# Patient Record
Sex: Female | Born: 1935 | Race: White | Hispanic: No | Smoking: Never smoker
Health system: Southern US, Community
[De-identification: ages and names within clinical notes are randomized; demographics above are authoritative.]

## PROBLEM LIST (undated history)

## (undated) DIAGNOSIS — I4821 Permanent atrial fibrillation: Secondary | ICD-10-CM

## (undated) DIAGNOSIS — K2970 Gastritis, unspecified, without bleeding: Secondary | ICD-10-CM

## (undated) DIAGNOSIS — Z953 Presence of xenogenic heart valve: Secondary | ICD-10-CM

## (undated) DIAGNOSIS — I351 Nonrheumatic aortic (valve) insufficiency: Secondary | ICD-10-CM

## (undated) DIAGNOSIS — IMO0001 Reserved for inherently not codable concepts without codable children: Secondary | ICD-10-CM

## (undated) DIAGNOSIS — I714 Abdominal aortic aneurysm, without rupture, unspecified: Secondary | ICD-10-CM

## (undated) DIAGNOSIS — R079 Chest pain, unspecified: Secondary | ICD-10-CM

## (undated) DIAGNOSIS — C4490 Unspecified malignant neoplasm of skin, unspecified: Secondary | ICD-10-CM

## (undated) DIAGNOSIS — M353 Polymyalgia rheumatica: Secondary | ICD-10-CM

## (undated) DIAGNOSIS — K449 Diaphragmatic hernia without obstruction or gangrene: Secondary | ICD-10-CM

## (undated) DIAGNOSIS — B029 Zoster without complications: Secondary | ICD-10-CM

## (undated) DIAGNOSIS — I671 Cerebral aneurysm, nonruptured: Secondary | ICD-10-CM

## (undated) DIAGNOSIS — I495 Sick sinus syndrome: Secondary | ICD-10-CM

## (undated) DIAGNOSIS — I071 Rheumatic tricuspid insufficiency: Secondary | ICD-10-CM

## (undated) DIAGNOSIS — I5032 Chronic diastolic (congestive) heart failure: Secondary | ICD-10-CM

## (undated) DIAGNOSIS — Z950 Presence of cardiac pacemaker: Secondary | ICD-10-CM

## (undated) DIAGNOSIS — R569 Unspecified convulsions: Secondary | ICD-10-CM

## (undated) DIAGNOSIS — I10 Essential (primary) hypertension: Secondary | ICD-10-CM

## (undated) DIAGNOSIS — F419 Anxiety disorder, unspecified: Secondary | ICD-10-CM

## (undated) DIAGNOSIS — B1910 Unspecified viral hepatitis B without hepatic coma: Secondary | ICD-10-CM

## (undated) DIAGNOSIS — I340 Nonrheumatic mitral (valve) insufficiency: Secondary | ICD-10-CM

## (undated) DIAGNOSIS — I272 Pulmonary hypertension, unspecified: Secondary | ICD-10-CM

## (undated) DIAGNOSIS — K5790 Diverticulosis of intestine, part unspecified, without perforation or abscess without bleeding: Secondary | ICD-10-CM

## (undated) HISTORY — PX: VESICOVAGINAL FISTULA CLOSURE W/ TAH: SUR271

## (undated) HISTORY — PX: CHOLECYSTECTOMY: SHX55

## (undated) HISTORY — DX: Permanent atrial fibrillation: I48.21

## (undated) HISTORY — DX: Presence of xenogenic heart valve: Z95.3

## (undated) HISTORY — PX: ABDOMINAL AORTIC ANEURYSM REPAIR: SUR1152

## (undated) HISTORY — DX: Chronic diastolic (congestive) heart failure: I50.32

## (undated) HISTORY — DX: Unspecified viral hepatitis B without hepatic coma: B19.10

## (undated) HISTORY — DX: Abdominal aortic aneurysm, without rupture: I71.4

## (undated) HISTORY — DX: Unspecified convulsions: R56.9

## (undated) HISTORY — DX: Anxiety disorder, unspecified: F41.9

## (undated) HISTORY — DX: Polymyalgia rheumatica: M35.3

## (undated) HISTORY — PX: CEREBRAL ANEURYSM REPAIR: SHX164

## (undated) HISTORY — PX: OTHER SURGICAL HISTORY: SHX169

## (undated) HISTORY — DX: Unspecified malignant neoplasm of skin, unspecified: C44.90

## (undated) HISTORY — DX: Chest pain, unspecified: R07.9

## (undated) HISTORY — DX: Gastritis, unspecified, without bleeding: K29.70

## (undated) HISTORY — DX: Presence of cardiac pacemaker: Z95.0

## (undated) HISTORY — DX: Zoster without complications: B02.9

## (undated) HISTORY — DX: Essential (primary) hypertension: I10

## (undated) HISTORY — DX: Nonrheumatic mitral (valve) insufficiency: I34.0

## (undated) HISTORY — PX: AORTIC VALVE REPLACEMENT: SHX41

## (undated) HISTORY — DX: Abdominal aortic aneurysm, without rupture, unspecified: I71.40

## (undated) HISTORY — DX: Cerebral aneurysm, nonruptured: I67.1

## (undated) HISTORY — PX: APPENDECTOMY: SHX54

## (undated) HISTORY — DX: Pulmonary hypertension, unspecified: I27.20

## (undated) HISTORY — DX: Rheumatic tricuspid insufficiency: I07.1

## (undated) HISTORY — DX: Sick sinus syndrome: I49.5

## (undated) HISTORY — DX: Diverticulosis of intestine, part unspecified, without perforation or abscess without bleeding: K57.90

## (undated) HISTORY — DX: Nonrheumatic aortic (valve) insufficiency: I35.1

## (undated) HISTORY — DX: Diaphragmatic hernia without obstruction or gangrene: K44.9

## (undated) HISTORY — PX: BREAST BIOPSY: SHX20

## (undated) HISTORY — DX: Reserved for inherently not codable concepts without codable children: IMO0001

---

## 1979-07-19 DIAGNOSIS — B1910 Unspecified viral hepatitis B without hepatic coma: Secondary | ICD-10-CM

## 1979-07-19 HISTORY — DX: Unspecified viral hepatitis B without hepatic coma: B19.10

## 1999-09-06 ENCOUNTER — Encounter: Payer: Self-pay | Admitting: Gastroenterology

## 2000-10-15 ENCOUNTER — Encounter: Payer: Self-pay | Admitting: Gastroenterology

## 2003-11-18 HISTORY — PX: LUNG BIOPSY: SHX232

## 2003-12-13 ENCOUNTER — Encounter: Payer: Self-pay | Admitting: Gastroenterology

## 2004-05-21 ENCOUNTER — Encounter: Payer: Self-pay | Admitting: Gastroenterology

## 2005-02-14 ENCOUNTER — Ambulatory Visit (HOSPITAL_COMMUNITY): Admission: RE | Admit: 2005-02-14 | Discharge: 2005-02-14 | Payer: Self-pay | Admitting: Unknown Physician Specialty

## 2006-05-28 ENCOUNTER — Encounter: Payer: Self-pay | Admitting: Gastroenterology

## 2007-07-26 ENCOUNTER — Ambulatory Visit: Admission: RE | Admit: 2007-07-26 | Discharge: 2007-07-26 | Payer: Self-pay | Admitting: Dermatology

## 2007-08-05 ENCOUNTER — Encounter (HOSPITAL_BASED_OUTPATIENT_CLINIC_OR_DEPARTMENT_OTHER): Admission: RE | Admit: 2007-08-05 | Discharge: 2007-08-17 | Payer: Self-pay | Admitting: Surgery

## 2007-08-18 ENCOUNTER — Ambulatory Visit: Payer: Self-pay | Admitting: Cardiology

## 2007-08-18 ENCOUNTER — Inpatient Hospital Stay (HOSPITAL_COMMUNITY): Admission: EM | Admit: 2007-08-18 | Discharge: 2007-08-23 | Payer: Self-pay | Admitting: Emergency Medicine

## 2007-08-18 HISTORY — PX: PACEMAKER PLACEMENT: SHX43

## 2007-08-20 ENCOUNTER — Ambulatory Visit: Payer: Self-pay | Admitting: Cardiology

## 2007-08-20 ENCOUNTER — Encounter (HOSPITAL_BASED_OUTPATIENT_CLINIC_OR_DEPARTMENT_OTHER): Admission: RE | Admit: 2007-08-20 | Discharge: 2007-11-18 | Payer: Self-pay | Admitting: Surgery

## 2007-08-20 ENCOUNTER — Encounter (INDEPENDENT_AMBULATORY_CARE_PROVIDER_SITE_OTHER): Payer: Self-pay | Admitting: Internal Medicine

## 2007-08-25 ENCOUNTER — Ambulatory Visit: Payer: Self-pay

## 2007-08-30 ENCOUNTER — Ambulatory Visit: Payer: Self-pay | Admitting: Internal Medicine

## 2007-09-06 ENCOUNTER — Ambulatory Visit: Payer: Self-pay | Admitting: Internal Medicine

## 2007-09-13 ENCOUNTER — Ambulatory Visit: Payer: Self-pay | Admitting: Cardiology

## 2007-09-13 ENCOUNTER — Inpatient Hospital Stay (HOSPITAL_COMMUNITY): Admission: EM | Admit: 2007-09-13 | Discharge: 2007-09-17 | Payer: Self-pay | Admitting: Emergency Medicine

## 2007-09-24 ENCOUNTER — Ambulatory Visit: Payer: Self-pay

## 2007-09-25 ENCOUNTER — Encounter: Payer: Self-pay | Admitting: Cardiology

## 2007-09-25 LAB — CONVERTED CEMR LAB
Calcium: 8.8 mg/dL (ref 8.4–10.5)
Glucose, Bld: 58 mg/dL — ABNORMAL LOW (ref 70–99)
Potassium: 4.3 meq/L (ref 3.5–5.3)
Sodium: 142 meq/L (ref 135–145)

## 2007-09-27 ENCOUNTER — Ambulatory Visit: Payer: Self-pay | Admitting: Cardiology

## 2007-10-05 ENCOUNTER — Ambulatory Visit: Payer: Self-pay

## 2007-10-08 ENCOUNTER — Ambulatory Visit: Payer: Self-pay

## 2007-10-18 ENCOUNTER — Ambulatory Visit: Payer: Self-pay | Admitting: Cardiology

## 2007-10-29 ENCOUNTER — Ambulatory Visit: Payer: Self-pay | Admitting: Internal Medicine

## 2007-10-30 ENCOUNTER — Emergency Department (HOSPITAL_COMMUNITY): Admission: EM | Admit: 2007-10-30 | Discharge: 2007-10-30 | Payer: Self-pay | Admitting: Emergency Medicine

## 2007-11-02 ENCOUNTER — Ambulatory Visit: Payer: Self-pay | Admitting: Internal Medicine

## 2007-11-15 ENCOUNTER — Ambulatory Visit: Payer: Self-pay

## 2007-11-22 ENCOUNTER — Ambulatory Visit: Payer: Self-pay | Admitting: Cardiology

## 2007-11-22 ENCOUNTER — Ambulatory Visit: Payer: Self-pay | Admitting: Gastroenterology

## 2007-11-26 ENCOUNTER — Ambulatory Visit: Payer: Self-pay | Admitting: Gastroenterology

## 2007-11-26 ENCOUNTER — Encounter: Payer: Self-pay | Admitting: Gastroenterology

## 2007-11-30 ENCOUNTER — Ambulatory Visit (HOSPITAL_COMMUNITY): Admission: RE | Admit: 2007-11-30 | Discharge: 2007-11-30 | Payer: Self-pay | Admitting: Gastroenterology

## 2007-12-06 ENCOUNTER — Ambulatory Visit: Payer: Self-pay | Admitting: Cardiology

## 2007-12-10 ENCOUNTER — Ambulatory Visit: Payer: Self-pay | Admitting: Internal Medicine

## 2007-12-14 ENCOUNTER — Ambulatory Visit (HOSPITAL_COMMUNITY): Admission: RE | Admit: 2007-12-14 | Discharge: 2007-12-14 | Payer: Self-pay | Admitting: Urology

## 2007-12-17 ENCOUNTER — Ambulatory Visit: Payer: Self-pay | Admitting: Internal Medicine

## 2007-12-20 ENCOUNTER — Ambulatory Visit: Payer: Self-pay | Admitting: Cardiology

## 2007-12-22 ENCOUNTER — Ambulatory Visit: Payer: Self-pay | Admitting: Cardiology

## 2007-12-27 ENCOUNTER — Ambulatory Visit: Payer: Self-pay | Admitting: Gastroenterology

## 2008-01-05 ENCOUNTER — Ambulatory Visit: Payer: Self-pay | Admitting: Cardiology

## 2008-01-14 ENCOUNTER — Ambulatory Visit: Payer: Self-pay | Admitting: Cardiology

## 2008-01-18 ENCOUNTER — Ambulatory Visit: Payer: Self-pay | Admitting: Cardiology

## 2008-01-31 ENCOUNTER — Ambulatory Visit: Payer: Self-pay | Admitting: Internal Medicine

## 2008-02-04 ENCOUNTER — Ambulatory Visit: Payer: Self-pay | Admitting: Cardiology

## 2008-02-17 ENCOUNTER — Ambulatory Visit: Payer: Self-pay | Admitting: Cardiology

## 2008-03-17 ENCOUNTER — Ambulatory Visit: Payer: Self-pay | Admitting: Cardiology

## 2008-03-31 ENCOUNTER — Ambulatory Visit: Payer: Self-pay | Admitting: Cardiology

## 2008-04-14 ENCOUNTER — Ambulatory Visit: Payer: Self-pay | Admitting: Cardiology

## 2008-04-19 ENCOUNTER — Encounter (HOSPITAL_BASED_OUTPATIENT_CLINIC_OR_DEPARTMENT_OTHER): Payer: Self-pay | Admitting: General Surgery

## 2008-04-19 ENCOUNTER — Ambulatory Visit (HOSPITAL_COMMUNITY): Admission: RE | Admit: 2008-04-19 | Discharge: 2008-04-19 | Payer: Self-pay | Admitting: General Surgery

## 2008-05-16 ENCOUNTER — Ambulatory Visit: Payer: Self-pay | Admitting: Cardiology

## 2008-06-01 ENCOUNTER — Ambulatory Visit: Payer: Self-pay | Admitting: Cardiology

## 2008-06-01 LAB — CONVERTED CEMR LAB
BUN: 14 mg/dL (ref 6–23)
CO2: 23 meq/L (ref 19–32)
Chloride: 98 meq/L (ref 96–112)
Eosinophils Relative: 1 % (ref 0–5)
HCT: 39.3 % (ref 36.0–46.0)
INR: 2.7 — ABNORMAL HIGH (ref 0.0–1.5)
Lymphocytes Relative: 22 % (ref 12–46)
Lymphs Abs: 0.9 10*3/uL (ref 0.7–4.0)
MCV: 88.1 fL (ref 78.0–100.0)
Monocytes Relative: 11 % (ref 3–12)
Platelets: 156 10*3/uL (ref 150–400)
Potassium: 4.6 meq/L (ref 3.5–5.3)
Prothrombin Time: 30.9 s — ABNORMAL HIGH (ref 11.6–15.2)
RBC: 4.46 M/uL (ref 3.87–5.11)
WBC: 3.9 10*3/uL — ABNORMAL LOW (ref 4.0–10.5)

## 2008-06-07 ENCOUNTER — Ambulatory Visit: Payer: Self-pay

## 2008-06-13 ENCOUNTER — Ambulatory Visit: Payer: Self-pay | Admitting: Cardiology

## 2008-06-26 ENCOUNTER — Ambulatory Visit: Payer: Self-pay | Admitting: Cardiology

## 2008-07-10 ENCOUNTER — Ambulatory Visit: Payer: Self-pay | Admitting: Internal Medicine

## 2008-07-21 ENCOUNTER — Ambulatory Visit: Payer: Self-pay | Admitting: Cardiology

## 2008-08-02 ENCOUNTER — Other Ambulatory Visit: Payer: Self-pay

## 2008-08-02 ENCOUNTER — Inpatient Hospital Stay: Payer: Self-pay | Admitting: Internal Medicine

## 2008-08-02 ENCOUNTER — Ambulatory Visit: Payer: Self-pay | Admitting: Cardiovascular Disease

## 2008-08-15 ENCOUNTER — Ambulatory Visit: Payer: Self-pay | Admitting: Cardiology

## 2008-08-28 ENCOUNTER — Ambulatory Visit: Payer: Self-pay | Admitting: Internal Medicine

## 2008-09-11 ENCOUNTER — Ambulatory Visit: Payer: Self-pay | Admitting: Cardiology

## 2008-09-14 ENCOUNTER — Ambulatory Visit: Payer: Self-pay | Admitting: Cardiovascular Disease

## 2008-09-18 ENCOUNTER — Ambulatory Visit: Payer: Self-pay | Admitting: Cardiology

## 2008-10-02 ENCOUNTER — Ambulatory Visit: Payer: Self-pay | Admitting: Internal Medicine

## 2008-10-30 ENCOUNTER — Ambulatory Visit: Payer: Self-pay | Admitting: Internal Medicine

## 2008-11-06 ENCOUNTER — Ambulatory Visit: Payer: Self-pay | Admitting: Internal Medicine

## 2008-11-20 ENCOUNTER — Ambulatory Visit: Payer: Self-pay | Admitting: Cardiovascular Disease

## 2008-12-04 ENCOUNTER — Ambulatory Visit: Payer: Self-pay | Admitting: Cardiovascular Disease

## 2008-12-11 ENCOUNTER — Ambulatory Visit: Payer: Self-pay | Admitting: Internal Medicine

## 2008-12-19 ENCOUNTER — Ambulatory Visit: Payer: Self-pay | Admitting: Cardiology

## 2009-01-03 ENCOUNTER — Ambulatory Visit: Payer: Self-pay | Admitting: Internal Medicine

## 2009-01-15 ENCOUNTER — Ambulatory Visit: Payer: Self-pay | Admitting: Cardiovascular Disease

## 2009-01-26 ENCOUNTER — Encounter: Admission: RE | Admit: 2009-01-26 | Discharge: 2009-01-26 | Payer: Self-pay | Admitting: Obstetrics & Gynecology

## 2009-02-06 ENCOUNTER — Ambulatory Visit: Payer: Self-pay | Admitting: Internal Medicine

## 2009-02-22 ENCOUNTER — Encounter: Payer: Self-pay | Admitting: Internal Medicine

## 2009-02-27 ENCOUNTER — Ambulatory Visit: Payer: Self-pay | Admitting: Cardiovascular Disease

## 2009-03-13 ENCOUNTER — Ambulatory Visit: Payer: Self-pay | Admitting: Internal Medicine

## 2009-03-23 ENCOUNTER — Encounter (INDEPENDENT_AMBULATORY_CARE_PROVIDER_SITE_OTHER): Payer: Self-pay | Admitting: *Deleted

## 2009-03-27 ENCOUNTER — Ambulatory Visit: Payer: Self-pay | Admitting: Cardiovascular Disease

## 2009-04-02 ENCOUNTER — Telehealth: Payer: Self-pay | Admitting: Cardiology

## 2009-04-17 ENCOUNTER — Telehealth: Payer: Self-pay | Admitting: Cardiology

## 2009-04-17 ENCOUNTER — Ambulatory Visit: Payer: Self-pay | Admitting: Cardiovascular Disease

## 2009-04-23 ENCOUNTER — Ambulatory Visit: Payer: Self-pay | Admitting: Internal Medicine

## 2009-04-26 ENCOUNTER — Telehealth: Payer: Self-pay | Admitting: Internal Medicine

## 2009-04-26 ENCOUNTER — Emergency Department (HOSPITAL_COMMUNITY): Admission: EM | Admit: 2009-04-26 | Discharge: 2009-04-27 | Payer: Self-pay | Admitting: Emergency Medicine

## 2009-05-07 ENCOUNTER — Ambulatory Visit: Payer: Self-pay | Admitting: Internal Medicine

## 2009-05-22 ENCOUNTER — Ambulatory Visit: Payer: Self-pay | Admitting: Cardiovascular Disease

## 2009-06-19 ENCOUNTER — Ambulatory Visit: Payer: Self-pay | Admitting: Cardiology

## 2009-06-19 DIAGNOSIS — I4891 Unspecified atrial fibrillation: Secondary | ICD-10-CM

## 2009-06-19 DIAGNOSIS — R42 Dizziness and giddiness: Secondary | ICD-10-CM

## 2009-06-27 ENCOUNTER — Encounter: Payer: Self-pay | Admitting: Cardiology

## 2009-07-02 ENCOUNTER — Encounter: Payer: Self-pay | Admitting: *Deleted

## 2009-07-03 ENCOUNTER — Ambulatory Visit: Payer: Self-pay | Admitting: Cardiology

## 2009-07-03 LAB — CONVERTED CEMR LAB: Prothrombin Time: 17.9 s

## 2009-07-17 ENCOUNTER — Ambulatory Visit: Payer: Self-pay | Admitting: Cardiology

## 2009-07-17 ENCOUNTER — Ambulatory Visit: Payer: Self-pay

## 2009-07-17 DIAGNOSIS — I739 Peripheral vascular disease, unspecified: Secondary | ICD-10-CM | POA: Insufficient documentation

## 2009-08-14 ENCOUNTER — Ambulatory Visit: Payer: Self-pay | Admitting: Cardiovascular Disease

## 2009-08-14 LAB — CONVERTED CEMR LAB: POC INR: 2.6

## 2009-09-11 ENCOUNTER — Ambulatory Visit: Payer: Self-pay | Admitting: Cardiology

## 2009-09-11 LAB — CONVERTED CEMR LAB: POC INR: 2.2

## 2009-10-05 ENCOUNTER — Ambulatory Visit: Payer: Self-pay | Admitting: Cardiovascular Disease

## 2009-10-05 ENCOUNTER — Telehealth: Payer: Self-pay | Admitting: Cardiology

## 2009-10-05 ENCOUNTER — Encounter: Payer: Self-pay | Admitting: Cardiology

## 2009-10-09 ENCOUNTER — Ambulatory Visit: Payer: Self-pay | Admitting: Internal Medicine

## 2009-10-09 LAB — CONVERTED CEMR LAB: Pro B Natriuretic peptide (BNP): 187.8 pg/mL — ABNORMAL HIGH (ref 0.0–100.0)

## 2009-10-26 ENCOUNTER — Ambulatory Visit: Payer: Self-pay | Admitting: Internal Medicine

## 2009-10-26 DIAGNOSIS — I5032 Chronic diastolic (congestive) heart failure: Secondary | ICD-10-CM

## 2009-11-01 ENCOUNTER — Encounter: Payer: Self-pay | Admitting: Internal Medicine

## 2009-11-06 ENCOUNTER — Ambulatory Visit: Payer: Self-pay | Admitting: Cardiovascular Disease

## 2009-12-05 ENCOUNTER — Ambulatory Visit: Payer: Self-pay | Admitting: Cardiology

## 2010-01-02 ENCOUNTER — Ambulatory Visit: Payer: Self-pay | Admitting: Cardiovascular Disease

## 2010-01-15 ENCOUNTER — Ambulatory Visit: Payer: Self-pay | Admitting: Cardiovascular Disease

## 2010-01-15 DIAGNOSIS — Z953 Presence of xenogenic heart valve: Secondary | ICD-10-CM | POA: Insufficient documentation

## 2010-01-15 DIAGNOSIS — R011 Cardiac murmur, unspecified: Secondary | ICD-10-CM

## 2010-01-31 ENCOUNTER — Encounter: Payer: Self-pay | Admitting: Cardiovascular Disease

## 2010-01-31 ENCOUNTER — Ambulatory Visit: Payer: Self-pay

## 2010-01-31 ENCOUNTER — Ambulatory Visit: Payer: Self-pay | Admitting: Cardiovascular Disease

## 2010-01-31 LAB — CONVERTED CEMR LAB: POC INR: 3.3

## 2010-02-01 LAB — CONVERTED CEMR LAB
ALT: 15 units/L (ref 0–35)
AST: 24 units/L (ref 0–37)
Albumin: 4.3 g/dL (ref 3.5–5.2)
Calcium: 9.1 mg/dL (ref 8.4–10.5)
Chloride: 97 meq/L (ref 96–112)
Potassium: 3.8 meq/L (ref 3.5–5.3)
Total Protein: 7 g/dL (ref 6.0–8.3)

## 2010-02-28 ENCOUNTER — Ambulatory Visit: Payer: Self-pay | Admitting: Cardiology

## 2010-02-28 LAB — CONVERTED CEMR LAB: POC INR: 2

## 2010-03-13 ENCOUNTER — Encounter: Admission: RE | Admit: 2010-03-13 | Discharge: 2010-03-13 | Payer: Self-pay | Admitting: Obstetrics & Gynecology

## 2010-03-28 ENCOUNTER — Ambulatory Visit: Payer: Self-pay | Admitting: Cardiovascular Disease

## 2010-03-28 LAB — CONVERTED CEMR LAB: POC INR: 2.5

## 2010-04-24 ENCOUNTER — Telehealth: Payer: Self-pay | Admitting: Cardiovascular Disease

## 2010-04-30 ENCOUNTER — Ambulatory Visit: Payer: Self-pay | Admitting: Cardiovascular Disease

## 2010-05-07 ENCOUNTER — Ambulatory Visit: Payer: Self-pay | Admitting: Internal Medicine

## 2010-05-07 DIAGNOSIS — Z950 Presence of cardiac pacemaker: Secondary | ICD-10-CM | POA: Insufficient documentation

## 2010-05-08 ENCOUNTER — Encounter: Payer: Self-pay | Admitting: Internal Medicine

## 2010-05-21 ENCOUNTER — Ambulatory Visit: Payer: Self-pay

## 2010-05-21 ENCOUNTER — Encounter: Payer: Self-pay | Admitting: Internal Medicine

## 2010-05-27 ENCOUNTER — Telehealth: Payer: Self-pay | Admitting: Cardiovascular Disease

## 2010-05-28 ENCOUNTER — Encounter: Payer: Self-pay | Admitting: Cardiovascular Disease

## 2010-05-29 ENCOUNTER — Ambulatory Visit: Payer: Self-pay | Admitting: Cardiovascular Disease

## 2010-05-29 LAB — CONVERTED CEMR LAB: POC INR: 2.5

## 2010-05-31 ENCOUNTER — Encounter: Payer: Self-pay | Admitting: Cardiovascular Disease

## 2010-06-04 ENCOUNTER — Encounter: Payer: Self-pay | Admitting: Cardiovascular Disease

## 2010-06-04 ENCOUNTER — Ambulatory Visit (HOSPITAL_COMMUNITY): Admission: RE | Admit: 2010-06-04 | Discharge: 2010-06-04 | Payer: Self-pay | Admitting: Cardiovascular Disease

## 2010-06-26 ENCOUNTER — Ambulatory Visit: Payer: Self-pay | Admitting: Cardiovascular Disease

## 2010-06-26 LAB — CONVERTED CEMR LAB: POC INR: 2.3

## 2010-07-23 ENCOUNTER — Encounter: Payer: Self-pay | Admitting: Cardiovascular Disease

## 2010-07-23 ENCOUNTER — Ambulatory Visit: Payer: Self-pay | Admitting: Cardiovascular Disease

## 2010-07-23 LAB — CONVERTED CEMR LAB: POC INR: 4.2

## 2010-07-29 ENCOUNTER — Ambulatory Visit: Payer: Self-pay | Admitting: Cardiovascular Disease

## 2010-08-21 ENCOUNTER — Ambulatory Visit: Payer: Self-pay | Admitting: Cardiovascular Disease

## 2010-08-30 ENCOUNTER — Telehealth: Payer: Self-pay | Admitting: Cardiovascular Disease

## 2010-09-18 ENCOUNTER — Ambulatory Visit: Payer: Self-pay | Admitting: Cardiovascular Disease

## 2010-09-20 ENCOUNTER — Telehealth: Payer: Self-pay | Admitting: Cardiovascular Disease

## 2010-10-18 ENCOUNTER — Encounter: Payer: Self-pay | Admitting: Internal Medicine

## 2010-10-18 ENCOUNTER — Ambulatory Visit: Payer: Self-pay | Admitting: Internal Medicine

## 2010-10-18 LAB — CONVERTED CEMR LAB: POC INR: 2

## 2010-11-12 ENCOUNTER — Ambulatory Visit: Payer: Self-pay | Admitting: Cardiovascular Disease

## 2010-11-12 LAB — CONVERTED CEMR LAB: POC INR: 2.1

## 2010-12-08 ENCOUNTER — Encounter: Payer: Self-pay | Admitting: Obstetrics & Gynecology

## 2010-12-11 ENCOUNTER — Ambulatory Visit: Admission: RE | Admit: 2010-12-11 | Discharge: 2010-12-11 | Payer: Self-pay | Source: Home / Self Care

## 2010-12-11 LAB — CONVERTED CEMR LAB: POC INR: 1.9

## 2010-12-15 LAB — CONVERTED CEMR LAB: Prothrombin Time: 20.2 s

## 2010-12-17 ENCOUNTER — Inpatient Hospital Stay (HOSPITAL_COMMUNITY)
Admission: EM | Admit: 2010-12-17 | Discharge: 2010-12-19 | DRG: 069 | Disposition: A | Discharge status: Home Health | Payer: Medicare Other | Attending: Family Medicine | Admitting: Family Medicine

## 2010-12-17 DIAGNOSIS — Z7901 Long term (current) use of anticoagulants: Secondary | ICD-10-CM

## 2010-12-17 DIAGNOSIS — I714 Abdominal aortic aneurysm, without rupture, unspecified: Secondary | ICD-10-CM | POA: Diagnosis present

## 2010-12-17 DIAGNOSIS — I509 Heart failure, unspecified: Secondary | ICD-10-CM | POA: Diagnosis present

## 2010-12-17 DIAGNOSIS — Z950 Presence of cardiac pacemaker: Secondary | ICD-10-CM

## 2010-12-17 DIAGNOSIS — I5032 Chronic diastolic (congestive) heart failure: Secondary | ICD-10-CM | POA: Diagnosis present

## 2010-12-17 DIAGNOSIS — I10 Essential (primary) hypertension: Secondary | ICD-10-CM | POA: Diagnosis present

## 2010-12-17 DIAGNOSIS — G459 Transient cerebral ischemic attack, unspecified: Principal | ICD-10-CM | POA: Diagnosis present

## 2010-12-17 DIAGNOSIS — R4789 Other speech disturbances: Secondary | ICD-10-CM | POA: Diagnosis present

## 2010-12-17 DIAGNOSIS — K219 Gastro-esophageal reflux disease without esophagitis: Secondary | ICD-10-CM | POA: Diagnosis present

## 2010-12-17 DIAGNOSIS — R29810 Facial weakness: Secondary | ICD-10-CM | POA: Diagnosis present

## 2010-12-17 DIAGNOSIS — I4891 Unspecified atrial fibrillation: Secondary | ICD-10-CM | POA: Diagnosis present

## 2010-12-17 DIAGNOSIS — I83009 Varicose veins of unspecified lower extremity with ulcer of unspecified site: Secondary | ICD-10-CM | POA: Diagnosis present

## 2010-12-17 DIAGNOSIS — Z8619 Personal history of other infectious and parasitic diseases: Secondary | ICD-10-CM

## 2010-12-17 DIAGNOSIS — I059 Rheumatic mitral valve disease, unspecified: Secondary | ICD-10-CM | POA: Diagnosis present

## 2010-12-17 LAB — COMPREHENSIVE METABOLIC PANEL
AST: 29 U/L (ref 0–37)
Albumin: 3.7 g/dL (ref 3.5–5.2)
Alkaline Phosphatase: 101 U/L (ref 39–117)
BUN: 22 mg/dL (ref 6–23)
Chloride: 100 mEq/L (ref 96–112)
Potassium: 5 mEq/L (ref 3.5–5.1)
Total Bilirubin: 0.6 mg/dL (ref 0.3–1.2)

## 2010-12-17 LAB — URINALYSIS, ROUTINE W REFLEX MICROSCOPIC
Bilirubin Urine: NEGATIVE
Hgb urine dipstick: NEGATIVE
Ketones, ur: NEGATIVE mg/dL
Protein, ur: NEGATIVE mg/dL
Urine Glucose, Fasting: NEGATIVE mg/dL

## 2010-12-17 LAB — PROTIME-INR: INR: 2.46 — ABNORMAL HIGH (ref 0.00–1.49)

## 2010-12-17 LAB — CBC
HCT: 41.8 % (ref 36.0–46.0)
Hemoglobin: 13.6 g/dL (ref 12.0–15.0)
MCHC: 32.5 g/dL (ref 30.0–36.0)

## 2010-12-17 LAB — URINE MICROSCOPIC-ADD ON

## 2010-12-17 NOTE — Medication Information (Signed)
Summary: ccr/at  Anticoagulant Therapy  Managed by: Cloyde Reams, RN, BSN Referring MD: Sherryl Manges PCP: Victorino December MD: Mariah Milling Indication 1: Aortic Valve Replacement (ICD-V43.3) Indication 2: Atrial Fibrillation (ICD-427.31) Lab Used: [facility] Site: [place] INR RANGE 2.0-3.0  Dietary changes: no     Bleeding/hemorrhagic complications: no     Any changes in medication regimen? yes       Details: zpack finished 6/10   Any missed doses?: no       Is patient compliant with meds? yes       Allergies: 1)  ! Iodine 2)  ! Pcn 3)  ! * Latex Tape 4)  ! Sulfa 5)  ! Verapamil 6)  ! * Diltiazem 7)  ! Lasix 8)  ! * Advair  Anticoagulation Management History:      The patient is taking warfarin and comes in today for a routine follow up visit.  Positive risk factors for bleeding include an age of 75 years or older.  The bleeding index is 'intermediate risk'.  Positive CHADS2 values include History of CHF and Age > 75 years old.  The start date was 08/19/2007.  Her last INR was 2.7.  Anticoagulation responsible provider: Anson Peddie.  Cuvette Lot#: [id number].  Exp: 07/2011.    Anticoagulation Management Assessment/Plan:      The patient's current anticoagulation dose is Warfarin sodium 5 mg tabs: Take 1 tablet by mouth once a day.  The target INR is 2 - 3.  The next INR is due 05/28/2010.  Anticoagulation instructions were given to patient.  Results were reviewed/authorized by Cloyde Reams, RN, BSN.  She was notified by Cloyde Reams RN.         Prior Anticoagulation Instructions: INR 2.5  Continue on same dosage 2.5mg  daily except 5mg  on Mondays, Fridays, and Saturdays.  Recheck in 4 weeks.    Current Anticoagulation Instructions: INR 2.3  Continue taking 0.5 tab daily except 1 tab on Monday Friday and Saturday. Recheck in 4 weeks

## 2010-12-17 NOTE — Progress Notes (Signed)
Summary: verapamil for insurance  Phone Note Call from Patient   Caller: Patient Call For: [phone] Summary of Call: Needs notes where she was taking Verapamil from 2008 - 2009 for insurance purposes. Initial call taken by: Bishop Dublin, CMA,  August 30, 2010 2:51 PM  Follow-up for Phone Call        Notified patient records regarding verapamil are ready for pick up.  Told she needs to sign a medical release first. Follow-up by: Bishop Dublin, CMA,  September 06, 2010 9:16 AM

## 2010-12-17 NOTE — Medication Information (Signed)
Summary: rov/ewj  Anticoagulant Therapy  Managed by: Cloyde Reams, RN, BSN Referring MD: Sherryl Manges PCP: Victorino December MD: Mariah Milling Indication 1: Aortic Valve Replacement (ICD-V43.3) Indication 2: Atrial Fibrillation (ICD-427.31) Lab Used: [facility] Site: [place] INR POC 2.6 INR RANGE 2.0-3.0  Dietary changes: no    Health status changes: no    Bleeding/hemorrhagic complications: no    Recent/future hospitalizations: no    Any changes in medication regimen? no    Recent/future dental: no  Any missed doses?: no       Is patient compliant with meds? yes       Allergies: 1)  ! Iodine 2)  ! Pcn 3)  ! * Latex Tape 4)  ! Sulfa 5)  ! Verapamil 6)  ! * Diltiazem 7)  ! Lasix 8)  ! * Advair  Anticoagulation Management History:      The patient is taking warfarin and comes in today for a routine follow up visit.  Positive risk factors for bleeding include an age of 75 years or older.  The bleeding index is 'intermediate risk'.  Positive CHADS2 values include History of CHF and Age > 74 years old.  The start date was 08/19/2007.  Her last INR was 2.7.  Anticoagulation responsible provider: Gollan.  INR POC: 2.6.  Cuvette Lot#: [id number].  Exp: 08/2011.    Anticoagulation Management Assessment/Plan:      The patient's current anticoagulation dose is Warfarin sodium 5 mg tabs: as directed.  The target INR is 2 - 3.  The next INR is due 10/16/2010.  Anticoagulation instructions were given to patient.  Results were reviewed/authorized by Cloyde Reams, RN, BSN.  She was notified by Cloyde Reams RN.         Prior Anticoagulation Instructions: INR 2.1  Continue on same dosage 1/2 tablet daily except 1 tablet on Mondays and Saturdays.  Recheck in 4 weeks.   Current Anticoagulation Instructions: INR 2.6  Continue on same dosage 1/2 tablet daily except 1 tablet on Mondays and Saturdays.  Recheck in 4 weeks.

## 2010-12-17 NOTE — Medication Information (Signed)
Summary: CCR/AMD  Anticoagulant Therapy  Managed by: Robyn Haber, RN, BSN Referring MD: Sherryl Manges PCP: Victorino December MD: Shirlee Latch MD, [place] Indication 1: Aortic Valve Replacement (ICD-V43.3) Indication 2: Atrial Fibrillation (ICD-427.31) Lab Used: [facility] Site: [place] INR POC 2.1  Dietary changes: no    Health status changes: no    Bleeding/hemorrhagic complications: no    Recent/future hospitalizations: no    Any changes in medication regimen? no    Recent/future dental: no  Any missed doses?: no       Is patient compliant with meds? yes       Allergies: 1)  ! Iodine 2)  ! Pcn 3)  ! * Latex Tape 4)  ! Sulfa 5)  ! Verapamil 6)  ! * Diltiazem 7)  ! Lasix 8)  ! * Advair  Anticoagulation Management History:      The patient is taking warfarin and comes in today for a routine follow up visit.  Positive risk factors for bleeding include an age of 75 years or older.  The bleeding index is 'intermediate risk'.  Positive CHADS2 values include History of CHF and Age > 63 years old.  The start date was 08/19/2007.  Her last INR was 2.7.  Anticoagulation responsible provider: Shirlee Latch MD, [place].  INR POC: 2.1.    Anticoagulation Management Assessment/Plan:      The patient's current anticoagulation dose is Warfarin sodium 5 mg tabs: Take 1 tablet by mouth once a day.  The target INR is 2 - 3.  The next INR is due 01/02/2010.  Anticoagulation instructions were given to patient.  Results were reviewed/authorized by Robyn Haber, RN, BSN.  She was notified by Charlena Cross, RN, BSN.         Prior Anticoagulation Instructions: The patient is to continue with the same dose of coumadin.  This dosage includes: coumadin 2.5 mg daily with 5 mg on M F Sa  Current Anticoagulation Instructions: Same as Prior Instructions.

## 2010-12-17 NOTE — Assessment & Plan Note (Signed)
Summary: [name]   Visit Type:  Follow-up Referring Provider:  wall Primary Provider:  Ellsworth Lennox  CC:  c/o shortness of breath for several weeks.[name]  History of Present Illness: 77 yo WF with history of tachy-brady syndrome s/p pacemaker, Permanent atrial fibrillation, HTN, AVR, AAA with repair 4-5 years ago, and chronic diastolic heart failure.  Ultrasound from last year showing a significant decrease in her right brachial arterial pressure compared to the left.  She denies any significant lower extremity edema, no significant shortness of breath. her gait is unsteady and she uses a cane. She otherwise feels well with no new symptoms of lightheadedness. she does not exercise very much. She did participate in water aerobics for 20 years but has not been doing this recently.  cardiac catheterization over 5 years ago showing no significant coronary artery disease,  recent echo showing mild aortic stenosis with intact aortic valve, bioprosthetic valve, mild pulmonary hypertension  recent CT scan showing stable descending aorta and after repair, minimal dilatation of the descending aorta, stable-appearing aortic valve.  EKG shows atrial fibrillation with PVCs, biventricular block  Current Medications (verified): 1)  Digoxin 0.125 Mg Tabs (Digoxin) .... Take One Tablet By Mouth Daily 2)  Warfarin Sodium 5 Mg Tabs (Warfarin Sodium) .... As Directed 3)  Atenolol 100 Mg Tabs (Atenolol) .... Take One Tablet By Mouth A Day 4)  Omeprazole 40 Mg Cpdr (Omeprazole) .[name].. 1 By Mouth Two Times A Day 5)  Triamterene-Hctz 37.5-25 Mg Tabs (Triamterene-Hctz) .... Once Daily 6)  Lorazepam 0.5 Mg Tabs (Lorazepam) .... Three Times A Day As Needed 7)  Eye Vitamins  Caps (Multiple Vitamins-Minerals) .... Once Daily  Allergies (verified): 1)  ! Iodine 2)  ! Pcn 3)  ! * Latex Tape 4)  ! Sulfa 5)  ! Verapamil 6)  ! * Diltiazem 7)  ! Lasix 8)  ! * Advair  Past History:  Past Medical History: Last  updated: 01/22/2010  1. Severe bradycardia in the setting of permanent atrial fibrillation       with tachybrady syndrome status post single-chamber Medtronic       pacemaker placement October of 2008.   2. History of brain aneurysm.   3. Hypertension.   4. Thoracic aneurysm.   5. AAA status post resection.   6. Aortic valve replacement with bioprosthetic valve in the [facility] prior to moving here.   7. The patient also has seizures.   8. Diastolic heart failure   Diverticulosis Hemorrhoids Gastritis  Past Surgical History: Last updated: 01/22/2010  1. Cerebral aneurysm repair.   2. AAA resection.   3. Aortic valve replacement with bioprosthetic valve and also       Medtronic pacemaker placed October of 2008. 1. Atrial fibrillation - Permanent.   2. Status post pacemaker for bradycardia with now well controlled       ventricular response.   3. Digoxin therapy, question level.   4. Lower extremity venous issues. Breast Biopsy Cholecystectomy Hysterectomy  Family History: Last updated: 12/06/2008 Unremarkable  Social History: Last updated: 12/06/2008 Retired  Married  Tobacco Use - No.  Alcohol Use - no  Risk Factors: Smoking Status: never (12/06/2008)  Review of Systems       The patient complains of difficulty walking.  The patient denies fever, weight loss, weight gain, vision loss, decreased hearing, hoarseness, chest pain, syncope, dyspnea on exertion, peripheral edema, prolonged cough, abdominal pain, incontinence, muscle weakness, depression, and enlarged lymph nodes.  Vital Signs:  Patient profile:   77 year old female Height:      64 inches Weight:      147 pounds BMI:     25.32 Pulse rate:   84 / minute BP sitting:   131 / 72  (left arm) Cuff size:   regular  Vitals Entered By: Bishop Dublin, CMA (July 23, 2010 2:45 PM)  Physical Exam  General:  The patient was alert and oriented in no acute distress. HEENT Normal.  Neck  veins were flat, carotids were brisk.  Lungs were clear.  Heart sounds were regular with2/6 murmur Abdomen was soft with active bowel sounds And a reducible,abdominal wall hernia There is no clubbing cyanosis or edema. Skin Warm and dry  Neurologic:  Alert and oriented x 3. Psych:  Normal affect.   PPM Specifications Following MD:  Sherryl Manges, MD     PPM Vendor:  Medtronic     PPM Model Number:  [id number]     PPM Serial Number:  [id number] PPM DOI:  08/20/2007      Lead 1    Location: RV     DOI: 08/20/2007     Model #: [id number]     Serial #: [id number]     Status: active   Indications:  TACHY/BRADY SYNDROME AFIB   PPM Follow Up Pacer Dependent:  No      Episodes Coumadin:  Yes  Parameters Mode:  VVIR     Lower Rate Limit:  60     Impression & Recommendations:  Problem # 1:  AORTIC VALVE DISORDERS (ICD-424.1) bioprosthetic aortic valve is well seated on her recent echocardiogram. No further workup at this time.Also with mild aortic valve regurgitation.  Her updated medication list for this problem includes:    Digoxin 0.125 Mg Tabs (Digoxin) .[name]... Take one tablet by mouth daily    Atenolol 100 Mg Tabs (Atenolol) .[name]... Take one tablet by mouth a day    Triamterene-hctz 37.5-25 Mg Tabs (Triamterene-hctz) ..... Once daily  Problem # 2:  ATRIAL FIBRILLATION (ICD-427.31) chronic atrial fibrillation on warfarin. Pacemaker, followed by Dr. Graciela Husbands Her updated medication list for this problem includes:    Digoxin 0.125 Mg Tabs (Digoxin) .[name]... Take one tablet by mouth daily    Warfarin Sodium 5 Mg Tabs (Warfarin sodium) .[name]... As directed    Atenolol 100 Mg Tabs (Atenolol) .[name]... Take one tablet by mouth a day  Problem # 3:  DIASTOLIC HEART FAILURE, CHRONIC (ICD-428.32) no symptoms of heart failure at this time. We have not made any significant medication changes.  Her updated medication list for this problem includes:    Digoxin 0.125 Mg Tabs (Digoxin) .[name]... Take one tablet by mouth  daily    Warfarin Sodium 5 Mg Tabs (Warfarin sodium) .[name]... As directed    Atenolol 100 Mg Tabs (Atenolol) .[name]... Take one tablet by mouth a day    Triamterene-hctz 37.5-25 Mg Tabs (Triamterene-hctz) ..... Once daily  Problem # 4:  CAROTID ARTERY DISEASE (ICD-433.10) She has a patent carotid arteries. She does have a significant drop in pressure in her right brachial artery compared to the left. We will discuss whether she is having any symptoms in her right arm with her and whether she requires repeat ultrasound as the last was done last year.  Her updated medication list for this problem includes:    Warfarin Sodium 5 Mg Tabs (Warfarin sodium) .[name]... As directed  Other Orders: EKG w/ Interpretation (93000)  Patient Instructions: 1)  Your physician recommends that you continue on your current medications as directed. Please refer to the Current Medication list given to you today. 2)  Your physician wants you to follow-up in:  1 year  You will receive a reminder letter in the mail two months in advance. If you don't receive a letter, please call our office to schedule the follow-up appointment.

## 2010-12-17 NOTE — Medication Information (Signed)
Summary: [facility]  Anticoagulant Therapy  Managed by: Robyn Haber, RN, BSN Referring MD: Sherryl Manges PCP: Victorino December MD: Mariah Milling Indication 1: Aortic Valve Replacement (ICD-V43.3) Indication 2: Atrial Fibrillation (ICD-427.31) Lab Used: [facility]--[place] Site: [place] INR POC 3.3 INR RANGE 2.0-3.0  Dietary changes: no    Health status changes: no    Bleeding/hemorrhagic complications: no    Recent/future hospitalizations: no    Any changes in medication regimen? no    Recent/future dental: no  Any missed doses?: no       Is patient compliant with meds? yes       Allergies: 1)  ! Iodine 2)  ! Pcn 3)  ! * Latex Tape 4)  ! Sulfa 5)  ! Verapamil 6)  ! * Diltiazem 7)  ! Lasix 8)  ! * Advair  Anticoagulation Management History:      The patient is taking warfarin and comes in today for a routine follow up visit.  Positive risk factors for bleeding include an age of 75 years or older.  The bleeding index is 'intermediate risk'.  Positive CHADS2 values include History of CHF and Age > 71 years old.  The start date was 08/19/2007.  Her last INR was 2.7.  Anticoagulation responsible provider: Gollan.  INR POC: 3.3.    Anticoagulation Management Assessment/Plan:      The patient's current anticoagulation dose is Warfarin sodium 5 mg tabs: Take 1 tablet by mouth once a day.  The target INR is 2 - 3.  The next INR is due 02/28/2010.  Anticoagulation instructions were given to patient.  Results were reviewed/authorized by Robyn Haber, RN, BSN.  She was notified by Charlena Cross, RN, BSN.         Prior Anticoagulation Instructions: The patient is to continue with the same dose of coumadin.  This dosage includes: coumadin 2.5 mg daily with 5 mg on M F Sa  Current Anticoagulation Instructions: no coumadin today then resume dose of 2.5 mg on 5 mg M F Sa

## 2010-12-17 NOTE — Assessment & Plan Note (Signed)
Summary: F6M/PACEMAKER/AMD   Visit Type:  Follow-up Referring Provider:  wall Primary Provider:  Ellsworth Lennox  CC:  swelling in legs, feet and ankles, and s.o.b. and fatigue. [name]  History of Present Illness: [name] is seen in followup for her pacemaker implanted for tachybrady syndrome in the setting of what is now permanent atrial fibrillation.  per major complaint is of fatigue. She tells her that her husband died after a long illness 3 months ago. She is sleeping well at night he well and admits to being quite depressed    Review of the old chart, demonstrated that she washospitalized in September 2009 for similar complaints and the evaluationwas felt to be consistent with diastolic dysfunction.  She has had  undergone catheterization about 5 years before that, which was notablefor normal obstructive coronary artery disease, moderate pulmonaryhypertension, moderate mitral insufficiency, moderate to severe dilatation of the ascending aorta, and it was felt that diastolic dysfunction was responsible.    echo cardiogram in March of this year demonstrated normal left ventricular function. Digoxin level last December was normal-low. Other parameters were normal in March.  Current Medications (verified): 1)  Digoxin 0.125 Mg Tabs (Digoxin) .... Take One Tablet By Mouth Daily 2)  Warfarin Sodium 5 Mg Tabs (Warfarin Sodium) .... As Directed 3)  Atenolol 100 Mg Tabs (Atenolol) .... Take One Tablet By Mouth A Day 4)  Omeprazole 40 Mg Cpdr (Omeprazole) .[name].. 1 By Mouth Two Times A Day 5)  Triamterene-Hctz 37.5-25 Mg Tabs (Triamterene-Hctz) .... Once Daily 6)  Lorazepam 0.5 Mg Tabs (Lorazepam) .... Three Times A Day As Needed 7)  Eye Vitamins  Caps (Multiple Vitamins-Minerals) .... Once Daily  Allergies (verified): 1)  ! Iodine 2)  ! Pcn 3)  ! * Latex Tape 4)  ! Sulfa 5)  ! Verapamil 6)  ! * Diltiazem 7)  ! Lasix 8)  ! * Advair  Vital Signs:  Patient profile:   77 year old female Height:       64 inches Weight:      146 pounds BMI:     25.15 Pulse rate:   80 / minute BP sitting:   122 / 62  (left arm) Cuff size:   regular  Vitals Entered By: Bishop Dublin, CMA (May 07, 2010 2:23 PM)  Physical Exam  General:  The patient was alert and oriented in no acute distress. HEENT Normal.  Neck veins were flat, carotids were brisk.  Lungs were clear.  Heart sounds were regular with2/6 murmur  Abdomen was soft with active bowel sounds And a reducible,abfominal wall hernia There is no clubbing cyanosis or edema. Skin Warm and dry    PPM Specifications Following MD:  Sherryl Manges, MD     PPM Vendor:  Medtronic     PPM Model Number:  [id number]     PPM Serial Number:  [id number] PPM DOI:  08/20/2007      Lead 1    Location: RV     DOI: 08/20/2007     Model #: [id number]     Serial #: [id number]     Status: active   Indications:  TACHY/BRADY SYNDROME AFIB   PPM Follow Up Remote Check?  No Battery Voltage:  2.77 V     Battery Est. Longevity:  6.5 years     Pacer Dependent:  No     Right Ventricle  Amplitude: 22.40 mV, Impedance: 336 ohms, Threshold: 0.625 V at 0.4 msec  Episodes Coumadin:  Yes Ventricular Pacing:  61.3%  Parameters Mode:  VVIR     Lower Rate Limit:  60     Next Cardiology Appt Due:  10/17/2010 Tech Comments:  No parameter changes.  A-fib, ventricular rate controlled, + coumadin.  Ms. [name] c/o SOB and fatigue.  She prefers office visits to [facility].  ROV 6 months [place] [facility]. Altha Harm, LPN  May 07, 2010 2:43 PM   Impression & Recommendations:  Problem # 1:  ATRIAL FIBRILLATION (ICD-427.31)  A. rate controlled appropriately on anticoagulation Her updated medication list for this problem includes:    Digoxin 0.125 Mg Tabs (Digoxin) .[name]... Take one tablet by mouth daily    Warfarin Sodium 5 Mg Tabs (Warfarin sodium) .[name]... As directed    Atenolol 100 Mg Tabs (Atenolol) .[name]... Take one tablet by mouth a day  Her updated medication list for this  problem includes:    Digoxin 0.125 Mg Tabs (Digoxin) .[name]... Take one tablet by mouth daily    Warfarin Sodium 5 Mg Tabs (Warfarin sodium) .[name]... As directed    Atenolol 100 Mg Tabs (Atenolol) .[name]... Take one tablet by mouth a day  Problem # 2:  DIASTOLIC HEART FAILURE, CHRONIC (ICD-428.32)  stable Her updated medication list for this problem includes:    Digoxin 0.125 Mg Tabs (Digoxin) .[name]... Take one tablet by mouth daily    Warfarin Sodium 5 Mg Tabs (Warfarin sodium) .[name]... As directed    Atenolol 100 Mg Tabs (Atenolol) .[name]... Take one tablet by mouth a day    Triamterene-hctz 37.5-25 Mg Tabs (Triamterene-hctz) ..... Once daily  Her updated medication list for this problem includes:    Digoxin 0.125 Mg Tabs (Digoxin) .[name]... Take one tablet by mouth daily    Warfarin Sodium 5 Mg Tabs (Warfarin sodium) .[name]... As directed    Atenolol 100 Mg Tabs (Atenolol) .[name]... Take one tablet by mouth a day    Triamterene-hctz 37.5-25 Mg Tabs (Triamterene-hctz) ..... Once daily  Problem # 3:  PACEMAKER, PERMANENT (ICD-V45.01) Device parameters and data were reviewed and no changes were made  Problem # 4:  ABDOMINAL AORTIC ANEURYSM (ICD-441.4) tpatient says it is in a year or so since her aneurysm was reviewed. We will repeat an abdominal ultrasound. The old chart describes a AAA repair. She says that was done at the time of her aortic valve surgery was the resection and/or repair of a "aortic aneurysm which I infer refers to an ascending aortic issue  Other Orders: Abdominal Aorta Duplex (Abd Aorta Duplex)  Patient Instructions: 1)  Your physician wants you to follow-up in:  6 months with Alisia Ferrari will receive a reminder letter in the mail two months in advance. If you don't receive a letter, please call our office to schedule the follow-up appointment. 2)  Your physician recommends that you continue on your current medications as directed. Please refer to the Current Medication list given to you today. 3)   Your physician has requested that you have an abdominal aorta duplex. During this test, an ultrasound is used to evaluate the aorta. Allow 30 minutes for this exam. Do not eat after midnight the day before and avoid carbonated beverages. There are no restrictions or special instructions.

## 2010-12-17 NOTE — Medication Information (Signed)
Summary: [facility] Clinic  Anticoagulant Therapy  Managed by: Cloyde Reams, RN, BSN Referring MD: Sherryl Manges PCP: Victorino December MD: Mariah Milling Indication 1: Aortic Valve Replacement (ICD-V43.3) Indication 2: Atrial Fibrillation (ICD-427.31) Lab Used: [facility]--[place] Site: [place] INR POC 4.2 INR RANGE 2.0-3.0  Dietary changes: no     Bleeding/hemorrhagic complications: no     Any changes in medication regimen? no     Any missed doses?: no       Is patient compliant with meds? yes       Allergies: 1)  ! Iodine 2)  ! Pcn 3)  ! * Latex Tape 4)  ! Sulfa 5)  ! Verapamil 6)  ! * Diltiazem 7)  ! Lasix 8)  ! * Advair  Anticoagulation Management History:      The patient is taking warfarin and comes in today for a routine follow up visit.  Positive risk factors for bleeding include an age of 75 years or older.  The bleeding index is 'intermediate risk'.  Positive CHADS2 values include History of CHF and Age > 25 years old.  The start date was 08/19/2007.  Her last INR was 2.7.  Anticoagulation responsible provider: Gollan.  INR POC: 4.2.  Exp: 08/2011.    Anticoagulation Management Assessment/Plan:      The patient's current anticoagulation dose is Warfarin sodium 5 mg tabs: as directed.  The target INR is 2 - 3.  The next INR is due 07/31/2010.  Anticoagulation instructions were given to patient.  Results were reviewed/authorized by Cloyde Reams, RN, BSN.  She was notified by Benedict Needy, RN.         Prior Anticoagulation Instructions: INR 2.3  Continue on same dosage 1/2 tablet daily except 1 tablet on Mondays, Fridays, and Saturdays.  Recheck in 4 weeks.    Current Anticoagulation Instructions: INR 4.2  DO NOT TAKE COUMADIN TODAY. Start takin 1/2 tab daily except for 1 tab on Monday and Saturday. Recheck in 1 week.

## 2010-12-17 NOTE — Procedures (Signed)
Summary: Colonoscopy/[facility]  Colonoscopy/[facility]   Imported By: Sherian Rein 01/23/2010 11:27:52  _____________________________________________________________________  External Attachment:    Type:   Image     Comment:   External Document

## 2010-12-17 NOTE — Miscellaneous (Signed)
Summary: Orders Update  Clinical Lists Changes  Orders: Added new Referral order of CT Scan  (CT Scan) - Signed  Appended Document: Orders Update Called and spoke to pt's neice she is aware of instructions.  pt to come in 05/29/10 for coumadin check will give written instructions then.   Clinical Lists Changes  Medications: Added new medication of PREDNISONE 20 MG TABS (PREDNISONE) Take 3  tablet by mouth once a day for 3 days - Signed Rx of PREDNISONE 20 MG TABS (PREDNISONE) Take 3  tablet by mouth once a day for 3 days;  #9 x 0;  Signed;  Entered by: Benedict Needy, RN;  Authorized by: Dossie Arbour MD;  Method used: Electronically to [facility]*, [address], [place], [place]  [address], Ph: [phone], Fax: [phone]    Prescriptions: PREDNISONE 20 MG TABS (PREDNISONE) Take 3  tablet by mouth once a day for 3 days  #9 x 0   Entered by:   Benedict Needy, RN   Authorized by:   Dossie Arbour MD   Signed by:   Benedict Needy, RN on 05/28/2010   Method used:   Electronically to        [facility]* (retail)       [address]       [place], [place]  [address]       Ph: [phone]       Fax: [phone]   RxID:   [id number]

## 2010-12-17 NOTE — Medication Information (Signed)
Summary: CCR/NE  Anticoagulant Therapy  Managed by: Cloyde Reams, RN, BSN Referring MD: Sherryl Manges PCP: Victorino December MD: Mariah Milling Indication 1: Aortic Valve Replacement (ICD-V43.3) Indication 2: Atrial Fibrillation (ICD-427.31) Lab Used: [facility] Site: [place] INR POC 2.3 INR RANGE 2.0-3.0  Dietary changes: no    Health status changes: no    Bleeding/hemorrhagic complications: no    Recent/future hospitalizations: no    Any changes in medication regimen? no    Recent/future dental: no  Any missed doses?: no       Is patient compliant with meds? yes       Allergies: 1)  ! Iodine 2)  ! Pcn 3)  ! * Latex Tape 4)  ! Sulfa 5)  ! Verapamil 6)  ! * Diltiazem 7)  ! Lasix 8)  ! * Advair  Anticoagulation Management History:      The patient is taking warfarin and comes in today for a routine follow up visit.  Positive risk factors for bleeding include an age of 75 years or older.  The bleeding index is 'intermediate risk'.  Positive CHADS2 values include History of CHF and Age > 75 years old.  The start date was 08/19/2007.  Her last INR was 2.7.  Anticoagulation responsible provider: Gollan.  INR POC: 2.3.  Cuvette Lot#: [id number].  Exp: 08/2011.    Anticoagulation Management Assessment/Plan:      The patient's current anticoagulation dose is Warfarin sodium 5 mg tabs: as directed.  The target INR is 2 - 3.  The next INR is due 07/24/2010.  Anticoagulation instructions were given to patient.  Results were reviewed/authorized by Cloyde Reams, RN, BSN.  She was notified by Cloyde Reams RN.         Prior Anticoagulation Instructions: INR 2.5  Continue on same dosage 1/2 tablet daily except 1 tablet on Mondays, Friday, and Saturdays.  Recheck in 4 weeks.    Current Anticoagulation Instructions: INR 2.3  Continue on same dosage 1/2 tablet daily except 1 tablet on Mondays, Fridays, and Saturdays.  Recheck in 4 weeks.

## 2010-12-17 NOTE — Medication Information (Signed)
Summary: CCR/AMD  Anticoagulant Therapy  Managed by: Robyn Haber, RN, BSN Referring MD: Sherryl Manges PCP: Victorino December MD: Shirlee Latch MD, Javia Dillow Indication 1: Aortic Valve Replacement (ICD-V43.3) Indication 2: Atrial Fibrillation (ICD-427.31) Lab Used: [facility] Site: [place] INR POC 2.0 INR RANGE 2.0-3.0  Dietary changes: no    Health status changes: no    Bleeding/hemorrhagic complications: no    Recent/future hospitalizations: no    Any changes in medication regimen? no    Recent/future dental: no  Any missed doses?: no       Is patient compliant with meds? yes       Allergies: 1)  ! Iodine 2)  ! Pcn 3)  ! * Latex Tape 4)  ! Sulfa 5)  ! Verapamil 6)  ! * Diltiazem 7)  ! Lasix 8)  ! * Advair  Anticoagulation Management History:      The patient is taking warfarin and comes in today for a routine follow up visit.  Positive risk factors for bleeding include an age of 75 years or older.  The bleeding index is 'intermediate risk'.  Positive CHADS2 values include History of CHF and Age > 63 years old.  The start date was 08/19/2007.  Her last INR was 2.7.  Anticoagulation responsible provider: Shirlee Latch MD, Aryianna Earwood.  INR POC: 2.0.    Anticoagulation Management Assessment/Plan:      The patient's current anticoagulation dose is Warfarin sodium 5 mg tabs: Take 1 tablet by mouth once a day.  The target INR is 2 - 3.  The next INR is due 03/28/2010.  Anticoagulation instructions were given to patient.  Results were reviewed/authorized by Robyn Haber, RN, BSN.  She was notified by Charlena Cross, RN, BSN.         Prior Anticoagulation Instructions: no coumadin today then resume dose of 2.5 mg on 5 mg M F Sa  Current Anticoagulation Instructions: The patient is to continue with the same dose of coumadin.  This dosage includes: coumadin 2.5 mg daily with 5 mg on M F Sa

## 2010-12-17 NOTE — Assessment & Plan Note (Signed)
Summary: EC6/AMD   Visit Type:  ec6 Referring Provider:  wall Primary Provider:  Ellsworth Lennox  CC:  under lots of stress due to husband sick. occassional chest pain, not much shortness of breath, and but don't do much to over do it. No edema in ankles and feet. [name]  History of Present Illness: 77 yo WF with history of tachy-brady syndrome s/p pacemaker, HTN, AVR, AAA and chronic diastolic heart failure.    overall, Mr. [name] states that she's been doing well. She has been stressed as her husband has been sick with myelodysplastic disorder. He has been needing blood transfusions and has IVs in place that need care. She is very active, does not sit for very long though she states that her balance is not good. Occasionally she uses a cane.  She denies any significant lower extremity edema, no significant shortness of breath.  Current Problems (verified): 1)  Atrial Fibrillation  (ICD-427.31) 2)  Diastolic Heart Failure, Chronic  (ICD-428.32) 3)  Dizziness and Giddiness  (ICD-780.4) 4)  Pvd  (ICD-443.9) 5)  Abdominal Aortic Aneurysm  (ICD-441.4) 6)  Carotid Artery Disease  (ICD-433.10)  Current Medications (verified): 1)  Digoxin 0.125 Mg Tabs (Digoxin) .... Take One Tablet By Mouth Daily 2)  Warfarin Sodium 5 Mg Tabs (Warfarin Sodium) .... Take 1 Tablet By Mouth Once A Day 3)  Atenolol 100 Mg Tabs (Atenolol) .... Take One Tablet By Mouth A Day 4)  Omeprazole 40 Mg Cpdr (Omeprazole) .[name].. 1 By Mouth Two Times A Day 5)  Triamterene-Hctz 37.5-25 Mg Tabs (Triamterene-Hctz) .... Once Daily 6)  Lorazepam 0.5 Mg Tabs (Lorazepam) .... Two Times A Day 7)  Triamcinolone Acetonide 0.1 % Crea (Triamcinolone Acetonide) .... At Bedtime  and As Needed 8)  Eye Vitamins  Caps (Multiple Vitamins-Minerals) .... Once Daily  Allergies (verified): 1)  ! Iodine 2)  ! Pcn 3)  ! * Latex Tape 4)  ! Sulfa 5)  ! Verapamil 6)  ! * Diltiazem 7)  ! Lasix 8)  ! * Advair  Past History:  Past Medical  History: Last updated: 10/05/2009  1. Severe bradycardia in the setting of permanent atrial fibrillation       with tachybrady syndrome status post single-chamber Medtronic       pacemaker placement October of 2008.   2. History of brain aneurysm.   3. Hypertension.   4. Thoracic aneurysm.   5. AAA status post resection.   6. Aortic valve replacement with bioprosthetic valve in the [facility] prior to moving here.   7. The patient also has seizures.   8. Diastolic heart failure     Past Surgical History: Last updated: 12/06/2008  1. Cerebral aneurysm repair.   2. AAA resection.   3. Aortic valve replacement with bioprosthetic valve and also       Medtronic pacemaker placed October of 2008. 1. Atrial fibrillation - Permanent.   2. Status post pacemaker for bradycardia with now well controlled       ventricular response.   3. Digoxin therapy, question level.   4. Lower extremity venous issues.  Family History: Last updated: 12/06/2008 Unremarkable  Social History: Last updated: 12/06/2008 Retired  Married  Tobacco Use - No.  Alcohol Use - no  Risk Factors: Smoking Status: never (12/06/2008)  Review of Systems  The patient denies fever, vision loss, decreased hearing, hoarseness, chest pain, syncope, dyspnea on exertion, peripheral edema, prolonged cough, abdominal pain, incontinence, muscle weakness, depression, and  enlarged lymph nodes.    Vital Signs:  Patient profile:   77 year old female Height:      64 inches Weight:      146 pounds Pulse rate:   80 / minute Pulse rhythm:   regular BP sitting:   126 / 64  (left arm) Cuff size:   regular  Vitals Entered By: Mercer Pod (January 15, 2010 11:33 AM)  Physical Exam  General:  well-appearing elderly woman in no apparent distress, HEENT exam is benign, oropharynx is clear, neck is supple with no JVP or carotid bruits, heart sounds are irregular with 2/6 systolic ejection murmur heard at the right  sternal border and left sternal border, lungs are clear to auscultation with no wheezes or rales, abdominal exam is benign with no widened aortic pulsation, no significant lower extremity edema, neurologic exam is grossly nonfocal, pulses are equal and symmetrical in her upper and lower extremities. Skin is warm and dry.   PPM Specifications Following MD:  Sherryl Manges, MD     PPM Vendor:  Medtronic     PPM Model Number:  [id number]     PPM Serial Number:  [id number] PPM DOI:  08/20/2007      Lead 1    Location: [facility]     DOI: 08/20/2007     Model #: [id number]     Serial #: [id number]     Status: active   Indications:  TACHY/BRADY SYNDROME AFIB   PPM Follow Up Pacer Dependent:  No      Episodes Coumadin:  Yes  Parameters Mode:  VVIR     Lower Rate Limit:  60     Impression & Recommendations:  Problem # 1:  ATRIAL FIBRILLATION (ICD-427.31) pacemaker placed for severe tachybradycardia syndrome and bradycardia in October 2008, followed by Dr.Klein. She is relatively asymptomatic and overall feels well with good energy. She's not currently showing signs of heart failure though she does have diastolic dysfunction by history.  Her updated medication list for this problem includes:    Digoxin 0.125 Mg Tabs (Digoxin) .[name]... Take one tablet by mouth daily    Warfarin Sodium 5 Mg Tabs (Warfarin sodium) .[name]... Take 1 tablet by mouth once a day    Atenolol 100 Mg Tabs (Atenolol) .[name]... Take one tablet by mouth a day  Problem # 2:  AORTIC VALVE DISORDERS (ICD-424.1) History of aortic valve replacement with bioprosthetic valve in [place]. It has been several years since she has had this evaluated with an echocardiogram. We have ordered this for her today.   Her updated medication list for this problem includes:    Digoxin 0.125 Mg Tabs (Digoxin) .[name]... Take one tablet by mouth daily    Atenolol 100 Mg Tabs (Atenolol) .[name]... Take one tablet by mouth a day    Triamterene-hctz 37.5-25 Mg Tabs  (Triamterene-hctz) ..... Once daily  Orders: Echocardiogram (Echo)  Problem # 3:  DIASTOLIC HEART FAILURE, CHRONIC (ICD-428.32) History of diastolic heart failure. She is on triamterene HCTZ, blood pressure well controlled and heart rate is well controlled on atenolol. Also is on digoxin. Currently no signs of heart failure and no significant shortness of breath on clinical examination.  Her updated medication list for this problem includes:    Digoxin 0.125 Mg Tabs (Digoxin) .[name]... Take one tablet by mouth daily    Warfarin Sodium 5 Mg Tabs (Warfarin sodium) .[name]... Take 1 tablet by mouth once a day    Atenolol 100 Mg Tabs (Atenolol) .[name]... Take one  tablet by mouth a day    Triamterene-hctz 37.5-25 Mg Tabs (Triamterene-hctz) ..... Once daily  Problem # 4:  PVD (ICD-443.9) she has a history of abdominal aortic aneurysm, thoracic aneurysm and brain aneurysm. We do not have a recent lipid panel for her. We'll have her complete this as soon as she is able and forward the results on to her primary care physician.  Patient Instructions: 1)  Your physician recommends that you schedule a follow-up appointment in: 6 months 2)  Your physician recommends that you continue on your current medications as directed. Please refer to the Current Medication list given to you today. 3)  Your physician has requested that you have an echocardiogram.  Echocardiography is a painless test that uses sound waves to create images of your heart. It provides your doctor with information about the size and shape of your heart and how well your heart's chambers and valves are working.  This procedure takes approximately one hour. There are no restrictions for this procedure. 4)  Your physician recommends that you return for a FASTING lipid profile: at your earliest convenience

## 2010-12-17 NOTE — Assessment & Plan Note (Signed)
Summary: pacer ck   Allergies: 1)  ! Iodine 2)  ! Pcn 3)  ! * Latex Tape 4)  ! Sulfa 5)  ! Verapamil 6)  ! * Diltiazem 7)  ! Lasix 8)  ! * Advair   PPM Specifications Following MD:  Sherryl Manges, MD     PPM Vendor:  Medtronic     PPM Model Number:  [id number]     PPM Serial Number:  [id number] PPM DOI:  08/20/2007      Lead 1    Location: [facility]     DOI: 08/20/2007     Model #: [id number]     Serial #: [id number]     Status: active   Indications:  TACHY/BRADY SYNDROME AFIB   PPM Follow Up Remote Check?  No Battery Voltage:  2.77 V     Battery Est. Longevity:  6 years     Pacer Dependent:  No     Right Ventricle  Amplitude: 15.68 mV, Impedance: 608 ohms, Threshold: 0.75 V at 0.4 msec  Episodes Coumadin:  Yes Ventricular High Rate:  1     Atrial Pacing:  56.4%      Parameters Mode:  VVIR     Lower Rate Limit:  60     Next Cardiology Appt Due:  04/18/2011 Tech Comments:  No parameter changes.  Device function normal.  ROV 6 months with Dr. Graciela Husbands in [place]. Altha Harm, LPN  October 21, 2010 1:07 PM

## 2010-12-17 NOTE — Procedures (Signed)
Summary: EGD and biopsy   EGD  Procedure date:  11/26/2007  Findings:      Findings: Gastritis  Location: [facility]   Patient Name: [name], [name] MRN:  Procedure Procedures: Panendoscopy (EGD) CPT: 43235.    with biopsy(s)/brushing(s). CPT: D1846139.  Personnel: Endoscopist: Venita Lick. Russella Dar, MD, Clementeen Graham.  Exam Location: Exam performed in Outpatient Clinic. Outpatient  Patient Consent: Procedure, Alternatives, Risks and Benefits discussed, consent obtained, from patient. Consent was obtained by the RN.  Comments: Pt. history reviewed/updated, physical exam performed prior to initiation of sedation?Yes Indications Symptoms: Weight loss. Nausea. Vomiting. Anorexia. Diarrhea. Abdominal pain, location: LUQ.  History  Current Medications: Patient is currently taking Coumadin.  Pre-Exam Physical: Performed Nov 26, 2007  Cardio-pulmonary exam abnormal. HEENT exam, Abdominal exam, Mental status exam WNL. Abnormal PE findings include: pacemaker.  Comments: Pt. history reviewed/updated, physical exam performed prior to initiation of sedation?Yes Exam Exam Info: Maximum depth of insertion Duodenum, intended Duodenum. Vocal cords not visualized. Gastric retroflexion performed. ASA Classification: III. Tolerance: excellent.  Sedation Meds: Patient assessed and found to be appropriate for moderate (conscious) sedation. Fentanyl 50 mcg. given IV. Versed 4 mg. given IV. Cetacaine Spray 2 sprays given aerosolized.  Monitoring: BP and pulse monitoring done. Oximetry used. Supplemental O2 given  Findings Normal: Proximal Esophagus to Distal Esophagus.  MUCOSAL ABNORMALITY: Cardia to Pyloric Sphincter. Thin (atrophic) folds. Erythematous mucosa. Granular mucosa. Biopsy/Mucosal Abn taken. ICD9: Gastritis, Unspecified: 535.50.  OTHER FINDING: Deformity in Body. Comments: A focal depressed area in the greater curvature-appearance is like a prior PEG site.  Normal:  Duodenal Bulb to Duodenal 2nd Portion.   Assessment  Diagnoses: 535.50: Gastritis, Unspecified.   Events  Unplanned Intervention: No unplanned interventions were required.  Unplanned Events: There were no complications. Plans Medication(s): Await pathology. PPI: QAM,   Patient Education: Patient given standard instructions for: Mucosal Abnormality.  Disposition: After procedure patient sent to recovery. After recovery patient sent home.  Scheduling: Primary Care Provider, as planned  CT Scan, abd/pelvis, next available appt.  Office Visit, to [employer]. Russella Dar, MD, [facility], around Dec 27, 2007.    This report was created from the original endoscopy report, which was reviewed and signed by the above listed endoscopist.    cc: Valera Castle, MD     Angelina Ok, MD       SP-Surgical Pathology - STATUS: Final  .                                         Perform Date: 9 Jan09 00:01  Ordered By: Rica Records Date: 12Jan09 13:07  Facility: [facility]                               Department: CPATH  Service Report Text  [facility]   [address] [address]   [place], [place] [address]   Telephone [phone] or [phone] Fax [phone]    REPORT OF SURGICAL PATHOLOGY    Case #: [id number]   Patient Name: [name], [name]   Office Chart Number: [id number]    MRN: [id number]   Pathologist: Alden Server A. Delila Spence, MD   DOB/Age 07-20-1922 (Age: 77) Gender: F   Date Taken: 11/26/2007   Date Received: 11/29/2007    FINAL DIAGNOSIS    ***  MICROSCOPIC EXAMINATION AND DIAGNOSIS***    STOMACH, BIOPSIES:   - GASTRIC BODY TYPE MUCOSA WITH CHRONIC ACTIVE GASTRITIS.   - NO INTESTINAL METAPLASIA, DYSPLASIA OR MALIGNANCY   IDENTIFIED.   - NO HELICOBACTER PYLORI ORGANISMS IDENTIFIED.    COMMENT   A Warthin-Starry stain is performed to determine the possibility   of the presence of Helicobacter pylori. The Warthin-Starry stain   is negative for  organisms of Helicobacter pylori. The control(s)   stained appropriately. (EAA:mw 11-30-07)    mw   Date Reported: 11/30/2007 Alden Server A. Delila Spence, MD   *** Electronically Signed Out By EAA ***    Clinical information   Acute gastritis (mw)    specimen(s) obtained   Stomach, biopsy    Gross Description   Received in formalin is a tan, soft tissue fragment that is   submitted in toto. Size: 0.2 cm. (GP:gt, 11/29/07)    gdt/   Additional Information  HL7 RESULT STATUS : F  External IF Update Timestamp : 2007-11-29:13:08:00.000000

## 2010-12-17 NOTE — Medication Information (Signed)
Summary: CCR/AMD  Anticoagulant Therapy  Managed by: Robyn Haber, RN, BSN Referring MD: Sherryl Manges PCP: Victorino December MD: Mariah Milling Indication 1: Aortic Valve Replacement (ICD-V43.3) Indication 2: Atrial Fibrillation (ICD-427.31) Lab Used: [facility] Site: [place] INR POC 2.3  Dietary changes: no    Health status changes: no    Bleeding/hemorrhagic complications: no    Recent/future hospitalizations: no    Any changes in medication regimen? no    Recent/future dental: no  Any missed doses?: no       Is patient compliant with meds? yes       Allergies: 1)  ! Iodine 2)  ! Pcn 3)  ! * Latex Tape 4)  ! Sulfa 5)  ! Verapamil 6)  ! * Diltiazem 7)  ! Lasix 8)  ! * Advair  Anticoagulation Management History:      The patient is taking warfarin and comes in today for a routine follow up visit.  Positive risk factors for bleeding include an age of 75 years or older.  The bleeding index is 'intermediate risk'.  Positive CHADS2 values include History of CHF and Age > 2 years old.  The start date was 08/19/2007.  Her last INR was 2.7.  Anticoagulation responsible provider: Janyiah Silveri.  INR POC: 2.3.    Anticoagulation Management Assessment/Plan:      The patient's current anticoagulation dose is Warfarin sodium 5 mg tabs: Take 1 tablet by mouth once a day.  The target INR is 2 - 3.  The next INR is due 01/30/2010.  Anticoagulation instructions were given to patient.  Results were reviewed/authorized by Robyn Haber, RN, BSN.  She was notified by Charlena Cross, RN, BSN.         Prior Anticoagulation Instructions: The patient is to continue with the same dose of coumadin.  This dosage includes: coumadin 2.5 mg daily with 5 mg on M F Sa  Current Anticoagulation Instructions: Same as Prior Instructions.

## 2010-12-17 NOTE — Medication Information (Signed)
Summary: CCR/AMD  Anticoagulant Therapy  Managed by: Cloyde Reams, RN, BSN Referring MD: Sherryl Manges PCP: Victorino December MD: Mariah Milling Indication 1: Aortic Valve Replacement (ICD-V43.3) Indication 2: Atrial Fibrillation (ICD-427.31) Lab Used: [facility] Site: [place] INR POC 2.1 INR RANGE 2.0-3.0  Dietary changes: no    Health status changes: no    Bleeding/hemorrhagic complications: no    Recent/future hospitalizations: no    Any changes in medication regimen? no    Recent/future dental: no  Any missed doses?: no       Is patient compliant with meds? yes       Allergies: 1)  ! Iodine 2)  ! Pcn 3)  ! * Latex Tape 4)  ! Sulfa 5)  ! Verapamil 6)  ! * Diltiazem 7)  ! Lasix 8)  ! * Advair  Anticoagulation Management History:      The patient is taking warfarin and comes in today for a routine follow up visit.  Positive risk factors for bleeding include an age of 75 years or older.  The bleeding index is 'intermediate risk'.  Positive CHADS2 values include History of CHF and Age > 34 years old.  The start date was 08/19/2007.  Her last INR was 2.7.  Anticoagulation responsible Faustina Gebert: Gollan.  INR POC: 2.1.  Cuvette Lot#: [id number].  Exp: 09/2011.    Anticoagulation Management Assessment/Plan:      The patient's current anticoagulation dose is Warfarin sodium 5 mg tabs: as directed.  The target INR is 2 - 3.  The next INR is due 09/18/2010.  Anticoagulation instructions were given to patient.  Results were reviewed/authorized by Cloyde Reams, RN, BSN.  She was notified by Cloyde Reams RN.         Prior Anticoagulation Instructions: INR 2.0  Continue taking 1/2 tab daily except for 1 tab on Monday and Saturday. Recheck in  Current Anticoagulation Instructions: INR 2.1  Continue on same dosage 1/2 tablet daily except 1 tablet on Mondays and Saturdays.  Recheck in 4 weeks.

## 2010-12-17 NOTE — Progress Notes (Signed)
Summary: Azithromycin and Coumadin  Phone Note Call from Patient Call back at Home Phone [phone]   Caller: SELF Call For: [facility] Summary of Call: PT IS TAKING AZITHROMYCIN FOR AN INFECTION ON HER LEG Initial call taken by: Harlon Flor,  September 20, 2010 4:27 PM  Follow-up for Phone Call        Pt has four more days of azithromycin, her next coumadin check is November 30th. Please advise if you want the pt to come in sooner for INR. Benedict Needy, RN  September 20, 2010 4:34 PM    What do you think Kennon Rounds?  Do you think pt is OK to wait to be rechecked? Follow-up by: Cloyde Reams RN,  September 23, 2010 1:26 PM  Additional Follow-up for Phone Call Additional follow up Details #1::        Azithromycin may increase INR some but last INR was 2.6.  She has consistently been lower end of range.  Since she should take her last dose of abx tomorrow, I think okay to leave her until the 30th.   Additional Follow-up by: Weston Brass PharmD,  September 23, 2010 1:58 PM    Additional Follow-up for Phone Call Additional follow up Details #2::    Called spoke with pt's niece, advised OK for pt to wait to have pt/INR rechecked on 10/16/10.  Follow-up by: Cloyde Reams RN,  September 23, 2010 2:48 PM

## 2010-12-17 NOTE — Medication Information (Signed)
Summary: CCR/AMD  Anticoagulant Therapy  Managed by: Cloyde Reams, RN, BSN Referring MD: Sherryl Manges PCP: Victorino December MD: Mariah Milling Indication 1: Aortic Valve Replacement (ICD-V43.3) Indication 2: Atrial Fibrillation (ICD-427.31) Lab Used: [facility]--[place] Site: [place] INR POC 2.5 INR RANGE 2.0-3.0    Bleeding/hemorrhagic complications: no     Any changes in medication regimen? no     Any missed doses?: no       Is patient compliant with meds? yes       Allergies: 1)  ! Iodine 2)  ! Pcn 3)  ! * Latex Tape 4)  ! Sulfa 5)  ! Verapamil 6)  ! * Diltiazem 7)  ! Lasix 8)  ! * Advair  Anticoagulation Management History:      The patient is taking warfarin and comes in today for a routine follow up visit.  Positive risk factors for bleeding include an age of 75 years or older.  The bleeding index is 'intermediate risk'.  Positive CHADS2 values include History of CHF and Age > 75 years old.  The start date was 08/19/2007.  Her last INR was 2.7.  Anticoagulation responsible provider: Gollan.  INR POC: 2.5.  Cuvette Lot#: [id number].  Exp: 06/2011.    Anticoagulation Management Assessment/Plan:      The patient's current anticoagulation dose is Warfarin sodium 5 mg tabs: Take 1 tablet by mouth once a day.  The target INR is 2 - 3.  The next INR is due 04/25/2010.  Anticoagulation instructions were given to patient.  Results were reviewed/authorized by Cloyde Reams, RN, BSN.  She was notified by Cloyde Reams RN.         Prior Anticoagulation Instructions: The patient is to continue with the same dose of coumadin.  This dosage includes: coumadin 2.5 mg daily with 5 mg on M F Sa  Current Anticoagulation Instructions: INR 2.5  Continue on same dosage 2.5mg  daily except 5mg  on Mondays, Fridays, and Saturdays.  Recheck in 4 weeks.

## 2010-12-17 NOTE — Procedures (Signed)
Summary: Incomplete Colonoscopy/[facility]  Incomplete Colonoscopy/[facility]   Imported By: Sherian Rein 01/23/2010 11:33:25  _____________________________________________________________________  External Attachment:    Type:   Image     Comment:   External Document

## 2010-12-17 NOTE — Medication Information (Signed)
Summary: CCR/GLC  Anticoagulant Therapy  Managed by: Cloyde Reams, RN, BSN Referring MD: Sherryl Manges PCP: Victorino December MD: Mariah Milling Indication 1: Aortic Valve Replacement (ICD-V43.3) Indication 2: Atrial Fibrillation (ICD-427.31) Lab Used: [facility]--[place] Site: [place] INR POC 2.5 INR RANGE 2.0-3.0  Dietary changes: no    Health status changes: no    Bleeding/hemorrhagic complications: no    Recent/future hospitalizations: no    Any changes in medication regimen? no    Recent/future dental: no  Any missed doses?: no       Is patient compliant with meds? yes       Allergies: 1)  ! Iodine 2)  ! Pcn 3)  ! * Latex Tape 4)  ! Sulfa 5)  ! Verapamil 6)  ! * Diltiazem 7)  ! Lasix 8)  ! * Advair  Anticoagulation Management History:      The patient is taking warfarin and comes in today for a routine follow up visit.  Positive risk factors for bleeding include an age of 75 years or older.  The bleeding index is 'intermediate risk'.  Positive CHADS2 values include History of CHF and Age > 49 years old.  The start date was 08/19/2007.  Her last INR was 2.7.  Anticoagulation responsible provider: Colisha Redler.  INR POC: 2.5.  Cuvette Lot#: [id number].  Exp: 07/2011.    Anticoagulation Management Assessment/Plan:      The patient's current anticoagulation dose is Warfarin sodium 5 mg tabs: as directed.  The target INR is 2 - 3.  The next INR is due 06/26/2010.  Anticoagulation instructions were given to patient.  Results were reviewed/authorized by Cloyde Reams, RN, BSN.  She was notified by Cloyde Reams RN.         Prior Anticoagulation Instructions: INR 2.3  Continue taking 0.5 tab daily except 1 tab on Monday Friday and Saturday. Recheck in 4 weeks  Current Anticoagulation Instructions: INR 2.5  Continue on same dosage 1/2 tablet daily except 1 tablet on Mondays, Friday, and Saturdays.  Recheck in 4 weeks.

## 2010-12-17 NOTE — Medication Information (Signed)
Summary: Cou[facility]in Clinic  Anticoagulant Therapy  Managed by: Cloyde Reams, RN, BSN Referring MD: Sherryl Manges PCP: Victorino December MD: Gala Romney MD, Reuel Boom Indication 1: Aortic Valve Replacement (ICD-V43.3) Indication 2: Atrial Fibrillation (ICD-427.31) Lab Used: [facility] Site: [place] INR POC 2.0 INR RANGE 2.0-3.0  Dietary changes: no    Health status changes: no    Bleeding/hemorrhagic complications: no    Recent/future hospitalizations: no    Any changes in medication regimen? no    Recent/future dental: no  Any missed doses?: no       Is patient compliant with meds? yes       Allergies: 1)  ! Iodine 2)  ! Pcn 3)  ! * Latex Tape 4)  ! Sulfa 5)  ! Verapamil 6)  ! * Diltiazem 7)  ! Lasix 8)  ! * Advair  Anticoagulation Management History:      The patient is taking warfarin and comes in today for a routine follow up visit.  Positive risk factors for bleeding include an age of 75 years or older.  The bleeding index is 'intermediate risk'.  Positive CHADS2 values include History of CHF and Age > 75 years old.  The start date was 08/19/2007.  Her last INR was 2.7.  Anticoagulation responsible provider: Bensimhon MD, Reuel Boom.  INR POC: 2.0.  Exp: 08/2011.    Anticoagulation Management Assessment/Plan:      The patient's current anticoagulation dose is Warfarin sodium 5 mg tabs: as directed.  The target INR is 2 - 3.  The next INR is due 11/13/2010.  Anticoagulation instructions were given to patient.  Results were reviewed/authorized by Cloyde Reams, RN, BSN.  She was notified by Cloyde Reams RN.         Prior Anticoagulation Instructions: INR 2.6  Continue on same dosage 1/2 tablet daily except 1 tablet on Mondays and Saturdays.  Recheck in 4 weeks.    Current Anticoagulation Instructions: INR 2.0  Continue on same dosage 1/2 tablet daily except 1 tablet on Mondays and Saturdays.  Recheck in 4 weeks.

## 2010-12-17 NOTE — Letter (Signed)
Summary: [employer] [facility] at [address]   [place], [place] [address]   Phone: [phone]  Fax: [phone]        May 28, 2010 MRN: [id number]    [name] [address] [place], [place]  [address]    Dear [name],  You are scheduled to have CT scan of your chest and abdomen on July 19,2011 at [facility]  Please arrive at the RADIOLOGY department at 9:00am.   Please pick up your liquid contrast June 03, 2010 at your convience in the Radiology department.   Pre-medications *July 18 (day before your test) prednisone 60mg          *July 19 (day of your test) benadryl 25mg  and prednisone           60mg          *July 20 (day after your test) predisone 60mg    DO NOT eat or drink anything after midnight July 18th   If you have any questions please call our office at [phone]         Sincerely,  Benedict Needy, RN  This letter has been electronically signed by your physician.

## 2010-12-17 NOTE — Progress Notes (Signed)
Summary: UPPER RESPIRATORY INFECTION  Phone Note Call from Patient Call back at Home Phone [phone]   Caller: SELF Call For: [facility] Summary of Call: PT HAS AN UPPER RESPIRATORY INFECTION AND IS TAKING AZITHROMYCIN AND CHERATUSSIN AC 10-100M-PT IS TO HAVE COUMADIN CHECKED TOMORROW, WILL THIS EFFECT IT? Initial call taken by: Harlon Flor,  April 24, 2010 3:09 PM  Follow-up for Phone Call        Spoke to pt's niece informed her that azithromycin can some times affect coumadin.  She informed me that she started the abx on monday and will finish on friday. appointment made for moday 6/13 to have inr checked.  Follow-up by: Benedict Needy, RN,  April 24, 2010 4:05 PM

## 2010-12-17 NOTE — Progress Notes (Signed)
----   Converted from flag ---- ---- 05/27/2010 10:13 AM, Gypsy Balsam RN BSN wrote: Dr Mariah Milling, please review CT angio of Chest from 6-10 (in E-chart), pt questioning when this should be re-evaluated.  I told her that I would ask you.  Thanks. 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 If nobody else if following her aneurysm, we should have her repeat a chest and ABD CT with contrast at [facility] or [place]. My preference would be [place] in case we need surgical consult   Appended Document:  No answer after 10+ rings  Appended Document:  Pt aware that we will be scheduling CT of chest and abdomen. Pt requests that we call her before scheduling.  Cannot have test scheduled this week.   Appended Document:  CT scheduled for july 19 at 10am must arrive at 9 am for labs

## 2010-12-17 NOTE — Procedures (Signed)
Summary: Colonoscopy/[facility]  Colonoscopy/[facility]   Imported By: Sherian Rein 01/23/2010 11:26:02  _____________________________________________________________________  External Attachment:    Type:   Image     Comment:   External Document

## 2010-12-17 NOTE — Miscellaneous (Signed)
Summary: Orders Update  Clinical Lists Changes  Orders: Added new Test order of T-Basic Metabolic Panel [phone]) - Signed  Appended Document: Orders Update Order faxed to [facility] for [facility]- [phone].

## 2010-12-17 NOTE — Procedures (Signed)
Summary: Upper GI Panendoscopy/[facility]  Upper GI Panendoscopy/[facility]   Imported By: Sherian Rein 01/23/2010 11:29:50  _____________________________________________________________________  External Attachment:    Type:   Image     Comment:   External Document

## 2010-12-17 NOTE — Procedures (Signed)
Summary: Upper GI Panendoscopy/[facility]  Upper GI Panendoscopy/[facility]   Imported By: Sherian Rein 01/23/2010 11:24:40  _____________________________________________________________________  External Attachment:    Type:   Image     Comment:   External Document

## 2010-12-17 NOTE — Procedures (Signed)
Summary: Cardiology Device Clinic   Current Medications (verified): 1)  Digoxin 0.125 Mg Tabs (Digoxin) .... Take One Tablet By Mouth Daily 2)  Warfarin Sodium 5 Mg Tabs (Warfarin Sodium) .... As Directed 3)  Atenolol 100 Mg Tabs (Atenolol) .... Take One Tablet By Mouth A Day 4)  Omeprazole 40 Mg Cpdr (Omeprazole) .[name].. 1 By Mouth Two Times A Day 5)  Triamterene-Hctz 37.5-25 Mg Tabs (Triamterene-Hctz) .... Once Daily 6)  Lorazepam 0.5 Mg Tabs (Lorazepam) .... Three Times A Day As Needed 7)  Eye Vitamins  Caps (Multiple Vitamins-Minerals) .... Once Daily  Allergies (verified): 1)  ! Iodine 2)  ! Pcn 3)  ! * Latex Tape 4)  ! Sulfa 5)  ! Verapamil 6)  ! * Diltiazem 7)  ! Lasix 8)  ! * Advair  PPM Specifications Following MD:  Sherryl Manges, MD     PPM Vendor:  [employer]     PPM Model Number:  [id number]     PPM Serial Number:  [id number] PPM DOI:  08/20/2007      Lead 1    Location: RV     DOI: 08/20/2007     Model #: [id number]     Serial #: [id number]     Status: active   Indications:  TACHY/BRADY SYNDROME AFIB   PPM Follow Up Remote Check?  No Battery Voltage:  2.77 V     Battery Est. Longevity:  6 years     Pacer Dependent:  No     Right Ventricle  Amplitude: 15.68 mV, Impedance: 608 ohms, Threshold: 0.75 V at 0.4 msec  Episodes Coumadin:  Yes Ventricular Pacing:  56.4%  Parameters Mode:  VVIR     Lower Rate Limit:  60     Next Cardiology Appt Due:  04/18/2011 Tech Comments:  No parameter changes.  Device function normal.  A-fib, + coumadin.  ROV 6 months with Dr. Graciela Husbands in [place]. Altha Harm, LPN  October 18, 2010 4:52 PM

## 2010-12-17 NOTE — Medication Information (Signed)
Summary: CCR/AMD  Anticoagulant Therapy  Managed by: Cloyde Reams, RN, BSN Referring MD: Sherryl Manges PCP: Victorino December MD: Mariah Milling Indication 1: Aortic Valve Replacement (ICD-V43.3) Indication 2: Atrial Fibrillation (ICD-427.31) Lab Used: [facility]--[place] Site: [place] INR POC 2.0 INR RANGE 2.0-3.0  Dietary changes: no     Bleeding/hemorrhagic complications: no     Any changes in medication regimen? yes       Details: clindamycin which there is no interaction with coumadin.  Recent/future dental: no  Any missed doses?: no       Is patient compliant with meds? yes       Allergies: 1)  ! Iodine 2)  ! Pcn 3)  ! * Latex Tape 4)  ! Sulfa 5)  ! Verapamil 6)  ! * Diltiazem 7)  ! Lasix 8)  ! * Advair  Anticoagulation Management History:      The patient is taking warfarin and comes in today for a routine follow up visit.  Positive risk factors for bleeding include an age of 75 years or older.  The bleeding index is 'intermediate risk'.  Positive CHADS2 values include History of CHF and Age > 69 years old.  The start date was 08/19/2007.  Her last INR was 2.7.  Anticoagulation responsible provider: Gollan.  INR POC: 2.0.  Cuvette Lot#: [id number].  Exp: 08/2011.    Anticoagulation Management Assessment/Plan:      The patient's current anticoagulation dose is Warfarin sodium 5 mg tabs: as directed.  The target INR is 2 - 3.  The next INR is due 08/21/2010.  Anticoagulation instructions were given to patient.  Results were reviewed/authorized by Cloyde Reams, RN, BSN.  She was notified by Benedict Needy, RN.         Prior Anticoagulation Instructions: INR 4.2  DO NOT TAKE COUMADIN TODAY. Start takin 1/2 tab daily except for 1 tab on Monday and Saturday. Recheck in 1 week.   Current Anticoagulation Instructions: INR 2.0  Continue taking 1/2 tab daily except for 1 tab on Monday and Saturday. Recheck in

## 2010-12-18 ENCOUNTER — Encounter: Payer: Self-pay | Admitting: Cardiovascular Disease

## 2010-12-18 DIAGNOSIS — I059 Rheumatic mitral valve disease, unspecified: Secondary | ICD-10-CM

## 2010-12-18 DIAGNOSIS — G459 Transient cerebral ischemic attack, unspecified: Secondary | ICD-10-CM

## 2010-12-18 LAB — COMPREHENSIVE METABOLIC PANEL
Albumin: 3.2 g/dL — ABNORMAL LOW (ref 3.5–5.2)
Alkaline Phosphatase: 80 U/L (ref 39–117)
BUN: 18 mg/dL (ref 6–23)
GFR calc Af Amer: >60 mL/min (ref >60)
Potassium: 4.5 mEq/L (ref 3.5–5.1)
Sodium: 138 mEq/L (ref 135–145)
Total Protein: 6.1 g/dL (ref 6.0–8.3)

## 2010-12-18 LAB — CBC
HCT: 37.8 % (ref 36.0–46.0)
Hemoglobin: 12.4 g/dL (ref 12.0–15.0)
MCH: 29 pg (ref 26.0–34.0)
MCHC: 32.8 g/dL (ref 30.0–36.0)
MCV: 88.3 fL (ref 78.0–100.0)

## 2010-12-18 LAB — PROTIME-INR: INR: 2.66 — ABNORMAL HIGH (ref 0.00–1.49)

## 2010-12-18 LAB — CARDIAC PANEL(CRET KIN+CKTOT+MB+TROPI)
CK, MB: 2.6 ng/mL (ref 0.3–4.0)
Total CK: 63 U/L (ref 7–177)

## 2010-12-19 LAB — PROTIME-INR
INR: 2.37 — ABNORMAL HIGH (ref 0.00–1.49)
Prothrombin Time: 26 seconds — ABNORMAL HIGH (ref 11.6–15.2)

## 2010-12-19 LAB — LIPID PANEL
Cholesterol: 181 mg/dL (ref 0–200)
LDL Cholesterol: 120 mg/dL — ABNORMAL HIGH (ref 0–99)

## 2010-12-19 NOTE — Medication Information (Signed)
Summary: CCR/AMD  Anticoagulant Therapy  Managed by: Cloyde Reams, RN, BSN Referring MD: Sherryl Manges PCP: Victorino December MD: Gala Romney MD, Reuel Boom Indication 1: Aortic Valve Replacement (ICD-V43.3) Indication 2: Atrial Fibrillation (ICD-427.31) Lab Used: [facility]--[place] Site: [place] INR POC 1.9 INR RANGE 2.0-3.0  Dietary changes: no    Health status changes: no    Bleeding/hemorrhagic complications: no    Recent/future hospitalizations: no    Any changes in medication regimen? yes       Details: Just completed abx and is getting ready to start Doxycycline 100mg  bid x 10 days.   Recent/future dental: no  Any missed doses?: no       Is patient compliant with meds? yes       Allergies: 1)  ! Iodine 2)  ! Pcn 3)  ! * Latex Tape 4)  ! Sulfa 5)  ! Verapamil 6)  ! * Diltiazem 7)  ! Lasix 8)  ! * Advair  Anticoagulation Management History:      The patient is taking warfarin and comes in today for a routine follow up visit.  Positive risk factors for bleeding include an age of 75 years or older.  The bleeding index is 'intermediate risk'.  Positive CHADS2 values include History of CHF and Age > 28 years old.  The start date was 08/19/2007.  Her last INR was 2.7.  Anticoagulation responsible provider: Bensimhon MD, Reuel Boom.  INR POC: 1.9.  Cuvette Lot#: [id number].  Exp: 12/2011.    Anticoagulation Management Assessment/Plan:      The patient's current anticoagulation dose is Warfarin sodium 5 mg tabs: as directed.  The target INR is 2 - 3.  The next INR is due 12/18/2010.  Anticoagulation instructions were given to patient.  Results were reviewed/authorized by Cloyde Reams, RN, BSN.  She was notified by Cloyde Reams RN.         Prior Anticoagulation Instructions: INR 2.1  Pt will continue current dosage of Coumadin 5mg  1/2 tablet once daily except 1 tablet Mondays and Saturdays. Recheck in 4 weeks.   Current Anticoagulation  Instructions: INR 1.9  Take 1 tablet today, then resume same dosage 1/2 tablet daily except 1 tablet on Mondays and Saturdays.  Recheck in 1 weeks.

## 2010-12-19 NOTE — Medication Information (Signed)
Summary: ROV  Anticoagulant Therapy  Managed by: Lanny Hurst, RN Referring MD: Sherryl Manges PCP: Victorino December MD: Julien Nordmann Indication 1: Aortic Valve Replacement (ICD-V43.3) Indication 2: Atrial Fibrillation (ICD-427.31) Lab Used: [facility]  Site: [place] INR POC 2.1 INR RANGE 2.0-3.0  Dietary changes: no    Health status changes: no    Bleeding/hemorrhagic complications: no    Recent/future hospitalizations: no    Any changes in medication regimen? no    Recent/future dental: no  Any missed doses?: no       Is patient compliant with meds? yes       Allergies: 1)  ! Iodine 2)  ! Pcn 3)  ! * Latex Tape 4)  ! Sulfa 5)  ! Verapamil 6)  ! * Diltiazem 7)  ! Lasix 8)  ! * Advair  Anticoagulation Management History:      The patient is taking warfarin and comes in today for a routine follow up visit.  Positive risk factors for bleeding include an age of 75 years or older.  The bleeding index is 'intermediate risk'.  Positive CHADS2 values include History of CHF and Age > 75 years old.  The start date was 08/19/2007.  Her last INR was 2.7.  Anticoagulation responsible Ravi Tuccillo: Julien Nordmann.  INR POC: 2.1.  Cuvette Lot#: [id number].  Exp: 08/2011.    Anticoagulation Management Assessment/Plan:      The patient's current anticoagulation dose is Warfarin sodium 5 mg tabs: as directed.  The target INR is 2 - 3.  The next INR is due 12/10/2010.  Anticoagulation instructions were given to patient.  Results were reviewed/authorized by Lanny Hurst, RN.  She was notified by Lanny Hurst RN.         Prior Anticoagulation Instructions: INR 2.0  Continue on same dosage 1/2 tablet daily except 1 tablet on Mondays and Saturdays.  Recheck in 4 weeks.    Current Anticoagulation Instructions: INR 2.1  Pt will continue current dosage of Coumadin 5mg  1/2 tablet once daily except 1 tablet Mondays and Saturdays. Recheck in 4  weeks.

## 2010-12-19 NOTE — Cardiovascular Report (Signed)
Summary: Office Visit   Office Visit   Imported By: Roderic Ovens 11/05/2010 08:51:32  _____________________________________________________________________  External Attachment:    Type:   Image     Comment:   External Document

## 2010-12-26 ENCOUNTER — Ambulatory Visit (INDEPENDENT_AMBULATORY_CARE_PROVIDER_SITE_OTHER): Payer: Medicare Other | Admitting: Cardiovascular Disease

## 2010-12-26 ENCOUNTER — Encounter: Payer: Self-pay | Admitting: Cardiovascular Disease

## 2010-12-26 DIAGNOSIS — G459 Transient cerebral ischemic attack, unspecified: Secondary | ICD-10-CM | POA: Insufficient documentation

## 2010-12-26 DIAGNOSIS — E785 Hyperlipidemia, unspecified: Secondary | ICD-10-CM | POA: Insufficient documentation

## 2010-12-26 DIAGNOSIS — I4891 Unspecified atrial fibrillation: Secondary | ICD-10-CM

## 2010-12-26 DIAGNOSIS — I5032 Chronic diastolic (congestive) heart failure: Secondary | ICD-10-CM

## 2010-12-26 DIAGNOSIS — I359 Nonrheumatic aortic valve disorder, unspecified: Secondary | ICD-10-CM

## 2010-12-26 LAB — CONVERTED CEMR LAB: POC INR: 2.6

## 2010-12-27 ENCOUNTER — Ambulatory Visit: Payer: Medicare Other | Attending: Family Medicine | Admitting: Physical Therapy

## 2010-12-27 DIAGNOSIS — R269 Unspecified abnormalities of gait and mobility: Secondary | ICD-10-CM | POA: Insufficient documentation

## 2010-12-27 DIAGNOSIS — R42 Dizziness and giddiness: Secondary | ICD-10-CM | POA: Insufficient documentation

## 2010-12-27 DIAGNOSIS — IMO0001 Reserved for inherently not codable concepts without codable children: Secondary | ICD-10-CM | POA: Insufficient documentation

## 2011-01-01 NOTE — H&P (Signed)
[name], RION NO.:  [id number]  MEDICAL RECORD NO.:  [id number]           PATIENT TYPE:  I  LOCATION:  3730                         FACILITY:  [facility]  PHYSICIAN:  Pearlean Brownie, M.D.DATE OF BIRTH:  03-02-1922  DATE OF ADMISSION:  12/17/2010 DATE OF DISCHARGE:                             HISTORY & PHYSICAL   PRIMARY CARE PHYSICIAN:  Sheikh A. Ellsworth Lennox, MD in [place].  PRIMARY CARDIOLOGIST:  Antonieta Iba, MD with [facility].  CHIEF COMPLAINT:  Resolving TIA.  HISTORY OF PRESENT ILLNESS:  This is an 77 year old female with past medical history of AFib, hypertension and GERD, presenting with now resolving left-sided facial droop and slurred speech.  Family first noticed symptoms at approximately 3:20 p.m. and had lasted for 5-7 minutes with complete resolution within 30 minutes to 1 hour. According to the patient, she has been feeling tired for the past few weeks, but felt significantly more fatigued this morning as well as the time surrounding the event.  The patient herself did not notice any facial droop or slurred speech, but her niece did notice that the patient does not report any numbness, tingling or dizziness at the time of this episodes, does report a mild headache throughout the day today, which has resolved by the time of our exam.  The patient also experienced palpitations immediately following the event.  The patient does have known AFib, but does not have palpitations on a regular basis. Of note, the patient did have one episode of blindness on the temporal half of her left eye a few weeks ago, that per the patient did not last very long and she did not pay much attention to how long it lasted or when in resolved.  She also had an episode of dizziness this past Saturday getting that her ear puffs.  She felt like the room was spinning, sleeping against the wall and the episode of dizziness resolves spontaneously.  She  has not had any dizziness since that times and also does not recall how long she was that before.  ALLERGIES: 1. CARDIZEM causes nausea and vomiting. 2. LASIX caused liver damage in the past. 3. PENICILLIN causes rash and throat swelling. 4. SULFA causes rash. 5. VERAPAMIL causes nausea and vomiting. 6. IODINE causes fever, chills question of anaphylaxis. 7. LATEX causes rash and skin to peel.  HOME MEDICATIONS: 1. Doxycycline 100 mg p.o. b.i.d. until Friday, December 20, 2010. 2. Digoxin 0.25 p.o. nightly. 3. Lorazepam 0.5 b.i.d. 4. Atenolol 100 mg p.o. daily. 5. Maxzide 37.5//2.5 mg p.o. daily. 6. Omeprazole 40 mg p.o. b.i.d. 7. Coumadin 5 mg p.o. on Saturday and Monday, 2.5 mg p.o. Sunday,     Tuesday, Wednesday, Thursday, Friday.  PAST MEDICAL HISTORY: 1. AFib/pacemaker implantation. 2. Hypertension. 3. Diastolic CHF. 4. GERD. 5. Diverticulitis. 6. Abdominal aortic aneurysm. 7. Abdominal hernia. 8. Cyst on kidney 9. Varicose veins. 10.Questionable history of hepatitis B.  PAST SURGICAL HISTORY: 1. Aortic valve replacement in 2007.  This was complicated with     seizures as the patient was coming out of anesthesia. 2. Benign area of lung was removed. 3. Pacemaker  implantation for atrial fibrillation in October 2008. 4. Laser surgery in December 2011 for varicose veins leading to lower     extremity ulcer.  SOCIAL HISTORY:  The patient lives with her niece in [place]  She used to be a Interior and spatial designer, at this time, retired; however, she gets active around the house and going to church.  She denies tobacco, alcohol or other drug use.  FAMILY HISTORY:  Significant for TIAs in her one sister as well as stroke and another sister is also positive for CAD and aneurysm.  REVIEW OF SYSTEMS:  Positive for fatigue, headache, rhinorrhea, palpitations, dyspnea on exertion, arthralgias, visual changes, slurred speech and dizziness.  Negative for fevers, chills,  sweats, chest pain, edema, orthopnea, cough, wheezing, nausea, vomiting, diarrhea, abdominal pain, dysuria, myalgias, weakness.  PHYSICAL EXAMINATION:  VITAL SIGNS:  Temperature 97.4, blood pressure 103/65, pulse 92, respirations 22, PO2 99% on 2 liters nasal cannula. GENERAL:  No acute distress, sitting in bed, appears comfortable. HEENT:  Atraumatic.  PERRLA.  Extraocular movements intact.  Moist mucous membranes.  No nasal discharge.  Fair dentition.  Pharynx pink without exudate. NECK:  No lymphadenopathy and no carotid bruits appreciated. Cardiovascular:  Irregularly irregular, rate controlled 60s-90s, 3-4/6 crescendo-decrescendo systolic murmur. LUNGS:  Clear to auscultation bilaterally.  No crackles or wheezes. ABDOMEN:  Soft, nontender, nondistended.  Positive bowel sounds. EXTREMITIES:  Warm and well perfused, 2+ pedal pulses.  No pitting edema, chronic venous insufficiency changes in bilateral lower extremities with two small 3 x 3 cm well circumscribed shallow lesions on left calf.  No erythema or discharge from these ulcers. NEUROLOGIC:  Alert and oriented x3, some difficulty with specific day, but new day of the week, months and years.  Cranial nerves II through XII intact.  No ptosis, slurred speech or facial droop.  5/5 strength throughout.  LABS AND STUDIES:  CBC; 5.9/13.6/41.8/168.  CMET 139/5.0/100/30/22/0.7/119.  T-bili 0.6, alk phos 101, AST/ALT 29/22, told protein 6.8, PT/INR 26.8/2.46, digoxin 0.5.  Urinalysis negative with the exception of trace leukocytes, few epithelial cells and rare bacteria on microscopic examination and hyaline casts.  CT of the head with no acute findings.  No intracranial hemorrhage, mass lesion, or acute infarct, questionable Dandy-Walker variant, no prior infarct, not acute.  ASSESSMENT AND PLAN:  This is an 77 year old female with extensive cardiac history including atrial fibrillation, abdominal aortic aneurysm, hypertension,  congestive heart failure and venous stasis, status post aortic valve replacement in 2007 and pacemaker in 2008, presenting with resolving transient ischemic attack. 1. Neuro/transient ischemic attack.  We will admit to tele bed for     stroke/transient ischemic attack workup.  Concern for prior     transient ischemic attack with history of left-sided temporal     transient blindness, transient vertigo and now left-sided facial     droop with slurred speech.  The patient with history of     hypertension and atrial fibrillation that is rate controlled,     currently therapeutic on Coumadin.  We will have a bedside swallow     evaluation prior to anything p.o.  We will start aspirin 325 mg     p.o. daily, cycle cardiac enzymes x3 q.6 h.  We will get an a.m.     CBC, CMET, INR, hemoglobin A1c and fasting lipid panel, also get an     a.m. EKG, obtain carotid Dopplers and 2-D echo, also have a PT/OT     consults. 2. Cardiac.  The patient is  followed by [facility] for atrial     fibrillation, status post pacemaker, congestive heart failure,     abdominal aortic aneurysm and aortic valve replacement.  We will     continue home meds of digoxin, atenolol, Maxzide, and Coumadin.     The patient will be monitored on telemetry. 3. Gastroesophageal reflux disease.  The patient on home omeprazole 40     mg p.o. b.i.d.  We will give Protonix 40 mg b.i.d. 4. Infectious Disease.  Venous stasis ulcer.  We will obtain a wound     consult in the a.m. and continue the patient doxycycline 100 mg     p.o. b.i.d.  Per the patient, this course to be finished on Friday,     December 20, 2010.  This is her second course of treatment with the     doxycycline for these lower extremities ulcers after having     varicose veins surgery. 5. Fluids, electrolytes, nutrition/gastroenterology.  We will start a     heart healthy diet once the patient has a bedside swallow     evaluation, saline lock IV. 6.  Prophylaxis.  The patient is therapeutic on Coumadin.  We will give     Protonix for gastrointestinal prophylaxis. 7. Disposition.  Pending stroke, transient ischemic attack workup     completion.  The patient will likely be discharged home tomorrow if     there are no other acute changes overnight.    ______________________________ Demetria Pore, MD   ______________________________ Pearlean Brownie, M.D.    JM/MEDQ  D:  12/17/2010  T:  12/18/2010  Job:  [id number]  Electronically Signed by Demetria Pore MD on 12/20/2010 08:45:33 AM Electronically Signed by Pearlean Brownie M.D. on 01/01/2011 04:57:25 PM

## 2011-01-02 ENCOUNTER — Ambulatory Visit: Payer: Medicare Other | Admitting: Physical Therapy

## 2011-01-02 NOTE — Medication Information (Signed)
Summary: Coumadin Clinic  Anticoagulant Therapy  Managed by: Lanny Hurst Referring MD: Sherryl Manges PCP: Victorino December MD: Gala Romney MD, Reuel Boom Indication 1: Aortic Valve Replacement (ICD-V43.3) Indication 2: Atrial Fibrillation (ICD-427.31) Lab Used: [facility] Site: [place] INR POC 2.6 INR RANGE 2.5-3.0  Dietary changes: no    Health status changes: no    Bleeding/hemorrhagic complications: no    Recent/future hospitalizations: no    Any changes in medication regimen? no    Recent/future dental: no  Any missed doses?: no       Is patient compliant with meds? yes       Allergies: 1)  ! Iodine 2)  ! Pcn 3)  ! * Latex Tape 4)  ! Sulfa 5)  ! Verapamil 6)  ! * Diltiazem 7)  ! Lasix 8)  ! * Advair  Anticoagulation Management History:      The patient is taking warfarin and comes in today for a routine follow up visit.  Positive risk factors for bleeding include an age of 35 years or older.  The bleeding index is 'intermediate risk'.  Positive CHADS2 values include History of CHF and Age > 15 years old.  The start date was 08/19/2007.  Her last INR was 2.7.  Anticoagulation responsible provider: Bensimhon MD, Reuel Boom.  INR POC: 2.6.  Exp: 12/2011.    Anticoagulation Management Assessment/Plan:      The patient's current anticoagulation dose is Warfarin sodium 5 mg tabs: as directed Sat. & Mon., Warfarin sodium 2.5 mg tabs: Sun, Tues, Wed. Thurs. Friday.  The target INR is 2.5-3.5.  The next INR is due 01/08/2011.  Anticoagulation instructions were given to patient.  Results were reviewed/authorized by Lanny Hurst.  She was notified by Lanny Hurst RN.         Prior Anticoagulation Instructions: INR 1.9  Take 1 tablet today, then resume same dosage 1/2 tablet daily except 1 tablet on Mondays and Saturdays.  Recheck in 1 weeks.    Current Anticoagulation Instructions: INR 2.6  Continue same dosage 1/2 tablet every day  except 1 tablet on Mondays and Saturdays. Recheck in 2 weeks.

## 2011-01-02 NOTE — Assessment & Plan Note (Signed)
Summary: f/u [facility], check INR/sab   Visit Type:  Follow-up Referring Provider:  wall Primary Provider:  Ellsworth Lennox  CC:  F/U [facility]. "Doing well"..  History of Present Illness: 77 yo WF with history of tachy-brady syndrome s/p pacemaker, Permanent atrial fibrillation, HTN, AVR, AAA with repair 4-5 years ago, and chronic diastolic heart failure,  right brachial arterial stenosis, With recent TIA.  Her INR prior to her TIA was 1.9-2.0 range. Discharge paperwork suggests running her INR 2.5 to greater than 3. Overall she feels well though does have some weakness following her discharge from the hospital. She is using a walker. She has had laser treatment on her legs for ulcers and believes it is helping. she is scheduled to participate in the vestibular physical therapy next week and she does have episodes of dizziness.  cardiac catheterization over 5 years ago showing no significant coronary artery disease,  recent echo showing mild aortic stenosis with intact aortic valve, bioprosthetic valve, mild pulmonary hypertension  recent CT scan showing stable descending aorta and after repair, minimal dilatation of the descending aorta, stable-appearing aortic valve.  EKG shows atrial fibrillation with intermittent paced rhythm, right bundle branch block, rate of 74 beats per minute  Current Medications (verified): 1)  Digoxin 0.125 Mg Tabs (Digoxin) .... Take One Tablet By Mouth Daily 2)  Warfarin Sodium 5 Mg Tabs (Warfarin Sodium) .... As Directed Sat. & Mon. 3)  Atenolol 100 Mg Tabs (Atenolol) .... Take One Tablet By Mouth A Day 4)  Omeprazole 40 Mg Cpdr (Omeprazole) .[name].. 1 By Mouth Two Times A Day 5)  Triamterene-Hctz 37.5-25 Mg Tabs (Triamterene-Hctz) .... Once Daily 6)  Lorazepam 0.5 Mg Tabs (Lorazepam) .... Three Times A Day As Needed 7)  Eye Vitamins  Caps (Multiple Vitamins-Minerals) .... Once Daily 8)  Warfarin Sodium 2.5 Mg Tabs (Warfarin Sodium) .... Evern Bio, Wed. Thurs. Friday 9)   Bacitra-Neomycin-Polymyxin-Hc 1 % Oint (Bacitracin-Polymyx-Neo-Hc) .... As Needed  Allergies (verified): 1)  ! Iodine 2)  ! Pcn 3)  ! * Latex Tape 4)  ! Sulfa 5)  ! Verapamil 6)  ! * Diltiazem 7)  ! Lasix 8)  ! * Advair  Past History:  Past Medical History: Last updated: 01/22/2010  1. Severe bradycardia in the setting of permanent atrial fibrillation       with tachybrady syndrome status post single-chamber Medtronic       pacemaker placement October of 2008.   2. History of brain aneurysm.   3. Hypertension.   4. Thoracic aneurysm.   5. AAA status post resection.   6. Aortic valve replacement with bioprosthetic valve in the [facility] prior to moving here.   7. The patient also has seizures.   8. Diastolic heart failure   Diverticulosis Hemorrhoids Gastritis  Past Surgical History: Last updated: 01/22/2010  1. Cerebral aneurysm repair.   2. AAA resection.   3. Aortic valve replacement with bioprosthetic valve and also       Medtronic pacemaker placed October of 2008. 1. Atrial fibrillation - Permanent.   2. Status post pacemaker for bradycardia with now well controlled       ventricular response.   3. Digoxin therapy, question level.   4. Lower extremity venous issues. Breast Biopsy Cholecystectomy Hysterectomy  Family History: Last updated: 12/06/2008 Unremarkable  Social History: Last updated: 12/06/2008 Retired  Married  Tobacco Use - No.  Alcohol Use - no  Risk Factors: Smoking Status: never (12/06/2008)  Review of  Systems       The patient complains of difficulty walking.  The patient denies fever, weight loss, weight gain, vision loss, decreased hearing, hoarseness, chest pain, syncope, dyspnea on exertion, peripheral edema, prolonged cough, abdominal pain, incontinence, muscle weakness, depression, and enlarged lymph nodes.         week, dizziness  Vital Signs:  Patient profile:   77 year old female Height:      64  inches Weight:      144 pounds BMI:     24.81 Pulse rate:   76 / minute BP sitting:   115 / 71  (left arm) Cuff size:   regular  Vitals Entered By: Bishop Dublin, CMA (December 26, 2010 11:05 AM)  Physical Exam  General:  The patient was alert and oriented in no acute distress. HEENT Normal.  Neck veins were flat, carotids were brisk.  Lungs were clear.  Heart sounds were regular with2/6 murmur Abdomen was soft with active bowel sounds  There is no clubbing cyanosis or edema. Skin Warm and dry  Msk:  Back normal, normal gait. Muscle strength and tone normal. Pulses:  minimal right radial pulse of the upper extremity appreciated otherwise normal pulses Neurologic:  Alert and oriented x 3. Skin:  Intact without lesions or rashes. Psych:  Normal affect.   PPM Specifications Following MD:  Sherryl Manges, MD     PPM Vendor:  Medtronic     PPM Model Number:  [id number]     PPM Serial Number:  [id number] PPM DOI:  08/20/2007      Lead 1    Location: RV     DOI: 08/20/2007     Model #: [id number]     Serial #: [id number]     Status: active   Indications:  TACHY/BRADY SYNDROME AFIB   PPM Follow Up Pacer Dependent:  No      Episodes Coumadin:  Yes  Parameters Mode:  VVIR     Lower Rate Limit:  60     Impression & Recommendations:  Problem # 1:  DIASTOLIC HEART FAILURE, CHRONIC (ICD-428.32) no evidence of diastolic heart failure on today's visit. She does continue to have episodes of dizziness. We have suggested that she cut her triamterene HCTZ in half and monitor her blood pressure.  Her updated medication list for this problem includes:    Digoxin 0.125 Mg Tabs (Digoxin) .[name]... Take one tablet by mouth daily    Warfarin Sodium 5 Mg Tabs (Warfarin sodium) .[name]... As directed sat. & mon.    Atenolol 100 Mg Tabs (Atenolol) .[name]... Take 1/2  tablet by mouth a day    Triamterene-hctz 37.5-25 Mg Tabs (Triamterene-hctz) ..... Once daily    Warfarin Sodium 2.5 Mg Tabs (Warfarin sodium) .....  Wynelle Link, tues, wed. thurs. friday  Problem # 2:  ATRIAL FIBRILLATION (ICD-427.31) We will continue her atenolol 100 mg daily. She is paced 50% of the time. She is to followup with EP for pacer interrogation every 6 months.  Her updated medication list for this problem includes:    Digoxin 0.125 Mg Tabs (Digoxin) .[name]... Take one tablet by mouth daily    Warfarin Sodium 5 Mg Tabs (Warfarin sodium) .[name]... As directed sat. & mon.    Atenolol 100 Mg Tabs (Atenolol) .[name]... Take one tablet by mouth a day    Warfarin Sodium 2.5 Mg Tabs (Warfarin sodium) ..... Wynelle Link, tues, wed. thurs. friday  Problem # 3:  AORTIC VALVE DISORDERS (ICD-424.1) AVR with echocardiogram showing well  seeded well-functioning valve.  Her updated medication list for this problem includes:    Digoxin 0.125 Mg Tabs (Digoxin) .[name]... Take one tablet by mouth daily    Atenolol 100 Mg Tabs (Atenolol) .[name]... Take one tablet by mouth a day    Triamterene-hctz 37.5-25 Mg Tabs (Triamterene-hctz) ..... Once daily  Problem # 4:  DIZZINESS AND GIDDINESS (ICD-780.4)  Etiology of her continued episodes of dizziness is uncertain. We will cut back on her blood pressure pill as detailed above. She is scheduled for followup with vestibular physical therapy.  Problem # 5:  TIA (ICD-435.9) recent TIA. We will move her INR goal to >2.5. I will also try to obtain her recent carotid ultrasound from her hospital course.    Problem # 6:  HYPERLIPIDEMIA-MIXED (ICD-272.4) Total cholesterol is 181, LDL 120, HDL 41, triglycerides 88. If she does have underlying carotid disease, we will start a statin.  Patient Instructions: 1)  Your physician recommends that you schedule a follow-up appointment in: 6 months 2)  Your physician recommends that you continue on your current medications as directed. Please refer to the Current Medication list given to you today.

## 2011-01-03 ENCOUNTER — Encounter: Payer: PRIVATE HEALTH INSURANCE | Admitting: Physical Therapy

## 2011-01-07 ENCOUNTER — Encounter: Payer: Self-pay | Admitting: Cardiovascular Disease

## 2011-01-07 ENCOUNTER — Other Ambulatory Visit (INDEPENDENT_AMBULATORY_CARE_PROVIDER_SITE_OTHER): Payer: Medicare Other

## 2011-01-07 DIAGNOSIS — Z7901 Long term (current) use of anticoagulants: Secondary | ICD-10-CM

## 2011-01-07 DIAGNOSIS — I4891 Unspecified atrial fibrillation: Secondary | ICD-10-CM

## 2011-01-07 LAB — CONVERTED CEMR LAB: POC INR: 2.8

## 2011-01-09 ENCOUNTER — Ambulatory Visit: Payer: Medicare Other | Admitting: Physical Therapy

## 2011-01-10 ENCOUNTER — Encounter: Payer: PRIVATE HEALTH INSURANCE | Admitting: Physical Therapy

## 2011-01-13 NOTE — Discharge Summary (Signed)
  NAMEADALIZ, DOBIS NO.:  [id number]  MEDICAL RECORD NO.:  [id number]           PATIENT TYPE:  I  LOCATION:  3730                         FACILITY:  [facility]  PHYSICIAN:  Paula Compton, MD        DATE OF BIRTH:  1922/10/29  DATE OF ADMISSION:  12/17/2010 DATE OF DISCHARGE:  12/19/2010                              DISCHARGE SUMMARY   ADDENDUM  This is an addendum to follow up the patient's fasting lipid profile, LDL 120 per pertinent labs obtained for TIA workup.  Addendum to recommendation:  Recommend starting Crestor 40 mg p.o. daily with goal LDL less than 70.    ______________________________ Dessa Phi, MD   ______________________________ Paula Compton, MD    JF/MEDQ  D:  12/22/2010  T:  12/23/2010  Job:  [id number]  cc:   Antonieta Iba, MD  Electronically Signed by Dessa Phi MD on 01/05/2011 10:14:00 AM Electronically Signed by Paula Compton MD on 01/13/2011 09:46:13 AM

## 2011-01-13 NOTE — Discharge Summary (Signed)
[name], [name]:  [id number]  MEDICAL RECORD NO.:  [id number]           PATIENT TYPE:  I  LOCATION:  3730                         FACILITY:  [facility]  PHYSICIAN:  Paula Compton, MD        DATE OF BIRTH:  August 05, 1922  DATE OF ADMISSION:  12/17/2010 DATE OF DISCHARGE:  12/19/2010                              DISCHARGE SUMMARY   DISCHARGE DIAGNOSES: 1. Transient ischemic attack, resolved. 2. History of atrial fibrillation. 3. History of congestive heart failure. 4. Venous stasis ulcer of lower extremity. 5. Hypertension.  PRIMARY CARE PROVIDER:  Marland Mcalpine A. Ellsworth Lennox, MD, in [place].  PRIMARY CARDIOLOGIST:  Antonieta Iba, MD, with [facility].  DISCHARGE MEDICATIONS:  New Medications:  Neomycin, bacitracin and polymyxin ointment one application topically as needed. Home Medications Continued: 1. Maxzide 37.5/25 one tab p.o. daily. 2. Atenolol 100 one tablet p.o. daily. 3. Digoxin 0.125 one tab p.o. daily at bedtime. 4. Doxycycline 100 mg for ulcers, to stop on Friday, December 20, 2010,     one tablet p.o. twice daily. 5. Lorazepam 0.5 mg one tab p.o. twice daily. 6. Omeprazole 40 mg one capsule p.o. twice daily. 7. Warfarin 2.5 mg one tablet p.o., goal INR 2.5-3.5 and warfarin 5 mg     one tablet p.o. Saturdays and Mondays, goal INR 2.5-3.5.  PERTINENT LABORATORY DATA:  The patient's INR is 2.37.  A1c 6.2. Cardiac enzymes were negative x2 sets.  PERTINENT STUDIES:  The patient had an echo done on December 18, 2010, showed an ejection fraction of 50-55%, AV moderately calcified with trivial mitral regurg. Other studies, CT head without contrast, December 17, 2010, showed linear fluid density at connectivity of the fourth ventricle with the cisterna magna that could be due to Dandy-Walker variant or remote prior infarction in the vicinity.  No overlying skull defect to suggest that this is a postop finding.  It was not felt to be acute.   There is no intracranial hemorrhage, mass, lesion, or acute infarct.  BRIEF HOSPITAL COURSE:  The patient is an 77 year old female who was admitted with complaint of left-sided facial droop and slurred speech that resolved by the time of admission. 1. TIA.  The patient was admitted for TIA and stroke workup was     obtained.  As previously mentioned, her workup was within normal     limits.  The patient was not previously on aspirin, aspirin was     started during her hospital stay; however, since the patient is on     Coumadin, it was recommended that aspirin be discontinued and the     Coumadin be continued with a new goal INR of 2.5-3.5, up from     previous goal of 2-3.  The patient did not have a fasting lipid     profile done and notes that direct LDL was ordered at the time of     discharge to be followed up on an outpatient basis.  The patient     also was evaluated by PT and OT.  It was recommended that she have     outpatient  physical therapy for persisting gait instability and a     prescription be provided for followup for vestibular rehab. 2. AFib.  The patient was in AFib during her hospital stay but     rate controlled on her home regimen of digoxin and atenolol as well     as paced.  From cardiac standpoint, she was stable throughout     admission. 3. CHF.  The patient's systolic function was normal.  Currently, she     is compensated.  She did not acquire diuresis during her     admission.  As previously mentioned, cardiovascularly she was     stable throughout her hospital course. 4. Lower extremity venous stasis ulcer.  The patient is on Doxy for     the lower extremity venous stasis ulcers.  She was continued on her     course of Doxy to be completed by Friday.  She also was evaluated     by wound care who applied ointment and dressed her wounds.  At     discharge, she was instructed to continue with ointment dressing     and is to continue the doxycycline until  Friday and follow up with     her cardiologist for treatment of her venous stasis ulcers. 5. Hypertension.  The patient's blood pressure was well controlled     throughout her hospital stay on her home antihypertensive regimen.     There were no changes made to her antihypertensive regimen.  FOLLOWUP ISSUES AND RECOMMENDATIONS: 1. The patient is to follow up as an outpatient for vestibular rehab     with physical therapy. 2. Please follow up the patient's direct LDL to assess for     hyperlipidemia.  DISCHARGE CONDITION:  The patient was discharged to home in stable medical condition. She was also instructed to consume a heart-healthy normal diet.  She has no limitations on activities except to walk with assistance as needed.    ______________________________ Dessa Phi, MD   ______________________________ Paula Compton, MD    JF/MEDQ  D:  12/19/2010  T:  12/20/2010  Job:  [id number]  cc:   Antonieta Iba, MD Silas Flood Ellsworth Lennox, MD in [facility]  Electronically Signed by Dessa Phi MD on 01/05/2011 10:13:39 AM Electronically Signed by Paula Compton MD on 01/13/2011 09:46:07 AM

## 2011-01-14 NOTE — Medication Information (Signed)
Summary: Coumadin Clinic  Anticoagulant Therapy  Managed by: Lanny Hurst Referring MD: Sherryl Manges PCP: Victorino December MD: Mariah Milling Indication 1: Aortic Valve Replacement (ICD-V43.3) Indication 2: Atrial Fibrillation (ICD-427.31) Lab Used: [facility] Site: [place] INR POC 2.8 INR RANGE 2.5-3.0  Dietary changes: no    Health status changes: no    Bleeding/hemorrhagic complications: no    Recent/future hospitalizations: no    Any changes in medication regimen? no    Recent/future dental: no  Any missed doses?: no       Is patient compliant with meds? yes       Allergies: 1)  ! Iodine 2)  ! Pcn 3)  ! * Latex Tape 4)  ! Sulfa 5)  ! Verapamil 6)  ! * Diltiazem 7)  ! Lasix 8)  ! * Advair  Anticoagulation Management History:      The patient is taking warfarin and comes in today for a routine follow up visit.  Positive risk factors for bleeding include an age of 75 years or older and history of CVA/TIA.  The bleeding index is 'intermediate risk'.  Positive CHADS2 values include History of CHF, Age > 75 years old, and Prior Stroke/CVA/TIA.  The start date was 08/19/2007.  Her last INR was 2.7.  Anticoagulation responsible provider: Verl Whitmore.  INR POC: 2.8.  Exp: 12/2011.    Anticoagulation Management Assessment/Plan:      The patient's current anticoagulation dose is Warfarin sodium 5 mg tabs: as directed Sat. & Mon., Warfarin sodium 2.5 mg tabs: Sun, Tues, Wed. Thurs. Friday.  The target INR is 2.5-3.0.  The next INR is due 02/04/2011.  Anticoagulation instructions were given to patient.  Results were reviewed/authorized by Lanny Hurst.  She was notified by Lanny Hurst RN.         Prior Anticoagulation Instructions: INR 2.6  Continue same dosage 1/2 tablet every day except 1 tablet on Mondays and Saturdays. Recheck in 2 weeks.  Current Anticoagulation Instructions: INR 2.8  Continue same dosage of 1/2 tablet every day  except 1 tablet on Mondays and Saturdays. Recheck in 4 weeks.

## 2011-01-16 ENCOUNTER — Encounter: Payer: PRIVATE HEALTH INSURANCE | Admitting: Physical Therapy

## 2011-01-17 ENCOUNTER — Ambulatory Visit: Payer: Medicare Other | Attending: Family Medicine | Admitting: Physical Therapy

## 2011-01-17 DIAGNOSIS — R42 Dizziness and giddiness: Secondary | ICD-10-CM | POA: Insufficient documentation

## 2011-01-17 DIAGNOSIS — R269 Unspecified abnormalities of gait and mobility: Secondary | ICD-10-CM | POA: Insufficient documentation

## 2011-01-17 DIAGNOSIS — IMO0001 Reserved for inherently not codable concepts without codable children: Secondary | ICD-10-CM | POA: Insufficient documentation

## 2011-01-21 ENCOUNTER — Ambulatory Visit: Payer: Medicare Other | Admitting: Physical Therapy

## 2011-01-23 ENCOUNTER — Ambulatory Visit: Payer: Medicare Other | Admitting: Physical Therapy

## 2011-01-29 ENCOUNTER — Encounter: Payer: Self-pay | Admitting: Cardiology

## 2011-01-29 ENCOUNTER — Encounter (INDEPENDENT_AMBULATORY_CARE_PROVIDER_SITE_OTHER): Payer: Medicare Other

## 2011-01-29 DIAGNOSIS — I4891 Unspecified atrial fibrillation: Secondary | ICD-10-CM

## 2011-01-29 DIAGNOSIS — Z7901 Long term (current) use of anticoagulants: Secondary | ICD-10-CM

## 2011-01-29 LAB — CONVERTED CEMR LAB: POC INR: 2.3

## 2011-02-01 LAB — BASIC METABOLIC PANEL
BUN: 19 mg/dL (ref 6–23)
GFR calc non Af Amer: >60 mL/min (ref >60)
Potassium: 4 mEq/L (ref 3.5–5.1)
Sodium: 134 mEq/L — ABNORMAL LOW (ref 135–145)

## 2011-02-04 NOTE — Medication Information (Signed)
Summary: ROV/ MES  Anticoagulant Therapy  Managed by: Cloyde Reams, RN, BSN Referring MD: Sherryl Manges PCP: Victorino December MD: Shirlee Latch MD, Lottie Siska Indication 1: Aortic Valve Replacement (ICD-V43.3) Indication 2: Atrial Fibrillation (ICD-427.31) Lab Used: [facility] Site: [place] INR POC 2.3 INR RANGE 2.5-3.0  Dietary changes: no    Health status changes: no    Bleeding/hemorrhagic complications: no    Recent/future hospitalizations: no    Any changes in medication regimen? no    Recent/future dental: no  Any missed doses?: no       Is patient compliant with meds? yes       Allergies: 1)  ! Iodine 2)  ! Pcn 3)  ! * Latex Tape 4)  ! Sulfa 5)  ! Verapamil 6)  ! * Diltiazem 7)  ! Lasix 8)  ! * Advair  Anticoagulation Management History:      The patient is taking warfarin and comes in today for a routine follow up visit.  Positive risk factors for bleeding include an age of 75 years or older and history of CVA/TIA.  The bleeding index is 'intermediate risk'.  Positive CHADS2 values include History of CHF, Age > 55 years old, and Prior Stroke/CVA/TIA.  The start date was 08/19/2007.  Her last INR was 2.7.  Anticoagulation responsible provider: Shirlee Latch MD, Azaya Goedde.  INR POC: 2.3.  Cuvette Lot#: [id number].  Exp: 11/2011.    Anticoagulation Management Assessment/Plan:      The patient's current anticoagulation dose is Warfarin sodium 5 mg tabs: as directed Sat. & Mon., Warfarin sodium 2.5 mg tabs: Sun, Tues, Wed. Thurs. Friday.  The target INR is 2.5-3.0.  The next INR is due 02/26/2011.  Anticoagulation instructions were given to patient.  Results were reviewed/authorized by Cloyde Reams, RN, BSN.  She was notified by Cloyde Reams RN.         Prior Anticoagulation Instructions: INR 2.8  Continue same dosage of 1/2 tablet every day except 1 tablet on Mondays and Saturdays. Recheck in 4 weeks.  Current Anticoagulation  Instructions: INR 2.3  Take 1 tablet today, then resume same dosage 1/2 tablet daily except 1 tablet on Mondays and Saturdays.  Recheck in 4 weeks.

## 2011-02-10 ENCOUNTER — Ambulatory Visit: Payer: Medicare Other | Admitting: Physical Therapy

## 2011-02-12 ENCOUNTER — Encounter: Payer: Self-pay | Admitting: Internal Medicine

## 2011-02-12 ENCOUNTER — Encounter: Payer: Medicare Other | Admitting: Physical Therapy

## 2011-02-12 DIAGNOSIS — G459 Transient cerebral ischemic attack, unspecified: Secondary | ICD-10-CM

## 2011-02-12 DIAGNOSIS — I4891 Unspecified atrial fibrillation: Secondary | ICD-10-CM

## 2011-02-12 DIAGNOSIS — I359 Nonrheumatic aortic valve disorder, unspecified: Secondary | ICD-10-CM

## 2011-02-17 ENCOUNTER — Other Ambulatory Visit: Payer: Self-pay | Admitting: Emergency Medicine

## 2011-02-17 ENCOUNTER — Ambulatory Visit: Payer: Medicare Other | Attending: Family Medicine | Admitting: Physical Therapy

## 2011-02-17 DIAGNOSIS — IMO0001 Reserved for inherently not codable concepts without codable children: Secondary | ICD-10-CM | POA: Insufficient documentation

## 2011-02-17 DIAGNOSIS — R269 Unspecified abnormalities of gait and mobility: Secondary | ICD-10-CM | POA: Insufficient documentation

## 2011-02-17 DIAGNOSIS — R42 Dizziness and giddiness: Secondary | ICD-10-CM | POA: Insufficient documentation

## 2011-02-17 MED ORDER — DIGOXIN 125 MCG PO TABS: 125.0000 ug | ORAL_TABLET | Freq: Every day | ORAL | Status: DC

## 2011-02-17 MED ORDER — ATENOLOL 100 MG PO TABS: 100.0000 mg | ORAL_TABLET | Freq: Every day | ORAL | Status: DC

## 2011-02-17 NOTE — Telephone Encounter (Signed)
rx sent into pharmacy

## 2011-02-19 ENCOUNTER — Encounter: Payer: Medicare Other | Admitting: Physical Therapy

## 2011-02-24 ENCOUNTER — Encounter: Payer: Medicare Other | Admitting: Physical Therapy

## 2011-02-24 LAB — URINALYSIS, ROUTINE W REFLEX MICROSCOPIC
Glucose, UA: NEGATIVE mg/dL
Hgb urine dipstick: NEGATIVE
Protein, ur: NEGATIVE mg/dL
pH: 5.5 (ref 5.0–8.0)

## 2011-02-24 LAB — BASIC METABOLIC PANEL
BUN: 14 mg/dL (ref 6–23)
Calcium: 8.7 mg/dL (ref 8.4–10.5)
GFR calc non Af Amer: >60 mL/min (ref >60)
Potassium: 3.8 mEq/L (ref 3.5–5.1)
Sodium: 137 mEq/L (ref 135–145)

## 2011-02-24 LAB — POCT CARDIAC MARKERS
CKMB, poc: 1.1 ng/mL (ref 1.0–8.0)
Myoglobin, poc: 115 ng/mL (ref 12–200)
Myoglobin, poc: 80.5 ng/mL (ref 12–200)
Troponin i, poc: <0.05 ng/mL (ref 0.00–0.09)

## 2011-02-24 LAB — D-DIMER, QUANTITATIVE: D-Dimer, Quant: 1.7 ug/mL-FEU — ABNORMAL HIGH (ref 0.00–0.48)

## 2011-02-26 ENCOUNTER — Ambulatory Visit (INDEPENDENT_AMBULATORY_CARE_PROVIDER_SITE_OTHER): Payer: Medicare Other | Admitting: Emergency Medicine

## 2011-02-26 ENCOUNTER — Encounter: Payer: Medicare Other | Admitting: Physical Therapy

## 2011-02-26 DIAGNOSIS — I359 Nonrheumatic aortic valve disorder, unspecified: Secondary | ICD-10-CM

## 2011-02-26 DIAGNOSIS — G459 Transient cerebral ischemic attack, unspecified: Secondary | ICD-10-CM

## 2011-02-26 DIAGNOSIS — I4891 Unspecified atrial fibrillation: Secondary | ICD-10-CM

## 2011-02-26 LAB — POCT INR: INR: 2.2

## 2011-03-03 ENCOUNTER — Encounter: Payer: Medicare Other | Admitting: Physical Therapy

## 2011-03-05 ENCOUNTER — Encounter: Payer: Medicare Other | Admitting: Physical Therapy

## 2011-03-19 ENCOUNTER — Ambulatory Visit (INDEPENDENT_AMBULATORY_CARE_PROVIDER_SITE_OTHER): Payer: Medicare Other | Admitting: Emergency Medicine

## 2011-03-19 DIAGNOSIS — G459 Transient cerebral ischemic attack, unspecified: Secondary | ICD-10-CM

## 2011-03-19 DIAGNOSIS — I4891 Unspecified atrial fibrillation: Secondary | ICD-10-CM

## 2011-03-19 DIAGNOSIS — I359 Nonrheumatic aortic valve disorder, unspecified: Secondary | ICD-10-CM

## 2011-04-01 NOTE — Op Note (Signed)
[name], [name]              ACCOUNT NO.:  [id number]   MEDICAL RECORD NO.:  [id number]          PATIENT TYPE:  AMB   LOCATION:  SDS                          FACILITY:  [facility]   PHYSICIAN:  Leonie Man, M.D.   DATE OF BIRTH:  Feb 10, 1922   DATE OF PROCEDURE:  04/19/2008  DATE OF DISCHARGE:  04/19/2008                               OPERATIVE REPORT   PREOPERATIVE DIAGNOSIS:  Squamous carcinoma of the right neck, status  post excision.   POSTOPERATIVE DIAGNOSIS:  Squamous carcinoma of the right neck, status  post excision.   PROCEDURES:  Re-excision, squamous carcinoma of the right neck.   SURGEON:  Leonie Man, MD   ASSISTANT:  OR tech.   ANESTHESIA:  MAC.   SPECIMENS TO LAB:  Skin, right neck.   ESTIMATED BLOOD LOSS:  Minimal.   COMPLICATIONS:  None.   The patient returned to the PACU in excellent condition.   Ms. Szwed is an 77 year old lady with a diagnosed squamous carcinoma of  the right neck which was excised by her dermatologist.  Pathology report  shows margins to be very close, and the patient comes now for re-  excision of the squamous carcinoma.   The patient understands the risks and potential benefits of surgery and  gives her consent to same.   PROCEDURE IN DETAIL:  Following the induction of satisfactory sedation  with the patient positioned supinely and neck turned towards the left,  the neck was prepped and draped to be included in a sterile operative  field.  The positive identification of the patient as [name] and  the procedure to be done as right neck re-excision of squamous carcinoma  was carried out.   I infiltrated the region around the carcinoma with 0.5% Marcaine with  epinephrine after having demarcated the region with a marking pen giving  adequate margins with an ellipse of skin.  The ellipse was then incised  down upon and dissected free taking full thickness of the skin and  subcutaneous tissues down to the  sternocleidomastoid muscle.  The lesion  was removed and a long suture was placed on the anterior margin and  double suture was placed on the superior margin for further  identification.  This was forwarded for pathologic evaluation.  Hemostasis was assured with electrocautery.  The skin and subcutaneous  tissues were closed with interrupted 3-0 Vicryl suture, and  the skin was closed with 5-0 Monocryl suture.  The wound was reinforced  with Dermabond.  Sterile dressings were applied.  The patient was  removed from the operating room to the recovery room in stable  condition.  She tolerated the procedure well.      Leonie Man, M.D.  Electronically Signed     PB/MEDQ  D:  04/19/2008  T:  04/20/2008  Job:  [id number]

## 2011-04-01 NOTE — Assessment & Plan Note (Signed)
[facility] OFFICE NOTE   NAME:[name], [name]                       MRN:          [id number]  DATE:04/17/2009                            DOB:          December 04, 1921    REASON FOR VISIT:  Shortness of breath.   HISTORY OF PRESENT ILLNESS:  [name] is an 77 year old woman who is  well-known to our practice.  She has a history of permanent atrial  fibrillation status post permanent pacemaker as well as diastolic heart  failure and chronic dyspnea.  She was in the office today for Coumadin  check and complained of shortness of breath.  She is scheduled to see  Dr. Graciela Husbands next week, but she was added on to clinic for further  evaluation in the setting of her shortness of breath.   The patient describes slowly progressive shortness of breath that occurs  periodically.  She thinks that her symptoms may be related to anxiety,  but wanted to be evaluated in case she was developing recurrent problems  related to heart failure.  The patient also describes fleeting episodes  of sharp left shoulder pain.  They last for less than 1 minute.  The  symptoms are nonexertional and are not associated with other problems.  She denies palpitations, edema, orthopnea, or PND.  She has no other  complaints today.   HOME MEDICATIONS:  1. Warfarin as directed.  2. Triamterene/hydrochlorothiazide 37.5/25 mg daily.  3. Ativan 0.5 mg twice daily (taken only at bedtime by the patient).  4. Multivitamin once daily.  5. Digoxin 0.125 mg daily.  6. Atenolol 100 mg b.i.d.   ALLERGIES:  Multiple include IODINE, PENICILLIN, SULFA, LATEX,  VERAPAMIL, and DILTIAZEM.   PHYSICAL EXAMINATION:  GENERAL:  The patient is alert and oriented, very  pleasant woman in no acute distress.  VITAL SIGNS:  Blood pressure is 150/70, heart rate 75, respiratory rate  16.  HEENT:  Normal.  NECK:  Normal carotid upstrokes.  Jugular venous pressure is normal.  There is mild distention of the external jugular veins.  LUNGS:  Clear.  HEART:  Irregularly irregular without murmurs or gallops.  ABDOMEN:  Soft, nontender.  No organomegaly.  EXTREMITIES:  No clubbing, cyanosis, or edema.   EKG shows atrial fibrillation with alternating bundle branch block  likely related to intermittent pacing.   ASSESSMENT:  Shortness of breath.  I think the patient's symptoms are  multifactorial.  Her atrial fib rate appears to be well controlled.  She  carries a diagnosis of chronic obstructive pulmonary disease, but she  does not currently use a maintenance regimen for her lungs.  She has not  had physical exam findings consistent with this diagnosis, and I  question whether this is contributing.  Her volume status appears normal  on exam today.  She clearly has an anxiety component related to her  symptoms, and I have given her permission to take her Ativan twice daily  instead of once daily since this is how the medication was prescribed to  her.  This may help with controlling anxiety during  the day.  She will  keep her regularly scheduled followup with Dr. Graciela Husbands next week.     [name]. [name], MD  Electronically Signed    MDC/MedQ  DD: 04/17/2009  DT: 04/18/2009  Job #: [id number]   cc:   Duke Salvia, MD, [facility]

## 2011-04-01 NOTE — Assessment & Plan Note (Signed)
[facility]                            CARDIOLOGY OFFICE NOTE   NAME:WICKERLouine, [name]                       MRN:          [id number]  DATE:10/15/2007                            DOB:          [birth year]    IDENTIFICATION:  [name] is a patient who is followed in our  [place] office.  She was most recently seen by Olga Millers with  complaints of dizziness found to be not orthostatic, placed on Antivert.   Note, her recent discharge from the hospital on verapamil she had  constipation.  She was switched to diltiazem.   The patient called in today because she is not dizzy but she is  nauseated, her appetite is down, she is having diarrhea.  Stool is  yellow, no blood in stool, no fevers.   I have talked to the niece.  Have recommended:  1. Restart verapamil.  Stop diltiazem.  2. Continue Antivert.  3. Will call in prescription for Zofran.  4. Have [place] office call early in week.   Patient is agreeable to this.     Pricilla Riffle, MD, [facility]  Electronically Signed    PVR/MedQ  DD: 10/15/2007  DT: 10/15/2007  Job #: [id number]   cc:   Thomas C. Wall, MD, [facility]

## 2011-04-01 NOTE — Letter (Signed)
August 28, 2008    [name] Main, MD  [facility]  [address]  [place], [place]  [address]   RE:  [name], [name]  MRN:  [id number]  /  DOB:  September 22, 1922   Dear Dr. Ellsworth Lennox:   I hope letter finds you well.  [name] came in to see me for her  pacemaker followup after her recent hospitalization for heart failure  and COPD exacerbation.  Her complaints today are that she is wobbly.  This is accompanied by dizziness and is aggravated by standing, but not  eliminated by sitting.   Interestingly, this is concurrent with a reinitiation of DILTIAZEM, a  medication that we have listed as an allergy for her.  This was resumed  because of rapid rates in the context of atrial fibrillation.   In addition to the wobbliness, her post-hospitalization complaints  include peripheral edema.  She is otherwise a good deal better from her  breathing point of view.   MEDICATIONS:  1. Include warfarin.  2. Triamterine/hydrochlorothiazide 37.5/25.  3. Atenolol 100 b.i.d.  4. Albuterol.   EXAMINATION:  VITAL SIGNS:  Today her blood pressure was mildly elevated  150/83.  Her pulse was 81.  Her weight was 148, which is up 5 pounds in  2 weeks.  Orthostatics were undertaken and were notable for no  appreciable change in heart rate, although there is significant wobbling  and swaying.  Her pulse through the Dynamap went from 91 down to 69 up  to 98 and then down to 56, and I suspect that this, however, represents  inadequate sensing of mechanical pulses.  LUNGS:  Clear.  HEART:  Sounds were irregular with a 2/6 murmur.  EXTREMITIES:  Had 1 to 2+ edema.   Interrogation of her Medtronic pulse generator demonstrated that her R  wave was 11.2 with impedance of 544, threshold 0.75 at 0.4.  What was  notable is that, while she is 44% ventricular paced, she also has 22% of  her heart beats faster than 100 beats per minute.   IMPRESSION:  1. Atrial fibrillation - permanent with an  inadequately controlled      ventricular response.  2. Recent hospitalization for shortness of breath felt to be related      to chronic obstructive pulmonary disease and congestive heart      failure in the setting of previously identified normal systolic      function.  3. Status post aortic valve replaced with a bioprosthetic valve.  4. Hypertension.  5. Gait instability.  6. Edema.   [name], Dr. Ellsworth Lennox, has a rapidly connecting atrial fibrillation  for which the DILTIAZEM was reinitiated.  Concurrent with that  reinitiation there have been these symptoms of dizziness and progressive  edema, and I think we have to presume that the DILTIAZEM is  contributing.  Thus, recommended to her that we stop that.  Some of it  also may be related to her albuterol, and since she does not carry a  firm diagnosis of COPD that I know of, I went ahead and took the liberty  of stopping it; I should note that her lungs were clear today.   I further took the liberty of starting her on Lanoxin.  It has not been  a medication I have used a lot but recently saw some papers where, in  conjunction with atenolol particularly, it had a fairly profound effect  on heart rate slowing, which is not necessarily what I  would have  expected.  I will plan to reassess this in about 6 weeks' time by using  her heart rate histograms from her pacemaker and it may well be that  alternatives to medical therapy will need to be considered.   She is to see you in 10 days' time and I asked her to ask you to get a  digoxin level.  She may well need other blood work done at that time for  renal function, et Product manager.   Thanks very much for allowing [place] to participate in her care.  I look  forward to talking with you about her if there is anything I can do to  help.    Sincerely,      Duke Salvia, MD, [facility]  Electronically Signed    SCK/MedQ  DD: 08/28/2008  DT: 08/28/2008  Job #: [id number]

## 2011-04-01 NOTE — Assessment & Plan Note (Signed)
[facility] OFFICE NOTE   NAME:[name], [name]                       MRN:          [id number]  DATE:08/15/2008                            DOB:          May 22, 1922    [name] returns today for further management of rate control with her  atrial fib.  She is feeling remarkably better with her heart rate is  running in the 70s.  She is taking atenolol 100 mg the morning, 100 mg  at night and she is also taking diltiazem CD 120 mg at night.  She says  if she takes her diltiazem during the day, she feels very fatigued.  She  is also on Coumadin with a recent INR that was therapeutic.   PHYSICAL EXAMINATION:  VITAL SIGNS:  Her blood pressure today is 138/68,  her pulse is 75 and irregular.  EKG with and without the magnet shows no  change with a right bundle left anterior fascicular block.  HEENT:  Normal otherwise.  NECK:  Carotid upstrokes were equal bilaterally without bruits.  Thyroid  is not enlarged.  LUNGS:  Clear to auscultation and percussion.  HEART:  Irregular rate and rhythm.  ABDOMEN:  Soft, good bowel sounds.  No midline bruit.  EXTREMITIES:  No cyanosis, clubbing, but chronic edema.  Pulses are  present but reduced.  She has varicose veins, but no sign of DVT.   I am delighted that [name] is doing better.  I have made no changes  in her medical program.  For a complete list of her problems, please  refer to my note on Mar 31, 2008.  We will see her back in 6 months.     Thomas C. Daleen Squibb, MD, [facility]  Electronically Signed    TCW/MedQ  DD: 08/15/2008  DT: 08/16/2008  Job #: [id number]   cc:   Yves Dill

## 2011-04-01 NOTE — Progress Notes (Signed)
[facility] ARRHYTHMIA ASSOCIATES' OFFICE NOTE   NAME:[name], [name]                       MRN:          [id number]  DATE:04/23/2009                            DOB:          19-Nov-1921    [name] is seen in followup for her pacemaker implanted for  tachybrady syndrome in the setting of what is now permanent atrial  fibrillation.   She has had complaints over the last couple of weeks of progressive  shortness of breath.  Review of the old chart, demonstrated that she was  hospitalized in September 2009 for similar complaints and the evaluation  was felt to be consistent with diastolic dysfunction.  She has had  undergone catheterization about 5 years before that, which was notable  for normal obstructive coronary artery disease, moderate pulmonary  hypertension, moderate mitral insufficiency, moderate to severe  dilatation of the ascending aorta, and it was felt that diastolic  dysfunction was responsible.  She saw Dr. [name] last week for similar  complaints.  He advised that she take her diuretic.   I should note that she carries on her chart a diagnosis of LASIX allergy  related to liver damage, although the specifics of this are not  available.   Her other medications include digoxin 0.125 and atenolol 100 b.i.d.   On examination, her blood pressure was mildly elevated at 150/70; her  pulse was 86; her weight was 143, which is 6 pounds up from February.  Her neck veins were 6-7 cm.  Her lungs were clear.  Her heart sounds  were irregular with a 2/6 murmur.  The abdomen was soft.  The  extremities had trace edema with her support stockings in place.   Interrogation of Medtronic adaptive pulse generator demonstrates that  she is in the VVI mode with a battery voltage of 2.77 with an estimated  longevity of 7 years.  The R-wave was 11 with pacing impedance of 562.  Heart rate excursion was good.  Threshold was 0.75  at 0.4.   IMPRESSION:  1. Atrial fibrillation - permanent.  2. Tachybrady syndrome status post pacemaker implantation.  3. Congestive heart failure - acute on chronic - diastolic.  4. Pulmonary hypertension with moderate mitral regurgitation.  5. Ascending aortic dilatation.   [name] is doing fine from arrhythmia point of view.  I talked with  Dr. Jimmey Ralph from the pharmacy department.  We will put her on ethacrynic  acid for 3 days to see if we cannot treat  some of her shortness of breath with augmented diuresis.  She is to let  us know next week how  she is doing.  We will see her again in 6 months' time in the device  clinic, and she will follow up with Dr. Daleen Squibb as otherwise scheduled.     Duke Salvia, MD, [facility]  Electronically Signed    SCK/MedQ  DD: 04/23/2009  DT: 04/24/2009  Job #: [id number]

## 2011-04-01 NOTE — Progress Notes (Signed)
[name] ARRHYTHMIA ASSOCIATES' OFFICE NOTE   NAME:[name], [name]                       MRN:          [id number]  DATE:10/30/2008                            DOB:          1922-03-24    [name] is seen in followup for atrial fibrillation which is  permanent.  She is much better since we saw her in October when we  discontinued the diltiazem and added digoxin.  She is not sure her  digoxin level has been checked.   Her other complaint is sore legs.  She wears support stockings all the  time if there is a little bit of peripheral edema.   MEDICATIONS:  1. Warfarin.  2. Hydrochlorothiazide/triamterene.  3. Ativan 0.5.  4. Atenolol 100/50.  5. Digoxin 0.125.  6. Prilosec.   PHYSICAL EXAMINATION:  VITAL SIGNS:  Her blood pressure was well  controlled at 144/72, her pulse was 68, her weight was 143 which is down  4 pounds, but this still represent a 15-pound weight gain over the last  year.  LUNGS:  Clear.  NECK:  Veins were flat.  HEART:  Heart sounds were regular.  ABDOMEN:  Soft.  EXTREMITIES:  Without edema.  There were venous insufficiency changes  and an erosion on her right shin, those were about a couple of  millimeters in diameter.   IMPRESSION:  1. Atrial fibrillation - Permanent.  2. Status post pacemaker for bradycardia with now well controlled      ventricular response.  3. Digoxin therapy, question level.  4. Lower extremity venous issues.   I have asked [name] to follow up with Dr. __________ about her  digoxin level.  If it has not been done, we will be glad to do it here.  Otherwise, we will plan to see her again in three months' time.   As relates to her lower extremities, I suggest that she follow up with  him as well or perhaps she could see a vein specialist.  I have also  suggested that she not be wearing her support  stockings while she has had erosion and that she keep that treated  with  bandage and some antibiotic ointment.     Duke Salvia, MD, [name]  Electronically Signed    SCK/MedQ  DD: 10/30/2008  DT: 10/31/2008  Job #: [id number]

## 2011-04-01 NOTE — Assessment & Plan Note (Signed)
[facility]   NAME:  DAVID, TOWSON              ACCOUNT NO.:  [id number]   MEDICAL RECORD NO.:  [id number]      DATE OF BIRTH:  12/13/1921   PHYSICIAN:  Theresia Majors. Tanda Rockers, M.D. VISIT DATE:  11/01/2007                                   OFFICE VISIT   SUBJECTIVE:  Ms. Lascano is an 77 year old lady who we are seeing in  follow-up for a right medial lower extremity ulceration.  In the interim  she has worn a compressive wrap.  There has been no excessive drainage,  malodor, pain or fever.  She continues to be ambulatory.   OBJECTIVE:  Blood pressure is 133/55, respirations 16, pulse rate 84,  temperature 97.4.  Inspection of the lower extremity shows a persistence  of ecchymosis but there is no active ulceration.  The edema is well-  controlled and judged at 1+ to trace. The capillary refill is brisk.  There is no evidence of ischemia.   ASSESSMENT:  Resolved stasis ulcer.   PLAN:  We are discharging the patient. We have given her prescription  for bilateral below-the-knee open toe 30-40 mm compression hose.  We  have explained the use of these stockings in the prevention of stasis  and ulceration.  We have given the patient and her daughter an  opportunity to ask questions.  They seem to understand, express  gratitude for having been seen in the clinic and indicate that they will  be compliant.      Harold A. Tanda Rockers, M.D.  Electronically Signed     HAN/MEDQ  D:  11/01/2007  T:  11/02/2007  Job:  [id number]   cc:   Luis Abed, MD, [facility]

## 2011-04-01 NOTE — Assessment & Plan Note (Signed)
[facility] OFFICE NOTE   NAME:WICKERCheyan, Frees                       MRN:          [id number]  DATE:06/01/2008                            DOB:          [birth year]    Ms. Lanter comes in today because of tachy palpitations and shortness of  breath.   She has chronic atrial fibrillation.  Please see my note from Mar 31, 2008, for extensive problem list.   She denies any orthopnea.  She has had no angina.  She denies any blood  loss on Coumadin.  She has a pacemaker and as noted in my previous  notes.   Her medicines are:  1. Atenolol 100 mg in the morning, 50 mg in the evening.  2. Ativan 0.5 mg daily.  3. Protonix 40 mg a day.  4. Triamterene HCTZ 37.5/25 daily.  5. Warfarin as directed.   She is in no acute distress.  She is mildly short of breath when she  talks.  Her blood pressure is 142/77, heart rate is 114 by EKG and she  remains in atrial fib with interventricular conduction delay and a right  bundle with a left axis.  I can see no pacemaker spikes.  There are no  acute changes or ST-segment changes.  Neck shows no JVD.  Carotid  upstrokes are full.  Lungs are clear.  No rales.  No rhonchi.  No  wheezes.  Heart reveals an irregular rate and rhythm that is increased.  Abdominal exam is soft, good bowel sounds.  Extremities reveal some  ecchymoses.  No significant edema.  Pulses are present.  Neuro is  intact.   ASSESSMENT:  Chronic atrial fibrillation now with an increased  ventricular rate.  There is no clear etiology for this.  We need to rule  out occult bleeding on Coumadin.   PLAN:  1. I have told the patient to take an additional 50 mg of atenolol      when she gets home.  She will take it easy this afternoon and use      Ativan for tension and stress.  This evening, she will take an      additional 100 mg of atenolol.  Tomorrow, will go to 100 mg b.i.d.      We will give her a call  tomorrow to see how she is feeling.  2. Check baseline CBC, pro time, and BMET.   I will have her come back early next week for followup EKG and brief  clinical visit.  Hopefully, we can keep her out of the hospital.     Maisie Fus C. Daleen Squibb, MD, [facility]  Electronically Signed   TCW/MedQ  DD: 06/01/2008  DT: 06/02/2008  Job #: [id number]

## 2011-04-01 NOTE — Assessment & Plan Note (Signed)
[facility] OFFICE NOTE   NAME:[name], Caputo                       MRN:          [id number]  DATE:06/26/2008                            DOB:          June 07, 1922    [name] comes in today.  She has multiple complaints and just does  not feel well in general.  This is kind of her usual persona.   Specifically, she does have fatigue and just gives out going up a hill.  She denies any tachy palpitations, presyncope, or syncope.  She does  have what sounds like some vertigo.   She just had an abdominal ultrasound, which showed her abdominal aortic  aneurysm to be stable in dimensions of 4.6 x 4.6.  It was a suboptimal  study.   Her ventricular rate with her atrial fib has been in the 70s and 80s  since we increased her atenolol to 100 mg b.i.d.  She is intolerant to  VERAPAMIL and TOPROL.  I do not think digoxin will do any good as far as  slowing her rate.  Her other medications are stable as outlined in the  chart.   PHYSICAL EXAMINATION:  VITAL SIGNS:  Her blood pressure today is 120/78,  pulse is 80 and irregular, and weight is 145, which is stable.  SKIN:  Color is good.  Recent hemoglobin was 12.1.  HEENT:  Unchanged.  NECK:  No JVD.  Carotids are full.  LUNGS:  Clear without wheezing, rhonchi, or rales.  HEART:  Irregular rate and rhythm, well controlled.  ABDOMEN:  Soft.  EXTREMITIES:  Chronic edematous changes.  Pulses were present.  NEURO:  Intact.   ASSESSMENT AND PLAN:  [name] is pretty much about the same.  There  are a lot of complaints she has that are nonspecific and probably  related to age and her multifactorial problems.   I have reassured that she is doing pretty well from our standpoint.  We  will plan on seeing her back in 6 months.     Thomas C. Daleen Squibb, MD, [facility]  Electronically Signed    TCW/MedQ  DD: 06/26/2008  DT: 06/26/2008  Job #: [id number]

## 2011-04-01 NOTE — Discharge Summary (Signed)
NAMECORLEEN, OTWELL NO.:  [id number]   MEDICAL RECORD NO.:  [id number]          PATIENT TYPE:  REC   LOCATION:  FOOT                         FACILITY:  [facility]   PHYSICIAN:  Rollene Rotunda, MD, FACCDATE OF BIRTH:  [birth year]   DATE OF ADMISSION:  08/20/2007  DATE OF DISCHARGE:  08/23/2007                         DISCHARGE SUMMARY - REFERRING   DISCHARGE DIAGNOSES:  1. Syncope.  2. Atrial fibrillation with brady tachy syndrome.  3. Asystole requiring external and temporary pacemaker.  4. Right lower extremity wound.  5. Status post single-chamber Medtronic pacer inserted.  History is      noted below.   PROCEDURES PERFORMED:  Status post Medtronic Adapta pacemaker Dr. Graciela Husbands.   BRIEF HISTORY:  Ms. Summerfield is an 77 year old female who had recently  begun on amiodarone by a cardiologist in [place] secondary to atrial  fibrillation.  However, on the day of admission, the patient had two  witnessed episodes of syncope lasting each for 30 seconds.  The episodes  were preceded by dizziness.  However, on the day of admission, she also  complained of some chest discomfort in the left inframammary area which  she describes as sharp.  She does not have exertional chest discomfort.   PAST MEDICAL HISTORY:  1. Hypertension.  2. 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  3. Thoracic aneurysm.  4. Abdominal aortic aneurysm status post resection.  5. Hypertension.  6. Kidney cyst.  7. Atrial fibrillation.   She was initially admitted by Dr. Ashley Royalty, and Dr. Myrtis Ser was consulted  on August 21, 2007.  Dr. 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   LABORATORY DATA:  Admission H&H 11.6 and 35.1, normal indices,  platelets  191, WBCs 4.8.  On August 22, 2007, H&H was 11.2 and 33.9, normal  indices, platelets 155, WBC 7.4.  Admission PT was 71.4 with INR of 8.1  and D-dimer 1.17.  At the time of discharge on August 23, 2007, PT was  27.9, INR 2.5.  Admission I-STAT showed a sodium of 135, potassium 4.5,  BUN 24, creatinine 1.2, glucose 101.  On August 19, 2007, chemistry  showed a sodium 136, potassium 4.1, BUN 17, creatinine 1.19, glucose  153.  LFTs were within normal limits except for an alkaline phosphatase  of 125.  On August 22, 2007, potassium was 4.5, BUN 23, creatinine 1.06.  CK-MBs, relative indexes were within normal limits x4.  Troponins were  0.02, 0.19, 0.02, and 0.05.  TSH was 2.49.   Chest x-ray on August 18, 2007 showed cardiomegaly without failure  status post bypass surgery, peripheral right mid-lung atelectasis or  scar.  Chest x-ray on August 12, 2007 showed chronic cardiomegaly  status post median sternotomy, new 1-lead cardiac pacemaker with the tip  in the right ventricle, bilateral small pleural effusions, bilateral  basilar atelectasis.  On admission, an MR of the chest was performed and  showed prior ascending aortic repair, diffuse aneurysmal dilatation of  the aortic arch and descending thoracic aorta measuring 4.4, small  bilateral pleural effusions with portions loculated in the major  fissures bilaterally, cardiomegaly,  large complex cyst within the left  kidney most compatible with a hemorrhagic cyst, recommend followup with  a MR in 6-12 months; benign-appearing hepatic and right renal cyst.  EKGs showed baseline artifact.  Several EKGs showed an atrial flutter  pattern with wide QRS complexes.   HOSPITAL COURSE:  [facility] the [facility]  and asked Dr. Myrtis Ser to consult.  In regards to her syncope, her  amiodarone was discontinued.  Toprol-XL was also discontinued secondary  to slow ventricular rate.  Coumadin was held.  Given her  elevated INR,  it was felt that amiodarone contributed to this.  Progression nurse  assisted with discharge needs.  Wound care became involved with the  patient on August 19, 2007 for a right lower extremity wound.  Pharmacy  assisted with Coumadin management.  Dr. Myrtis Ser reviewed the patient's  outside records and spent over 45 minutes with the patient and her  family.  However, on the afternoon of the August 19, 2007, apparently  the patient had an asystolic episode requiring external pacing.  An  emergent temporary pacer placed by Dr. [name].  Given this, arrangements  were made for consultation with EP for pacer placement.  Dr. Graciela Husbands,  after review, also agreed with pacer placement.  On August 20, 2007, a  Medtronic single-chamber pacer was implanted without difficulty.  Dr.  Graciela Husbands felt that she could potentially be discharged in the morning.  Echocardiogram was performed on August 20, 2007 and showed an EF of 55%  without wall motion abnormalities.  She had a bioprosthetic aortic valve  without evidence of perivalvular leak, slight anterior prolapse of the  mitral valve with moderate MR, bilateral atrial enlargement, decreased  RV function, PASP was slightly increased, mild-to-moderate TR.  The  patient was complaining of some muscular pain, especially at the pacer  site.  Her Foley was discontinued and her activity increased.  Followup  chest x-ray was performed.  Her Toprol-XL was advanced.  Dr. Diona Browner  felt that she should stay off her Altace and amiodarone, and she was  transferred to telemetry.  Pharmacy assisted with Coumadin management;  however, on review, Dr. Tenny Craw felt amiodarone should be possibly added  back to her medical regimen.  On August 22, 2007, with potential  discharge pending, Joni Reining spent a considerable amount of time  reviewing with the patient and family her concerns about her  medications, blood pressure, progression, and followup.  It is noted   that the patient had a right lower extremity wound.  Thus, arrangements  for wound care were to be performed prior to her being discharge on  August 23, 2007.  The right lower extremity wound was reevaluated, and  an Unna boot was reapplied.  The patient apparently has an appointment  at the wound care clinic on August 24, 2007 for continued treatment.  After review by Dr. Antoine Poche on August 23, 2007, it was felt the patient  could be discharged home.   DISPOSITION:  Loura Pardon arranged followup.  Wound care and activities  are guided per post pacer defibrillator patients.  Her Coumadin will be  5 mg every day, except for 2.5 mg Monday, Wednesday, and Friday.  She  will have a PT/INR at the Coumadin Clinic in the [facility]  office on  August 25, 2007 at 10:30 a.m.  She was asked to continue metoprolol 50  mg in the morning and 25 mg in the evening, and Percocet 5/325 mg every  6 hours as needed for pain.  She will have a followup appointment at the  Wound Care Clinic on Tuesday, August 24, 2007.  She will also follow up  in the pacer clinic in the [facility] office on Wednesday, September 08, 2007 at 9:20 and in the  [place] office with Dr. Graciela Husbands on September 02, 2007 at 2:15 p.m.  Please note that prior to her discharge, ortho will be changing her Unna  boot, and then she will be free to be discharged home.   TIME SPENT AT DISCHARGE:  45 minutes.      Joellyn Rued, PA-C      Rollene Rotunda, MD, [facility]  Electronically Signed    EW/MEDQ  D:  08/23/2007  T:  08/23/2007  Job:  [id number]   cc:   Duke Salvia, MD, [facility]  [facility]ound Care Clinic

## 2011-04-01 NOTE — Assessment & Plan Note (Signed)
[facility] OFFICE NOTE   NAME:[name], [name]                       MRN:          [id number]  DATE:01/05/2008                            DOB:          06-02-1922    [name] returns today for close follow-up of her tachybrady syndrome  and chronic atrial fibrillation.   Her biggest issue had been diarrhea with diltiazem and constipation and  lack of appetite with verapamil.  We finally discontinued both.  She is  now on atenolol 100 mg the morning and 50 mg in the evening.   Herbert Seta is also having a hard time controlling her anticoagulation.  She  is currently the process of reviewing instructions on that and adjusting  her dosage.  She was therapeutic last week at 2.5; however, today she is  subtherapeutic.   She feels remarkably better.  She has gained 3 pounds.   Her blood pressure is 126/58, her pulse is 64 and regular.  There is no JVD.  Carotids are full.  LUNGS:  Clear.  HEART:  Reveals a regular rate and rhythm.  No gallop.  ABDOMEN:  Soft.  EXTREMITIES:  No edema.  Pulses are present.   [name] is doing better from our standpoint.  If we get her Coumadin  regulated, we will be in a very good holding pattern.  We will plan on  seeing her back again in 3 months.     Thomas C. Daleen Squibb, MD, [facility]  Electronically Signed    TCW/MedQ  DD: 01/05/2008  DT: 01/06/2008  Job #: [id number]   cc:   Brendia Sacks, MD

## 2011-04-01 NOTE — Consult Note (Signed)
NAMERAEGAN, WINDERS NO.:  [id number]   MEDICAL RECORD NO.:  [id number]          PATIENT TYPE:  INP   LOCATION:  2908                         FACILITY:  [facility]   PHYSICIAN:  Luis Abed, MD, FACCDATE OF BIRTH:  07-20-1922   DATE OF CONSULTATION:  DATE OF DISCHARGE:                                 CONSULTATION   The patient is admitted here and she is the relative of one of the folks  we work with at [employer]. The patient had lived in [place], [place] in  the past, and was treated at the [facility] for an ascending  aortic aneurysm. Ultimately, she came to [facility] for  respiratory treatment after this surgery because her family was here in  town and then she went back to [place]. After that, she has moved  recently to the [place] area. Dr. Iona Coach had cared for  at [facility] and then continued to follow her medically in [place], and  she has seen Dr. Park Breed in [place]. She made it clear to everyone  yesterday that if she had any in-hospitalization care, she wanted it at  [facility]. She was admitted here with significant bradycardia and she  is now stabilizing. Her INR was also markedly elevated and this is being  treated. The patient and her family have very extensive, very complete  records from prior care including prior care in [facility] and also  prior care from the [facility]. She saw Dr. Marty Heck who we  know in echo at the [facility].   PROBLEMS:  1. History of question of a knee procedure needed in the past that led      to the diagnoses of her cardiac disease. Around the time of the      knee procedure, she did have deep venous thrombosis.  2. Status post major cardiothoracic surgery in 2006. She received a 21      millimeter pericardial tissue valve along with a hemi arch      procedure. This is a procedure that involves replacement of the      ascending aorta in the underside of part of  the arch. This went      well.  3. History of seizures, postoperative surgery and this improved over      time.  4. Atrial fibrillation. It has been called paroxysmal at times, but      she is on Coumadin for this.  5. History of a descending thoracic aortic aneurysm at 4.5 sonometers      that was measured in March 2008 and seemed to be similar.  6. Status post respiratory failure treated at [facility] after      her surgery in 2006.  7. History of a renal mass that is considered a complex cyst.  8. History of hepatic cyst.  9. History of pulmonary nodules for which she had a VATS procedure in      2005 and they were benign.  10.History of normal coronary arteries.  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  12.Hypertension.  13.History of some polymyalgia  rheumatica.  14.Osteoporosis.  15.Coumadin therapy for her atrial fibrillation.  16.Status post Amiodarone therapy. It is my understand that this was      started in [place]. I do not have the specifics. With her marked      bradycardia at this time, she will be kept off of Amiodarone. If      she feels well, and she returns to atrial fibrillation, she will be      treated with rate control.      Luis Abed, MD, [facility]  Electronically Signed     JDK/MEDQ  D:  08/19/2007  T:  08/21/2007  Job:  [id number]

## 2011-04-01 NOTE — Assessment & Plan Note (Signed)
[facility]   NAME:  [name], [name]              ACCOUNT NO.:  [id number]   MEDICAL RECORD NO.:  [id number]      DATE OF BIRTH:  [birth year]   PHYSICIAN:  Maxwell Caul, M.D.      VISIT DATE:                                   OFFICE VISIT   LOCATION:  [facility].   [name] is a very pleasant 77 year old woman who has recently moved  here with her husband from [place], [place] to be closer to  family.  She tells me that she saw her dermatologist on July 19 for a  nodular area on her right lower leg.  Ultimately, this proved to be a  carcinoma, although I do not have these records.  This is apparently  excised.  Unfortunately, the area did not heal over.  In fact, it  developed into ulcers with quite significant surrounding pain.  When she  came to this area, she was seen by Dr. Doristine Section of Dermatology year.  He initially treated her with azithromycin for a surrounding cellulitis.  This was on August 12.  Once again, on August 26, she was seen in Dr.  Koleen Nimrod office.  DuoDerm was applied to the wound.  On August 27, the  patient reported increasing pain in her leg.  Septra and rifampin were  prescribed for 10 days.  An x-ray was done that did not suggest  osteomyelitis.   [name] continues to complain of severe pain surrounding the wound.  She has not noted any fever or drainage.  She has a fairly long history  of venous stasis and has some venous stasis wound scars on the left leg,  however, she tells me that these largely healed fairly easily.   PAST MEDICAL HISTORY:  1. 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  2. Lung biopsy 2004.  3. Open heart surgery with heart valve replacement in 2005, remains on      Coumadin.  4. Brain aneurysm surgery.  5. History of hepatitis.  6. History of anxiety.  7. History of gastroesophageal reflux.  8. Hypertension.   MEDICATIONS:  Her only medications are metoprolol 25, 1  pill b.i.d.,  warfarin 6 mg a day, ramipril 10 daily, Ativan 0.5 b.i.d.   SOCIAL HISTORY:  Socially, she lives with her husband who sounds be  quite disabled.  They have surrounding family who are very involved.   PHYSICAL EXAMINATION:  VITAL SIGNS:  On examination, temperature 97.6,  pulse 58, respirations 18, blood pressure 160/68.  She is not a  diabetic.  RESPIRATORY:  Clear air entry bilaterally.  CARDIAC:  There is a 2/6 systolic ejection murmur at the lower left  sternal border.  This does not radiate.  There are no signs of  congestive heart failure.  EXTREMITIES:  Peripheral pulses in the left leg actually were fairly  robust, palpable at both the dorsalis pedis and posterior tibial on her  right leg but certainly not as robust at the left.  Nevertheless, there  is no obvious acute ischemia.  She has very significant venous stasis  bilaterally.   WOUND EXAM:  The area on her right leg measured 1.2 x 0.8 x 0.1.  This  was covered with a yellowish eschar, some surrounding blackened tissue  and probably about 2-cm diameter.  Beyond on the rim of this, there was  erythema.  The entire area was painful, even beyond what was obvious.  She clearly does have some superficial phlebitis in the lesser saphenous  vein distribution, accounting for some of this.  There is peri-wound  edema and redness as noted above.   IMPRESSION:  1. Right surgical wound, right medial leg.  I think the delayed      healing here is mediated largely by venous stasis.  I did not      palpate anything that made me worry about recurrent tumor, and she      has already seen Dr. Joseph Art.  2. Possible peri-wound cellulitis.  It would be difficult not to look      at this and think of cellulitis, yet she has already been through a      course of azithromycin, and then Septra and rifampin.  I have gone      ahead and put her on doxycycline, although in my mind I am      wondering if this is non-infectious.  3.  Probably mild-to-moderate peripheral vascular insufficiency on the      right.  She is not obviously symptomatic from this, and I think she      should be able to handle a Profore Lyte wrap.  4. Superficial phlebitis.  How much of her current leg pain is due to      this, I am really uncertain.  She is not a candidate for      nonsteroidals, because of the Coumadin.  I will see what the wrap      does, in terms of possibly providing her some relief in the pain.      I have also given her a prescription for hydrocodone/APAP.   The wound was debrided with a #15 blade, EMLA for anesthesia.  Hemostasis:  Direct pressure.  She was provided a prescription for  doxycycline 100 b.i.d. for 14 days and Hydrocodone/APAP.  We will see  her again in 5 days.   WOUND CARE DRESSING:  As noted above.           ______________________________  Maxwell Caul, M.D.     MGR/MEDQ  D:  08/06/2007  T:  08/07/2007  Job:  [id number]   cc:   Karie Soda. Joseph Art, M.D.

## 2011-04-01 NOTE — Discharge Summary (Signed)
[name], [name]:  [id number]   MEDICAL RECORD NO.:  [id number]          PATIENT TYPE:  INP   LOCATION:  2001                         FACILITY:  [facility]   PHYSICIAN:  Luis Abed, MD, FACCDATE OF BIRTH:  April 29, 1922   DATE OF ADMISSION:  09/13/2007  DATE OF DISCHARGE:  09/17/2007                         DISCHARGE SUMMARY - REFERRING   DISCHARGE DIAGNOSES:  1. Shortness of breath secondary to acute on chronic diastolic      congestive heart failure.  2. Atrial fibrillation with a rapid ventricular rate.  3. Hypotension with a history of hypertension.  4. Supratherapeutic INR without evidence of bleeding complications.  5. Status post single chamber Medtronic pacer secondary to sinus      arrest and tachybrady syndrome.   HISTORY:  As noted per Past Medical History listed below.   SUMMARY OF HISTORY:  [name] is an 77 year old white female who  presented with progressive dyspnea for the preceding week.  She  initially attributed to anxiety and eventually called Dr. Henrietta Hoover office  and the nurse advised her to present to the emergency room for further  evaluation.  It was noted that she was recently discharged from the  hospital on October 6th after she presented with tachybrady syndrome,  significant sinus pauses and received a pacer insertion by Dr. Graciela Husbands.  Post procedure medications have been adjusted secondary to atrial  fibrillation and her uncontrolled ventricular response.  However,  apparently the patient had not been taking all of her medications, thus  her admission.   PAST MEDICAL HISTORY:  Quite extensive.  This includes:  1. A DVT around a knee procedure, pericardial tissue valve with a hemi-      arch procedure in 2006 (this involves replacement of an ascending      aorta and underside part of the arch).  2. A history of postoperative seizures which has improved over time.  3. Atrial fibrillation with anticoagulation.  4. Descending  thoracic aneurysm at 4.5 cm in March, 2008.  5. Respiratory failure treated at [facility] in 2006, post her      surgery.  6. Renal mass that is considered a complex cyst.  7. Hepatic cyst.  8. Pulmonary nodules for which she has had a VATS procedure in 2005,      determined to be benign.  9. Normal coronary arteries.  10.IODINE ALLERGY RESULTING IN ANAPHYLAXIS.  11.Hypertension,.  12.Polymyalgia rheumatica.  13.Osteoporosis.  14.Prior amiodarone treatment.  15.Status post Medtronic Adapta ADDSRO pulse generator insertion by      Dr. Graciela Husbands on October 3rd.  16.Stasis ulcers in the left medial lower extremity for which she is      followed at the wound care clinic.   LABORATORY DATA:  On the 27th, chest x-ray showed bilateral effusions  with bibasilar atelectasis, probable scarring in a right mid lung zone,  ectatic thoracic aorta, question enlargement of the right lobe of the  thyroid.  Repeat chest x-ray on the 30th did not show any significant  change, bibasilar atelectasis and scarring as well as effusion.  Admission H&H was 11.6 and 35.8,  normal indices, platelets 231,000, WBCs  4.6.  On admission, PT was 36.9 with an INR of 3.6 and a PTT of 46.  On  the 31st, at the time of discharge, PT was 25.2 with an INR of 2.2.  ISTAT showed an admission sodium of 137, potassium 43, BUN 11,  creatinine 1.2.  At the time of discharge sodium was 139, potassium 39,  BUN 23, creatinine 1.8, glucose 91.  BNP on admission was 393, total  cholesterol 142, triglyceride 63, HDL 57, LDL 72.  EKG on admission  showed atrial fibrillation, left axis deviation with a ventricular rate  of 97, delayed R wave, right bundle branch block, left anterior  fascicular block.   HOSPITAL COURSE:  Joni Reining, Nurse Practitioner, and Dr. Jens Som  admitted [name] to [facility] for further treatment of her  shortness of breath.  It was felt that she was in congestive heart  failure.   Diuresis continued.  Loura Pardon noted on September 15, 2007,  that her Roland Rack boot could be remove on the 30th as her wound was almost  completely healed.  On the 30th, Dr. Myrtis Ser noted some low blood pressure  and medications were adjusted.  By the 30th, she felt much better, the  pedal edema had dissipated.  However, he felt that her blood pressure  remained low; thus, medications were again adjusted.  Dr. Myrtis Ser had the  [facility] boot removed.  Wound Care assisted with this and arranging for  followup at the Wound Center.  Pharmacy assisted with Coumadin  management.  Loura Pardon assisted with followup arrangements in  [facility] with Dr. Daleen Squibb.  By the 31st, Dr. Myrtis Ser on review felt that the  patient could be discharged home.   DISPOSITION:  Patient is discharged home.   NEW MEDICATIONS:  1. Tenormin 100 mg daily.  2. Verapamil 120 mg daily.  3. Potassium 20 mEq daily.  4. Edecrin 50 mg daily.  5. She was given permission to continue her Coumadin, which is 5 mg      daily except for 2.5 mg on Monday, Wednesday and Friday.  6. She was given permission to continue Protonix 40 mg daily, Ativan      0.5 mg at bedtime and her inhalers as previously.   She will have a BMET and a PT INR checked at the [place] office on  Friday, November 7th at 10 a.m.  She was asked to bring all medications  to all followup appointments.  She will follow up with Dr. Daleen Squibb in  [place] on September 27, 2007, at 11 a.m. and with Dr. Graciela Husbands on  November 02, 2007, at 12:15.   DISCHARGE TIME:  40 minutes.      Joellyn Rued, PA-C      Luis Abed, MD, [facility]  Electronically Signed    EW/MEDQ  D:  09/17/2007  T:  09/17/2007  Job:  [id number]   cc:   Dr. Limmie Patricia, M.D.  Juanito Doom, M.D.  Harold A. Tanda Rockers, M.D.

## 2011-04-01 NOTE — Assessment & Plan Note (Signed)
[facility]   NAME:  [name], [name]              ACCOUNT NO.:  [id number]   MEDICAL RECORD NO.:  [id number]      DATE OF BIRTH:  [birth year]   PHYSICIAN:  Theresia Majors. Tanda Rockers, M.D. VISIT DATE:  08/16/2007                                   OFFICE VISIT   SUBJECTIVE:  [name] is an 77 year old lady with a right lower  extremity stasis ulcer.  In the interim, she has been treated with a  Profore light and silver matrix dressing.  There has been no interim  excessive drainage, malodor, pain or fever.  There has been some  tenderness with ambulation however and moderate swelling.The patient was  seen in the emergency room for shortness of breath and fluid retention  in the interim between visits.  She continues to be under the care of  her primary care physician.   OBJECTIVE:  Blood pressure is 124/64, respirations 16, pulse rate 87,  temperature 97.6.  Inspection of the right lower extremity shows that the ulcer is  persistent.  There is a serous drainage with a halo of necrosis which  was mechanically debrided with a 4x4. The skin is extremely dry.  Capillary refill is present; there is no evidence of critical ischemia.   ASSESSMENT:  Persistent stasis ulcer.   PLAN:  We will apply triamcinolone cream and an Unna boot to the right  lower extremity.  We have given the patient a prescription for  triamcinolone cream to be applied also to the right lower extremity.  We  will add bilateral below-the-knee 30-40 mm compression hose to her  regimen as soon as the right lower extremity wound is resolved. In the  interim she can begin her fitting for the hose and to begin wearing them  on her right leg.   We have counseled the patient to keep her legs elevated as much as  possible.  We will reevaluate her in 1 week.      Harold A. Tanda Rockers, M.D.  Electronically Signed     HAN/MEDQ  D:  08/16/2007  T:  08/16/2007  Job:  [id number]

## 2011-04-01 NOTE — Op Note (Signed)
NAMEBREELYN, ICARD NO.:  [id number]   MEDICAL RECORD NO.:  [id number]          PATIENT TYPE:  REC   LOCATION:  FOOT                         FACILITY:  [facility]   PHYSICIAN:  Duke Salvia, MD, FACCDATE OF BIRTH:  [birth year]   DATE OF PROCEDURE:  08/20/2007  DATE OF DISCHARGE:                               OPERATIVE REPORT   PREOPERATIVE DIAGNOSIS:  Atrial fibrillation - permanent status post  aortic valve replacement and surgical maze with symptomatic bradycardia.   POSTOPERATIVE DIAGNOSIS:  Atrial fibrillation - permanent status post  aortic valve replacement and surgical maze with symptomatic bradycardia.   PROCEDURE:  Single chamber pacemaker implantation.   COMPLICATIONS:  Arterial puncture x1.   Following obtaining informed consent, the patient was brought to the  electrophysiology laboratory and placed on the fluoroscopic table in the  supine position. After routine prep and drape of the left upper chest,  lidocaine was infiltrated in the prepectoral subclavicular region.  An  incision was made and carried down to the layer of the prepectoral  fascia using electrocautery and sharp dissection. A pocket was formed  similarly, hemostasis was obtained.   Thereafter attention was turned to gaining access to extrathoracic left  subclavian vein which was accomplished without difficulty however, I did  puncture the artery on one occasion and held pressure for 3 minutes.  We  then got access to the extrathoracic left subclavian vein,  placed a 7-  French sheath and through this was passed a Medtronic 5076, 52-cm active  fixation ventricular lead, serial number [id number].  Under  fluoroscopic guidance, it was manipulated to the right ventricular  septum where the bipolar R wave was 7.2 with a pace impedance of 801, a  threshold of 1 volt at 0.5. The current threshold was 1.0, there was no  diaphragmatic pacing at 10 volts and the current of injury was  brisk.   This lead was secured to the prepectoral fascia and then attached to a  Medtronic Adapta ADD SRO 1 pulse generator serial number [id number].  Intermittent ventricular pacing was identified.  The pocket was  copiously irrigated with antibiotic containing saline solution.  Hemostasis was assured and the lead and the pulse generator were then  placed and the pocket secured to the prepectoral fascia. The wound was  closed in three layers in the  normal fashion.  The wound was then washed, dried and a benzoin and  Steri-Strip dressing was applied.  Needle counts, sponge counts, and  instrument counts were correct at the end of the procedure according to  the staff.  The patient tolerated the procedure without apparent  complication.      Duke Salvia, MD, [facility]  Electronically Signed     SCK/MEDQ  D:  08/20/2007  T:  08/21/2007  Job:  [id number]   cc:   Lorelle Formosa  Electrophysiology Lab

## 2011-04-01 NOTE — Assessment & Plan Note (Signed)
[facility]   NAME:  DASHAYLA, THEISSEN              ACCOUNT NO.:  [id number]   MEDICAL RECORD NO.:  [id number]      DATE OF BIRTH:  [birth year]   PHYSICIAN:  Theresia Majors. Tanda Rockers, M.D. VISIT DATE:  09/06/2007                                   OFFICE VISIT   SUBJECTIVE:  Ms. Cornick is an 77 year old lady whom we have followed for  a stasis ulcer involving the left medial lower extremity.  In the  interim she has worn a compressive Unna wrap with triamcinolone cream.  There has been no excessive drainage, malodor, pain, or fever.   OBJECTIVE:  Blood pressure is 130/63, respirations 20, pulse of 100,  temperature is 97.5.  Inspection of the lower extremity shows that the stasis ulcer is  decreased in volume.  There is no evidence of active infection,  excessive drainage, malodor, or ischemia.   ASSESSMENT:  Clinical improvement with control of edema.   PLAN:  We will continue the Unna wrap and reevaluate the patient in 10  days.      Harold A. Tanda Rockers, M.D.  Electronically Signed     HAN/MEDQ  D:  09/06/2007  T:  09/07/2007  Job:  [id number]

## 2011-04-01 NOTE — Assessment & Plan Note (Signed)
[facility]                            CARDIOLOGY OFFICE NOTE   NAME:[name], [name]                       MRN:          [id number]  DATE:08/19/2007                            DOB:          1922-04-25    The patient is admitted here and she is the relative of one of the folks  we work with at [employer]. The patient had lived in [place], [place] in  the past, and was treated at the [facility] for an ascending  aortic aneurysm. Ultimately, she came to [facility] for  respiratory treatment after this surgery because her family was here in  town and then she went back to [place]. After that, she has moved  recently to the [place] area. Dr. Iona Coach had cared for  at [facility] and then continued to follow her medically in [place], and  she has seen Dr. Park Breed in [place]. She made it clear to everyone  yesterday that if she had any in-hospitalization care, she wanted it at  [facility]. She was admitted here with significant bradycardia and she  is now stabilizing. Her INR was also markedly elevated and this is being  treated. The patient and her family have very extensive, very complete  records from prior care including prior care in [facility] and also  prior care from the [facility]. She saw Dr. Marty Heck who we  know in echo at the [facility].   PROBLEMS:  1. History of question of a knee procedure needed in the past that led      to the diagnoses of her cardiac disease. Around the time of the      knee procedure, she did have deep venous thrombosis.  2. Status post major cardiothoracic surgery in 2006. She received a 21      millimeter pericardial tissue valve along with a hemi arch      procedure. This is a procedure that involves replacement of the      ascending aorta in the underside of part of the arch. This went      well.  3. History of seizures, postoperative surgery and this improved over      time.  4. Atrial fibrillation. It has been called paroxysmal at times, but      she is on Coumadin for this.  5. History of a descending thoracic aortic aneurysm at 4.5 sonometers      that was measured in March 2008 and seemed to be similar.  6. Status post respiratory failure treated at [facility] after      her surgery in 2006.  7. History of a renal mass that is considered a complex cyst.  8. History of hepatic cyst.  9. History of pulmonary nodules for which she had a VATS procedure in      2005 and they were benign.  10.History of normal coronary arteries.  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  12.Hypertension.  13.History of some polymyalgia rheumatica.  14.Osteoporosis.  15.Coumadin therapy for her atrial fibrillation.  16.Status post Amiodarone therapy. It is my understand  that this was      started in [place]. I do not have the specifics. With her marked      bradycardia at this time, she will be kept off of      Amiodarone. If she feels well, and she returns to atrial      fibrillation, she will be treated with rate control.     Luis Abed, MD, [facility]  Electronically Signed    JDK/MedQ  DD: 08/19/2007  DT: 08/20/2007  Job #: [id number]

## 2011-04-01 NOTE — Assessment & Plan Note (Signed)
[facility] OFFICE NOTE   NAME:[name], Silvey                       MRN:          [id number]  DATE:10/08/2007                            DOB:          November 21, 1921    [name] is a patient of Dr. Nanine Means, who has a history of atrial  fibrillation as well as pacemaker placement.  She has had a previous  pericardial tissue valve along with hemi-arch procedure at [facility].  She states that for the past three days, she has had  intermittent dizziness.  This is predominantly when she lies flat or  turns her head,but it can also occur with standing.  She has also had  nausea.  There has been no fevers or chills, productive cough, or  hemoptysis.  Dr. Daleen Squibb recently changed her verapamil to Cardizem due to  constipation.   We did examine [name] today, and she was not orthostatic.  Her blood  pressure in the lying position was 154/71, and her pulse was 60.  In the  5 minute standing position, she was 153/74 with a pulse of 60.  There  were no focal neurological findings on examination.   I think [name] most likely has vertigo.  We have given her Antivert  25 mg p.o. b.i.d.  She will also need to follow up with her primary care  physician concerning this issue.  We will also schedule her to have a  noncontrast head CT to rule out bleed, given that she is on Coumadin.     Madolyn Frieze Jens Som, MD, [facility]  Electronically Signed    BSC/MedQ  DD: 10/08/2007  DT: 10/08/2007  Job #: [id number]

## 2011-04-01 NOTE — Assessment & Plan Note (Signed)
[facility]                         GASTROENTEROLOGY OFFICE NOTE   NAME:WICKERSherrill, [name]                       MRN:          [id number]  DATE:11/22/2007                            DOB:          [birth year]    CHIEF COMPLAINT:  An 77 year old white female self-referred for  evaluation of nausea, vomiting, anorexia, weight loss, and a change in  bowel habits.   HISTORY OF PRESENT ILLNESS:  [name] relocated to [place], [place] from [place], [place] a few months ago.  She has multiple  medical problems as outlined in the past medical history.  She has  records from prior gastrointestinal evaluations by Dr. [facility] in [place], [place].  She underwent a  complete colonoscopy in January of 2005 that showed marked left-sided  diverticulosis and internal hemorrhoids.  She underwent an incomplete  colonoscopy in July of 2007 that showed marked left colon diverticular  disease.  The exam was complete only to about 50 cm at which point there  was a sharp angulation, and the procedure was discontinued.  She has had  prior upper endoscopies, with the most recent endoscopy performed in  July of 2005 that showed mild chronic gastritis.  She has had recurrent  problems with tachybrady syndrome and atrial fibrillation and has had  multiple cardiology visits over the past few months, with a recent  change in medications including  verapamil.  She relates cycles of  nausea, vomiting, anorexia, and diarrhea occurring about every 4-5 days  with then 4-5 better days.  This has been occurring for the past 4  weeks.  She had noted problems with constipation, but more recently she  has had intermittent loose stools.  She notes a 13 pounds weight loss  since November.  She relates no melena, hematochezia, change in stool  caliber, dysphagia, odynophagia, or heartburn.  She has had worsening  left flank pain that she  attributes to a recurrent problem with a left  renal cyst.  She states she recently underwent an ultrasound examination  in [place] but does not know the results.  Her primary physician in  Dr. Ellsworth Lennox in [place].  She sees Dr. Valera Castle and Dr. Sherryl Manges for cardiology.   PAST MEDICAL HISTORY:  1. Hypertension.  2. Tachybrady syndrome.  3. Atrial fibrillation  4. Status post ascending aortic aneurysm repair with aortic valve      replacement in 2006.  5. Descending thoracic aneurysm.  6. Status posterior abdominal aortic aneurysm repair.  7. Status post cholecystectomy.  8. Status post hysterectomy.  9. Status post breast biopsy.  10.Status post aortic brain aneurysm repair.  11.Diverticulosis.  12.Internal hemorrhoids.  13.Chronic gastritis.  14.Chronic Coumadin anticoagulation.  15.Status post pacemaker implantation in October of 2008   CURRENT MEDICATIONS:  Listed on the chart, updated, and reviewed.   ALLERGIES:  LATEX, IODINE, PENICILLIN, SULFA, AND LASIX.   SOCIAL HISTORY:  Per the handwritten form.   REVIEW OF SYSTEMS:  Per the handwritten form.   PHYSICAL EXAMINATION:  GENERAL:  Well-developed, well-nourished,  pleasant white female.  VITAL  SIGNS:  Height 5 feet 4 inches.  Weight 137.4 pounds.  Blood  pressure 124/60, pulse 84.  HEENT:  Anicteric sclerae.  Oropharynx clear.  CHEST:  Clear to auscultation bilaterally.  CARDIAC:  Soft systolic murmur.  ABDOMEN:  Soft, nontender, nondistended.  Normoactive bowel sounds.  No  palpable organomegaly or masses.  There is a well-healed midline  incision with an easily reducible ventral hernia.  EXTREMITIES:  Without clubbing, cyanosis, or edema.  NEUROLOGIC:  Alert and oriented x3.  Grossly nonfocal.   ASSESSMENT AND PLAN:  Nausea, vomiting, anorexia, weight loss, and  change in bowel habits.  In addition, she has worsening chronic left  flank pain which is likely related to her left renal cyst.  Her  symptoms  may be multifactorial.  I am concerned that the left renal cyst and  verapamil are contributing to her current complaints.  Need to exclude  gastritis and ulcer disease.  The risks, benefits, and alternatives to a  diagnostic upper endoscopy performed while on Coumadin anticoagulation  discussed with the patient, and she consents to proceed.  This will be  scheduled electively.  I have asked her to return to Dr. Daleen Squibb and Dr.  Graciela Husbands to attempt to change her verapamil.  She will need a urology  referral to further evaluate her left renal cyst.  Given her fairly  recent colonoscopies, we will not plan for repeat colonoscopy at this  time.     Venita Lick. Russella Dar, MD, [facility]  Electronically Signed    MTS/MedQ  DD: 11/22/2007  DT: 11/22/2007  Job #: [id number]   cc:   Thomas C. Wall, MD, [facility]

## 2011-04-01 NOTE — Assessment & Plan Note (Signed)
[facility] OFFICE NOTE   NAME:[name], [name]                       MRN:          [id number]  DATE:09/27/2007                            DOB:          04-Jul-1922    [name] comes to the [facility] today to establish with me as  her cardiologist.  She has been a former patient of Dr. Jerral Bonito and  also has seen Dr. Berton Mount.   She was discharged from the hospital September 17, 2007 after being  treated for acute on chronic diastolic heart failure.  She has a history  of atrial fibrillation with a rapid ventricular rate and is status post  Medtronic pacer by Dr. Graciela Husbands August 20, 2007.  She has had prior  amiodarone therapy because of bradycardia.  This was discontinued by Dr.  Myrtis Ser.   She has a whole host of other medical problems, which are listed on the  discharge summary, and also by an extensive note by Dr. Myrtis Ser August 19, 2007.   Importantly, she has had a history of pericardial tissue valve along  with a hemi-arch procedure at [facility].  She had respiratory  therapy and had to be transferred to [facility] to be taken off of  the ventilator.  She had normal coronary arteries at the time of that  procedure.  She also has a history of atrial fibrillation and has been  on anticoagulation for this, obviously.  She has multiple allergies,  including a SULFA allergy.  She has had anaphylaxis from IODINE.   Her biggest complaint is constipation today.  Verapamil seems to be a  new drug, which may be the culprit.   She also complains about how expensive edecrin is, but I told her she  could not take generic diuretics because of SULFA derivatives.   MEDICATIONS:  1. She is currently on Coumadin with an INR of 1.9 today here in      [place].  2. Potassium 20 mEq a day.  3. Pantoprazole 40 mg p.o. daily.  4. Edecrin 50 mg p.o. daily.  5. Lorazepam 0.5 nightly.  6. Verapamil  extended release 120 mg daily.  7. Atenolol 100 mg a day.  8. Milk of magnesia.  9. Stool softener.   She is a delightful lady in no acute distress.  Her blood pressure is 110/70, pulse 83, and she is in sinus rhythm with  PACs and ventricularly paced on EKG.  Her weight is 138.  She is  chronically ill.  Respiratory rate is 18 and unlabored.  HEENT:  Normocephalic, atraumatic.  PERRLA.  Extraocular motions intact.  Sclerae clear.  Facial symmetry is normal.  NECK:  Supple.  Carotid upstrokes are equal bilaterally with soft  systolic sounds.  Thyroid is not enlarged.  Trachea is midline.  LUNGS:  Clear, except for some crackles in both distal bases.  There is  no rhonchi or wheeze.  CARDIAC:  No S3 gallop.  ABDOMEN:  Soft.  Good bowel sounds.  No midline bruit.  EXTREMITIES:  Reveal an area along her  anterior right tibia that is  scarred and blue from a melanoma that was removed and it did not heal  properly.  I can just barely feel a dorsalis pedis pulse on the right  foot, but nothing on the left.  Her feet are actually fairly warm.  She  has slightly delayed capillary refill.  There is no sign of DVT.  She  has varicose veins.  NEURO:  Grossly intact.   I had a long talk getting to know [name].  The things I think I can  do today to help her include the following.  1. Discontinue her verapamil and place her on Diltiazem extended      release 120 daily.  2. Arterial Dopplers to assure [place] that she has good blood flow to her      legs.   She has an appointment with Dr. Graciela Husbands December 16 here in [facility] for  pacemaker followup.  I will then have Dr. Graciela Husbands schedule her to see me  back after that.     Thomas C. Daleen Squibb, MD, [facility]  Electronically Signed    TCW/MedQ  DD: 09/27/2007  DT: 09/27/2007  Job #: [id number]

## 2011-04-01 NOTE — Progress Notes (Signed)
[facility] OFFICE NOTE   NAME:[name], [name]                       MRN:          [id number]  DATE:11/02/2007                            DOB:          [birth year]    Mrs. [name] was seen following pacemaker implantation accomplished in  October for bradycardia in the setting of permanent atrial fibrillation  followed aortic valve replacement and surgical maze operation, which  failed to help her maintain sinus rhythm.  She is feeling much better  from an exercise intolerance point of view.   Her major complain now is a GI complaint that has been going on for some  weeks and for which she was seen in the ER last week and she is supposed  to be following up with Dr. Jenene Slicker in the next couple of days.   MEDICATIONS:  Notable for:  1. Intercurrent addition of Zofran p.r.n.  2. She is also on Edecrin down to 1 pill a day.  3. Potassium has also been down-titrated.   PHYSICAL EXAMINATION:  Her blood pressure is 118/64 with a pulse of 64.  Her lungs were clear.  Heart sounds were regular.  The wound was well-healed.  EXTREMITIES:  Without edema.   Interrogation of her Medtronic pulse generator demonstrates an R wave of  15.8 with an impedance of 569 and a threshold of 0.75 at 0.4.  The  battery voltage is 2.78.   IMPRESSION:  1. High-grade conduction disease.  2. Bradycardia.  3. Permanent atrial fibrillation.  4. Status post pacer for the above.  5. History of aortic valve replacement.  6. Issue of nausea and diarrhea.   Mrs. [name] is stable from the arrhythmia point of view.  Her pacemaker  is functioning well.   We will plan to see her again in 1 year's time.    Duke Salvia, MD, [facility]  Electronically Signed   SCK/MedQ  DD: 11/02/2007  DT: 11/03/2007  Job #: [id number]   cc:   Bluford Main, MD

## 2011-04-01 NOTE — Progress Notes (Signed)
[facility] OFFICE NOTE   NAME:[name], [name]                       MRN:          [id number]  DATE:11/02/2007                            DOB:          [birth year]    DICTATION CANCELED BY DR. Graciela Husbands.     Duke Salvia, MD, [facility]  Electronically Signed    SCK/MedQ  DD: 11/02/2007  DT: 11/03/2007  Job #: [id number]

## 2011-04-01 NOTE — Assessment & Plan Note (Signed)
[facility] OFFICE NOTE   NAME:[name], [name]                       MRN:          [id number]  DATE:09/14/2008                            DOB:          1922-10-04    [name] was seen in followup at the [facility] in  [place] on September 14, 2008.  She is a delightful 77 year old woman  with permanent atrial fibrillation.  She was seen in Coumadin Clinic  early in the week and was found to have a subconjunctival hemorrhage of  the right eye.  She was brought back for followup today for further  evaluation after she had the same symptoms in the left eye.  She has  some pain in the left eye as well.  She denies any vision changes.  Specifically, she denies diplopia, blurry vision, or photophobia.  She  has had no injury.  She denies a foreign body sensation.  She had  suboptimal rate control of her atrial fibrillation, which she saw Dr.  Graciela Husbands on August 28, 2008.  She also was having side effects from  diltiazem with edema and unsteady gait.  He stopped her diltiazem and  added low-dose Lanoxin.  She feels much better since this medicine  change was made.  Her gait stability has greatly improved.   MEDICATIONS:  1. Coumadin as directed.  2. Triamterene/hydrochlorothiazide 37.5/25 mg daily.  3. Ativan 0.5 mg daily.  4. Atenolol 100 mg b.i.d.  5. Digoxin 0.125 mg daily.  6. Multivitamin daily.  7. Prilosec daily.   PHYSICAL EXAMINATION:  GENERAL:  She is alert and oriented, in no acute  distress and elderly woman.  VITAL SIGNS:  Weight 147 pounds, blood pressure 132/60, and heart rate  64.  HEENT:  The pupils are equal and reactive.  In the right eye, the sclera  are anicteric, conjunctivae pink.  On the left lateral eye, there is  subconjunctival hemorrhage.  The medial eye is normal with anicteric  sclerae.  Oropharynx is clear.  HEART:  Irregularly irregular with well-controlled heart  rate.  LUNGS:  Clear bilaterally.  EXTREMITIES:  No edema.   ASSESSMENT:  Subconjunctival hemorrhage in an elderly woman on chronic  anticoagulation.  Her INR is actually subtherapeutic at 1.7.  She will  continue her current Coumadin dose and follow up with her  ophthalmologist next week.  I reassured her that I do not think this is  serious or vision threatening issue, but that she certainly should be  evaluated by her ophthalmologist.  If she has any vision changes, she  was asked to contact her ophthalmologist immediately.  She may have a  higher risk of this in the future since she requires anticoagulation and  will plan on continuing close monitoring of her INR.   With respect to her atrial fibrillation, her heart rate is much better  controlled on her current regimen.  We will continue without change.  She will follow up with Dr. Graciela Husbands as scheduled.     [name] Fells. [name], MD  Electronically Signed    MDC/MedQ  DD: 09/14/2008  DT: 09/15/2008  Job #: [id number]   cc:   Luna Fuse, MD

## 2011-04-01 NOTE — Assessment & Plan Note (Signed)
[facility]                         ELECTROPHYSIOLOGY OFFICE NOTE   NAME:WICKERBrittiney, Dicostanzo                       MRN:          [id number]  DATE:08/30/2007                            DOB:          05/16/1922    Ms. Cousineau was seen today in the clinic on August 30, 2007, at  patient's request, complaining of some rapid heart beats.  On  interrogation of her device today, which is a Medtronic model number  [id number] Adapta, date of implant was August 20, 2007, for tachybrady  syndrome and atrial fibrillation, her R waves measured 11.2 to 15.68 mV  with a ventricular capture threshold of 0.5 V at 0.4 msec and a  ventricular lead impedance of 646 Ohms.  Underlying rhythm today was an  atrial fibrillation, 99 to 117 beats a minute.  She is ventricularly  sensing 97.6% of the time.  She is on Coumadin therapy and her Coumadin  has been monitored in our [place] office.  When I looked at her  histogram it showed some rates, off-sensed rates up into the 150s.  The  patient is complaining, like I said, of rapid heart beats and some  shortness of breath with little exertion and fatigue.  I spoke with Dr.  Daleen Squibb, who was in the office today, and the patient was taking Toprol 50  mg in the morning and 25 mg in the evening.  He increased her evening  dose to 50 mg and she is going to follow up next Monday here with Dr.  Graciela Husbands.  Steri-Strips were removed.  Her wound is without redness or  edema, and she will be seen as follows.      Altha Harm, LPN  Electronically Signed      Duke Salvia, MD, [facility]  Electronically Signed   PO/MedQ  DD: 08/30/2007  DT: 08/31/2007  Job #: [id number]

## 2011-04-01 NOTE — Assessment & Plan Note (Signed)
[facility] OFFICE NOTE   NAME:WICKERNoha, Milberger                       MRN:          [id number]  DATE:12/19/2008                            DOB:          [birth year]    Ms. Lykens comes in today for followup.  She is feeling remarkably well.  Dr. Graciela Husbands saw her on October 30, 2008.  A digoxin level was checked  which was therapeutic.   She is taking MonaVie, which sounds like a multivitamin supplement.  I  have told her to be careful with this because it can interfere with  Coumadin most likely.  We will check an INR today.   Her medications are unchanged since her last visit.  Please refer the  maintenance medication list.   Her problem list is labeled on Mar 31, 2008 in my office note.   PHYSICAL EXAMINATION:  VITAL SIGNS:  Her blood pressure is 118/56.  Her  pulse is 76 and irregular.  Her weight is 137, down 6.  GENERAL:  She is in no acute distress.  Her exam is otherwise unchanged.   ASSESSMENT AND PLAN:  Ms. Camilo is doing well.  I have asked to follow  protimes very closely.  She will continue current medications.  I will  see her back again in 3 months.     Thomas C. Daleen Squibb, MD, [facility]  Electronically Signed    TCW/MedQ  DD: 12/19/2008  DT: 12/20/2008  Job #: [id number]

## 2011-04-01 NOTE — Assessment & Plan Note (Signed)
[facility]                         GASTROENTEROLOGY OFFICE NOTE   [name], [name]                       MRN:          [id number]  DATE:12/27/2007                            DOB:          Aug 23, 1922    This is a return office visit for nausea and constipation.  The  constipation has substantially improved since discontinuing verapamil.  She still takes Milk of Magnesia and stool softeners.  Her appetite has  returned to normal and her nausea has abated.  Her only ongoing  gastrointestinal complaint is that of constipation.  Please see my note  dated November 22, 2007, for additional history.   CURRENT MEDICATIONS:  Listed on the chart, updated and reviewed.   MEDICATION ALLERGIES:  LATEX, IODINE, PENICILLIN, SULFA and LASIX.   EXAM:  No acute distress.  Weight 132.2 pounds, down 5.2 pounds since her visit one month ago.  Blood pressure 114/64, pulse 72 and regular.  CHEST:  Clear to auscultation bilaterally.  CARDIAC:  Soft systolic murmur.  ABDOMEN:  Soft and nontender with normoactive bowel sounds.  She has a  well-healed midline incision with an easily-reducible ventral hernia.   ASSESSMENT AND PLAN:  Chronic constipation and diverticular disease.  She is advised to maintain a high-fiber diet with adequate fluid intake.  She may take a fiber supplement if it is beneficial.  Continue stool  softeners and Milk of Magnesia as needed.  Gastrointestinal followup  p.r.n.  Ongoing followup with her primary care physician.     [name]. Russella Dar, MD, [facility]  Electronically Signed    MTS/MedQ  DD: 12/29/2007  DT: 12/30/2007  Job #: [id number]

## 2011-04-01 NOTE — Assessment & Plan Note (Signed)
[facility] OFFICE NOTE   NAME:[name], [name]                       MRN:          [id number]  DATE:03/31/2008                            DOB:          May 24, 1922    [name] comes in today for follow-up.  She needs clearance to have a  dermatologist remove what sounds like a squamous cell carcinoma.   PROBLEM LIST:  1. Chronic atrial fibrillation, currently on rate control.  2. Anticoagulation which had been very difficult to manage even in our      Coumadin Clinic.  3. History of a pericardial aortic valve replacement with hemiarch      repair for an ascending aortic aneurysm in 2006.  This was followed      by prolonged ventilatory support at [facility].  Please see      my previous notes.  4. History of postoperative seizures.  5. History of transient ischemic attacks.  Please see recent note by      Dr. Graciela Husbands March 2009.  CT scan at that time was without acute      changes, and carotid Doppler showed nonobstructive plaque.  6. Peripheral vascular disease which is asymptomatic.  Her ABI on the      right is 0.78; left is 1 with diffuse plaque throughout.      Noninvasive studies October 05, 2007.  We are treating this      medically.  7. Normal left ventricular systolic function.  8. Normal coronary arteries  9. History of a Medtronic Adapta pacer by Dr. Graciela Husbands with stable      followup March 2009.  10.Abdominal aortic aneurysm 4.5, last measure March 2008.   She says she feels the best she has ever felt!  We think we have finally  gotten her medicines right.   CURRENT MEDICATIONS:  1. Warfarin as directed.  2. Triamterene HCTZ 37.5/25 daily.  3. Protonix 40 mg a day.  4. Ativan 0.5 daily.  5. Atenolol 100 mg in the morning and 50 mg at night.  6. Multivitamin.   ALLERGIES:  She has multiple drug allergies as outlined in previous  notes.   PHYSICAL EXAMINATION:  VITAL SIGNS:  Blood  pressure is excellent today  at 122/60.  Her pulse is 80 and irregular.  Her weight is 139.  GENERAL:  She looks remarkably good.  She is alert and oriented x3.  HEENT:  Unchanged.  Carotids are full with bilateral bruits.  Thyroid is  not enlarged.  Trachea is midline.  LUNGS:  Decreased breath sounds throughout.  CARDIAC:  Normal S1, S2 with variable rate.  She has no gallop.  She has  no diastolic murmur.  ABDOMEN:  Soft, good bowel sounds.  No obvious tenderness.  EXTREMITIES:  1+ edema on the left, none on the right.  Pulses were  palpable bilaterally, much less so on the right.  NEURO:  Exam is grossly intact.   ASSESSMENT AND PLAN:  I had a long talk with [name] today.  Will  schedule for an abdominal aortic ultrasound to  follow up on her  aneurysm.  1. She is having a wider excision of some skin cancer.  We will have      to stop her Coumadin.  The risk of having a stroke is slightly      increased.  I do not want to overlap with Lovenox.  We will get her      back on Coumadin as quickly as possible.  2. I have made no changes in her medical program.  We will plan on      seeing her back again in 3 months.     Thomas C. Daleen Squibb, MD, [facility]  Electronically Signed    TCW/MedQ  DD: 03/31/2008  DT: 03/31/2008  Job #: [id number]   cc:   Bluford Main, MD

## 2011-04-01 NOTE — Consult Note (Signed)
NAMEARISBEL, MAIONE NO.:  [id number]   MEDICAL RECORD NO.:  [id number]          PATIENT TYPE:  INP   LOCATION:  2908                         FACILITY:  [facility]   PHYSICIAN:  Reginia Forts, MD     DATE OF BIRTH:  [birth year]   DATE OF CONSULTATION:  08/19/2007  DATE OF DISCHARGE:                                 CONSULTATION   REQUESTING PHYSICIAN:  Dr. Betsey Holiday with [facility].   CHIEF COMPLAINT:  Syncope.   HISTORY OF PRESENT ILLNESS:  Mrs. Hendry is an 77 year old Caucasian  woman with a history of ascending aortic aneurysm status post  replacement and aortic valve replacement, chronic atrial fibrillation  and hypertension who presents with 2 episodes of syncope earlier in the  day.  The patient has had chronic atrial fibrillation for over 3 years  and has been treated with Toprol XL 25 mg daily and Coumadin .  During  the past 2 weeks she has had episodes of atrial fibrillation with rapid  ventricular rate up to 150 beats per minute.  One week ago at her  primary care physician's office a cardiologist recommended adding  amiodarone 800 mg daily with a taper of 200 mg each week.  In addition,  the patient discontinued her Altace in preparation for the amiodarone.  During the week she has noted progressive fatigue, dyspnea on exertion  and lightheadedness.  She was sent home with a Holter monitor to assess  the atrial fibrillation and rapid ventricular rate.  Earlier in the day  the patient was sitting and was feeling quite drowsy and subsequently  lost consciousness for 5 to 10 seconds.  She awoke and called EMS and  while sitting passed out again.  EMS noted heart rate in the 40s and she  was subsequently brought to the emergency room for further management.  Of note, the patient states that her atrial fibrillation has been  difficult to control with rates often becoming tachycardic and  bradycardic in rapid succession over the past 3 years.   PAST MEDICAL  HISTORY:  1. Squamous cell carcinoma on the right lower extremity status post      excision.  2. Lung biopsy in 2004.  3. Ascending aortic aneurysm repair with aortic valve replacement in      2006 done in [place].  4. Brain aneurysm surgery.  5. History of hepatitis.  6. History of anxiety.  7. Gastroesophageal reflux disease.  8. Hypertension.   ALLERGIES:  IODINE CONTRAST, PENICILLIN AND SULFA.   MEDICATIONS:  1. Coumadin 6 mg p.o. daily.  2. Amiodarone 600 mg as part of a taper.  3. Ativan 3.5 mg p.o. daily.  4. Albuterol MDI p.r.n.  5. Toprol XL 25 mg p.o. daily.   SOCIAL HISTORY:  The patient recently moved from [place] with her  husband to [place] to be closer to her family.  Her husband is  disabled.  She denies any tobacco, alcohol or drug use.   FAMILY HISTORY:  Notable for 3 sisters with atrial fibrillation.  No  premature coronary artery disease.   REVIEW OF SYSTEMS:  Notable  for arthritis and chronic bruising.  The  rest of the review of systems was reviewed and is negative.   PHYSICAL EXAMINATION:  VITAL SIGNS:  Temperature 96.9, pulse 44,  respiratory rate 16, blood pressure 158/58.  GENERAL:  The patient is awake, alert and oriented x3 in no acute  distress.  HEENT:  Normocephalic, atraumatic.  Pupils equal, round and reactive to  light.  Extraocular movements are intact.  NECK:  Shows bruit in the left internal carotid.  CARDIOVASCULAR:  Irregular rhythm.  Irregular rate.  Bradycardiac with  no evidence of a mechanical S2.  LUNGS:  Clear to auscultation bilaterally.  ABDOMEN:  Positive bowel sounds.  Soft, nontender and nondistended.  EXTREMITIES:  Showed no cyanosis, clubbing or edema.  There is a right  leg dressing at the site of a surgical excision.  SKIN:  Demonstrates multiple purpura on the upper extremities.  MUSCULOSKELETAL:  Demonstrates no joint deformities or effusions.  NEUROLOGIC:  Cranial nerves II-XII grossly intact.  No focal   musculoskeletal or sensory deficits.  PSYCHIATRIC:  Demonstrates normal affect.   Chest x-ray demonstrates bilateral atelectasis.  MRI of the chest and  abdomen demonstrates repair to ascending aorta with aortic arch aneurysm  of 4.4-cm and a descending aortic aneurysm of 3.9-cm.  EKG demonstrates  atrial fibrillation with bradycardia, left anterior vesicular block,  right bundle branch block and LVH.   LABORATORY DATA:  Demonstrate a white count of 4.8, hemoglobin of 11.6,  platelets of 191,000, BUN of 24, creatinine of 1.2, BNP of 468, D-Dimer  of 117, INR of 8.1.   ASSESSMENT/PLAN:  This is an 77 year old Caucasian woman with iatrogenic  syncope secondary to bradycardia.  I agree with holding Toprol XL and  amiodarone at this time.  The patient is tolerating the bradycardia.  There is no current requirement for a pacemaker acutely.  In the future,  the patient may require a permanent pacemaker to help with controlling  of her atrial fibrillation.  In the interim, the patient should undergo  echocardiogram and TSH in the morning and may require re-initiation of  Toprol XL in the next 48 hours as her heart rate improves.  In addition,  the patient's INR appears to be elevated due to the Coumadin amiodarone  interaction.  We will continue to follow.      Reginia Forts, MD  Electronically Signed     RA/MEDQ  D:  08/19/2007  T:  08/19/2007  Job:  [id number]

## 2011-04-01 NOTE — Assessment & Plan Note (Signed)
[facility]   NAME:  [name], [name]              ACCOUNT NO.:  [id number]   MEDICAL RECORD NO.:  [id number]      DATE OF BIRTH:  25-Aug-1922   PHYSICIAN:  Theresia Majors. Tanda Rockers, M.D. VISIT DATE:  08/30/2007                                   OFFICE VISIT   SUBJECTIVE:  [name] is an 77 year old lady whom we are following for  stasis ulcer involving her right lower extremity.  In the interim she  has worn an Radio broadcast assistant.  She returns for followup.  There has been no  interim drainage, malodor, pain, or fever.   OBJECTIVE:  Blood pressure is 131/74, respirations 18, pulse rate 110,  temperature 97.7.  Inspection of the right lower extremity shows that the ulcer continues  to contract.  There is advancing epithelium from the periphery.  The  edema is well-controlled.  There is no evidence of ischemia or a  ascending infection.   ASSESSMENT:  Clinical response to compression therapy.   PLAN:  We will re-place the Unna boot and reevaluate the patient in 1  week.  We anticipate that this wound should be healed and we will make a  transition to compressive hose.      Harold A. Tanda Rockers, M.D.  Electronically Signed     HAN/MEDQ  D:  08/30/2007  T:  08/31/2007  Job:  [id number]

## 2011-04-01 NOTE — Assessment & Plan Note (Signed)
[facility] OFFICE NOTE   NAME:[name], [name]                       MRN:          [id number]  DATE:12/22/2007                            DOB:          03/08/1922    [name] returns today for further management of the following issues:   1. History of tachybrady syndrome status post permanent pacemaker      implantation.  2. Chronic atrial fibrillation.  3. Anticoagulation.  4. Other problems as listed in previous notes which are extensive.   She continues to have significant problems with constipation.  She saw  Dr. Claudette Head on November 22, 2007.  Apparently he has done endoscopy  which is negative.  He was concerned that Verapamil may be contributing  obviously to her constipation.   When I first met [name] I changed her verapamil to diltiazem for  this very reason.  She then developed diarrhea and dizziness.  Her  dizziness is chronic and not orthostatic.   A CT scan for her dizziness back in November showed diffuse atrophy to  the left cerebellum.  I suspect this is the etiology of her dizziness  which I have explained to her today.   MEDICATIONS:  1. Coumadin as directed followed in our [facility] Clinic here in      [place].  2. Pantoprazole 40 mg a day.  3. Lorazepam 0.5 mg q.h.s.  4. Atenolol 100 mg q.a.m.  5. Verapamil Extended Release 120 daily.  6. Zofran 4 mg p.r.n.  7. Klor-Con 20 mEq every other day or p.r.n.  8. Edecrin 25 mg a day.   PHYSICAL EXAMINATION:  VITAL SIGNS:  Today blood pressure 145/78, pulse  73 and regular, weight is down several pounds to 128.  HEENT:  Unchanged.  NECK:  Shows no JVD.  Carotids are full.  LUNGS:  Clear.  HEART:  Reveals a regular rate and rhythm.  ABDOMEN:  Soft, good bowel sounds.  EXTREMITIES:  Reveal no edema.  Pulses are intact.   I have had a nice chat with Ms. Panetta today.  I have made it as clear  as I can that I think her  dizziness has been chronic from her cerebral  atrophy including cerebellar atrophy.  I have discontinued verapamil in  hopes of helping her constipation.  We have increased her atenolol to  100 mg in the morning and 50 in the evening.  She will follow up with  Dr. Russella Dar.  I will plan on seeing her back again in six months.     Thomas C. Daleen Squibb, MD, [facility]  Electronically Signed    TCW/MedQ  DD: 12/22/2007  DT: 12/23/2007  Job #: [id number]   cc:   Venita Lick. Russella Dar, MD, Mardi Mainland, M.D.

## 2011-04-01 NOTE — Assessment & Plan Note (Signed)
[facility] OFFICE NOTE   NAME:[name], [name]                       MRN:          [id number]  DATE:06/07/2008                            DOB:          26-Aug-1922    [name] returns today for followup of her atrial fib.  We saw her on  June 01, 2008, with increased ventricular rate at 114.  There was no  obvious reason for this.   We increased her atenolol to 100 mg p.o. b.i.d.  She says she feels  better.  Her hemoglobin was 12.1.  Her basic metabolic panel was normal.  Her INR was 2.7.   Her heart rate today is 81.  EKG was repeated and is stable except for  the reduced heart rate.   We will continue with 100 of atenolol b.i.d.  I will see her back again  as scheduled in August 2009.     Thomas C. Daleen Squibb, MD, [facility]  Electronically Signed    TCW/MedQ  DD: 06/07/2008  DT: 06/08/2008  Job #: [id number]

## 2011-04-01 NOTE — H&P (Signed)
[name], [name] NO.:  [name]   MEDICAL RECORD NO.:  [name]          PATIENT TYPE:  INP   LOCATION:  2001                         FACILITY:  [facility]   PHYSICIAN:  Madolyn Frieze. Jens Som, MD, FACCDATE OF BIRTH:  10-26-1922   DATE OF ADMISSION:  09/13/2007  DATE OF DISCHARGE:                              HISTORY & PHYSICAL   PRIMARY CARDIOLOGIST:  Dr. Jerral Bonito   PRIMARY CARE PHYSICIAN:  __________   HISTORY OF PRESENT ILLNESS:  This is an 77 year old Caucasian female  with known history of hypertension, tachybrady syndrome, atrial  fibrillation status post pacemaker implantation August 23, 2007 with  progressive dyspnea for the last week.  The patient said she thought it  was anxiety and waited all weekend and ended of calling Dr. Henrietta Hoover  office this morning and she spoke to his RN who advised ER admission.  The patient states that she has been having trouble with her heart rate  and her breathing status ever since being dismissed from the hospital  end of October after which she had had a tachybrady syndrome and had  pacemaker implantation secondary to asystole.  The patient has followed  up with Dr. Sherryl Manges post discharge and was seen in his office on  September 06, 2007.  At that time, the patient's atrial fibrillation had  an uncontrolled ventricular response and he adjusted her medications to  include atenolol 100 mg a day, Nadolol 80 mg a day, Inderal LA 160 mg to  take as an alternative to the Toprol which he discontinued at which time  she was taking 50 mg twice a day.  The patient apparently had not  started taking some of the medications, as she was weaning from the  others prior to starting through her pharmacy.  The patient had been  complaining of PND, orthopnea and dyspnea on exertion and shortness of  breath at rest in a upright position.  In fact, when she went to church  yesterday, she could hardly walk secondary to the shortness of  breath  and she did notice her heart rate was rapid, but she states that her  heart rate has been rapid ever since being discharged from the hospital.   REVIEW OF SYSTEMS:  Positive for shortness of breath, dyspnea on  exertion, orthopnea, PND, presyncope and rapid heart rate.  She denies  chest pain, she denies diaphoresis, nausea, vomiting, hemoptysis or  melena.   PAST MEDICAL HISTORY:  1. Severe bradycardia in the setting of permanent atrial fibrillation      with tachybrady syndrome status post single-chamber Medtronic      pacemaker placement October of 2008.  2. History of brain aneurysm.  3. Hypertension.  4. Thoracic aneurysm.  5. AAA status post resection.  6. Aortic valve replacement with bioprosthetic valve in the [facility] prior to moving here.  7. The patient also has seizures.   PAST SURGICAL HISTORY:  1. Aortic brain aneurysm repair.  2. AAA resection.  3. Aortic valve replacement with bioprosthetic valve and also  Medtronic pacemaker placed October of 2008.   SOCIAL:  She lives in [place] with her husband and niece.  She is  retired.  She does not smoke, does not drink alcohol and does not use  illicit drugs.   FAMILY HISTORY:  Noncontributory secondary to diagnosis and age.   CURRENT LABS:  Sodium 137, potassium 4.3, chloride 106, CO2 45, BUN 11,  creatinine 1.2, glucose 98.  Hemoglobin 11.6, hematocrit 35.8, white  blood cells 4.6, platelets 231.  PT 36.9, INR 3.6.   CURRENT MEDICATIONS AT HOME:  1. Atenolol 100 mg once a day.  2. Propranolol 120 mg once a day.  3. Coumadin 5 mg everyday and 2.5 on Monday, Wednesday, Friday.  4. Advair two puffs once a day.  5. Protonix 40 mg once a day.   ALLERGIES:  TO IODINE CAUSING ANAPHYLAXIS ALSO SULFA AND LASIX CAUSING  SEVERE RASH.   PHYSICAL EXAMINATION:  VITAL SIGNS:  Blood pressure 147/91, heart rate  97 to 120, respirations 16, temperature 97.3.  HEENT:  Head is normocephalic,  atraumatic.  The patient wears glasses.  Eyes:  PERRLA.  Mucous membranes and mouth pink and moist.  Tongue is  midline.  NECK:  Supple.  There is positive JVD to the mandible.  Negative for  carotid bruits appreciated.  No thyromegaly is noted.  CARDIOVASCULAR:  Tachycardic with a small murmur auscultated.  Irregular  rhythm.  Pulses are 2+ and equal bilaterally in radial pulses and  dorsalis pedis pulses.  LUNGS:  Have diminished breath sounds bibasilar.  ABDOMEN:  Soft, nontender.  No rebound or guarding.  2+ bowel sounds.  EXTREMITIES:  There is some edema noted bilateral in the lower  extremities.  The right leg is wrapped in an Unna boot secondary to a  nonhealing wound after having had skin cancer removed and is being  followed by a wound clinic.  The left leg does have some 1+ pitting  edema with some skin discoloration noted.  NEURO:  Cranial nerves II-XII are grossly intact.   Of note, chest x-ray reveals bilateral pleural effusions.   EKG reveals sinus tachycardia, right bundle-branch block, irregular  heart rhythm with a ventricular rate of 97 beats per minute, atrial  fibrillation.   IMPRESSION:  1. Acute-on-chronic diastolic congestive heart failure.  2. Atrial fibrillation not rate controlled.  3. History of hypertension.  4. Status post single-chamber Medtronic pacemaker secondary to      tachybrady syndrome and asystole on recent hospital admission.  5. History of aortic valve replacement complicated by seizures; it is      a bioprosthetic aortic valve and she is currently on Coumadin.   PLAN:  The patient has been seen and examined by myself and Dr. Olga Millers in the emergency room.  The patient will be admitted.  The  patient clearly needs rate control which is associated with her  shortness of breath.  The patient will be given verapamil 120 mg one  p.o. once a day and also atenolol 100 mg p.o. once a day.  We will also  diurese her with ethacrynic acid  50 mg IV daily with p.o. dose to be  taken at home once she is euvolemic.  The patient will be restarted on  her Coumadin with her elevated INR; we will hold it today and follow up  in the morning with PT/INR and adjustment per pharmacy.  The patient  will be continued on other medications to include Advair Diskus,  Protonix  and she will have potassium replacement.  We will follow the  patient with daily weights and strict I & O and also on telemetry to  evaluate heart rate response and medications.  The patient has been  explained this and verbalizes understanding and is willing to be  admitted.  We will make further recommendations based upon hospital  course.      [name] Mare. Lyman Bishop, NP      Madolyn Frieze. Jens Som, MD, [facility]  Electronically Signed    KML/MEDQ  D:  09/13/2007  T:  09/13/2007  Job:  [id number]

## 2011-04-01 NOTE — Consult Note (Signed)
NAMEKAMA, CAMMARANO              ACCOUNT NO.:  [id number]   MEDICAL RECORD NO.:  [id number]          PATIENT TYPE:  REC   LOCATION:  FOOT                         FACILITY:  [facility]   PHYSICIAN:  Jonelle Sports. Sevier, M.D. DATE OF BIRTH:  1922-09-23   DATE OF CONSULTATION:  08/10/2007  DATE OF DISCHARGE:                                 CONSULTATION   HISTORY OF PRESENT ILLNESS:  This 77 year old white female is being  followed for persistent ulceration on the right lower extremity  following excision of squamous cell carcinoma there a number of weeks  ago.  She has been treated with topical measures and with a compressive  wrap to that extremity and has had gradual reduction in the size of the  ulcer.  It has not, however, resolved.  In continues to have  considerable slough in the wound base.   She reports some pain in the area but considerably less as time goes on.  There has been no itching, no redness, no increase in drainage of which  she is aware.  She has had no odor, no fever or chills.  Blood pressure  is 143/77, pulse is 104 and regular, respirations 16, temperature 97.6.  The ulcer on the mid pretibial aspect just medial to the side of the  tibia it is measuring now 1.4 x 0.6 x 0.2 cm and does have some  fibrinous and slightly bloody slough in its base.  There is no evidence  of deep or surrounding infection.  There is some encrustation of the  margins of the wound.   IMPRESSION:  Gradual improvement in wound right lower extremity.   DISPOSITION:  Following application of EMLA cream for her comfort, the  wound is selectively debrided of the crusts at the margin and of the  segmental fibrinous slough in the wound base.  The base is then rather  clean.   The wound is then treated with an application of prisma dressing over  hydrogel.  This is covered with a nonstick pad to keep the prisma  hopefully from slipping.   The wound is then returned to a Profore light wrap.   Follow-up visit will be here in 10 days.           ______________________________  Jonelle Sports Cheryll Cockayne, M.D.     RES/MEDQ  D:  08/10/2007  T:  08/11/2007  Job:  [id number]

## 2011-04-01 NOTE — Assessment & Plan Note (Signed)
[name] OFFICE NOTE   NAME:WICKERArabela, [name]                       MRN:          [id number]  DATE:01/31/2008                            DOB:          1922/03/08    Mrs. [name] comes in today as an add-on complaining of a spell that  occurred last night while at home. There was the sensation of a curtain  descending over both eyes. Lightheadedness ensued and then as it passed  she was aware that she was unable to see to her left. She does not  recall what happened when she turned her head. The symptoms lasted about  15-30 seconds as best as she can recall and then abated.   She also describes having some imbalance and walking towards the right.  This has not been significantly different, however, from before last  night's episode.   She has a history of atrial fibrillation. She is on Coumadin, but her  INR today was 1.4. Her other medications include atenolol 100/50,  Zofran, potassium, lorazepam, pantoprazole, and Coumadin. On  examination, her blood pressure was 143/77; her pulse was 80. Her lungs  were clear. Her heart sounds were irregular. The extremities had no  significant edema. Her neurological exam was notable for full  extraocular motor function, symmetric smile, no problems with facial  muscular strength and no evidence of diplopia.   IMPRESSIONS:  1. Transient neurological episode last night with loss of left visual      field.  2. History of atrial fibrillation with a subtherapeutic INR.  3. Status post pacemaker implantation for bradycardia.  4. History of ascending aortic aneurysm repair with an aortic valve      replacement.  5. Hypertension.  6. History of an aortic brain aneurysm repair (?).  7. CONTRAST DYE ALLERGY.   I took the privilege of calling Dr. Lesia [name] asking the question  whether in the event that nothing was seen on CT scanning would  heparinization be indicated, and as  I suspected the answer is no from  which I inferred the issues of transformation remain paramount at this  point. In any case, we did decide to get a CT scan of her head, the  preliminary report of which has just come back as demonstrating no  significant hemorrhage. Her Coumadin level will be up-titrated, and we  will also plan to do carotid arterial Dopplers looking to see if there  is any evidence of a more proximal source. Because of her contrast dye  allergy at this point we will not pursue CT angiography.     [name] Salvia, MD, [facility]  Electronically Signed   SCK/MedQ  DD: 01/31/2008  DT: 01/31/2008  Job #: [id number]   cc:   [name] Main, MD

## 2011-04-01 NOTE — Assessment & Plan Note (Signed)
[facility]                         ELECTROPHYSIOLOGY OFFICE NOTE   NAME:[name], [name]                       MRN:          [id number]  DATE:09/06/2007                            DOB:          01-06-1922    [name] is seen following pacemaker implantation for severe  bradycardia in the setting of permanent atrial fibrillation.  She  continues to have significant problems of shortness of breath and  palpitations.  Her medications are reviewed.  She is on Toprol 50 mg  b.i.d., which is a significant up-titration.  Her digoxin has been  stopped following the amiodarone, which was initiated initially in  [place] and has been discontinued.   Heart rate histograms for the last 2 weeks are essentially identical  with a bimodal distribution.   Her medications include the Toprol 50 mg b.i.d., Advair and Protonix and  warfarin.   Her blood pressure today was 143/96, her pulse was 45 sort of but is  actually because of irregularities.  Interrogation of her pacemaker  demonstrated normal function with a bimodal distribution with about 50%  of her heart rates faster than 100 beats per minute.   IMPRESSION:  1. Atrial fibrillation with an uncontrolled ventricular response.  2. Bradycardia, status post pacemaker implantation.  3. Status post atrial valve replacement and surgical maze at [facility].   I have given her prescriptions today for verapamil 120 mg to add to her  beta blockers.  I have also given her prescriptions for atenolol 100 mg,  nadolol 80 mg and Inderal LA 160 mg to take as an alternative to the  Toprol.  We will plan to see her again in about 4-5 weeks' time to see  how it is that she is doing.  She is to call us if she has more  symptoms.     Duke Salvia, MD, [facility]  Electronically Signed    SCK/MedQ  DD: 09/06/2007  DT: 09/07/2007  Job #: [id number]

## 2011-04-01 NOTE — Cardiovascular Report (Signed)
[name], GWINN NO.:  [id number]   MEDICAL RECORD NO.:  [id number]          PATIENT TYPE:  INP   LOCATION:  2908                         FACILITY:  [facility]   PHYSICIAN:  [name]. [name], MD  DATE OF BIRTH:  Jul 22, 1922   DATE OF PROCEDURE:  DATE OF DISCHARGE:                            CARDIAC CATHETERIZATION   PROCEDURE:  Temporary transvenous pacemaker placement.   INDICATIONS:  Ms. Osmun is a delightful, 77 year old woman who  presented to the hospital with syncope.  She has been monitored in the  CCU.  She has had A fib and marked bradycardia.  She has longstanding  atrial fibrillation as well as a bioprosthetic aortic valve.  She had a  long period of asystole today requiring transient CPR and transcutaneous  pacing.  She has a high INR from anticoagulation on Coumadin.  Initially  her INR was 8, it has now come down to 3.5.  We elected to place a  transvenous pacemaker for stabilization until a permanent pacemaker can  be safely placed in the setting of her anticoagulation.   The risks and indications of the procedure were reviewed with the  patient and her husband.  Informed consent was obtained.  The right  groin was prepped, draped, anesthetized with 1% lidocaine. Using the  modified Seldinger technique, a 6-French sheath was placed in the right  femoral vein.  A balloon-tipped temporary pacing catheter was inserted  through the 6-French sheath and placed in the RV apex.  This was done  without difficulty. The pacing rate was set at 80 beats per minute and  the threshold was 0.6 mA. The output was set at 10 mA and the patient  transferred back to the CCU in guarded but stable condition.  There were  no immediate complications.   CONCLUSION:  Successful transvenous pacemaker placement under  fluoroscopic guidance.      [name]. [name], MD  Electronically Signed     MDC/MEDQ  D:  08/19/2007  T:  08/20/2007  Job:  [id number]

## 2011-04-01 NOTE — Assessment & Plan Note (Signed)
[facility] OFFICE NOTE   NAME:WICKERElayna, [name]                       MRN:          [id number]  DATE:10/18/2007                            DOB:          09/19/1922    [name] comes in today as an add-on because of persistent problems  with dizziness, nausea, and vomiting, and an inability to keep much down  over the last 3 to 4 days.   She saw me initially on September 27, 2007.  Because of the constipation,  I switched her verapamil to Diltiazem.   She came back to the office on October 08, 2007 to see Dr. Jens Som.  Because of vertigo, he started Antivert.  He also obtained a non-  contrast head CT, which showed no obvious bleed.  Please see her report  for this.   Of note, she was not orthostatic on exam that day.  She had no focal  neurological signs.   She called Dr. Tenny Craw, who was on call this past Friday.  After speaking  to the patient, Dr. Tenny Craw recommended that she restart the verapamil and  stop the Diltiazem.   She denies any fevers, chills, sweats.  She has had no chest pain.  No  shortness of breath.  No orthopnea, PND, peripheral edema.   She has been on the verapamil now for 2 days but could not keep it down  1 day because of some vomiting.   MEDICATIONS:  1. Coumadin as directed.  2. Potassium 20 mEq a day.  3. Pantoprazole 40 mg a day.  4. Edecrin 50 mg a day.  5. Lorazepam 0.5 nightly.  6. Atenolol 100 mg a day.  7. Verapamil Extended Release 120 mg a day.   PHYSICAL EXAM:  She is in no acute distress.  SKIN:  Warm and dry.  Blood pressure is 106/58, which is lower for her usual pressures.  Her  pulse is 64 and regular.  Her weight is 131, which is down from 138.  We  saw her on September 27, 2007.  HEENT:  Normocephalic, atraumatic.  PERRLA.  Extraocular muscles are  intact.  Sclerae clear.  Facial symmetry is normal.  Carotid upstrokes  are equal bilaterally.  There is no  obvious JVD.  Thyroid is not  enlarged.  LUNGS:  Clear.  HEART:  Reveals a regular rate and rhythm.  ABDOMEN:  Remarkable for good bowel sounds.  It is not tense.  She has  some diffuse soreness throughout.  There is no discrete area of  tenderness.  EXTREMITIES:  Reveal no cyanosis, clubbing, or edema.  Pulses are  intact, but reduced.  NEURO:  Exam is intact.   EKG shows ventricular pacing with underlying atrial fibrillation.  Her  rate is 65 beats per minute.   ASSESSMENT:  Persistent nausea, vomiting, and diarrhea, probable viral  gastroenteritis.   She seems to be somewhat volume depleted.  I have asked her to hold her  edecrin.  We put a call in to her primary care physician at [facility], Dr. Ellsworth Lennox.  I  have advised her to go to their office for  further evaluation.     Thomas C. Daleen Squibb, MD, [facility]  Electronically Signed    TCW/MedQ  DD: 10/18/2007  DT: 10/18/2007  Job #: [id number]   cc:   Dr. Ellsworth Lennox

## 2011-04-01 NOTE — H&P (Signed)
[name], BRUENING NO.:  [id number]   MEDICAL RECORD NO.:  [id number]          PATIENT TYPE:  INP   LOCATION:  2908                         FACILITY:  [facility]   PHYSICIAN:  Altha Harm, MDDATE OF BIRTH:  01-Oct-1922   DATE OF ADMISSION:  08/18/2007  DATE OF DISCHARGE:                              HISTORY & PHYSICAL   CHIEF COMPLAINT:  Syncope.   HISTORY OF PRESENT ILLNESS:  This is an 77 year old lady with a history  of aortic aneurysm, aortic valve replacement, atrial fibrillation who  was recently started on amiodarone by a cardiologist in [place].  Today, the patient had two witnessed episodes of passing out, each  lasting for about one-half minute.  The patient states that she had  dizziness preceding the episode but had no palpitations.  She also  complained of chest pain, starting today, that has been located in the  left inframammary area, is sharp in nature and continues.  There is no  variation of the chest pain with any activity.   The patient denies any fever or chills.  She denies any nausea, vomiting  or diarrhea.   PAST MEDICAL HISTORY:  Significant for hypertension, atrial  fibrillation, a cyst on the kidney, abdominal aortic aneurysm status  post resection, thoracic aneurysm, aortic valve replacement.  The  patient also had a seizure at the time of her surgery but, since then,  has had no seizure activity.   FAMILY HISTORY:  Positive but not significant in a patient of this age.   SOCIAL HISTORY:  The patient resided in [place], [place], for most  of her adult life.  However, most recently, she and her husband have  moved to the [place] area to reside with her niece.  The patient has  established here with a physician in [place] but has not established  care with any cardiologist.  The patient denies any tobacco or alcohol  or drug use.   CURRENT MEDICATIONS:  Include the following:  1. Amiodarone 300 mg which was  started last week.  2. Coumadin 6 mg daily.  3. Ativan 0.5 mg daily.  4. Albuterol MDI p.r.n.  5. Please not that the patient had been on Altace and metoprolol, both      of which were stopped in the last few days by the cardiologist that      she saw on one occasion in [place].   ALLERGIES:  PENICILLIN, IODINE AND SULFA.   REVIEW OF SYSTEMS:  Fourteen systems were reviewed.  All systems are  negative except as noted in the HPI.   PHYSICAL EXAMINATION:  GENERAL:  This is a well-appearing patient, lying  in the bed, who appears in no acute distress.  The patient has been  obtaining an MRI of the abdomen and thorax and has just returned.  Her  family, including her husband and two nieces, are at the bedside.  HEENT EXAMINATION:  She is normocephalic and atraumatic.  Pupils are  equally round and reactive to light and accommodation.  Extraocular  movements are intact.  Oropharynx is moist.  No exudate, erythema  or  lesions are noted.  NECK EXAMINATION:  Trachea is midline.  No masses, no thyromegaly, no  JVD, no carotid bruit.  RESPIRATORY EXAMINATION:  The patient has a normal respiratory effort.  Equal excursion bilaterally.  No wheezes, rales or rhonchi noted on  examination.  CARDIOVASCULAR:  The patient has a significant bradycardia.  She has no  murmurs, rubs or gallops.  S1 and S2 appear to be intact.  There are no  heaves or thrills on palpation.  ABDOMINAL EXAMINATION:  The patient has normoactive bowel sounds.  Abdomen is soft, nontender, nondistended.  No masses, no  hepatosplenomegaly noted.  LYMPH NODE:  She has no cervical, axillary, or inguinal lymphadenopathy.  MUSCULOSKELETAL:  The patient has no warmth, swelling or erythema around  the joints.  She has no spinal tenderness.  NEUROLOGICAL:  The patient has no focal neurological deficits.  Cranial  nerves II-XII are grossly intact.  Sensations to touch and left touch  proprioception.  She is able to move all  extremities against gravity and  refuses formal strength testing at this time.  SKIN:  The patient is being treated at the wound center for a skin  cancer of her right lower extremity and, presently, she has an Radio broadcast assistant  applied to the right lower extremity.  PSYCHIATRIC:  The patient is alert and oriented x3.  She has good  insight and cognition and good recent to remote recall.   ASSESSMENT AND PLAN:  This patient presents with:  1. Bradycardia which may represent a sick sinus syndrome and likely      cause for her syncope .  We will ask [facility] to see her      and they have already been consulted and will see her in the a.m.      I will also get orthostatics on the patient and, at this time, I      will give the patient 0.5 of atropine secondary to second-degree      heart block.  2. First degree heart block, likely secondary to the amiodarone.  The      amiodarone will be discontinued at this time.  3. Warfarin-induced coagulopathy.  Likely this is secondary to the      interaction between amiodarone and Coumadin and the Coumadin will      be held at this time.  Pt has a history of hemorrhagic renal cyst      and in light of this, her INR will be reversed with Vitamin K and      FFP. She will have daily PT/INRs.  The goal of Coumadin therapy is      2 to 3.  4. History of aneurysm.  The patient appears to be stable from this      standpoint.  5. Elevated D-dimer.  It is unlikely that the patient would have a PE      with INR of 8.  Will cancel V/Q scan ordered and observe patient.  The patient will be observed in the Step-down unit at this time and  further decisions will be made based upon the patient's response to  treatment in the first 24 hours.      Altha Harm, MD  Electronically Signed     MAM/MEDQ  D:  08/18/2007  T:  08/19/2007  Job:  [id number]

## 2011-04-09 ENCOUNTER — Ambulatory Visit (INDEPENDENT_AMBULATORY_CARE_PROVIDER_SITE_OTHER): Payer: Medicare Other | Admitting: Emergency Medicine

## 2011-04-09 DIAGNOSIS — I359 Nonrheumatic aortic valve disorder, unspecified: Secondary | ICD-10-CM

## 2011-04-09 DIAGNOSIS — G459 Transient cerebral ischemic attack, unspecified: Secondary | ICD-10-CM

## 2011-04-09 DIAGNOSIS — I4891 Unspecified atrial fibrillation: Secondary | ICD-10-CM

## 2011-04-16 ENCOUNTER — Emergency Department (HOSPITAL_COMMUNITY): Payer: Medicare Other

## 2011-04-16 ENCOUNTER — Inpatient Hospital Stay (HOSPITAL_COMMUNITY)
Admission: EM | Admit: 2011-04-16 | Discharge: 2011-04-18 | DRG: 293 | Disposition: A | Discharge status: Home Health | Payer: Medicare Other | Attending: Internal Medicine | Admitting: Internal Medicine

## 2011-04-16 DIAGNOSIS — Z880 Allergy status to penicillin: Secondary | ICD-10-CM

## 2011-04-16 DIAGNOSIS — Z91040 Latex allergy status: Secondary | ICD-10-CM

## 2011-04-16 DIAGNOSIS — Z952 Presence of prosthetic heart valve: Secondary | ICD-10-CM

## 2011-04-16 DIAGNOSIS — Z79899 Other long term (current) drug therapy: Secondary | ICD-10-CM

## 2011-04-16 DIAGNOSIS — Z888 Allergy status to other drugs, medicaments and biological substances status: Secondary | ICD-10-CM

## 2011-04-16 DIAGNOSIS — I509 Heart failure, unspecified: Secondary | ICD-10-CM | POA: Diagnosis present

## 2011-04-16 DIAGNOSIS — I4891 Unspecified atrial fibrillation: Secondary | ICD-10-CM | POA: Diagnosis present

## 2011-04-16 DIAGNOSIS — Z950 Presence of cardiac pacemaker: Secondary | ICD-10-CM

## 2011-04-16 DIAGNOSIS — Z882 Allergy status to sulfonamides status: Secondary | ICD-10-CM

## 2011-04-16 DIAGNOSIS — I10 Essential (primary) hypertension: Secondary | ICD-10-CM | POA: Diagnosis present

## 2011-04-16 DIAGNOSIS — D696 Thrombocytopenia, unspecified: Secondary | ICD-10-CM | POA: Diagnosis present

## 2011-04-16 DIAGNOSIS — I5033 Acute on chronic diastolic (congestive) heart failure: Principal | ICD-10-CM | POA: Diagnosis present

## 2011-04-16 DIAGNOSIS — Z7901 Long term (current) use of anticoagulants: Secondary | ICD-10-CM

## 2011-04-16 DIAGNOSIS — K219 Gastro-esophageal reflux disease without esophagitis: Secondary | ICD-10-CM | POA: Diagnosis present

## 2011-04-16 LAB — POCT I-STAT, CHEM 8
BUN: 17 mg/dL (ref 6–23)
Calcium, Ion: 1.1 mmol/L — ABNORMAL LOW (ref 1.12–1.32)
Creatinine, Ser: 0.7 mg/dL (ref 0.4–1.2)
Glucose, Bld: 136 mg/dL — ABNORMAL HIGH (ref 70–99)
Hemoglobin: 15 g/dL (ref 12.0–15.0)
Sodium: 137 mEq/L (ref 135–145)
TCO2: 29 mmol/L (ref 0–100)

## 2011-04-16 LAB — CK TOTAL AND CKMB (NOT AT ARMC)
Relative Index: INVALID (ref 0.0–2.5)
Total CK: 56 U/L (ref 7–177)

## 2011-04-16 LAB — DIGOXIN LEVEL: Digoxin Level: 0.6 ng/mL — ABNORMAL LOW (ref 0.8–2.0)

## 2011-04-16 LAB — CBC
HCT: 42.9 % (ref 36.0–46.0)
MCH: 28.5 pg (ref 26.0–34.0)
MCV: 87.4 fL (ref 78.0–100.0)
Platelets: 139 10*3/uL — ABNORMAL LOW (ref 150–400)
RDW: 13.9 % (ref 11.5–15.5)

## 2011-04-16 LAB — DIFFERENTIAL
Eosinophils Absolute: 0.2 10*3/uL (ref 0.0–0.7)
Eosinophils Relative: 2 % (ref 0–5)
Lymphocytes Relative: 6 % — ABNORMAL LOW (ref 12–46)
Lymphs Abs: 0.8 10*3/uL (ref 0.7–4.0)
Monocytes Absolute: 1 10*3/uL (ref 0.1–1.0)
Monocytes Relative: 8 % (ref 3–12)

## 2011-04-17 LAB — URINALYSIS, ROUTINE W REFLEX MICROSCOPIC
Glucose, UA: NEGATIVE mg/dL
Specific Gravity, Urine: 1.007 (ref 1.005–1.030)
pH: 7 (ref 5.0–8.0)

## 2011-04-18 ENCOUNTER — Inpatient Hospital Stay (HOSPITAL_COMMUNITY): Payer: Medicare Other

## 2011-04-18 LAB — PROTIME-INR
INR: 2.31 — ABNORMAL HIGH (ref 0.00–1.49)
Prothrombin Time: 25.5 seconds — ABNORMAL HIGH (ref 11.6–15.2)

## 2011-04-18 LAB — URINE CULTURE

## 2011-04-18 LAB — PRO B NATRIURETIC PEPTIDE: Pro B Natriuretic peptide (BNP): 1174 pg/mL — ABNORMAL HIGH (ref 0–450)

## 2011-04-24 ENCOUNTER — Encounter: Payer: Self-pay | Admitting: Cardiovascular Disease

## 2011-04-29 ENCOUNTER — Ambulatory Visit (INDEPENDENT_AMBULATORY_CARE_PROVIDER_SITE_OTHER): Payer: Medicare Other | Admitting: Cardiovascular Disease

## 2011-04-29 ENCOUNTER — Encounter: Payer: Self-pay | Admitting: Cardiovascular Disease

## 2011-04-29 DIAGNOSIS — I509 Heart failure, unspecified: Secondary | ICD-10-CM

## 2011-04-29 DIAGNOSIS — Z950 Presence of cardiac pacemaker: Secondary | ICD-10-CM

## 2011-04-29 DIAGNOSIS — I4891 Unspecified atrial fibrillation: Secondary | ICD-10-CM

## 2011-04-29 DIAGNOSIS — G459 Transient cerebral ischemic attack, unspecified: Secondary | ICD-10-CM

## 2011-04-29 DIAGNOSIS — I5032 Chronic diastolic (congestive) heart failure: Secondary | ICD-10-CM

## 2011-04-29 DIAGNOSIS — I359 Nonrheumatic aortic valve disorder, unspecified: Secondary | ICD-10-CM

## 2011-04-29 DIAGNOSIS — E785 Hyperlipidemia, unspecified: Secondary | ICD-10-CM

## 2011-04-29 DIAGNOSIS — I6529 Occlusion and stenosis of unspecified carotid artery: Secondary | ICD-10-CM

## 2011-04-29 DIAGNOSIS — I739 Peripheral vascular disease, unspecified: Secondary | ICD-10-CM

## 2011-04-29 DIAGNOSIS — I714 Abdominal aortic aneurysm, without rupture: Secondary | ICD-10-CM

## 2011-04-29 MED ORDER — ATORVASTATIN CALCIUM 10 MG PO TABS: 10.0000 mg | ORAL_TABLET | Freq: Every day | ORAL | Status: DC

## 2011-04-29 NOTE — Assessment & Plan Note (Signed)
Recent admission to the hospital for shortness of breath requiring gentle diuresis with improvement. Her weight has been averaging in the high 130s to low 140s. I suggested she try to keep her weight approximately at this level. She can take An extra Lasix if her weight increases to more than 143 pounds.

## 2011-04-29 NOTE — Patient Instructions (Signed)
You are doing well. Start lipitor 10 mg daily Please call us if you have new issues that need to be addressed before your next appt.  We will call you for a follow up Appt. In 6 months

## 2011-04-29 NOTE — Assessment & Plan Note (Signed)
Aggressive medical management of her cholesterol as detailed above. Goal LDL less than 70.

## 2011-04-29 NOTE — Assessment & Plan Note (Signed)
She does have a history of aortic valve Replacement with bioprosthetic valve, gradient in February of this year is within normal limits. Echocardiogram next year.

## 2011-04-29 NOTE — Assessment & Plan Note (Signed)
Heart rate is recently well controlled on her current medication regimen. No further changes to her medications

## 2011-04-29 NOTE — Progress Notes (Signed)
Patient ID: [name], female    DOB: 1922-08-03, 77 y.o.   MRN: [id number]  HPI Comments: 77 yo WF with history of tachy-brady syndrome s/p pacemaker, Permanent atrial fibrillation, HTN, AVR, AAA with repair 4-5 years ago, and chronic diastolic heart failure,  right brachial arterial stenosis, With recent TIA.She presents for routine followup.  She was recently admitted to the hospital at [facility] on May 30 with shortness of breath with lying down. There is no significant lower extremity edema. Discharge paperwork suggests acute on chronic diastolic heart failure, underlying hypertension. Chest x-ray did suggest vascular congestion, possible underlying pneumonia. White blood cell count was 13. Antibiotics were discontinued after her chest x-ray cleared. In followup today, she reports no significant shortness of breath when she lies flat.  Echocardiogram in February 2012 showed ejection fraction 50-55%, pulmonary pressures 46 mm of mercury otherwise normal study.   She has had recent diagnosis of skin cancer on her legs and left leg he is currently in a dressing after recent surgery. She has had surgery also in the right leg   cardiac catheterization over 5 years ago showing no significant coronary artery disease,   recent echo showing mild aortic stenosis with intact aortic valve, bioprosthetic valve, mild pulmonary hypertension   recent CT scan showing stable descending aorta and after repair, minimal dilatation of the descending aorta, stable-appearing aortic valve.   EKG shows atrial fibrillation With a rate of 74 beats per minute, right bundle branch block, left anterior fascicular block.          Review of Systems  HENT: Negative.   Eyes: Negative.   Respiratory: Positive for shortness of breath.   Cardiovascular: Negative.   Gastrointestinal: Negative.   Musculoskeletal: Positive for gait problem.  Skin: Negative.   Neurological: Positive for weakness.  Hematological:  Negative.   Psychiatric/Behavioral: Negative.     BP 138/75  Pulse 70  Ht 5\' 3"  (1.6 m)  Wt 145 lb (65.772 kg)  BMI 25.69 kg/m2   Physical Exam  Nursing note and vitals reviewed. Constitutional: She is oriented to person, place, and time. She appears well-developed and well-nourished.  HENT:  Head: Normocephalic.  Nose: Nose normal.  Mouth/Throat: Oropharynx is clear and moist.  Eyes: Conjunctivae are normal. Pupils are equal, round, and reactive to light.  Neck: Normal range of motion. Neck supple. No JVD present.  Cardiovascular: Normal rate, S1 normal, S2 normal and intact distal pulses.  An irregularly irregular rhythm present. Exam reveals no gallop and no friction rub.   Murmur heard.  Crescendo systolic murmur is present with a grade of 1/6  Pulmonary/Chest: Effort normal and breath sounds normal. No respiratory distress. She has no wheezes. She has no rales. She exhibits no tenderness.  Abdominal: Soft. Bowel sounds are normal. She exhibits no distension. There is no tenderness.  Musculoskeletal: Normal range of motion. She exhibits no edema and no tenderness.  Lymphadenopathy:    She has no cervical adenopathy.  Neurological: She is alert and oriented to person, place, and time. Coordination normal.  Skin: Skin is warm and dry. No rash noted. No erythema.       Left lower extremity in a bandage. Right leg with signs of chronic venous insufficiency, skin discoloration as well as signs of chronic sun exposure and various lesions.  Psychiatric: She has a normal mood and affect. Her behavior is normal. Judgment and thought content normal.         Assessment and Plan

## 2011-04-29 NOTE — Assessment & Plan Note (Signed)
She has abdominal aortic aneurysm repair, right brachial artery occlusion. We will start her on Lipitor 10 mg daily. Goal LDL is less than 70. Currently it is 118.

## 2011-05-07 ENCOUNTER — Ambulatory Visit (INDEPENDENT_AMBULATORY_CARE_PROVIDER_SITE_OTHER): Payer: Medicare Other | Admitting: Emergency Medicine

## 2011-05-07 ENCOUNTER — Encounter: Payer: Medicare Other | Admitting: Emergency Medicine

## 2011-05-07 DIAGNOSIS — I4891 Unspecified atrial fibrillation: Secondary | ICD-10-CM

## 2011-05-07 DIAGNOSIS — G459 Transient cerebral ischemic attack, unspecified: Secondary | ICD-10-CM

## 2011-05-07 DIAGNOSIS — I359 Nonrheumatic aortic valve disorder, unspecified: Secondary | ICD-10-CM

## 2011-05-07 LAB — POCT INR: INR: 2.6

## 2011-05-13 ENCOUNTER — Ambulatory Visit (INDEPENDENT_AMBULATORY_CARE_PROVIDER_SITE_OTHER): Payer: Medicare Other | Admitting: Internal Medicine

## 2011-05-13 ENCOUNTER — Encounter: Payer: Self-pay | Admitting: Internal Medicine

## 2011-05-13 DIAGNOSIS — G459 Transient cerebral ischemic attack, unspecified: Secondary | ICD-10-CM

## 2011-05-13 DIAGNOSIS — Z950 Presence of cardiac pacemaker: Secondary | ICD-10-CM

## 2011-05-13 DIAGNOSIS — I509 Heart failure, unspecified: Secondary | ICD-10-CM

## 2011-05-13 DIAGNOSIS — I495 Sick sinus syndrome: Secondary | ICD-10-CM

## 2011-05-13 DIAGNOSIS — I5032 Chronic diastolic (congestive) heart failure: Secondary | ICD-10-CM

## 2011-05-13 DIAGNOSIS — I4891 Unspecified atrial fibrillation: Secondary | ICD-10-CM

## 2011-05-13 MED ORDER — TORSEMIDE 10 MG PO TABS: 10.0000 mg | ORAL_TABLET | Freq: Every day | ORAL | Status: DC | PRN

## 2011-05-13 MED ORDER — TRIAMTERENE-HCTZ 37.5-25 MG PO TABS: 0.5000 | ORAL_TABLET | Freq: Every day | ORAL | Status: DC

## 2011-05-13 MED ORDER — TRIAMTERENE-HCTZ 37.5-25 MG PO TABS: 1.0000 | ORAL_TABLET | Freq: Every day | ORAL | Status: DC

## 2011-05-13 NOTE — Assessment & Plan Note (Signed)
Permanent, we walked in the office and peak HR 87

## 2011-05-13 NOTE — Assessment & Plan Note (Signed)
Continue target 2.5 -3.0

## 2011-05-13 NOTE — Patient Instructions (Addendum)
Your physician has recommended you make the following change in your medication: DECREASE Torsemide to 10mg  ONLY AS NEEDED for wt >140 lb INCREASE triamterene-hctz to 1 whole tablet daily.  Your physician recommends that you schedule a follow-up appointment in: 6 months

## 2011-05-13 NOTE — Assessment & Plan Note (Signed)
The patient's device was interrogated.  The information was reviewed. No changes were made in the programming.    

## 2011-05-13 NOTE — Progress Notes (Signed)
HPI  [name] is a 77 y.o. female Seen in followup for permanent pacer implanted in the setting of permanent atrial fibrillation and bradycardia.  She has prior TIA with her Burnard Leigh  Now target  2.5-30  She also has a History of H TN, AVR, AAA with repair 4-5 years ago, and chronic diastolic heart failure,  Which she manages with prn toresimide added to her Maxzide.     Echocardiogram in February 2012 showed ejection fraction 50-55%, pulmonary pressures 46 mm of mercury; mild aortic stenosis with intact aortic valve, bioprosthetic valve, otherwise normal study.      cardiac catheterization over 5 years ago showing no significant coronary artery disease,   recent CT scan showing stable descending aorta and after repair, minimal dilatation of the descending aorta, stable-appearing aortic valve.   .  She cntninues to struggle with healing from leg wounds related to venous surgery and subsequent diagnosis of skin cancer     Past Medical History  Diagnosis Date  . Bradycardia     Severe in the setting of permanent atrial fibrillation with tachybrady syndrome s/p single-chamber Medtronic pacemaker placement 08/2007  . Permanent atrial fibrillation   . Tachy-brady syndrome   . S/P placement of cardiac pacemaker   . Brain aneurysm     Hx of  . Hypertension   . Thoracic aneurysm   . AAA (abdominal aortic aneurysm)     S/P resection  . S/P aortic valve replacement with bioprosthetic valve     [facility] prior to moving here  . Seizures   . Diastolic heart failure   . Diverticulosis   . Hemorrhoids   . Gastritis     Past Surgical History  Procedure Date  . Cerebral aneurysm repair   . Abdominal aortic aneurysm repair     Resection  . Aortic valve replacement     With bioprosthetic valve  . Pacemaker placement 08/2007    Medtronic; AF permanent; s/p pacemaker for bradycardia with now well controlled ventricular response; Digoxin therapy, question level; Lower  extremity venous issues  . Breast biopsy   . Cholecystectomy   . Vesicovaginal fistula closure w/ tah     Current Outpatient Prescriptions  Medication Sig Dispense Refill  . atenolol (TENORMIN) 100 MG tablet Take 1 tablet (100 mg total) by mouth daily.  30 tablet  6  . atorvastatin (LIPITOR) 10 MG tablet Take 1 tablet (10 mg total) by mouth daily.  30 tablet  11  . digoxin (LANOXIN) 0.125 MG tablet Take 1 tablet (125 mcg total) by mouth daily.  30 tablet  6  . LORazepam (ATIVAN) 0.5 MG tablet Take 0.5 mg by mouth 3 (three) times daily as needed.        . Multiple Vitamins-Minerals (EYE VITAMINS) CAPS Take by mouth daily.        [name] neomycin-bacitracin-polymyxin (NEOSPORIN) ointment Apply topically as needed.        [name] omeprazole (PRILOSEC) 40 MG capsule Take 40 mg by mouth 2 (two) times daily.        [name] torsemide (DEMADEX) 20 MG tablet 20 mg. Take as needed for weight gain > 140.       . traZODone (DESYREL) 50 MG tablet Take 50 mg by mouth at bedtime.        . triamterene-hydrochlorothiazide (MAXZIDE-25) 37.5-25 MG per tablet Take 1 tablet by mouth daily.        [name] warfarin (COUMADIN) 2.5 MG tablet Take 2.5 mg by mouth.  Take as directed Saturday and Monday.       . warfarin (COUMADIN) 5 MG tablet Take by mouth as directed.          Allergies  Allergen Reactions  . Advair Hfa   . Furosemide   . Iodine   . Iohexol      Desc: patient requires a 13 hr prep, when doing exam with contrast,,,,,fever; chills; dyspnea(slg/04/27/09--given 2 hour solumedrol/ benadryl prep--no reaction)   . Penicillins   . Sulfonamide Derivatives   . Verapamil     Review of Systems negative except from HPI and PMH  Physical Exam Well developed and well nourished in no acute distress HENT normal E scleral and icterus clear Neck Supple JVP flat;  Clear to ausculation Irregularly irregular rate and rhythm, 2/6 systolic murmer Soft with active bowel sounds No clubbing cyanosis ; venous discoloration changes  with wrapping of left leg Alert and oriented, grossly normal motor and sensory function Skin Warm and Dry  ECG  Assessment and  Plan

## 2011-05-13 NOTE — Assessment & Plan Note (Signed)
Quite comfortable walking today.  Will change diuretic as prn toresimde is quite draining.  Will increase maxzide to 1 daily from 1/2 and dcreae prn dose from 20-10 mg

## 2011-05-16 LAB — PACEMAKER DEVICE OBSERVATION
BMOD-0003RV: 30
BMOD-0005RV: 95 {beats}/min
RV LEAD AMPLITUDE: 15.68 mv
RV LEAD THRESHOLD: 0.75 V

## 2011-05-27 ENCOUNTER — Other Ambulatory Visit: Payer: Self-pay | Admitting: *Deleted

## 2011-05-27 MED ORDER — WARFARIN SODIUM 5 MG PO TABS: 5.0000 mg | ORAL_TABLET | ORAL | Status: DC

## 2011-06-04 ENCOUNTER — Ambulatory Visit (INDEPENDENT_AMBULATORY_CARE_PROVIDER_SITE_OTHER): Payer: Medicare Other | Admitting: Emergency Medicine

## 2011-06-04 DIAGNOSIS — I359 Nonrheumatic aortic valve disorder, unspecified: Secondary | ICD-10-CM

## 2011-06-04 DIAGNOSIS — I4891 Unspecified atrial fibrillation: Secondary | ICD-10-CM

## 2011-06-04 DIAGNOSIS — G459 Transient cerebral ischemic attack, unspecified: Secondary | ICD-10-CM

## 2011-06-05 NOTE — Discharge Summary (Signed)
[name], [name]              ACCOUNT NO.:  [id number]  MEDICAL RECORD NO.:  [id number]           PATIENT TYPE:  I  LOCATION:  4710                         FACILITY:  [facility]  PHYSICIAN:  Calvert Cantor, M.D.     DATE OF BIRTH:  August 10, 1922  DATE OF ADMISSION:  04/16/2011 DATE OF DISCHARGE:  04/18/2011                              DISCHARGE SUMMARY   PRIMARY CARE PHYSICIAN:  Dr. Bluford Main in [place].  PRIMARY CARDIOLOGY:  Cardiologist, Dr. Mariah Milling, [facility] in [place].  PRESENTING COMPLAINT:  Shortness of breath.  DISCHARGE DIAGNOSIS:  Acute on chronic diastolic heart failure.  PAST MEDICAL HISTORY: 1. Hypertension. 2. Atrial fibrillation, on Coumadin. 3. Pacemaker. 4. Aortic valve replacement with bovine valve. 5. Abdominal aortic aneurysm, status post repair.  DISCHARGE MEDICATIONS:  New medication, torsemide 20 mg daily as needed for weight gain or shortness of breath, continue on all other meds as follows: 1. Maxzide 37.5/25 half tablet daily. 2. Atenolol 100 mg daily. 3. Digoxin 0.125 mg daily at bedtime. 4. Flonase 1 spray twice a day. 5. Lorazepam 0.5 mg three times a day. 6. Omeprazole 40 mg twice a day. 7. Trazodone 50 mg daily at bedtime. 8. Warfarin 2.5 mg Sunday, Tuesday, Thursday, Friday, and 5 mg Sunday,     Monday, Wednesday.  HOSPITAL COURSE:  This is an 77 year old female who was progressively becoming short of breath at home and eventually to a point where she was unable to lay flat without getting short of breath.  She came into the ER and was noted to have a chest x-ray that revealed vascular congestion and possible underlying pneumonia.  She did admit to having some mild cough and had a slightly elevated WBC count at 13.  At this point, she was started on treatment for both pulmonary edema and pneumonia within 2 days.  A chest x-ray was repeated and was found to be completely clear of infiltrates and it was then decided that  most likely her infiltrates were secondary to heart failure rather than pneumonia.  Her antibiotics have been discontinued.  She has had no progressive symptoms of pneumonia.  She is now able to lay flat without getting short of breath and able to walk up and down the halls without getting dyspneic, oxygen levels on room air are 96%.  I have not repeated an echo during this hospital stay but she does have known diastolic heart failure.  At this point, I will be discharging her on p.r.n. Lasix.  She is advised to weigh herself daily.  She is advised to take a low-salt diet and I did speak with Dr. Mariah Milling who will be following up with her in his office next week.  PHYSICAL EXAMINATION:  LUNGS:  Clear bilaterally. HEART:  Irregular rate and rhythm.  No murmurs. ABDOMEN:  Soft, nontender, nondistended.  Bowel sounds positive. EXTREMITIES:  No cyanosis, clubbing, or edema.  Pertinent blood work INR is 2.31 on discharge today.  CONDITION ON DISCHARGE:  Stable.  FOLLOWUP INSTRUCTIONS: 1. Dr. Mariah Milling in 1 week. 2. Dr. Ellsworth Lennox in 1-2 weeks.  Time on discharge was 45 minutes.  Calvert Cantor, M.D.     SR/MEDQ  D:  04/19/2011  T:  04/19/2011  Job:  [id number]  cc:   Dr. Mariah Milling Dr. Ellsworth Lennox  Electronically Signed by Calvert Cantor M.D. on 06/05/2011 12:05:49 PM

## 2011-06-05 NOTE — H&P (Signed)
[name], PONG              ACCOUNT NO.:  [id number]  MEDICAL RECORD NO.:  [id number]           PATIENT TYPE:  E  LOCATION:  [facility]                         FACILITY:  [facility]  PHYSICIAN:  Calvert Cantor, M.D.     DATE OF BIRTH:  [birth year]  DATE OF ADMISSION:  04/16/2011 DATE OF DISCHARGE:                             HISTORY & PHYSICAL   PRIMARY CARE PHYSICIAN:  Sheikh A. Ellsworth Lennox, MD in [place].  PRIMARY CARDIOLOGIST:  Antonieta Iba, MD with [facility].  PRESENTING COMPLAINT:  Shortness of breath.  HISTORY OF PRESENT ILLNESS:  This is a 77 year old female with a record of congestive heart failure, hypertension, abdominal aortic aneurysm, atrial fibrillation, pacemaker, and recent TIA.  The patient states that she noticed shortness of breath on exertion for about 3-4 days now, but last night through this morning she noticed shortness of breath every time she laid down.  She did not notice any chest pain or pressure.  She has not had any palpitations.  She has not noticed any swelling of her ankles.  She also admits to having increased dry cough lately.  No sore throat. She does have some sinus problems.  No earache.  No fever, chills, or sweats.  She has heard some occasional wheezing.  PAST MEDICAL HISTORY: 1. Congestive heart failure, unspecified. 2. Hypertension. 3. Abdominal aortic aneurysm.  She states one was repaired but she     still has one in the descending aorta. 4. Atrial fibrillation on Coumadin. 5. Pacemaker placement. 6. Diverticulitis. 7. Gastroesophageal reflux disease. 8. Hernia. 9. Skin cancers, recent biopsies done on her legs.  PAST SURGICAL HISTORY: 1. Abdominal aortic aneurysm repair. 2. Aortic valve replacement with a bovine valve. 3. Lung biopsy.  This was her right lung. 4. Cholecystectomy. 5. Appendectomy. 6. Hysterectomy.  SOCIAL HISTORY:  She does not smoke, drink, or use drugs.  She is currently living with her niece.   She is from [place].  FAMILY HISTORY:  Heart disease and aneurysm.  ALLERGIES:  IODINE, PENICILLIN, SULFA, LATEX, VERAPAMIL, CARDIZEM, and LASIX.  HOME MEDICATIONS:  Per med rec and confirmed with the patient. 1. Trazodone 50 mg daily at bedtime as needed.  This was recently     started. 2. Nitrofurantoin 100 mg twice a day.  She took a 10-day course for     her UTI which was completed last night. 3. Flonase nasal spray, 1 spray twice a day.  She uses this     occasionally. 4. Warfarin 2.5 mg Sunday, Tuesday, Thursday, Friday and 5 mg     Saturday, Monday, and Wednesday. 5. Omeprazole 40 mg twice a day. 6. Lorazepam 0.5 mg 1 tablet 3 times a day.  She usually takes 1 in     the morning, sometimes takes 1 in the afternoon, and usually takes     1 at night. 7. Digoxin 0.125 mg daily at bedtime. 8. Atenolol 100 mg daily. 9. Triamterene/HCTZ 37.5/25, 1/2 tablet daily.  REVIEW OF SYSTEMS:  CONSTITUTIONAL:  No recent weight loss or weight gain.  She does have occasional headaches.  No fever, chills, or sweats. HEENT:  Has had cataract surgery.  No recent visual changes.  No sore throat.  She does have some sinus trouble.  No earache.  RESPIRATORY: Positive for dry cough, shortness of breath on exertion, and occasional wheezing.  CARDIAC:  Positive for orthopnea.  No chest pain or chest pressure.  No palpitations.  No pedal edema.  GI:  Positive for reflux and constipation which has been controlled with fiber tablets.  GU: Recent UTI, currently does not feel symptomatic, meaning no dysuria. She has not noticed any hematuria either.  HEMATOLOGIC:  Bruises easily. MUSCULOSKELETAL:  Complains of arthritis in her knees, has recently been having pain across her mid lower back.  SKIN:  She had 3 biopsies done on her legs to rule out skin cancer recently.  NEUROLOGICAL:  Had a TIA in January.  She has had seizures in the past after anesthesia which was given during her valve replacement.   PSYCHOLOGICAL:  No depression or anxiety.  PHYSICAL EXAMINATION:  GENERAL:  Elderly female lying in bed in no acute distress. VITAL SIGNS:  Blood pressure was 179/90, pulse 93, respiratory rate 27, temperature 98.2, oxygen saturation 97% on O2. HEENT:  Pupils equal, round, reactive to light.  Extraocular movements are intact.  Conjunctivae are pink.  No scleral icterus.  Oral mucosa moist.  Oropharynx clear. NECK:  Supple.  No thyromegaly, lymphadenopathy, or carotid bruits. HEART:  Irregular rate and rhythm with a 2/6 murmur at the upper sternal area.  No rubs or gallops. LUNGS:  Mild wheezing.  No obvious crackles.  She has a normal respiratory effort.  No use of accessory muscles. ABDOMEN:  Soft.  Some tenderness in the left upper quadrant which she says has been going on for few weeks.  Bowel sounds positive.  Abdomen nondistended. EXTREMITIES:  No cyanosis, clubbing, or edema.  Pedal pulses are decreased. NEUROLOGICAL:  Cranial nerves II-XII intact.  Strength intact in all 4 extremities. PSYCHOLOGICAL:  Awake, alert, oriented x3.  Mood and affect normal. SKIN:  Warm, dry.  She has 3 spots on her legs where biopsies were done and these appeared to be healing well.  BLOOD WORK:  Cardiac enzymes negative x1 set.  CBC reveals a WBC count of 13.3.  Absolute granulocyte count is also elevated at 11.4, platelets are slightly low at 139.  These were slightly low in February as well at 145.  INR is 2.30.  Metabolic panel is normal except for a glucose of 136, BUN and creatinine ratio is slightly elevated, BUN is 17, creatinine 0.70.  Chest x-ray reveals increased interstitial markings with a small right pleural effusion and vascular congestion raising question for mild interstitial edema.  There is also patchy peripheral opacities in the right midlung and right lung base, mild left-sided atelectasis noted. Underlying pneumonia cannot be entirely excluded.  EKG:  Atrial  fibrillation at 96 beats per minute with right bundle- branch block and left axis deviation.  I do not see any pacer spikes.  ASSESSMENT/PLAN: 1. Dyspnea most likely related to pneumonia with a mild component of     pulmonary edema.  I did look at her past echo and she has a good EF     which was about 50-55%.  She has mild aortic stenosis and mild-to-     moderate mitral valve regurgitation.  I will give her one time dose     of Bumex as she is allergic to LASIX.  We will follow her I and Os,     daily  weights, and repeat a BNP tomorrow.  It looks like the ER has     requested a cardiology consult already. 2. For her community-acquired pneumonia, I will place her on Levaquin.     We will repeat a chest x-ray tomorrow to see if diuresing her has     caused any change.  She will be continued on oxygen to keep     saturations above 90%. 3. Hypertension.  Continue home meds. 4. Atrial fibrillation.  Continue Coumadin per pharmacy.  Rate is     controlled. 5. Pacer. 6. Aortic valve replacement. 7. Abdominal aortic aneurysm. 8. Mild thrombocytopenia.  Time on admission was 45 minutes.     Calvert Cantor, M.D.     SR/MEDQ  D:  04/16/2011  T:  04/16/2011  Job:  [id number]  cc:   [place] [facility]  Electronically Signed by Calvert Cantor M.D. on 06/05/2011 12:05:43 PM

## 2011-06-13 ENCOUNTER — Telehealth: Payer: Self-pay | Admitting: *Deleted

## 2011-06-13 NOTE — Telephone Encounter (Signed)
Pt called stating that she is on Minocycline 100mg  q 12 hrs for 7 days. She is on her third day. Pt takes coumadin. I notified pt that this abx will incr INR.  I have advised pt to take 1/2 tablet (2.5mg ) tomorrow instead of 5mg , still take her 2.5 usual dose on Sunday and to come in Monday 06/16/11 for INR check. Pt was 2.6 at her last coag visit and she is a 2.5-3.0 range.

## 2011-06-16 ENCOUNTER — Ambulatory Visit (INDEPENDENT_AMBULATORY_CARE_PROVIDER_SITE_OTHER): Payer: Medicare Other | Admitting: *Deleted

## 2011-06-16 DIAGNOSIS — I4891 Unspecified atrial fibrillation: Secondary | ICD-10-CM

## 2011-06-16 DIAGNOSIS — Z7901 Long term (current) use of anticoagulants: Secondary | ICD-10-CM

## 2011-06-16 LAB — POCT INR: INR: 1.8

## 2011-07-02 ENCOUNTER — Ambulatory Visit (INDEPENDENT_AMBULATORY_CARE_PROVIDER_SITE_OTHER): Payer: Medicare Other | Admitting: Emergency Medicine

## 2011-07-02 DIAGNOSIS — I359 Nonrheumatic aortic valve disorder, unspecified: Secondary | ICD-10-CM

## 2011-07-02 DIAGNOSIS — G459 Transient cerebral ischemic attack, unspecified: Secondary | ICD-10-CM

## 2011-07-02 DIAGNOSIS — I4891 Unspecified atrial fibrillation: Secondary | ICD-10-CM

## 2011-07-02 LAB — POCT INR: INR: 2.7

## 2011-07-04 ENCOUNTER — Encounter: Payer: Medicare Other | Admitting: Cardiothoracic Surgery

## 2011-07-04 ENCOUNTER — Encounter: Payer: Medicare Other | Admitting: Nurse Practitioner

## 2011-07-06 ENCOUNTER — Telehealth: Payer: Self-pay | Admitting: Nurse Practitioner

## 2011-07-06 NOTE — Telephone Encounter (Signed)
pts niece called this am stating that over past few days pt has had general malaise w decreased appetite and po intake.  She has been more sob than usual though her weight has been stable.  No c/p, fevers, chills.  Her bp has been running 130's-150's, while her hr has been in the 90's (h/o a. Fib).  i advised that although her hr/bp don't warrant evaluation, if she's feeling poorly then she should present to the ER for eval., which they plan on doing.

## 2011-07-07 ENCOUNTER — Ambulatory Visit (INDEPENDENT_AMBULATORY_CARE_PROVIDER_SITE_OTHER): Payer: Medicare Other | Admitting: *Deleted

## 2011-07-07 ENCOUNTER — Telehealth: Payer: Self-pay | Admitting: *Deleted

## 2011-07-07 VITALS — BP 114/67 | HR 102

## 2011-07-07 DIAGNOSIS — I4891 Unspecified atrial fibrillation: Secondary | ICD-10-CM

## 2011-07-07 MED ORDER — DILTIAZEM HCL 30 MG PO TABS: 30.0000 mg | ORAL_TABLET | Freq: Four times a day (QID) | ORAL | Status: DC

## 2011-07-07 NOTE — Progress Notes (Signed)
Pt in for EKG (see phone note 07/07/11). EKG does show a fib with RVR HR 102. Dr. Mariah Milling reviewed. Pt is taking Digoxin qd, atenolol 100mg  qd and has not missed any doses. Pt states only change recently is she had 2 placed removed from legs that were skin cancer, and she has started a Gentamicin 0.1% cream starting last Friday. Per Dr. Mariah Milling, pt will start Diltiazem 30mg  QID, and will hold her triamterene/hctz in the meantime. She will monitor BP, if SBP <100 will hold diltiazem and call office. Pt will also monitor wt since we are taking her off her fluid pill. She will call Fri with HR results and sooner if any changes occur.  Pt also c/o urinary frequency/urgency at the end of our conversation, and she does have h/o frequent UTI's. I have called pt's PCP (Tejan si) and they will have her come now for a u/a possible urine cx depending on results; even if this is unrelated to symptoms/rhythm/rate it will need to be addressed.

## 2011-07-07 NOTE — Telephone Encounter (Signed)
Pt's daughter called this AM stating that pt's BP and HR elevated all weekend, BP ran 120s-150s/70-80 and HR in 90s. Pt w/ h/o a fib. Pt states she has not missed any doses of medications. She also c/o fatigue and "feels bad," so I have advised that pt come in today for EKG to confirm rhythm, and will discuss with Dr. Mariah Milling.

## 2011-07-07 NOTE — Patient Instructions (Signed)
START Diltiazem 30mg  FOUR times a day. STOP Triamterene-HCTZ for now.   Monitor BP and HR. Call office on Friday with HR numbers, or sooner if any changes occur. If after hours, and symptoms worsen, or you are unstable call 911 or have someone take you to ER.  IF top number of BP LESS THAN 110, Hold your Diltiazem also and call the office.

## 2011-07-10 ENCOUNTER — Telehealth: Payer: Self-pay | Admitting: *Deleted

## 2011-07-10 NOTE — Telephone Encounter (Signed)
Called with report of HR now in 70s/80s. Pt still taking diltiazem 30 qid. She still cannot do much without feeling fatigued, but HR is much improved. She is still holding her triamterene/hctz, BP normal. She will f/u with Dr. Mariah Milling in 2 weeks.

## 2011-07-11 ENCOUNTER — Telehealth: Payer: Self-pay | Admitting: *Deleted

## 2011-07-11 NOTE — Telephone Encounter (Signed)
Pt's daughter called stating that pt has gained 3 lbs, her current wt is 138, on Monday it was 134. She took one torsemide 20mg . She has no signs of swelling in legs, but does some incr in SOB w/ exertion. Her HR, however, has stayed controlled in 70s. Will discuss with Dr. Mariah Milling for diuresis instructions.

## 2011-07-11 NOTE — Telephone Encounter (Signed)
Spoke to [employer], daughter, after discussing with TG. Pt advised to take Torsemide 20mg  daily as needed for incr SOB or swelling. Pt will f/u with TG in 2 weeks. She will call back next week with any changes. Kim ok with this.

## 2011-07-15 NOTE — Telephone Encounter (Signed)
Would continue to write down BP and heart rate to see if this is contributing to low energy. Will look at numbers when she comes in. Would recommend regular walking, daily

## 2011-07-16 NOTE — Telephone Encounter (Signed)
Spoke to [employer], pt's daughter, advised of rec's. She will continue to monitor and will bring in results at next visit.

## 2011-07-19 ENCOUNTER — Encounter: Payer: Medicare Other | Admitting: Cardiothoracic Surgery

## 2011-07-19 ENCOUNTER — Encounter: Payer: Medicare Other | Admitting: Nurse Practitioner

## 2011-07-22 ENCOUNTER — Encounter: Payer: Self-pay | Admitting: Cardiovascular Disease

## 2011-07-23 ENCOUNTER — Ambulatory Visit (INDEPENDENT_AMBULATORY_CARE_PROVIDER_SITE_OTHER): Payer: Medicare Other | Admitting: Emergency Medicine

## 2011-07-23 ENCOUNTER — Ambulatory Visit (INDEPENDENT_AMBULATORY_CARE_PROVIDER_SITE_OTHER): Payer: Medicare Other | Admitting: Cardiovascular Disease

## 2011-07-23 ENCOUNTER — Encounter: Payer: Self-pay | Admitting: Cardiovascular Disease

## 2011-07-23 DIAGNOSIS — I359 Nonrheumatic aortic valve disorder, unspecified: Secondary | ICD-10-CM

## 2011-07-23 DIAGNOSIS — G459 Transient cerebral ischemic attack, unspecified: Secondary | ICD-10-CM

## 2011-07-23 DIAGNOSIS — I4891 Unspecified atrial fibrillation: Secondary | ICD-10-CM

## 2011-07-23 DIAGNOSIS — E785 Hyperlipidemia, unspecified: Secondary | ICD-10-CM

## 2011-07-23 DIAGNOSIS — Z950 Presence of cardiac pacemaker: Secondary | ICD-10-CM

## 2011-07-23 DIAGNOSIS — I5032 Chronic diastolic (congestive) heart failure: Secondary | ICD-10-CM

## 2011-07-23 DIAGNOSIS — I739 Peripheral vascular disease, unspecified: Secondary | ICD-10-CM

## 2011-07-23 DIAGNOSIS — I509 Heart failure, unspecified: Secondary | ICD-10-CM

## 2011-07-23 MED ORDER — DILTIAZEM HCL 30 MG PO TABS: 30.0000 mg | ORAL_TABLET | ORAL | Status: DC | PRN

## 2011-07-23 MED ORDER — ATENOLOL 100 MG PO TABS: 100.0000 mg | ORAL_TABLET | Freq: Every day | ORAL | Status: DC

## 2011-07-23 NOTE — Progress Notes (Signed)
Patient ID: [name], female    DOB: Sep 10, 1922, 77 y.o.   MRN: [id number]  HPI Comments: 77 yo WF with history of tachy-brady syndrome s/p pacemaker, Permanent atrial fibrillation, HTN, AVR, AAA with repair 4-5 years ago, and chronic diastolic heart failure,  right brachial arterial stenosis, With recent TIA.She presents for routine followup.  She was recently admitted to the hospital at [facility] on Apr 16 2011 with shortness of breath with lying down. Discharge paperwork suggested acute on chronic diastolic heart failure, underlying hypertension. Chest x-ray did suggest vascular congestion, possible underlying pneumonia. White blood cell count was 13. Antibiotics were discontinued after her chest x-ray cleared.  Overall, she reports that she has been doing well. Diltiazem 30 mg q.i.d. Was started for rate control given previous episodes of tachycardia palpitations. She has had improved rate control though worsening lower extremity edema. She is uncertain why her heart rate climbed several months ago though her friend believes it was secondary to leg pain from the skin cancer on her legs and resection.  Echocardiogram in February 2012 showed ejection fraction 50-55%, pulmonary pressures 46 mm of mercury otherwise normal study   cardiac catheterization over 5 years ago showing no significant coronary artery disease,   recent echo showing mild aortic stenosis with intact aortic valve, bioprosthetic valve, mild pulmonary hypertension   recent CT scan showing stable descending aorta and after repair, minimal dilatation of the descending aorta, stable-appearing aortic valve.   EKG shows atrial fibrillation With a rate of 66 beats per minute, right bundle branch block, left anterior fascicular block.       Outpatient Encounter Prescriptions as of 07/23/2011  Medication Sig Dispense Refill  . atenolol (TENORMIN) 100 MG tablet Take 1 tablet (100 mg total) by mouth daily.   30 tablet  6  .  ciprofloxacin (CIPRO) 250 MG tablet Take 250 mg by mouth 2 (two) times daily.        . digoxin (LANOXIN) 0.125 MG tablet Take 1 tablet (125 mcg total) by mouth daily.  30 tablet  6  . diltiazem (CARDIZEM) 30 MG tablet Take 1 tablet (30 mg total) by mouth as needed.  120 tablet  6  . gentamicin (GARAMYCIN) 0.1 % cream Apply 1 application topically 3 (three) times daily.        [name] LORazepam (ATIVAN) 0.5 MG tablet Take 0.5 mg by mouth 3 (three) times daily as needed.        . Multiple Vitamins-Minerals (EYE VITAMINS) CAPS Take by mouth daily.        [name] neomycin-bacitracin-polymyxin (NEOSPORIN) ointment Apply topically as needed.        [name] omeprazole (PRILOSEC) 40 MG capsule Take 40 mg by mouth 2 (two) times daily.        [name] torsemide (DEMADEX) 20 MG tablet Take 10 mg by mouth daily. As needed.       . traZODone (DESYREL) 50 MG tablet Take 50 mg by mouth at bedtime.        [name] warfarin (COUMADIN) 2.5 MG tablet Take 2.5 mg by mouth. Take as directed Saturday and Monday.       . warfarin (COUMADIN) 5 MG tablet Take 1 tablet (5 mg total) by mouth as directed.  45 tablet  3  . atenolol (TENORMIN) 100 MG tablet Take 1 tablet (100 mg total) by mouth daily.  30 tablet  6  . diltiazem (CARDIZEM) 30 MG tablet Take 1 tablet (30 mg total) by mouth 4 (four) times daily.  120 tablet  6  . DISCONTD: triamterene-hydrochlorothiazide (MAXZIDE-25) 37.5-25 MG per tablet Take 1 tablet by mouth daily.  30 tablet  6     Review of Systems  HENT: Negative.   Eyes: Negative.   Respiratory: Negative.   Cardiovascular: Positive for leg swelling.  Gastrointestinal: Negative.   Musculoskeletal: Positive for gait problem.  Skin: Negative.   Neurological: Positive for weakness.  Hematological: Negative.   Psychiatric/Behavioral: Negative.   All other systems reviewed and are negative.    BP 121/69  Pulse 64  Ht 5\' 4"  (1.626 m)  Wt 145 lb (65.772 kg)  BMI 24.89 kg/m2   Physical Exam  Nursing note and vitals  reviewed. Constitutional: She is oriented to person, place, and time. She appears well-developed and well-nourished.  HENT:  Head: Normocephalic.  Nose: Nose normal.  Mouth/Throat: Oropharynx is clear and moist.  Eyes: Conjunctivae are normal. Pupils are equal, round, and reactive to light.  Neck: Normal range of motion. Neck supple. No JVD present.  Cardiovascular: Normal rate, S1 normal, S2 normal and intact distal pulses.  An irregularly irregular rhythm present. Exam reveals no gallop and no friction rub.   Murmur heard.  Crescendo systolic murmur is present with a grade of 1/6  Pulmonary/Chest: Effort normal and breath sounds normal. No respiratory distress. She has no wheezes. She has no rales. She exhibits no tenderness.  Abdominal: Soft. Bowel sounds are normal. She exhibits no distension. There is no tenderness.  Musculoskeletal: Normal range of motion. She exhibits no edema and no tenderness.  Lymphadenopathy:    She has no cervical adenopathy.  Neurological: She is alert and oriented to person, place, and time. Coordination normal.  Skin: Skin is warm and dry. No rash noted. No erythema.       Left lower extremity in a bandage. Right leg with signs of chronic venous insufficiency, skin discoloration as well as signs of chronic sun exposure and various lesions.  Psychiatric: She has a normal mood and affect. Her behavior is normal. Judgment and thought content normal.         Assessment and Plan

## 2011-07-23 NOTE — Assessment & Plan Note (Signed)
Pacemaker checked every 6 months with Dr. Graciela Husbands.

## 2011-07-23 NOTE — Assessment & Plan Note (Signed)
Rate is well controlled though she is having side effects of edema from the diltiazem. We will hold the diltiazem and if needed, take this p.r.n. If she has tachypalpitations, we can also take extra atenolol 50 mg at nighttime.

## 2011-07-23 NOTE — Progress Notes (Signed)
   Patient ID: [name], female    DOB: 10-08-1922, 77 y.o.   MRN: [id number]  HPI Comments: lab draw only     Review of Systems    Physical Exam

## 2011-07-23 NOTE — Assessment & Plan Note (Signed)
We will order an echocardiogram given her history of AVR. It has been over 1-1/2 years since her last echocardiogram.

## 2011-07-23 NOTE — Assessment & Plan Note (Addendum)
She has abdominal aortic aneurysm repair, right brachial artery occlusion. On her last visit, we started Lipitor 10 mg daily. This is currently not on her list. We will call her to confirm that she is taking this.  Goal LDL is less than 70.

## 2011-07-23 NOTE — Assessment & Plan Note (Signed)
Goal LDL less than 70. We will contact her about which statin if any she is taking. Lipitor was started on her last visit.

## 2011-07-23 NOTE — Patient Instructions (Addendum)
You are doing well. Please take the diltiazem as needed for palpitations or fast heart rate. Take extra atenolol 1/2  A pill for fast heart rate.  Please call us if you have new issues that need to be addressed before your next appt.  We will call you for a follow up Appt. In 6 months  Your physician has requested that you have an echocardiogram. Echocardiography is a painless test that uses sound waves to create images of your heart. It provides your doctor with information about the size and shape of your heart and how well your heart's chambers and valves are working. This procedure takes approximately one hour. There are no restrictions for this procedure.

## 2011-07-29 ENCOUNTER — Other Ambulatory Visit (INDEPENDENT_AMBULATORY_CARE_PROVIDER_SITE_OTHER): Payer: Medicare Other | Admitting: *Deleted

## 2011-07-29 DIAGNOSIS — I359 Nonrheumatic aortic valve disorder, unspecified: Secondary | ICD-10-CM

## 2011-07-30 ENCOUNTER — Ambulatory Visit: Payer: Medicare Other | Admitting: Cardiovascular Disease

## 2011-07-30 ENCOUNTER — Encounter: Payer: Medicare Other | Admitting: Emergency Medicine

## 2011-08-04 ENCOUNTER — Encounter: Payer: Self-pay | Admitting: Internal Medicine

## 2011-08-07 LAB — CBC
MCHC: 33.4
Platelets: 148 — ABNORMAL LOW
RDW: 17.2 — ABNORMAL HIGH

## 2011-08-07 LAB — PROTIME-INR
INR: 1.1
Prothrombin Time: 14

## 2011-08-13 ENCOUNTER — Ambulatory Visit (INDEPENDENT_AMBULATORY_CARE_PROVIDER_SITE_OTHER): Payer: Medicare Other | Admitting: Emergency Medicine

## 2011-08-13 DIAGNOSIS — I4891 Unspecified atrial fibrillation: Secondary | ICD-10-CM

## 2011-08-13 DIAGNOSIS — G459 Transient cerebral ischemic attack, unspecified: Secondary | ICD-10-CM

## 2011-08-13 DIAGNOSIS — I359 Nonrheumatic aortic valve disorder, unspecified: Secondary | ICD-10-CM

## 2011-08-13 LAB — POCT INR: INR: 2.1

## 2011-08-14 LAB — CBC
HCT: 40.8
Hemoglobin: 13.5
MCV: 85.5
RBC: 4.77
WBC: 4.9

## 2011-08-14 LAB — COMPREHENSIVE METABOLIC PANEL
Alkaline Phosphatase: 136 — ABNORMAL HIGH
BUN: 12
CO2: 30
Chloride: 96
Creatinine, Ser: 0.87
GFR calc non Af Amer: >60
Glucose, Bld: 98
Potassium: 4
Total Bilirubin: 1.6 — ABNORMAL HIGH

## 2011-08-14 LAB — DIFFERENTIAL
Basophils Absolute: 0
Basophils Relative: 1
Lymphocytes Relative: 35
Monocytes Absolute: 0.5
Neutro Abs: 2.5

## 2011-08-14 LAB — APTT: aPTT: 32

## 2011-08-14 LAB — PROTIME-INR: Prothrombin Time: 18.7 — ABNORMAL HIGH

## 2011-08-15 ENCOUNTER — Other Ambulatory Visit: Payer: Self-pay | Admitting: Obstetrics & Gynecology

## 2011-08-15 DIAGNOSIS — R928 Other abnormal and inconclusive findings on diagnostic imaging of breast: Secondary | ICD-10-CM

## 2011-08-18 ENCOUNTER — Encounter: Payer: Medicare Other | Admitting: Nurse Practitioner

## 2011-08-18 ENCOUNTER — Encounter: Payer: Medicare Other | Admitting: Cardiothoracic Surgery

## 2011-08-20 ENCOUNTER — Encounter: Payer: Medicare Other | Admitting: Emergency Medicine

## 2011-08-25 LAB — COMPREHENSIVE METABOLIC PANEL
ALT: 40 — ABNORMAL HIGH
Alkaline Phosphatase: 216 — ABNORMAL HIGH
CO2: 29
Chloride: 100
Glucose, Bld: 87
Potassium: 3.3 — ABNORMAL LOW
Sodium: 140
Total Bilirubin: 1.1
Total Protein: 6.2

## 2011-08-25 LAB — CBC
HCT: 36.9
Hemoglobin: 12.4
RBC: 4.35
WBC: 5.8

## 2011-08-25 LAB — URINALYSIS, ROUTINE W REFLEX MICROSCOPIC
Glucose, UA: NEGATIVE
Hgb urine dipstick: NEGATIVE
pH: 5.5

## 2011-08-25 LAB — URINE MICROSCOPIC-ADD ON

## 2011-08-25 LAB — DIFFERENTIAL
Basophils Absolute: 0
Basophils Relative: 1
Lymphocytes Relative: 19
Monocytes Absolute: 0.4
Neutro Abs: 3.5
Neutrophils Relative %: 61

## 2011-08-25 LAB — PROTIME-INR: INR: 2.4 — ABNORMAL HIGH

## 2011-08-27 LAB — BASIC METABOLIC PANEL
BUN: 11
BUN: 23
CO2: 26
CO2: 30
CO2: 31
CO2: 31
Calcium: 8.9
Calcium: 8.9
Chloride: 98
Creatinine, Ser: 1.3 — ABNORMAL HIGH
GFR calc Af Amer: 47 — ABNORMAL LOW
GFR calc non Af Amer: 39 — ABNORMAL LOW
Glucose, Bld: 101 — ABNORMAL HIGH
Glucose, Bld: 122 — ABNORMAL HIGH
Glucose, Bld: 91
Glucose, Bld: 95
Potassium: 3.8
Potassium: 3.9
Sodium: 135
Sodium: 136

## 2011-08-27 LAB — I-STAT 8, (EC8 V) (CONVERTED LAB)
Bicarbonate: 25.5 — ABNORMAL HIGH
Glucose, Bld: 98
HCT: 39
Hemoglobin: 13.3
Operator id: 288831
Potassium: 4.3
Sodium: 137
TCO2: 27

## 2011-08-27 LAB — PROTIME-INR
INR: 2.7 — ABNORMAL HIGH
INR: 3.6 — ABNORMAL HIGH
Prothrombin Time: 35 — ABNORMAL HIGH
Prothrombin Time: 36.9 — ABNORMAL HIGH

## 2011-08-27 LAB — DIFFERENTIAL
Basophils Absolute: 0
Basophils Relative: 1
Eosinophils Absolute: 0.2
Eosinophils Relative: 5
Lymphocytes Relative: 23
Lymphs Abs: 1
Monocytes Absolute: 0.4
Monocytes Relative: 9
Neutro Abs: 2.9
Neutrophils Relative %: 63

## 2011-08-27 LAB — CBC
HCT: 35.8 — ABNORMAL LOW
Hemoglobin: 11.6 — ABNORMAL LOW
MCHC: 32.5
MCV: 86
Platelets: 231
RBC: 4.16
RDW: 17.8 — ABNORMAL HIGH
WBC: 4.6

## 2011-08-27 LAB — LIPID PANEL
LDL Cholesterol: 72
Total CHOL/HDL Ratio: 2.5
VLDL: 13

## 2011-08-27 LAB — B-NATRIURETIC PEPTIDE (CONVERTED LAB): Pro B Natriuretic peptide (BNP): 393 — ABNORMAL HIGH

## 2011-08-27 LAB — POCT I-STAT CREATININE: Operator id: 288831

## 2011-08-28 LAB — TSH
TSH: 2.499
TSH: 2.572

## 2011-08-28 LAB — BASIC METABOLIC PANEL
BUN: 13
BUN: 17
BUN: 23
Chloride: 105
Creatinine, Ser: 0.95
Creatinine, Ser: 1.06
GFR calc non Af Amer: 50 — ABNORMAL LOW
GFR calc non Af Amer: 51 — ABNORMAL LOW
GFR calc non Af Amer: 56 — ABNORMAL LOW
Glucose, Bld: 91
Potassium: 4
Potassium: 4.2
Sodium: 137

## 2011-08-28 LAB — PREPARE FRESH FROZEN PLASMA

## 2011-08-28 LAB — CARDIAC PANEL(CRET KIN+CKTOT+MB+TROPI)
CK, MB: 0.3
CK, MB: 1.8
CK, MB: 2.1
Relative Index: INVALID
Total CK: 60
Total CK: 63
Troponin I: 0.02
Troponin I: 0.05
Troponin I: 0.19 — ABNORMAL HIGH

## 2011-08-28 LAB — COMPREHENSIVE METABOLIC PANEL
Albumin: 3.2 — ABNORMAL LOW
Alkaline Phosphatase: 125 — ABNORMAL HIGH
BUN: 17
CO2: 25
Chloride: 103
GFR calc non Af Amer: 43 — ABNORMAL LOW
Potassium: 4.1
Total Bilirubin: 0.9

## 2011-08-28 LAB — PROTIME-INR
INR: 2.4 — ABNORMAL HIGH
INR: 2.5 — ABNORMAL HIGH
INR: 3.2 — ABNORMAL HIGH
INR: 8.1
Prothrombin Time: 24.8 — ABNORMAL HIGH
Prothrombin Time: 26.6 — ABNORMAL HIGH
Prothrombin Time: 27.9 — ABNORMAL HIGH
Prothrombin Time: 27.9 — ABNORMAL HIGH
Prothrombin Time: 71.4 — ABNORMAL HIGH

## 2011-08-28 LAB — POCT CARDIAC MARKERS
Myoglobin, poc: 73.9
Myoglobin, poc: 88.8
Operator id: 270111
Operator id: 270111

## 2011-08-28 LAB — I-STAT 8, (EC8 V) (CONVERTED LAB)
Acid-Base Excess: 1
Acid-Base Excess: 1
BUN: 24 — ABNORMAL HIGH
Bicarbonate: 22.3
Chloride: 102
Glucose, Bld: 101 — ABNORMAL HIGH
HCT: 37
Operator id: 275731
Potassium: 4.5
TCO2: 23
pCO2, Ven: 26.7 — ABNORMAL LOW
pH, Ven: 7.373 — ABNORMAL HIGH
pH, Ven: 7.53 — ABNORMAL HIGH

## 2011-08-28 LAB — D-DIMER, QUANTITATIVE: D-Dimer, Quant: 1.17 — ABNORMAL HIGH

## 2011-08-28 LAB — CBC
HCT: 31.7 — ABNORMAL LOW
HCT: 34.9 — ABNORMAL LOW
HCT: 35.1 — ABNORMAL LOW
Hemoglobin: 10.5 — ABNORMAL LOW
Hemoglobin: 11.5 — ABNORMAL LOW
MCV: 83.8
MCV: 84
Platelets: 155
Platelets: 167
Platelets: 182
Platelets: 191
RBC: 4.15
WBC: 4.3
WBC: 4.8
WBC: 7.4
WBC: 8.2
WBC: 8.2

## 2011-08-28 LAB — TROPONIN I: Troponin I: 0.02

## 2011-08-28 LAB — APTT: aPTT: 48 — ABNORMAL HIGH

## 2011-08-28 LAB — POCT I-STAT CREATININE: Creatinine, Ser: 1.2

## 2011-08-28 LAB — ABO/RH: ABO/RH(D): A POS

## 2011-08-28 LAB — DIFFERENTIAL
Eosinophils Relative: 1
Lymphocytes Relative: 17
Lymphs Abs: 0.8

## 2011-08-28 LAB — CK TOTAL AND CKMB (NOT AT ARMC): Relative Index: INVALID

## 2011-09-03 ENCOUNTER — Encounter: Payer: Medicare Other | Admitting: Emergency Medicine

## 2011-09-05 ENCOUNTER — Other Ambulatory Visit: Payer: Self-pay

## 2011-09-05 MED ORDER — DIGOXIN 125 MCG PO TABS: 125.0000 ug | ORAL_TABLET | Freq: Every day | ORAL | Status: DC

## 2011-09-10 ENCOUNTER — Ambulatory Visit
Admission: RE | Admit: 2011-09-10 | Discharge: 2011-09-10 | Disposition: A | Discharge status: Home / Self Care | Payer: Medicare Other | Source: Ambulatory Visit | Attending: Obstetrics & Gynecology | Admitting: Obstetrics & Gynecology

## 2011-09-10 ENCOUNTER — Ambulatory Visit (INDEPENDENT_AMBULATORY_CARE_PROVIDER_SITE_OTHER): Payer: Medicare Other | Admitting: Emergency Medicine

## 2011-09-10 DIAGNOSIS — G459 Transient cerebral ischemic attack, unspecified: Secondary | ICD-10-CM

## 2011-09-10 DIAGNOSIS — R928 Other abnormal and inconclusive findings on diagnostic imaging of breast: Secondary | ICD-10-CM

## 2011-09-10 DIAGNOSIS — I4891 Unspecified atrial fibrillation: Secondary | ICD-10-CM

## 2011-09-10 DIAGNOSIS — I359 Nonrheumatic aortic valve disorder, unspecified: Secondary | ICD-10-CM

## 2011-09-18 ENCOUNTER — Encounter: Payer: Medicare Other | Admitting: Nurse Practitioner

## 2011-09-18 ENCOUNTER — Encounter: Payer: Medicare Other | Admitting: Cardiothoracic Surgery

## 2011-09-24 ENCOUNTER — Ambulatory Visit (INDEPENDENT_AMBULATORY_CARE_PROVIDER_SITE_OTHER): Payer: Medicare Other | Admitting: Emergency Medicine

## 2011-09-24 DIAGNOSIS — I4891 Unspecified atrial fibrillation: Secondary | ICD-10-CM

## 2011-09-24 DIAGNOSIS — I359 Nonrheumatic aortic valve disorder, unspecified: Secondary | ICD-10-CM

## 2011-09-24 DIAGNOSIS — G459 Transient cerebral ischemic attack, unspecified: Secondary | ICD-10-CM

## 2011-10-08 ENCOUNTER — Ambulatory Visit (INDEPENDENT_AMBULATORY_CARE_PROVIDER_SITE_OTHER): Payer: Medicare Other | Admitting: Emergency Medicine

## 2011-10-08 ENCOUNTER — Ambulatory Visit: Payer: Medicare Other | Admitting: *Deleted

## 2011-10-08 DIAGNOSIS — I4891 Unspecified atrial fibrillation: Secondary | ICD-10-CM

## 2011-10-08 DIAGNOSIS — G459 Transient cerebral ischemic attack, unspecified: Secondary | ICD-10-CM

## 2011-10-08 DIAGNOSIS — I359 Nonrheumatic aortic valve disorder, unspecified: Secondary | ICD-10-CM

## 2011-10-08 DIAGNOSIS — I5032 Chronic diastolic (congestive) heart failure: Secondary | ICD-10-CM

## 2011-10-08 DIAGNOSIS — Z79899 Other long term (current) drug therapy: Secondary | ICD-10-CM

## 2011-10-09 LAB — DIGOXIN LEVEL: Digoxin Level: 0.9 ng/mL (ref 0.9–2.0)

## 2011-10-18 ENCOUNTER — Encounter: Payer: Medicare Other | Admitting: Nurse Practitioner

## 2011-10-18 ENCOUNTER — Encounter: Payer: Medicare Other | Admitting: Cardiothoracic Surgery

## 2011-10-29 ENCOUNTER — Ambulatory Visit (INDEPENDENT_AMBULATORY_CARE_PROVIDER_SITE_OTHER): Payer: Medicare Other | Admitting: Emergency Medicine

## 2011-10-29 DIAGNOSIS — I359 Nonrheumatic aortic valve disorder, unspecified: Secondary | ICD-10-CM

## 2011-10-29 DIAGNOSIS — I4891 Unspecified atrial fibrillation: Secondary | ICD-10-CM

## 2011-10-29 DIAGNOSIS — G459 Transient cerebral ischemic attack, unspecified: Secondary | ICD-10-CM

## 2011-11-05 ENCOUNTER — Encounter: Payer: Medicare Other | Admitting: *Deleted

## 2011-11-18 ENCOUNTER — Encounter: Payer: Self-pay | Admitting: Nurse Practitioner

## 2011-11-18 ENCOUNTER — Encounter: Payer: Self-pay | Admitting: Cardiothoracic Surgery

## 2011-11-26 ENCOUNTER — Encounter: Payer: Medicare Other | Admitting: Emergency Medicine

## 2011-12-04 ENCOUNTER — Ambulatory Visit (INDEPENDENT_AMBULATORY_CARE_PROVIDER_SITE_OTHER): Payer: Medicare Other | Admitting: *Deleted

## 2011-12-04 ENCOUNTER — Encounter: Payer: Self-pay | Admitting: Internal Medicine

## 2011-12-04 DIAGNOSIS — I4891 Unspecified atrial fibrillation: Secondary | ICD-10-CM

## 2011-12-04 DIAGNOSIS — G459 Transient cerebral ischemic attack, unspecified: Secondary | ICD-10-CM

## 2011-12-04 DIAGNOSIS — I359 Nonrheumatic aortic valve disorder, unspecified: Secondary | ICD-10-CM

## 2011-12-04 DIAGNOSIS — I495 Sick sinus syndrome: Secondary | ICD-10-CM

## 2011-12-04 LAB — PACEMAKER DEVICE OBSERVATION
BMOD-0005RV: 95 {beats}/min
BRDY-0002RV: 60 {beats}/min
RV LEAD IMPEDENCE PM: 605 Ohm

## 2011-12-24 ENCOUNTER — Ambulatory Visit (INDEPENDENT_AMBULATORY_CARE_PROVIDER_SITE_OTHER): Payer: Medicare Other | Admitting: Emergency Medicine

## 2011-12-24 DIAGNOSIS — G459 Transient cerebral ischemic attack, unspecified: Secondary | ICD-10-CM

## 2011-12-24 DIAGNOSIS — I4891 Unspecified atrial fibrillation: Secondary | ICD-10-CM

## 2011-12-24 DIAGNOSIS — I359 Nonrheumatic aortic valve disorder, unspecified: Secondary | ICD-10-CM

## 2012-01-05 ENCOUNTER — Encounter: Payer: Self-pay | Admitting: Vascular Surgery

## 2012-01-05 ENCOUNTER — Telehealth: Payer: Self-pay | Admitting: Family Medicine

## 2012-01-06 ENCOUNTER — Ambulatory Visit (INDEPENDENT_AMBULATORY_CARE_PROVIDER_SITE_OTHER): Payer: Medicare Other | Admitting: Vascular Surgery

## 2012-01-06 ENCOUNTER — Encounter (INDEPENDENT_AMBULATORY_CARE_PROVIDER_SITE_OTHER): Payer: Medicare Other | Admitting: *Deleted

## 2012-01-06 ENCOUNTER — Encounter: Payer: Self-pay | Admitting: Vascular Surgery

## 2012-01-06 VITALS — BP 166/63 | HR 87 | Resp 16 | Ht 64.0 in | Wt 139.1 lb

## 2012-01-06 DIAGNOSIS — I714 Abdominal aortic aneurysm, without rupture: Secondary | ICD-10-CM

## 2012-01-06 DIAGNOSIS — IMO0001 Reserved for inherently not codable concepts without codable children: Secondary | ICD-10-CM

## 2012-01-06 NOTE — Progress Notes (Signed)
Vascular and Vein Specialist of [facility]   Patient name: [name] MRN: [id number] DOB: [birth year] Sex: female   Referred by: Retta Diones  Reason for referral:  Chief Complaint  Patient presents with  . New Evaluation    referral from [facility]  . AAA    HISTORY OF PRESENT ILLNESS: The patient has an extensive past medical history. He had a prior aortic valve and what sounds like aortic arch replacement in [place] in 2006. She had been a difficult time: This procedure eventually recovered. She is on chronic Coumadin related to this. She does have a history of descending thoracic and thoracodorsal aneurysm. I have a recent CT scan for review an attempt to repair this to prior study in 2011. She has had a recent symptoms of some rib discomfort radiating to her flank. Is on the left side.  Past Medical History  Diagnosis Date  . Bradycardia     Severe in the setting of permanent atrial fibrillation with tachybrady syndrome s/p single-chamber Medtronic pacemaker placement 08/2007  . Permanent atrial fibrillation   . Tachy-brady syndrome   . S/P placement of cardiac pacemaker   . Brain aneurysm     Hx of  . Hypertension   . Thoracic aneurysm   . AAA (abdominal aortic aneurysm)     S/P resection  . S/P aortic valve replacement with bioprosthetic valve     In the [facility] prior to moving here  . Seizures   . Diastolic heart failure   . Diverticulosis   . Hemorrhoids   . Gastritis   . Hepatitis B 1980's    Past Surgical History  Procedure Date  . Cerebral aneurysm repair   . Abdominal aortic aneurysm repair     Resection  . Aortic valve replacement     With bioprosthetic valve  . Pacemaker placement 08/2007    Medtronic; AF permanent; s/p pacemaker for bradycardia with now well controlled ventricular response; Digoxin therapy, question level; Lower extremity venous issues  . Breast biopsy   . Cholecystectomy   . Vesicovaginal fistula closure w/ tah     . Appendectomy     History   Social History  . Marital Status: Widowed    Spouse Name: N/A    Number of Children: N/A  . Years of Education: N/A   Occupational History  . Retired    Social History Main Topics  . Smoking status: Never Smoker   . Smokeless tobacco: Not on file  . Alcohol Use: No  . Drug Use: No  . Sexually Active: Not on file   Other Topics Concern  . Not on file   Social History Narrative   Married    History reviewed. No pertinent family history.  Allergies as of 01/06/2012 - Review Complete 01/06/2012  Allergen Reaction Noted  . Advair hfa    . Furosemide    . Iodine    . Iohexol  11/26/2007  . Latex  12/04/2011  . Penicillins    . Sulfonamide derivatives    . Verapamil      Current Outpatient Prescriptions on File Prior to Visit  Medication Sig Dispense Refill  . atenolol (TENORMIN) 100 MG tablet Take 1 tablet (100 mg total) by mouth daily. Take an extra 1/2 tablet for fast heart beat.  30 tablet  6  . digoxin (LANOXIN) 0.125 MG tablet Take 1 tablet (125 mcg total) by mouth daily.  30 tablet  8  . Multiple Vitamins-Minerals (EYE VITAMINS) CAPS Take by  mouth daily.        [name] omeprazole (PRILOSEC) 40 MG capsule Take 40 mg by mouth 2 (two) times daily.        [name] torsemide (DEMADEX) 20 MG tablet Take 10 mg by mouth daily. As needed.       . traZODone (DESYREL) 50 MG tablet Take 50 mg by mouth at bedtime.        . triamterene-hydrochlorothiazide (MAXZIDE) 75-50 MG per tablet Take 1 tablet by mouth daily.      [name] warfarin (COUMADIN) 2.5 MG tablet Take 2.5 mg by mouth. TAKE 2.5 MG AS DIRECTED ON Tuesday, Wednesday, Thursday AND Sunday.      . warfarin (COUMADIN) 5 MG tablet Take 1 tablet (5 mg total) by mouth as directed.  45 tablet  3  . ciprofloxacin (CIPRO) 250 MG tablet Take 250 mg by mouth 2 (two) times daily.        [name] diltiazem (CARDIZEM) 30 MG tablet Take 1 tablet (30 mg total) by mouth as needed.  120 tablet  6  . gentamicin (GARAMYCIN) 0.1 %  cream Apply 1 application topically 3 (three) times daily.        [name] LORazepam (ATIVAN) 0.5 MG tablet Take 0.5 mg by mouth 3 (three) times daily as needed.        . neomycin-bacitracin-polymyxin (NEOSPORIN) ointment Apply topically as needed.           REVIEW OF SYSTEMS:  Positives indicated with an "X"  CARDIOVASCULAR:  [ ]  chest pain   [ ]  chest pressure   [ ]  palpitations   [ ]  orthopnea   [x ] dyspnea on exertion   [x ] claudication   [ ]  rest pain   [ ]  DVT   [ ]  phlebitis PULMONARY:   [ ]  productive cough   [ ]  asthma   [ ]  wheezing NEUROLOGIC:   [x ] weakness  [ ]  paresthesias  [ ]  aphasia  [ ]  amaurosis  [x ] dizziness HEMATOLOGIC:   [ ]  bleeding problems   [ ]  clotting disorders MUSCULOSKELETAL:  [ ]  joint pain   [ ]  joint swelling GASTROINTESTINAL: [ ]   blood in stool  [ ]   hematemesis GENITOURINARY:  [ ]   dysuria  [ ]   hematuria PSYCHIATRIC:  [ ]  history of major depression INTEGUMENTARY:  [x ] rashes  [x ] ulcers CONSTITUTIONAL:  [ ]  fever   [ ]  chills  PHYSICAL EXAMINATION:  General: The patient is a well-nourished female, in no acute distress. Vital signs are BP 166/63  Pulse 87  Resp 16  Ht 5\' 4"  (1.626 m)  Wt 139 lb 1.6 oz (63.095 kg)  BMI 23.88 kg/m2 Pulmonary: There is a good air exchange bilaterally without wheezing or rales. Abdomen: Soft and non-tender with normal pitch bowel sounds. Musculoskeletal: There are no major deformities.  There is no significant extremity pain. Neurologic: No focal weakness or paresthesias are detected, Skin: There are no ulcer or rashes noted. He has no changes of chronic venous hypertension with hemosiderin deposits at the level of her distal calves and ankles bilaterally. Psychiatric: The patient has normal affect. Cardiovascular: There is a irregular rate and rhythm without significant murmur appreciated. Do not palpate abdominal aortic aneurysm. He does have prominent aortic pulsation with no tenderness. Pulses are diminished  in her radial and absent pedal pulses bilaterally  [facility] Vascular Lab Studies:  Ordered and Independently Reviewed this reveals a maximal diameter of her infrarenal aorta a  3.3 cm  Impression and Plan:  Had a long discussion with the patient and her daughter present. I do not feel that she's had any significant increase in size. The prediction was 5 cm of her descending thoracic aorta. She does have some tortuosity in this is in a longitudinal projection I feel this may be somewhat overcall. I explained it is reassuring that she has had no change in several years. I have recommended a repeat CT scan in one year to rule out any change. She magnitude of this repair an 78 year old debilitated woman would be quite large. I did explain that she could have rupture of a 5 cm aneurysm but certainly her repair with risk would be much higher. She understands and will see [place] in one year with repeat CT . I do not feel her rib discomfort is related to her aneurysm. She in fact course this is been markedly improved over the last several days.    EARLY, TODD [facility] [place] Office: [phone]

## 2012-01-06 NOTE — Telephone Encounter (Signed)
error 

## 2012-01-08 NOTE — Procedures (Unsigned)
DUPLEX ULTRASOUND OF ABDOMINAL AORTA  INDICATION:  Thoracic aneurysm.  HISTORY: Diabetes:  No Cardiac:  CHF, atrial fibrillation, CABG Hypertension:  Yes Smoking:  No Connective Tissue Disorder: Family History:  No Previous Surgery:  Aortic aneurysm repair  DUPLEX EXAM:         AP (cm)                   TRANSVERSE (cm) Proximal             3.30 cm                   Not visualized Mid                  1.34 cm                   1.4 cm Distal               Not visualized            Not visualized Right Iliac          0.89 cm                   0.96 cm Left Iliac           0.75 cm                   0.75 cm  PREVIOUS:  CT (thoracic)  AP:  5.0  TRANSVERSE:  3.8  IMPRESSION: 1. Abdominal aortic aneurysm noted in the proximal segment measuring     3.30 cm AP. 2. Limited visualization due to overlying bowel gas.  ___________________________________________ Larina Earthly, M.D.  EM/MEDQ  D:  01/06/2012  T:  01/06/2012  Job:  [id number]

## 2012-01-21 ENCOUNTER — Encounter: Payer: Self-pay | Admitting: Cardiovascular Disease

## 2012-01-21 ENCOUNTER — Ambulatory Visit (INDEPENDENT_AMBULATORY_CARE_PROVIDER_SITE_OTHER): Payer: Medicare Other

## 2012-01-21 ENCOUNTER — Ambulatory Visit (INDEPENDENT_AMBULATORY_CARE_PROVIDER_SITE_OTHER): Payer: Medicare Other | Admitting: Cardiovascular Disease

## 2012-01-21 DIAGNOSIS — I359 Nonrheumatic aortic valve disorder, unspecified: Secondary | ICD-10-CM

## 2012-01-21 DIAGNOSIS — I4891 Unspecified atrial fibrillation: Secondary | ICD-10-CM

## 2012-01-21 DIAGNOSIS — G459 Transient cerebral ischemic attack, unspecified: Secondary | ICD-10-CM

## 2012-01-21 DIAGNOSIS — R011 Cardiac murmur, unspecified: Secondary | ICD-10-CM

## 2012-01-21 DIAGNOSIS — I714 Abdominal aortic aneurysm, without rupture: Secondary | ICD-10-CM

## 2012-01-21 DIAGNOSIS — Z950 Presence of cardiac pacemaker: Secondary | ICD-10-CM

## 2012-01-21 DIAGNOSIS — E785 Hyperlipidemia, unspecified: Secondary | ICD-10-CM

## 2012-01-21 DIAGNOSIS — I509 Heart failure, unspecified: Secondary | ICD-10-CM

## 2012-01-21 DIAGNOSIS — I5032 Chronic diastolic (congestive) heart failure: Secondary | ICD-10-CM

## 2012-01-21 NOTE — Assessment & Plan Note (Signed)
She does not want a cholesterol medication. I suspect her cholesterol will be lower with her recent 10 pound weight loss

## 2012-01-21 NOTE — Assessment & Plan Note (Signed)
She has followup with [place] [facility].

## 2012-01-21 NOTE — Assessment & Plan Note (Signed)
History of aVR. Echocardiogram September 2012 showed bowel functioning normally.

## 2012-01-21 NOTE — Assessment & Plan Note (Signed)
She is currently not taking a diuretic on a regular basis. She has had 10 pound weight loss, has decreased oral fluid intake. We have suggested she take torsemide only for weight gain, increasing edema or shortness of breath.

## 2012-01-21 NOTE — Assessment & Plan Note (Signed)
Rate is well controlled on her current medication regimen. No changes were made to her medications.

## 2012-01-21 NOTE — Patient Instructions (Signed)
You are doing well. No medication changes were made.  Take torsemide for weight gain of three pounds  Please call us if you have new issues that need to be addressed before your next appt.  Your physician wants you to follow-up in: 6 months.  You will receive a reminder letter in the mail two months in advance. If you don't receive a letter, please call our office to schedule the follow-up appointment.

## 2012-01-21 NOTE — Progress Notes (Signed)
Patient ID: [name], female    DOB: 1922/03/12, 78 y.o.   MRN: [id number]  HPI Comments: 78 yo WF with history of tachy-brady syndrome s/p pacemaker, Permanent atrial fibrillation on warfarin, HTN, AVR, AAA with repair 4-5 years ago, and chronic diastolic heart failure,  right brachial arterial stenosis, With TIA in 2012,  presenting for routine followup. No significant coronary artery disease or carotid arterial disease  admitted to the hospital at [facility] on Apr 16 2011 with shortness of breath, acute on chronic diastolic heart failure, underlying hypertension.  possible  pneumonia. White blood cell count was 13. Antibiotics were discontinued after her chest x-ray cleared.  On her last clinic visit, diltiazem was held secondary to lower extremity edema. She reports that she has been not needing additional atenolol or diltiazem for rate control. She has not been taking torsemide on a regular basis. She has had anorexia with her pneumonias.   She has lost 10 pounds since her last clinic visit. She has been very sick with pneumonia in January 2013 and again recently. She has had several courses of antibiotics.   Recent echocardiogram in September 2012 showing normal ejection fraction, mild to moderate pulmonary hypertension, moderate MR and TR cardiac catheterization over 5 years ago showing no significant coronary artery disease, recent CT scan showing stable descending aorta and after repair, minimal dilatation of the descending aorta, stable-appearing aortic valve.   EKG shows atrial fibrillation With a rate of 72 beats per minute, right bundle branch block, left anterior fascicular block, rare paced complexes     Outpatient Encounter Prescriptions as of 01/21/2012  Medication Sig Dispense Refill  . atenolol (TENORMIN) 100 MG tablet Take 1 tablet (100 mg total) by mouth daily. Take an extra 1/2 tablet for fast heart beat.  30 tablet  6  . benzonatate (TESSALON) 100 MG capsule Take 100 mg by  mouth 3 (three) times daily as needed.      . digoxin (LANOXIN) 0.125 MG tablet Take 1 tablet (125 mcg total) by mouth daily.  30 tablet  8  . guaiFENesin-codeine (ROBITUSSIN AC) 100-10 MG/5ML syrup Take 5 mLs by mouth 3 (three) times daily as needed.      [name] LORazepam (ATIVAN) 0.5 MG tablet Take 0.5 mg by mouth 3 (three) times daily as needed.        . Multiple Vitamins-Minerals (EYE VITAMINS) CAPS Take by mouth daily.        [name] neomycin-bacitracin-polymyxin (NEOSPORIN) ointment Apply topically as needed.        . nystatin (MYCOSTATIN) 100000 UNIT/ML suspension Take 500,000 Units by mouth 4 (four) times daily.      [name] omeprazole (PRILOSEC) 40 MG capsule Take 40 mg by mouth 2 (two) times daily.        [name] torsemide (DEMADEX) 20 MG tablet Take 10 mg by mouth daily. As needed. (NOT TAKING)      . traZODone (DESYREL) 50 MG tablet Take 50 mg by mouth at bedtime.        . triamterene-hydrochlorothiazide (MAXZIDE) 75-50 MG per tablet Take 1 tablet by mouth daily.      [name] warfarin (COUMADIN) 2.5 MG tablet Take 2.5 mg by mouth. TAKE 2.5 MG AS DIRECTED ON Tuesday, Wednesday, Thursday AND Sunday.      . warfarin (COUMADIN) 5 MG tablet Take 1 tablet (5 mg total) by mouth as directed.  45 tablet  3  . diltiazem (CARDIZEM) 30 MG tablet Take 30 mg by mouth 4 (four) times daily.  As needed (NOT TAKING)        Review of Systems  Constitutional: Positive for unexpected weight change.  HENT: Negative.   Eyes: Negative.   Respiratory: Negative.   Gastrointestinal: Negative.   Musculoskeletal: Positive for gait problem.  Skin: Negative.   Hematological: Negative.   Psychiatric/Behavioral: Negative.   All other systems reviewed and are negative.    BP 122/78  Pulse 74  Ht 5\' 3"  (1.6 m)  Wt 135 lb (61.236 kg)  BMI 23.91 kg/m2  Physical Exam  Nursing note and vitals reviewed. Constitutional: She is oriented to person, place, and time. She appears well-developed and well-nourished.  HENT:  Head:  Normocephalic.  Nose: Nose normal.  Mouth/Throat: Oropharynx is clear and moist.  Eyes: Conjunctivae are normal. Pupils are equal, round, and reactive to light.  Neck: Normal range of motion. Neck supple. No JVD present.  Cardiovascular: Normal rate, S1 normal, S2 normal and intact distal pulses.  An irregularly irregular rhythm present. Exam reveals no gallop and no friction rub.   Murmur heard.  Crescendo systolic murmur is present with a grade of 1/6  Pulmonary/Chest: Effort normal and breath sounds normal. No respiratory distress. She has no wheezes. She has no rales. She exhibits no tenderness.  Abdominal: Soft. Bowel sounds are normal. She exhibits no distension. There is no tenderness.  Musculoskeletal: Normal range of motion. She exhibits no edema and no tenderness.  Lymphadenopathy:    She has no cervical adenopathy.  Neurological: She is alert and oriented to person, place, and time. Coordination normal.  Skin: Skin is warm and dry. No rash noted. No erythema.  Psychiatric: She has a normal mood and affect. Her behavior is normal. Judgment and thought content normal.         Assessment and Plan

## 2012-01-21 NOTE — Assessment & Plan Note (Signed)
No new neurologic episodes. Stable on warfarin. Recent INR greater than 3. She will followup closely in our clinic for repeat INR check.

## 2012-01-21 NOTE — Assessment & Plan Note (Signed)
Abdominal aortic aneurysm of 3 cm.

## 2012-01-28 ENCOUNTER — Encounter: Payer: Self-pay | Admitting: Cardiothoracic Surgery

## 2012-01-28 ENCOUNTER — Encounter: Payer: Self-pay | Admitting: Nurse Practitioner

## 2012-02-02 ENCOUNTER — Other Ambulatory Visit: Payer: Self-pay | Admitting: *Deleted

## 2012-02-02 MED ORDER — WARFARIN SODIUM 5 MG PO TABS: 5.0000 mg | ORAL_TABLET | ORAL | Status: DC

## 2012-02-18 ENCOUNTER — Ambulatory Visit (INDEPENDENT_AMBULATORY_CARE_PROVIDER_SITE_OTHER): Payer: Medicare Other

## 2012-02-18 DIAGNOSIS — G459 Transient cerebral ischemic attack, unspecified: Secondary | ICD-10-CM

## 2012-02-18 DIAGNOSIS — I4891 Unspecified atrial fibrillation: Secondary | ICD-10-CM

## 2012-02-18 DIAGNOSIS — I359 Nonrheumatic aortic valve disorder, unspecified: Secondary | ICD-10-CM

## 2012-02-18 LAB — POCT INR: INR: 3.9

## 2012-02-27 ENCOUNTER — Other Ambulatory Visit: Payer: Self-pay | Admitting: *Deleted

## 2012-02-27 MED ORDER — ATENOLOL 100 MG PO TABS: 100.0000 mg | ORAL_TABLET | Freq: Every day | ORAL | Status: DC

## 2012-03-03 ENCOUNTER — Ambulatory Visit (INDEPENDENT_AMBULATORY_CARE_PROVIDER_SITE_OTHER): Payer: Medicare Other

## 2012-03-03 DIAGNOSIS — I4891 Unspecified atrial fibrillation: Secondary | ICD-10-CM

## 2012-03-03 DIAGNOSIS — G459 Transient cerebral ischemic attack, unspecified: Secondary | ICD-10-CM

## 2012-03-03 DIAGNOSIS — I359 Nonrheumatic aortic valve disorder, unspecified: Secondary | ICD-10-CM

## 2012-03-03 LAB — POCT INR: INR: 3.7

## 2012-03-17 ENCOUNTER — Ambulatory Visit (INDEPENDENT_AMBULATORY_CARE_PROVIDER_SITE_OTHER): Payer: Medicare Other

## 2012-03-17 DIAGNOSIS — I359 Nonrheumatic aortic valve disorder, unspecified: Secondary | ICD-10-CM

## 2012-03-17 DIAGNOSIS — I4891 Unspecified atrial fibrillation: Secondary | ICD-10-CM

## 2012-03-17 DIAGNOSIS — G459 Transient cerebral ischemic attack, unspecified: Secondary | ICD-10-CM

## 2012-03-17 LAB — POCT INR: INR: 2.1

## 2012-03-31 ENCOUNTER — Ambulatory Visit (INDEPENDENT_AMBULATORY_CARE_PROVIDER_SITE_OTHER): Payer: Medicare Other

## 2012-03-31 DIAGNOSIS — I4891 Unspecified atrial fibrillation: Secondary | ICD-10-CM

## 2012-03-31 DIAGNOSIS — I359 Nonrheumatic aortic valve disorder, unspecified: Secondary | ICD-10-CM

## 2012-03-31 DIAGNOSIS — G459 Transient cerebral ischemic attack, unspecified: Secondary | ICD-10-CM

## 2012-04-21 ENCOUNTER — Ambulatory Visit (INDEPENDENT_AMBULATORY_CARE_PROVIDER_SITE_OTHER): Payer: Medicare Other

## 2012-04-21 DIAGNOSIS — I4891 Unspecified atrial fibrillation: Secondary | ICD-10-CM

## 2012-04-21 DIAGNOSIS — I359 Nonrheumatic aortic valve disorder, unspecified: Secondary | ICD-10-CM

## 2012-04-21 DIAGNOSIS — G459 Transient cerebral ischemic attack, unspecified: Secondary | ICD-10-CM

## 2012-04-21 LAB — POCT INR: INR: 2.6

## 2012-05-12 ENCOUNTER — Ambulatory Visit (INDEPENDENT_AMBULATORY_CARE_PROVIDER_SITE_OTHER): Payer: Medicare Other

## 2012-05-12 DIAGNOSIS — I4891 Unspecified atrial fibrillation: Secondary | ICD-10-CM

## 2012-05-12 DIAGNOSIS — G459 Transient cerebral ischemic attack, unspecified: Secondary | ICD-10-CM

## 2012-05-12 DIAGNOSIS — I359 Nonrheumatic aortic valve disorder, unspecified: Secondary | ICD-10-CM

## 2012-05-12 LAB — POCT INR: INR: 2.2

## 2012-06-09 ENCOUNTER — Ambulatory Visit (INDEPENDENT_AMBULATORY_CARE_PROVIDER_SITE_OTHER): Payer: Medicare Other

## 2012-06-09 DIAGNOSIS — G459 Transient cerebral ischemic attack, unspecified: Secondary | ICD-10-CM

## 2012-06-09 DIAGNOSIS — I4891 Unspecified atrial fibrillation: Secondary | ICD-10-CM

## 2012-06-09 DIAGNOSIS — I359 Nonrheumatic aortic valve disorder, unspecified: Secondary | ICD-10-CM

## 2012-06-14 ENCOUNTER — Other Ambulatory Visit: Payer: Self-pay | Admitting: *Deleted

## 2012-06-14 MED ORDER — DIGOXIN 125 MCG PO TABS: 125.0000 ug | ORAL_TABLET | Freq: Every day | ORAL | Status: DC

## 2012-06-14 NOTE — Telephone Encounter (Signed)
Refilled Digoxin. 

## 2012-06-22 ENCOUNTER — Encounter: Payer: Self-pay | Admitting: *Deleted

## 2012-07-01 ENCOUNTER — Ambulatory Visit (INDEPENDENT_AMBULATORY_CARE_PROVIDER_SITE_OTHER): Payer: Medicare Other | Admitting: Internal Medicine

## 2012-07-01 ENCOUNTER — Encounter: Payer: Self-pay | Admitting: Internal Medicine

## 2012-07-01 ENCOUNTER — Ambulatory Visit (INDEPENDENT_AMBULATORY_CARE_PROVIDER_SITE_OTHER): Payer: Medicare Other

## 2012-07-01 VITALS — BP 120/64 | HR 72 | Ht 63.0 in | Wt 134.2 lb

## 2012-07-01 DIAGNOSIS — I4891 Unspecified atrial fibrillation: Secondary | ICD-10-CM

## 2012-07-01 DIAGNOSIS — Z950 Presence of cardiac pacemaker: Secondary | ICD-10-CM

## 2012-07-01 DIAGNOSIS — R0789 Other chest pain: Secondary | ICD-10-CM

## 2012-07-01 DIAGNOSIS — R0602 Shortness of breath: Secondary | ICD-10-CM

## 2012-07-01 DIAGNOSIS — I359 Nonrheumatic aortic valve disorder, unspecified: Secondary | ICD-10-CM

## 2012-07-01 DIAGNOSIS — G459 Transient cerebral ischemic attack, unspecified: Secondary | ICD-10-CM

## 2012-07-01 LAB — PACEMAKER DEVICE OBSERVATION
BRDY-0002RV: 60 {beats}/min
BRDY-0004RV: 130 {beats}/min
RV LEAD THRESHOLD: 0.75 V

## 2012-07-01 LAB — POCT INR: INR: 2.7

## 2012-07-01 MED ORDER — TRIAMTERENE-HCTZ 75-50 MG PO TABS: ORAL_TABLET | ORAL | Status: DC

## 2012-07-01 NOTE — Assessment & Plan Note (Signed)
Stable

## 2012-07-01 NOTE — Assessment & Plan Note (Signed)
The patient's device was interrogated.  The information was reviewed. No changes were made in the programming.    

## 2012-07-01 NOTE — Progress Notes (Signed)
HPI  [name] is a 78 y.o. female Seen in followup for permanent pacer implanted in the setting of permanent atrial fibrillation and bradycardia. She has prior TIA with her INR  target 2.5-30  She also has a History of HTN, AVR, AAA with repair 4-5 years ago, and chronic diastolic heart failure which she manages with prn toresimide added to her Maxzide.  Echocardiogram in February 2012 showed ejection fraction 50-55%, pulmonary pressures 46 mm of mercury; mild aortic stenosis with intact aortic valve, bioprosthetic valve, otherwise normal study.  cardiac catheterization over 5 years ago showing no significant coronary artery disease,   The patient denies chest pain, shortness of breath, nocturnal dyspnea, orthopnea or peripheral edema.  There have been no palpitations, lightheadedness or syncope. She is doing pretty well.         Past Medical History  Diagnosis Date  . Bradycardia     Severe in the setting of permanent atrial fibrillation with tachybrady syndrome s/p single-chamber Medtronic pacemaker placement 08/2007  . Permanent atrial fibrillation   . Tachy-brady syndrome   . S/P placement of cardiac pacemaker   . Brain aneurysm     Hx of  . Hypertension   . Thoracic aneurysm   . AAA (abdominal aortic aneurysm)     S/P resection  . S/P aortic valve replacement with bioprosthetic valve     In the [facility] prior to moving here  . Seizures   . Diastolic heart failure   . Diverticulosis   . Hemorrhoids   . Gastritis   . Hepatitis B 1980's    Past Surgical History  Procedure Date  . Cerebral aneurysm repair   . Abdominal aortic aneurysm repair     Resection  . Aortic valve replacement     With bioprosthetic valve  . Pacemaker placement 08/2007    Medtronic; AF permanent; s/p pacemaker for bradycardia with now well controlled ventricular response; Digoxin therapy, question level; Lower extremity venous issues  . Breast biopsy   . Cholecystectomy   .  Vesicovaginal fistula closure w/ tah   . Appendectomy     Current Outpatient Prescriptions  Medication Sig Dispense Refill  . atenolol (TENORMIN) 100 MG tablet Take 1 tablet (100 mg total) by mouth daily. Take an extra 1/2 tablet for fast heart beat.  45 tablet  6  . benzonatate (TESSALON) 100 MG capsule Take 100 mg by mouth 3 (three) times daily as needed.      . digoxin (LANOXIN) 0.125 MG tablet Take 1 tablet (125 mcg total) by mouth daily.  30 tablet  6  . diltiazem (CARDIZEM) 30 MG tablet Take 30 mg by mouth 4 (four) times daily. As needed      . guaiFENesin-codeine (ROBITUSSIN AC) 100-10 MG/5ML syrup Take 5 mLs by mouth 3 (three) times daily as needed.      [name] LORazepam (ATIVAN) 0.5 MG tablet Take 0.5 mg by mouth 3 (three) times daily as needed.        . Multiple Vitamins-Minerals (EYE VITAMINS) CAPS Take by mouth daily.        [name] neomycin-bacitracin-polymyxin (NEOSPORIN) ointment Apply topically as needed.        . nystatin (MYCOSTATIN) 100000 UNIT/ML suspension Take 500,000 Units by mouth 4 (four) times daily.      [name] omeprazole (PRILOSEC) 40 MG capsule Take 40 mg by mouth 2 (two) times daily.        . sertraline (ZOLOFT) 50 MG tablet Take 50 mg by mouth daily.      [name]  torsemide (DEMADEX) 20 MG tablet Take 10 mg by mouth daily. As needed.       . traZODone (DESYREL) 50 MG tablet Take 50 mg by mouth at bedtime.        . triamterene-hydrochlorothiazide (MAXZIDE) 75-50 MG per tablet Take 1 tablet by mouth daily.      [name] warfarin (COUMADIN) 2.5 MG tablet Take 2.5 mg by mouth. TAKE 2.5 MG AS DIRECTED ON Tuesday, Wednesday, Thursday AND Sunday.      . warfarin (COUMADIN) 5 MG tablet Take 1 tablet (5 mg total) by mouth as directed.  45 tablet  3    Allergies  Allergen Reactions  . Fluticasone-Salmeterol   . Furosemide   . Iodine   . Iohexol      Desc: patient requires a 13 hr prep, when doing exam with contrast,,,,,fever; chills; dyspnea(slg/04/27/09--given 2 hour solumedrol/ benadryl  prep--no reaction)   . Latex   . Penicillins   . Sulfonamide Derivatives   . Verapamil     Review of Systems negative except from HPI and PMH  Physical Exam BP 120/64  Pulse 72  Ht 5\' 3"  (1.6 m)  Wt 134 lb 4 oz (60.895 kg)  BMI 23.78 kg/m2 Well developed and well nourished in no acute distress HENT normal E scleral and icterus clear Neck Supple JVP flat; carotids brisk and full Clear to ausculation Regular rate and rhythm, no murmurs gallops or rub Soft with active bowel sounds No clubbing cyanosis none Edema Alert and oriented, grossly normal motor and sensory function Skin Warm and Dry    Assessment and  Plan

## 2012-07-01 NOTE — Patient Instructions (Addendum)
Your physician wants you to follow-up in: 6 months with Gunnar Fusi. You will receive a reminder letter in the mail two months in advance. If you don't receive a letter, please call our office to schedule the follow-up appointment.  Your physician wants you to follow-up in: 1 year with Dr. Logan Bores will receive a reminder letter in the mail two months in advance. If you don't receive a letter, please call our office to schedule the follow-up appointment.  Decrease Maxzide to 1/2 tablet daily.  Stop taking digoxin ( lanoxin)

## 2012-07-26 ENCOUNTER — Ambulatory Visit: Payer: Self-pay | Admitting: Internal Medicine

## 2012-08-04 ENCOUNTER — Ambulatory Visit (INDEPENDENT_AMBULATORY_CARE_PROVIDER_SITE_OTHER): Payer: Medicare Other

## 2012-08-04 DIAGNOSIS — I359 Nonrheumatic aortic valve disorder, unspecified: Secondary | ICD-10-CM

## 2012-08-04 DIAGNOSIS — I4891 Unspecified atrial fibrillation: Secondary | ICD-10-CM

## 2012-08-04 DIAGNOSIS — G459 Transient cerebral ischemic attack, unspecified: Secondary | ICD-10-CM

## 2012-08-16 ENCOUNTER — Encounter: Payer: Self-pay | Admitting: Cardiovascular Disease

## 2012-08-16 ENCOUNTER — Ambulatory Visit (INDEPENDENT_AMBULATORY_CARE_PROVIDER_SITE_OTHER): Payer: Medicare Other | Admitting: Cardiovascular Disease

## 2012-08-16 VITALS — BP 110/64 | HR 98 | Ht 63.0 in | Wt 139.8 lb

## 2012-08-16 DIAGNOSIS — I359 Nonrheumatic aortic valve disorder, unspecified: Secondary | ICD-10-CM

## 2012-08-16 DIAGNOSIS — I4891 Unspecified atrial fibrillation: Secondary | ICD-10-CM

## 2012-08-16 DIAGNOSIS — R0602 Shortness of breath: Secondary | ICD-10-CM

## 2012-08-16 DIAGNOSIS — E785 Hyperlipidemia, unspecified: Secondary | ICD-10-CM

## 2012-08-16 DIAGNOSIS — R5383 Other fatigue: Secondary | ICD-10-CM

## 2012-08-16 DIAGNOSIS — I714 Abdominal aortic aneurysm, without rupture: Secondary | ICD-10-CM

## 2012-08-16 DIAGNOSIS — R5381 Other malaise: Secondary | ICD-10-CM

## 2012-08-16 DIAGNOSIS — I5032 Chronic diastolic (congestive) heart failure: Secondary | ICD-10-CM

## 2012-08-16 NOTE — Assessment & Plan Note (Signed)
I suspect she has diastolic CHF. We have suggested she restart her diuretic on a daily basis until her weight decreases in her symptoms improve. She'll take this with potassium.

## 2012-08-16 NOTE — Assessment & Plan Note (Signed)
Repeat lipid panel needed. She's not currently taking a statin. Goal LDL less than 70

## 2012-08-16 NOTE — Assessment & Plan Note (Signed)
Heart rate is elevated today. Uncertain if this is secondary to diastolic CHF. We have asked her to monitor her heart rate at home and call our office with numbers. Try to avoid calcium channel blockers given lower extremity edema and healing wounds.

## 2012-08-16 NOTE — Progress Notes (Signed)
Patient ID: [name], female    DOB: 1922-10-20, 78 y.o.   MRN: [id number]  HPI Comments: 78 yo WF with history of tachy-brady syndrome s/p pacemaker, Permanent atrial fibrillation on warfarin, HTN, AVR, AAA with repair 4-5 years ago, and chronic diastolic heart failure,  right brachial arterial stenosis, With TIA in 2012,  presenting for routine followup. No significant coronary artery disease or carotid arterial disease  admitted to the hospital at [facility] on Apr 16 2011 with shortness of breath, acute on chronic diastolic heart failure, underlying hypertension.  possible  pneumonia. White blood cell count was 13. Antibiotics were discontinued after her chest x-ray cleared.  On her last clinic visit, she had significant lower extremity edema and we suggested using diltiazem sparingly. She reports that her digoxin was held in the past. She has noticed increasing shortness of breath. No dramatic leg edema other than her baseline. She continues to have wounds on her legs from dermatology for second skin cancers. She has had weight gain over the past month approximately 6 pounds since she was seen by Dr. Graciela Husbands. She's not taking her diuretic.  [name]  echocardiogram in September 2012 showing normal ejection fraction, mild to moderate pulmonary hypertension, moderate MR and TR cardiac catheterization over 5 years ago showing no significant coronary artery disease, recent CT scan showing stable descending aorta and after repair, minimal dilatation of the descending aorta, stable-appearing aortic valve.   EKG shows atrial fibrillation With a rate of 98 beats per minute, right bundle branch block, left anterior fascicular block     Outpatient Encounter Prescriptions as of 08/16/2012  Medication Sig Dispense Refill  . atenolol (TENORMIN) 100 MG tablet Take 1 tablet (100 mg total) by mouth daily. Take an extra 1/2 tablet for fast heart beat.  45 tablet  6  . benzonatate (TESSALON) 100 MG capsule Take 100 mg  by mouth 3 (three) times daily as needed.      . diltiazem (CARDIZEM) 30 MG tablet Take 30 mg by mouth 4 (four) times daily. As needed      . guaiFENesin-codeine (ROBITUSSIN AC) 100-10 MG/5ML syrup Take 5 mLs by mouth 3 (three) times daily as needed.      [name] LORazepam (ATIVAN) 0.5 MG tablet Take 0.5 mg by mouth 3 (three) times daily as needed.        . Multiple Vitamins-Minerals (EYE VITAMINS) CAPS Take by mouth daily.        [name] neomycin-bacitracin-polymyxin (NEOSPORIN) ointment Apply topically as needed.        . nystatin (MYCOSTATIN) 100000 UNIT/ML suspension Take 500,000 Units by mouth 4 (four) times daily.      [name] omeprazole (PRILOSEC) 40 MG capsule Take 40 mg by mouth 2 (two) times daily.        . sertraline (ZOLOFT) 50 MG tablet Take 50 mg by mouth daily.      [name] torsemide (DEMADEX) 20 MG tablet Take 10 mg by mouth daily. As needed.       . traZODone (DESYREL) 50 MG tablet Take 50 mg by mouth at bedtime.        . triamterene-hydrochlorothiazide (MAXZIDE) 75-50 MG per tablet Take 1/2 tablet daily      . warfarin (COUMADIN) 2.5 MG tablet Take 2.5 mg by mouth. TAKE 2.5 MG AS DIRECTED ON Tuesday, Wednesday, Thursday AND Sunday.      . warfarin (COUMADIN) 5 MG tablet Take 1 tablet (5 mg total) by mouth as directed.  45 tablet  3  Review of Systems  Constitutional: Positive for unexpected weight change.  HENT: Negative.   Eyes: Negative.   Respiratory: Negative.   Gastrointestinal: Negative.   Musculoskeletal: Positive for gait problem.  Skin: Negative.   Hematological: Negative.   Psychiatric/Behavioral: Negative.   All other systems reviewed and are negative.    BP 110/64  Pulse 98  Ht 5\' 3"  (1.6 m)  Wt 139 lb 12 oz (63.39 kg)  BMI 24.76 kg/m2  Physical Exam  Nursing note and vitals reviewed. Constitutional: She is oriented to person, place, and time. She appears well-developed and well-nourished.  HENT:  Head: Normocephalic.  Nose: Nose normal.  Mouth/Throat: Oropharynx  is clear and moist.  Eyes: Conjunctivae normal are normal. Pupils are equal, round, and reactive to light.  Neck: Normal range of motion. Neck supple. No JVD present.  Cardiovascular: Normal rate, S1 normal, S2 normal and intact distal pulses.  An irregularly irregular rhythm present. Exam reveals no gallop and no friction rub.   Murmur heard.  Crescendo systolic murmur is present with a grade of 1/6  Pulmonary/Chest: Effort normal and breath sounds normal. No respiratory distress. She has no wheezes. She has no rales. She exhibits no tenderness.  Abdominal: Soft. Bowel sounds are normal. She exhibits no distension. There is no tenderness.  Musculoskeletal: Normal range of motion. She exhibits no edema and no tenderness.  Lymphadenopathy:    She has no cervical adenopathy.  Neurological: She is alert and oriented to person, place, and time. Coordination normal.  Skin: Skin is warm and dry. No rash noted. No erythema.  Psychiatric: She has a normal mood and affect. Her behavior is normal. Judgment and thought content normal.         Assessment and Plan

## 2012-08-16 NOTE — Patient Instructions (Addendum)
Please start torsemide once a day with potassium/banana, for a few days for shortness of breath Then take as needed (at least three times a week) for shortness of breath  Monitor your heart rate at home. Call the office with heart rate numbers Please see if you are taking the diltiazem at home  Please call us if you have new issues that need to be addressed before your next appt.  Your physician wants you to follow-up in: 3 months.  You will receive a reminder letter in the mail two months in advance. If you don't receive a letter, please call our office to schedule the follow-up appointment.

## 2012-08-16 NOTE — Assessment & Plan Note (Signed)
Last ultrasound showing AAA a little over 3 cm.

## 2012-08-16 NOTE — Assessment & Plan Note (Signed)
Repeat echocardiogram on next visit. Prosthetic aortic valve well functioning last year. Likely contributing to some mild fluid retention.

## 2012-08-18 ENCOUNTER — Telehealth: Payer: Self-pay | Admitting: Cardiovascular Disease

## 2012-08-18 NOTE — Telephone Encounter (Signed)
Pt calling with an update on BP. Pt is taking diltiazem 30 mg and torsemide 20 mg she is taking half.  10/1 weight 135.4 pulse  68 BP 111/58 am                              pulse 84 BP 126/64  pm  10/2 weight 133.6 BP110/62 pulse 76 am

## 2012-08-18 NOTE — Telephone Encounter (Signed)
Please see below as to what I told pt thanks 

## 2012-08-18 NOTE — Telephone Encounter (Signed)
Pt called back to say she very dizzy and "bumping in to walls".  She says she has had this problem before and it was r/t vertigo. She called earlier with BP readings and weights (see below).  She wants to know if these are related.  I had her check BP while on phone with me. BP right now is 136/71, HR=98 BPM.  She denies any other symptoms of slurred speech, blurred vision, pain, etc.  I reassured her BP is not too low that would suggest orthostasis and may be r/t inner ear issues.  She says she has "exercises" to do that sometimes help with vertigo. She will try these and then will call PCP if this doesn't help. She also wants [place] to know she has diltiazem 30 mg tablets and took 1 this am. I explained she can take, per last note, up to QID PRN. If her HR stays in high 90's-100's, its ok for her to take another 30 mg diltiazem.  Understanding verb. She will try ear exercises, call PCP if needed and will continue to monitor symptoms and BP/HR.

## 2012-08-26 NOTE — Telephone Encounter (Signed)
Pt is stating that she is still SOB and dont know what is causing it. No swelling. Pt took her lasix. Pt weighed 138.2 for 2 days and now is down to 138. Hr is running 86-98

## 2012-08-26 NOTE — Telephone Encounter (Signed)
Pt is concerned that her sob is not any better since her appt. 2 weeks ago.  She has been taking torsemide 20mg  daily for the past 3 days.  She denies le edema.  Weight is only up 0.2# today.  No weight increase at all before today.  Please advise.

## 2012-08-30 ENCOUNTER — Other Ambulatory Visit: Payer: Self-pay | Admitting: *Deleted

## 2012-08-30 MED ORDER — WARFARIN SODIUM 5 MG PO TABS: 5.0000 mg | ORAL_TABLET | ORAL | Status: DC

## 2012-08-31 NOTE — Telephone Encounter (Signed)
LMTCB

## 2012-08-31 NOTE — Telephone Encounter (Signed)
Would limit fluid intake because of shortness of breath Continue diuretic daily at least until symptoms of shortness of breath improves. Take with potassium. She may need an additional dose of diuretic in the early afternoon if symptoms of shortness of breath persist. Previous echocardiogram showed mild to moderate pulmonary hypertension consistent with fluid overload. Prior hospitalization for diastolic CHF. Need to limit her fluid and salt intake and get more fluid out with diuretic.

## 2012-09-01 ENCOUNTER — Other Ambulatory Visit: Payer: Self-pay

## 2012-09-01 ENCOUNTER — Ambulatory Visit (INDEPENDENT_AMBULATORY_CARE_PROVIDER_SITE_OTHER): Payer: Medicare Other

## 2012-09-01 DIAGNOSIS — I509 Heart failure, unspecified: Secondary | ICD-10-CM

## 2012-09-01 DIAGNOSIS — R0602 Shortness of breath: Secondary | ICD-10-CM

## 2012-09-01 DIAGNOSIS — I359 Nonrheumatic aortic valve disorder, unspecified: Secondary | ICD-10-CM

## 2012-09-01 DIAGNOSIS — G459 Transient cerebral ischemic attack, unspecified: Secondary | ICD-10-CM

## 2012-09-01 DIAGNOSIS — I4891 Unspecified atrial fibrillation: Secondary | ICD-10-CM

## 2012-09-01 MED ORDER — POTASSIUM CHLORIDE ER 10 MEQ PO TBCR: 10.0000 meq | EXTENDED_RELEASE_TABLET | Freq: Every day | ORAL | Status: DC

## 2012-09-01 NOTE — Telephone Encounter (Signed)
Pt called back. She says she is still sob. I advised her of Dr. Windell Hummingbird plan (see below). She verbalized understanding New RX for KCL sent to pharmacy She is coming in for PT/INR today. We will check BMP/BNP at that time as well. She will try taking 20 mg torsemide daily x 5-7 days and will let us know how SOB has changed, if any

## 2012-09-02 LAB — BASIC METABOLIC PANEL
BUN/Creatinine Ratio: 29 — ABNORMAL HIGH (ref 11–26)
Chloride: 97 mmol/L (ref 97–108)
GFR calc Af Amer: 59 mL/min/{1.73_m2} — ABNORMAL LOW (ref >59)
GFR calc non Af Amer: 51 mL/min/{1.73_m2} — ABNORMAL LOW (ref >59)
Potassium: 3.7 mmol/L (ref 3.5–5.2)

## 2012-09-07 NOTE — Telephone Encounter (Signed)
Spoke with pt, this morning after getting up to weigh she had an episode of dizziness and weakness. She went and sat down and her bp was 137/108 and her heart rate was 114. She reports it lasted about one hour. She drank Gatorade and has taken it easy the rest of the day. bp and heart rate have returned to normal, she has no energy. She feels she maybe dehydrated. She took no torsemide today or potassium.

## 2012-09-07 NOTE — Telephone Encounter (Signed)
Patient called back to let us know the last day she took the fluid pill was yesterday and so far has dropped 7 pounds, but this morning she had an episode of feeling weak and dizzy and had to sit down.  She checked blood pressure which was 136/108.  Would like to speak with RN regarding her episode this morning.

## 2012-09-07 NOTE — Telephone Encounter (Signed)
Pt weight today was 133 and that is down from 138. Pt told to not take the torsemide tomorrow until she hears from our office. Will forward note to dr Mariah Milling for his review.

## 2012-09-08 NOTE — Telephone Encounter (Signed)
Goal weight will likely be 135 Would back off on torsemide for low weight Dehydration could cause dizziness Would suspect her shortness of breath has improved with weight loss Perhaps overdid it with diuresis We'll need to find happy median

## 2012-09-09 NOTE — Telephone Encounter (Signed)
Pt informed. Understanding verb. Will let us know should sob return/get worse

## 2012-09-17 ENCOUNTER — Other Ambulatory Visit: Payer: Self-pay

## 2012-09-17 MED ORDER — TORSEMIDE 20 MG PO TABS: 10.0000 mg | ORAL_TABLET | Freq: Every day | ORAL | Status: DC

## 2012-09-17 NOTE — Telephone Encounter (Signed)
Refill sent for torsemide.

## 2012-09-22 ENCOUNTER — Ambulatory Visit (INDEPENDENT_AMBULATORY_CARE_PROVIDER_SITE_OTHER): Payer: Medicare Other

## 2012-09-22 DIAGNOSIS — G459 Transient cerebral ischemic attack, unspecified: Secondary | ICD-10-CM

## 2012-09-22 DIAGNOSIS — I359 Nonrheumatic aortic valve disorder, unspecified: Secondary | ICD-10-CM

## 2012-09-22 DIAGNOSIS — I4891 Unspecified atrial fibrillation: Secondary | ICD-10-CM

## 2012-09-22 LAB — POCT INR: INR: 2.8

## 2012-10-20 ENCOUNTER — Ambulatory Visit (INDEPENDENT_AMBULATORY_CARE_PROVIDER_SITE_OTHER): Payer: Medicare Other

## 2012-10-20 DIAGNOSIS — I4891 Unspecified atrial fibrillation: Secondary | ICD-10-CM

## 2012-10-20 DIAGNOSIS — G459 Transient cerebral ischemic attack, unspecified: Secondary | ICD-10-CM

## 2012-10-20 DIAGNOSIS — I359 Nonrheumatic aortic valve disorder, unspecified: Secondary | ICD-10-CM

## 2012-10-20 LAB — POCT INR: INR: 2.7

## 2012-10-25 ENCOUNTER — Telehealth: Payer: Self-pay

## 2012-10-25 MED ORDER — TORSEMIDE 20 MG PO TABS: 10.0000 mg | ORAL_TABLET | Freq: Every day | ORAL | Status: DC

## 2012-10-25 NOTE — Telephone Encounter (Signed)
No testing needed prior to surgery Would make sure her edema is ok. She previously had mild weight gain. Acceptable risk. Prior cath with no significant CAD Will probably need lovenox bridge before and after

## 2012-10-25 NOTE — Telephone Encounter (Signed)
Letter faxed to Dr. Turner Daniels at [facility] See below re: lovenox thanks

## 2012-10-25 NOTE — Telephone Encounter (Signed)
Pt needs cardiac clearance for right total knee replacement Scheduled to see you in January Does she need sooner appt?

## 2012-10-27 NOTE — Telephone Encounter (Signed)
Called spoke with pt, advised per Dr Mariah Milling acceptable risk for surgery, but will need Lovenox bridging while off Coumadin.  Pt is familiar with Lovenox and has bridged before while off Coumadin.  Pt is very hesitant to proceed with surgery.  She does not feel like she is well enough at present.  Pt agrees TCB and notify us if she decides to proceed with surgery so that we can bridge her with Lovenox while off Coumadin.

## 2012-11-24 ENCOUNTER — Encounter: Payer: Self-pay | Admitting: Nurse Practitioner

## 2012-11-24 ENCOUNTER — Ambulatory Visit (INDEPENDENT_AMBULATORY_CARE_PROVIDER_SITE_OTHER): Payer: Medicare Other

## 2012-11-24 ENCOUNTER — Encounter: Payer: Self-pay | Admitting: Cardiothoracic Surgery

## 2012-11-24 DIAGNOSIS — I359 Nonrheumatic aortic valve disorder, unspecified: Secondary | ICD-10-CM

## 2012-11-24 DIAGNOSIS — I4891 Unspecified atrial fibrillation: Secondary | ICD-10-CM

## 2012-11-24 DIAGNOSIS — G459 Transient cerebral ischemic attack, unspecified: Secondary | ICD-10-CM

## 2012-11-24 LAB — POCT INR: INR: 3.4

## 2012-12-01 ENCOUNTER — Ambulatory Visit (INDEPENDENT_AMBULATORY_CARE_PROVIDER_SITE_OTHER): Payer: Medicare Other | Admitting: Cardiovascular Disease

## 2012-12-01 ENCOUNTER — Encounter: Payer: Self-pay | Admitting: Cardiovascular Disease

## 2012-12-01 VITALS — BP 142/76 | HR 63 | Ht 63.0 in | Wt 143.2 lb

## 2012-12-01 DIAGNOSIS — I5032 Chronic diastolic (congestive) heart failure: Secondary | ICD-10-CM

## 2012-12-01 DIAGNOSIS — I4891 Unspecified atrial fibrillation: Secondary | ICD-10-CM

## 2012-12-01 DIAGNOSIS — I739 Peripheral vascular disease, unspecified: Secondary | ICD-10-CM

## 2012-12-01 DIAGNOSIS — I359 Nonrheumatic aortic valve disorder, unspecified: Secondary | ICD-10-CM

## 2012-12-01 DIAGNOSIS — Z950 Presence of cardiac pacemaker: Secondary | ICD-10-CM

## 2012-12-01 NOTE — Assessment & Plan Note (Signed)
Repeat ultrasound on next visit. Last echocardiogram 2012

## 2012-12-01 NOTE — Patient Instructions (Addendum)
You are doing well. No medication changes were made.  Please call us if you have new issues that need to be addressed before your next appt.  Your physician wants you to follow-up in: 6 months.  You will receive a reminder letter in the mail two months in advance. If you don't receive a letter, please call our office to schedule the follow-up appointment.   

## 2012-12-01 NOTE — Assessment & Plan Note (Signed)
Followed by Dr. Klein 

## 2012-12-01 NOTE — Assessment & Plan Note (Signed)
3.3 cm abdominal aortic aneurysm followed in [facility]

## 2012-12-01 NOTE — Progress Notes (Signed)
Patient ID: [name], female    DOB: [birth year], 79 y.o.   MRN: [id number]  HPI Comments: 79 yo WF with history of tachy-brady syndrome s/p pacemaker, Permanent atrial fibrillation on warfarin, HTN, AVR, AAA with repair 4-5 years ago, and chronic diastolic heart failure,  right brachial arterial stenosis, With TIA in 2012,  presenting for routine followup. No significant coronary artery disease or carotid arterial disease Echocardiogram in 2012 showing moderate MR and TR with moderate pulmonary hypertension  admitted to the hospital at [facility] on Apr 16 2011 with shortness of breath, acute on chronic diastolic heart failure, underlying hypertension.  possible  pneumonia. White blood cell count was 13. Antibiotics were discontinued after her chest x-ray cleared.   she has a history of significant lower extremity edema and we have suggested using diltiazem sparingly.  digoxin was held in the past.   She continues to have wounds on her legs from dermatology for second skin cancers. She has been taking torsemide one half dose every other day with Maxzide daily. No significant lower extremity edema. Shortness of breath seems to wax and wane, in general is better. Weight is up several pounds which she attributes to her boot on her right lower extremity. Recently wrapped by the wound clinic for ulcer.  She does have significant knee pain on the right it is delaying surgery as knee pain is better after recent synvisc shot.  cardiac catheterization over 5 years ago showing no significant coronary artery disease, recent CT scan showing stable descending aorta and after repair, minimal dilatation of the descending aorta, stable-appearing aortic valve.   EKG shows paced rhythm with ventricular rate 63 beats per minute   Outpatient Encounter Prescriptions as of 12/01/2012  Medication Sig Dispense Refill  . atenolol (TENORMIN) 100 MG tablet Take 1 tablet (100 mg total) by mouth daily. Take an extra 1/2  tablet for fast heart beat.  45 tablet  6  . LORazepam (ATIVAN) 0.5 MG tablet Take 0.5 mg by mouth 3 (three) times daily as needed.        . Multiple Vitamins-Minerals (EYE VITAMINS) CAPS Take by mouth daily.        [name] neomycin-bacitracin-polymyxin (NEOSPORIN) ointment Apply topically as needed.        [name] omeprazole (PRILOSEC) 40 MG capsule Take 40 mg by mouth 2 (two) times daily.        . potassium chloride (K-DUR) 10 MEQ tablet Take 1 tablet (10 mEq total) by mouth daily.  90 tablet  3  . torsemide (DEMADEX) 20 MG tablet Take 0.5 tablets (10 mg total) by mouth daily. As needed.  15 tablet  1  . traZODone (DESYREL) 50 MG tablet Take 50 mg by mouth at bedtime.       . triamterene-hydrochlorothiazide (MAXZIDE-25) 37.5-25 MG per tablet Take 1 tablet by mouth daily.      [name] warfarin (COUMADIN) 2.5 MG tablet Take 2.5 mg by mouth. TAKE 2.5 MG AS DIRECTED ON Tuesday, Wednesday, Thursday AND Sunday.      . warfarin (COUMADIN) 5 MG tablet Take 1 tablet (5 mg total) by mouth as directed.  45 tablet  3    Review of Systems  HENT: Negative.   Eyes: Negative.   Respiratory: Negative.   Gastrointestinal: Negative.   Musculoskeletal: Positive for gait problem.  Skin: Negative.   Hematological: Negative.   Psychiatric/Behavioral: Negative.   All other systems reviewed and are negative.    BP 142/76  Pulse 63  Ht 5'  3" (1.6 m)  Wt 143 lb 4 oz (64.978 kg)  BMI 25.38 kg/m2  Physical Exam  Nursing note and vitals reviewed. Constitutional: She is oriented to person, place, and time. She appears well-developed and well-nourished.  HENT:  Head: Normocephalic.  Nose: Nose normal.  Mouth/Throat: Oropharynx is clear and moist.  Eyes: Conjunctivae normal are normal. Pupils are equal, round, and reactive to light.  Neck: Normal range of motion. Neck supple. No JVD present.  Cardiovascular: Normal rate, S1 normal, S2 normal and intact distal pulses.  An irregularly irregular rhythm present. Exam reveals no  gallop and no friction rub.   Murmur heard.  Crescendo systolic murmur is present with a grade of 1/6  Pulmonary/Chest: Effort normal and breath sounds normal. No respiratory distress. She has no wheezes. She has no rales. She exhibits no tenderness.  Abdominal: Soft. Bowel sounds are normal. She exhibits no distension. There is no tenderness.  Musculoskeletal: Normal range of motion. She exhibits no edema and no tenderness.  Lymphadenopathy:    She has no cervical adenopathy.  Neurological: She is alert and oriented to person, place, and time. Coordination normal.  Skin: Skin is warm and dry. No rash noted. No erythema.       Right LE is wrapped  Psychiatric: She has a normal mood and affect. Her behavior is normal. Judgment and thought content normal.         Assessment and Plan

## 2012-12-01 NOTE — Assessment & Plan Note (Signed)
Weight is up several pounds. Edema has improved on diuretic every other day with Maxide daily. We have suggested she monitor her weight, take extra torsemide as needed for edema or shortness of breath.

## 2012-12-02 ENCOUNTER — Other Ambulatory Visit: Payer: Self-pay | Admitting: *Deleted

## 2012-12-02 DIAGNOSIS — L97809 Non-pressure chronic ulcer of other part of unspecified lower leg with unspecified severity: Secondary | ICD-10-CM

## 2012-12-02 DIAGNOSIS — I739 Peripheral vascular disease, unspecified: Secondary | ICD-10-CM

## 2012-12-18 ENCOUNTER — Encounter: Payer: Self-pay | Admitting: Nurse Practitioner

## 2012-12-18 ENCOUNTER — Encounter: Payer: Self-pay | Admitting: Cardiothoracic Surgery

## 2012-12-22 ENCOUNTER — Ambulatory Visit (INDEPENDENT_AMBULATORY_CARE_PROVIDER_SITE_OTHER): Payer: Medicare Other | Admitting: *Deleted

## 2012-12-22 DIAGNOSIS — I359 Nonrheumatic aortic valve disorder, unspecified: Secondary | ICD-10-CM

## 2012-12-22 DIAGNOSIS — I4891 Unspecified atrial fibrillation: Secondary | ICD-10-CM

## 2012-12-22 DIAGNOSIS — G459 Transient cerebral ischemic attack, unspecified: Secondary | ICD-10-CM

## 2012-12-22 LAB — POCT INR: INR: 2.1

## 2012-12-24 ENCOUNTER — Ambulatory Visit (INDEPENDENT_AMBULATORY_CARE_PROVIDER_SITE_OTHER): Payer: Medicare Other | Admitting: *Deleted

## 2012-12-24 DIAGNOSIS — I4891 Unspecified atrial fibrillation: Secondary | ICD-10-CM

## 2012-12-24 DIAGNOSIS — I5032 Chronic diastolic (congestive) heart failure: Secondary | ICD-10-CM

## 2012-12-24 LAB — PACEMAKER DEVICE OBSERVATION
BRDY-0002RV: 60 {beats}/min
BRDY-0004RV: 130 {beats}/min
RV LEAD AMPLITUDE: 15.67 mv
RV LEAD THRESHOLD: 1 V

## 2012-12-24 NOTE — Progress Notes (Signed)
PPM check 

## 2012-12-27 ENCOUNTER — Encounter: Payer: Self-pay | Admitting: Vascular Surgery

## 2012-12-28 ENCOUNTER — Ambulatory Visit (INDEPENDENT_AMBULATORY_CARE_PROVIDER_SITE_OTHER): Payer: Medicare Other | Admitting: Vascular Surgery

## 2012-12-28 ENCOUNTER — Encounter (INDEPENDENT_AMBULATORY_CARE_PROVIDER_SITE_OTHER): Payer: Medicare Other | Admitting: *Deleted

## 2012-12-28 ENCOUNTER — Encounter: Payer: Self-pay | Admitting: Vascular Surgery

## 2012-12-28 ENCOUNTER — Encounter: Payer: Medicare Other | Admitting: Vascular Surgery

## 2012-12-28 VITALS — BP 153/65 | HR 83 | Resp 18 | Ht 63.0 in | Wt 147.0 lb

## 2012-12-28 DIAGNOSIS — L97809 Non-pressure chronic ulcer of other part of unspecified lower leg with unspecified severity: Secondary | ICD-10-CM

## 2012-12-28 DIAGNOSIS — I739 Peripheral vascular disease, unspecified: Secondary | ICD-10-CM

## 2012-12-28 NOTE — Progress Notes (Signed)
The patient has today for evaluation of lower extremities arterial flow. She is known to me from prior evaluation of thoracic aneurysm. She does have extremely fragile skin on both lower extremities and had some skin cancers removed. She's had report very slow healing of these. The left leg has completely healed and there is a small open area of superficial ulceration of the right. She's been treated at the [place] [facility] wound center. She has remained in stable health for her age of 18 and is that she quite active. She has no symptoms referable to her thoracic aneurysm. She was scheduled to see [place] in the next several weeks for a CT scan followup of her thoracic aneurysm  Past Medical History  Diagnosis Date  . Bradycardia     Severe in the setting of permanent atrial fibrillation with tachybrady syndrome s/p single-chamber Medtronic pacemaker placement 08/2007  . Permanent atrial fibrillation   . Tachy-brady syndrome   . S/P placement of cardiac pacemaker   . Brain aneurysm     Hx of  . Hypertension   . Thoracic aneurysm   . AAA (abdominal aortic aneurysm)     S/P resection  . S/P aortic valve replacement with bioprosthetic valve     In the [facility] prior to moving here  . Seizures   . Diastolic heart failure   . Diverticulosis   . Hemorrhoids   . Gastritis   . Hepatitis B 1980's    History  Substance Use Topics  . Smoking status: Never Smoker   . Smokeless tobacco: Not on file  . Alcohol Use: No    History reviewed. No pertinent family history.  Allergies  Allergen Reactions  . Fluticasone-Salmeterol   . Furosemide   . Iodine   . Iohexol      Desc: patient requires a 13 hr prep, when doing exam with contrast,,,,,fever; chills; dyspnea(slg/04/27/09--given 2 hour solumedrol/ benadryl prep--no reaction)   . Latex   . Penicillins   . Sulfonamide Derivatives   . Verapamil     Current outpatient prescriptions:atenolol (TENORMIN) 100 MG tablet, Take 1 tablet (100 mg total)  by mouth daily. Take an extra 1/2 tablet for fast heart beat., Disp: 45 tablet, Rfl: 6;  LORazepam (ATIVAN) 0.5 MG tablet, Take 0.5 mg by mouth 3 (three) times daily as needed.  , Disp: , Rfl: ;  Multiple Vitamins-Minerals (EYE VITAMINS) CAPS, Take by mouth daily.  , Disp: , Rfl:  neomycin-bacitracin-polymyxin (NEOSPORIN) ointment, Apply topically as needed.  , Disp: , Rfl: ;  omeprazole (PRILOSEC) 40 MG capsule, Take 40 mg by mouth 2 (two) times daily.  , Disp: , Rfl: ;  potassium chloride (K-DUR) 10 MEQ tablet, Take 1 tablet (10 mEq total) by mouth daily., Disp: 90 tablet, Rfl: 3;  torsemide (DEMADEX) 20 MG tablet, Take 0.5 tablets (10 mg total) by mouth daily. As needed., Disp: 15 tablet, Rfl: 1 traZODone (DESYREL) 50 MG tablet, Take 50 mg by mouth at bedtime. , Disp: , Rfl: ;  triamterene-hydrochlorothiazide (MAXZIDE-25) 37.5-25 MG per tablet, Take 1 tablet by mouth daily., Disp: , Rfl: ;  warfarin (COUMADIN) 2.5 MG tablet, Take 2.5 mg by mouth. TAKE 2.5 MG AS DIRECTED ON Tuesday, Wednesday, Thursday AND Sunday., Disp: , Rfl: ;  warfarin (COUMADIN) 5 MG tablet, Take 1 tablet (5 mg total) by mouth as directed., Disp: 45 tablet, Rfl: 3  BP 153/65  Pulse 83  Resp 18  Ht 5\' 3"  (1.6 m)  Wt 147 lb (66.679 kg)  BMI 26.05 kg/m2  Body mass index is 26.05 kg/(m^2).  Physical exam: Well-developed well-nourished female in no acute distress She is grossly intact neurologically Respirations are nonlabored Pulse status 2+ radial 2+ femoral pulses. I do not feel popliteal or distal pulses bilaterally Skin has a very fragile appearance of her pretibial areas bilaterally. There are no open ulcers on the left and she does have a very superficial ulceration over her right pretibial area  She underwent noninvasive vascular laboratory studies in our office and this reveals ankle arm index of 0.68 on the right and 0.78 on the left. She is monophasic distal waveforms. 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 She has been able to heal her left superficial ulceration and as is slow but steady progress on the right. I had a long discussion with the patient and family present. I would recommend continued observation only and would not recommend arteriogram. I explained that she does have any deterioration in her wound that we would recommend arteriography and possible revascularization. She will continue her followup at the wound center.  Regarding her thoracic aneurysm. I again a long discussion with the patient and her family present. I explained that her advanced age and her prior extensive arch or repair that I would to completely comfortable not following her thoracic aneurysm. She would have an extremely high perioperative morbidity and would be unlikely that surgery would be recommended even if she had progression in size. She is completely comfortable with this discussion and we will discontinue followup of her thoracic aneurysm. She will see [place] should she have any progressive tissue loss in her legs

## 2013-01-01 ENCOUNTER — Other Ambulatory Visit: Payer: Self-pay

## 2013-01-07 ENCOUNTER — Other Ambulatory Visit: Payer: Self-pay

## 2013-01-07 MED ORDER — ATENOLOL 100 MG PO TABS: 100.0000 mg | ORAL_TABLET | Freq: Every day | ORAL | Status: DC

## 2013-01-07 NOTE — Telephone Encounter (Signed)
Refill sent for atenolol.  

## 2013-01-11 ENCOUNTER — Other Ambulatory Visit: Payer: Medicare Other

## 2013-01-11 ENCOUNTER — Ambulatory Visit: Payer: Medicare Other | Admitting: Vascular Surgery

## 2013-01-12 ENCOUNTER — Ambulatory Visit (INDEPENDENT_AMBULATORY_CARE_PROVIDER_SITE_OTHER): Payer: Medicare Other

## 2013-01-12 DIAGNOSIS — G459 Transient cerebral ischemic attack, unspecified: Secondary | ICD-10-CM

## 2013-01-12 LAB — POCT INR: INR: 2.5

## 2013-01-14 ENCOUNTER — Encounter: Payer: Self-pay | Admitting: Internal Medicine

## 2013-01-18 ENCOUNTER — Ambulatory Visit: Payer: Medicare Other | Admitting: Vascular Surgery

## 2013-01-18 ENCOUNTER — Other Ambulatory Visit: Payer: Medicare Other

## 2013-02-02 ENCOUNTER — Ambulatory Visit (INDEPENDENT_AMBULATORY_CARE_PROVIDER_SITE_OTHER): Payer: Medicare Other

## 2013-02-02 DIAGNOSIS — I4891 Unspecified atrial fibrillation: Secondary | ICD-10-CM

## 2013-02-02 DIAGNOSIS — I359 Nonrheumatic aortic valve disorder, unspecified: Secondary | ICD-10-CM

## 2013-02-02 DIAGNOSIS — G459 Transient cerebral ischemic attack, unspecified: Secondary | ICD-10-CM

## 2013-02-02 LAB — POCT INR: INR: 2.3

## 2013-02-09 ENCOUNTER — Other Ambulatory Visit: Payer: Self-pay

## 2013-02-09 ENCOUNTER — Telehealth: Payer: Self-pay

## 2013-02-09 DIAGNOSIS — R0602 Shortness of breath: Secondary | ICD-10-CM

## 2013-02-09 DIAGNOSIS — R609 Edema, unspecified: Secondary | ICD-10-CM

## 2013-02-09 NOTE — Telephone Encounter (Signed)
"  Schedule pt for echocardiogram for edema and worsening sob" VO Dr. Alvis Lemmings, RN

## 2013-02-10 NOTE — Telephone Encounter (Signed)
Pt scheduled for echo 4/1. Pt aware

## 2013-02-15 ENCOUNTER — Other Ambulatory Visit: Payer: Self-pay

## 2013-02-15 ENCOUNTER — Other Ambulatory Visit (INDEPENDENT_AMBULATORY_CARE_PROVIDER_SITE_OTHER): Payer: Medicare Other

## 2013-02-15 DIAGNOSIS — R609 Edema, unspecified: Secondary | ICD-10-CM

## 2013-02-15 DIAGNOSIS — R0602 Shortness of breath: Secondary | ICD-10-CM

## 2013-02-25 ENCOUNTER — Other Ambulatory Visit: Payer: Self-pay

## 2013-02-25 MED ORDER — TORSEMIDE 20 MG PO TABS: 20.0000 mg | ORAL_TABLET | Freq: Every day | ORAL | Status: DC

## 2013-03-02 ENCOUNTER — Telehealth: Payer: Self-pay

## 2013-03-02 NOTE — Telephone Encounter (Signed)
Pt's niece called in pt is sick.  Took a 7 day course of Nitrofurantoin, completed yesterday.  Started on Medrol dose pak yesterday and starting on Levaquin today 500mg  QD x 7 days.  Cancelled appt for 03/02/13 d/t illness rescheduled for 03/04/13 to evaluate effects of abx and steroids on INR.

## 2013-03-04 ENCOUNTER — Ambulatory Visit (INDEPENDENT_AMBULATORY_CARE_PROVIDER_SITE_OTHER): Payer: Medicare Other

## 2013-03-04 DIAGNOSIS — I359 Nonrheumatic aortic valve disorder, unspecified: Secondary | ICD-10-CM

## 2013-03-04 DIAGNOSIS — G459 Transient cerebral ischemic attack, unspecified: Secondary | ICD-10-CM

## 2013-03-04 DIAGNOSIS — I4891 Unspecified atrial fibrillation: Secondary | ICD-10-CM

## 2013-03-09 ENCOUNTER — Ambulatory Visit (INDEPENDENT_AMBULATORY_CARE_PROVIDER_SITE_OTHER): Payer: Medicare Other

## 2013-03-09 DIAGNOSIS — G459 Transient cerebral ischemic attack, unspecified: Secondary | ICD-10-CM

## 2013-03-09 DIAGNOSIS — I359 Nonrheumatic aortic valve disorder, unspecified: Secondary | ICD-10-CM

## 2013-03-09 DIAGNOSIS — I4891 Unspecified atrial fibrillation: Secondary | ICD-10-CM

## 2013-03-23 ENCOUNTER — Ambulatory Visit (INDEPENDENT_AMBULATORY_CARE_PROVIDER_SITE_OTHER): Payer: Medicare Other

## 2013-03-23 DIAGNOSIS — G459 Transient cerebral ischemic attack, unspecified: Secondary | ICD-10-CM

## 2013-03-23 DIAGNOSIS — I359 Nonrheumatic aortic valve disorder, unspecified: Secondary | ICD-10-CM

## 2013-03-23 DIAGNOSIS — I4891 Unspecified atrial fibrillation: Secondary | ICD-10-CM

## 2013-04-13 ENCOUNTER — Ambulatory Visit (INDEPENDENT_AMBULATORY_CARE_PROVIDER_SITE_OTHER): Payer: Medicare Other

## 2013-04-13 DIAGNOSIS — I4891 Unspecified atrial fibrillation: Secondary | ICD-10-CM

## 2013-04-13 DIAGNOSIS — I359 Nonrheumatic aortic valve disorder, unspecified: Secondary | ICD-10-CM

## 2013-04-13 DIAGNOSIS — G459 Transient cerebral ischemic attack, unspecified: Secondary | ICD-10-CM

## 2013-04-13 LAB — POCT INR: INR: 2.1

## 2013-04-27 ENCOUNTER — Ambulatory Visit (INDEPENDENT_AMBULATORY_CARE_PROVIDER_SITE_OTHER): Payer: Medicare Other

## 2013-04-27 DIAGNOSIS — I4891 Unspecified atrial fibrillation: Secondary | ICD-10-CM

## 2013-04-27 DIAGNOSIS — I359 Nonrheumatic aortic valve disorder, unspecified: Secondary | ICD-10-CM

## 2013-04-27 DIAGNOSIS — G459 Transient cerebral ischemic attack, unspecified: Secondary | ICD-10-CM

## 2013-04-27 LAB — POCT INR: INR: 2.4

## 2013-05-03 ENCOUNTER — Other Ambulatory Visit: Payer: Self-pay

## 2013-05-03 MED ORDER — WARFARIN SODIUM 5 MG PO TABS: ORAL_TABLET | ORAL | Status: DC

## 2013-05-25 ENCOUNTER — Ambulatory Visit (INDEPENDENT_AMBULATORY_CARE_PROVIDER_SITE_OTHER): Payer: Medicare Other

## 2013-05-25 DIAGNOSIS — G459 Transient cerebral ischemic attack, unspecified: Secondary | ICD-10-CM

## 2013-05-25 DIAGNOSIS — I4891 Unspecified atrial fibrillation: Secondary | ICD-10-CM

## 2013-05-25 DIAGNOSIS — I359 Nonrheumatic aortic valve disorder, unspecified: Secondary | ICD-10-CM

## 2013-05-25 LAB — POCT INR: INR: 2.4

## 2013-05-30 ENCOUNTER — Encounter: Payer: Self-pay | Admitting: Cardiovascular Disease

## 2013-05-30 ENCOUNTER — Ambulatory Visit (INDEPENDENT_AMBULATORY_CARE_PROVIDER_SITE_OTHER): Payer: Medicare Other | Admitting: Cardiovascular Disease

## 2013-05-30 ENCOUNTER — Ambulatory Visit: Payer: Medicare Other | Admitting: Cardiovascular Disease

## 2013-05-30 VITALS — BP 112/78 | HR 64 | Ht 63.0 in | Wt 146.0 lb

## 2013-05-30 DIAGNOSIS — I714 Abdominal aortic aneurysm, without rupture, unspecified: Secondary | ICD-10-CM

## 2013-05-30 DIAGNOSIS — I4891 Unspecified atrial fibrillation: Secondary | ICD-10-CM

## 2013-05-30 DIAGNOSIS — I359 Nonrheumatic aortic valve disorder, unspecified: Secondary | ICD-10-CM

## 2013-05-30 DIAGNOSIS — E785 Hyperlipidemia, unspecified: Secondary | ICD-10-CM

## 2013-05-30 DIAGNOSIS — R0602 Shortness of breath: Secondary | ICD-10-CM

## 2013-05-30 DIAGNOSIS — I739 Peripheral vascular disease, unspecified: Secondary | ICD-10-CM

## 2013-05-30 DIAGNOSIS — I5032 Chronic diastolic (congestive) heart failure: Secondary | ICD-10-CM

## 2013-05-30 NOTE — Assessment & Plan Note (Signed)
Chronic atrial fibrillation, on warfarin. No problems with bleeding

## 2013-05-30 NOTE — Assessment & Plan Note (Signed)
Moderate pulmonary hypertension on previous echocardiogram. Encouraged her to be more liberal with her torsemide for any edema or shortness of breath.

## 2013-05-30 NOTE — Assessment & Plan Note (Signed)
Currently not on a statin. We'll discuss with her on her next visit

## 2013-05-30 NOTE — Assessment & Plan Note (Signed)
Bilateral lower extremity arterial disease. She does not walk very far at baseline, uses a walker. No significant claudication symptoms.

## 2013-05-30 NOTE — Progress Notes (Signed)
Patient ID: [name], female    DOB: [birth year], 79 y.o.   MRN: [id number]  HPI Comments: 79 yo WF with history of tachy-brady syndrome s/p pacemaker, Permanent atrial fibrillation on warfarin, HTN, AVR, AAA with repair 4-5 years ago, and chronic diastolic heart failure,  right brachial arterial stenosis, With TIA in 2012,  presenting for routine followup. No significant coronary artery disease or carotid arterial disease Echocardiogram in 2012 showing moderate MR and TR with moderate pulmonary hypertension She has significant peripheral vascular disease, followed by Dr. Arbie Cookey. Does not seem to walk far enough to get claudication symptoms.  admitted to the hospital at [facility] on Apr 16 2011 with shortness of breath, acute on chronic diastolic heart failure, underlying hypertension.  possible  pneumonia. White blood cell count was 13. Antibiotics were discontinued after her chest x-ray cleared.  history of significant lower extremity edema, improved with diuresis and avoiding calcium channel blockers  Digoxin was held in the past.  Numerous skin cancers over the past several years, on her legs, face, arms requiring resection She takes torsemide as needed. She takes Maxzide daily. Today she reports edema has been well-controlled.  Shortness of breath seems to wax and wane, in general is better. She's having cortisone shots to her knees. Significant knee pain bilaterally  cardiac catheterization over 5 years ago showing no significant coronary artery disease, recent CT scan showing stable descending aorta and after repair, minimal dilatation of the descending aorta, stable-appearing aortic valve.   EKG shows paced rhythm , rare PVC   Outpatient Encounter Prescriptions as of 05/30/2013  Medication Sig Dispense Refill  . atenolol (TENORMIN) 100 MG tablet Take 1 tablet (100 mg total) by mouth daily. Take an extra 1/2 tablet for fast heart beat.  45 tablet  6  . LORazepam (ATIVAN) 0.5 MG tablet  Take 0.5 mg by mouth 3 (three) times daily as needed.        . Multiple Vitamins-Minerals (EYE VITAMINS) CAPS Take by mouth daily.        [name] neomycin-bacitracin-polymyxin (NEOSPORIN) ointment Apply topically as needed.        [name] omeprazole (PRILOSEC) 40 MG capsule Take 40 mg by mouth 2 (two) times daily.        . potassium chloride (K-DUR) 10 MEQ tablet Take 1 tablet (10 mEq total) by mouth daily.  90 tablet  3  . torsemide (DEMADEX) 20 MG tablet Take 1 tablet (20 mg total) by mouth daily as needed  15 tablet  1  . traZODone (DESYREL) 50 MG tablet Take 50 mg by mouth at bedtime.       . triamterene-hydrochlorothiazide (MAXZIDE-25) 37.5-25 MG per tablet Take 1 tablet by mouth daily.      [name] warfarin (COUMADIN) 2.5 MG tablet Take 2.5 mg by mouth. TAKE 2.5 MG AS DIRECTED ON Tuesday, Wednesday, Thursday AND Sunday.      . warfarin (COUMADIN) 5 MG tablet Take as directed by anticoagulation clinic  45 tablet  3    Review of Systems  Constitutional: Negative.   HENT: Negative.   Eyes: Negative.   Respiratory: Negative.   Cardiovascular: Negative.   Gastrointestinal: Negative.   Musculoskeletal: Positive for arthralgias and gait problem.  Skin: Negative.   Neurological: Negative.   Psychiatric/Behavioral: Negative.   All other systems reviewed and are negative.    BP 112/78  Pulse 64  Ht 5\' 3"  (1.6 m)  Wt 146 lb (66.225 kg)  BMI 25.87 kg/m2  Physical Exam  Nursing  note and vitals reviewed. Constitutional: She is oriented to person, place, and time. She appears well-developed and well-nourished.  HENT:  Head: Normocephalic.  Nose: Nose normal.  Mouth/Throat: Oropharynx is clear and moist.  Eyes: Conjunctivae are normal. Pupils are equal, round, and reactive to light.  Neck: Normal range of motion. Neck supple. No JVD present.  Cardiovascular: Normal rate, S1 normal, S2 normal and intact distal pulses.  An irregularly irregular rhythm present. Exam reveals no gallop and no friction rub.    Murmur heard.  Crescendo systolic murmur is present with a grade of 1/6  Pulmonary/Chest: Effort normal and breath sounds normal. No respiratory distress. She has no wheezes. She has no rales. She exhibits no tenderness.  Abdominal: Soft. Bowel sounds are normal. She exhibits no distension. There is no tenderness.  Musculoskeletal: Normal range of motion. She exhibits no edema and no tenderness.  Lymphadenopathy:    She has no cervical adenopathy.  Neurological: She is alert and oriented to person, place, and time. Coordination normal.  Skin: Skin is warm and dry. No rash noted. No erythema.  Right LE is wrapped  Psychiatric: She has a normal mood and affect. Her behavior is normal. Judgment and thought content normal.    Assessment and Plan

## 2013-05-30 NOTE — Assessment & Plan Note (Signed)
History of repair. Followup with Dr. early

## 2013-05-30 NOTE — Patient Instructions (Addendum)
You are doing well. No medication changes were made.  Please call us if you have new issues that need to be addressed before your next appt.  Your physician wants you to follow-up in: 6 months.  You will receive a reminder letter in the mail two months in advance. If you don't receive a letter, please call our office to schedule the follow-up appointment.   

## 2013-05-30 NOTE — Assessment & Plan Note (Signed)
Recent ultrasound April of 2014 showing well seated valve. Repeat next year

## 2013-06-22 ENCOUNTER — Other Ambulatory Visit: Payer: Self-pay

## 2013-06-22 ENCOUNTER — Ambulatory Visit (INDEPENDENT_AMBULATORY_CARE_PROVIDER_SITE_OTHER): Payer: Medicare Other

## 2013-06-22 DIAGNOSIS — I4891 Unspecified atrial fibrillation: Secondary | ICD-10-CM

## 2013-06-22 DIAGNOSIS — I359 Nonrheumatic aortic valve disorder, unspecified: Secondary | ICD-10-CM

## 2013-06-22 DIAGNOSIS — G459 Transient cerebral ischemic attack, unspecified: Secondary | ICD-10-CM

## 2013-07-07 ENCOUNTER — Ambulatory Visit (INDEPENDENT_AMBULATORY_CARE_PROVIDER_SITE_OTHER): Payer: Medicare Other | Admitting: Internal Medicine

## 2013-07-07 ENCOUNTER — Encounter: Payer: Self-pay | Admitting: Internal Medicine

## 2013-07-07 VITALS — BP 142/77 | HR 64 | Ht 63.0 in | Wt 150.5 lb

## 2013-07-07 DIAGNOSIS — I359 Nonrheumatic aortic valve disorder, unspecified: Secondary | ICD-10-CM

## 2013-07-07 DIAGNOSIS — R001 Bradycardia, unspecified: Secondary | ICD-10-CM

## 2013-07-07 DIAGNOSIS — Z950 Presence of cardiac pacemaker: Secondary | ICD-10-CM

## 2013-07-07 DIAGNOSIS — I5032 Chronic diastolic (congestive) heart failure: Secondary | ICD-10-CM

## 2013-07-07 DIAGNOSIS — I4891 Unspecified atrial fibrillation: Secondary | ICD-10-CM

## 2013-07-07 DIAGNOSIS — I498 Other specified cardiac arrhythmias: Secondary | ICD-10-CM

## 2013-07-07 DIAGNOSIS — I209 Angina pectoris, unspecified: Secondary | ICD-10-CM

## 2013-07-07 DIAGNOSIS — I208 Other forms of angina pectoris: Secondary | ICD-10-CM

## 2013-07-07 LAB — PACEMAKER DEVICE OBSERVATION
BATTERY VOLTAGE: 2.74 V
BMOD-0005RV: 95 {beats}/min
BRDY-0002RV: 60 {beats}/min
DEVICE MODEL PM: 222626
RV LEAD IMPEDENCE PM: 640 Ohm
RV LEAD THRESHOLD: 1 V

## 2013-07-07 NOTE — Patient Instructions (Addendum)
Your physician wants you to follow-up in: 6 months Glory Rosebush Nurse/ 12 months Dr.Klein. You will receive a reminder letter in the mail two months in advance. If you don't receive a letter, please call our office to schedule the follow-up appointment.  Your physician has requested that you have a lexiscan myoview. For further information please visit [email or web address] Please follow instruction sheet, as given.

## 2013-07-07 NOTE — Assessment & Plan Note (Signed)
The patient's device was interrogated.  The information was reviewed. No changes were made in the programming.    

## 2013-07-07 NOTE — Progress Notes (Signed)
Patient Care Team: Sherron Monday, MD as PCP - General (Internal Medicine) Antonieta Iba, MD (Cardiology) Marcine Matar, MD as Attending Physician (Urology) Nestor Lewandowsky, MD (Orthopedic Surgery)   HPI  [name] is a 79 y.o. female Seen in followup for permanent pacer implanted in the setting of permanent atrial fibrillation and bradycardia. She has prior TIA with her INR target 2.5-30   She complains of increasing exercise intolerance manifested by shortness of breath as well as a chest tightness that radiates up into her neck. It is relieved by rest. She has a history of a remote catheterization in [place]; there is also a stress test that was done more than 6 years ago. She has no known coronary disease    She also has a History of HTN, AVR, AAA  Previously repaired , and chronic diastolic heart failure which she manages with prn toresimide added to her Maxzide.   Echocardiogram in February 2012 showed ejection fraction 50-55%, pulmonary pressures 46 mm of mercury; mild aortic stenosis with intact aortic valve, bioprosthetic valve, otherwise normal study.    The patient denies  , nocturnal dyspnea, orthopnea or peripheral edema. There have been no palpitations, lightheadedness or syncope.   Outside labs reveiwed    Past Medical History  Diagnosis Date  . Bradycardia     Severe in the setting of permanent atrial fibrillation with tachybrady syndrome s/p single-chamber Medtronic pacemaker placement 08/2007  . Permanent atrial fibrillation   . Tachy-brady syndrome   . S/P placement of cardiac pacemaker   . Brain aneurysm     Hx of  . Hypertension   . Thoracic aneurysm   . AAA (abdominal aortic aneurysm)     S/P resection  . S/P aortic valve replacement with bioprosthetic valve     In the [facility] prior to moving here  . Seizures   . Diastolic heart failure   . Diverticulosis   . Hemorrhoids   . Gastritis   . Hepatitis B 1980's  . Skin cancer    face, ear  and left hand.    Past Surgical History  Procedure Laterality Date  . Cerebral aneurysm repair    . Abdominal aortic aneurysm repair      Resection  . Aortic valve replacement      With bioprosthetic valve  . Pacemaker placement  08/2007    Medtronic; AF permanent; s/p pacemaker for bradycardia with now well controlled ventricular response; Digoxin therapy, question level; Lower extremity venous issues  . Breast biopsy    . Cholecystectomy    . Vesicovaginal fistula closure w/ tah    . Appendectomy    . Lung biopsy  2005  . Skin cancer removal      ear, face and hand    Current Outpatient Prescriptions  Medication Sig Dispense Refill  . atenolol (TENORMIN) 100 MG tablet Take 1 tablet (100 mg total) by mouth daily. Take an extra 1/2 tablet for fast heart beat.  45 tablet  6  . LORazepam (ATIVAN) 0.5 MG tablet Take 0.5 mg by mouth 3 (three) times daily as needed.        . Multiple Vitamins-Minerals (EYE VITAMINS) CAPS Take by mouth daily.        [name] neomycin-bacitracin-polymyxin (NEOSPORIN) ointment Apply topically as needed.        [name] omeprazole (PRILOSEC) 40 MG capsule Take 40 mg by mouth 2 (two) times daily.        . potassium chloride (K-DUR) 10 MEQ  tablet Take 1 tablet (10 mEq total) by mouth daily.  90 tablet  3  . torsemide (DEMADEX) 20 MG tablet Take 1 tablet (20 mg total) by mouth daily.  15 tablet  1  . traZODone (DESYREL) 50 MG tablet Take 50 mg by mouth at bedtime.       . triamterene-hydrochlorothiazide (MAXZIDE-25) 37.5-25 MG per tablet Take 1 tablet by mouth daily.      [name] warfarin (COUMADIN) 2.5 MG tablet Take 2.5 mg by mouth. TAKE 2.5 MG AS DIRECTED ON Tuesday, Wednesday, Thursday AND Sunday.      . warfarin (COUMADIN) 5 MG tablet Take as directed by anticoagulation clinic  45 tablet  3   No current facility-administered medications for this visit.    Allergies  Allergen Reactions  . Fluticasone-Salmeterol   . Furosemide   . Iodine   . Iohexol       Desc: patient requires a 13 hr prep, when doing exam with contrast,,,,,fever; chills; dyspnea(slg/04/27/09--given 2 hour solumedrol/ benadryl prep--no reaction)   . Latex   . Penicillins   . Sulfonamide Derivatives   . Verapamil     Review of Systems negative except from HPI and PMH  Physical Exam BP 142/77  Pulse 64  Ht 5\' 3"  (1.6 m)  Wt 150 lb 8 oz (68.266 kg)  BMI 26.67 kg/m2 Well developed and well nourished in no acute distress HENT normal E scleral and icterus clear Neck Supple JVP flat; carotids brisk and full Clear to ausculation  Regular rate and rhythm, no murmurs gallops or rub Soft with active bowel sounds No clubbing cyanosis nonethis Edema Alert and oriented, grossly normal motor and sensory function Skin Warm and Dry    Assessment and  Plan

## 2013-07-07 NOTE — Assessment & Plan Note (Signed)
80% ventricular paced

## 2013-07-07 NOTE — Assessment & Plan Note (Signed)
The patient has progressive exertional chest discomfort consistent with angina. It is accompanied by dyspnea. We'll undertake a Myoview scan.

## 2013-07-07 NOTE — Assessment & Plan Note (Signed)
She has permanent atrial fibrillation. She has an aortic bioprosthesis. I have suggested that they consider the the use of apixaban as opposed to warfarin. Waiting until we have information regarding her coronary disease is reasonable as stenting would be most appropriately pair  antiplatelet agent with warfarin

## 2013-07-07 NOTE — Assessment & Plan Note (Signed)
euvolemic 

## 2013-07-07 NOTE — Assessment & Plan Note (Signed)
As above.

## 2013-07-13 ENCOUNTER — Ambulatory Visit (INDEPENDENT_AMBULATORY_CARE_PROVIDER_SITE_OTHER): Payer: Medicare Other

## 2013-07-13 DIAGNOSIS — G459 Transient cerebral ischemic attack, unspecified: Secondary | ICD-10-CM

## 2013-07-13 DIAGNOSIS — I359 Nonrheumatic aortic valve disorder, unspecified: Secondary | ICD-10-CM

## 2013-07-13 DIAGNOSIS — I4891 Unspecified atrial fibrillation: Secondary | ICD-10-CM

## 2013-07-13 LAB — POCT INR: INR: 2.7

## 2013-07-20 ENCOUNTER — Ambulatory Visit (HOSPITAL_COMMUNITY): Payer: Medicare Other | Attending: Internal Medicine | Admitting: Radiology

## 2013-07-20 VITALS — BP 153/82 | Ht 63.0 in | Wt 148.0 lb

## 2013-07-20 DIAGNOSIS — R079 Chest pain, unspecified: Secondary | ICD-10-CM | POA: Insufficient documentation

## 2013-07-20 DIAGNOSIS — I208 Other forms of angina pectoris: Secondary | ICD-10-CM

## 2013-07-20 DIAGNOSIS — R0602 Shortness of breath: Secondary | ICD-10-CM | POA: Insufficient documentation

## 2013-07-20 DIAGNOSIS — E663 Overweight: Secondary | ICD-10-CM | POA: Insufficient documentation

## 2013-07-20 DIAGNOSIS — R5381 Other malaise: Secondary | ICD-10-CM | POA: Insufficient documentation

## 2013-07-20 DIAGNOSIS — R002 Palpitations: Secondary | ICD-10-CM | POA: Insufficient documentation

## 2013-07-20 DIAGNOSIS — I4891 Unspecified atrial fibrillation: Secondary | ICD-10-CM

## 2013-07-20 DIAGNOSIS — Z950 Presence of cardiac pacemaker: Secondary | ICD-10-CM | POA: Insufficient documentation

## 2013-07-20 DIAGNOSIS — I10 Essential (primary) hypertension: Secondary | ICD-10-CM | POA: Insufficient documentation

## 2013-07-20 DIAGNOSIS — Z8673 Personal history of transient ischemic attack (TIA), and cerebral infarction without residual deficits: Secondary | ICD-10-CM | POA: Insufficient documentation

## 2013-07-20 DIAGNOSIS — I2510 Atherosclerotic heart disease of native coronary artery without angina pectoris: Secondary | ICD-10-CM

## 2013-07-20 DIAGNOSIS — R0609 Other forms of dyspnea: Secondary | ICD-10-CM | POA: Insufficient documentation

## 2013-07-20 DIAGNOSIS — R0989 Other specified symptoms and signs involving the circulatory and respiratory systems: Secondary | ICD-10-CM | POA: Insufficient documentation

## 2013-07-20 MED ORDER — TECHNETIUM TC 99M SESTAMIBI GENERIC - CARDIOLITE
33.0000 | Freq: Once | INTRAVENOUS | Status: AC | PRN
  Administered 2013-07-20: 33 via INTRAVENOUS

## 2013-07-20 MED ORDER — TECHNETIUM TC 99M SESTAMIBI GENERIC - CARDIOLITE
11.0000 | Freq: Once | INTRAVENOUS | Status: AC | PRN
  Administered 2013-07-20: 11 via INTRAVENOUS

## 2013-07-20 MED ORDER — ADENOSINE (DIAGNOSTIC) 3 MG/ML IV SOLN
0.5600 mg/kg | Freq: Once | INTRAVENOUS | Status: AC
  Administered 2013-07-20: 37.5 mg via INTRAVENOUS

## 2013-07-20 NOTE — Progress Notes (Signed)
[facility] SITE 3 NUCLEAR MED [address] [place], [place] [address] [phone]    Cardiology Nuclear Med Study  [name] is a 79 y.o. female     MRN : [id number]     DOB: 01-24-1922  Procedure Date: 07/20/2013  Nuclear Med Background Indication for Stress Test:  Evaluation for Ischemia History:  2014 EF 50-55%, 2008 Pacemaker, 2006 AVR Cardiac Risk Factors: Hypertension, Overweight and TIA  Symptoms:  Chest Pain, DOE, Fatigue, Palpitations and SOB   Nuclear Pre-Procedure Caffeine/Decaff Intake:  None NPO After: 7:30am   Lungs:  clear O2 Sat: 96% on room air. IV 0.9% NS with Angio Cath:  22g  IV Site: L Antecubital  IV Started by:  Bonnita Levan, RN  Chest Size (in):  36 Cup Size: C  Height: 5\' 3"  (1.6 m)  Weight:  148 lb (67.132 kg)  BMI:  Body mass index is 26.22 kg/(m^2). Tech Comments: N/A    Nuclear Med Study 1 or 2 day study: 1 day  Stress Test Type:  Lexiscan  Reading MD: Kristeen Miss, MD  Order Authorizing Provider:  Berton Mount, MD  Resting Radionuclide: Technetium 7m Sestamibi  Resting Radionuclide Dose: 11.0 mCi   Stress Radionuclide:  Technetium 40m Sestamibi  Stress Radionuclide Dose: 33.0 mCi           Stress Protocol Rest HR: 62 Stress HR: 81  Rest BP: 153/82 Stress BP: 146/86  Exercise Time (min): n/a METS: n/a   Predicted Max HR: 132 bpm % Max HR: 61.36 bpm Rate Pressure Product: 46962   Dose of Adenosine (mg):  37.7 Dose of Lexiscan: n/a mg  Dose of Atropine (mg): n/a Dose of Dobutamine: n/a mcg/kg/min (at max HR)  Stress Test Technologist: Bonnita Levan, RN  Nuclear Technologist:  Domenic Polite, CNMT     Rest Procedure:  Myocardial perfusion imaging was performed at rest 45 minutes following the intravenous administration of Technetium 21m Sestamibi. Rest ECG: V paced  Stress Procedure:  The patient received IV adenosine at 140 mcg/kg/min for 4 minutes.  Technetium 7m Sestamibi was injected at the 2 minute mark and  quantitative spect images were obtained after a 45 minute delay.  Stress ECG: No significant change from baseline ECG  QPS Raw Data Images:  There is interference from nuclear activity from structures below the diaphragm. This does not affect the ability to read the study. Stress Images:  Normal homogeneous uptake in all areas of the myocardium. Rest Images:  Normal homogeneous uptake in all areas of the myocardium. Subtraction (SDS):  No evidence of ischemia. Transient Ischemic Dilatation (Normal <1.22):  n/a Lung/Heart Ratio (Normal <0.45):  0.36  Quantitative Gated Spect Images QGS EDV:  69 ml QGS ESV:  23 ml  Impression Exercise Capacity:  Adenosine study with no exercise. BP Response:  Normal blood pressure response. Clinical Symptoms:  No significant symptoms noted. ECG Impression:  No significant ST segment change suggestive of ischemia. Comparison with Prior Nuclear Study: No previous nuclear study performed  Overall Impression:  Normal stress nuclear study.  No evidence of ischemia.   LV Ejection Fraction: 66%.  LV Wall Motion:  NL LV Function; NL Wall Motion.   Vesta Mixer, Montez Hageman., MD, [facility] 07/20/2013, 5:01 PM Office - [phone] Pager [phone]

## 2013-08-03 ENCOUNTER — Ambulatory Visit (INDEPENDENT_AMBULATORY_CARE_PROVIDER_SITE_OTHER): Payer: Medicare Other

## 2013-08-03 DIAGNOSIS — I359 Nonrheumatic aortic valve disorder, unspecified: Secondary | ICD-10-CM

## 2013-08-03 DIAGNOSIS — G459 Transient cerebral ischemic attack, unspecified: Secondary | ICD-10-CM

## 2013-08-03 DIAGNOSIS — I4891 Unspecified atrial fibrillation: Secondary | ICD-10-CM

## 2013-08-03 LAB — POCT INR: INR: 3.3

## 2013-08-31 ENCOUNTER — Ambulatory Visit (INDEPENDENT_AMBULATORY_CARE_PROVIDER_SITE_OTHER): Payer: Medicare Other | Admitting: General Practice

## 2013-08-31 DIAGNOSIS — I4891 Unspecified atrial fibrillation: Secondary | ICD-10-CM

## 2013-08-31 DIAGNOSIS — I359 Nonrheumatic aortic valve disorder, unspecified: Secondary | ICD-10-CM

## 2013-08-31 DIAGNOSIS — G459 Transient cerebral ischemic attack, unspecified: Secondary | ICD-10-CM

## 2013-08-31 LAB — POCT INR: INR: 4.9

## 2013-09-07 ENCOUNTER — Ambulatory Visit (INDEPENDENT_AMBULATORY_CARE_PROVIDER_SITE_OTHER): Payer: Medicare Other | Admitting: General Practice

## 2013-09-07 DIAGNOSIS — I359 Nonrheumatic aortic valve disorder, unspecified: Secondary | ICD-10-CM

## 2013-09-07 DIAGNOSIS — G459 Transient cerebral ischemic attack, unspecified: Secondary | ICD-10-CM

## 2013-09-07 DIAGNOSIS — I4891 Unspecified atrial fibrillation: Secondary | ICD-10-CM

## 2013-09-21 ENCOUNTER — Ambulatory Visit (INDEPENDENT_AMBULATORY_CARE_PROVIDER_SITE_OTHER): Payer: Medicare Other | Admitting: General Practice

## 2013-09-21 DIAGNOSIS — G459 Transient cerebral ischemic attack, unspecified: Secondary | ICD-10-CM

## 2013-09-21 DIAGNOSIS — I4891 Unspecified atrial fibrillation: Secondary | ICD-10-CM

## 2013-09-21 DIAGNOSIS — I359 Nonrheumatic aortic valve disorder, unspecified: Secondary | ICD-10-CM

## 2013-09-21 LAB — POCT INR: INR: 3.9

## 2013-09-22 ENCOUNTER — Other Ambulatory Visit: Payer: Self-pay

## 2013-10-05 ENCOUNTER — Ambulatory Visit (INDEPENDENT_AMBULATORY_CARE_PROVIDER_SITE_OTHER): Payer: Medicare Other | Admitting: General Practice

## 2013-10-05 DIAGNOSIS — I359 Nonrheumatic aortic valve disorder, unspecified: Secondary | ICD-10-CM

## 2013-10-05 DIAGNOSIS — G459 Transient cerebral ischemic attack, unspecified: Secondary | ICD-10-CM

## 2013-10-05 DIAGNOSIS — I4891 Unspecified atrial fibrillation: Secondary | ICD-10-CM

## 2013-10-05 LAB — POCT INR: INR: 2.3

## 2013-10-26 ENCOUNTER — Ambulatory Visit (INDEPENDENT_AMBULATORY_CARE_PROVIDER_SITE_OTHER): Payer: Medicare Other | Admitting: General Practice

## 2013-10-26 DIAGNOSIS — I4891 Unspecified atrial fibrillation: Secondary | ICD-10-CM

## 2013-10-26 DIAGNOSIS — I359 Nonrheumatic aortic valve disorder, unspecified: Secondary | ICD-10-CM

## 2013-10-26 DIAGNOSIS — G459 Transient cerebral ischemic attack, unspecified: Secondary | ICD-10-CM

## 2013-11-23 ENCOUNTER — Ambulatory Visit (INDEPENDENT_AMBULATORY_CARE_PROVIDER_SITE_OTHER): Payer: Medicare Other

## 2013-11-23 DIAGNOSIS — I359 Nonrheumatic aortic valve disorder, unspecified: Secondary | ICD-10-CM

## 2013-11-23 DIAGNOSIS — I4891 Unspecified atrial fibrillation: Secondary | ICD-10-CM

## 2013-11-23 DIAGNOSIS — G459 Transient cerebral ischemic attack, unspecified: Secondary | ICD-10-CM

## 2013-11-23 LAB — POCT INR: INR: 3.1

## 2013-11-25 ENCOUNTER — Other Ambulatory Visit: Payer: Self-pay

## 2013-11-25 MED ORDER — ATENOLOL 100 MG PO TABS
100.0000 mg | ORAL_TABLET | Freq: Every day | ORAL | Status: DC
Start: 2013-11-25 — End: 2014-07-11

## 2013-11-25 MED ORDER — POTASSIUM CHLORIDE ER 10 MEQ PO TBCR: 10.0000 meq | EXTENDED_RELEASE_TABLET | Freq: Every day | ORAL | Status: DC

## 2013-11-25 NOTE — Telephone Encounter (Signed)
Refill sent for atenolol.  

## 2013-11-25 NOTE — Telephone Encounter (Signed)
Refill sent for potassium ?

## 2013-11-30 ENCOUNTER — Ambulatory Visit: Payer: Medicare Other | Admitting: Cardiovascular Disease

## 2013-12-12 ENCOUNTER — Other Ambulatory Visit: Payer: Self-pay

## 2013-12-12 ENCOUNTER — Telehealth: Payer: Self-pay | Admitting: *Deleted

## 2013-12-12 MED ORDER — TORSEMIDE 20 MG PO TABS: 20.0000 mg | ORAL_TABLET | Freq: Every day | ORAL | Status: DC

## 2013-12-12 NOTE — Telephone Encounter (Signed)
Refill sent for Torsemide 20 mg  

## 2013-12-12 NOTE — Telephone Encounter (Signed)
Spoke with niece.  Pt given a Zpak, Medrol Dose Pak, and and Cherratussin for bronchitis on Friday.  She already has an appt set up to see Dr. Rockey Situ on Thursday.  Will check Coumadin at the same time.

## 2013-12-12 NOTE — Telephone Encounter (Signed)
Patient's niece called and [name] is on an antibiotic, steroid and cough medicine for broncitis. Needs to be advised for her coumadin.

## 2013-12-15 ENCOUNTER — Ambulatory Visit (INDEPENDENT_AMBULATORY_CARE_PROVIDER_SITE_OTHER): Payer: Medicare Other

## 2013-12-15 ENCOUNTER — Ambulatory Visit (INDEPENDENT_AMBULATORY_CARE_PROVIDER_SITE_OTHER): Payer: Medicare Other | Admitting: Cardiovascular Disease

## 2013-12-15 ENCOUNTER — Encounter: Payer: Self-pay | Admitting: Cardiovascular Disease

## 2013-12-15 VITALS — BP 140/64 | HR 67 | Ht 63.0 in | Wt 143.2 lb

## 2013-12-15 DIAGNOSIS — J40 Bronchitis, not specified as acute or chronic: Secondary | ICD-10-CM | POA: Insufficient documentation

## 2013-12-15 DIAGNOSIS — R0602 Shortness of breath: Secondary | ICD-10-CM

## 2013-12-15 DIAGNOSIS — I359 Nonrheumatic aortic valve disorder, unspecified: Secondary | ICD-10-CM

## 2013-12-15 DIAGNOSIS — I5032 Chronic diastolic (congestive) heart failure: Secondary | ICD-10-CM

## 2013-12-15 DIAGNOSIS — G459 Transient cerebral ischemic attack, unspecified: Secondary | ICD-10-CM

## 2013-12-15 DIAGNOSIS — Z5181 Encounter for therapeutic drug level monitoring: Secondary | ICD-10-CM | POA: Insufficient documentation

## 2013-12-15 DIAGNOSIS — I493 Ventricular premature depolarization: Secondary | ICD-10-CM | POA: Insufficient documentation

## 2013-12-15 DIAGNOSIS — I4949 Other premature depolarization: Secondary | ICD-10-CM

## 2013-12-15 DIAGNOSIS — I739 Peripheral vascular disease, unspecified: Secondary | ICD-10-CM

## 2013-12-15 DIAGNOSIS — E785 Hyperlipidemia, unspecified: Secondary | ICD-10-CM

## 2013-12-15 DIAGNOSIS — I4891 Unspecified atrial fibrillation: Secondary | ICD-10-CM

## 2013-12-15 LAB — POCT INR: INR: 3.6

## 2013-12-15 MED ORDER — ALBUTEROL SULFATE HFA 108 (90 BASE) MCG/ACT IN AERS: 2.0000 | INHALATION_SPRAY | Freq: Four times a day (QID) | RESPIRATORY_TRACT | Status: DC | PRN

## 2013-12-15 NOTE — Patient Instructions (Signed)
You are doing well.  Please [place] albuterol as needed for wheezing, shortness of breath If symptoms not getting better by Monday,  Call PMD. You might need Levaquin for 10 to 14 days  Continue torsemide as needed for shortness of breath or weight gain, leg swelling  Please call us if you have new issues that need to be addressed before your next appt.  Your physician wants you to follow-up in: 6 weeks

## 2013-12-15 NOTE — Progress Notes (Signed)
Patient ID: [name], female    DOB: 13-Aug-1922, 80 y.o.   MRN: [id number]  HPI Comments: 80 yo WF with history of tachy-brady syndrome s/p pacemaker, Permanent atrial fibrillation on warfarin, HTN, AVR, AAA with repair 4-5 years ago, and chronic diastolic heart failure,  right brachial arterial stenosis, With TIA in 2012,  presenting for routine followup. No significant coronary artery disease or carotid arterial disease Echocardiogram in 2012 showing moderate MR and TR with moderate pulmonary hypertension She has significant peripheral vascular disease, followed by Dr. Donnetta Hutching.   In followup today, she reports having several months of shortness of breath, worse in the morning, better in the afternoon. Recently diagnosed with bronchitis, completed a Z-Pak, still on prednisone. She still has a very deep sick cough. Reports having x-ray last week that showed no pneumonia. She took several days of diuretics, torsemide,  with mild weight loss now down to 140 pounds. No significant edema. Minimal improvement in her shortness of breath.  admitted to the hospital at [facility] on Apr 16 2011 with shortness of breath, acute on chronic diastolic heart failure, underlying hypertension.  possible  pneumonia. White blood cell count was 13. Antibiotics were discontinued after her chest x-ray cleared.  history of significant lower extremity edema, improved with diuresis and avoiding calcium channel blockers  Digoxin was held in the past.  Numerous skin cancers over the past several years, on her legs, face, arms requiring resection She takes Maxzide daily. Today she reports edema has been well-controlled. History of arthritis in her knees requiring cortisone shot  cardiac catheterization over 5 years ago showing no significant coronary artery disease, recent CT scan showing stable descending aorta and after repair, minimal dilatation of the descending aorta, stable-appearing aortic valve.   EKG shows paced  rhythm , frequent PVCs in a  bigeminal pattern   Outpatient Encounter Prescriptions as of 12/15/2013  Medication Sig  . atenolol (TENORMIN) 100 MG tablet Take 1 tablet (100 mg total) by mouth daily. Take an extra 1/2 tablet for fast heart beat.  [name] guaiFENesin-codeine (CHERATUSSIN AC) 100-10 MG/5ML syrup Take 5 mLs by mouth 3 (three) times daily as needed for cough.  [name] LORazepam (ATIVAN) 0.5 MG tablet Take 0.5 mg by mouth 3 (three) times daily as needed.    . Multiple Vitamins-Minerals (EYE VITAMINS) CAPS Take by mouth daily.    [name] neomycin-bacitracin-polymyxin (NEOSPORIN) ointment Apply topically as needed.    . pantoprazole (PROTONIX) 40 MG tablet Take 40 mg by mouth daily.  . potassium chloride (K-DUR) 10 MEQ tablet Take 1 tablet (10 mEq total) by mouth daily.  [name] torsemide (DEMADEX) 20 MG tablet Take 1 tablet (20 mg total) by mouth daily.  . traZODone (DESYREL) 50 MG tablet Take 50 mg by mouth at bedtime.   . triamterene-hydrochlorothiazide (MAXZIDE-25) 37.5-25 MG per tablet Take 1 tablet by mouth daily.  [name] warfarin (COUMADIN) 2.5 MG tablet Take 2.5 mg by mouth. TAKE 2.5 MG AS DIRECTED ON Tuesday, Wednesday, Thursday AND Sunday.  . warfarin (COUMADIN) 5 MG tablet Take as directed by anticoagulation clinic     Review of Systems  Constitutional: Negative.   HENT: Negative.   Eyes: Negative.   Respiratory: Negative.   Cardiovascular: Negative.   Gastrointestinal: Negative.   Endocrine: Negative.   Musculoskeletal: Positive for arthralgias and gait problem.  Skin: Negative.   Allergic/Immunologic: Negative.   Neurological: Negative.   Hematological: Negative.   Psychiatric/Behavioral: Negative.   All other systems reviewed and are negative.  BP 140/64  Pulse 67  Ht 5\' 3"  (1.6 m)  Wt 143 lb 4 oz (64.978 kg)  BMI 25.38 kg/m2  Physical Exam  Nursing note and vitals reviewed. Constitutional: She is oriented to person, place, and time. She appears well-developed and  well-nourished.  HENT:  Head: Normocephalic.  Nose: Nose normal.  Mouth/Throat: Oropharynx is clear and moist.  Eyes: Conjunctivae are normal. Pupils are equal, round, and reactive to light.  Neck: Normal range of motion. Neck supple. No JVD present.  Cardiovascular: Normal rate, S1 normal, S2 normal and intact distal pulses.  An irregularly irregular rhythm present. Exam reveals no gallop and no friction rub.   Murmur heard.  Crescendo systolic murmur is present with a grade of 1/6  Pulmonary/Chest: Effort normal and breath sounds normal. No respiratory distress. She has no wheezes. She has no rales. She exhibits no tenderness.  Abdominal: Soft. Bowel sounds are normal. She exhibits no distension. There is no tenderness.  Musculoskeletal: Normal range of motion. She exhibits no edema and no tenderness.  Lymphadenopathy:    She has no cervical adenopathy.  Neurological: She is alert and oriented to person, place, and time. Coordination normal.  Skin: Skin is warm and dry. No rash noted. No erythema.  Right LE is wrapped  Psychiatric: She has a normal mood and affect. Her behavior is normal. Judgment and thought content normal.    Assessment and Plan

## 2013-12-15 NOTE — Assessment & Plan Note (Addendum)
Acute bronchitis diagnosis, recent Z-Pak, still on prednisone. Still with significant shortness of breath on today's visit. Recommended she call primary care in several days' time if no improvement in her symptoms. She may need longer course of antibiotics, possibly Levaquin. Albuterol inhaler given today for when necessary use given significant wheezing on exam

## 2013-12-15 NOTE — Assessment & Plan Note (Addendum)
No recent lipid panel available for review. Currently not on a statin

## 2013-12-15 NOTE — Assessment & Plan Note (Signed)
No longer smoking. Does not appear to be on a cholesterol medication. Vascular issues followed in [place]

## 2013-12-15 NOTE — Assessment & Plan Note (Signed)
Recently took several doses of torsemide with decrease in weight, mild improvement in shortness of breath. Appears relatively euvolemic on today's visit. She will take torsemide only when necessary

## 2013-12-15 NOTE — Assessment & Plan Note (Signed)
Unclear of her frequent PVCs are from underlying lung pathology. Suggested he closely monitor her PVC frequency as her lung function improves. She has followup with Dr. Caryl Comes in 2 weeks' time. If ectopy persists, this could potentially explain some of her shortness of breath. Already on a beta blocker.

## 2013-12-26 ENCOUNTER — Ambulatory Visit (INDEPENDENT_AMBULATORY_CARE_PROVIDER_SITE_OTHER): Payer: Medicare Other | Admitting: *Deleted

## 2013-12-26 ENCOUNTER — Ambulatory Visit (INDEPENDENT_AMBULATORY_CARE_PROVIDER_SITE_OTHER): Payer: Medicare Other | Admitting: Pharmacist

## 2013-12-26 DIAGNOSIS — I4891 Unspecified atrial fibrillation: Secondary | ICD-10-CM

## 2013-12-26 DIAGNOSIS — Z5181 Encounter for therapeutic drug level monitoring: Secondary | ICD-10-CM

## 2013-12-26 DIAGNOSIS — G459 Transient cerebral ischemic attack, unspecified: Secondary | ICD-10-CM

## 2013-12-26 DIAGNOSIS — I498 Other specified cardiac arrhythmias: Secondary | ICD-10-CM

## 2013-12-26 DIAGNOSIS — I359 Nonrheumatic aortic valve disorder, unspecified: Secondary | ICD-10-CM

## 2013-12-26 DIAGNOSIS — R001 Bradycardia, unspecified: Secondary | ICD-10-CM

## 2013-12-26 LAB — MDC_IDC_ENUM_SESS_TYPE_INCLINIC
Battery Impedance: 2025 Ohm
Battery Remaining Longevity: 32 mo
Battery Voltage: 2.74 V
Brady Statistic RV Percent Paced: 61 %
Date Time Interrogation Session: 20150209144218
Lead Channel Impedance Value: 0 Ohm
Lead Channel Impedance Value: 628 Ohm
Lead Channel Pacing Threshold Amplitude: 0.75 V
Lead Channel Pacing Threshold Pulse Width: 0.4 ms
Lead Channel Sensing Intrinsic Amplitude: 11.2 mV
Lead Channel Setting Pacing Pulse Width: 0.4 ms
Lead Channel Setting Sensing Sensitivity: 5.6 mV
MDC IDC SET LEADCHNL RV PACING AMPLITUDE: 2 V

## 2013-12-26 LAB — POCT INR: INR: 3.4

## 2013-12-26 NOTE — Progress Notes (Signed)
PPM check in office. 

## 2014-01-06 ENCOUNTER — Ambulatory Visit (INDEPENDENT_AMBULATORY_CARE_PROVIDER_SITE_OTHER): Payer: Medicare Other

## 2014-01-06 DIAGNOSIS — Z5181 Encounter for therapeutic drug level monitoring: Secondary | ICD-10-CM

## 2014-01-06 DIAGNOSIS — I4891 Unspecified atrial fibrillation: Secondary | ICD-10-CM

## 2014-01-06 DIAGNOSIS — G459 Transient cerebral ischemic attack, unspecified: Secondary | ICD-10-CM

## 2014-01-06 DIAGNOSIS — I359 Nonrheumatic aortic valve disorder, unspecified: Secondary | ICD-10-CM

## 2014-01-06 LAB — POCT INR: INR: 2.1

## 2014-01-23 ENCOUNTER — Other Ambulatory Visit: Payer: Self-pay | Admitting: *Deleted

## 2014-01-23 MED ORDER — WARFARIN SODIUM 5 MG PO TABS: ORAL_TABLET | ORAL | Status: DC

## 2014-01-26 ENCOUNTER — Ambulatory Visit (INDEPENDENT_AMBULATORY_CARE_PROVIDER_SITE_OTHER): Payer: Medicare Other | Admitting: Pharmacist

## 2014-01-26 ENCOUNTER — Ambulatory Visit: Payer: Self-pay | Admitting: Pharmacist

## 2014-01-26 ENCOUNTER — Ambulatory Visit (INDEPENDENT_AMBULATORY_CARE_PROVIDER_SITE_OTHER): Payer: Medicare Other | Admitting: Cardiovascular Disease

## 2014-01-26 ENCOUNTER — Encounter: Payer: Self-pay | Admitting: Cardiovascular Disease

## 2014-01-26 VITALS — BP 104/68 | HR 68 | Ht 63.0 in | Wt 150.8 lb

## 2014-01-26 DIAGNOSIS — E785 Hyperlipidemia, unspecified: Secondary | ICD-10-CM

## 2014-01-26 DIAGNOSIS — I272 Pulmonary hypertension, unspecified: Secondary | ICD-10-CM

## 2014-01-26 DIAGNOSIS — I359 Nonrheumatic aortic valve disorder, unspecified: Secondary | ICD-10-CM

## 2014-01-26 DIAGNOSIS — I4891 Unspecified atrial fibrillation: Secondary | ICD-10-CM

## 2014-01-26 DIAGNOSIS — Z5181 Encounter for therapeutic drug level monitoring: Secondary | ICD-10-CM

## 2014-01-26 DIAGNOSIS — R5383 Other fatigue: Secondary | ICD-10-CM

## 2014-01-26 DIAGNOSIS — R5381 Other malaise: Secondary | ICD-10-CM

## 2014-01-26 DIAGNOSIS — G459 Transient cerebral ischemic attack, unspecified: Secondary | ICD-10-CM

## 2014-01-26 DIAGNOSIS — R0602 Shortness of breath: Secondary | ICD-10-CM

## 2014-01-26 DIAGNOSIS — I5032 Chronic diastolic (congestive) heart failure: Secondary | ICD-10-CM

## 2014-01-26 DIAGNOSIS — I2789 Other specified pulmonary heart diseases: Secondary | ICD-10-CM

## 2014-01-26 DIAGNOSIS — R079 Chest pain, unspecified: Secondary | ICD-10-CM

## 2014-01-26 LAB — POCT INR
INR: 2.6
INR: 2.6

## 2014-01-26 NOTE — Assessment & Plan Note (Signed)
Currently not on a statin. No recent lipid panel

## 2014-01-26 NOTE — Patient Instructions (Addendum)
Please continue a full torsemide for now We will check your labs today, BMP  Please call us if you have new issues that need to be addressed before your next appt.  Your physician wants you to follow-up in: 1 month.

## 2014-01-26 NOTE — Assessment & Plan Note (Signed)
Heart rate relatively well controlled. Tolerating warfarin

## 2014-01-26 NOTE — Assessment & Plan Note (Signed)
Valve is well seated and reasonably well functioning on echocardiogram April 2014. Mild regurgitation

## 2014-01-26 NOTE — Assessment & Plan Note (Signed)
Moderate pulmonary hypertension seen on echo April 2014. Suggested she stay on  torsemide daily

## 2014-01-26 NOTE — Progress Notes (Signed)
Patient ID: [name], female    DOB: 1922-02-18, 80 y.o.   MRN: [id number]  HPI Comments: 80 yo WF with history of tachy-brady syndrome s/p pacemaker, Permanent atrial fibrillation on warfarin, HTN, AVR, AAA with repair 4-5 years ago, and chronic diastolic heart failure,  right brachial arterial stenosis, With TIA in 2012,  presenting for routine followup. No significant coronary artery disease or carotid arterial disease Echocardiogram in 2012 showing moderate MR and TR with moderate pulmonary hypertension She has significant peripheral vascular disease, followed by Dr. Donnetta Hutching.   On her last clinic visit, she had severe COPD exacerbation, bronchitis. She was on Z-Pak, prednisone, started on albuterol inhaler She took several days of diuretics, torsemide,  with mild weight loss now down to 140 pounds. No significant edema. Symptoms improved  In followup today, she reports that she has significant fatigue, daughter reports weight gain up to 153 pounds and shortness of breath. She started taking torsemide daily for the past week. Weight  now down to 147 pounds. She still has shortness of breath with fatigue She believes her baseline weight is in the low 140 range. No significant cough or sputum production  admitted to the hospital at [facility] on Apr 16 2011 with shortness of breath, acute on chronic diastolic heart failure, underlying hypertension.  possible  pneumonia. White blood cell count was 13. Antibiotics were discontinued after her chest x-ray cleared.  Digoxin was held in the past.  Numerous skin cancers over the past several years, on her legs, face, arms requiring resection She takes Maxzide daily.  History of arthritis in her knees requiring cortisone shot  cardiac catheterization over 5 years ago showing no significant coronary artery disease, recent CT scan showing stable descending aorta and after repair, minimal dilatation of the descending aorta, stable-appearing aortic valve.    EKG shows paced rhythm , rate 68 beats per minute    Outpatient Encounter Prescriptions as of 01/26/2014  Medication Sig  . albuterol (PROVENTIL HFA;VENTOLIN HFA) 108 (90 BASE) MCG/ACT inhaler Inhale 2 puffs into the lungs every 6 (six) hours as needed for wheezing or shortness of breath.  [name] atenolol (TENORMIN) 100 MG tablet Take 1 tablet (100 mg total) by mouth daily. Take an extra 1/2 tablet for fast heart beat.  [name] guaiFENesin-codeine (CHERATUSSIN AC) 100-10 MG/5ML syrup Take 5 mLs by mouth 3 (three) times daily as needed for cough.  [name] LORazepam (ATIVAN) 0.5 MG tablet Take 0.5 mg by mouth 3 (three) times daily as needed.    . Multiple Vitamins-Minerals (EYE VITAMINS) CAPS Take by mouth daily.    [name] neomycin-bacitracin-polymyxin (NEOSPORIN) ointment Apply topically as needed.    . pantoprazole (PROTONIX) 40 MG tablet Take 40 mg by mouth daily.  . potassium chloride (K-DUR) 10 MEQ tablet Take 1 tablet (10 mEq total) by mouth daily.  [name] torsemide (DEMADEX) 20 MG tablet Take 10 mg by mouth as needed (for an increase in weight of 3 lbs.).  [name] traZODone (DESYREL) 50 MG tablet Take 50 mg by mouth at bedtime.   . triamterene-hydrochlorothiazide (MAXZIDE-25) 37.5-25 MG per tablet Take 1 tablet by mouth daily.  [name] warfarin (COUMADIN) 2.5 MG tablet Take 2.5 mg by mouth. TAKE 2.5 MG AS DIRECTED ON Tuesday, Wednesday, Thursday AND Sunday.  . warfarin (COUMADIN) 5 MG tablet Take as directed by anticoagulation clinic     Review of Systems  Constitutional: Negative.   HENT: Negative.   Eyes: Negative.   Respiratory: Negative.   Cardiovascular: Negative.  Gastrointestinal: Negative.   Endocrine: Negative.   Musculoskeletal: Positive for arthralgias and gait problem.  Skin: Negative.   Allergic/Immunologic: Negative.   Neurological: Negative.   Hematological: Negative.   Psychiatric/Behavioral: Negative.   All other systems reviewed and are negative.    BP 104/68  Pulse 68  Ht 5\' 3"  (1.6 m)   Wt 150 lb 12 oz (68.38 kg)  BMI 26.71 kg/m2  Physical Exam  Nursing note and vitals reviewed. Constitutional: She is oriented to person, place, and time. She appears well-developed and well-nourished.  HENT:  Head: Normocephalic.  Nose: Nose normal.  Mouth/Throat: Oropharynx is clear and moist.  Eyes: Conjunctivae are normal. Pupils are equal, round, and reactive to light.  Neck: Normal range of motion. Neck supple. No JVD present.  Cardiovascular: Normal rate, S1 normal, S2 normal and intact distal pulses.  An irregularly irregular rhythm present. Exam reveals no gallop and no friction rub.   Murmur heard.  Crescendo systolic murmur is present with a grade of 1/6  Pulmonary/Chest: Effort normal and breath sounds normal. No respiratory distress. She has no wheezes. She has no rales. She exhibits no tenderness.  Abdominal: Soft. Bowel sounds are normal. She exhibits no distension. There is no tenderness.  Musculoskeletal: Normal range of motion. She exhibits no edema and no tenderness.  Lymphadenopathy:    She has no cervical adenopathy.  Neurological: She is alert and oriented to person, place, and time. Coordination normal.  Skin: Skin is warm and dry. No rash noted. No erythema.  Right LE is wrapped  Psychiatric: She has a normal mood and affect. Her behavior is normal. Judgment and thought content normal.    Assessment and Plan

## 2014-01-26 NOTE — Assessment & Plan Note (Signed)
She has shortness of breath and fatigued. Weight is up. We have recommended she continue torsemide daily for now. Goal weight in the low 140 range. We will do basic metabolic panel today. If creatinine starts to climb, we'll need to cut the torsemide in half

## 2014-01-27 ENCOUNTER — Telehealth: Payer: Self-pay

## 2014-01-27 LAB — BASIC METABOLIC PANEL
BUN/Creatinine Ratio: 23 (ref 11–26)
BUN: 25 mg/dL (ref 8–27)
CALCIUM: 9.4 mg/dL (ref 8.7–10.3)
CO2: 30 mmol/L — ABNORMAL HIGH (ref 18–29)
CREATININE: 1.07 mg/dL — AB (ref 0.57–1.00)
Chloride: 92 mmol/L — ABNORMAL LOW (ref 97–108)
GFR calc Af Amer: 53 mL/min/{1.73_m2} — ABNORMAL LOW (ref >59)
GFR, EST NON AFRICAN AMERICAN: 46 mL/min/{1.73_m2} — AB (ref >59)
Glucose: 118 mg/dL — ABNORMAL HIGH (ref 65–99)
Potassium: 3.6 mmol/L (ref 3.5–5.2)
SODIUM: 141 mmol/L (ref 134–144)

## 2014-01-27 NOTE — Telephone Encounter (Signed)
Spoke w/ pt's niece, Maudie Mercury, and advised her that Dr. Rockey Situ had reviewed her kidney function and pt is to continue on a full torsemide and her lasix daily to try to get her wt down to about 143. She verbalizes understanding, is agreeable to this and will call w/ further questions or concerns.

## 2014-01-30 ENCOUNTER — Encounter: Payer: Self-pay | Admitting: Internal Medicine

## 2014-02-10 ENCOUNTER — Ambulatory Visit (INDEPENDENT_AMBULATORY_CARE_PROVIDER_SITE_OTHER): Payer: Medicare Other

## 2014-02-10 DIAGNOSIS — I359 Nonrheumatic aortic valve disorder, unspecified: Secondary | ICD-10-CM

## 2014-02-10 DIAGNOSIS — G459 Transient cerebral ischemic attack, unspecified: Secondary | ICD-10-CM

## 2014-02-10 DIAGNOSIS — I4891 Unspecified atrial fibrillation: Secondary | ICD-10-CM

## 2014-02-10 DIAGNOSIS — Z5181 Encounter for therapeutic drug level monitoring: Secondary | ICD-10-CM

## 2014-02-10 LAB — POCT INR: INR: 2.8

## 2014-02-27 ENCOUNTER — Encounter: Payer: Self-pay | Admitting: Cardiovascular Disease

## 2014-02-27 ENCOUNTER — Ambulatory Visit (INDEPENDENT_AMBULATORY_CARE_PROVIDER_SITE_OTHER): Payer: Medicare Other

## 2014-02-27 ENCOUNTER — Ambulatory Visit (INDEPENDENT_AMBULATORY_CARE_PROVIDER_SITE_OTHER): Payer: Medicare Other | Admitting: Cardiovascular Disease

## 2014-02-27 VITALS — BP 140/72 | HR 71 | Ht 63.0 in | Wt 149.0 lb

## 2014-02-27 DIAGNOSIS — G459 Transient cerebral ischemic attack, unspecified: Secondary | ICD-10-CM

## 2014-02-27 DIAGNOSIS — I272 Pulmonary hypertension, unspecified: Secondary | ICD-10-CM

## 2014-02-27 DIAGNOSIS — I4891 Unspecified atrial fibrillation: Secondary | ICD-10-CM

## 2014-02-27 DIAGNOSIS — Z5181 Encounter for therapeutic drug level monitoring: Secondary | ICD-10-CM

## 2014-02-27 DIAGNOSIS — I2789 Other specified pulmonary heart diseases: Secondary | ICD-10-CM

## 2014-02-27 DIAGNOSIS — R5381 Other malaise: Secondary | ICD-10-CM

## 2014-02-27 DIAGNOSIS — I4949 Other premature depolarization: Secondary | ICD-10-CM

## 2014-02-27 DIAGNOSIS — I359 Nonrheumatic aortic valve disorder, unspecified: Secondary | ICD-10-CM

## 2014-02-27 DIAGNOSIS — R5383 Other fatigue: Secondary | ICD-10-CM | POA: Insufficient documentation

## 2014-02-27 DIAGNOSIS — I493 Ventricular premature depolarization: Secondary | ICD-10-CM

## 2014-02-27 DIAGNOSIS — I5032 Chronic diastolic (congestive) heart failure: Secondary | ICD-10-CM

## 2014-02-27 DIAGNOSIS — E785 Hyperlipidemia, unspecified: Secondary | ICD-10-CM

## 2014-02-27 LAB — POCT INR: INR: 3.6

## 2014-02-27 NOTE — Assessment & Plan Note (Signed)
Prior echocardiogram one year ago April 2014 showing mild to moderate AI

## 2014-02-27 NOTE — Assessment & Plan Note (Signed)
Rate relatively stable on torsemide daily. Encouraged her to stay on this as weight has been stable

## 2014-02-27 NOTE — Patient Instructions (Signed)
You are doing well. No medication changes were made.  Please call us if you have new issues that need to be addressed before your next appt.  Your physician wants you to follow-up in: 6 months.  You will receive a reminder letter in the mail two months in advance. If you don't receive a letter, please call our office to schedule the follow-up appointment.   

## 2014-02-27 NOTE — Progress Notes (Signed)
Patient ID: [name], female    DOB: [birth year], 80 y.o.   MRN: [id number]  HPI Comments: 80 yo WF with history of tachy-brady syndrome s/p pacemaker, Permanent atrial fibrillation on warfarin, HTN, AVR, AAA with repair 4-5 years ago, and chronic diastolic heart failure,  right brachial arterial stenosis, With TIA in 2012,  presenting for routine followup. No significant coronary artery disease or carotid arterial disease Echocardiogram in 2012 showing moderate MR and TR with moderate pulmonary hypertension She has significant peripheral vascular disease, followed by Dr. Donnetta Hutching.   History of severe COPD exacerbation, bronchitis. She was on Z-Pak, prednisone, started on albuterol inhaler She took several days of diuretics, torsemide,  with mild weight loss now down to 140 pounds. No significant edema. Symptoms improved  She continues to have significant fatigue,  Weight has been stable . She did not eat very much. She takes Lasix daily. Very slow trend up in her weight. No leg edema. No, swelling or shortness of breath with exertion She's not doing any exercise outside the house. She is currently living with her daughter and does lots of house chores Evaluation of her pacemaker report shows she is 60% RV paced  admitted to the hospital at [facility] on Apr 16 2011 with shortness of breath, acute on chronic diastolic heart failure, underlying hypertension.  possible  pneumonia. White blood cell count was 13. Antibiotics were discontinued after her chest x-ray cleared.  Numerous skin cancers over the past several years, on her legs, face, arms requiring resection History of arthritis in her knees requiring cortisone shot  cardiac catheterization over 5 years ago showing no significant coronary artery disease, recent CT scan showing stable descending aorta and after repair, minimal dilatation of the descending aorta, stable-appearing aortic valve.   EKG shows paced rhythm, rate 71 beats per  minute   Outpatient Encounter Prescriptions as of 02/27/2014  Medication Sig  . albuterol (PROVENTIL HFA;VENTOLIN HFA) 108 (90 BASE) MCG/ACT inhaler Inhale 2 puffs into the lungs every 6 (six) hours as needed for wheezing or shortness of breath.  [name] atenolol (TENORMIN) 100 MG tablet Take 1 tablet (100 mg total) by mouth daily. Take an extra 1/2 tablet for fast heart beat.  [name] guaiFENesin-codeine (CHERATUSSIN AC) 100-10 MG/5ML syrup Take 5 mLs by mouth 3 (three) times daily as needed for cough.  [name] LORazepam (ATIVAN) 0.5 MG tablet Take 0.5 mg by mouth 3 (three) times daily as needed.    . Multiple Vitamins-Minerals (EYE VITAMINS) CAPS Take by mouth daily.    [name] neomycin-bacitracin-polymyxin (NEOSPORIN) ointment Apply topically as needed.    . pantoprazole (PROTONIX) 40 MG tablet Take 40 mg by mouth daily.  . potassium chloride (K-DUR) 10 MEQ tablet Take 1 tablet (10 mEq total) by mouth daily.  [name] torsemide (DEMADEX) 20 MG tablet Take 10 mg by mouth as needed (for an increase in weight of 3 lbs.).  [name] traZODone (DESYREL) 50 MG tablet Take 50 mg by mouth at bedtime.   . triamterene-hydrochlorothiazide (MAXZIDE-25) 37.5-25 MG per tablet Take 1 tablet by mouth daily.  [name] warfarin (COUMADIN) 2.5 MG tablet Take 2.5 mg by mouth. TAKE 2.5 MG AS DIRECTED ON Tuesday, Wednesday, Thursday AND Sunday.  . warfarin (COUMADIN) 5 MG tablet Take as directed by anticoagulation clinic    Review of Systems  Constitutional: Negative.   HENT: Negative.   Eyes: Negative.   Respiratory: Negative.   Cardiovascular: Negative.   Gastrointestinal: Negative.   Endocrine: Negative.   Musculoskeletal: Positive for  arthralgias and gait problem.  Skin: Negative.   Allergic/Immunologic: Negative.   Neurological: Negative.   Hematological: Negative.   Psychiatric/Behavioral: Negative.   All other systems reviewed and are negative.   BP 140/72  Pulse 71  Ht 5\' 3"  (1.6 m)  Wt 149 lb (67.586 kg)  BMI 26.40  kg/m2  Physical Exam  Nursing note and vitals reviewed. Constitutional: She is oriented to person, place, and time. She appears well-developed and well-nourished.  HENT:  Head: Normocephalic.  Nose: Nose normal.  Mouth/Throat: Oropharynx is clear and moist.  Eyes: Conjunctivae are normal. Pupils are equal, round, and reactive to light.  Neck: Normal range of motion. Neck supple. No JVD present.  Cardiovascular: Normal rate, S1 normal, S2 normal and intact distal pulses.  An irregularly irregular rhythm present. Exam reveals no gallop and no friction rub.   Murmur heard.  Crescendo systolic murmur is present with a grade of 1/6  Pulmonary/Chest: Effort normal and breath sounds normal. No respiratory distress. She has no wheezes. She has no rales. She exhibits no tenderness.  Abdominal: Soft. Bowel sounds are normal. She exhibits no distension. There is no tenderness.  Musculoskeletal: Normal range of motion. She exhibits no edema and no tenderness.  Lymphadenopathy:    She has no cervical adenopathy.  Neurological: She is alert and oriented to person, place, and time. Coordination normal.  Skin: Skin is warm and dry. No rash noted. No erythema.  Right LE is wrapped  Psychiatric: She has a normal mood and affect. Her behavior is normal. Judgment and thought content normal.    Assessment and Plan

## 2014-02-27 NOTE — Assessment & Plan Note (Signed)
No recent lipid panel available 

## 2014-02-27 NOTE — Assessment & Plan Note (Signed)
INR 3.7 today. Heart rate relatively well-controlled

## 2014-02-27 NOTE — Assessment & Plan Note (Signed)
Weight over the past several months has been stable at approximately 144-147 pounds on diuretic daily.

## 2014-02-27 NOTE — Assessment & Plan Note (Signed)
Recommended that she use her daughter's recumbent bike and then start swimming this summer

## 2014-03-08 ENCOUNTER — Ambulatory Visit (INDEPENDENT_AMBULATORY_CARE_PROVIDER_SITE_OTHER): Payer: Medicare Other

## 2014-03-08 DIAGNOSIS — I359 Nonrheumatic aortic valve disorder, unspecified: Secondary | ICD-10-CM

## 2014-03-08 DIAGNOSIS — Z5181 Encounter for therapeutic drug level monitoring: Secondary | ICD-10-CM

## 2014-03-08 DIAGNOSIS — G459 Transient cerebral ischemic attack, unspecified: Secondary | ICD-10-CM

## 2014-03-08 DIAGNOSIS — I4891 Unspecified atrial fibrillation: Secondary | ICD-10-CM

## 2014-03-08 LAB — POCT INR: INR: 2.8

## 2014-03-22 ENCOUNTER — Ambulatory Visit (INDEPENDENT_AMBULATORY_CARE_PROVIDER_SITE_OTHER): Payer: Medicare Other

## 2014-03-22 DIAGNOSIS — G459 Transient cerebral ischemic attack, unspecified: Secondary | ICD-10-CM

## 2014-03-22 DIAGNOSIS — I359 Nonrheumatic aortic valve disorder, unspecified: Secondary | ICD-10-CM

## 2014-03-22 DIAGNOSIS — I4891 Unspecified atrial fibrillation: Secondary | ICD-10-CM

## 2014-03-22 DIAGNOSIS — Z5181 Encounter for therapeutic drug level monitoring: Secondary | ICD-10-CM

## 2014-03-22 LAB — POCT INR: INR: 3.4

## 2014-03-27 ENCOUNTER — Telehealth: Payer: Self-pay

## 2014-03-27 NOTE — Telephone Encounter (Signed)
Niece called back with name of ABX, Cephalexin (Keflex). Informed her that there is no drug interaction but, if pt is switched or starts new med/abx to please call us back.

## 2014-03-27 NOTE — Telephone Encounter (Signed)
Pt niece called and states pt has started an antiobiotic, needs direction on coumadin.

## 2014-04-05 ENCOUNTER — Ambulatory Visit (INDEPENDENT_AMBULATORY_CARE_PROVIDER_SITE_OTHER): Payer: Medicare Other

## 2014-04-05 DIAGNOSIS — G459 Transient cerebral ischemic attack, unspecified: Secondary | ICD-10-CM

## 2014-04-05 DIAGNOSIS — I4891 Unspecified atrial fibrillation: Secondary | ICD-10-CM

## 2014-04-05 DIAGNOSIS — Z5181 Encounter for therapeutic drug level monitoring: Secondary | ICD-10-CM

## 2014-04-05 DIAGNOSIS — I359 Nonrheumatic aortic valve disorder, unspecified: Secondary | ICD-10-CM

## 2014-04-05 LAB — POCT INR: INR: 3.5

## 2014-04-07 ENCOUNTER — Other Ambulatory Visit: Payer: Self-pay | Admitting: *Deleted

## 2014-04-07 MED ORDER — TORSEMIDE 20 MG PO TABS: 10.0000 mg | ORAL_TABLET | ORAL | Status: DC | PRN

## 2014-04-07 NOTE — Telephone Encounter (Signed)
Requested Prescriptions   Signed Prescriptions Disp Refills  . torsemide (DEMADEX) 20 MG tablet 30 tablet 3    Sig: Take 0.5 tablets (10 mg total) by mouth as needed (for an increase in weight of 3 lbs.).    Authorizing Provider: Minna Merritts    Ordering User: Britt Bottom

## 2014-04-26 ENCOUNTER — Ambulatory Visit (INDEPENDENT_AMBULATORY_CARE_PROVIDER_SITE_OTHER): Payer: Medicare Other

## 2014-04-26 DIAGNOSIS — I4891 Unspecified atrial fibrillation: Secondary | ICD-10-CM

## 2014-04-26 DIAGNOSIS — G459 Transient cerebral ischemic attack, unspecified: Secondary | ICD-10-CM

## 2014-04-26 DIAGNOSIS — I359 Nonrheumatic aortic valve disorder, unspecified: Secondary | ICD-10-CM

## 2014-04-26 DIAGNOSIS — Z5181 Encounter for therapeutic drug level monitoring: Secondary | ICD-10-CM

## 2014-04-26 LAB — POCT INR: INR: 3.2

## 2014-05-10 ENCOUNTER — Ambulatory Visit (INDEPENDENT_AMBULATORY_CARE_PROVIDER_SITE_OTHER): Payer: Medicare Other | Admitting: *Deleted

## 2014-05-10 DIAGNOSIS — G459 Transient cerebral ischemic attack, unspecified: Secondary | ICD-10-CM

## 2014-05-10 DIAGNOSIS — I4891 Unspecified atrial fibrillation: Secondary | ICD-10-CM

## 2014-05-10 DIAGNOSIS — I359 Nonrheumatic aortic valve disorder, unspecified: Secondary | ICD-10-CM

## 2014-05-10 DIAGNOSIS — Z5181 Encounter for therapeutic drug level monitoring: Secondary | ICD-10-CM

## 2014-05-10 LAB — POCT INR: INR: 2.8

## 2014-05-31 ENCOUNTER — Ambulatory Visit (INDEPENDENT_AMBULATORY_CARE_PROVIDER_SITE_OTHER): Payer: Medicare Other

## 2014-05-31 DIAGNOSIS — I359 Nonrheumatic aortic valve disorder, unspecified: Secondary | ICD-10-CM

## 2014-05-31 DIAGNOSIS — G459 Transient cerebral ischemic attack, unspecified: Secondary | ICD-10-CM

## 2014-05-31 DIAGNOSIS — I4891 Unspecified atrial fibrillation: Secondary | ICD-10-CM

## 2014-05-31 DIAGNOSIS — Z5181 Encounter for therapeutic drug level monitoring: Secondary | ICD-10-CM

## 2014-05-31 LAB — POCT INR: INR: 2.7

## 2014-06-14 ENCOUNTER — Other Ambulatory Visit: Payer: Self-pay | Admitting: Obstetrics & Gynecology

## 2014-06-14 DIAGNOSIS — R928 Other abnormal and inconclusive findings on diagnostic imaging of breast: Secondary | ICD-10-CM

## 2014-06-16 ENCOUNTER — Telehealth: Payer: Self-pay

## 2014-06-16 NOTE — Telephone Encounter (Signed)
Ms. Luvenia Starch called back and pt is taking 500mg s QD of Cipro, she started Tuesday for 5 day course. Instructed pt to take 2.5mg s of Coumadin today eat something green today. Keep normal dosage and f/u appt on 06/28/14 and call back with other med changes.

## 2014-06-16 NOTE — Telephone Encounter (Signed)
Pt niece called, states pt is taking Cipro needs advice about Coumadin

## 2014-06-28 ENCOUNTER — Ambulatory Visit (INDEPENDENT_AMBULATORY_CARE_PROVIDER_SITE_OTHER): Payer: Medicare Other | Admitting: *Deleted

## 2014-06-28 DIAGNOSIS — I359 Nonrheumatic aortic valve disorder, unspecified: Secondary | ICD-10-CM

## 2014-06-28 DIAGNOSIS — Z5181 Encounter for therapeutic drug level monitoring: Secondary | ICD-10-CM

## 2014-06-28 DIAGNOSIS — I4891 Unspecified atrial fibrillation: Secondary | ICD-10-CM

## 2014-06-28 DIAGNOSIS — G459 Transient cerebral ischemic attack, unspecified: Secondary | ICD-10-CM

## 2014-06-28 LAB — POCT INR: INR: 2.6

## 2014-07-11 ENCOUNTER — Encounter: Payer: Self-pay | Admitting: Internal Medicine

## 2014-07-11 ENCOUNTER — Ambulatory Visit (INDEPENDENT_AMBULATORY_CARE_PROVIDER_SITE_OTHER): Payer: Medicare Other | Admitting: Internal Medicine

## 2014-07-11 VITALS — BP 102/58 | HR 68 | Ht 63.0 in | Wt 153.0 lb

## 2014-07-11 DIAGNOSIS — I4891 Unspecified atrial fibrillation: Secondary | ICD-10-CM

## 2014-07-11 LAB — MDC_IDC_ENUM_SESS_TYPE_INCLINIC
Battery Impedance: 2365 Ohm
Battery Remaining Longevity: 27 mo
Battery Voltage: 2.73 V
Brady Statistic RV Percent Paced: 89 %
Lead Channel Impedance Value: 0 Ohm
Lead Channel Pacing Threshold Amplitude: 1 V
Lead Channel Sensing Intrinsic Amplitude: 15.67 mV
Lead Channel Setting Pacing Amplitude: 2 V
Lead Channel Setting Pacing Pulse Width: 0.4 ms
MDC IDC MSMT LEADCHNL RV IMPEDANCE VALUE: 586 Ohm
MDC IDC MSMT LEADCHNL RV PACING THRESHOLD PULSEWIDTH: 0.4 ms
MDC IDC SESS DTM: 20150825111705
MDC IDC SET LEADCHNL RV SENSING SENSITIVITY: 5.6 mV

## 2014-07-11 MED ORDER — ATENOLOL 100 MG PO TABS: 50.0000 mg | ORAL_TABLET | Freq: Every day | ORAL | Status: DC

## 2014-07-11 MED ORDER — TORSEMIDE 20 MG PO TABS: 10.0000 mg | ORAL_TABLET | Freq: Every day | ORAL | Status: DC

## 2014-07-11 NOTE — Progress Notes (Signed)
Patient Care Team: Jodi Marble, MD as PCP - General (Internal Medicine) Minna Merritts, MD (Cardiology) Jorja Loa, MD as Attending Physician (Urology) Kerin Salen, MD (Orthopedic Surgery)   HPI  [name] is a 80 y.o. female Seen in followup for permanent pacer implanted in the setting of permanent atrial fibrillation and bradycardia. She is also status post bioprosthetic aortic valve replacement. She has prior TIA with her INR target 2.5-3.0  She has had past treated with when necessary diuretics  Her to biggest complaints today involved 1-recurrent skin cancers which are painful 2-nocturnal palpitations and 3- dyspnea on exertion; this seems to be improved on the days that she takes her diuretics. She is also noted recently abdominal distention  She reports her blood pressures at home. They're normal in the range of 115-150.  She complains of increasing exercise intolerance manifested by shortness of breath as well as a chest tightness that radiates up into her neck. It is relieved by rest. She has a history of a remote catheterization in [place]; there is also a stress test that was done more than 6 years ago. She has no known coronary disease she underwent Myoview scanning  9/14 demonstrated normal perfusion without ischemia and ejection fraction 66%  Echocardiogram in February 2012 showed ejection fraction 50-55%, pulmonary pressures 46 mm of mercury; mild aortic stenosis with intact aortic valve, bioprosthetic valve, otherwise normal study.       Past Medical History  Diagnosis Date  . Bradycardia     Severe in the setting of permanent atrial fibrillation with tachybrady syndrome s/p single-chamber Medtronic pacemaker placement 08/2007  . Permanent atrial fibrillation   . Tachy-brady syndrome   . S/P placement of cardiac pacemaker   . Brain aneurysm     Hx of  . Hypertension   . Thoracic aneurysm   . AAA (abdominal aortic aneurysm)     S/P  resection  . S/P aortic valve replacement with bioprosthetic valve     In the [facility] prior to moving here  . Seizures   . Diastolic heart failure   . Diverticulosis   . Hemorrhoids   . Gastritis   . Hepatitis B 1980's  . Skin cancer     face, ear  and left hand.    Past Surgical History  Procedure Laterality Date  . Cerebral aneurysm repair    . Abdominal aortic aneurysm repair      Resection  . Aortic valve replacement      With bioprosthetic valve  . Pacemaker placement  08/2007    Medtronic; AF permanent; s/p pacemaker for bradycardia with now well controlled ventricular response; Digoxin therapy, question level; Lower extremity venous issues  . Breast biopsy    . Cholecystectomy    . Vesicovaginal fistula closure w/ tah    . Appendectomy    . Lung biopsy  2005  . Skin cancer removal      ear, face and hand    Current Outpatient Prescriptions  Medication Sig Dispense Refill  . atenolol (TENORMIN) 100 MG tablet Take 0.5 tablets (50 mg total) by mouth at bedtime. Take an extra 1/2 tablet for fast heart beat.  45 tablet  6  . LORazepam (ATIVAN) 0.5 MG tablet Take 0.5 mg by mouth 3 (three) times daily as needed.        . mirabegron ER (MYRBETRIQ) 25 MG TB24 tablet Take 25 mg by mouth daily.      . Multiple Vitamins-Minerals (EYE  VITAMINS) CAPS Take by mouth daily.        . pantoprazole (PROTONIX) 40 MG tablet Take 40 mg by mouth daily.      . potassium chloride (K-DUR) 10 MEQ tablet Take 1 tablet (10 mEq total) by mouth daily.  90 tablet  3  . torsemide (DEMADEX) 20 MG tablet Take 0.5 tablets (10 mg total) by mouth daily.  30 tablet  3  . traMADol (ULTRAM) 50 MG tablet Take by mouth every 6 (six) hours as needed.      . traZODone (DESYREL) 50 MG tablet Take 50 mg by mouth at bedtime.       . triamterene-hydrochlorothiazide (MAXZIDE-25) 37.5-25 MG per tablet Take 1 tablet by mouth daily.      [name] warfarin (COUMADIN) 2.5 MG tablet Take 2.5 mg by mouth. TAKE 2.5 MG AS  DIRECTED ON Tuesday, Wednesday, Thursday AND Sunday.      . warfarin (COUMADIN) 5 MG tablet Take as directed by anticoagulation clinic  40 tablet  3   No current facility-administered medications for this visit.    Allergies  Allergen Reactions  . Fluticasone-Salmeterol   . Furosemide   . Iodine   . Iohexol      Desc: patient requires a 13 hr prep, when doing exam with contrast,,,,,fever; chills; dyspnea(slg/04/27/09--given 2 hour solumedrol/ benadryl prep--no reaction)   . Latex   . Penicillins   . Sulfonamide Derivatives   . Verapamil     Review of Systems negative except from HPI and PMH  Physical Exam BP 102/58  Pulse 68  Ht 5\' 3"  (1.6 m)  Wt 153 lb (69.4 kg)  BMI 27.11 kg/m2 Well developed and well nourished in no acute distress HENT normal E scleral and icterus clear Neck Supple Clear to ausculation  Regular rate and rhythm, no murmurs gallops or rub Soft with active bowel sounds; mild distention No clubbing cyanosis 1+ Edema Alert and oriented, grossly normal motor and sensory function Skin Warm and Dry    Assessment and  Plan  HFpEF  Atrial fibrillation-permanent The patient's device was interrogated.  The information was reviewed. No changes were made in the programming.    Pacemaker-Medtronic  Nocturnal palpitations  Hypertension  Is unclear as to the cause of her palpitations. We will have her change her atenolol from 100 mg every morning--50 each bedtime.  Will have her increase her diuretic daily furosemide 10 mg daily. Will have her followup in about 8 weeks' time for assessment of her edema, shortness of breath and metabolic profile.

## 2014-07-11 NOTE — Patient Instructions (Addendum)
Your physician has recommended you make the following change in your medication:  Decrease Atenolol to 50 mg once daily at bedtime Start taking Torsemide 10 mg (1/2 tablet) once daily   Your physician recommends that you return for lab work in:  3-4 weeks when you come for follow up  BMP  Your physician recommends that you schedule a follow-up appointment in:  3-4 weeks with PA or NP  Your physician wants you to follow-up in: Device clinic in 6 months. You will receive a reminder letter in the mail two months in advance. If you don't receive a letter, please call our office to schedule the follow-up appointment.  Your physician wants you to follow-up in: Dr. Caryl Comes in 1 year.  You will receive a reminder letter in the mail two months in advance. If you don't receive a letter, please call our office to schedule the follow-up appointment.

## 2014-07-12 ENCOUNTER — Encounter: Payer: Self-pay | Admitting: Surgery

## 2014-07-18 ENCOUNTER — Encounter: Payer: Self-pay | Admitting: Surgery

## 2014-08-01 ENCOUNTER — Encounter: Payer: Self-pay | Admitting: Nurse Practitioner

## 2014-08-01 ENCOUNTER — Ambulatory Visit (INDEPENDENT_AMBULATORY_CARE_PROVIDER_SITE_OTHER): Payer: Medicare Other | Admitting: Nurse Practitioner

## 2014-08-01 ENCOUNTER — Ambulatory Visit (INDEPENDENT_AMBULATORY_CARE_PROVIDER_SITE_OTHER): Payer: Medicare Other

## 2014-08-01 ENCOUNTER — Ambulatory Visit (INDEPENDENT_AMBULATORY_CARE_PROVIDER_SITE_OTHER): Payer: Medicare Other | Admitting: *Deleted

## 2014-08-01 VITALS — BP 110/62 | HR 65 | Ht 63.0 in | Wt 149.5 lb

## 2014-08-01 DIAGNOSIS — R5381 Other malaise: Secondary | ICD-10-CM

## 2014-08-01 DIAGNOSIS — R5382 Chronic fatigue, unspecified: Secondary | ICD-10-CM

## 2014-08-01 DIAGNOSIS — R002 Palpitations: Secondary | ICD-10-CM

## 2014-08-01 DIAGNOSIS — I359 Nonrheumatic aortic valve disorder, unspecified: Secondary | ICD-10-CM

## 2014-08-01 DIAGNOSIS — I4891 Unspecified atrial fibrillation: Secondary | ICD-10-CM

## 2014-08-01 DIAGNOSIS — I5032 Chronic diastolic (congestive) heart failure: Secondary | ICD-10-CM

## 2014-08-01 DIAGNOSIS — G459 Transient cerebral ischemic attack, unspecified: Secondary | ICD-10-CM

## 2014-08-01 DIAGNOSIS — Z5181 Encounter for therapeutic drug level monitoring: Secondary | ICD-10-CM

## 2014-08-01 DIAGNOSIS — R5383 Other fatigue: Secondary | ICD-10-CM

## 2014-08-01 LAB — POCT INR: INR: 4

## 2014-08-01 NOTE — Progress Notes (Signed)
Patient Name: [name] Date of Encounter: 08/01/2014  Primary Care Provider:  [name], MD Primary Cardiologist:  Johnny Bridge, MD / S. Caryl Comes, MD   Patient Profile  80 y/o female who presents for f/u r/t lower ext edema, palpitations, and fatigue.  Problem List   Past Medical History  Diagnosis Date  . Permanent atrial fibrillation     a. Chronic coumadin.  . Tachy-brady syndrome     a. Severe in the setting of permanent atrial fibrillation with tachybrady syndrome;  b. 08/2007 s/p MDT Adapta ADSR01 DC PPM.  . S/P placement of cardiac pacemaker     a.  08/2007 s/p MDT [place] PPM.  . Brain aneurysm     Hx of  . Hypertension   . Thoracic aneurysm     a. 10/2011 CTA: 5.0 x 3.8 cm.  [name] AAA (abdominal aortic aneurysm)     a. 12/2011 U/S: 3.3 cm prox AAA.  . S/P aortic valve replacement with bioprosthetic valve     a. [facility].  [name] Aortic insufficiency     a. S/P bioprosthetic AVR;  b. 02/2013 Echo: EF 50-55%, mild to mod AI (mean grad 45mmHg).  . Chronic diastolic CHF (congestive heart failure)     a. 02/2013 Echo: EF 50-55%.  . Diverticulosis   . Hemorrhoids   . Gastritis   . Hepatitis B 1980's  . Skin cancer     face, ear  and left hand.  . Seizures   . Moderate mitral regurgitation     a. 02/2013 Echo: Mod MR.  . Moderate tricuspid regurgitation     a. 02/2013 Echo: Mod TR.  [name] Pulmonary hypertension     a. 02/2013 Echo: PASP 34mmHg.  . Chest pain     a. H/o nonobs cath;  b. 07/2013 Lexiscan MV: EF 66%, no ischemia.   Past Surgical History  Procedure Laterality Date  . Cerebral aneurysm repair    . Abdominal aortic aneurysm repair      Resection  . Aortic valve replacement      With bioprosthetic valve  . Pacemaker placement  08/2007    Medtronic; AF permanent; s/p pacemaker for bradycardia with now well controlled ventricular response; Digoxin therapy, question level; Lower extremity venous issues  . Breast biopsy    .  Cholecystectomy    . Vesicovaginal fistula closure w/ tah    . Appendectomy    . Lung biopsy  2005  . Skin cancer removal      ear, face and hand    Allergies  Allergies  Allergen Reactions  . Fluticasone-Salmeterol   . Furosemide   . Iodine   . Iohexol      Desc: patient requires a 13 hr prep, when doing exam with contrast,,,,,fever; chills; dyspnea(slg/04/27/09--given 2 hour solumedrol/ benadryl prep--no reaction)   . Latex   . Penicillins   . Sulfonamide Derivatives   . Verapamil     HPI  80 year old female with the above problem list.  She was last seen by Dr. Caryl Comes on August 25.  At that time she complained of nocturnal palpitations and also increasing lower extremity edema, and reduced exercise tolerance.  Recommendation was made for alteration of her atenolol doseand torsemide 10 mg daily was added to her regimen which already included Maxide.  She says that she stopped having nocturnal palpitations and so it never changed her atenolol regimen.  After starting additional diuretic, she noticed significant improvement in lower extremity edema  and a 13 pound weight loss.  She says her weight is more or less leveled off in the past week or so.  She has not been experiencing any dysp early satiety, or PND, however she continues to feel relatively fatigued with little exercise tolerance.  She says that she just feels tired all the time.  She has not had any chest pain.  Home Medications  Prior to Admission medications   Medication Sig Start Date End Date Taking? Authorizing Provider  atenolol (TENORMIN) 100 MG tablet Take 0.5 tablets (50 mg total) by mouth at bedtime. Take an extra 1/2 tablet for fast heart beat. 07/11/14 07/11/15 Yes Deboraha Sprang, MD  LORazepam (ATIVAN) 0.5 MG tablet Take 0.5 mg by mouth 3 (three) times daily as needed.     Yes Historical Provider, MD  mirabegron ER (MYRBETRIQ) 25 MG TB24 tablet Take 25 mg by mouth daily.   Yes Historical Provider, MD  Multiple  Vitamins-Minerals (EYE VITAMINS) CAPS Take by mouth daily.     Yes Historical Provider, MD  pantoprazole (PROTONIX) 40 MG tablet Take 40 mg by mouth daily.   Yes Historical Provider, MD  potassium chloride (K-DUR) 10 MEQ tablet Take 1 tablet (10 mEq total) by mouth daily. 11/25/13  Yes Minna Merritts, MD  torsemide (DEMADEX) 20 MG tablet Take 0.5 tablets (10 mg total) by mouth daily. 07/11/14  Yes Deboraha Sprang, MD  traMADol (ULTRAM) 50 MG tablet Take by mouth every 6 (six) hours as needed.   Yes Historical Provider, MD  traZODone (DESYREL) 50 MG tablet Take 50 mg by mouth at bedtime.    Yes Historical Provider, MD  triamterene-hydrochlorothiazide (MAXZIDE-25) 37.5-25 MG per tablet Take 1 tablet by mouth daily.   Yes Historical Provider, MD  warfarin (COUMADIN) 2.5 MG tablet Take 2.5 mg by mouth. TAKE 2.5 MG AS DIRECTED ON Tuesday, Wednesday, Thursday AND Sunday.   Yes Historical Provider, MD  warfarin (COUMADIN) 5 MG tablet Take as directed by anticoagulation clinic 01/23/14  Yes Deboraha Sprang, MD    Review of Systems  Persistent fatigue and reduced exercise tolerance as outlined above.  She denies PND, orthopnea, dizziness, syncope, edema, chest pain, or early satiety.  All other systems reviewed and are otherwise negative except as noted above.  Physical Exam  Blood pressure 110/62, pulse 65, height 5\' 3"  (1.6 m), weight 149 lb 8 oz (67.813 kg).  General: Pleasant, NAD Psych: Normal affect. Neuro: Alert and oriented X 3. Moves all extremities spontaneously. HEENT: Normal  Neck: Supple without bruits or JVD. Lungs:  Resp regular and unlabored, CTA. Heart: RRR no s3, s4, 2/6 systolic ejection murmur at the right upper sternal border. Abdomen: Soft, non-tender, non-distended, BS + x 4.  Extremities: No clubbing, cyanosis or edema. DP/PT/Radials 1+ and equal bilaterally.  Accessory Clinical Findings  ECG - AV paced, underlying A. Fib, 65  Assessment & Plan  1.  Chronic diastolic  congestive heart failure: She is a complete resolution of lower extremity edema and weight is down 13 pounds on her home scale.  She has no evidence of iron overload today.  She continues to feel fatigued despite improve I would status.  I'm concerned that she may be dehydrated in the setting of both torsemide and Maxzide therapy.  I will check a basic metabolic panel today I would have a low threshold to discontinue torsemide.  Heart rate and blood pressure is currently well controlled.  2.  Nocturnal palpitations: These have resolved without  alteration to her home atenolol dose.  She'll continue beta blocker as previously prescribed.  3.  Persistent fatigue: Etiology not clear though dehydration may be playing a role currently.  She had normal LV function by echocardiogram in April 2014 with non-ischemic stress test in September of 2014, at which time she was having exertional chest pain and dyspnea.  She thinks that she had TFT's with her annual visit with her PCP earlier this year.  She will check with him.  4.  Afib:  Persistent.  V-paced.  Chronic coumadin.  5.  S/P bioprosthetic AVR: mild-mod AI on echo 02/2013.  6.  Thoracic Ao Aneurysm/AAA:  Pt prev seen by Dr. Donnetta Hutching - last 2014 @ which time determination was made to not follow aneurysms any further given advanced age and high surgical risk.  That said, pt and dtr have requested to check in with Dr. Donnetta Hutching.  She has not been having any abdominal or chest pain.    7.  Disposition:  F/U BMET today.  F/U Dr. Rockey Situ in 3 mos.   Murray Hodgkins, NP 08/01/2014, 4:13 PM

## 2014-08-01 NOTE — Patient Instructions (Addendum)
We will draw labs today:  BMET  You are scheduled to see Dr. Donnetta Hutching on October 6 @ 3:00 You will have ABIs first and then see the doctor Please call [phone] if you are unable to keep this appt  Please follow up with Dr. Rockey Situ in 3 months

## 2014-08-02 LAB — BASIC METABOLIC PANEL
BUN / CREAT RATIO: 24 (ref 11–26)
BUN: 25 mg/dL (ref 8–27)
CHLORIDE: 96 mmol/L — AB (ref 97–108)
CO2: 31 mmol/L — ABNORMAL HIGH (ref 18–29)
Calcium: 9.3 mg/dL (ref 8.7–10.3)
Creatinine, Ser: 1.06 mg/dL — ABNORMAL HIGH (ref 0.57–1.00)
GFR calc non Af Amer: 47 mL/min/{1.73_m2} — ABNORMAL LOW (ref >59)
GFR, EST AFRICAN AMERICAN: 54 mL/min/{1.73_m2} — AB (ref >59)
Glucose: 97 mg/dL (ref 65–99)
Potassium: 4.1 mmol/L (ref 3.5–5.2)
SODIUM: 141 mmol/L (ref 134–144)

## 2014-08-07 ENCOUNTER — Other Ambulatory Visit: Payer: Self-pay | Admitting: *Deleted

## 2014-08-07 DIAGNOSIS — I739 Peripheral vascular disease, unspecified: Secondary | ICD-10-CM

## 2014-08-07 DIAGNOSIS — L97909 Non-pressure chronic ulcer of unspecified part of unspecified lower leg with unspecified severity: Secondary | ICD-10-CM

## 2014-08-09 ENCOUNTER — Ambulatory Visit (INDEPENDENT_AMBULATORY_CARE_PROVIDER_SITE_OTHER): Payer: Medicare Other | Admitting: Pharmacist

## 2014-08-09 DIAGNOSIS — I4891 Unspecified atrial fibrillation: Secondary | ICD-10-CM

## 2014-08-09 DIAGNOSIS — I359 Nonrheumatic aortic valve disorder, unspecified: Secondary | ICD-10-CM

## 2014-08-09 DIAGNOSIS — G459 Transient cerebral ischemic attack, unspecified: Secondary | ICD-10-CM

## 2014-08-09 DIAGNOSIS — Z5181 Encounter for therapeutic drug level monitoring: Secondary | ICD-10-CM

## 2014-08-09 LAB — POCT INR: INR: 3.8

## 2014-08-15 ENCOUNTER — Telehealth: Payer: Self-pay

## 2014-08-15 LAB — WOUND CULTURE

## 2014-08-15 NOTE — Telephone Encounter (Signed)
Spoke with Maudie Mercury (EC).  Pt was started on Cipro 500mg  BID x 2 weeks for an infection on her foot.  Pt's INR was elevated last week.  Will have her go to the [place] office tomorrow to check INR given dose was changed last week.

## 2014-08-15 NOTE — Telephone Encounter (Signed)
Pt niece called and states pt was started on Cipro today, wanted to let us know due to her coumadin.

## 2014-08-16 ENCOUNTER — Ambulatory Visit (INDEPENDENT_AMBULATORY_CARE_PROVIDER_SITE_OTHER): Payer: Medicare Other

## 2014-08-16 DIAGNOSIS — I4891 Unspecified atrial fibrillation: Secondary | ICD-10-CM

## 2014-08-16 DIAGNOSIS — G459 Transient cerebral ischemic attack, unspecified: Secondary | ICD-10-CM

## 2014-08-16 DIAGNOSIS — I359 Nonrheumatic aortic valve disorder, unspecified: Secondary | ICD-10-CM

## 2014-08-16 DIAGNOSIS — Z5181 Encounter for therapeutic drug level monitoring: Secondary | ICD-10-CM

## 2014-08-16 LAB — POCT INR: INR: 2.7

## 2014-08-21 ENCOUNTER — Encounter: Payer: Self-pay | Admitting: Vascular Surgery

## 2014-08-22 ENCOUNTER — Encounter: Payer: Self-pay | Admitting: Vascular Surgery

## 2014-08-22 ENCOUNTER — Ambulatory Visit (INDEPENDENT_AMBULATORY_CARE_PROVIDER_SITE_OTHER): Payer: Medicare Other | Admitting: Vascular Surgery

## 2014-08-22 ENCOUNTER — Ambulatory Visit (INDEPENDENT_AMBULATORY_CARE_PROVIDER_SITE_OTHER): Payer: Medicare Other

## 2014-08-22 ENCOUNTER — Ambulatory Visit (HOSPITAL_COMMUNITY)
Admission: RE | Admit: 2014-08-22 | Discharge: 2014-08-22 | Disposition: A | Discharge status: Home / Self Care | Payer: Medicare Other | Source: Ambulatory Visit | Attending: Vascular Surgery | Admitting: Vascular Surgery

## 2014-08-22 VITALS — BP 131/73 | HR 62 | Ht 63.0 in | Wt 149.0 lb

## 2014-08-22 DIAGNOSIS — I4891 Unspecified atrial fibrillation: Secondary | ICD-10-CM

## 2014-08-22 DIAGNOSIS — G459 Transient cerebral ischemic attack, unspecified: Secondary | ICD-10-CM

## 2014-08-22 DIAGNOSIS — I739 Peripheral vascular disease, unspecified: Secondary | ICD-10-CM

## 2014-08-22 DIAGNOSIS — I359 Nonrheumatic aortic valve disorder, unspecified: Secondary | ICD-10-CM

## 2014-08-22 DIAGNOSIS — Z5181 Encounter for therapeutic drug level monitoring: Secondary | ICD-10-CM

## 2014-08-22 DIAGNOSIS — L97909 Non-pressure chronic ulcer of unspecified part of unspecified lower leg with unspecified severity: Secondary | ICD-10-CM

## 2014-08-22 LAB — POCT INR: INR: 2.7

## 2014-08-22 NOTE — Progress Notes (Signed)
History of Present Illness  [name] is a 80 y.o. female who presents with chief complaint: left foot pain s/p removal of skin cancers which are painful.  It has been 2 months of follow ups to the wound center.   Her niece states it is getting smaller and healing, it is just very painful.  She wants to make sure that she is doing everything she can to get this wound to heal.       Past Medical History  Diagnosis Date  . Permanent atrial fibrillation     a. Chronic coumadin.  . Tachy-brady syndrome     a. Severe in the setting of permanent atrial fibrillation with tachybrady syndrome;  b. 08/2007 s/p MDT [place] ADSR01 [place] PPM.  . S/P placement of cardiac pacemaker     a.  08/2007 s/p MDT [place] PPM.  . Brain aneurysm     Hx of  . Hypertension   . Thoracic aneurysm     a. 10/2011 CTA: 5.0 x 3.8 cm.  [name] AAA (abdominal aortic aneurysm)     a. 12/2011 U/S: 3.3 cm prox AAA.  . S/P aortic valve replacement with bioprosthetic valve     a. [facility].  [name] Aortic insufficiency     a. S/P bioprosthetic AVR;  b. 02/2013 Echo: EF 50-55%, mild to mod AI (mean grad 86mmHg).  . Chronic diastolic CHF (congestive heart failure)     a. 02/2013 Echo: EF 50-55%.  . Diverticulosis   . Hemorrhoids   . Gastritis   . Hepatitis B 1980's  . Skin cancer     face, ear  and left hand.  . Seizures   . Moderate mitral regurgitation     a. 02/2013 Echo: Mod MR.  . Moderate tricuspid regurgitation     a. 02/2013 Echo: Mod TR.  [name] Pulmonary hypertension     a. 02/2013 Echo: PASP 49mmHg.  . Chest pain     a. H/o nonobs cath;  b. 07/2013 Lexiscan MV: EF 66%, no ischemia.    Past Surgical History  Procedure Laterality Date  . Cerebral aneurysm repair    . Abdominal aortic aneurysm repair      Resection  . Aortic valve replacement      With bioprosthetic valve  . Pacemaker placement  08/2007    Medtronic; AF permanent; s/p pacemaker for bradycardia with now well controlled  ventricular response; Digoxin therapy, question level; Lower extremity venous issues  . Breast biopsy    . Cholecystectomy    . Vesicovaginal fistula closure w/ tah    . Appendectomy    . Lung biopsy  2005  . Skin cancer removal      ear, face and hand    History   Social History  . Marital Status: Married    Spouse Name: N/A    Number of Children: N/A  . Years of Education: N/A   Occupational History  . Retired    Social History Main Topics  . Smoking status: Never Smoker   . Smokeless tobacco: Not on file  . Alcohol Use: No  . Drug Use: No  . Sexual Activity: Not on file   Other Topics Concern  . Not on file   Social History Narrative   Married    Family History  Problem Relation Age of Onset  . Cancer Mother   . Heart disease Mother   . Hypertension Mother   . Varicose Veins Mother   .  Heart disease Father   . Hypertension Father   . Heart disease Sister   . Hypertension Sister   . Varicose Veins Sister   . AAA (abdominal aortic aneurysm) Sister   . Heart disease Brother   . Hypertension Brother     Current Outpatient Prescriptions on File Prior to Visit  Medication Sig Dispense Refill  . atenolol (TENORMIN) 100 MG tablet Take 0.5 tablets (50 mg total) by mouth at bedtime. Take an extra 1/2 tablet for fast heart beat.  45 tablet  6  . doxycycline (DORYX) 100 MG DR capsule Take 100 mg by mouth 2 (two) times daily.      [name] LORazepam (ATIVAN) 0.5 MG tablet Take 0.5 mg by mouth 3 (three) times daily as needed.        . mirabegron ER (MYRBETRIQ) 25 MG TB24 tablet Take 25 mg by mouth daily.      . Multiple Vitamins-Minerals (EYE VITAMINS) CAPS Take by mouth daily.        . pantoprazole (PROTONIX) 40 MG tablet Take 40 mg by mouth daily.      . potassium chloride (K-DUR) 10 MEQ tablet Take 1 tablet (10 mEq total) by mouth daily.  90 tablet  3  . torsemide (DEMADEX) 20 MG tablet Take 0.5 tablets (10 mg total) by mouth daily.  30 tablet  3  . traMADol (ULTRAM) 50  MG tablet Take by mouth every 6 (six) hours as needed.      . traZODone (DESYREL) 50 MG tablet Take 50 mg by mouth at bedtime.       . triamterene-hydrochlorothiazide (MAXZIDE-25) 37.5-25 MG per tablet Take 1 tablet by mouth daily.      [name] warfarin (COUMADIN) 2.5 MG tablet Take 2.5 mg by mouth. TAKE 2.5 MG AS DIRECTED ON Tuesday, Wednesday, Thursday AND Sunday.      . warfarin (COUMADIN) 5 MG tablet Take as directed by anticoagulation clinic  40 tablet  3   No current facility-administered medications on file prior to visit.    Allergies  Allergen Reactions  . Fluticasone-Salmeterol   . Furosemide   . Iodine   . Iohexol      Desc: patient requires a 13 hr prep, when doing exam with contrast,,,,,fever; chills; dyspnea(slg/04/27/09--given 2 hour solumedrol/ benadryl prep--no reaction)   . Latex   . Penicillins   . Sulfonamide Derivatives   . Verapamil     Review of Systems: As listed above, otherwise negative.  Physical Examination  Filed Vitals:   08/22/14 1546  BP: 131/73  Pulse: 62  Height: 5\' 3"  (1.6 m)  Weight: 149 lb (67.586 kg)  SpO2: 95%    General: A&O x 3, WDWN  Pulmonary: Sym exp, good air movt, CTAB  Cardiac: RRR  Gastrointestinal: soft, NTND  Musculoskeletal: M/S 5/5 throughout  Extremities without ischemic changes   Left LE dorsum wound 1 cm with yellow eschar center.  Tender to palpation at edges.  Non tender plantar foot.  Cap refill brisk.  Doppler DP/PT signal present.  Reviewed ABI's from 1 year ago show:0.68 on the right and 0.78 on the left. She is monophasic distal waveforms. I she does have a duplex exam suggesting a right popliteal artery occlusion and probable MID suprasternal artery occlusion on the left.    Medical Decision Making  [name] is a 80 y.o. female who presents with: Left foot poor healing wound s/p skin cancer removal.   She has bilateral arterial insufficiency.  The risk verses  benefit of surgery to perform a Fem-pop  due to healing and age would be better to let this heal by local wound care and good nutrition.  Dr. Donnetta Hutching recommends continue observation.  If the foot wound deteriorates she should follow up.  If the wound continues to heal then she will follow up PRN.  The patient was seen in conjunction with Dr. Donnetta Hutching today.  Theda Sers Oriel Ojo [facility] PA-C  [facility] Office: [phone]   08/22/2014, 4:30 PM  I have examined the patient, reviewed and agree with above. Multilevel lower from a peripheral vascular occlusive disease. Would require her care artery and probable pop or fem-tib bypass. Would not be a candidate for percutaneous treatment. The patient is comfortable with continued observation. It has taken an extremely long time for healing to the contralateral leg. I suspect this will take quite a while should eventually heal. We'll see [place] again as needed if this progresses. We did discuss her none thoracoabdominal aneurysm. Has not been followed since 2012th and would not continue to image this and she clearly would not be a candidate for thoracoabdominal aneurysm surgery. She is comfortable with this decision  EARLY, TODD, MD 08/22/2014 5:12 PM

## 2014-08-23 ENCOUNTER — Encounter: Payer: Self-pay | Admitting: Surgery

## 2014-09-06 ENCOUNTER — Ambulatory Visit (INDEPENDENT_AMBULATORY_CARE_PROVIDER_SITE_OTHER): Payer: Medicare Other

## 2014-09-06 DIAGNOSIS — G459 Transient cerebral ischemic attack, unspecified: Secondary | ICD-10-CM

## 2014-09-06 DIAGNOSIS — I4891 Unspecified atrial fibrillation: Secondary | ICD-10-CM

## 2014-09-06 DIAGNOSIS — I359 Nonrheumatic aortic valve disorder, unspecified: Secondary | ICD-10-CM

## 2014-09-06 DIAGNOSIS — Z5181 Encounter for therapeutic drug level monitoring: Secondary | ICD-10-CM

## 2014-09-06 LAB — POCT INR: INR: 2.3

## 2014-09-14 ENCOUNTER — Telehealth: Payer: Self-pay | Admitting: Cardiovascular Disease

## 2014-09-14 NOTE — Telephone Encounter (Signed)
Returned call to pt's daughter, rx Levaquin 500mg  x 7 days started on Tuesday 09/12/14. Advised this abx can increase the effects of Coumadin and elevate the INR.  Made OV for 09/15/14 at 2pm for INR check.  Pt's daughter aware of appt date and time.

## 2014-09-14 NOTE — Telephone Encounter (Signed)
Pt niece is calling asking that pt has been put on antibiotic and is on coumadin and wants to know if there will be concern for her next coumadin apt

## 2014-09-15 ENCOUNTER — Emergency Department: Payer: Self-pay | Admitting: Emergency Medicine

## 2014-09-15 LAB — TROPONIN I: Troponin-I: <0.02 ng/mL

## 2014-09-15 LAB — BASIC METABOLIC PANEL
Anion Gap: 8 (ref 7–16)
BUN: 27 mg/dL — ABNORMAL HIGH (ref 7–18)
CO2: 33 mmol/L — AB (ref 21–32)
CREATININE: 0.94 mg/dL (ref 0.60–1.30)
Calcium, Total: 8.7 mg/dL (ref 8.5–10.1)
Chloride: 98 mmol/L (ref 98–107)
GFR CALC NON AF AMER: 59 — AB
Glucose: 105 mg/dL — ABNORMAL HIGH (ref 65–99)
Osmolality: 283 (ref 275–301)
POTASSIUM: 3.7 mmol/L (ref 3.5–5.1)
Sodium: 139 mmol/L (ref 136–145)

## 2014-09-15 LAB — CBC WITH DIFFERENTIAL/PLATELET
BASOS ABS: 0.2 10*3/uL — AB (ref 0.0–0.1)
Basophil %: 3.1 %
EOS PCT: 1.6 %
Eosinophil #: 0.1 10*3/uL (ref 0.0–0.7)
HCT: 41.1 % (ref 35.0–47.0)
HGB: 13.3 g/dL (ref 12.0–16.0)
Lymphocyte #: 1.1 10*3/uL (ref 1.0–3.6)
Lymphocyte %: 16.1 %
MCH: 29.6 pg (ref 26.0–34.0)
MCHC: 32.5 g/dL (ref 32.0–36.0)
MCV: 91 fL (ref 80–100)
MONO ABS: 0.7 x10 3/mm (ref 0.2–0.9)
Monocyte %: 9.6 %
NEUTROS ABS: 4.9 10*3/uL (ref 1.4–6.5)
Neutrophil %: 69.6 %
Platelet: 224 10*3/uL (ref 150–440)
RBC: 4.51 10*6/uL (ref 3.80–5.20)
RDW: 14.3 % (ref 11.5–14.5)
WBC: 7.1 10*3/uL (ref 3.6–11.0)

## 2014-09-15 LAB — PROTIME-INR
INR: 4.4 — AB
Prothrombin Time: 40.6 secs — ABNORMAL HIGH (ref 11.5–14.7)

## 2014-09-17 ENCOUNTER — Encounter: Payer: Self-pay | Admitting: Surgery

## 2014-09-18 ENCOUNTER — Telehealth: Payer: Self-pay | Admitting: *Deleted

## 2014-09-18 ENCOUNTER — Inpatient Hospital Stay: Admission: RE | Admit: 2014-09-18 | Payer: Medicare Other | Source: Ambulatory Visit

## 2014-09-18 NOTE — Telephone Encounter (Signed)
Returned call, Pt will complete Levaquin today.  Made OV for 09/20/14 to recheck INR post abx.

## 2014-09-18 NOTE — Telephone Encounter (Signed)
Patient had INR checked at [facility] 4.2 skip dose of medicine Friday night. When does she need to come back?

## 2014-09-20 ENCOUNTER — Ambulatory Visit (INDEPENDENT_AMBULATORY_CARE_PROVIDER_SITE_OTHER): Payer: Medicare Other

## 2014-09-20 DIAGNOSIS — G459 Transient cerebral ischemic attack, unspecified: Secondary | ICD-10-CM

## 2014-09-20 DIAGNOSIS — I4891 Unspecified atrial fibrillation: Secondary | ICD-10-CM

## 2014-09-20 DIAGNOSIS — I359 Nonrheumatic aortic valve disorder, unspecified: Secondary | ICD-10-CM

## 2014-09-20 DIAGNOSIS — Z5181 Encounter for therapeutic drug level monitoring: Secondary | ICD-10-CM

## 2014-09-20 LAB — POCT INR: INR: 2.8

## 2014-09-22 ENCOUNTER — Other Ambulatory Visit: Payer: Self-pay | Admitting: *Deleted

## 2014-09-22 MED ORDER — ATENOLOL 100 MG PO TABS: 50.0000 mg | ORAL_TABLET | Freq: Every day | ORAL | Status: DC

## 2014-10-10 ENCOUNTER — Other Ambulatory Visit: Payer: Self-pay

## 2014-10-10 MED ORDER — WARFARIN SODIUM 5 MG PO TABS: ORAL_TABLET | ORAL | Status: DC

## 2014-10-17 ENCOUNTER — Encounter: Payer: Self-pay | Admitting: Surgery

## 2014-10-18 ENCOUNTER — Ambulatory Visit (INDEPENDENT_AMBULATORY_CARE_PROVIDER_SITE_OTHER): Payer: Medicare Other

## 2014-10-18 DIAGNOSIS — I4891 Unspecified atrial fibrillation: Secondary | ICD-10-CM

## 2014-10-18 DIAGNOSIS — I359 Nonrheumatic aortic valve disorder, unspecified: Secondary | ICD-10-CM

## 2014-10-18 DIAGNOSIS — G459 Transient cerebral ischemic attack, unspecified: Secondary | ICD-10-CM

## 2014-10-18 DIAGNOSIS — Z5181 Encounter for therapeutic drug level monitoring: Secondary | ICD-10-CM

## 2014-10-18 LAB — POCT INR: INR: 1.4

## 2014-10-25 ENCOUNTER — Ambulatory Visit (INDEPENDENT_AMBULATORY_CARE_PROVIDER_SITE_OTHER): Payer: Medicare Other

## 2014-10-25 DIAGNOSIS — G459 Transient cerebral ischemic attack, unspecified: Secondary | ICD-10-CM

## 2014-10-25 DIAGNOSIS — Z5181 Encounter for therapeutic drug level monitoring: Secondary | ICD-10-CM

## 2014-10-25 DIAGNOSIS — I359 Nonrheumatic aortic valve disorder, unspecified: Secondary | ICD-10-CM

## 2014-10-25 DIAGNOSIS — I4891 Unspecified atrial fibrillation: Secondary | ICD-10-CM

## 2014-10-25 LAB — POCT INR: INR: 1.9

## 2014-10-31 ENCOUNTER — Ambulatory Visit (INDEPENDENT_AMBULATORY_CARE_PROVIDER_SITE_OTHER): Payer: Medicare Other

## 2014-10-31 ENCOUNTER — Encounter: Payer: Self-pay | Admitting: Cardiovascular Disease

## 2014-10-31 ENCOUNTER — Ambulatory Visit (INDEPENDENT_AMBULATORY_CARE_PROVIDER_SITE_OTHER): Payer: Medicare Other | Admitting: Cardiovascular Disease

## 2014-10-31 VITALS — BP 120/60 | HR 71 | Ht 63.0 in | Wt 150.0 lb

## 2014-10-31 DIAGNOSIS — Z953 Presence of xenogenic heart valve: Secondary | ICD-10-CM

## 2014-10-31 DIAGNOSIS — I4891 Unspecified atrial fibrillation: Secondary | ICD-10-CM

## 2014-10-31 DIAGNOSIS — I208 Other forms of angina pectoris: Secondary | ICD-10-CM

## 2014-10-31 DIAGNOSIS — R42 Dizziness and giddiness: Secondary | ICD-10-CM

## 2014-10-31 DIAGNOSIS — G459 Transient cerebral ischemic attack, unspecified: Secondary | ICD-10-CM

## 2014-10-31 DIAGNOSIS — I359 Nonrheumatic aortic valve disorder, unspecified: Secondary | ICD-10-CM

## 2014-10-31 DIAGNOSIS — Z5181 Encounter for therapeutic drug level monitoring: Secondary | ICD-10-CM

## 2014-10-31 DIAGNOSIS — J189 Pneumonia, unspecified organism: Secondary | ICD-10-CM

## 2014-10-31 DIAGNOSIS — R079 Chest pain, unspecified: Secondary | ICD-10-CM

## 2014-10-31 DIAGNOSIS — Z954 Presence of other heart-valve replacement: Secondary | ICD-10-CM

## 2014-10-31 DIAGNOSIS — I270 Primary pulmonary hypertension: Secondary | ICD-10-CM

## 2014-10-31 DIAGNOSIS — I272 Pulmonary hypertension, unspecified: Secondary | ICD-10-CM

## 2014-10-31 DIAGNOSIS — R0602 Shortness of breath: Secondary | ICD-10-CM

## 2014-10-31 LAB — POCT INR: INR: 2.8

## 2014-10-31 MED ORDER — TORSEMIDE 20 MG PO TABS: 10.0000 mg | ORAL_TABLET | Freq: Every day | ORAL | Status: DC | PRN

## 2014-10-31 NOTE — Progress Notes (Signed)
Patient ID: [name], female    DOB: 07-Mar-1922, 80 y.o.   MRN: [id number]  HPI Comments: 80 yo WF with history of tachy-brady syndrome s/p pacemaker, Permanent atrial fibrillation on warfarin, HTN, AVR, AAA with repair 4-5 years ago, and chronic diastolic heart failure,  right brachial arterial stenosis, With TIA in 2012,  presenting for routine followup. No significant coronary artery disease or carotid arterial disease Echocardiogram in 2012 showing moderate MR and TR with moderate pulmonary hypertension She has significant peripheral vascular disease, followed by Dr. Donnetta Hutching.  She presents today for follow-up of her PAD, atrial fibrillation  In follow-up she reports that she had pneumonia in October 2015. She was very weak following this and has required physical therapy She reports having chronic knee pain Has some type of skin cancer on the top of her left foot which is very painful. Has been very slow to heal following biopsy. Reports having dizziness if she stands up too quickly. She is taking HCTZ daily, torsemide several days per week Some shortness of breath but is not very active at baseline  EKG shows paced rhythm with rate 71 bpm  Other past medical history History of severe COPD exacerbation, bronchitis. She was on Z-Pak, prednisone, started on albuterol inhaler She took several days of diuretics, torsemide,  with mild weight loss now down to 140 pounds. No significant edema. Symptoms improved  Previous Evaluation of her pacemaker report shows she is 60% RV paced  admitted to the hospital at [facility] on Apr 16 2011 with shortness of breath, acute on chronic diastolic heart failure, underlying hypertension.  possible  pneumonia. White blood cell count was 13. Antibiotics were discontinued after her chest x-ray cleared.  Numerous skin cancers over the past several years, on her legs, face, arms requiring resection History of arthritis in her knees requiring cortisone  shot  cardiac catheterization over 5 years ago showing no significant coronary artery disease, recent CT scan showing stable descending aorta and after repair, minimal dilatation of the descending aorta, stable-appearing aortic valve.   Allergies  Allergen Reactions  . Fluticasone-Salmeterol   . Furosemide   . Iodine   . Iohexol      Desc: patient requires a 13 hr prep, when doing exam with contrast,,,,,fever; chills; dyspnea(slg/04/27/09--given 2 hour solumedrol/ benadryl prep--no reaction)   . Latex   . Penicillins   . Sulfonamide Derivatives   . Verapamil     Outpatient Encounter Prescriptions as of 10/31/2014  Medication Sig  . atenolol (TENORMIN) 100 MG tablet Take 0.5 tablets (50 mg total) by mouth at bedtime. Take an extra 1/2 tablet for fast heart beat.  [name] LORazepam (ATIVAN) 0.5 MG tablet Take 0.5 mg by mouth 3 (three) times daily as needed.    . Multiple Vitamins-Minerals (EYE VITAMINS) CAPS Take by mouth daily.    . pantoprazole (PROTONIX) 40 MG tablet Take 40 mg by mouth daily.  . potassium chloride (K-DUR) 10 MEQ tablet Take 1 tablet (10 mEq total) by mouth daily.  [name] torsemide (DEMADEX) 20 MG tablet Take 0.5 tablets (10 mg total) by mouth daily as needed.  . traMADol (ULTRAM) 50 MG tablet Take by mouth every 6 (six) hours as needed.  . traZODone (DESYREL) 50 MG tablet Take 50 mg by mouth at bedtime.   . triamterene-hydrochlorothiazide (MAXZIDE-25) 37.5-25 MG per tablet Take 1 tablet by mouth daily.  [name] warfarin (COUMADIN) 5 MG tablet Take as directed by anticoagulation clinic  . [DISCONTINUED] torsemide (DEMADEX) 20 MG  tablet Take 0.5 tablets (10 mg total) by mouth daily.  . [DISCONTINUED] doxycycline (DORYX) 100 MG DR capsule Take 100 mg by mouth 2 (two) times daily.  . [DISCONTINUED] mirabegron ER (MYRBETRIQ) 25 MG TB24 tablet Take 25 mg by mouth daily.    Past Medical History  Diagnosis Date  . Permanent atrial fibrillation     a. Chronic coumadin.  . Tachy-brady  syndrome     a. Severe in the setting of permanent atrial fibrillation with tachybrady syndrome;  b. 08/2007 s/p MDT Adapta ADSR01 DC PPM.  . S/P placement of cardiac pacemaker     a.  08/2007 s/p MDT [place] PPM.  . Brain aneurysm     Hx of  . Hypertension   . Thoracic aneurysm     a. 10/2011 CTA: 5.0 x 3.8 cm.  [name] AAA (abdominal aortic aneurysm)     a. 12/2011 U/S: 3.3 cm prox AAA.  . S/P aortic valve replacement with bioprosthetic valve     a. [facility].  [name] Aortic insufficiency     a. S/P bioprosthetic AVR;  b. 02/2013 Echo: EF 50-55%, mild to mod AI (mean grad 33mmHg).  . Chronic diastolic CHF (congestive heart failure)     a. 02/2013 Echo: EF 50-55%.  . Diverticulosis   . Hemorrhoids   . Gastritis   . Hepatitis B 1980's  . Skin cancer     face, ear  and left hand.  . Seizures   . Moderate mitral regurgitation     a. 02/2013 Echo: Mod MR.  . Moderate tricuspid regurgitation     a. 02/2013 Echo: Mod TR.  [name] Pulmonary hypertension     a. 02/2013 Echo: PASP 67mmHg.  . Chest pain     a. H/o nonobs cath;  b. 07/2013 Lexiscan MV: EF 66%, no ischemia.    Past Surgical History  Procedure Laterality Date  . Cerebral aneurysm repair    . Abdominal aortic aneurysm repair      Resection  . Aortic valve replacement      With bioprosthetic valve  . Pacemaker placement  08/2007    Medtronic; AF permanent; s/p pacemaker for bradycardia with now well controlled ventricular response; Digoxin therapy, question level; Lower extremity venous issues  . Breast biopsy    . Cholecystectomy    . Vesicovaginal fistula closure w/ tah    . Appendectomy    . Lung biopsy  2005  . Skin cancer removal      ear, face and hand    Social History  reports that she has never smoked. She does not have any smokeless tobacco history on file. She reports that she does not drink alcohol or use illicit drugs.  Family History family history includes AAA (abdominal aortic aneurysm) in her sister;  Cancer in her mother; Heart disease in her brother, father, mother, and sister; Hypertension in her brother, father, mother, and sister; Varicose Veins in her mother and sister.   Review of Systems  Constitutional: Negative.   Respiratory: Negative.   Cardiovascular: Negative.   Gastrointestinal: Negative.   Musculoskeletal: Positive for arthralgias and gait problem.  Skin: Negative.        Healing wound on the top of her left foot, painful to touch  Neurological: Negative.   Hematological: Negative.   Psychiatric/Behavioral: Negative.   All other systems reviewed and are negative.   BP 120/60 mmHg  Pulse 71  Ht 5\' 3"  (1.6 m)  Wt 150 lb (68.04 kg)  BMI 26.58 kg/m2  Physical Exam  Constitutional: She is oriented to person, place, and time. She appears well-developed and well-nourished.  Presenting today in a wheelchair  HENT:  Head: Normocephalic.  Nose: Nose normal.  Mouth/Throat: Oropharynx is clear and moist.  Eyes: Conjunctivae are normal. Pupils are equal, round, and reactive to light.  Neck: Normal range of motion. Neck supple. No JVD present.  Cardiovascular: Normal rate, regular rhythm, S1 normal, S2 normal, normal heart sounds and intact distal pulses.  Exam reveals no gallop and no friction rub.   No murmur heard. Pulmonary/Chest: Effort normal. No respiratory distress. She has decreased breath sounds in the left lower field. She has no wheezes. She has no rales. She exhibits no tenderness.  Abdominal: Soft. Bowel sounds are normal. She exhibits no distension. There is no tenderness.  Musculoskeletal: Normal range of motion. She exhibits no edema or tenderness.  Lymphadenopathy:    She has no cervical adenopathy.  Neurological: She is alert and oriented to person, place, and time. Coordination normal.  Skin: Skin is warm and dry. No rash noted. No erythema.  Psychiatric: She has a normal mood and affect. Her behavior is normal. Judgment and thought content normal.     Assessment and Plan  Nursing note and vitals reviewed.

## 2014-10-31 NOTE — Patient Instructions (Signed)
You are doing well.  Please cut the triamtere HCTZ in 1/2 daily Try to take the torsemide only for leg swelling If dizziness does not improve call the office  If you have worsening shortness of breath, call the office for a chest xray  Please call us if you have new issues that need to be addressed before your next appt.  Your physician wants you to follow-up in: 3 months.  You will receive a reminder letter in the mail two months in advance. If you don't receive a letter, please call our office to schedule the follow-up appointment.

## 2014-11-01 DIAGNOSIS — J189 Pneumonia, unspecified organism: Secondary | ICD-10-CM | POA: Insufficient documentation

## 2014-11-01 DIAGNOSIS — R42 Dizziness and giddiness: Secondary | ICD-10-CM | POA: Insufficient documentation

## 2014-11-01 NOTE — Assessment & Plan Note (Signed)
Currently with no symptoms of angina. No further workup at this time. Continue current medication regimen. 

## 2014-11-01 NOTE — Assessment & Plan Note (Signed)
History of periodic pneumonia. Last in October 2015. Still very weak, working with rehabilitation

## 2014-11-01 NOTE — Assessment & Plan Note (Signed)
Echocardiogram April 2014 with moderate pulmonary hypertension. Has been doing well since then on frequent torsemide. Now with orthostasis type symptoms. Will try to decrease her diuretics as she is not drinking much

## 2014-11-01 NOTE — Assessment & Plan Note (Signed)
Currently on anticoagulation. Paced rhythm 

## 2014-11-01 NOTE — Assessment & Plan Note (Signed)
Today presenting with symptoms concerning for orthostasis. Seems to happen when she stands, has dizziness for short period of time. Unable to exclude mild dehydration or low blood pressure. Recommended she only take torsemide as needed for leg edema. Also would cut her triamterene HCTZ in half. Blood pressure is well controlled today

## 2014-11-01 NOTE — Assessment & Plan Note (Signed)
Repeat echocardiogram in 2016. Last in April 2014

## 2014-11-14 ENCOUNTER — Telehealth: Payer: Self-pay | Admitting: Cardiovascular Disease

## 2014-11-14 NOTE — Telephone Encounter (Signed)
Pt c/o medication issue:  1. Name of Medication: Methylprednisolone benzonatate azithromycin  2. How are you currently taking this medication (dosage and times per day)?  Yes   3. Are you having a reaction (difficulty breathing--STAT)?  Just got is yesterday so not sure yet  4. What is your medication issue?  Pt went to PCP and was diagnosed she has  brachiatus She is just letting us know and does not know which may hurt the coumadin. [place]. Please advise

## 2014-11-14 NOTE — Telephone Encounter (Signed)
Pt would like to know if her meds will interfere w/ her coumadin.  Please advise.  Thank you.

## 2014-11-14 NOTE — Telephone Encounter (Signed)
Spoke with pt and she states started taking Methylprednisolone yesterday (4mg  tablet dose pack) and has taken Benzonatate for cough and Zpak as has been diagnosed with bronchitis. Instructed to continue to take her coumadin as ordered as well as these medications as ordered and to take these meds to her coumadin clinic appt in [facility] tomorrow and she states understanding.

## 2014-11-15 ENCOUNTER — Ambulatory Visit (INDEPENDENT_AMBULATORY_CARE_PROVIDER_SITE_OTHER): Payer: Medicare Other

## 2014-11-15 DIAGNOSIS — I4891 Unspecified atrial fibrillation: Secondary | ICD-10-CM

## 2014-11-15 DIAGNOSIS — I359 Nonrheumatic aortic valve disorder, unspecified: Secondary | ICD-10-CM

## 2014-11-15 DIAGNOSIS — Z954 Presence of other heart-valve replacement: Secondary | ICD-10-CM

## 2014-11-15 DIAGNOSIS — G459 Transient cerebral ischemic attack, unspecified: Secondary | ICD-10-CM

## 2014-11-15 DIAGNOSIS — Z5181 Encounter for therapeutic drug level monitoring: Secondary | ICD-10-CM

## 2014-11-15 DIAGNOSIS — Z953 Presence of xenogenic heart valve: Secondary | ICD-10-CM

## 2014-11-15 LAB — POCT INR
INR: 4.4
INR: 4.4

## 2014-11-27 ENCOUNTER — Other Ambulatory Visit: Payer: Self-pay | Admitting: *Deleted

## 2014-11-27 MED ORDER — POTASSIUM CHLORIDE ER 10 MEQ PO TBCR: 10.0000 meq | EXTENDED_RELEASE_TABLET | Freq: Every day | ORAL | Status: DC

## 2014-11-29 ENCOUNTER — Ambulatory Visit (INDEPENDENT_AMBULATORY_CARE_PROVIDER_SITE_OTHER): Payer: Medicare Other

## 2014-11-29 DIAGNOSIS — G459 Transient cerebral ischemic attack, unspecified: Secondary | ICD-10-CM

## 2014-11-29 DIAGNOSIS — I4891 Unspecified atrial fibrillation: Secondary | ICD-10-CM

## 2014-11-29 DIAGNOSIS — Z5181 Encounter for therapeutic drug level monitoring: Secondary | ICD-10-CM

## 2014-11-29 DIAGNOSIS — Z954 Presence of other heart-valve replacement: Secondary | ICD-10-CM

## 2014-11-29 DIAGNOSIS — I359 Nonrheumatic aortic valve disorder, unspecified: Secondary | ICD-10-CM

## 2014-11-29 DIAGNOSIS — Z953 Presence of xenogenic heart valve: Secondary | ICD-10-CM

## 2014-11-29 LAB — POCT INR: INR: 2.3

## 2014-12-27 ENCOUNTER — Ambulatory Visit (INDEPENDENT_AMBULATORY_CARE_PROVIDER_SITE_OTHER): Payer: Medicare Other | Admitting: Pharmacist

## 2014-12-27 ENCOUNTER — Ambulatory Visit (INDEPENDENT_AMBULATORY_CARE_PROVIDER_SITE_OTHER): Payer: Medicare Other | Admitting: *Deleted

## 2014-12-27 DIAGNOSIS — R001 Bradycardia, unspecified: Secondary | ICD-10-CM

## 2014-12-27 DIAGNOSIS — I359 Nonrheumatic aortic valve disorder, unspecified: Secondary | ICD-10-CM

## 2014-12-27 DIAGNOSIS — G459 Transient cerebral ischemic attack, unspecified: Secondary | ICD-10-CM

## 2014-12-27 DIAGNOSIS — Z5181 Encounter for therapeutic drug level monitoring: Secondary | ICD-10-CM

## 2014-12-27 DIAGNOSIS — I4891 Unspecified atrial fibrillation: Secondary | ICD-10-CM

## 2014-12-27 DIAGNOSIS — Z953 Presence of xenogenic heart valve: Secondary | ICD-10-CM

## 2014-12-27 DIAGNOSIS — Z954 Presence of other heart-valve replacement: Secondary | ICD-10-CM

## 2014-12-27 LAB — MDC_IDC_ENUM_SESS_TYPE_INCLINIC
Battery Remaining Longevity: 24 mo
Battery Voltage: 2.72 V
Brady Statistic RV Percent Paced: 97 %
Date Time Interrogation Session: 20160210122500
Lead Channel Impedance Value: 0 Ohm
Lead Channel Pacing Threshold Pulse Width: 0.4 ms
MDC IDC MSMT BATTERY IMPEDANCE: 2692 Ohm
MDC IDC MSMT LEADCHNL RV IMPEDANCE VALUE: 590 Ohm
MDC IDC MSMT LEADCHNL RV PACING THRESHOLD AMPLITUDE: 1 V
MDC IDC SET LEADCHNL RV PACING AMPLITUDE: 2 V
MDC IDC SET LEADCHNL RV PACING PULSEWIDTH: 0.4 ms
MDC IDC SET LEADCHNL RV SENSING SENSITIVITY: 5.6 mV

## 2014-12-27 LAB — POCT INR: INR: 2.8

## 2014-12-27 NOTE — Progress Notes (Signed)
PPM check in office. 

## 2014-12-27 NOTE — Patient Instructions (Signed)
Send your [facility] Transmission anytime 03/28/15.   Call the office in June to schedule an appointment with Dr. Caryl Comes for an August Pacemaker check.

## 2015-01-03 ENCOUNTER — Ambulatory Visit (INDEPENDENT_AMBULATORY_CARE_PROVIDER_SITE_OTHER): Payer: Medicare Other

## 2015-01-03 ENCOUNTER — Telehealth: Payer: Self-pay | Admitting: Anesthesiology

## 2015-01-03 DIAGNOSIS — Z954 Presence of other heart-valve replacement: Secondary | ICD-10-CM

## 2015-01-03 DIAGNOSIS — Z953 Presence of xenogenic heart valve: Secondary | ICD-10-CM

## 2015-01-03 DIAGNOSIS — G459 Transient cerebral ischemic attack, unspecified: Secondary | ICD-10-CM

## 2015-01-03 DIAGNOSIS — I4891 Unspecified atrial fibrillation: Secondary | ICD-10-CM

## 2015-01-03 DIAGNOSIS — I359 Nonrheumatic aortic valve disorder, unspecified: Secondary | ICD-10-CM

## 2015-01-03 DIAGNOSIS — Z5181 Encounter for therapeutic drug level monitoring: Secondary | ICD-10-CM

## 2015-01-03 LAB — POCT INR: INR: 2.5

## 2015-01-03 NOTE — Telephone Encounter (Signed)
Patient came in today for coumadin check and wanted to speak with Eddie Dibbles about a part that was ordered.  Please call Patient.

## 2015-01-18 ENCOUNTER — Encounter: Payer: Self-pay | Admitting: Internal Medicine

## 2015-01-24 ENCOUNTER — Ambulatory Visit (INDEPENDENT_AMBULATORY_CARE_PROVIDER_SITE_OTHER): Payer: Medicare Other

## 2015-01-24 DIAGNOSIS — G459 Transient cerebral ischemic attack, unspecified: Secondary | ICD-10-CM | POA: Diagnosis not present

## 2015-01-24 DIAGNOSIS — Z5181 Encounter for therapeutic drug level monitoring: Secondary | ICD-10-CM

## 2015-01-24 DIAGNOSIS — I4891 Unspecified atrial fibrillation: Secondary | ICD-10-CM | POA: Diagnosis not present

## 2015-01-24 DIAGNOSIS — Z954 Presence of other heart-valve replacement: Secondary | ICD-10-CM

## 2015-01-24 DIAGNOSIS — Z953 Presence of xenogenic heart valve: Secondary | ICD-10-CM

## 2015-01-24 DIAGNOSIS — I359 Nonrheumatic aortic valve disorder, unspecified: Secondary | ICD-10-CM

## 2015-01-24 LAB — POCT INR: INR: 2

## 2015-01-30 ENCOUNTER — Ambulatory Visit (INDEPENDENT_AMBULATORY_CARE_PROVIDER_SITE_OTHER): Payer: Medicare Other | Admitting: Cardiovascular Disease

## 2015-01-30 ENCOUNTER — Encounter: Payer: Self-pay | Admitting: Cardiovascular Disease

## 2015-01-30 VITALS — BP 110/64 | HR 63 | Ht 63.0 in | Wt 148.0 lb

## 2015-01-30 DIAGNOSIS — Z954 Presence of other heart-valve replacement: Secondary | ICD-10-CM

## 2015-01-30 DIAGNOSIS — I714 Abdominal aortic aneurysm, without rupture, unspecified: Secondary | ICD-10-CM

## 2015-01-30 DIAGNOSIS — R000 Tachycardia, unspecified: Secondary | ICD-10-CM

## 2015-01-30 DIAGNOSIS — R42 Dizziness and giddiness: Secondary | ICD-10-CM

## 2015-01-30 DIAGNOSIS — I5032 Chronic diastolic (congestive) heart failure: Secondary | ICD-10-CM

## 2015-01-30 DIAGNOSIS — Z953 Presence of xenogenic heart valve: Secondary | ICD-10-CM

## 2015-01-30 DIAGNOSIS — I4891 Unspecified atrial fibrillation: Secondary | ICD-10-CM

## 2015-01-30 DIAGNOSIS — Z952 Presence of prosthetic heart valve: Secondary | ICD-10-CM

## 2015-01-30 DIAGNOSIS — R0602 Shortness of breath: Secondary | ICD-10-CM

## 2015-01-30 NOTE — Assessment & Plan Note (Signed)
Chronic issues with dizziness with standing. Recommended she decrease the triamterene HCTZ down to one half pill daily. Blood pressure is on the low side

## 2015-01-30 NOTE — Assessment & Plan Note (Signed)
Echocardiogram ordered for prominent murmur. Last echocardiogram 2014. We'll evaluate her prosthetic aortic valve

## 2015-01-30 NOTE — Patient Instructions (Addendum)
You are doing well. No medication changes were made.  We will schedule an echocardiogram for AVR (prosthetic valve Also aorta ultrasound for known descending aorta aneurysm)  Please cut the triamterene-HCTZ in 1/2   Please call us if you have new issues that need to be addressed before your next appt.  Your physician wants you to follow-up in: 6 months.  You will receive a reminder letter in the mail two months in advance. If you don't receive a letter, please call our office to schedule the follow-up appointment.

## 2015-01-30 NOTE — Assessment & Plan Note (Signed)
Abdominal aortic aneurysm ordered. Previously seen in [place] for her PAD. Has not seen Dr. Donnetta Hutching since 2012

## 2015-01-30 NOTE — Assessment & Plan Note (Signed)
Weight over the past several months has been stable. Appears euvolemic on today's visit. Some orthostasis symptoms. Again recommended she decrease her triamterene HCTZ in half daily

## 2015-01-30 NOTE — Assessment & Plan Note (Signed)
Currently on anticoagulation. Paced rhythm

## 2015-01-30 NOTE — Progress Notes (Signed)
Patient ID: [name], female    DOB: Jan 19, 1922, 81 y.o.   MRN: [id number]  HPI Comments: 81 yo WF with history of tachy-brady syndrome s/p pacemaker, Permanent atrial fibrillation on warfarin, HTN, AVR, AAA with repair 4-5 years ago, and chronic diastolic heart failure,  right brachial arterial stenosis, With TIA in 2012,  presenting for routine followup. No significant coronary artery disease or carotid arterial disease Echocardiogram in 2012 showing moderate MR and TR with moderate pulmonary hypertension She has significant peripheral vascular disease, followed by Dr. Donnetta Hutching.  She presents today for follow-up of her PAD, atrial fibrillation  In follow-up today she reports having bronchitis infections and other upper respiratory infections over the past several months requiring courses of antibiotics. She is doing well until 3 days ago, Sunday, when she started developing throat congestion, nasal congestion, postnasal drip, general achy, malaise, cough.  Recovering from shingles on her left leg  she continues to have dizziness if she stands up quickly. Previously we cut the dose of her triamterene HCTZ lower. She continues to take a full pill daily  EKG on today's visit shows paced rhythm at rate 63 bpm  Other past medical history pneumonia in October 2015.   chronic knee pain Some shortness of breath but is not very active at baseline  History of severe COPD exacerbation, bronchitis. She was on Z-Pak, prednisone, started on albuterol inhaler She took several days of diuretics, torsemide,  with mild weight loss now down to 140 pounds. No significant edema. Symptoms improved  Previous Evaluation of her pacemaker report shows she is 60% RV paced  admitted to the hospital at [facility] on Apr 16 2011 with shortness of breath, acute on chronic diastolic heart failure, underlying hypertension.  possible  pneumonia. White blood cell count was 13. Antibiotics were discontinued after her chest  x-ray cleared.  Numerous skin cancers over the past several years, on her legs, face, arms requiring resection History of arthritis in her knees requiring cortisone shot  cardiac catheterization over 5 years ago showing no significant coronary artery disease, recent CT scan showing stable descending aorta and after repair, minimal dilatation of the descending aorta, stable-appearing aortic valve.   Allergies  Allergen Reactions  . Fluticasone-Salmeterol   . Furosemide   . Iodine   . Iohexol      Desc: patient requires a 13 hr prep, when doing exam with contrast,,,,,fever; chills; dyspnea(slg/04/27/09--given 2 hour solumedrol/ benadryl prep--no reaction)   . Latex   . Penicillins   . Sulfonamide Derivatives   . Verapamil     Outpatient Encounter Prescriptions as of 01/30/2015  Medication Sig  . atenolol (TENORMIN) 100 MG tablet Take 0.5 tablets (50 mg total) by mouth at bedtime. Take an extra 1/2 tablet for fast heart beat.  . budesonide-formoterol (SYMBICORT) 160-4.5 MCG/ACT inhaler Inhale 2 puffs into the lungs 2 (two) times daily.  . bumetanide (BUMEX) 0.5 MG tablet Take 0.5 mg by mouth daily.  [name] LORazepam (ATIVAN) 0.5 MG tablet Take 0.5 mg by mouth 3 (three) times daily as needed.    . methylPREDNISolone (MEDROL) 4 MG tablet Take 4 mg by mouth daily. As directed  . Multiple Vitamins-Minerals (EYE VITAMINS) CAPS Take by mouth daily.    . pantoprazole (PROTONIX) 40 MG tablet Take 40 mg by mouth daily.  . potassium chloride (K-DUR) 10 MEQ tablet Take 1 tablet (10 mEq total) by mouth daily.  [name] torsemide (DEMADEX) 20 MG tablet Take 0.5 tablets (10 mg total) by mouth  daily as needed.  . traMADol (ULTRAM) 50 MG tablet Take by mouth every 6 (six) hours as needed.  . traZODone (DESYREL) 50 MG tablet Take 50 mg by mouth at bedtime.   . triamterene-hydrochlorothiazide (MAXZIDE-25) 37.5-25 MG per tablet Take 1 tablet by mouth daily.  [name] warfarin (COUMADIN) 5 MG tablet Take as directed by  anticoagulation clinic  . [DISCONTINUED] azithromycin (ZITHROMAX) 250 MG tablet Take 250 mg by mouth daily.    Past Medical History  Diagnosis Date  . Permanent atrial fibrillation     a. Chronic coumadin.  . Tachy-brady syndrome     a. Severe in the setting of permanent atrial fibrillation with tachybrady syndrome;  b. 08/2007 s/p MDT Adapta ADSR01 DC PPM.  . S/P placement of cardiac pacemaker     a.  08/2007 s/p MDT [place] PPM.  . Brain aneurysm     Hx of  . Hypertension   . Thoracic aneurysm     a. 10/2011 CTA: 5.0 x 3.8 cm.  [name] AAA (abdominal aortic aneurysm)     a. 12/2011 U/S: 3.3 cm prox AAA.  . S/P aortic valve replacement with bioprosthetic valve     a. [facility].  [name] Aortic insufficiency     a. S/P bioprosthetic AVR;  b. 02/2013 Echo: EF 50-55%, mild to mod AI (mean grad 29mmHg).  . Chronic diastolic CHF (congestive heart failure)     a. 02/2013 Echo: EF 50-55%.  . Diverticulosis   . Hemorrhoids   . Gastritis   . Hepatitis B 1980's  . Skin cancer     face, ear  and left hand.  . Seizures   . Moderate mitral regurgitation     a. 02/2013 Echo: Mod MR.  . Moderate tricuspid regurgitation     a. 02/2013 Echo: Mod TR.  [name] Pulmonary hypertension     a. 02/2013 Echo: PASP 47mmHg.  . Chest pain     a. H/o nonobs cath;  b. 07/2013 Lexiscan MV: EF 66%, no ischemia.  . Shingles     Past Surgical History  Procedure Laterality Date  . Cerebral aneurysm repair    . Abdominal aortic aneurysm repair      Resection  . Aortic valve replacement      With bioprosthetic valve  . Pacemaker placement  08/2007    Medtr[employer]; AF permanent; s/p pacemaker for bradycardia with now well controlled ventricular response; Digoxin therapy, question level; Lower extremity venous issues  . Breast biopsy    . Cholecystectomy    . Vesicovaginal fistula closure w/ tah    . Appendectomy    . Lung biopsy  2005  . Skin cancer removal      ear, face and hand    Social History   reports that she has never smoked. She does not have any smokeless tobacco history on file. She reports that she does not drink alcohol or use illicit drugs.  Family History family history includes AAA (abdominal aortic aneurysm) in her sister; Cancer in her mother; Heart disease in her brother, father, mother, and sister; Hypertension in her brother, father, mother, and sister; Varicose Veins in her mother and sister.   Review of Systems  Constitutional: Negative.   Respiratory: Negative.   Cardiovascular: Negative.   Gastrointestinal: Negative.   Musculoskeletal: Positive for arthralgias and gait problem.  Skin: Negative.        Healing wound on the top of her left foot, painful to touch  Neurological: Negative.  Hematological: Negative.   Psychiatric/Behavioral: Negative.   All other systems reviewed and are negative.   BP 110/64 mmHg  Pulse 63  Ht 5\' 3"  (1.6 m)  Wt 148 lb (67.132 kg)  BMI 26.22 kg/m2  Physical Exam  Constitutional: She is oriented to person, place, and time. She appears well-developed and well-nourished.  Presenting today in a wheelchair  HENT:  Head: Normocephalic.  Nose: Nose normal.  Mouth/Throat: Oropharynx is clear and moist.  Eyes: Conjunctivae are normal. Pupils are equal, round, and reactive to light.  Neck: Normal range of motion. Neck supple. No JVD present.  Cardiovascular: Normal rate, regular rhythm, S1 normal, S2 normal, normal heart sounds and intact distal pulses.  Exam reveals no gallop and no friction rub.   No murmur heard. Pulmonary/Chest: Effort normal. No respiratory distress. She has decreased breath sounds. She has no wheezes. She has no rales. She exhibits no tenderness.  Abdominal: Soft. Bowel sounds are normal. She exhibits no distension. There is no tenderness.  Musculoskeletal: Normal range of motion. She exhibits no edema or tenderness.  Lymphadenopathy:    She has no cervical adenopathy.  Neurological: She is alert and  oriented to person, place, and time. Coordination normal.  Skin: Skin is warm and dry. No rash noted. No erythema.  Psychiatric: She has a normal mood and affect. Her behavior is normal. Judgment and thought content normal.    Assessment and Plan  Nursing note and vitals reviewed.

## 2015-02-06 ENCOUNTER — Encounter
Admit: 2015-02-06 | Disposition: A | Discharge status: Home / Self Care | Payer: Self-pay | Attending: Surgery | Admitting: Surgery

## 2015-02-09 ENCOUNTER — Other Ambulatory Visit: Payer: Medicare Other

## 2015-02-09 ENCOUNTER — Encounter (INDEPENDENT_AMBULATORY_CARE_PROVIDER_SITE_OTHER): Payer: Medicare Other

## 2015-02-09 DIAGNOSIS — I714 Abdominal aortic aneurysm, without rupture, unspecified: Secondary | ICD-10-CM

## 2015-02-09 DIAGNOSIS — I700 Atherosclerosis of aorta: Secondary | ICD-10-CM

## 2015-02-14 ENCOUNTER — Ambulatory Visit (INDEPENDENT_AMBULATORY_CARE_PROVIDER_SITE_OTHER): Payer: Medicare Other

## 2015-02-14 DIAGNOSIS — Z5181 Encounter for therapeutic drug level monitoring: Secondary | ICD-10-CM

## 2015-02-14 DIAGNOSIS — I359 Nonrheumatic aortic valve disorder, unspecified: Secondary | ICD-10-CM | POA: Diagnosis not present

## 2015-02-14 DIAGNOSIS — G459 Transient cerebral ischemic attack, unspecified: Secondary | ICD-10-CM | POA: Diagnosis not present

## 2015-02-14 DIAGNOSIS — I4891 Unspecified atrial fibrillation: Secondary | ICD-10-CM

## 2015-02-14 DIAGNOSIS — Z953 Presence of xenogenic heart valve: Secondary | ICD-10-CM

## 2015-02-14 DIAGNOSIS — Z954 Presence of other heart-valve replacement: Secondary | ICD-10-CM

## 2015-02-14 LAB — POCT INR: INR: 2.7

## 2015-02-16 ENCOUNTER — Encounter
Admit: 2015-02-16 | Disposition: A | Discharge status: Home / Self Care | Payer: Self-pay | Attending: Surgery | Admitting: Surgery

## 2015-02-19 ENCOUNTER — Other Ambulatory Visit (INDEPENDENT_AMBULATORY_CARE_PROVIDER_SITE_OTHER): Payer: Medicare Other

## 2015-02-19 ENCOUNTER — Other Ambulatory Visit: Payer: Self-pay

## 2015-02-19 DIAGNOSIS — R0602 Shortness of breath: Secondary | ICD-10-CM | POA: Diagnosis not present

## 2015-02-19 DIAGNOSIS — I714 Abdominal aortic aneurysm, without rupture, unspecified: Secondary | ICD-10-CM

## 2015-02-19 DIAGNOSIS — R000 Tachycardia, unspecified: Secondary | ICD-10-CM | POA: Diagnosis not present

## 2015-02-19 DIAGNOSIS — I4891 Unspecified atrial fibrillation: Secondary | ICD-10-CM | POA: Diagnosis not present

## 2015-02-19 DIAGNOSIS — Z952 Presence of prosthetic heart valve: Secondary | ICD-10-CM

## 2015-03-07 ENCOUNTER — Telehealth: Payer: Self-pay

## 2015-03-07 ENCOUNTER — Other Ambulatory Visit: Payer: Self-pay | Admitting: Cardiovascular Disease

## 2015-03-07 NOTE — Telephone Encounter (Signed)
Spoke w/ Joelene Millin.  She reports that at last ov, pt was advised to cut triamterene-HCTZ in 1/2 but this is a capsule.  To try to make up for it, they have been adjusting pt's atenolol dosage. Reports that pt feels lousy. She would like to know if a smaller dose of the triamterene-HCTZ can be sent in or if Dr. Rockey Situ has any other recommendations.  Please advise.  Thank you.

## 2015-03-07 NOTE — Telephone Encounter (Signed)
Spoke w/ Joelene Millin.  Advised her of Dr. Donivan Scull recommendation.  She is appreciative and will call back w/ any questions or concerns.

## 2015-03-07 NOTE — Telephone Encounter (Signed)
Pt niece has a questions regarding pt meds, states pt is experiencing nausea, and dizziness. Please call.

## 2015-03-07 NOTE — Telephone Encounter (Signed)
Would continue on her regular dose atenolol For a trial period, stop the triamterene HCTZ and monitor her blood pressure and see how she feels  If symptoms improve, blood pressure within a reasonable range, no new medications needed If blood pressure runs high would call the office Perhaps we could use very low-dose HCTZ alone without triamterene

## 2015-03-10 NOTE — Consult Note (Signed)
PATIENT NAME:  [name], [name] MR#:  [id number] DATE OF BIRTH:  08/03/1922  DATE OF CONSULTATION:  09/15/2014  REFERRING PHYSICIAN:  Debbrah Alar, MD from Emergency Room.  CONSULTING PHYSICIAN:  Bee Marchiano R. Amarri Michaelson, MD  REASON FOR CONSULTATION: Left lower lobe pneumonia with weakness.   HISTORY OF PRESENT ILLNESS: A 81 year old Caucasian female patient with history of aortic valve replacement on Coumadin along with atrial fibrillation, who presents to the Emergency Room with cough and weakness and some shortness of breath. The patient mentions that she had her symptoms initially start with a cold about 10 days back. This progressively worsening, causing chest congestion. Was seen by Dr. Venetia Maxon. Tejan-Sie in his office on 09/12/2014. A chest x-ray was done which showed left lower lobe pneumonia, was started on Levaquin. The patient has been taking those medications. She does mention that she was having significant cough previously which has resolved today. Her brown sputum has cleared up. She is afebrile, normal white count, but presented to the Emergency Room, as she was having weakness and her symptoms have not resolved completely. At this point, the patient feels better since starting the antibiotics. Her saturations are 94% on room air and 96% on ambulation without any shortness of breath. She does not have any PND, orthopnea. No edema. No sick contacts. Is not on any steroids.   PAST MEDICAL HISTORY:  1.  CAD.  2.  Aortic aneurysm.  3.  Aortic valve replacement. 4.  Atrial fibrillation.  5.  Skin cancer.  6.  Anxiety.  7.  Cyst of left kidney.  8.  Acid reflux.  9.  Hypertension.  10.  MRSA foot infection in the past.   PAST SURGICAL HISTORY: Pacemaker placement, aortic valve replacement, aneurysm of the chest repair, hysterectomy, appendectomy, lung nodule biopsy.   ALLERGIES: IODINE, PENICILLIN, AND SULFA.   HOME MEDICATIONS:  1.  Atenolol 100 mg 1/2 tablet 2 times a day.  2.   Cheratussin AC 5 mL every 6 hours as needed.  3.  Hydrochlorothiazide and triamterene 25/37.5 mg oral once a day.  4.  Levaquin 5 mg oral once a day for 7 days, started on 09/12/2014.  5.  Lorazepam 0.5 mg oral 3 times a day as needed for anxiety. 6.  Protonix 40 mg daily.  7.  Potassium chloride 10 mEq daily.  8.  Torsemide 20 mg 1/2 tablet daily.  9.  Tramadol 50 mg oral every 6 hours as needed for pain.  10.  Trazodone 50 mg oral once a day at bedtime.  11.  Warfarin 2.5 mg daily except on Mondays, when she takes 5 mg.   SOCIAL HISTORY: The patient does not smoke. No alcohol. No illicit drugs. Ambulates on her own. Lives with her husband. Does not wear home oxygen.   CODE STATUS: Full code.  FAMILY HISTORY: Mother died of bone cancer. Father had complications from a test.   REVIEW OF SYSTEMS:  CONSTITUTIONAL: Complains of some fatigue. No weight loss, weight gain.  EYES: No blurred vision, pain, redness.  ENT: No tinnitus, ear pain, hearing loss.  RESPIRATORY: Has cough, clear sputum, some shortness of breath.  GASTROINTESTINAL: No nausea, vomiting, diarrhea, abdominal pain.  GENITOURINARY: No dysuria, hematuria, frequency.  ENDOCRINE: No polyuria, nocturia, thyroid problems. HEMATOLOGIC AND LYMPHATIC: No anemia, easy bruising, bleeding.  INTEGUMENTARY: No acne, rash, lesion.  MUSCULOSKELETAL: No back pain or arthritis.  NEUROLOGIC: No focal numbness, weakness, seizure.  PSYCHIATRIC: No anxiety or depression.   PHYSICAL EXAMINATION:  Shows:  VITAL SIGNS: Temperature 97.4, pulse of 66, respirations 17, blood pressure 144/65, saturating 96% on room air.  GENERAL: Elderly Caucasian female patient lying in bed, seems comfortable, conversational, cooperative with exam. No conversational dyspnea.  PSYCHIATRIC: Alert and oriented x 3, pleasant.  HEENT: Atraumatic, normocephalic. Oral mucosa moist and pink. External ears and nose normal. No pallor. No icterus. Pupils bilaterally equal  and reactive to light.  NECK: Supple. No thyromegaly. No palpable lymph nodes. Trachea midline. No carotid bruit, JVD.  CARDIOVASCULAR: S1, S2, without any murmurs. Peripheral pulses 2+. No edema.  RESPIRATORY: Normal work of breathing. Clear to auscultation on both sides with decreased air entry at the left base.  GASTROINTESTINAL: Soft abdomen, nontender. Bowel sounds present. No organomegaly palpable.  SKIN: Warm and dry. No petechiae, rash, ulcers.  MUSCULOSKELETAL: No joint swelling, redness, effusion of the large joints. Normal muscle tone.  NEUROLOGICAL: Motor strength 5/5 in upper and lower extremities. Sensory and reflexes intact all over.  LYMPHATIC: No cervical lymphadenopathy.   LABORATORY STUDIES: Glucose 105, BUN 27, creatinine 0.94, sodium 139, potassium 3.7, chloride 98, bicarbonate 33, GFR greater than 60. Troponin less than 0.02. WBC 7.1, hemoglobin 13.3, platelets of 224,000 with INR of 4.4.   IMAGING: Chest x-ray shows left lower lobe infiltrate, possibly mild pleural effusion.   ASSESSMENT AND PLAN: 1.  Left lower lobe pneumonia with synpneumonic effusion. The patient, per her history, seems to be improving with cough resolved. The minimal sputum has cleared out from color. The patient has ambulated well in the hallway, did not feel any shortness of breath. Her saturations were 96% on exertion on room air. On further discussing with the patient, she feels like she can go home, finish her antibiotic course. As she is afebrile, normal white count, the patient will be discharged home. I have discussed this plan with Dr. Archie Balboa with the Emergency Room, who will be discharging the patient home. I did advise that patient should call Dr. Elijio Miles or return to the Emergency Room if her symptoms worsen in any ways.  2.  Atrial fibrillation with being on Coumadin. The patient's INR is elevated at 4.4, likely secondary from the Coumadin. I have advised her to hold Coumadin today and  resume from tomorrow. Needs PT, INR check with her primary care physician in 3-4 days.  3.  Other comorbidities seem stable at this point.   TIME SPENT: The total time spent on this consult was 65 minutes.   ____________________________ Leia Alf Lachele Lievanos, MD srs:ST D: 09/15/2014 16:53:07 ET T: 09/16/2014 01:28:37 ET JOB#: [id number]  cc: Alveta Heimlich R. Jarek Longton, MD, <Dictator> Sheikh A. Elijio Miles, MD Neita Carp MD ELECTRONICALLY SIGNED 09/21/2014 14:58

## 2015-03-14 ENCOUNTER — Ambulatory Visit (INDEPENDENT_AMBULATORY_CARE_PROVIDER_SITE_OTHER): Payer: Medicare Other | Admitting: *Deleted

## 2015-03-14 DIAGNOSIS — Z5181 Encounter for therapeutic drug level monitoring: Secondary | ICD-10-CM | POA: Diagnosis not present

## 2015-03-14 DIAGNOSIS — G459 Transient cerebral ischemic attack, unspecified: Secondary | ICD-10-CM | POA: Diagnosis not present

## 2015-03-14 DIAGNOSIS — Z954 Presence of other heart-valve replacement: Secondary | ICD-10-CM

## 2015-03-14 DIAGNOSIS — I4891 Unspecified atrial fibrillation: Secondary | ICD-10-CM | POA: Diagnosis not present

## 2015-03-14 DIAGNOSIS — Z953 Presence of xenogenic heart valve: Secondary | ICD-10-CM

## 2015-03-14 DIAGNOSIS — I359 Nonrheumatic aortic valve disorder, unspecified: Secondary | ICD-10-CM | POA: Diagnosis not present

## 2015-03-14 LAB — POCT INR: INR: 2.6

## 2015-03-16 ENCOUNTER — Telehealth: Payer: Self-pay | Admitting: *Deleted

## 2015-03-16 NOTE — Telephone Encounter (Signed)
Spoke w/ Joelene Millin.  Advised her of Dr. Donivan Scull recommendation.  She verbalizes understanding and will call back w/ any further questions or concerns.

## 2015-03-16 NOTE — Telephone Encounter (Signed)
Pt niece is calling asking about medication triamterene She has gained 7 pounds since this.  Wants to know if she should try and take fluid pill everyday instead of every other day.  And she is only take half a fluid pill.  Please advise.

## 2015-03-16 NOTE — Telephone Encounter (Signed)
For weight gain, worsening leg edema, would take the torsemide as needed

## 2015-03-16 NOTE — Telephone Encounter (Signed)
Jonette Eva at 03/16/2015 9:38 AM     Status: Signed       Expand All Collapse All   Pt niece is calling asking about medication triamterene She has gained 7 pounds since this.  Wants to know if she should try and take fluid pill everyday instead of every other day.  And she is only take half a fluid pill.  Please advise.

## 2015-03-16 NOTE — Telephone Encounter (Signed)
Please see previous phone note.  

## 2015-03-19 ENCOUNTER — Telehealth: Payer: Self-pay | Admitting: *Deleted

## 2015-03-19 ENCOUNTER — Encounter: Payer: Medicare Other | Attending: Surgery | Admitting: Surgery

## 2015-03-19 DIAGNOSIS — Z950 Presence of cardiac pacemaker: Secondary | ICD-10-CM | POA: Insufficient documentation

## 2015-03-19 DIAGNOSIS — I70248 Atherosclerosis of native arteries of left leg with ulceration of other part of lower left leg: Secondary | ICD-10-CM | POA: Insufficient documentation

## 2015-03-19 DIAGNOSIS — I509 Heart failure, unspecified: Secondary | ICD-10-CM | POA: Insufficient documentation

## 2015-03-19 DIAGNOSIS — Z8673 Personal history of transient ischemic attack (TIA), and cerebral infarction without residual deficits: Secondary | ICD-10-CM | POA: Diagnosis not present

## 2015-03-19 DIAGNOSIS — L97222 Non-pressure chronic ulcer of left calf with fat layer exposed: Secondary | ICD-10-CM | POA: Insufficient documentation

## 2015-03-19 DIAGNOSIS — L02425 Furuncle of right lower limb: Secondary | ICD-10-CM | POA: Insufficient documentation

## 2015-03-19 DIAGNOSIS — L02426 Furuncle of left lower limb: Secondary | ICD-10-CM | POA: Insufficient documentation

## 2015-03-19 DIAGNOSIS — R600 Localized edema: Secondary | ICD-10-CM | POA: Diagnosis not present

## 2015-03-19 NOTE — Telephone Encounter (Signed)
Pt niece stating that pt Gained fluid weight and was to to take half a pill  Only lost one pound since then  Today BP is high 166/88  And she is not sure what needs to be done.  Please advise.

## 2015-03-19 NOTE — Telephone Encounter (Signed)
Left message for [name] advising her of instructions per our last conversation for pt to take a whole torsemide as needed.  Asked her to call back w/ any questions.

## 2015-03-20 NOTE — Progress Notes (Signed)
ACELYN, BASHAM ([id number]) Visit Report for 03/19/2015 Chief Complaint Document Details Patient Name: [name], [name]. Date of Service: 03/19/2015 3:15 PM Medical Record Number: [id number] Patient Account Number: [id number] Date of Birth/Sex: [birth year] (81 y.o. Female) Treating RN: Primary Care Physician: Pernell Dupre Other Clinician: Referring Physician: Pernell Dupre Treating Physician/Extender: Frann Rider in Treatment: 5 Information Obtained from: Patient Chief Complaint Patient returns to the wound care center for new ulcer to: left lower extremity lateral part of the calf. This has been there for about a month. Electronic Signature(s) Signed: 03/19/2015 4:43:42 PM By: Christin Fudge MD, FACS Entered By: Christin Fudge on 03/19/2015 16:26:13 [name] ([id number]) -------------------------------------------------------------------------------- HPI Details Patient Name: [name] Date of Service: 03/19/2015 3:15 PM Medical Record Number: [id number] Patient Account Number: [id number] Date of Birth/Sex: 25-Oct-1922 (81 y.o. Female) Treating RN: Primary Care Physician: Pernell Dupre Other Clinician: Referring Physician: Pernell Dupre Treating Physician/Extender: Frann Rider in Treatment: 5 History of Present Illness HPI Description: The patient is a pleasant 81 year old with a past medical history significant for aortic valve replacement (on Coumadin), pacemaker, CHF, abdominal aortic aneurysm, and CVA. No history of diabetes or peripheral vascular disease. She underwent excision of a squamous cell cancer on her right upper arm and left dorsal foot in early August 2015 at the [facility]. Shave biopsy on May 11, 2014 showed "superficially invasive squamous cell carcinoma". She subsequently underwent wide excision with clear margins. She presented to the wound clinic with full thickness open surgical wounds. No  symptoms to suggest ischemic rest pain or claudication. ABI 1.1. Cultures of L foot on August 09, 2014 grew ORSA sensitive to tigecycline. She has completed a course of doxycycline. 02/06/2015. -- This time around she had multiple furuncles over her left lower extremity and some on her right too. Was treated by her primary care physician with doxycycline and also was treated for shingles. Most of these ulcerations had healed primarily but the one on her left lower extremity lateral part is open and has continued to drain for about a month. She has minimal pain over this area. She has been using some local ointment and a dressing has been put over this. Last year she had a full vascular workup at [facility] in [place] and saw Dr. Donnetta Hutching for an opinion regarding arterial insufficiency. though she had significant arterial insufficiency a major surgical procedure was not recommended because of her age. Conservative therapy was recommended. The patient has had some vascular vein surgery done about 6 years ago in this hospital. Electronic Signature(s) Signed: 03/19/2015 4:43:42 PM By: Christin Fudge MD, FACS Entered By: Christin Fudge on 03/19/2015 16:26:18 [name] ([id number]) -------------------------------------------------------------------------------- Physical Exam Details Patient Name: [name] Date of Service: 03/19/2015 3:15 PM Medical Record Number: [id number] Patient Account Number: [id number] Date of Birth/Sex: [birth year] (81 y.o. Female) Treating RN: Primary Care Physician: Pernell Dupre Other Clinician: Referring Physician: Pernell Dupre Treating Physician/Extender: Frann Rider in Treatment: 5 Constitutional . Pulse regular. Respirations normal and unlabored. Afebrile. . Eyes Nonicteric. Reactive to light. Ears, Nose, Mouth, and Throat Lips, teeth, and gums WNL.[name] Moist mucosa without lesions . Neck supple and nontender. No palpable  supraclavicular or cervical adenopathy. Normal sized without goiter. Respiratory WNL. No retractions.. Cardiovascular Pedal Pulses WNL. No clubbing, cyanosis or edema. Integumentary (Hair, Skin) the wound is looking very clean, filling up with granulation tissue and the edges are healing.[name] Psychiatric Judgement and insight Intact.. No evidence of depression, anxiety,  or agitation.. Electronic Signature(s) Signed: 03/19/2015 4:43:42 PM By: Christin Fudge MD, FACS Entered By: Christin Fudge on 03/19/2015 16:27:16 [name] ([id number]) -------------------------------------------------------------------------------- Physician Orders Details Patient Name: [name] Date of Service: 03/19/2015 3:15 PM Medical Record Number: [id number] Patient Account Number: [id number] Date of Birth/Sex: Mar 09, 1922 (81 y.o. Female) Treating RN: Junious Dresser Primary Care Physician: Pernell Dupre Other Clinician: Referring Physician: Pernell Dupre Treating Physician/Extender: Frann Rider in Treatment: 5 Verbal / Phone Orders: Yes Clinician: Junious Dresser Read Back and Verified: Yes Diagnosis Coding Wound Cleansing Wound #3 Left,Posterior Lower Leg o Clean wound with Normal Saline. o Cleanse wound with mild soap and water o May Shower, gently pat wound dry prior to applying new dressing. o May shower with protection. Anesthetic Wound #3 Left,Posterior Lower Leg o Topical Lidocaine 4% cream applied to wound bed prior to debridement Primary Wound Dressing Wound #3 Left,Posterior Lower Leg o Prisma Ag Secondary Dressing Wound #3 Left,Posterior Lower Leg o Boardered Foam Dressing Dressing Change Frequency Wound #3 Left,Posterior Lower Leg o Change dressing every other day. Electronic Signature(s) Signed: 03/19/2015 4:43:42 PM By: Christin Fudge MD, FACS Signed: 03/19/2015 5:06:07 PM By: Junious Dresser RN Entered By: Junious Dresser on 03/19/2015  16:06:47 [name] ([id number]) -------------------------------------------------------------------------------- Problem List Details Patient Name: [name] Date of Service: 03/19/2015 3:15 PM Medical Record Number: [id number] Patient Account Number: [id number] Date of Birth/Sex: [birth year] (81 y.o. 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/06/2015 Yes exposed L02.426 Furuncle of left lower limb 02/06/2015 Yes L02.425 Furuncle of right lower limb 02/06/2015 Yes I70.248 Atherosclerosis of native arteries of left leg with ulceration 02/06/2015 Yes of other part of lower left leg Inactive Problems Resolved Problems Electronic Signature(s) Signed: 03/19/2015 4:43:42 PM By: Christin Fudge MD, FACS Entered By: Christin Fudge on 03/19/2015 16:26:05 [name] ([id number]) -------------------------------------------------------------------------------- Progress Note Details Patient Name: [name] Date of Service: 03/19/2015 3:15 PM Medical Record Number: [id number] Patient Account Number: [id number] Date of Birth/Sex: 18-Oct-1922 (81 y.o. Female) Treating RN: Primary Care Physician: Pernell Dupre Other Clinician: Referring Physician: Pernell Dupre Treating Physician/Extender: Frann Rider in Treatment: 5 Subjective Chief Complaint Information obtained from Patient Patient returns to the wound care center for new ulcer to: left lower extremity lateral part of the calf. This has been there for about a month. History of Present Illness (HPI) The patient is a pleasant 81 year old with a past medical history significant for aortic valve replacement (on Coumadin),  pacemaker, CHF, abdominal aortic aneurysm, and CVA. No history of diabetes or peripheral vascular disease. She underwent excision of a squamous cell cancer on her right upper arm and left dorsal foot in early August 2015 at the [facility]. Shave biopsy on May 11, 2014 showed "superficially invasive squamous cell carcinoma". She subsequently underwent wide excision with clear margins. She presented to the wound clinic with full thickness open surgical wounds. No symptoms to suggest ischemic rest pain or claudication. ABI 1.1. Cultures of L foot on August 09, 2014 grew ORSA sensitive to tigecycline. She has completed a course of doxycycline. 02/06/2015. -- This time around she had multiple furuncles over her left lower extremity and some on her right too. Was treated by her primary care physician with doxycycline and also was treated for shingles. Most  of these ulcerations had healed primarily but the one on her left lower extremity lateral part is open and has continued to drain for about a month. She has minimal pain over this area. She has been using some local ointment and a dressing has been put over this. Last year she had a full vascular workup at [facility] in [place] and saw Dr. Donnetta Hutching for an opinion regarding arterial insufficiency. though she had significant arterial insufficiency a major surgical procedure was not recommended because of her age. Conservative therapy was recommended. The patient has had some vascular vein surgery done about 6 years ago in this hospital. Objective Constitutional Pulse regular. Respirations normal and unlabored. Afebrile. TABYTHA, GRADILLAS ([id number]) Vitals Time Taken: 3:40 PM, Height: 63 in, Weight: 148 lbs, BMI: 26.2, Temperature: 97.6 F, Pulse: 86 bpm, Respiratory Rate: 20 breaths/min, Blood Pressure: 161/88 mmHg. General Notes: CM notified of elevated BP and pt is asymptomatic. Pt c/o of indigestion this am, but no  c/o presently. Pt took her BP medication this morning. Eyes Nonicteric. Reactive to light. Ears, Nose, Mouth, and Throat Lips, teeth, and gums WNL.[name] Moist mucosa without lesions . Neck supple and nontender. No palpable supraclavicular or cervical adenopathy. Normal sized without goiter. Respiratory WNL. No retractions.. Cardiovascular Pedal Pulses WNL. No clubbing, cyanosis or edema. Psychiatric Judgement and insight Intact.. No evidence of depression, anxiety, or agitation.. Integumentary (Hair, Skin) the wound is looking very clean, filling up with granulation tissue and the edges are healing.. Wound #3 status is Open. Original cause of wound was Gradually Appeared. The wound is located on the Left,Posterior Lower Leg. The wound measures 1.7cm length x 1.3cm width x 0.2cm depth; 1.736cm^2 area and 0.347cm^3 volume. The wound is limited to skin breakdown. There is no tunneling or undermining noted. There is a medium amount of serosanguineous drainage noted. The wound margin is indistinct and nonvisible. There is large (67-100%) red granulation within the wound bed. There is a small (1-33%) amount of necrotic tissue within the wound bed including Adherent Slough. The periwound skin appearance exhibited: Localized Edema, Moist, Erythema. The periwound skin appearance did not exhibit: Callus, Crepitus, Excoriation, Fluctuance, Friable, Induration, Rash, Scarring, Dry/Scaly, Maceration, Atrophie Blanche, Cyanosis, Ecchymosis, Hemosiderin Staining, Mottled, Pallor, Rubor. The surrounding wound skin color is noted with erythema which is circumferential. Periwound temperature was noted as No Abnormality. The periwound has tenderness on palpation. Assessment Active Problems ICD-10 L97.222 - Non-pressure chronic ulcer of left calf with fat layer exposed KIMELA, MALSTROM ([id number]) L02.426 - Furuncle of left lower limb L02.425 - Furuncle of right lower limb I70.248 - Atherosclerosis of  native arteries of left leg with ulceration of other part of lower left leg Procedures After cleaning the wound nicely with saline and using a little bit of abrasion on the granulation tissue there is brisk bleeding and healthy wound edges. We will continue with local dressing using Prisma AG and a border. Plan Wound Cleansing: Wound #3 Left,Posterior Lower Leg: Clean wound with Normal Saline. Cleanse wound with mild soap and water May Shower, gently pat wound dry prior to applying new dressing. May shower with protection. Anesthetic: Wound #3 Left,Posterior Lower Leg: Topical Lidocaine 4% cream applied to wound bed prior to debridement Primary Wound Dressing: Wound #3 Left,Posterior Lower Leg: Prisma Ag Secondary Dressing: Wound #3 Left,Posterior Lower Leg: Boardered Foam Dressing Dressing Change Frequency: Wound #3 Left,Posterior Lower Leg: Change dressing every other day. We will continue with local dressing using Prisma AG and a border. She will  come back and see as next week. HARRY, BARK ([id number]) Electronic Signature(s) Signed: 03/19/2015 4:43:42 PM By: Christin Fudge MD, FACS Entered By: Christin Fudge on 03/19/2015 24:81:85

## 2015-03-20 NOTE — Progress Notes (Signed)
[name], [name] ([id number]) Visit Report for 03/19/2015 Arrival Information Details Patient Name: [name], [name] Date of Service: 03/19/2015 3:15 PM Medical Record Number: [id number] Patient Account Number: [id number] Date of Birth/Sex: September 19, 1922 (81 y.o. Female) Treating RN: Junious Dresser Primary Care Physician: Pernell Dupre Other Clinician: Referring Physician: Pernell Dupre Treating Physician/Extender: Frann Rider in Treatment: 5 Visit Information History Since Last Visit Added or deleted any medications: No Patient Arrived: Wheel Chair Any new allergies or adverse reactions: No Arrival Time: 15:35 Had a fall or experienced change in No Accompanied By: wheelchair activities of daily living that may affect Transfer Assistance: Manual risk of falls: Patient Identification Verified: Yes Signs or symptoms of abuse/neglect since last No Secondary Verification Yes visito Process Completed: Hospitalized since last visit: No Patient Requires No Has Dressing in Place as Prescribed: Yes Transmission-Based Pain Present Now: No Precautions: Patient Has Alerts: Yes Patient Alerts: Patient on Blood Thinner ABI,L:1.10,R:1.10 Electronic Signature(s) Signed: 03/19/2015 5:06:07 PM By: Junious Dresser RN Entered By: Junious Dresser on 03/19/2015 15:41:52 [name] ([id number]) -------------------------------------------------------------------------------- Clinic Level of Care Assessment Details Patient Name: [name] Date of Service: 03/19/2015 3:15 PM Medical Record Number: [id number] Patient Account Number: [id number] Date of Birth/Sex: [birth year] (81 y.o. Female) Treating RN: Junious Dresser Primary Care Physician: Pernell Dupre Other Clinician: Referring Physician: Pernell Dupre Treating Physician/Extender: Frann Rider in Treatment: 5 Clinic Level of Care Assessment Items TOOL 4 Quantity Score []  - Use when only an EandM is  performed on FOLLOW-UP visit 0 ASSESSMENTS - Nursing Assessment / Reassessment []  - Reassessment of Co-morbidities (includes updates in patient status) 0 []  - Reassessment of Adherence to Treatment Plan 0 ASSESSMENTS - Wound and Skin Assessment / Reassessment X - Simple Wound Assessment / Reassessment - one wound 1 5 []  - Complex Wound Assessment / Reassessment - multiple wounds 0 []  - Dermatologic / Skin Assessment (not related to wound area) 0 ASSESSMENTS - Focused Assessment X - Circumferential Edema Measurements - multi extremities 1 5 []  - Nutritional Assessment / Counseling / Intervention 0 X - Lower Extremity Assessment (monofilament, tuning fork, pulses) 1 5 []  - Peripheral Arterial Disease Assessment (using hand held doppler) 0 ASSESSMENTS - Ostomy and/or Continence Assessment and Care []  - Incontinence Assessment and Management 0 []  - Ostomy Care Assessment and Management (repouching, etc.) 0 PROCESS - Coordination of Care X - Simple Patient / Family Education for ongoing care 1 15 []  - Complex (extensive) Patient / Family Education for ongoing care 0 X - Staff obtains Programmer, systems, Records, Test Results / Process Orders 1 10 []  - Staff telephones HHA, Nursing Homes / Clarify orders / etc 0 []  - Routine Transfer to another Facility (non-emergent condition) 0 [name], [name] ([id number]) []  - Routine Hospital Admission (non-emergent condition) 0 []  - New Admissions / Biomedical engineer / Ordering NPWT, Apligraf, etc. 0 []  - Emergency Hospital Admission (emergent condition) 0 X - Simple Discharge Coordination 1 10 []  - Complex (extensive) Discharge Coordination 0 PROCESS - Special Needs []  - Pediatric / Minor Patient Management 0 []  - Isolation Patient Management 0 []  - Hearing / Language / Visual special needs 0 []  - Assessment of Community assistance (transportation, D/C planning, etc.) 0 []  - Additional assistance / Altered mentation 0 []  - Support Surface(s)  Assessment (bed, cushion, seat, etc.) 0 INTERVENTIONS - Wound Cleansing / Measurement X - Simple Wound Cleansing - one wound 1 5 []  - Complex Wound Cleansing - multiple wounds 0 X - Wound Imaging (photographs -  any number of wounds) 1 5 []  - Wound Tracing (instead of photographs) 0 X - Simple Wound Measurement - one wound 1 5 []  - Complex Wound Measurement - multiple wounds 0 INTERVENTIONS - Wound Dressings X - Small Wound Dressing one or multiple wounds 1 10 []  - Medium Wound Dressing one or multiple wounds 0 []  - Large Wound Dressing one or multiple wounds 0 []  - Application of Medications - topical 0 []  - Application of Medications - injection 0 INTERVENTIONS - Miscellaneous []  - External ear exam 0 [name], [name] ([id number]) []  - Specimen Collection (cultures, biopsies, blood, body fluids, etc.) 0 []  - Specimen(s) / Culture(s) sent or taken to Lab for analysis 0 []  - Patient Transfer (multiple staff / Harrel Lemon Lift / Similar devices) 0 []  - Simple Staple / Suture removal (25 or less) 0 []  - Complex Staple / Suture removal (26 or more) 0 []  - Hypo / Hyperglycemic Management (close monitor of Blood Glucose) 0 []  - Ankle / Brachial Index (ABI) - do not check if billed separately 0 X - Vital Signs 1 5 Has the patient been seen at the hospital within the last three years: Yes Total Score: 80 Level Of Care: New/Established - Level 3 Electronic Signature(s) Signed: 03/19/2015 5:06:07 PM By: Junious Dresser RN Entered By: Junious Dresser on 03/19/2015 16:07:43 [name] ([id number]) -------------------------------------------------------------------------------- Encounter Discharge Information Details Patient Name: [name] Date of Service: 03/19/2015 3:15 PM Medical Record Number: [id number] Patient Account Number: [id number] Date of Birth/Sex: 11-Dec-1921 (81 y.o. Female) Treating RN: Primary Care Physician: Pernell Dupre Other Clinician: Referring Physician:  Pernell Dupre Treating Physician/Extender: Frann Rider in Treatment: 5 Encounter Discharge Information Items Schedule Follow-up Appointment: No Medication Reconciliation completed No and provided to Patient/Care Lashell Moffitt: Provided on Clinical Summary of Care: 03/19/2015 Form Type Recipient Paper Patient JW Electronic Signature(s) Signed: 03/19/2015 4:16:07 PM By: Ruthine Dose Entered By: Ruthine Dose on 03/19/2015 16:16:06 [name] ([id number]) -------------------------------------------------------------------------------- General Visit Notes Details Patient Name: [name] Date of Service: 03/19/2015 3:15 PM Medical Record Number: [id number] Patient Account Number: [id number] Date of Birth/Sex: Oct 18, 1922 (81 y.o. Female) Treating RN: Junious Dresser Primary Care Physician: Pernell Dupre Other Clinician: Referring Physician: Pernell Dupre Treating Physician/Extender: Frann Rider in Treatment: 5 Notes CM notified of elevated BP and pt is asymptomatic. Pt c/o of indigestion/gas this am, but no c/o presently. Pt took her BP medication this morning. Pt states she has been having indigestion/gas feeling in her chest (points to sternum) for about a week, which is associated with some pain in her shoulder and some chest pain when she takes a breath sometimes. Pt also c/o of some nausea over the last week. Dr. Con Memos is notified and pt is instructed to contact her PCP, pt's niece states they have contacted her cardiologist and are waiting for him to call them back, and hope to be seen today. Pt is instructed that if her symptoms intensify, she experiences intense pressure in her chest or difficulty breathing she should go to the ED. Pt and pt's niece verbalized understanding. Electronic Signature(s) Signed: 03/19/2015 5:06:07 PM By: Junious Dresser RN Entered By: Junious Dresser on 03/19/2015 16:18:23 [name]  ([id number]) -------------------------------------------------------------------------------- Lower Extremity Assessment Details Patient Name: [name] Date of Service: 03/19/2015 3:15 PM Medical Record Number: [id number] Patient Account Number: [id number] Date of Birth/Sex: 03-25-1922 (81 y.o. Female) Treating RN: Junious Dresser Primary Care Physician: Pernell Dupre Other Clinician: Referring Physician: Pernell Dupre Treating Physician/Extender:  Britto, Errol Weeks in Treatment: 5 Edema Assessment Assessed: [Left: Yes] [Right: No] Edema: [Left: Ye] [Right: s] Calf Left: Right: Point of Measurement: 32 cm From Medial Instep 37.4 cm cm Ankle Left: Right: Point of Measurement: 12 cm From Medial Instep 22 cm cm Vascular Assessment Claudication: Claudication Assessment [Left:None] Pulses: Posterior Tibial Palpable: [Left:Yes] Dorsalis Pedis Palpable: [Left:Yes] Extremity colors, hair growth, and conditions: Extremity Color: [Left:Hyperpigmented] Hair Growth on Extremity: [Left:Yes] Temperature of Extremity: [Left:Cool] Capillary Refill: [Left:< 3 seconds] Dependent Rubor: [Left:No] Blanched when Elevated: [Left:No] Toe Nail Assessment Left: Right: Thick: Yes Discolored: Yes Deformed: No Improper Length and Hygiene: No [name], [name] ([id number]) Electronic Signature(s) Signed: 03/19/2015 5:06:07 PM By: Junious Dresser RN Entered By: Junious Dresser on 03/19/2015 15:49:20 [name] ([id number]) -------------------------------------------------------------------------------- Multi Wound Chart Details Patient Name: [name] Date of Service: 03/19/2015 3:15 PM Medical Record Number: [id number] Patient Account Number: [id number] Date of Birth/Sex: [birth year] (81 y.o. Female) Treating RN: Junious Dresser Primary Care Physician: Pernell Dupre Other Clinician: Referring Physician: Pernell Dupre Treating Physician/Extender: Frann Rider in Treatment: 5 Vital Signs Height(in): 63 Pulse(bpm): 86 Weight(lbs): 148 Blood Pressure 161/88 (mmHg): Body Mass Index(BMI): 26 Temperature(F): 97.6 Respiratory Rate 20 (breaths/min): Photos: [3:No Photos] [N/A:N/A] Wound Location: [3:Left Lower Leg - Posterior N/A] Wounding Event: [3:Gradually Appeared] [N/A:N/A] Primary Etiology: [3:Infection - not elsewhere N/A classified] Comorbid History: [3:Cataracts, Angina, Arrhythmia, Congestive Heart Failure, Hypertension, Hepatitis B, Osteoarthritis, Seizure Disorder] [N/A:N/A] Date Acquired: [3:01/08/2015] [N/A:N/A] Weeks of Treatment: [3:5] [N/A:N/A] Wound Status: [3:Open] [N/A:N/A] Measurements L x W x D 1.7x1.3x0.2 [N/A:N/A] (cm) Area (cm) : [3:1.736] [N/A:N/A] Volume (cm) : [3:0.347] [N/A:N/A] % Reduction in Area: [3:55.80%] [N/A:N/A] % Reduction in Volume: 55.80% [N/A:N/A] Classification: [3:Full Thickness Without Exposed Support Structures] [N/A:N/A] Exudate Amount: [3:Medium] [N/A:N/A] Exudate Type: [3:Serosanguineous] [N/A:N/A] Exudate Color: [3:red, brown] [N/A:N/A] Wound Margin: [3:Indistinct, nonvisible] [N/A:N/A] Granulation Amount: [3:Large (67-100%)] [N/A:N/A] Granulation Quality: [3:Red] [N/A:N/A] Necrotic Amount: [3:Small (1-33%)] [N/A:N/A] Exposed Structures: Fascia: No N/A N/A Fat: No Tendon: No Muscle: No Joint: No Bone: No Limited to Skin Breakdown Epithelialization: Small (1-33%) N/A N/A Periwound Skin Texture: Edema: Yes N/A N/A Excoriation: No Induration: No Callus: No Crepitus: No Fluctuance: No Friable: No Rash: No Scarring: No Periwound Skin Moist: Yes N/A N/A Moisture: Maceration: No Dry/Scaly: No Periwound Skin Color: Erythema: Yes N/A N/A Atrophie Blanche: No Cyanosis: No Ecchymosis: No Hemosiderin Staining: No Mottled: No Pallor: No Rubor: No Erythema Location: Circumferential N/A N/A Temperature: No Abnormality N/A N/A Tenderness on Yes N/A  N/A Palpation: Wound Preparation: Ulcer Cleansing: N/A N/A Rinsed/Irrigated with Saline Topical Anesthetic Applied: Other: lidocaine 4% Treatment Notes Electronic Signature(s) Signed: 03/19/2015 5:06:07 PM By: Junious Dresser RN Entered By: Junious Dresser on 03/19/2015 15:56:56 [name] ([id number]) -------------------------------------------------------------------------------- [place] Details Patient Name: [name] Date of Service: 03/19/2015 3:15 PM Medical Record Number: [id number] Patient Account Number: [id number] Date of Birth/Sex: 07-09-1922 (81 y.o. Female) Treating RN: Junious Dresser Primary Care Physician: Pernell Dupre Other Clinician: Referring Physician: Pernell Dupre Treating Physician/Extender: Frann Rider in Treatment: 5 Active Inactive Abuse / Safety / Falls / Self Care Management Nursing Diagnoses: Impaired physical mobility Potential for falls Goals: Patient will remain injury free Date Initiated: 02/06/2015 Goal Status: Active Patient/caregiver will verbalize understanding of skin care regimen Date Initiated: 02/06/2015 Goal Status: Active Patient/caregiver will verbalize/demonstrate measures taken to prevent injury and/or falls Date Initiated: 02/06/2015 Goal Status: Active Interventions: Provide education on fall prevention Provide education on safe transfers Notes: Orientation to the  Wound Care Program Nursing Diagnoses: Knowledge deficit related to the wound healing center program Goals: Patient/caregiver will verbalize understanding of the [place] Program Date Initiated: 02/06/2015 Goal Status: Active Interventions: Provide education on orientation to the wound center Notes: [name], [name] ([id number]) Wound/Skin Impairment Nursing Diagnoses: Impaired tissue integrity Knowledge deficit related to ulceration/compromised skin integrity Goals: Patient/caregiver will verbalize  understanding of skin care regimen Date Initiated: 02/06/2015 Goal Status: Active Ulcer/skin breakdown will heal within 14 weeks Date Initiated: 02/06/2015 Goal Status: Active Interventions: Assess patient/caregiver ability to obtain necessary supplies Assess patient/caregiver ability to perform ulcer/skin care regimen upon admission and as needed Assess ulceration(s) every visit Provide education on ulcer and skin care Treatment Activities: Skin care regimen initiated : 03/19/2015 Topical wound management initiated : 03/19/2015 Notes: Electronic Signature(s) Signed: 03/19/2015 5:06:07 PM By: Junious Dresser RN Entered By: Junious Dresser on 03/19/2015 15:56:42 [name] ([id number]) -------------------------------------------------------------------------------- Pain Assessment Details Patient Name: [name] Date of Service: 03/19/2015 3:15 PM Medical Record Number: [id number] Patient Account Number: [id number] Date of Birth/Sex: November 26, 1921 (81 y.o. Female) Treating RN: Junious Dresser Primary Care Physician: Pernell Dupre Other Clinician: Referring Physician: Pernell Dupre Treating Physician/Extender: Frann Rider in Treatment: 5 Active Problems Location of Pain Severity and Description of Pain Patient Has Paino No Site Locations Pain Management and Medication Current Pain Management: Electronic Signature(s) Signed: 03/19/2015 5:06:07 PM By: Junious Dresser RN Entered By: Junious Dresser on 03/19/2015 15:42:50 [name] ([id number]) -------------------------------------------------------------------------------- Patient/Caregiver Education Details Patient Name: [name] Date of Service: 03/19/2015 3:15 PM Medical Record Number: [id number] Patient Account Number: [id number] Date of Birth/Gender: 1922/03/17 (81 y.o. Female) Treating RN: Junious Dresser Primary Care Physician: Pernell Dupre Other Clinician: Referring Physician: Pernell Dupre Treating Physician/Extender: Frann Rider in Treatment: 5 Education Assessment Education Provided To: Patient Education Topics Provided Safety: Methods: Demonstration Responses: State content correctly Wound/Skin Impairment: Methods: Explain/Verbal Responses: State content correctly Electronic Signature(s) Signed: 03/19/2015 5:06:07 PM By: Junious Dresser RN Entered By: Junious Dresser on 03/19/2015 16:10:04 [name] ([id number]) -------------------------------------------------------------------------------- Wound Assessment Details Patient Name: [name] Date of Service: 03/19/2015 3:15 PM Medical Record Number: [id number] Patient Account Number: [id number] Date of Birth/Sex: 08/03/1922 (81 y.o. Female) Treating RN: Junious Dresser Primary Care Physician: Pernell Dupre Other Clinician: Referring Physician: Pernell Dupre Treating Physician/Extender: Frann Rider in Treatment: 5 Wound Status Wound Number: 3 Primary Infection - not elsewhere classified Etiology: Wound Location: Left Lower Leg - Posterior Wound Open Wounding Event: Gradually Appeared Status: Date Acquired: 01/08/2015 Comorbid Cataracts, Angina, Arrhythmia, Weeks Of Treatment: 5 History: Congestive Heart Failure, Hypertension, Clustered Wound: No Hepatitis B, Osteoarthritis, Seizure Disorder Photos Photo Uploaded By: Junious Dresser on 03/19/2015 17:05:02 Wound Measurements Length: (cm) 1.7 Width: (cm) 1.3 Depth: (cm) 0.2 Area: (cm) 1.736 Volume: (cm) 0.347 % Reduction in Area: 55.8% % Reduction in Volume: 55.8% Epithelialization: Small (1-33%) Tunneling: No Undermining: No Wound Description Full Thickness Without Exposed Foul Odor A Classification: Support Structures Wound Margin: Indistinct, nonvisible Exudate Medium Amount: Exudate Type: Serosanguineous Exudate Color: red, brown fter Cleansing: No Wound Bed Granulation Amount: Large (67-100%)  Exposed Structure [name], [name] ([id number]) Granulation Quality: Red Fascia Exposed: No Necrotic Amount: Small (1-33%) Fat Layer Exposed: No Necrotic Quality: Adherent Slough Tendon Exposed: No Muscle Exposed: No Joint Exposed: No Bone Exposed: No Limited to Skin Breakdown Periwound Skin Texture Texture Color No Abnormalities Noted: No No Abnormalities Noted: No Callus: No Atrophie Blanche: No Crepitus: No Cyanosis: No Excoriation: No Ecchymosis: No  Fluctuance: No Erythema: Yes Friable: No Erythema Location: Circumferential Induration: No Hemosiderin Staining: No Localized Edema: Yes Mottled: No Rash: No Pallor: No Scarring: No Rubor: No Moisture Temperature / Pain No Abnormalities Noted: No Temperature: No Abnormality Dry / Scaly: No Tenderness on Palpation: Yes Maceration: No Moist: Yes Wound Preparation Ulcer Cleansing: Rinsed/Irrigated with Saline Topical Anesthetic Applied: Other: lidocaine 4%, Treatment Notes Wound #3 (Left, Posterior Lower Leg) 1. Cleansed with: Clean wound with Normal Saline 2. Anesthetic Topical Lidocaine 4% cream to wound bed prior to debridement 4. Dressing Applied: Prisma Ag 5. Secondary Dressing Applied Bordered Foam Dressing Electronic Signature(s) Signed: 03/19/2015 5:06:07 PM By: Junious Dresser RN Entered By: Junious Dresser on 03/19/2015 15:50:53 [name] ([id number]) -------------------------------------------------------------------------------- [place] Details Patient Name: [name] Date of Service: 03/19/2015 3:15 PM Medical Record Number: [id number] Patient Account Number: [id number] Date of Birth/Sex: May 15, 1922 (81 y.o. Female) Treating RN: Junious Dresser Primary Care Physician: Pernell Dupre Other Clinician: Referring Physician: Pernell Dupre Treating Physician/Extender: Frann Rider in Treatment: 5 Vital Signs Time Taken: 15:40 Temperature (F): 97.6 Height (in): 63 Pulse  (bpm): 86 Weight (lbs): 148 Respiratory Rate (breaths/min): 20 Body Mass Index (BMI): 26.2 Blood Pressure (mmHg): 161/88 Reference Range: 80 - 120 mg / dl Notes CM notified of elevated BP and pt is asymptomatic. Pt c/o of indigestion this am, but no c/o presently. Pt took her BP medication this morning. Electronic Signature(s) Signed: 03/19/2015 5:06:07 PM By: Junious Dresser RN Entered By: Junious Dresser on 03/19/2015 15:45:43

## 2015-03-24 ENCOUNTER — Telehealth: Payer: Self-pay | Admitting: Physician Assistant

## 2015-03-24 NOTE — Telephone Encounter (Addendum)
[name] called because Ms. Whitaker's weight was up 7 pounds earlier this week and she has increased her Demadex as directed to one whole tablet daily. Her weight initially dropped by 3 pounds, but she has gained back 1 pound and is also having some chest pain that is worse with deep inspiration. What should they do?  Advised her that there were 2 options, come to the emergency room or to increase the Demadex and potassium. At this time, she is not in acute respiratory distress and they prefer to increase the Demadex. Therefore, she is to take 2 Demadex tablets this morning, totaling 40 mg. She is also to take 2 potassium tablets, totaling 20 mEq.  She is to call back tomorrow and if she is improved, I will leave a message with the office for her to get lab work on Monday. If she is no better, will likely need evaluation and possible IV torsemide in the emergency room. Of note she is allergic to Lasix.  Lenoard Aden 03/24/2015 10:18 AM Beeper [phone]  05/08 Pt daughter called again, pt has taken the extra Lasix as directed and has urinated frequently, but lost no weight. Upon further questioning, pt has been drinking Gatorade.   Plan: Ms Castellanos does not wish to come to the ER now. Daughter thinks that is OK.  They will continue the increased Lasix dose and stop the Gatorade.   If she gets more SOB, she will come to the ER. Otherwise, will contact the office in am, will send a staff message also. She needs an office visit and lab work.  Rosaria Ferries, Hershal Coria 03/25/2015 11:50 AM Beeper [phone]

## 2015-03-26 ENCOUNTER — Other Ambulatory Visit: Payer: Self-pay | Admitting: *Deleted

## 2015-03-26 ENCOUNTER — Ambulatory Visit
Admission: RE | Admit: 2015-03-26 | Discharge: 2015-03-26 | Disposition: A | Discharge status: Home / Self Care | Payer: Medicare Other | Source: Ambulatory Visit | Attending: Cardiovascular Disease | Admitting: Cardiovascular Disease

## 2015-03-26 ENCOUNTER — Ambulatory Visit
Admission: RE | Admit: 2015-03-26 | Discharge: 2015-03-26 | Disposition: A | Discharge status: Home / Self Care | Payer: Medicare Other | Source: Ambulatory Visit | Attending: Physician Assistant | Admitting: Physician Assistant

## 2015-03-26 ENCOUNTER — Encounter: Payer: Self-pay | Admitting: Physician Assistant

## 2015-03-26 ENCOUNTER — Encounter: Payer: Medicare Other | Admitting: Surgery

## 2015-03-26 ENCOUNTER — Telehealth: Payer: Self-pay | Admitting: *Deleted

## 2015-03-26 ENCOUNTER — Ambulatory Visit (INDEPENDENT_AMBULATORY_CARE_PROVIDER_SITE_OTHER): Payer: Medicare Other | Admitting: Physician Assistant

## 2015-03-26 VITALS — BP 120/60 | HR 61 | Ht 63.0 in | Wt 148.8 lb

## 2015-03-26 DIAGNOSIS — R142 Eructation: Secondary | ICD-10-CM

## 2015-03-26 DIAGNOSIS — M25512 Pain in left shoulder: Secondary | ICD-10-CM

## 2015-03-26 DIAGNOSIS — R1013 Epigastric pain: Secondary | ICD-10-CM | POA: Diagnosis present

## 2015-03-26 DIAGNOSIS — I5032 Chronic diastolic (congestive) heart failure: Secondary | ICD-10-CM

## 2015-03-26 DIAGNOSIS — R079 Chest pain, unspecified: Secondary | ICD-10-CM

## 2015-03-26 DIAGNOSIS — Z953 Presence of xenogenic heart valve: Secondary | ICD-10-CM

## 2015-03-26 DIAGNOSIS — Z9889 Other specified postprocedural states: Secondary | ICD-10-CM

## 2015-03-26 DIAGNOSIS — Z8679 Personal history of other diseases of the circulatory system: Secondary | ICD-10-CM

## 2015-03-26 DIAGNOSIS — Z950 Presence of cardiac pacemaker: Secondary | ICD-10-CM

## 2015-03-26 DIAGNOSIS — J449 Chronic obstructive pulmonary disease, unspecified: Secondary | ICD-10-CM | POA: Diagnosis not present

## 2015-03-26 DIAGNOSIS — I495 Sick sinus syndrome: Secondary | ICD-10-CM

## 2015-03-26 DIAGNOSIS — I517 Cardiomegaly: Secondary | ICD-10-CM | POA: Insufficient documentation

## 2015-03-26 DIAGNOSIS — I714 Abdominal aortic aneurysm, without rupture: Secondary | ICD-10-CM | POA: Insufficient documentation

## 2015-03-26 DIAGNOSIS — Z954 Presence of other heart-valve replacement: Secondary | ICD-10-CM

## 2015-03-26 DIAGNOSIS — K5900 Constipation, unspecified: Secondary | ICD-10-CM

## 2015-03-26 DIAGNOSIS — L97222 Non-pressure chronic ulcer of left calf with fat layer exposed: Secondary | ICD-10-CM | POA: Diagnosis not present

## 2015-03-26 LAB — CBC WITH DIFFERENTIAL/PLATELET
Basophils Absolute: 0 10*3/uL (ref 0–0.1)
Basophils Relative: 1 %
Eosinophils Absolute: 0.2 10*3/uL (ref 0–0.7)
Eosinophils Relative: 4 %
HEMATOCRIT: 41.8 % (ref 35.0–47.0)
HEMOGLOBIN: 13.5 g/dL (ref 12.0–16.0)
LYMPHS ABS: 1.4 10*3/uL (ref 1.0–3.6)
LYMPHS PCT: 30 %
MCH: 28.6 pg (ref 26.0–34.0)
MCHC: 32.3 g/dL (ref 32.0–36.0)
MCV: 88.6 fL (ref 80.0–100.0)
MONOS PCT: 12 %
Monocytes Absolute: 0.5 10*3/uL (ref 0.2–0.9)
NEUTROS ABS: 2.4 10*3/uL (ref 1.4–6.5)
NEUTROS PCT: 53 %
Platelets: 154 10*3/uL (ref 150–440)
RBC: 4.71 MIL/uL (ref 3.80–5.20)
RDW: 15 % — ABNORMAL HIGH (ref 11.5–14.5)
WBC: 4.6 10*3/uL (ref 3.6–11.0)

## 2015-03-26 LAB — BASIC METABOLIC PANEL
Anion gap: 11 (ref 5–15)
BUN: 26 mg/dL — ABNORMAL HIGH (ref 6–20)
CHLORIDE: 95 mmol/L — AB (ref 101–111)
CO2: 36 mmol/L — AB (ref 22–32)
Calcium: 9.4 mg/dL (ref 8.9–10.3)
Creatinine, Ser: 0.9 mg/dL (ref 0.44–1.00)
GFR calc non Af Amer: 55 mL/min — ABNORMAL LOW (ref >60)
Glucose, Bld: 122 mg/dL — ABNORMAL HIGH (ref 65–99)
POTASSIUM: 3.4 mmol/L — AB (ref 3.5–5.1)
Sodium: 142 mmol/L (ref 135–145)

## 2015-03-26 LAB — BRAIN NATRIURETIC PEPTIDE: B Natriuretic Peptide: 186 pg/mL — ABNORMAL HIGH (ref 0.0–100.0)

## 2015-03-26 MED ORDER — WARFARIN SODIUM 5 MG PO TABS: ORAL_TABLET | ORAL | Status: DC

## 2015-03-26 NOTE — Progress Notes (Signed)
[name], [name] ([id number]) Visit Report for 03/26/2015 Arrival Information Details Patient Name: [name], [name] Date of Service: 03/26/2015 3:15 PM Medical Record Number: [id number] Patient Account Number: [id number] Date of Birth/Sex: 02-01-1922 (81 y.o. Female) Treating RN: Junious Dresser Primary Care Physician: Pernell Dupre Other Clinician: Referring Physician: Pernell Dupre Treating Physician/Extender: Frann Rider in Treatment: 6 Visit Information History Since Last Visit Added or deleted any medications: No Patient Arrived: Wheel Chair Any new allergies or adverse reactions: No Arrival Time: 15:34 Had a fall or experienced change in No Accompanied By: self activities of daily living that may affect Transfer Assistance: None risk of falls: Patient Identification Verified: Yes Signs or symptoms of abuse/neglect since last No Secondary Verification Yes visito Process Completed: Hospitalized since last visit: No Patient Requires No Has Dressing in Place as Prescribed: Yes Transmission-Based Pain Present Now: No Precautions: Patient Has Alerts: Yes Patient Alerts: Patient on Blood Thinner ABI,L:1.10,R:1.10 Electronic Signature(s) Signed: 03/26/2015 4:40:25 PM By: Junious Dresser RN Entered By: Junious Dresser on 03/26/2015 15:34:35 [name] ([id number]) -------------------------------------------------------------------------------- Clinic Level of Care Assessment Details Patient Name: [name] Date of Service: 03/26/2015 3:15 PM Medical Record Number: [id number] Patient Account Number: [id number] Date of Birth/Sex: [birth year] (81 y.o. Female) Treating RN: Junious Dresser Primary Care Physician: Pernell Dupre Other Clinician: Referring Physician: Pernell Dupre Treating Physician/Extender: Frann Rider in Treatment: 6 Clinic Level of Care Assessment Items TOOL 4 Quantity Score []  - Use when only an EandM is performed on  FOLLOW-UP visit 0 ASSESSMENTS - Nursing Assessment / Reassessment []  - Reassessment of Co-morbidities (includes updates in patient status) 0 []  - Reassessment of Adherence to Treatment Plan 0 ASSESSMENTS - Wound and Skin Assessment / Reassessment X - Simple Wound Assessment / Reassessment - one wound 1 5 []  - Complex Wound Assessment / Reassessment - multiple wounds 0 []  - Dermatologic / Skin Assessment (not related to wound area) 0 ASSESSMENTS - Focused Assessment X - Circumferential Edema Measurements - multi extremities 1 5 []  - Nutritional Assessment / Counseling / Intervention 0 X - Lower Extremity Assessment (monofilament, tuning fork, pulses) 1 5 []  - Peripheral Arterial Disease Assessment (using hand held doppler) 0 ASSESSMENTS - Ostomy and/or Continence Assessment and Care []  - Incontinence Assessment and Management 0 []  - Ostomy Care Assessment and Management (repouching, etc.) 0 PROCESS - Coordination of Care X - Simple Patient / Family Education for ongoing care 1 15 []  - Complex (extensive) Patient / Family Education for ongoing care 0 X - Staff obtains Programmer, systems, Records, Test Results / Process Orders 1 10 []  - Staff telephones HHA, Nursing Homes / Clarify orders / etc 0 []  - Routine Transfer to another Facility (non-emergent condition) 0 [name], [name] ([id number]) []  - Routine Hospital Admission (non-emergent condition) 0 []  - New Admissions / Biomedical engineer / Ordering NPWT, Apligraf, etc. 0 []  - Emergency Hospital Admission (emergent condition) 0 X - Simple Discharge Coordination 1 10 []  - Complex (extensive) Discharge Coordination 0 PROCESS - Special Needs []  - Pediatric / Minor Patient Management 0 []  - Isolation Patient Management 0 []  - Hearing / Language / Visual special needs 0 []  - Assessment of Community assistance (transportation, D/C planning, etc.) 0 []  - Additional assistance / Altered mentation 0 []  - Support Surface(s) Assessment (bed,  cushion, seat, etc.) 0 INTERVENTIONS - Wound Cleansing / Measurement X - Simple Wound Cleansing - one wound 1 5 []  - Complex Wound Cleansing - multiple wounds 0 X - Wound Imaging (photographs -  any number of wounds) 1 5 []  - Wound Tracing (instead of photographs) 0 X - Simple Wound Measurement - one wound 1 5 []  - Complex Wound Measurement - multiple wounds 0 INTERVENTIONS - Wound Dressings X - Small Wound Dressing one or multiple wounds 1 10 []  - Medium Wound Dressing one or multiple wounds 0 []  - Large Wound Dressing one or multiple wounds 0 []  - Application of Medications - topical 0 []  - Application of Medications - injection 0 INTERVENTIONS - Miscellaneous []  - External ear exam 0 [name], DEPREY. ([id number]) []  - Specimen Collection (cultures, biopsies, blood, body fluids, etc.) 0 []  - Specimen(s) / Culture(s) sent or taken to Lab for analysis 0 []  - Patient Transfer (multiple staff / Harrel Lemon Lift / Similar devices) 0 []  - Simple Staple / Suture removal (25 or less) 0 []  - Complex Staple / Suture removal (26 or more) 0 []  - Hypo / Hyperglycemic Management (close monitor of Blood Glucose) 0 []  - Ankle / Brachial Index (ABI) - do not check if billed separately 0 X - Vital Signs 1 5 Has the patient been seen at the hospital within the last three years: Yes Total Score: 80 Level Of Care: New/Established - Level 3 Electronic Signature(s) Signed: 03/26/2015 4:40:25 PM By: Junious Dresser RN Entered By: Junious Dresser on 03/26/2015 15:52:57 [name] ([id number]) -------------------------------------------------------------------------------- Encounter Discharge Information Details Patient Name: [name] Date of Service: 03/26/2015 3:15 PM Medical Record Number: [id number] Patient Account Number: [id number] Date of Birth/Sex: 12-Nov-1922 (81 y.o. Female) Treating RN: Junious Dresser Primary Care Physician: Pernell Dupre Other Clinician: Referring Physician:  Pernell Dupre Treating Physician/Extender: Frann Rider in Treatment: 6 Encounter Discharge Information Items Schedule Follow-up Appointment: No Medication Reconciliation completed No and provided to Patient/Care Faith Patricelli: Provided on Clinical Summary of Care: 03/26/2015 Form Type Recipient Paper Patient JW Electronic Signature(s) Signed: 03/26/2015 4:02:43 PM By: Ruthine Dose Entered By: Ruthine Dose on 03/26/2015 16:02:43 [name] ([id number]) -------------------------------------------------------------------------------- Lower Extremity Assessment Details Patient Name: [name] Date of Service: 03/26/2015 3:15 PM Medical Record Number: [id number] Patient Account Number: [id number] Date of Birth/Sex: Jan 04, 1922 (81 y.o. Female) Treating RN: Junious Dresser Primary Care Physician: Pernell Dupre Other Clinician: Referring Physician: Pernell Dupre Treating Physician/Extender: Frann Rider in Treatment: 6 Edema Assessment Assessed: [Left: Yes] [Right: No] Edema: [Left: Ye] [Right: s] Calf Left: Right: Point of Measurement: 32 cm From Medial Instep 37 cm cm Ankle Left: Right: Point of Measurement: 12 cm From Medial Instep 21.5 cm cm Vascular Assessment Claudication: Claudication Assessment [Left:Intermittent] Pulses: Posterior Tibial Palpable: [Left:Yes] Dorsalis Pedis Palpable: [Left:Yes] Extremity colors, hair growth, and conditions: Extremity Color: [Left:Mottled] Hair Growth on Extremity: [Left:No] Temperature of Extremity: [Left:Cool] Capillary Refill: [Left:< 3 seconds] Dependent Rubor: [Left:No] Blanched when Elevated: [Left:No] Toe Nail Assessment Left: Right: Thick: Yes Discolored: Yes Deformed: No Improper Length and Hygiene: No [name], [name] ([id number]) Electronic Signature(s) Signed: 03/26/2015 4:40:25 PM By: Junious Dresser RN Entered By: Junious Dresser on 03/26/2015 15:39:17 [name]  ([id number]) -------------------------------------------------------------------------------- Multi Wound Chart Details Patient Name: [name] Date of Service: 03/26/2015 3:15 PM Medical Record Number: [id number] Patient Account Number: [id number] Date of Birth/Sex: 10-04-1922 (81 y.o. Female) Treating RN: Junious Dresser Primary Care Physician: Pernell Dupre Other Clinician: Referring Physician: Pernell Dupre Treating Physician/Extender: Frann Rider in Treatment: 6 Vital Signs Height(in): 63 Pulse(bpm): 75 Weight(lbs): 148 Blood Pressure 138/63 (mmHg): Body Mass Index(BMI): 26 Temperature(F): 97.5 Respiratory Rate 20 (breaths/min): Photos: [3:No Photos] [N/A:N/A]  Wound Location: [3:Left Lower Leg - Posterior N/A] Wounding Event: [3:Gradually Appeared] [N/A:N/A] Primary Etiology: [3:Infection - not elsewhere N/A classified] Comorbid History: [3:Cataracts, Angina, Arrhythmia, Congestive Heart Failure, Hypertension, Hepatitis B, Osteoarthritis, Seizure Disorder] [N/A:N/A] Date Acquired: [3:01/08/2015] [N/A:N/A] Weeks of Treatment: [3:6] [N/A:N/A] Wound Status: [3:Open] [N/A:N/A] Measurements L x W x D 1x0.7x0.1 [N/A:N/A] (cm) Area (cm) : [3:0.55] [N/A:N/A] Volume (cm) : [3:0.055] [N/A:N/A] % Reduction in Area: [3:86.00%] [N/A:N/A] % Reduction in Volume: 93.00% [N/A:N/A] Classification: [3:Full Thickness Without Exposed Support Structures] [N/A:N/A] Exudate Amount: [3:Medium] [N/A:N/A] Exudate Type: [3:Serosanguineous] [N/A:N/A] Exudate Color: [3:red, brown] [N/A:N/A] Wound Margin: [3:Indistinct, nonvisible] [N/A:N/A] Granulation Amount: [3:Large (67-100%)] [N/A:N/A] Granulation Quality: [3:Red, Hyper-granulation] [N/A:N/A] Necrotic Amount: [3:None Present (0%)] [N/A:N/A] Exposed Structures: Fascia: No N/A N/A Fat: No Tendon: No Muscle: No Joint: No Bone: No Limited to Skin Breakdown Epithelialization: Small (1-33%) N/A N/A Periwound Skin  Texture: Edema: No N/A N/A Excoriation: No Induration: No Callus: No Crepitus: No Fluctuance: No Friable: No Rash: No Scarring: No Periwound Skin Moist: Yes N/A N/A Moisture: Maceration: No Dry/Scaly: No Periwound Skin Color: Atrophie Blanche: No N/A N/A Cyanosis: No Ecchymosis: No Erythema: No Hemosiderin Staining: No Mottled: No Pallor: No Rubor: No Temperature: No Abnormality N/A N/A Tenderness on Yes N/A N/A Palpation: Wound Preparation: Ulcer Cleansing: N/A N/A Rinsed/Irrigated with Saline Topical Anesthetic Applied: Other: lidocaine 4% Treatment Notes Electronic Signature(s) Signed: 03/26/2015 4:40:25 PM By: Junious Dresser RN Entered By: Junious Dresser on 03/26/2015 15:48:33 [name] ([id number]) -------------------------------------------------------------------------------- [place] Details Patient Name: [name] Date of Service: 03/26/2015 3:15 PM Medical Record Number: [id number] Patient Account Number: [id number] Date of Birth/Sex: 01-Apr-1922 (81 y.o. Female) Treating RN: Junious Dresser Primary Care Physician: Pernell Dupre Other Clinician: Referring Physician: Pernell Dupre Treating Physician/Extender: Frann Rider in Treatment: 6 Active Inactive Abuse / Safety / Falls / Self Care Management Nursing Diagnoses: Impaired physical mobility Potential for falls Goals: Patient will remain injury free Date Initiated: 02/06/2015 Goal Status: Active Patient/caregiver will verbalize understanding of skin care regimen Date Initiated: 02/06/2015 Goal Status: Active Patient/caregiver will verbalize/demonstrate measures taken to prevent injury and/or falls Date Initiated: 02/06/2015 Goal Status: Active Interventions: Provide education on fall prevention Provide education on safe transfers Notes: Orientation to the Wound Care Program Nursing Diagnoses: Knowledge deficit related to the wound healing center  program Goals: Patient/caregiver will verbalize understanding of the [place] Program Date Initiated: 02/06/2015 Goal Status: Active Interventions: Provide education on orientation to the wound center Notes: [name], [name] ([id number]) Wound/Skin Impairment Nursing Diagnoses: Impaired tissue integrity Knowledge deficit related to ulceration/compromised skin integrity Goals: Patient/caregiver will verbalize understanding of skin care regimen Date Initiated: 02/06/2015 Goal Status: Active Ulcer/skin breakdown will heal within 14 weeks Date Initiated: 02/06/2015 Goal Status: Active Interventions: Assess patient/caregiver ability to obtain necessary supplies Assess patient/caregiver ability to perform ulcer/skin care regimen upon admission and as needed Assess ulceration(s) every visit Provide education on ulcer and skin care Treatment Activities: Skin care regimen initiated : 03/26/2015 Topical wound management initiated : 03/26/2015 Notes: Electronic Signature(s) Signed: 03/26/2015 4:40:25 PM By: Junious Dresser RN Entered By: Junious Dresser on 03/26/2015 15:48:18 [name] ([id number]) -------------------------------------------------------------------------------- Pain Assessment Details Patient Name: [name] Date of Service: 03/26/2015 3:15 PM Medical Record Number: [id number] Patient Account Number: [id number] Date of Birth/Sex: December 30, 1921 (81 y.o. Female) Treating RN: Junious Dresser Primary Care Physician: Pernell Dupre Other Clinician: Referring Physician: Pernell Dupre Treating Physician/Extender: Frann Rider in Treatment: 6 Active Problems Location of Pain Severity and  Description of Pain Patient Has Paino No Site Locations Pain Management and Medication Current Pain Management: Electronic Signature(s) Signed: 03/26/2015 4:40:25 PM By: Junious Dresser RN Entered By: Junious Dresser on 03/26/2015 15:36:09 [name]  ([id number]) -------------------------------------------------------------------------------- Patient/Caregiver Education Details Patient Name: [name] Date of Service: 03/26/2015 3:15 PM Medical Record Number: [id number] Patient Account Number: [id number] Date of Birth/Gender: 08-Feb-1922 (81 y.o. Female) Treating RN: Junious Dresser Primary Care Physician: Pernell Dupre Other Clinician: Referring Physician: Pernell Dupre Treating Physician/Extender: Frann Rider in Treatment: 6 Education Assessment Education Provided To: Patient Education Topics Provided Safety: Methods: Explain/Verbal Responses: State content correctly Wound/Skin Impairment: Methods: Explain/Verbal Responses: State content correctly Electronic Signature(s) Signed: 03/26/2015 4:40:25 PM By: Junious Dresser RN Entered By: Junious Dresser on 03/26/2015 15:53:37 [name] ([id number]) -------------------------------------------------------------------------------- Wound Assessment Details Patient Name: [name] Date of Service: 03/26/2015 3:15 PM Medical Record Number: [id number] Patient Account Number: [id number] Date of Birth/Sex: 1922-08-04 (81 y.o. Female) Treating RN: Junious Dresser Primary Care Physician: Pernell Dupre Other Clinician: Referring Physician: Pernell Dupre Treating Physician/Extender: Frann Rider in Treatment: 6 Wound Status Wound Number: 3 Primary Infection - not elsewhere classified Etiology: Wound Location: Left Lower Leg - Posterior Wound Open Wounding Event: Gradually Appeared Status: Date Acquired: 01/08/2015 Comorbid Cataracts, Angina, Arrhythmia, Weeks Of Treatment: 6 History: Congestive Heart Failure, Hypertension, Clustered Wound: No Hepatitis B, Osteoarthritis, Seizure Disorder Photos Photo Uploaded By: Junious Dresser on 03/26/2015 16:49:30 Wound Measurements Length: (cm) 1 Width: (cm) 0.7 Depth: (cm) 0.1 Area: (cm)  0.55 Volume: (cm) 0.055 % Reduction in Area: 86% % Reduction in Volume: 93% Epithelialization: Small (1-33%) Tunneling: No Undermining: No Wound Description Full Thickness Without Exposed Foul Odor A Classification: Support Structures Wound Margin: Indistinct, nonvisible Exudate Medium Amount: Exudate Type: Serosanguineous Exudate Color: red, brown fter Cleansing: No Wound Bed Granulation Amount: Large (67-100%) Exposed Structure [name], [name] ([id number]) Granulation Quality: Red, Hyper-granulation Fascia Exposed: No Necrotic Amount: None Present (0%) Fat Layer Exposed: No Tendon Exposed: No Muscle Exposed: No Joint Exposed: No Bone Exposed: No Limited to Skin Breakdown Periwound Skin Texture Texture Color No Abnormalities Noted: No No Abnormalities Noted: No Callus: No Atrophie Blanche: No Crepitus: No Cyanosis: No Excoriation: No Ecchymosis: No Fluctuance: No Erythema: No Friable: No Hemosiderin Staining: No Induration: No Mottled: No Localized Edema: No Pallor: No Rash: No Rubor: No Scarring: No Temperature / Pain Moisture Temperature: No Abnormality No Abnormalities Noted: No Tenderness on Palpation: Yes Dry / Scaly: No Maceration: No Moist: Yes Wound Preparation Ulcer Cleansing: Rinsed/Irrigated with Saline Topical Anesthetic Applied: Other: lidocaine 4%, Treatment Notes Wound #3 (Left, Posterior Lower Leg) 1. Cleansed with: Clean wound with Normal Saline 2. Anesthetic Topical Lidocaine 4% cream to wound bed prior to debridement 4. Dressing Applied: Foam with Silver 5. Secondary Dressing Applied Bordered Foam Dressing Electronic Signature(s) Signed: 03/26/2015 4:40:25 PM By: Junious Dresser RN Entered By: Junious Dresser on 03/26/2015 15:41:29 [name] ([id number]) -------------------------------------------------------------------------------- [place] Details Patient Name: [name] Date of Service: 03/26/2015 3:15  PM Medical Record Number: [id number] Patient Account Number: [id number] Date of Birth/Sex: December 12, 1921 (81 y.o. Female) Treating RN: Junious Dresser Primary Care Physician: Pernell Dupre Other Clinician: Referring Physician: Pernell Dupre Treating Physician/Extender: Frann Rider in Treatment: 6 Vital Signs Time Taken: 15:35 Temperature (F): 97.5 Height (in): 63 Pulse (bpm): 75 Weight (lbs): 148 Respiratory Rate (breaths/min): 20 Body Mass Index (BMI): 26.2 Blood Pressure (mmHg): 138/63 Reference Range: 80 - 120 mg / dl Electronic Signature(s) Signed: 03/26/2015 4:40:25  PM By: Junious Dresser RN Entered By: Junious Dresser on 03/26/2015 15:36:28

## 2015-03-26 NOTE — Progress Notes (Signed)
[name], [name] ([id number]) Visit Report for 03/26/2015 Chief Complaint Document Details Patient Name: [name], [name]. Date of Service: 03/26/2015 3:15 PM Medical Record Number: [id number] Patient Account Number: [id number] Date of Birth/Sex: [birth year] (81 y.o. Female) Treating RN: Junious Dresser Primary Care Physician: Pernell Dupre Other Clinician: Referring Physician: Pernell Dupre Treating Physician/Extender: Frann Rider in Treatment: 6 Information Obtained from: Patient Chief Complaint Patient returns to the wound care center for new ulcer to: left lower extremity lateral part of the calf. This has been there for about a month. Electronic Signature(s) Signed: 03/26/2015 5:02:53 PM By: Christin Fudge MD, FACS Entered By: Christin Fudge on 03/26/2015 15:56:51 [name] ([id number]) -------------------------------------------------------------------------------- HPI Details Patient Name: [name] Date of Service: 03/26/2015 3:15 PM Medical Record Number: [id number] Patient Account Number: [id number] Date of Birth/Sex: 1922-10-04 (81 y.o. Female) Treating RN: Junious Dresser Primary Care Physician: Pernell Dupre Other Clinician: Referring Physician: Pernell Dupre Treating Physician/Extender: Frann Rider in Treatment: 6 History of Present Illness HPI Description: The patient is a pleasant 81 year old with a past medical history significant for aortic valve replacement (on Coumadin), pacemaker, CHF, abdominal aortic aneurysm, and CVA. No history of diabetes or peripheral vascular disease. She underwent excision of a squamous cell cancer on her right upper arm and left dorsal foot in early August 2015 at the [facility]. Shave biopsy on May 11, 2014 showed "superficially invasive squamous cell carcinoma". She subsequently underwent wide excision with clear margins. She presented to the wound clinic with full  thickness open surgical wounds. No symptoms to suggest ischemic rest pain or claudication. ABI 1.1. Cultures of L foot on August 09, 2014 grew ORSA sensitive to tigecycline. She has completed a course of doxycycline. 02/06/2015. -- This time around she had multiple furuncles over her left lower extremity and some on her right too. Was treated by her primary care physician with doxycycline and also was treated for shingles. Most of these ulcerations had healed primarily but the one on her left lower extremity lateral part is open and has continued to drain for about a month. She has minimal pain over this area. She has been using some local ointment and a dressing has been put over this. Last year she had a full vascular workup at [facility] in [place] and saw Dr. Donnetta Hutching for an opinion regarding arterial insufficiency. though she had significant arterial insufficiency a major surgical procedure was not recommended because of her age. Conservative therapy was recommended. The patient has had some vascular vein surgery done about 6 years ago in this hospital. 03/26/2015 -- she now has several other issues going with a cardiac problem and has been quite worried about this. Her leg is doing fine otherwise. Electronic Signature(s) Signed: 03/26/2015 5:02:53 PM By: Christin Fudge MD, FACS Entered By: Christin Fudge on 03/26/2015 15:58:38 [name] ([id number]) -------------------------------------------------------------------------------- [name] TISS Details Patient Name: [name] Date of Service: 03/26/2015 3:15 PM Medical Record Number: [id number] Patient Account Number: [id number] Date of Birth/Sex: 26-Jul-1922 (81 y.o. Female) Treating RN: Junious Dresser Primary Care Physician: Pernell Dupre Other Clinician: Referring Physician: Pernell Dupre Treating Physician/Extender: Frann Rider in Treatment: 6 Procedure Performed for: Wound #3  Left,Posterior Lower Leg Performed By: Physician Pat Patrick., MD Notes she has a central area of hypergranulation tissue and I used silver nitrate stick to chemically cauterize this. Electronic Signature(s) Signed: 03/26/2015 5:02:53 PM By: Christin Fudge MD, FACS Entered By: Christin Fudge on 03/26/2015 15:56:41  [name], [name] ([id number]) -------------------------------------------------------------------------------- Physical Exam Details Patient Name: [name]. Date of Service: 03/26/2015 3:15 PM Medical Record Number: [id number] Patient Account Number: [id number] Date of Birth/Sex: 09/21/1922 (81 y.o. Female) Treating RN: Junious Dresser Primary Care Physician: Pernell Dupre Other Clinician: Referring Physician: Pernell Dupre Treating Physician/Extender: Frann Rider in Treatment: 6 Constitutional . Pulse regular. Respirations normal and unlabored. Afebrile. . Eyes Nonicteric. Reactive to light. Ears, Nose, Mouth, and Throat Lips, teeth, and gums WNL.[name] Moist mucosa without lesions . Neck supple and nontender. No palpable supraclavicular or cervical adenopathy. Normal sized without goiter. Respiratory WNL. No retractions.. Cardiovascular Pedal Pulses WNL. No clubbing, cyanosis or edema. Integumentary (Hair, Skin) the left lateral leg has a clean granulation tissue with some central area of hyper granulation.. No crepitus or fluctuance. No peri-wound warmth or erythema. No masses.[name] Psychiatric Judgement and insight Intact.. No evidence of depression, anxiety, or agitation.. Electronic Signature(s) Signed: 03/26/2015 5:02:53 PM By: Christin Fudge MD, FACS Entered By: Christin Fudge on 03/26/2015 15:59:33 [name] ([id number]) -------------------------------------------------------------------------------- Physician Orders Details Patient Name: [name] Date of Service: 03/26/2015 3:15 PM Medical Record Number: [id number] Patient  Account Number: [id number] Date of Birth/Sex: [birth year] (81 y.o. Female) Treating RN: Junious Dresser Primary Care Physician: Pernell Dupre Other Clinician: Referring Physician: Pernell Dupre Treating Physician/Extender: Frann Rider in Treatment: 6 Verbal / Phone Orders: Yes Clinician: Junious Dresser Read Back and Verified: Yes Diagnosis Coding Wound Cleansing Wound #3 Left,Posterior Lower Leg o Clean wound with Normal Saline. o Cleanse wound with mild soap and water o May Shower, gently pat wound dry prior to applying new dressing. o May shower with protection. Anesthetic Wound #3 Left,Posterior Lower Leg o Topical Lidocaine 4% cream applied to wound bed prior to debridement Primary Wound Dressing Wound #3 Left,Posterior Lower Leg o Foam with Silver Secondary Dressing Wound #3 Left,Posterior Lower Leg o Boardered Foam Dressing Dressing Change Frequency Wound #3 Left,Posterior Lower Leg o Change dressing every other day. Follow-up Appointments Wound #3 Left,Posterior Lower Leg o Return Appointment in 1 week. Electronic Signature(s) Signed: 03/26/2015 4:40:25 PM By: Junious Dresser RN Signed: 03/26/2015 5:02:53 PM By: Christin Fudge MD, FACS Entered By: Junious Dresser on 03/26/2015 15:52:20 [name] ([id number]) -------------------------------------------------------------------------------- Problem List Details Patient Name: [name], [name] Date of Service: 03/26/2015 3:15 PM Medical Record Number: [id number] Patient Account Number: [id number] Date of Birth/Sex: 21-Nov-1921 (81 y.o. Female) Treating RN: Junious Dresser Primary Care Physician: Pernell Dupre Other Clinician: Referring Physician: Pernell Dupre Treating Physician/Extender: Frann Rider in Treatment: 6 Active Problems ICD-10 Encounter Code Description Active Date Diagnosis [phone] Non-pressure chronic ulcer of left calf with fat layer 02/06/2015  Yes exposed L02.426 Furuncle of left lower limb 02/06/2015 Yes L02.425 Furuncle of right lower limb 02/06/2015 Yes I70.248 Atherosclerosis of native arteries of left leg with ulceration 02/06/2015 Yes of other part of lower left leg Inactive Problems Resolved Problems Electronic Signature(s) Signed: 03/26/2015 5:02:53 PM By: Christin Fudge MD, FACS Entered By: Christin Fudge on 03/26/2015 15:55:51 [name] ([id number]) -------------------------------------------------------------------------------- Progress Note Details Patient Name: [name] Date of Service: 03/26/2015 3:15 PM Medical Record Number: [id number] Patient Account Number: [id number] Date of Birth/Sex: [birth year] (81 y.o. Female) Treating RN: Junious Dresser Primary Care Physician: Pernell Dupre Other Clinician: Referring Physician: Pernell Dupre Treating Physician/Extender: Frann Rider in Treatment: 6 Subjective Chief Complaint Information obtained from Patient Patient returns to the wound care center for new ulcer to: left lower extremity lateral part of the calf. This  has been there for about a month. History of Present Illness (HPI) The patient is a pleasant 81 year old with a past medical history significant for aortic valve replacement (on Coumadin), pacemaker, CHF, abdominal aortic aneurysm, and CVA. No history of diabetes or peripheral vascular disease. She underwent excision of a squamous cell cancer on her right upper arm and left dorsal foot in early August 2015 at the [facility]. Shave biopsy on May 11, 2014 showed "superficially invasive squamous cell carcinoma". She subsequently underwent wide excision with clear margins. She presented to the wound clinic with full thickness open surgical wounds. No symptoms to suggest ischemic rest pain or claudication. ABI 1.1. Cultures of L foot on August 09, 2014 grew ORSA sensitive to tigecycline. She has completed a  course of doxycycline. 02/06/2015. -- This time around she had multiple furuncles over her left lower extremity and some on her right too. Was treated by her primary care physician with doxycycline and also was treated for shingles. Most of these ulcerations had healed primarily but the one on her left lower extremity lateral part is open and has continued to drain for about a month. She has minimal pain over this area. She has been using some local ointment and a dressing has been put over this. Last year she had a full vascular workup at [facility] in [place] and saw Dr. Donnetta Hutching for an opinion regarding arterial insufficiency. though she had significant arterial insufficiency a major surgical procedure was not recommended because of her age. Conservative therapy was recommended. The patient has had some vascular vein surgery done about 6 years ago in this hospital. 03/26/2015 -- she now has several other issues going with a cardiac problem and has been quite worried about this. Her leg is doing fine otherwise. Objective [name], [name] ([id number]) Constitutional Pulse regular. Respirations normal and unlabored. Afebrile. Vitals Time Taken: 3:35 PM, Height: 63 in, Weight: 148 lbs, BMI: 26.2, Temperature: 97.5 F, Pulse: 75 bpm, Respiratory Rate: 20 breaths/min, Blood Pressure: 138/63 mmHg. Eyes Nonicteric. Reactive to light. Ears, Nose, Mouth, and Throat Lips, teeth, and gums WNL.[name] Moist mucosa without lesions . Neck supple and nontender. No palpable supraclavicular or cervical adenopathy. Normal sized without goiter. Respiratory WNL. No retractions.. Cardiovascular Pedal Pulses WNL. No clubbing, cyanosis or edema. Psychiatric Judgement and insight Intact.. No evidence of depression, anxiety, or agitation.. Integumentary (Hair, Skin) the left lateral leg has a clean granulation tissue with some central area of hyper granulation.. No crepitus or fluctuance. No peri-wound warmth  or erythema. No masses.. Wound #3 status is Open. Original cause of wound was Gradually Appeared. The wound is located on the Left,Posterior Lower Leg. The wound measures 1cm length x 0.7cm width x 0.1cm depth; 0.55cm^2 area and 0.055cm^3 volume. The wound is limited to skin breakdown. There is no tunneling or undermining noted. There is a medium amount of serosanguineous drainage noted. The wound margin is indistinct and nonvisible. There is large (67-100%) red granulation within the wound bed. There is no necrotic tissue within the wound bed. The periwound skin appearance exhibited: Moist. The periwound skin appearance did not exhibit: Callus, Crepitus, Excoriation, Fluctuance, Friable, Induration, Localized Edema, Rash, Scarring, Dry/Scaly, Maceration, Atrophie Blanche, Cyanosis, Ecchymosis, Hemosiderin Staining, Mottled, Pallor, Rubor, Erythema. Periwound temperature was noted as No Abnormality. The periwound has tenderness on palpation. Assessment Active Problems [name], [name] ([id number]) ICD-10 [id number] - Non-pressure chronic ulcer of left calf with fat layer exposed L02.426 - Furuncle of left lower limb L02.425 -  Furuncle of right lower limb I70.248 - Atherosclerosis of native arteries of left leg with ulceration of other part of lower left leg We will stop the Prisma and use some foam with silver over this area to gently put pressure and avoid the hypergranulation tissue. she will come back and see [place] next week unless her cardiac appointment clashes with this. Procedures Wound #3 Wound #3 is an Infection - not elsewhere classified located on the Left,Posterior Lower Leg . An CHEM CAUT GRANULATION TISS procedure was performed by Keilynn Marano, Jackson Latino., MD. Notes: she has a central area of hypergranulation tissue and I used silver nitrate stick to chemically cauterize this. Plan Wound Cleansing: Wound #3 Left,Posterior Lower Leg: Clean wound with Normal Saline. Cleanse wound with  mild soap and water May Shower, gently pat wound dry prior to applying new dressing. May shower with protection. Anesthetic: Wound #3 Left,Posterior Lower Leg: Topical Lidocaine 4% cream applied to wound bed prior to debridement Primary Wound Dressing: Wound #3 Left,Posterior Lower Leg: Foam with Silver Secondary Dressing: Wound #3 Left,Posterior Lower Leg: Boardered Foam Dressing Dressing Change Frequency: Wound #3 Left,Posterior Lower Leg: Change dressing every other day. Follow-up Appointments: Wound #3 Left,Posterior Lower Leg: [name], [name] ([id number]) Return Appointment in 1 week. We will stop the Prisma and use some foam with silver over this area to gently put pressure and avoid the hypergranulation tissue. she will come back and see [place] next week unless her cardiac appointment clashes with this. Electronic Signature(s) Signed: 03/26/2015 5:02:53 PM By: Christin Fudge MD, FACS Entered By: Christin Fudge on 03/26/2015 16:01:37 [name] ([id number]) -------------------------------------------------------------------------------- SuperBill Details Patient Name: [name] Date of Service: 03/26/2015 Medical Record Number: [id number] Patient Account Number: [id number] Date of Birth/Sex: 1922/01/23 (81 y.o. Female) Treating RN: Junious Dresser Primary Care Physician: Pernell Dupre Other Clinician: Referring Physician: Pernell Dupre Treating Physician/Extender: Frann Rider in Treatment: 6 Diagnosis Coding ICD-10 Codes Code Description [phone] Non-pressure chronic ulcer of left calf with fat layer exposed L02.426 Furuncle of left lower limb L02.425 Furuncle of right lower limb I70.248 Atherosclerosis of native arteries of left leg with ulceration of other part of lower left leg Facility Procedures CPT4 Code: [id number] Description: 55974 - CHEM CAUT GRANULATION TISS ICD-10 Description Diagnosis L97.222 Non-pressure chronic ulcer of left calf  with fat laye Modifier: r exposed Quantity: 1 Physician Procedures CPT4 Code: [id number] Description: 64680 - WC PHYS CHEM CAUT GRAN TISSUE ICD-10 Description Diagnosis L97.222 Non-pressure chronic ulcer of left calf with fat layer Modifier: exposed Quantity: 1 Electronic Signature(s) Signed: 03/26/2015 5:02:53 PM By: Christin Fudge MD, FACS Entered By: Christin Fudge on 03/26/2015 16:01:50

## 2015-03-26 NOTE — Telephone Encounter (Signed)
Spoke w/ Joelene Millin.  Advised her that PA note this weekend recommends ov & labs today.  Advised her that Christell Faith, PA will open his schedule to see her this afternoon. She is appreciative and will have pt here at 1:45 today.

## 2015-03-26 NOTE — Telephone Encounter (Signed)
Patient will be seen in office today by Christell Faith

## 2015-03-26 NOTE — Progress Notes (Signed)
Cardiology Office Note:  Date of Encounter: 03/26/2015  ID: [name], DOB September 27, 1922, MRN [id number]  PCP:  [name], MD Primary Cardiologist:  Dr. Rockey Situ, MD  Chief Complaint  Patient presents with  . other    Pt. c/o shortness of breath, LE edema, chest pain that radiates to her left shoulder. Meds reviewed by the patient verbally.     HPI:  81 year old female with history of tachy-brady syndrome s/p MDT PPM, permanent a-fib on warfarin, HTN, bioprosthetic AVR, AAA with repair 4-5 years ago, chronic diastolic heart failure,pulmonary HTN, PVD, right brachial arterial stenosis, and TIA in 2012 who called the office answering service over the weekend with increased SOB and chest pain.   No significant coronary artery disease nuclear on stress test in 07/20/2013 that was reported as normal stress nuclear study.No evidence of ischemia. LV Ejection Fraction: 66%. LV Wall Motion: NL LV Function; NL Wall Motion. Echo in 2012 showed EF 50-55%, no RWMA, bioprosthetic aortic valve was functioning normally, mild aortic stenosis, mild AI, left atrium moderately dilated, right atrium mildly dilated, moderate MR and TR with moderate pulmonary hypertension, RVSP 45 mm Hg. Echo 02/2015 showed an EF 55-65%, no RWMA, bioprosthetic aortic valve was present, mild aortic stenosis, mild AI, moderate MR, left atrium moderately dilated, RA and RV mildly dilated, moderate TR, PASP 70 mm Hg.   At her last office visit in March 2016 she has noted to have some subjective orthostasis. Her triamterene was decreased and it looks like she was advised to drink Gatorade. In follow up phone call on 4/20 her triamterene was actually completely stopped. Patient called back on 4/29 with weight gain of 7 pounds. She was advised to take torsemide as needed. Patient called the answering service on 5/7 stating her weight was up 7 pounds earlier in the week and she increased her torsemide as directed to one whole  tablet daily. Her weight initially dropped by 3 pounds, but she has gained back 1 pound. She also noted some chest pain that was worse with deep inspiration. She was advised on 2 option: 1) go to the ED for evaluation, or 2) increase the torsemide 20 mg to two tabs that morning (5/7) and potassium to 10 mEq. She called back as instructed on 5/8 stating she had voided a lot but had not lost any weight. She was continuing to drink Gatorade at that time. She was continued on torsemide 20 mg 2 tabs daily and potassium 10 mEq daily.   She called our office this morning stating she was still having chronic belching, epigastric pain, abdominal bloating, and discomfort laying on her left side. These symptoms have been present since at least 4/30, though she reports are improving. She notes some discomfort up into her left shoulder, only when she lays on her left side. She has been taking increased amounts of hydrocodone lately for her shoulder pain. Otherwise, she is without any pain along her left shoulder. No chest pain. No SOB. No cough. No lower extremity edema. No exertional symptoms. Weight is actually down 2 pounds when compared to weight on 10/2014. She reports severe constipation and having to strain to have a bowel movement. She last had a bowel movement 2 days ago which was quite hard and difficult to pass. She is passing minimal flatus and considerable belching. She notes considerable abdominal bloating. She has a decreased appetite. She last took her torsemide 2 tabs daily this morning along with her potassium. Her  blood pressure has been well controlled.       Past Medical History  Diagnosis Date  . Permanent atrial fibrillation     a. Chronic coumadin.  . Tachy-brady syndrome     a. Severe in the setting of permanent atrial fibrillation with tachybrady syndrome;  b. 08/2007 s/p MDT [place] ADSR01 [place] PPM.  . S/P placement of cardiac pacemaker     a.  08/2007 s/p MDT [place] PPM.  . Brain  aneurysm     Hx of  . Hypertension   . Thoracic aneurysm     a. 10/2011 CTA: 5.0 x 3.8 cm.  [name] AAA (abdominal aortic aneurysm)     a. 12/2011 U/S: 3.3 cm prox AAA.  . S/P aortic valve replacement with bioprosthetic valve     a. [facility].  [name] Aortic insufficiency     a. S/P bioprosthetic AVR;  b. 02/2013 Echo: EF 50-55%, mild to mod AI (mean grad 9mmHg).  . Chronic diastolic CHF (congestive heart failure)     a. 02/2013 Echo: EF 50-55%.  . Diverticulosis   . Hemorrhoids   . Gastritis   . Hepatitis B 1980's  . Skin cancer     face, ear  and left hand.  . Seizures   . Moderate mitral regurgitation     a. 02/2013 Echo: Mod MR.  . Moderate tricuspid regurgitation     a. 02/2013 Echo: Mod TR.  [name] Pulmonary hypertension     a. 02/2013 Echo: PASP 64mmHg.  . Chest pain     a. H/o nonobs cath;  b. 07/2013 Lexiscan MV: EF 66%, no ischemia.  . Shingles   :  Past Surgical History  Procedure Laterality Date  . Cerebral aneurysm repair    . Abdominal aortic aneurysm repair      Resection  . Aortic valve replacement      With bioprosthetic valve  . Pacemaker placement  08/2007    [employer]; AF permanent; s/p pacemaker for bradycardia with now well controlled ventricular response; Digoxin therapy, question level; Lower extremity venous issues  . Breast biopsy    . Cholecystectomy    . Vesicovaginal fistula closure w/ tah    . Appendectomy    . Lung biopsy  2005  . Skin cancer removal      ear, face and hand  :  Social History:  The patient  reports that she has never smoked. She does not have any smokeless tobacco history on file. She reports that she does not drink alcohol or use illicit drugs.   Family History  Problem Relation Age of Onset  . Cancer Mother   . Heart disease Mother   . Hypertension Mother   . Varicose Veins Mother   . Heart disease Father   . Hypertension Father   . Heart disease Sister   . Hypertension Sister   . Varicose Veins Sister   . AAA (abdominal  aortic aneurysm) Sister   . Heart disease Brother   . Hypertension Brother      Allergies:  Allergies  Allergen Reactions  . Fluticasone-Salmeterol   . Furosemide   . Iodine   . Iohexol      Desc: patient requires a 13 hr prep, when doing exam with contrast,,,,,fever; chills; dyspnea(slg/04/27/09--given 2 hour solumedrol/ benadryl prep--no reaction)   . Latex   . Penicillins   . Sulfonamide Derivatives   . Verapamil      Home Medications:  Current Outpatient Prescriptions  Medication Sig  Dispense Refill  . budesonide-formoterol (SYMBICORT) 160-4.5 MCG/ACT inhaler Inhale 2 puffs into the lungs 2 (two) times daily.    [name] HYDROcodone-acetaminophen (NORCO/VICODIN) 5-325 MG per tablet Take 1 tablet by mouth every 6 (six) hours as needed for moderate pain.    [name] LORazepam (ATIVAN) 0.5 MG tablet Take 0.5 mg by mouth 3 (three) times daily as needed.      . Multiple Vitamins-Minerals (EYE VITAMINS) CAPS Take by mouth daily.      . pantoprazole (PROTONIX) 40 MG tablet Take 40 mg by mouth daily.    . potassium chloride (K-DUR,KLOR-CON) 10 MEQ tablet Take 20 mEq by mouth daily.    [name] torsemide (DEMADEX) 20 MG tablet Take 40 mg by mouth daily.    . traZODone (DESYREL) 50 MG tablet Take 50 mg by mouth at bedtime.     [name] warfarin (COUMADIN) 5 MG tablet Take as directed by anticoagulation clinic 30 tablet 3   No current facility-administered medications for this visit.     Review of Systems:  As above.  All other systems reviewed and are otherwise negative except as noted above.  Physical Exam:  Blood pressure 120/60, pulse 61, height 5\' 3"  (1.6 m), weight 148 lb 12 oz (67.473 kg). Body mass index is 26.36 kg/(m^2). General: Pleasant, NAD, she appears comfortable, belches frequently  Psych: Normal affect. Neuro: Alert and oriented X 3. Moves all extremities spontaneously. HEENT: Normal  Neck: Supple without bruits or JVD. Lungs:  Resp regular and unlabored, CTA. Heart: Irregularly  irregular, no s3, s4. 2/6 systolic murmur RUSB and apex. Abdomen: Soft, distended, TTP LUQ and LLQ, no rebound, BS + x 4.  Extremities: No clubbing, cyanosis or edema.    Accessory Clinical Findings:  EKG - a-fib, 61 bpm, 1 mm st depression leads III, aVF  Other studies Reviewed: Additional studies/ records that were reviewed today include: reviewed prior OVs and phone note.   Recent Labs: 09/15/2014: BUN 27*; Creatinine 0.94; Hemoglobin 13.3; Platelets 224; Potassium 3.7; Sodium 139    Lipid Panel    Component Value Date/Time   CHOL  12/19/2010 1130    181        ATP III CLASSIFICATION:  <200     mg/dL   Desirable  200-239  mg/dL   Borderline High  >=240    mg/dL   High          TRIG 88 12/19/2010 1130   HDL 43 12/19/2010 1130   CHOLHDL 4.2 12/19/2010 1130   VLDL 18 12/19/2010 1130   LDLCALC * 12/19/2010 1130    120        Total Cholesterol/HDL:CHD Risk Coronary Heart Disease Risk Table                     Men   Women  1/2 Average Risk   3.4   3.3  Average Risk       5.0   4.4  2 X Average Risk   9.6   7.1  3 X Average Risk  23.4   11.0        Use the calculated Patient Ratio above and the CHD Risk Table to determine the patient's CHD Risk.        ATP III CLASSIFICATION (LDL):  <100     mg/dL   Optimal  100-129  mg/dL   Near or Above  Optimal  130-159  mg/dL   Borderline  160-189  mg/dL   High  >190     mg/dL   Very High   LDLDIRECT * 12/19/2010 1415    118 (NOTE) ATP III Classification (LDL):      < 100        mg/dL         Optimal     100 - 129     mg/dL         Near or Above Optimal     130 - 159     mg/dL         Borderline High     160 - 189     mg/dL         High      > 190        mg/dL         Very  High      Weights: Wt Readings from Last 3 Encounters:  03/26/15 148 lb 12 oz (67.473 kg)  01/30/15 148 lb (67.132 kg)  10/31/14 150 lb (68.04 kg)     Assessment & Plan:  1. Left upper quadrant and lower quadrant  pain/belching/constipation: -Has been using more hydrocodone lately 2/2 increasing shoulder pain, thus exacerbating possible constipation  -Check acute abdominal series  -Start Miralax bid until bowel movement, then daily -Check BMET, CBC, BNP -She prefers to evaluate further with Lexiscan Myoview -May need GI/PCP work up once the above studies are back  2. Chronic diastolic CHF: -She appears euvolic on exam today, lungs are clear to auscultation, no edema, weight is stable -Given her severely elevated right heart pressures on echo in April she would benefit from daily torsemide as long as SCr remains stable, BP remains stable, and she stays away from the Gatorade   -At this time take torsemide 20 mg daily (pending the above labs) and KCl 10 mEq daily -Given recent echo 02/2015 with normal LV function and severely elevated right heart pressures as above will not repeat echo at this time  2. AAA s/p repair: -Repeat imaging 01/2015 stable caliber aorta   3. Chronic a-fib with tachy-brady syndrome s/p MDT PPM: -Rate controlled -INR has remained therapeutic  -Continue atenolol and Coumadin   4. Statis post bioprosthetic AVR: -Stable with mild aortic stenosis and AI   Dispo: -Follow up pending the above work up   [employer], PA-C [place], [place] [address] [phone] [place] 03/26/2015, 2:00 PM

## 2015-03-26 NOTE — Patient Instructions (Addendum)
Medication Instructions:  Please decrease your torsemide to once daily Please decrease your potassium to once daily Please start Miralax 2 capfuls once daily until you have a BM, then 1 capful daily  Labwork: BMET, CBC, BNP  Testing/Procedures: Acute abdominal series  [place] caregiver has ordered a Stress Test with nuclear imaging. The purpose of this test is to evaluate the blood supply to your heart muscle. This procedure is referred to as a "Non-Invasive Stress Test." This is because other than having an IV started in your vein, nothing is inserted or "invades" your body. Cardiac stress tests are done to find areas of poor blood flow to the heart by determining the extent of coronary artery disease (CAD). Some patients exercise on a treadmill, which naturally increases the blood flow to your heart, while others who are  unable to walk on a treadmill due to physical limitations have a pharmacologic/chemical stress agent called Lexiscan . This medicine will mimic walking on a treadmill by temporarily increasing your coronary blood flow.   Please note: these test may take anywhere between 2-4 hours to complete  PLEASE REPORT TO [place] AT THE FIRST DESK WILL DIRECT YOU WHERE TO GO  Date of Procedure:________Wednesday, May 11____  Arrival Time for Procedure:_____11:30 am_________   PLEASE NOTIFY THE OFFICE AT LEAST 24 HOURS IN ADVANCE IF YOU ARE UNABLE TO KEEP YOUR APPOINTMENT.  [phone] AND  PLEASE NOTIFY NUCLEAR MEDICINE AT [facility] AT LEAST 24 HOURS IN ADVANCE IF YOU ARE UNABLE TO KEEP YOUR APPOINTMENT. [phone]  How to prepare for your Myoview test:   Do not eat or drink after midnight  No caffeine for 24 hours prior to test  No smoking 24 hours prior to test.  Your medication may be taken with water.  If your doctor stopped a medication because of this test, do not take that medication.  Ladies, please do not wear dresses.   Skirts or pants are appropriate. Please wear a short sleeve shirt.  No perfume, cologne or lotion.  Cardiac Nuclear Scanning A cardiac nuclear scan is used to check your heart for problems, such as the following:  A portion of the heart is not getting enough blood.  Part of the heart muscle has died, which happens with a heart attack.  The heart wall is not working normally.  In this test, a radioactive dye (tracer) is injected into your bloodstream. After the tracer has traveled to your heart, a scanning device is used to measure how much of the tracer is absorbed by or distributed to various areas of your heart. LET [facility] CARE PROVIDER KNOW ABOUT:  Any allergies you have.  All medicines you are taking, including vitamins, herbs, eye drops, creams, and over-the-counter medicines.  Previous problems you or members of your family have had with the use of anesthetics.  Any blood disorders you have.  Previous surgeries you have had.  Medical conditions you have.  RISKS AND COMPLICATIONS Generally, this is a safe procedure. However, as with any procedure, problems can occur. Possible problems include:   Serious chest pain.  Rapid heartbeat.  Sensation of warmth in your chest. This usually passes quickly. BEFORE THE PROCEDURE Ask your health care provider about changing or stopping your regular medicines. PROCEDURE This procedure is usually done at a hospital and takes 2-4 hours.  An IV tube is inserted into one of your veins.  Your health care provider will inject a small amount  of radioactive tracer through the tube.  You will then wait for 20-40 minutes while the tracer travels through your bloodstream.  You will lie down on an exam table so images of your heart can be taken. Images will be taken for about 15-20 minutes.  You will exercise on a treadmill or stationary bike. While you exercise, your heart activity will be monitored with an electrocardiogram (ECG),  and your blood pressure will be checked.  If you are unable to exercise, you may be given a medicine to make your heart beat faster.  When blood flow to your heart has peaked, tracer will again be injected through the IV tube.  After 20-40 minutes, you will get back on the exam table and have more images taken of your heart.  When the procedure is over, your IV tube will be removed. AFTER THE PROCEDURE  You will likely be able to leave shortly after the test. Unless your health care provider tells you otherwise, you may return to your normal schedule, including diet, activities, and medicines.  Make sure you find out how and when you will get your test results. Document Released: 11/28/2004 Document Revised: 11/08/2013 Document Reviewed: 10/12/2013 [facility] Patient Information 2015 [place], [place]. This information is not intended to replace advice given to you by your health care provider. Make sure you discuss any questions you have with your health care provider.

## 2015-03-26 NOTE — Telephone Encounter (Signed)
Still having fluid and pains in the chest and back.  Nothing has changed.  Called over the weekend and spoke to PA  He doubled the medication and it is still not working Please advise.

## 2015-03-27 DIAGNOSIS — Z9889 Other specified postprocedural states: Secondary | ICD-10-CM

## 2015-03-27 DIAGNOSIS — K5900 Constipation, unspecified: Secondary | ICD-10-CM | POA: Insufficient documentation

## 2015-03-27 DIAGNOSIS — Z8679 Personal history of other diseases of the circulatory system: Secondary | ICD-10-CM | POA: Insufficient documentation

## 2015-03-27 DIAGNOSIS — I495 Sick sinus syndrome: Secondary | ICD-10-CM | POA: Insufficient documentation

## 2015-03-28 ENCOUNTER — Telehealth: Payer: Self-pay | Admitting: Internal Medicine

## 2015-03-28 ENCOUNTER — Ambulatory Visit (INDEPENDENT_AMBULATORY_CARE_PROVIDER_SITE_OTHER): Payer: Medicare Other | Admitting: *Deleted

## 2015-03-28 ENCOUNTER — Ambulatory Visit
Admission: RE | Admit: 2015-03-28 | Discharge: 2015-03-28 | Disposition: A | Discharge status: Home / Self Care | Payer: Medicare Other | Source: Ambulatory Visit | Attending: Physician Assistant | Admitting: Physician Assistant

## 2015-03-28 ENCOUNTER — Encounter
Admission: RE | Admit: 2015-03-28 | Discharge: 2015-03-28 | Disposition: A | Discharge status: Home / Self Care | Payer: Medicare Other | Source: Ambulatory Visit | Attending: Physician Assistant | Admitting: Physician Assistant

## 2015-03-28 DIAGNOSIS — R142 Eructation: Secondary | ICD-10-CM | POA: Diagnosis present

## 2015-03-28 DIAGNOSIS — R1013 Epigastric pain: Secondary | ICD-10-CM | POA: Diagnosis present

## 2015-03-28 DIAGNOSIS — R079 Chest pain, unspecified: Secondary | ICD-10-CM | POA: Diagnosis not present

## 2015-03-28 DIAGNOSIS — I495 Sick sinus syndrome: Secondary | ICD-10-CM | POA: Diagnosis not present

## 2015-03-28 DIAGNOSIS — M25512 Pain in left shoulder: Secondary | ICD-10-CM | POA: Diagnosis not present

## 2015-03-28 MED ORDER — TECHNETIUM TC 99M SESTAMIBI GENERIC - CARDIOLITE
13.2470 | Freq: Once | INTRAVENOUS | Status: AC | PRN
  Administered 2015-03-28: 13.247 via INTRAVENOUS

## 2015-03-28 MED ORDER — TECHNETIUM TC 99M SESTAMIBI - CARDIOLITE
32.1500 | Freq: Once | INTRAVENOUS | Status: AC | PRN
  Administered 2015-03-28: 32.15 via INTRAVENOUS

## 2015-03-28 MED ORDER — REGADENOSON 0.4 MG/5ML IV SOLN
0.4000 mg | Freq: Once | INTRAVENOUS | Status: AC
  Administered 2015-03-28: 0.4 mg via INTRAVENOUS

## 2015-03-28 NOTE — Telephone Encounter (Signed)
Informed pt daughter that transmission was received.

## 2015-03-28 NOTE — Progress Notes (Signed)
Remote pacemaker transmission.   

## 2015-03-28 NOTE — Telephone Encounter (Signed)
New message      Please call to help with remote transmission

## 2015-03-29 LAB — NM MYOCAR MULTI W/SPECT W/WALL MOTION / EF
CHL CUP NUCLEAR SDS: 3
CHL CUP NUCLEAR SRS: 6
LV dias vol: 66 mL
LV sys vol: 21 mL
NUC STRESS TID: 1
SSS: 3

## 2015-03-29 LAB — CUP PACEART REMOTE DEVICE CHECK
Battery Impedance: 3061 Ohm
Battery Remaining Longevity: 21 mo
Brady Statistic RV Percent Paced: 100 %
Date Time Interrogation Session: 20160511114031
Lead Channel Impedance Value: 0 Ohm
Lead Channel Setting Pacing Amplitude: 2 V
MDC IDC MSMT BATTERY VOLTAGE: 2.72 V
MDC IDC MSMT LEADCHNL RV IMPEDANCE VALUE: 642 Ohm
MDC IDC MSMT LEADCHNL RV PACING THRESHOLD AMPLITUDE: 1 V
MDC IDC MSMT LEADCHNL RV PACING THRESHOLD PULSEWIDTH: 0.4 ms
MDC IDC SET LEADCHNL RV PACING PULSEWIDTH: 0.4 ms
MDC IDC SET LEADCHNL RV SENSING SENSITIVITY: 5.6 mV

## 2015-04-02 ENCOUNTER — Encounter: Payer: Medicare Other | Admitting: Surgery

## 2015-04-02 ENCOUNTER — Encounter: Payer: Self-pay | Admitting: Gastroenterology

## 2015-04-02 DIAGNOSIS — L97222 Non-pressure chronic ulcer of left calf with fat layer exposed: Secondary | ICD-10-CM | POA: Diagnosis not present

## 2015-04-03 NOTE — Progress Notes (Signed)
[name], [name] ([id number]) Visit Report for 04/02/2015 Arrival Information Details Patient Name: [name] Date of Service: 04/02/2015 2:30 PM Medical Record Number: [id number] Patient Account Number: [id number] Date of Birth/Sex: 1922/04/11 (81 y.o. Female) Treating RN: Cornell Barman Primary Care Physician: Pernell Dupre Other Clinician: Referring Physician: Pernell Dupre Treating Physician/Extender: Frann Rider in Treatment: 7 Visit Information History Since Last Visit Added or deleted any medications: No Patient Arrived: Wheel Chair Any new allergies or adverse reactions: No Arrival Time: 14:34 Had a fall or experienced change in No Accompanied By: daughter activities of daily living that may affect Transfer Assistance: Manual risk of falls: Patient Identification Verified: Yes Signs or symptoms of abuse/neglect since last No Secondary Verification Yes visito Process Completed: Hospitalized since last visit: No Patient Requires No Has Dressing in Place as Prescribed: Yes Transmission-Based Pain Present Now: No Precautions: Patient Has Alerts: Yes Patient Alerts: Patient on Blood Thinner ABI,L:1.10,R:1.10 Electronic Signature(s) Signed: 04/02/2015 4:47:02 PM By: Gretta Cool, RN, BSN, Kim RN, BSN Entered By: Gretta Cool, RN, BSN, Kim on 04/02/2015 14:38:21 [name] ([id number]) -------------------------------------------------------------------------------- Clinic Level of Care Assessment Details Patient Name: [name] Date of Service: 04/02/2015 2:30 PM Medical Record Number: [id number] Patient Account Number: [id number] Date of Birth/Sex: 21-Aug-1922 (81 y.o. Female) Treating RN: Cornell Barman Primary Care Physician: Pernell Dupre Other Clinician: Referring Physician: Pernell Dupre Treating Physician/Extender: Frann Rider in Treatment: 7 Clinic Level of Care Assessment Items TOOL 4 Quantity Score []  - Use when only an  EandM is performed on FOLLOW-UP visit 0 ASSESSMENTS - Nursing Assessment / Reassessment []  - Reassessment of Co-morbidities (includes updates in patient status) 0 X - Reassessment of Adherence to Treatment Plan 1 5 ASSESSMENTS - Wound and Skin Assessment / Reassessment X - Simple Wound Assessment / Reassessment - one wound 1 5 []  - Complex Wound Assessment / Reassessment - multiple wounds 0 []  - Dermatologic / Skin Assessment (not related to wound area) 0 ASSESSMENTS - Focused Assessment []  - Circumferential Edema Measurements - multi extremities 0 []  - Nutritional Assessment / Counseling / Intervention 0 []  - Lower Extremity Assessment (monofilament, tuning fork, pulses) 0 []  - Peripheral Arterial Disease Assessment (using hand held doppler) 0 ASSESSMENTS - Ostomy and/or Continence Assessment and Care []  - Incontinence Assessment and Management 0 []  - Ostomy Care Assessment and Management (repouching, etc.) 0 PROCESS - Coordination of Care X - Simple Patient / Family Education for ongoing care 1 15 []  - Complex (extensive) Patient / Family Education for ongoing care 0 []  - Staff obtains Programmer, systems, Records, Test Results / Process Orders 0 []  - Staff telephones HHA, Nursing Homes / Clarify orders / etc 0 X - Routine Transfer to another Facility (non-emergent condition) 1 10 [name], [name] ([id number]) []  - Routine Hospital Admission (non-emergent condition) 0 []  - New Admissions / Biomedical engineer / Ordering NPWT, Apligraf, etc. 0 []  - Emergency Hospital Admission (emergent condition) 0 X - Simple Discharge Coordination 1 10 []  - Complex (extensive) Discharge Coordination 0 PROCESS - Special Needs []  - Pediatric / Minor Patient Management 0 []  - Isolation Patient Management 0 []  - Hearing / Language / Visual special needs 0 []  - Assessment of Community assistance (transportation, D/C planning, etc.) 0 []  - Additional assistance / Altered mentation 0 []  - Support Surface(s)  Assessment (bed, cushion, seat, etc.) 0 INTERVENTIONS - Wound Cleansing / Measurement X - Simple Wound Cleansing - one wound 1 5 []  - Complex Wound Cleansing - multiple wounds 0 X -  Wound Imaging (photographs - any number of wounds) 1 5 []  - Wound Tracing (instead of photographs) 0 X - Simple Wound Measurement - one wound 1 5 []  - Complex Wound Measurement - multiple wounds 0 INTERVENTIONS - Wound Dressings []  - Small Wound Dressing one or multiple wounds 0 X - Medium Wound Dressing one or multiple wounds 1 15 []  - Large Wound Dressing one or multiple wounds 0 []  - Application of Medications - topical 0 []  - Application of Medications - injection 0 INTERVENTIONS - Miscellaneous []  - External ear exam 0 [name], [name] ([id number]) []  - Specimen Collection (cultures, biopsies, blood, body fluids, etc.) 0 []  - Specimen(s) / Culture(s) sent or taken to Lab for analysis 0 []  - Patient Transfer (multiple staff / Harrel Lemon Lift / Similar devices) 0 []  - Simple Staple / Suture removal (25 or less) 0 []  - Complex Staple / Suture removal (26 or more) 0 []  - Hypo / Hyperglycemic Management (close monitor of Blood Glucose) 0 []  - Ankle / Brachial Index (ABI) - do not check if billed separately 0 X - Vital Signs 1 5 Has the patient been seen at the hospital within the last three years: Yes Total Score: 80 Level Of Care: New/Established - Level 3 Electronic Signature(s) Signed: 04/02/2015 4:47:02 PM By: Gretta Cool, RN, BSN, Kim RN, BSN Entered By: Gretta Cool, RN, BSN, Kim on 04/02/2015 15:02:11 [name] ([id number]) -------------------------------------------------------------------------------- Encounter Discharge Information Details Patient Name: [name] Date of Service: 04/02/2015 2:30 PM Medical Record Number: [id number] Patient Account Number: [id number] Date of Birth/Sex: 03-11-1922 (81 y.o. Female) Treating RN: Cornell Barman Primary Care Physician: Pernell Dupre Other  Clinician: Referring Physician: Pernell Dupre Treating Physician/Extender: Frann Rider in Treatment: 7 Encounter Discharge Information Items Discharge Pain Level: 0 Discharge Condition: Stable Ambulatory Status: Wheelchair Discharge Destination: Home Transportation: Other Accompanied By: daughter Schedule Follow-up Appointment: Yes Medication Reconciliation completed and provided to Patient/Care Yes Makynzee Tigges: Provided on Clinical Summary of Care: 04/02/2015 Form Type Recipient Paper Patient JW Electronic Signature(s) Signed: 04/02/2015 4:47:02 PM By: Gretta Cool, RN, BSN, Kim RN, BSN Previous Signature: 04/02/2015 2:58:38 PM Version By: Ruthine Dose Entered By: Gretta Cool RN, BSN, Kim on 04/02/2015 15:03:22 [name] ([id number]) -------------------------------------------------------------------------------- Lower Extremity Assessment Details Patient Name: [name] Date of Service: 04/02/2015 2:30 PM Medical Record Number: [id number] Patient Account Number: [id number] Date of Birth/Sex: 1921-12-31 (81 y.o. Female) Treating RN: Cornell Barman Primary Care Physician: Pernell Dupre Other Clinician: Referring Physician: Pernell Dupre Treating Physician/Extender: Frann Rider in Treatment: 7 Edema Assessment Assessed: [Left: No] [Right: No] E[Left: dema] [Right: :] Calf Left: Right: Point of Measurement: 32 cm From Medial Instep 36 cm cm Ankle Left: Right: Point of Measurement: 12 cm From Medial Instep 21.5 cm cm Vascular Assessment Pulses: Posterior Tibial Dorsalis Pedis Palpable: [Left:Yes] Extremity colors, hair growth, and conditions: Extremity Color: [Left:Hyperpigmented] Hair Growth on Extremity: [Left:Yes] Temperature of Extremity: [Left:Warm] Capillary Refill: [Left:< 3 seconds] Toe Nail Assessment Left: Right: Thick: Yes Discolored: Yes Deformed: Yes Improper Length and Hygiene: No Electronic Signature(s) Signed: 04/02/2015  4:47:02 PM By: Gretta Cool, RN, BSN, Kim RN, BSN Entered By: Gretta Cool, RN, BSN, Kim on 04/02/2015 14:40:40 [name] ([id number]) -------------------------------------------------------------------------------- Multi Wound Chart Details Patient Name: [name] Date of Service: 04/02/2015 2:30 PM Medical Record Number: [id number] Patient Account Number: [id number] Date of Birth/Sex: [birth year] (81 y.o. Female) Treating RN: Cornell Barman Primary Care Physician: Pernell Dupre Other Clinician: Referring Physician: Pernell Dupre Treating Physician/Extender: Frann Rider  in Treatment: 7 Vital Signs Height(in): 63 Pulse(bpm): 78 Weight(lbs): 148 Blood Pressure 139/75 (mmHg): Body Mass Index(BMI): 26 Temperature(F): 97.6 Respiratory Rate 18 (breaths/min): Photos: [3:No Photos] [N/A:N/A] Wound Location: [3:Left Lower Leg - Posterior N/A] Wounding Event: [3:Gradually Appeared] [N/A:N/A] Primary Etiology: [3:Infection - not elsewhere N/A classified] Comorbid History: [3:Cataracts, Angina, Arrhythmia, Congestive Heart Failure, Hypertension, Hepatitis B, Osteoarthritis, Seizure Disorder] [N/A:N/A] Date Acquired: [3:01/08/2015] [N/A:N/A] Weeks of Treatment: [3:7] [N/A:N/A] Wound Status: [3:Open] [N/A:N/A] Measurements L x W x D 1x0.9x0.2 [N/A:N/A] (cm) Area (cm) : [3:0.707] [N/A:N/A] Volume (cm) : [3:0.141] [N/A:N/A] % Reduction in Area: [3:82.00%] [N/A:N/A] % Reduction in Volume: 82.00% [N/A:N/A] Classification: [3:Full Thickness Without Exposed Support Structures] [N/A:N/A] Exudate Amount: [3:Medium] [N/A:N/A] Exudate Type: [3:Serosanguineous] [N/A:N/A] Exudate Color: [3:red, brown] [N/A:N/A] Wound Margin: [3:Indistinct, nonvisible] [N/A:N/A] Granulation Amount: [3:Large (67-100%)] [N/A:N/A] Granulation Quality: [3:Red, Hyper-granulation] [N/A:N/A] Necrotic Amount: [3:None Present (0%)] [N/A:N/A] Exposed Structures: Fascia: No N/A N/A Fat: No Tendon:  No Muscle: No Joint: No Bone: No Limited to Skin Breakdown Epithelialization: Small (1-33%) N/A N/A Periwound Skin Texture: Edema: No N/A N/A Excoriation: No Induration: No Callus: No Crepitus: No Fluctuance: No Friable: No Rash: No Scarring: No Periwound Skin Moist: Yes N/A N/A Moisture: Maceration: No Dry/Scaly: No Periwound Skin Color: Atrophie Blanche: No N/A N/A Cyanosis: No Ecchymosis: No Erythema: No Hemosiderin Staining: No Mottled: No Pallor: No Rubor: No Temperature: No Abnormality N/A N/A Tenderness on Yes N/A N/A Palpation: Wound Preparation: Ulcer Cleansing: N/A N/A Rinsed/Irrigated with Saline Topical Anesthetic Applied: Other: lidocaine 4% Treatment Notes Electronic Signature(s) Signed: 04/02/2015 4:47:02 PM By: Gretta Cool, RN, BSN, Kim RN, BSN Entered By: Gretta Cool, RN, BSN, Kim on 04/02/2015 14:48:17 [name] ([id number]) -------------------------------------------------------------------------------- [place] Details Patient Name: [name], [name] Date of Service: 04/02/2015 2:30 PM Medical Record Number: [id number] Patient Account Number: [id number] Date of Birth/Sex: 1922/02/19 (81 y.o. Female) Treating RN: Cornell Barman Primary Care Physician: Pernell Dupre Other Clinician: Referring Physician: Pernell Dupre Treating Physician/Extender: Frann Rider in Treatment: 7 Active Inactive Abuse / Safety / Falls / Self Care Management Nursing Diagnoses: Impaired physical mobility Potential for falls Goals: Patient will remain injury free Date Initiated: 02/06/2015 Goal Status: Active Patient/caregiver will verbalize understanding of skin care regimen Date Initiated: 02/06/2015 Goal Status: Active Patient/caregiver will verbalize/demonstrate measures taken to prevent injury and/or falls Date Initiated: 02/06/2015 Goal Status: Active Interventions: Provide education on fall prevention Provide education on  safe transfers Notes: Orientation to the Wound Care Program Nursing Diagnoses: Knowledge deficit related to the wound healing center program Goals: Patient/caregiver will verbalize understanding of the [place] Program Date Initiated: 02/06/2015 Goal Status: Active Interventions: Provide education on orientation to the wound center Notes: [name], [name] ([id number]) Wound/Skin Impairment Nursing Diagnoses: Impaired tissue integrity Knowledge deficit related to ulceration/compromised skin integrity Goals: Patient/caregiver will verbalize understanding of skin care regimen Date Initiated: 02/06/2015 Goal Status: Active Ulcer/skin breakdown will heal within 14 weeks Date Initiated: 02/06/2015 Goal Status: Active Interventions: Assess patient/caregiver ability to obtain necessary supplies Assess patient/caregiver ability to perform ulcer/skin care regimen upon admission and as needed Assess ulceration(s) every visit Provide education on ulcer and skin care Treatment Activities: Skin care regimen initiated : 04/02/2015 Topical wound management initiated : 04/02/2015 Notes: Electronic Signature(s) Signed: 04/02/2015 4:47:02 PM By: Gretta Cool, RN, BSN, Kim RN, BSN Entered By: Gretta Cool, RN, BSN, Kim on 04/02/2015 14:48:03 [name] ([id number]) -------------------------------------------------------------------------------- Pain Assessment Details Patient Name: [name] Date of Service: 04/02/2015 2:30 PM Medical Record Number: [id number] Patient Account  Number: [id number] Date of Birth/Sex: 09-Mar-1922 (81 y.o. Female) Treating RN: Cornell Barman Primary Care Physician: Pernell Dupre Other Clinician: Referring Physician: Pernell Dupre Treating Physician/Extender: Frann Rider in Treatment: 7 Active Problems Location of Pain Severity and Description of Pain Patient Has Paino No Site Locations Pain Management and Medication Current Pain  Management: Electronic Signature(s) Signed: 04/02/2015 4:47:02 PM By: Gretta Cool, RN, BSN, Kim RN, BSN Entered By: Gretta Cool, RN, BSN, Kim on 04/02/2015 14:38:27 [name] ([id number]) -------------------------------------------------------------------------------- Patient/Caregiver Education Details Patient Name: [name] Date of Service: 04/02/2015 2:30 PM Medical Record Number: [id number] Patient Account Number: [id number] Date of Birth/Gender: Jan 19, 1922 (81 y.o. Female) Treating RN: Cornell Barman Primary Care Physician: Pernell Dupre Other Clinician: Referring Physician: Pernell Dupre Treating Physician/Extender: Frann Rider in Treatment: 7 Education Assessment Education Provided To: Patient Education Topics Provided Wound/Skin Impairment: Handouts: Caring for Your Ulcer, Skin Care Do's and Dont's, Other: wound dressing application Methods: Demonstration Responses: State content correctly Electronic Signature(s) Signed: 04/02/2015 4:47:02 PM By: Gretta Cool, RN, BSN, Kim RN, BSN Entered By: Gretta Cool, RN, BSN, Kim on 04/02/2015 15:03:59 [name] ([id number]) -------------------------------------------------------------------------------- Wound Assessment Details Patient Name: [name] Date of Service: 04/02/2015 2:30 PM Medical Record Number: [id number] Patient Account Number: [id number] Date of Birth/Sex: 1922/06/27 (81 y.o. Female) Treating RN: Cornell Barman Primary Care Physician: Pernell Dupre Other Clinician: Referring Physician: Pernell Dupre Treating Physician/Extender: Frann Rider in Treatment: 7 Wound Status Wound Number: 3 Primary Infection - not elsewhere classified Etiology: Wound Location: Left Lower Leg - Posterior Wound Open Wounding Event: Gradually Appeared Status: Date Acquired: 01/08/2015 Comorbid Cataracts, Angina, Arrhythmia, Weeks Of Treatment: 7 History: Congestive Heart Failure,  Hypertension, Clustered Wound: No Hepatitis B, Osteoarthritis, Seizure Disorder Photos Photo Uploaded By: Gretta Cool, RN, BSN, Kim on 04/02/2015 16:46:38 Wound Measurements Length: (cm) 1 Width: (cm) 0.9 Depth: (cm) 0.2 Area: (cm) 0.707 Volume: (cm) 0.141 % Reduction in Area: 82% % Reduction in Volume: 82% Epithelialization: Small (1-33%) Tunneling: No Wound Description Full Thickness Without Exposed Foul Odor A Classification: Support Structures Wound Margin: Indistinct, nonvisible Exudate Medium Amount: Exudate Type: Serosanguineous Exudate Color: red, brown fter Cleansing: No Wound Bed Granulation Amount: Large (67-100%) Exposed Structure [name], [name] ([id number]) Granulation Quality: Red, Hyper-granulation Fascia Exposed: No Necrotic Amount: None Present (0%) Fat Layer Exposed: No Tendon Exposed: No Muscle Exposed: No Joint Exposed: No Bone Exposed: No Limited to Skin Breakdown Periwound Skin Texture Texture Color No Abnormalities Noted: No No Abnormalities Noted: No Callus: No Atrophie Blanche: No Crepitus: No Cyanosis: No Excoriation: No Ecchymosis: No Fluctuance: No Erythema: No Friable: No Hemosiderin Staining: No Induration: No Mottled: No Localized Edema: No Pallor: No Rash: No Rubor: No Scarring: No Temperature / Pain Moisture Temperature: No Abnormality No Abnormalities Noted: No Tenderness on Palpation: Yes Dry / Scaly: No Maceration: No Moist: Yes Wound Preparation Ulcer Cleansing: Rinsed/Irrigated with Saline Topical Anesthetic Applied: Other: lidocaine 4%, Treatment Notes Wound #3 (Left, Posterior Lower Leg) 1. Cleansed with: Clean wound with Normal Saline 2. Anesthetic Topical Lidocaine 4% cream to wound bed prior to debridement 4. Dressing Applied: Contact layer 5. Secondary Dressing Applied Bordered Foam Dressing Dry Gauze Electronic Signature(s) Signed: 04/02/2015 4:47:02 PM By: Gretta Cool, RN, BSN, Kim RN,  BSN Entered By: Gretta Cool, RN, BSN, Kim on 04/02/2015 14:43:07 [name], [name] ([id number]) [name], [name] ([id number]) -------------------------------------------------------------------------------- [place] Details Patient Name: [name] Date of Service: 04/02/2015 2:30 PM Medical Record Number: [id number] Patient Account Number: [id number] Date of Birth/Sex: 1922/03/03 (81 y.o. Female)  Treating RN: Cornell Barman Primary Care Physician: Pernell Dupre Other Clinician: Referring Physician: Pernell Dupre Treating Physician/Extender: Frann Rider in Treatment: 7 Vital Signs Time Taken: 14:38 Temperature (F): 97.6 Height (in): 63 Pulse (bpm): 78 Weight (lbs): 148 Respiratory Rate (breaths/min): 18 Body Mass Index (BMI): 26.2 Blood Pressure (mmHg): 139/75 Reference Range: 80 - 120 mg / dl Electronic Signature(s) Signed: 04/02/2015 4:47:02 PM By: Gretta Cool, RN, BSN, Kim RN, BSN Entered By: Gretta Cool, RN, BSN, Kim on 04/02/2015 14:38:52

## 2015-04-03 NOTE — Progress Notes (Signed)
GHINA, BITTINGER ([id number]) Visit Report for 04/02/2015 Chief Complaint Document Details Patient Name: [name], [name]. Date of Service: 04/02/2015 2:30 PM Medical Record Number: [id number] Patient Account Number: [id number] Date of Birth/Sex: January 14, 1922 (81 y.o. Female) Treating RN: Cornell Barman Primary Care Physician: Pernell Dupre Other Clinician: Referring Physician: Pernell Dupre Treating Physician/Extender: Frann Rider in Treatment: 7 Information Obtained from: Patient Chief Complaint Patient returns to the wound care center for new ulcer to: left lower extremity lateral part of the calf. This has been there for about a month. Electronic Signature(s) Signed: 04/02/2015 1:32:08 PM By: Christin Fudge MD, FACS Entered By: Christin Fudge on 04/02/2015 15:04:17 [name] ([id number]) -------------------------------------------------------------------------------- HPI Details Patient Name: [name] Date of Service: 04/02/2015 2:30 PM Medical Record Number: [id number] Patient Account Number: [id number] Date of Birth/Sex: 1922-09-01 (81 y.o. Female) Treating RN: Cornell Barman Primary Care Physician: Pernell Dupre Other Clinician: Referring Physician: Pernell Dupre Treating Physician/Extender: Frann Rider in Treatment: 7 History of Present Illness HPI Description: The patient is a pleasant 81 year old with a past medical history significant for aortic valve replacement (on Coumadin), pacemaker, CHF, abdominal aortic aneurysm, and CVA. No history of diabetes or peripheral vascular disease. She underwent excision of a squamous cell cancer on her right upper arm and left dorsal foot in early August 2015 at the [facility]. Shave biopsy on May 11, 2014 showed "superficially invasive squamous cell carcinoma". She subsequently underwent wide excision with clear margins. She presented to the wound clinic with full thickness  open surgical wounds. No symptoms to suggest ischemic rest pain or claudication. ABI 1.1. Cultures of L foot on August 09, 2014 grew ORSA sensitive to tigecycline. She has completed a course of doxycycline. 02/06/2015. -- This time around she had multiple furuncles over her left lower extremity and some on her right too. Was treated by her primary care physician with doxycycline and also was treated for shingles. Most of these ulcerations had healed primarily but the one on her left lower extremity lateral part is open and has continued to drain for about a month. She has minimal pain over this area. She has been using some local ointment and a dressing has been put over this. Last year she had a full vascular workup at [facility] in [place] and saw Dr. Donnetta Hutching for an opinion regarding arterial insufficiency. though she had significant arterial insufficiency a major surgical procedure was not recommended because of her age. Conservative therapy was recommended. The patient has had some vascular vein surgery done about 6 years ago in this hospital. 03/26/2015 -- she now has several other issues going with a cardiac problem and has been quite worried about this. Her leg is doing fine otherwise. 04/02/2015 -- all her chest and abdominal pain turned out to be just chronic constipation and a cardiac workup was fine. She is much relieved about the news. Electronic Signature(s) Signed: 04/02/2015 1:32:08 PM By: Christin Fudge MD, FACS Entered By: Christin Fudge on 04/02/2015 15:04:52 [name] ([id number]) -------------------------------------------------------------------------------- Otelia Sergeant TISS Details Patient Name: [name] Date of Service: 04/02/2015 2:30 PM Medical Record Number: [id number] Patient Account Number: [id number] Date of Birth/Sex: September 19, 1922 (81 y.o. Female) Treating RN: Cornell Barman Primary Care Physician: Pernell Dupre Other  Clinician: Referring Physician: Pernell Dupre Treating Physician/Extender: Frann Rider in Treatment: 7 Procedure Performed for: Wound #3 Left,Posterior Lower Leg Performed By: Physician Pat Patrick., MD Notes There is good epithelialization but there is some  area in the center which has hyper granulation tissue and I shall cauterize this with a silver nitrate stick. Electronic Signature(s) Signed: 04/02/2015 1:32:08 PM By: Christin Fudge MD, FACS Entered By: Christin Fudge on 04/02/2015 15:04:03 [name] ([id number]) -------------------------------------------------------------------------------- Physical Exam Details Patient Name: [name] Date of Service: 04/02/2015 2:30 PM Medical Record Number: [id number] Patient Account Number: [id number] Date of Birth/Sex: 02-04-1922 (81 y.o. Female) Treating RN: Cornell Barman Primary Care Physician: Pernell Dupre Other Clinician: Referring Physician: Pernell Dupre Treating Physician/Extender: Frann Rider in Treatment: 7 Constitutional . Pulse regular. Respirations normal and unlabored. Afebrile. . Eyes Nonicteric. Reactive to light. Ears, Nose, Mouth, and Throat Lips, teeth, and gums WNL.[name] Moist mucosa without lesions . Neck supple and nontender. No palpable supraclavicular or cervical adenopathy. Normal sized without goiter. Respiratory WNL. No retractions.. Cardiovascular Pedal Pulses WNL. No clubbing, cyanosis or edema. Integumentary (Hair, Skin) wound looks very good except for some hypergranulation tissue which will need a silver nitrate cautery. No crepitus or fluctuance. No peri-wound warmth or erythema. No masses.[name] Psychiatric Judgement and insight Intact.. No evidence of depression, anxiety, or agitation.. Electronic Signature(s) Signed: 04/02/2015 1:32:08 PM By: Christin Fudge MD, FACS Entered By: Christin Fudge on 04/02/2015 15:09:05 [name]  ([id number]) -------------------------------------------------------------------------------- Physician Orders Details Patient Name: [name] Date of Service: 04/02/2015 2:30 PM Medical Record Number: [id number] Patient Account Number: [id number] Date of Birth/Sex: 06-13-1922 (81 y.o. Female) Treating RN: Cornell Barman Primary Care Physician: Pernell Dupre Other Clinician: Referring Physician: Pernell Dupre Treating Physician/Extender: Frann Rider in Treatment: 7 Verbal / Phone Orders: No Diagnosis Coding Wound Cleansing Wound #3 Left,Posterior Lower Leg o Clean wound with Normal Saline. o Cleanse wound with mild soap and water o May Shower, gently pat wound dry prior to applying new dressing. o May shower with protection. Anesthetic Wound #3 Left,Posterior Lower Leg o Topical Lidocaine 4% cream applied to wound bed prior to debridement Primary Wound Dressing Wound #3 Left,Posterior Lower Leg o Contact layer Secondary Dressing Wound #3 Left,Posterior Lower Leg o Dry Gauze - bolster with gauze o Boardered Foam Dressing Dressing Change Frequency Wound #3 Left,Posterior Lower Leg o Change dressing every other day. Follow-up Appointments Wound #3 Left,Posterior Lower Leg o Return Appointment in 1 week. Electronic Signature(s) Signed: 04/02/2015 1:32:08 PM By: Christin Fudge MD, FACS Signed: 04/02/2015 4:47:02 PM By: Gretta Cool RN, BSN, Kim RN, BSN Entered By: Gretta Cool, RN, BSN, Kim on 04/02/2015 15:01:38 [name], [name] ([id number]) [name], [name] ([id number]) -------------------------------------------------------------------------------- Problem List Details Patient Name: [name], [name] Date of Service: 04/02/2015 2:30 PM Medical Record Number: [id number] Patient Account Number: [id number] Date of Birth/Sex: 1922/08/27 (81 y.o. Female) Treating RN: Cornell Barman Primary Care Physician: Pernell Dupre Other Clinician: Referring  Physician: Pernell Dupre Treating Physician/Extender: Frann Rider in Treatment: 7 Active Problems ICD-10 Encounter Code Description Active Date Diagnosis L97.222 Non-pressure chronic ulcer of left calf with fat layer 02/06/2015 Yes exposed I70.248 Atherosclerosis of native arteries of left leg with ulceration 02/06/2015 Yes of other part of lower left leg Inactive Problems Resolved Problems ICD-10 Code Description Active Date Resolved Date L02.426 Furuncle of left lower limb 02/06/2015 02/06/2015 L02.425 Furuncle of right lower limb 02/06/2015 02/06/2015 Electronic Signature(s) Signed: 04/02/2015 1:32:08 PM By: Christin Fudge MD, FACS Entered By: Christin Fudge on 04/02/2015 15:03:27 [name] ([id number]) -------------------------------------------------------------------------------- Progress Note Details Patient Name: [name] Date of Service: 04/02/2015 2:30 PM Medical Record Number: [id number] Patient Account Number: [id number] Date of Birth/Sex: [birth year] (81 y.o.  Female) Treating RN: Cornell Barman Primary Care Physician: Pernell Dupre Other Clinician: Referring Physician: Pernell Dupre Treating Physician/Extender: Frann Rider in Treatment: 7 Subjective Chief Complaint Information obtained from Patient Patient returns to the wound care center for new ulcer to: left lower extremity lateral part of the calf. This has been there for about a month. History of Present Illness (HPI) The patient is a pleasant 81 year old with a past medical history significant for aortic valve replacement (on Coumadin), pacemaker, CHF, abdominal aortic aneurysm, and CVA. No history of diabetes or peripheral vascular disease. She underwent excision of a squamous cell cancer on her right upper arm and left dorsal foot in early August 2015 at the [facility]. Shave biopsy on May 11, 2014 showed "superficially invasive squamous cell  carcinoma". She subsequently underwent wide excision with clear margins. She presented to the wound clinic with full thickness open surgical wounds. No symptoms to suggest ischemic rest pain or claudication. ABI 1.1. Cultures of L foot on August 09, 2014 grew ORSA sensitive to tigecycline. She has completed a course of doxycycline. 02/06/2015. -- This time around she had multiple furuncles over her left lower extremity and some on her right too. Was treated by her primary care physician with doxycycline and also was treated for shingles. Most of these ulcerations had healed primarily but the one on her left lower extremity lateral part is open and has continued to drain for about a month. She has minimal pain over this area. She has been using some local ointment and a dressing has been put over this. Last year she had a full vascular workup at [facility] in [place] and saw Dr. Donnetta Hutching for an opinion regarding arterial insufficiency. though she had significant arterial insufficiency a major surgical procedure was not recommended because of her age. Conservative therapy was recommended. The patient has had some vascular vein surgery done about 6 years ago in this hospital. 03/26/2015 -- she now has several other issues going with a cardiac problem and has been quite worried about this. Her leg is doing fine otherwise. 04/02/2015 -- all her chest and abdominal pain turned out to be just chronic constipation and a cardiac workup was fine. She is much relieved about the news. [name], [name] ([id number]) Objective Constitutional Pulse regular. Respirations normal and unlabored. Afebrile. Vitals Time Taken: 2:38 PM, Height: 63 in, Weight: 148 lbs, BMI: 26.2, Temperature: 97.6 F, Pulse: 78 bpm, Respiratory Rate: 18 breaths/min, Blood Pressure: 139/75 mmHg. Eyes Nonicteric. Reactive to light. Ears, Nose, Mouth, and Throat Lips, teeth, and gums WNL.[name] Moist mucosa without lesions  . Neck supple and nontender. No palpable supraclavicular or cervical adenopathy. Normal sized without goiter. Respiratory WNL. No retractions.. Cardiovascular Pedal Pulses WNL. No clubbing, cyanosis or edema. Psychiatric Judgement and insight Intact.. No evidence of depression, anxiety, or agitation.. Integumentary (Hair, Skin) wound looks very good except for some hypergranulation tissue which will need a silver nitrate cautery. No crepitus or fluctuance. No peri-wound warmth or erythema. No masses.. Wound #3 status is Open. Original cause of wound was Gradually Appeared. The wound is located on the Left,Posterior Lower Leg. The wound measures 1cm length x 0.9cm width x 0.2cm depth; 0.707cm^2 area and 0.141cm^3 volume. The wound is limited to skin breakdown. There is no tunneling noted. There is a medium amount of serosanguineous drainage noted. The wound margin is indistinct and nonvisible. There is large (67-100%) red granulation within the wound bed. There is no necrotic tissue within the wound  bed. The periwound skin appearance exhibited: Moist. The periwound skin appearance did not exhibit: Callus, Crepitus, Excoriation, Fluctuance, Friable, Induration, Localized Edema, Rash, Scarring, Dry/Scaly, Maceration, Atrophie Blanche, Cyanosis, Ecchymosis, Hemosiderin Staining, Mottled, Pallor, Rubor, Erythema. Periwound temperature was noted as No Abnormality. The periwound has tenderness on palpation. Assessment [name], [name] ([id number]) Active Problems ICD-10 [phone] - Non-pressure chronic ulcer of left calf with fat layer exposed I70.248 - Atherosclerosis of native arteries of left leg with ulceration of other part of lower left leg Wound is looking much better except for the hypergranulation tissue. I will use some compression locally and continue to see her on a weekly basis to this heals. Procedures Wound #3 Wound #3 is an Infection - not elsewhere classified located on the  Left,Posterior Lower Leg . An CHEM CAUT GRANULATION TISS procedure was performed by Vanette Noguchi, Jackson Latino., MD. Notes: There is good epithelialization but there is some area in the center which has hyper granulation tissue and I shall cauterize this with a silver nitrate stick. Plan Wound Cleansing: Wound #3 Left,Posterior Lower Leg: Clean wound with Normal Saline. Cleanse wound with mild soap and water May Shower, gently pat wound dry prior to applying new dressing. May shower with protection. Anesthetic: Wound #3 Left,Posterior Lower Leg: Topical Lidocaine 4% cream applied to wound bed prior to debridement Primary Wound Dressing: Wound #3 Left,Posterior Lower Leg: Contact layer Secondary Dressing: Wound #3 Left,Posterior Lower Leg: Dry Gauze - bolster with gauze Boardered Foam Dressing Dressing Change Frequency: Wound #3 Left,Posterior Lower Leg: Change dressing every other day. Follow-up Appointments: [name], [name] ([id number]) Wound #3 Left,Posterior Lower Leg: Return Appointment in 1 week. Wound is looking much better except for the hypergranulation tissue. I will use some compression locally and continue to see her on a weekly basis to this heals. Electronic Signature(s) Signed: 04/02/2015 1:32:08 PM By: Christin Fudge MD, FACS Entered By: Christin Fudge on 04/02/2015 15:12:43 [name] ([id number]) -------------------------------------------------------------------------------- SuperBill Details Patient Name: [name] Date of Service: 04/02/2015 Medical Record Number: [id number] Patient Account Number: [id number] Date of Birth/Sex: 1922-10-15 (81 y.o. Female) Treating RN: Cornell Barman Primary Care Physician: Pernell Dupre Other Clinician: Referring Physician: Pernell Dupre Treating Physician/Extender: Frann Rider in Treatment: 7 Diagnosis Coding ICD-10 Codes Code Description [phone] Non-pressure chronic ulcer of left calf with fat layer  exposed I70.248 Atherosclerosis of native arteries of left leg with ulceration of other part of lower left leg Facility Procedures CPT4 Code: [id number] Description: 73532 - WOUND CARE VISIT-LEV 3 EST PT Modifier: Quantity: 1 CPT4 Code: [id number] Description: 41962 - CHEM CAUT GRANULATION TISS ICD-10 Description Diagnosis L97.222 Non-pressure chronic ulcer of left calf with fat laye Modifier: r exposed Quantity: 1 Physician Procedures CPT4 Code: [id number] Description: 21194 - WC PHYS CHEM CAUT GRAN TISSUE ICD-10 Description Diagnosis L97.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

## 2015-04-09 ENCOUNTER — Encounter: Payer: Medicare Other | Admitting: Surgery

## 2015-04-09 ENCOUNTER — Encounter: Payer: Self-pay | Admitting: Cardiology

## 2015-04-09 DIAGNOSIS — L97222 Non-pressure chronic ulcer of left calf with fat layer exposed: Secondary | ICD-10-CM | POA: Diagnosis not present

## 2015-04-10 NOTE — Progress Notes (Signed)
JOURDIN, GENS ([id number]) Visit Report for 04/09/2015 Chief Complaint Document Details Patient Name: [name], [name]. Date of Service: 04/09/2015 1:15 PM Medical Record Number: [id number] Patient Account Number: [id number] Date of Birth/Sex: 1922/04/04 (81 y.o. Female) Treating RN: Aileen Fass Primary Care Physician: Pernell Dupre Other Clinician: Referring Physician: Pernell Dupre Treating Physician/Extender: Frann Rider in Treatment: 8 Information Obtained from: Patient Chief Complaint Patient returns to the wound care center for new ulcer to: left lower extremity lateral part of the calf. This has been there for about a month. About 4 days ago she injured her right calf area when she was applying some ointment and she may have scratched herself with a nail. Electronic Signature(s) Signed: 04/09/2015 5:41:46 PM By: Christin Fudge MD, FACS Entered By: Christin Fudge on 04/09/2015 14:12:58 [name] ([id number]) -------------------------------------------------------------------------------- HPI Details Patient Name: [name] Date of Service: 04/09/2015 1:15 PM Medical Record Number: [id number] Patient Account Number: [id number] Date of Birth/Sex: [birth year] (81 y.o. Female) Treating RN: Aileen Fass Primary Care Physician: Pernell Dupre Other Clinician: Referring Physician: Pernell Dupre Treating Physician/Extender: Frann Rider in Treatment: 8 History of Present Illness HPI Description: The patient is a pleasant 81 year old with a past medical history significant for aortic valve replacement (on Coumadin), pacemaker, CHF, abdominal aortic aneurysm, and CVA. No history of diabetes or peripheral vascular disease. She underwent excision of a squamous cell cancer on her right upper arm and left dorsal foot in early August 2015 at the [facility]. Shave biopsy on May 11, 2014 showed "superficially  invasive squamous cell carcinoma". She subsequently underwent wide excision with clear margins. She presented to the wound clinic with full thickness open surgical wounds. No symptoms to suggest ischemic rest pain or claudication. ABI 1.1. Cultures of L foot on August 09, 2014 grew ORSA sensitive to tigecycline. She has completed a course of doxycycline. 02/06/2015. -- This time around she had multiple furuncles over her left lower extremity and some on her right too. Was treated by her primary care physician with doxycycline and also was treated for shingles. Most of these ulcerations had healed primarily but the one on her left lower extremity lateral part is open and has continued to drain for about a month. She has minimal pain over this area. She has been using some local ointment and a dressing has been put over this. Last year she had a full vascular workup at [facility] in [place] and saw Dr. Donnetta Hutching for an opinion regarding arterial insufficiency. though she had significant arterial insufficiency a major surgical procedure was not recommended because of her age. Conservative therapy was recommended. The patient has had some vascular vein surgery done about 6 years ago in this hospital. 03/26/2015 -- she now has several other issues going with a cardiac problem and has been quite worried about this. Her leg is doing fine otherwise. 04/02/2015 -- all her chest and abdominal pain turned out to be just chronic constipation and a cardiac workup was fine. She is much relieved about the news. Electronic Signature(s) Signed: 04/09/2015 5:41:46 PM By: Christin Fudge MD, FACS Entered By: Christin Fudge on 04/09/2015 14:13:07 [name] ([id number]) -------------------------------------------------------------------------------- Physical Exam Details Patient Name: [name] Date of Service: 04/09/2015 1:15 PM Medical Record Number: [id number] Patient Account Number:  [id number] Date of Birth/Sex: Aug 27, 1922 (81 y.o. Female) Treating RN: Aileen Fass Primary Care Physician: Pernell Dupre Other Clinician: Referring Physician: Pernell Dupre Treating Physician/Extender: Frann Rider in Treatment: 8 Constitutional .  Pulse regular. Respirations normal and unlabored. Afebrile. . Eyes Nonicteric. Reactive to light. Ears, Nose, Mouth, and Throat Lips, teeth, and gums WNL.[name] Moist mucosa without lesions . Neck supple and nontender. No palpable supraclavicular or cervical adenopathy. Normal sized without goiter. Respiratory WNL. No retractions.. Cardiovascular Pedal Pulses WNL. No clubbing, cyanosis or edema. Integumentary (Hair, Skin) Her left lower extremity is looking good with most of the hyper granulation tissue gone down and that is good epithelization.. No crepitus or fluctuance. No peri-wound warmth or erythema. No masses.[name] Psychiatric Judgement and insight Intact.. No evidence of depression, anxiety, or agitation.. Electronic Signature(s) Signed: 04/09/2015 5:41:46 PM By: Christin Fudge MD, FACS Entered By: Christin Fudge on 04/09/2015 14:14:17 [name] ([id number]) -------------------------------------------------------------------------------- Physician Orders Details Patient Name: [name] Date of Service: 04/09/2015 1:15 PM Medical Record Number: [id number] Patient Account Number: [id number] Date of Birth/Sex: 02-Jan-1922 (81 y.o. Female) Treating RN: Montey Hora Primary Care Physician: Pernell Dupre Other Clinician: Referring Physician: Pernell Dupre Treating Physician/Extender: Frann Rider in Treatment: 8 Verbal / Phone Orders: Yes Clinician: Montey Hora Read Back and Verified: Yes Diagnosis Coding Wound Cleansing Wound #3 Left,Posterior Lower Leg o Clean wound with Normal Saline. o Cleanse wound with mild soap and water o May Shower, gently pat wound dry prior to applying  new dressing. o May shower with protection. Wound #4 Right,Posterior Lower Leg o Clean wound with Normal Saline. o Cleanse wound with mild soap and water o May Shower, gently pat wound dry prior to applying new dressing. o May shower with protection. Anesthetic Wound #3 Left,Posterior Lower Leg o Topical Lidocaine 4% cream applied to wound bed prior to debridement Wound #4 Right,Posterior Lower Leg o Topical Lidocaine 4% cream applied to wound bed prior to debridement Primary Wound Dressing Wound #3 Left,Posterior Lower Leg o Contact layer Wound #4 Right,Posterior Lower Leg o Contact layer Secondary Dressing Wound #3 Left,Posterior Lower Leg o Dry Gauze - bolster with gauze o Boardered Foam Dressing Wound #4 Right,Posterior Lower Leg o Dry Gauze - bolster with gauze o Boardered Foam Dressing [name], [name] ([id number]) Dressing Change Frequency Wound #3 Left,Posterior Lower Leg o Change dressing every week Follow-up Appointments Wound #3 Left,Posterior Lower Leg o Return Appointment in 1 week. Wound #4 Right,Posterior Lower Leg o Return Appointment in 1 week. Electronic Signature(s) Signed: 04/09/2015 5:41:46 PM By: Christin Fudge MD, FACS Signed: 04/10/2015 3:12:27 PM By: Montey Hora Entered By: Montey Hora on 04/09/2015 14:02:30 [name] ([id number]) -------------------------------------------------------------------------------- Problem List Details Patient Name: LAYLIANA, DEVINS Date of Service: 04/09/2015 1:15 PM Medical Record Number: [id number] Patient Account Number: [id number] Date of Birth/Sex: [birth year] (81 y.o. Female) Treating RN: Aileen Fass Primary Care Physician: Pernell Dupre Other Clinician: Referring Physician: Pernell Dupre Treating Physician/Extender: Frann Rider in Treatment: 8 Active Problems ICD-10 Encounter Code Description Active Date Diagnosis L97.222 Non-pressure  chronic ulcer of left calf with fat layer 02/06/2015 Yes exposed I70.248 Atherosclerosis of native arteries of left leg with ulceration 02/06/2015 Yes of other part of lower left leg S81.811A Laceration without foreign body, right lower leg, initial 04/09/2015 Yes encounter Inactive Problems Resolved Problems ICD-10 Code Description Active Date Resolved Date L02.426 Furuncle of left lower limb 02/06/2015 02/06/2015 L02.425 Furuncle of right lower limb 02/06/2015 02/06/2015 Electronic Signature(s) Signed: 04/09/2015 5:41:46 PM By: Christin Fudge MD, FACS Entered By: Christin Fudge on 04/09/2015 14:12:21 [name] ([id number]) -------------------------------------------------------------------------------- Progress Note Details Patient Name: [name] Date of Service: 04/09/2015 1:15 PM Medical Record Number: [id number]  Patient Account Number: [id number] Date of Birth/Sex: 02-18-1922 (81 y.o. Female) Treating RN: Aileen Fass Primary Care Physician: Pernell Dupre Other Clinician: Referring Physician: Pernell Dupre Treating Physician/Extender: Frann Rider in Treatment: 8 Subjective Chief Complaint Information obtained from Patient Patient returns to the wound care center for new ulcer to: left lower extremity lateral part of the calf. This has been there for about a month. About 4 days ago she injured her right calf area when she was applying some ointment and she may have scratched herself with a nail. History of Present Illness (HPI) The patient is a pleasant 81 year old with a past medical history significant for aortic valve replacement (on Coumadin), pacemaker, CHF, abdominal aortic aneurysm, and CVA. No history of diabetes or peripheral vascular disease. She underwent excision of a squamous cell cancer on her right upper arm and left dorsal foot in early August 2015 at the [facility]. Shave biopsy on May 11, 2014 showed  "superficially invasive squamous cell carcinoma". She subsequently underwent wide excision with clear margins. She presented to the wound clinic with full thickness open surgical wounds. No symptoms to suggest ischemic rest pain or claudication. ABI 1.1. Cultures of L foot on August 09, 2014 grew ORSA sensitive to tigecycline. She has completed a course of doxycycline. 02/06/2015. -- This time around she had multiple furuncles over her left lower extremity and some on her right too. Was treated by her primary care physician with doxycycline and also was treated for shingles. Most of these ulcerations had healed primarily but the one on her left lower extremity lateral part is open and has continued to drain for about a month. She has minimal pain over this area. She has been using some local ointment and a dressing has been put over this. Last year she had a full vascular workup at [facility] in [place] and saw Dr. Donnetta Hutching for an opinion regarding arterial insufficiency. though she had significant arterial insufficiency a major surgical procedure was not recommended because of her age. Conservative therapy was recommended. The patient has had some vascular vein surgery done about 6 years ago in this hospital. 03/26/2015 -- she now has several other issues going with a cardiac problem and has been quite worried about this. Her leg is doing fine otherwise. 04/02/2015 -- all her chest and abdominal pain turned out to be just chronic constipation and a cardiac workup was fine. She is much relieved about the news. [name], [name] ([id number]) Objective Constitutional Pulse regular. Respirations normal and unlabored. Afebrile. Vitals Time Taken: 1:35 PM, Height: 63 in, Weight: 148 lbs, BMI: 26.2, Temperature: 97.4 F, Pulse: 76 bpm, Respiratory Rate: 16 breaths/min, Blood Pressure: 136/69 mmHg. Eyes Nonicteric. Reactive to light. Ears, Nose, Mouth, and Throat Lips, teeth, and gums WNL.[name]  Moist mucosa without lesions . Neck supple and nontender. No palpable supraclavicular or cervical adenopathy. Normal sized without goiter. Respiratory WNL. No retractions.. Cardiovascular Pedal Pulses WNL. No clubbing, cyanosis or edema. Psychiatric Judgement and insight Intact.. No evidence of depression, anxiety, or agitation.. Integumentary (Hair, Skin) Her left lower extremity is looking good with most of the hyper granulation tissue gone down and that is good epithelization.. No crepitus or fluctuance. No peri-wound warmth or erythema. No masses.. Wound #3 status is Open. Original cause of wound was Gradually Appeared. The wound is located on the Left,Posterior Lower Leg. The wound measures 0.7cm length x 0.5cm width x 0.1cm depth; 0.275cm^2 area and 0.027cm^3 volume. The wound is limited to skin  breakdown. There is no tunneling or undermining noted. There is a small amount of serosanguineous drainage noted. The wound margin is distinct with the outline attached to the wound base. There is large (67-100%) red granulation within the wound bed. There is no necrotic tissue within the wound bed. The periwound skin appearance did not exhibit: Callus, Crepitus, Excoriation, Fluctuance, Friable, Induration, Localized Edema, Rash, Scarring, Dry/Scaly, Maceration, Moist, Atrophie Blanche, Cyanosis, Ecchymosis, Hemosiderin Staining, Mottled, Pallor, Rubor, Erythema. Periwound temperature was noted as No Abnormality. Wound #4 status is Open. Original cause of wound was Trauma. The wound is located on the Right,Posterior Lower Leg. The wound measures 1cm length x 0.8cm width x 0.1cm depth; 0.628cm^2 area and 0.063cm^3 volume. The wound is limited to skin breakdown. There is a small amount of serosanguineous drainage noted. The wound margin is distinct with the outline attached to the wound base. [name], [name] ([id number]) There is large (67-100%) red granulation within the wound bed. There is  no necrotic tissue within the wound bed. The periwound skin appearance had no abnormalities noted for texture. The periwound skin appearance had no abnormalities noted for moisture. The periwound skin appearance had no abnormalities noted for color. Periwound temperature was noted as No Abnormality. Her left lower extremity is looking good with most of the hyper granulation tissue gone down and that is good epithelization. The right lower extremity where she has a recent laceration has a flap of skin which is removed and this is still laying in tachycardia and hence I used it to cover the wound. Assessment Active Problems ICD-10 L97.222 - Non-pressure chronic ulcer of left calf with fat layer exposed I70.248 - Atherosclerosis of native arteries of left leg with ulceration of other part of lower left leg S81.811A - Laceration without foreign body, right lower leg, initial encounter Procedures We will use Mepitel over the right leg laceration and cover it with a bordered foam so as to let the skin heal in place. The left lower extremity has almost completely epihelialized and the hyper granulation tissues gone down. we will also use a bordered foam and Mepitel on this wound and see her back next week. Plan Wound Cleansing: Wound #3 Left,Posterior Lower Leg: Clean wound with Normal Saline. Cleanse wound with mild soap and water May Shower, gently pat wound dry prior to applying new dressing. May shower with protection. Wound #4 Right,Posterior Lower Leg: Clean wound with Normal Saline. Cleanse wound with mild soap and water [name], [name] ([id number]) May Shower, gently pat wound dry prior to applying new dressing. May shower with protection. Anesthetic: Wound #3 Left,Posterior Lower Leg: Topical Lidocaine 4% cream applied to wound bed prior to debridement Wound #4 Right,Posterior Lower Leg: Topical Lidocaine 4% cream applied to wound bed prior to debridement Primary Wound  Dressing: Wound #3 Left,Posterior Lower Leg: Contact layer Wound #4 Right,Posterior Lower Leg: Contact layer Secondary Dressing: Wound #3 Left,Posterior Lower Leg: Dry Gauze - bolster with gauze Boardered Foam Dressing Wound #4 Right,Posterior Lower Leg: Dry Gauze - bolster with gauze Boardered Foam Dressing Dressing Change Frequency: Wound #3 Left,Posterior Lower Leg: Change dressing every week Follow-up Appointments: Wound #3 Left,Posterior Lower Leg: Return Appointment in 1 week. Wound #4 Right,Posterior Lower Leg: Return Appointment in 1 week. We will use Mepitel over the right leg laceration and cover it with a bordered foam so as to let the skin heal in place. The left lower extremity has almost completely epihelialized and the hyper granulation tissues gone down. we will also  use a bordered foam and Mepitel on this wound and see her back next week. Electronic Signature(s) Signed: 04/09/2015 5:41:46 PM By: Christin Fudge MD, FACS Entered By: Christin Fudge on 04/09/2015 14:17:05 [name] ([id number]) -------------------------------------------------------------------------------- SuperBill Details Patient Name: [name] Date of Service: 04/09/2015 Medical Record Number: [id number] Patient Account Number: [id number] Date of Birth/Sex: 02-14-1922 (81 y.o. Female) Treating RN: Aileen Fass Primary Care Physician: Pernell Dupre Other Clinician: Referring Physician: Pernell Dupre Treating Physician/Extender: Frann Rider in Treatment: 8 Diagnosis Coding ICD-10 Codes Code Description [phone] Non-pressure chronic ulcer of left calf with fat layer exposed I70.248 Atherosclerosis of native arteries of left leg with ulceration of other part of lower left leg S81.811A Laceration without foreign body, right lower leg, initial encounter Facility Procedures CPT4 Code: [id number] Description: 99213 - WOUND CARE VISIT-LEV 3 EST  PT Modifier: Quantity: 1 Physician Procedures CPT4 Code Description: [id number] 99213 - WC PHYS LEVEL 3 - EST PT ICD-10 Description Diagnosis L97.222 Non-pressure chronic ulcer of left calf with fat laye S81.811A Laceration without foreign body, right lower leg, ini Modifier: 25 r exposed tial encounter Quantity: 1 Electronic Signature(s) Signed: 04/09/2015 5:41:46 PM By: Christin Fudge MD, FACS Entered By: Christin Fudge on 04/09/2015 14:17:24

## 2015-04-10 NOTE — Progress Notes (Signed)
[name], [name] ([id number]) Visit Report for 04/09/2015 Arrival Information Details Patient Name: [name], [name] Date of Service: 04/09/2015 1:15 PM Medical Record Number: [id number] Patient Account Number: [id number] Date of Birth/Sex: 10/31/1922 (81 y.o. Female) Treating RN: Aileen Fass Primary Care Physician: Pernell Dupre Other Clinician: Referring Physician: Pernell Dupre Treating Physician/Extender: Frann Rider in Treatment: 8 Visit Information History Since Last Visit Added or deleted any medications: No Patient Arrived: Wheel Chair Any new allergies or adverse reactions: No Arrival Time: 13:30 Had a fall or experienced change in No Accompanied By: family activities of daily living that may affect Transfer Assistance: None risk of falls: Patient Identification Verified: Yes Signs or symptoms of abuse/neglect since last No Secondary Verification Yes visito Process Completed: Hospitalized since last visit: No Patient Requires No Has Dressing in Place as Prescribed: Yes Transmission-Based Pain Present Now: No Precautions: Patient Has Alerts: Yes Patient Alerts: Patient on Blood Thinner ABI,L:1.10,R:1.10 Electronic Signature(s) Signed: 04/09/2015 2:50:36 PM By: Aileen Fass Entered By: Aileen Fass on 04/09/2015 13:35:25 [name] ([id number]) -------------------------------------------------------------------------------- Clinic Level of Care Assessment Details Patient Name: [name] Date of Service: 04/09/2015 1:15 PM Medical Record Number: [id number] Patient Account Number: [id number] Date of Birth/Sex: 1922-03-16 (81 y.o. Female) Treating RN: Montey Hora Primary Care Physician: Pernell Dupre Other Clinician: Referring Physician: Pernell Dupre Treating Physician/Extender: Frann Rider in Treatment: 8 Clinic Level of Care Assessment Items TOOL 4 Quantity Score []  - Use when only an EandM is  performed on FOLLOW-UP visit 0 ASSESSMENTS - Nursing Assessment / Reassessment X - Reassessment of Co-morbidities (includes updates in patient status) 1 10 X - Reassessment of Adherence to Treatment Plan 1 5 ASSESSMENTS - Wound and Skin Assessment / Reassessment []  - Simple Wound Assessment / Reassessment - one wound 0 X - Complex Wound Assessment / Reassessment - multiple wounds 2 5 []  - Dermatologic / Skin Assessment (not related to wound area) 0 ASSESSMENTS - Focused Assessment X - Circumferential Edema Measurements - multi extremities 2 5 []  - Nutritional Assessment / Counseling / Intervention 0 X - Lower Extremity Assessment (monofilament, tuning fork, pulses) 1 5 []  - Peripheral Arterial Disease Assessment (using hand held doppler) 0 ASSESSMENTS - Ostomy and/or Continence Assessment and Care []  - Incontinence Assessment and Management 0 []  - Ostomy Care Assessment and Management (repouching, etc.) 0 PROCESS - Coordination of Care X - Simple Patient / Family Education for ongoing care 1 15 []  - Complex (extensive) Patient / Family Education for ongoing care 0 []  - Staff obtains Programmer, systems, Records, Test Results / Process Orders 0 []  - Staff telephones HHA, Nursing Homes / Clarify orders / etc 0 []  - Routine Transfer to another Facility (non-emergent condition) 0 [name], [name] ([id number]) []  - Routine Hospital Admission (non-emergent condition) 0 []  - New Admissions / Biomedical engineer / Ordering NPWT, Apligraf, etc. 0 []  - Emergency Hospital Admission (emergent condition) 0 X - Simple Discharge Coordination 1 10 []  - Complex (extensive) Discharge Coordination 0 PROCESS - Special Needs []  - Pediatric / Minor Patient Management 0 []  - Isolation Patient Management 0 []  - Hearing / Language / Visual special needs 0 []  - Assessment of Community assistance (transportation, D/C planning, etc.) 0 []  - Additional assistance / Altered mentation 0 []  - Support Surface(s)  Assessment (bed, cushion, seat, etc.) 0 INTERVENTIONS - Wound Cleansing / Measurement []  - Simple Wound Cleansing - one wound 0 X - Complex Wound Cleansing - multiple wounds 2 5 X - Wound Imaging (photographs -  any number of wounds) 1 5 []  - Wound Tracing (instead of photographs) 0 []  - Simple Wound Measurement - one wound 0 X - Complex Wound Measurement - multiple wounds 2 5 INTERVENTIONS - Wound Dressings X - Small Wound Dressing one or multiple wounds 2 10 []  - Medium Wound Dressing one or multiple wounds 0 []  - Large Wound Dressing one or multiple wounds 0 []  - Application of Medications - topical 0 []  - Application of Medications - injection 0 INTERVENTIONS - Miscellaneous []  - External ear exam 0 [name], [name] ([id number]) []  - Specimen Collection (cultures, biopsies, blood, body fluids, etc.) 0 []  - Specimen(s) / Culture(s) sent or taken to Lab for analysis 0 []  - Patient Transfer (multiple staff / Harrel Lemon Lift / Similar devices) 0 []  - Simple Staple / Suture removal (25 or less) 0 []  - Complex Staple / Suture removal (26 or more) 0 []  - Hypo / Hyperglycemic Management (close monitor of Blood Glucose) 0 []  - Ankle / Brachial Index (ABI) - do not check if billed separately 0 X - Vital Signs 1 5 Has the patient been seen at the hospital within the last three years: Yes Total Score: 115 Level Of Care: New/Established - Level 3 Electronic Signature(s) Signed: 04/10/2015 3:12:27 PM By: Montey Hora Entered By: Montey Hora on 04/09/2015 14:03:23 [name] ([id number]) -------------------------------------------------------------------------------- Encounter Discharge Information Details Patient Name: [name] Date of Service: 04/09/2015 1:15 PM Medical Record Number: [id number] Patient Account Number: [id number] Date of Birth/Sex: 1922/08/08 (81 y.o. Female) Treating RN: Aileen Fass Primary Care Physician: Pernell Dupre Other Clinician: Referring  Physician: Pernell Dupre Treating Physician/Extender: Frann Rider in Treatment: 8 Encounter Discharge Information Items Discharge Pain Level: 0 Discharge Condition: Stable Ambulatory Status: Wheelchair Discharge Destination: Home Transportation: Private Auto Accompanied By: family Schedule Follow-up Appointment: Yes Medication Reconciliation completed and provided to Patient/Care No Barbar Brede: Provided on Clinical Summary of Care: 04/09/2015 Form Type Recipient Paper Patient JW Electronic Signature(s) Signed: 04/09/2015 2:09:49 PM By: Ruthine Dose Entered By: Ruthine Dose on 04/09/2015 14:09:49 [name] ([id number]) -------------------------------------------------------------------------------- Lower Extremity Assessment Details Patient Name: [name] Date of Service: 04/09/2015 1:15 PM Medical Record Number: [id number] Patient Account Number: [id number] Date of Birth/Sex: 29-Sep-1922 (81 y.o. Female) Treating RN: Aileen Fass Primary Care Physician: Pernell Dupre Other Clinician: Referring Physician: Pernell Dupre Treating Physician/Extender: Frann Rider in Treatment: 8 Edema Assessment Assessed: [Left: No] [Right: No] E[Left: dema] [Right: :] Calf Left: Right: Point of Measurement: 34 cm From Medial Instep 36.4 cm 36 cm Ankle Left: Right: Point of Measurement: 12 cm From Medial Instep 20.8 cm 21 cm Vascular Assessment Claudication: Claudication Assessment [Left:None] [Right:None] Pulses: Posterior Tibial Dorsalis Pedis Palpable: [Left:Yes] [Right:Yes] Extremity colors, hair growth, and conditions: Extremity Color: [Left:Hyperpigmented] [Right:Hyperpigmented] Hair Growth on Extremity: [Left:Yes] [Right:Yes] Temperature of Extremity: [Left:Warm] [Right:Warm] Capillary Refill: [Left:< 3 seconds] [Right:< 3 seconds] Lipodermatosclerosis: [Left:No] [Right:No] Toe Nail Assessment Left: Right: Thick: Yes  Yes Discolored: Yes Yes Deformed: No No Improper Length and Hygiene: Yes Yes Electronic Signature(s) Signed: 04/09/2015 2:50:36 PM By: Pilar Jarvis, [name] ([id number]) Entered By: Aileen Fass on 04/09/2015 13:44:01 [name] ([id number]) -------------------------------------------------------------------------------- Multi Wound Chart Details Patient Name: [name] Date of Service: 04/09/2015 1:15 PM Medical Record Number: [id number] Patient Account Number: [id number] Date of Birth/Sex: 05/24/1922 (81 y.o. Female) Treating RN: Montey Hora Primary Care Physician: Pernell Dupre Other Clinician: Referring Physician: Pernell Dupre Treating Physician/Extender: Frann Rider in Treatment: 8 Vital Signs  Height(in): 63 Pulse(bpm): 76 Weight(lbs): 148 Blood Pressure 136/69 (mmHg): Body Mass Index(BMI): 26 Temperature(F): 97.4 Respiratory Rate 16 (breaths/min): Photos: [3:No Photos] [4:No Photos] [N/A:N/A] Wound Location: [3:Left Lower Leg - Posterior Right Lower Leg -] [4:Posterior] [N/A:N/A] Wounding Event: [3:Gradually Appeared] [4:Trauma] [N/A:N/A] Primary Etiology: [3:Infection - not elsewhere Trauma, Other classified] [N/A:N/A] Comorbid History: [3:Cataracts, Angina, Arrhythmia, Congestive Arrhythmia, Congestive Heart Failure, Hypertension, Hepatitis B, Hypertension, Hepatitis B, Osteoarthritis, Seizure Disorder] [4:Cataracts, Angina, Heart Failure, Osteoarthritis, Seizure  Disorder] [N/A:N/A] Date Acquired: [3:01/08/2015] [4:04/05/2015] [N/A:N/A] Weeks of Treatment: [3:8] [4:0] [N/A:N/A] Wound Status: [3:Open] [4:Open] [N/A:N/A] Measurements L x W x D 0.7x0.5x0.1 [4:1x0.8x0.1] [N/A:N/A] (cm) Area (cm) : [3:0.275] [4:0.628] [N/A:N/A] Volume (cm) : [3:0.027] [4:0.063] [N/A:N/A] % Reduction in Area: [3:93.00%] [4:0.00%] [N/A:N/A] % Reduction in Volume: 96.60% [4:0.00%] [N/A:N/A] Classification: [3:Full Thickness Without  Exposed Support Structures] [4:Partial Thickness] [N/A:N/A] Exudate Amount: [3:Small] [4:Small] [N/A:N/A] Exudate Type: [3:Serosanguineous] [4:Serosanguineous] [N/A:N/A] Exudate Color: [3:red, brown] [4:red, brown] [N/A:N/A] Wound Margin: [3:Distinct, outline attached Distinct, outline attached N/A] Granulation Amount: [3:Large (67-100%)] [4:Large (67-100%)] [N/A:N/A] Granulation Quality: [3:Red] [4:Red] [N/A:N/A] Necrotic Amount: None Present (0%) None Present (0%) N/A Exposed Structures: Fascia: No Fascia: No N/A Fat: No Fat: No Tendon: No Tendon: No Muscle: No Muscle: No Joint: No Joint: No Bone: No Bone: No Limited to Skin Limited to Skin Breakdown Breakdown Epithelialization: Small (1-33%) N/A N/A Periwound Skin Texture: Edema: No No Abnormalities Noted N/A Excoriation: No Induration: No Callus: No Crepitus: No Fluctuance: No Friable: No Rash: No Scarring: No Periwound Skin Maceration: No No Abnormalities Noted N/A Moisture: Moist: No Dry/Scaly: No Periwound Skin Color: Atrophie Blanche: No No Abnormalities Noted N/A Cyanosis: No Ecchymosis: No Erythema: No Hemosiderin Staining: No Mottled: No Pallor: No Rubor: No Temperature: No Abnormality No Abnormality N/A Tenderness on No No N/A Palpation: Wound Preparation: Ulcer Cleansing: Ulcer Cleansing: N/A Rinsed/Irrigated with Rinsed/Irrigated with Saline Saline Topical Anesthetic Topical Anesthetic Applied: Other: lidocaine Applied: Other: 4% 4% Lidocaine Treatment Notes Electronic Signature(s) Signed: 04/10/2015 3:12:27 PM By: Montey Hora Entered By: Montey Hora on 04/09/2015 13:59:46 [name] ([id number]) -------------------------------------------------------------------------------- [place] Details Patient Name: [name] Date of Service: 04/09/2015 1:15 PM Medical Record Number: [id number] Patient Account Number: [id number] Date of Birth/Sex: 04-10-1922  (81 y.o. Female) Treating RN: Montey Hora Primary Care Physician: Pernell Dupre Other Clinician: Referring Physician: Pernell Dupre Treating Physician/Extender: Frann Rider in Treatment: 8 Active Inactive Abuse / Safety / Falls / Self Care Management Nursing Diagnoses: Impaired physical mobility Potential for falls Goals: Patient will remain injury free Date Initiated: 02/06/2015 Goal Status: Active Patient/caregiver will verbalize understanding of skin care regimen Date Initiated: 02/06/2015 Goal Status: Active Patient/caregiver will verbalize/demonstrate measures taken to prevent injury and/or falls Date Initiated: 02/06/2015 Goal Status: Active Interventions: Provide education on fall prevention Provide education on safe transfers Notes: Orientation to the Wound Care Program Nursing Diagnoses: Knowledge deficit related to the wound healing center program Goals: Patient/caregiver will verbalize understanding of the [place] Program Date Initiated: 02/06/2015 Goal Status: Active Interventions: Provide education on orientation to the wound center Notes: [name], [name] ([id number]) Wound/Skin Impairment Nursing Diagnoses: Impaired tissue integrity Knowledge deficit related to ulceration/compromised skin integrity Goals: Patient/caregiver will verbalize understanding of skin care regimen Date Initiated: 02/06/2015 Goal Status: Active Ulcer/skin breakdown will heal within 14 weeks Date Initiated: 02/06/2015 Goal Status: Active Interventions: Assess patient/caregiver ability to obtain necessary supplies Assess patient/caregiver ability to perform ulcer/skin care regimen upon admission and as needed Assess ulceration(s) every visit Provide education  on ulcer and skin care Treatment Activities: Skin care regimen initiated : 04/09/2015 Topical wound management initiated : 04/09/2015 Notes: Electronic Signature(s) Signed: 04/10/2015 3:12:27  PM By: Montey Hora Entered By: Montey Hora on 04/09/2015 13:59:14 [name] ([id number]) -------------------------------------------------------------------------------- Pain Assessment Details Patient Name: [name] Date of Service: 04/09/2015 1:15 PM Medical Record Number: [id number] Patient Account Number: [id number] Date of Birth/Sex: [birth year] (81 y.o. Female) Treating RN: Aileen Fass Primary Care Physician: Pernell Dupre Other Clinician: Referring Physician: Pernell Dupre Treating Physician/Extender: Frann Rider in Treatment: 8 Active Problems Location of Pain Severity and Description of Pain Patient Has Paino No Site Locations Pain Management and Medication Current Pain Management: Electronic Signature(s) Signed: 04/09/2015 2:50:36 PM By: Aileen Fass Entered By: Aileen Fass on 04/09/2015 13:35:34 [name] ([id number]) -------------------------------------------------------------------------------- Patient/Caregiver Education Details Patient Name: [name] Date of Service: 04/09/2015 1:15 PM Medical Record Number: [id number] Patient Account Number: [id number] Date of Birth/Gender: 1921/11/27 (81 y.o. Female) Treating RN: Montey Hora Primary Care Physician: Pernell Dupre Other Clinician: Referring Physician: Pernell Dupre Treating Physician/Extender: Frann Rider in Treatment: 8 Education Assessment Education Provided To: Patient and Caregiver Education Topics Provided Wound/Skin Impairment: Handouts: Other: wound care as ordered Methods: Demonstration, Explain/Verbal Responses: State content correctly Electronic Signature(s) Signed: 04/09/2015 2:50:36 PM By: Aileen Fass Entered By: Aileen Fass on 04/09/2015 14:08:36 [name] ([id number]) -------------------------------------------------------------------------------- Wound Assessment Details Patient Name:  [name] Date of Service: 04/09/2015 1:15 PM Medical Record Number: [id number] Patient Account Number: [id number] Date of Birth/Sex: 03/09/1922 (81 y.o. Female) Treating RN: Aileen Fass Primary Care Physician: Pernell Dupre Other Clinician: Referring Physician: Pernell Dupre Treating Physician/Extender: Frann Rider in Treatment: 8 Wound Status Wound Number: 3 Primary Infection - not elsewhere classified Etiology: Wound Location: Left Lower Leg - Posterior Wound Open Wounding Event: Gradually Appeared Status: Date Acquired: 01/08/2015 Comorbid Cataracts, Angina, Arrhythmia, Weeks Of Treatment: 8 History: Congestive Heart Failure, Hypertension, Clustered Wound: No Hepatitis B, Osteoarthritis, Seizure Disorder Photos Photo Uploaded By: Junious Dresser on 04/09/2015 17:27:21 Wound Measurements Length: (cm) 0.7 Width: (cm) 0.5 Depth: (cm) 0.1 Area: (cm) 0.275 Volume: (cm) 0.027 % Reduction in Area: 93% % Reduction in Volume: 96.6% Epithelialization: Small (1-33%) Tunneling: No Undermining: No Wound Description Full Thickness Without Exposed Foul Odor A Classification: Support Structures Wound Margin: Distinct, outline attached Exudate Small Amount: Exudate Type: Serosanguineous Exudate Color: red, brown fter Cleansing: No Wound Bed Granulation Amount: Large (67-100%) Exposed Structure [name], [name] ([id number]) Granulation Quality: Red Fascia Exposed: No Necrotic Amount: None Present (0%) Fat Layer Exposed: No Tendon Exposed: No Muscle Exposed: No Joint Exposed: No Bone Exposed: No Limited to Skin Breakdown Periwound Skin Texture Texture Color No Abnormalities Noted: No No Abnormalities Noted: No Callus: No Atrophie Blanche: No Crepitus: No Cyanosis: No Excoriation: No Ecchymosis: No Fluctuance: No Erythema: No Friable: No Hemosiderin Staining: No Induration: No Mottled: No Localized Edema: No Pallor: No Rash:  No Rubor: No Scarring: No Temperature / Pain Moisture Temperature: No Abnormality No Abnormalities Noted: No Dry / Scaly: No Maceration: No Moist: No Wound Preparation Ulcer Cleansing: Rinsed/Irrigated with Saline Topical Anesthetic Applied: Other: lidocaine 4%, Treatment Notes Wound #3 (Left, Posterior Lower Leg) 1. Cleansed with: Clean wound with Normal Saline 2. Anesthetic Topical Lidocaine 4% cream to wound bed prior to debridement 3. Peri-wound Care: Skin Prep 5. Secondary Dressing Applied Bordered Foam Dressing Electronic Signature(s) Signed: 04/09/2015 2:50:36 PM By: Aileen Fass Entered By: Aileen Fass on 04/09/2015 13:45:38 [name] ([id number]) --------------------------------------------------------------------------------  Wound Assessment Details Patient Name: [name], [name]. Date of Service: 04/09/2015 1:15 PM Medical Record Number: [id number] Patient Account Number: [id number] Date of Birth/Sex: 1922-11-17 (81 y.o. Female) Treating RN: Aileen Fass Primary Care Physician: Pernell Dupre Other Clinician: Referring Physician: Pernell Dupre Treating Physician/Extender: Frann Rider in Treatment: 8 Wound Status Wound Number: 4 Primary Trauma, Other Etiology: Wound Location: Right Lower Leg - Posterior Wound Open Wounding Event: Trauma Status: Date Acquired: 04/05/2015 Comorbid Cataracts, Angina, Arrhythmia, Weeks Of Treatment: 0 History: Congestive Heart Failure, Hypertension, Clustered Wound: No Hepatitis B, Osteoarthritis, Seizure Disorder Photos Photo Uploaded By: Junious Dresser on 04/09/2015 17:27:29 Wound Measurements Length: (cm) 1 Width: (cm) 0.8 Depth: (cm) 0.1 Area: (cm) 0.628 Volume: (cm) 0.063 % Reduction in Area: 0% % Reduction in Volume: 0% Wound Description Classification: Partial Thickness Wound Margin: Distinct, outline attached Exudate Amount: Small Exudate Type: Serosanguineous Exudate  Color: red, brown Foul Odor After Cleansing: No Wound Bed Granulation Amount: Large (67-100%) Exposed Structure Granulation Quality: Red Fascia Exposed: No Necrotic Amount: None Present (0%) Fat Layer Exposed: No [name], [name]. ([id number]) Tendon Exposed: No Muscle Exposed: No Joint Exposed: No Bone Exposed: No Limited to Skin Breakdown Periwound Skin Texture Texture Color No Abnormalities Noted: Yes No Abnormalities Noted: Yes Moisture Temperature / Pain No Abnormalities Noted: Yes Temperature: No Abnormality Wound Preparation Ulcer Cleansing: Rinsed/Irrigated with Saline Topical Anesthetic Applied: Other: 4% Lidocaine, Treatment Notes Wound #4 (Right, Posterior Lower Leg) 1. Cleansed with: Clean wound with Normal Saline 2. Anesthetic Topical Lidocaine 4% cream to wound bed prior to debridement 3. Peri-wound Care: Skin Prep 5. Secondary Dressing Applied Bordered Foam Dressing Electronic Signature(s) Signed: 04/09/2015 2:50:36 PM By: Aileen Fass Entered By: Aileen Fass on 04/09/2015 13:49:19 [name] ([id number]) -------------------------------------------------------------------------------- [place] Details Patient Name: [name] Date of Service: 04/09/2015 1:15 PM Medical Record Number: [id number] Patient Account Number: [id number] Date of Birth/Sex: Sep 22, 1922 (81 y.o. Female) Treating RN: Aileen Fass Primary Care Physician: Pernell Dupre Other Clinician: Referring Physician: Pernell Dupre Treating Physician/Extender: Frann Rider in Treatment: 8 Vital Signs Time Taken: 13:35 Temperature (F): 97.4 Height (in): 63 Pulse (bpm): 76 Weight (lbs): 148 Respiratory Rate (breaths/min): 16 Body Mass Index (BMI): 26.2 Blood Pressure (mmHg): 136/69 Reference Range: 80 - 120 mg / dl Electronic Signature(s) Signed: 04/09/2015 2:50:36 PM By: Aileen Fass Entered By: Aileen Fass on 04/09/2015 13:36:08

## 2015-04-18 ENCOUNTER — Encounter: Payer: Self-pay | Admitting: Internal Medicine

## 2015-04-18 ENCOUNTER — Ambulatory Visit (INDEPENDENT_AMBULATORY_CARE_PROVIDER_SITE_OTHER): Payer: Medicare Other

## 2015-04-18 DIAGNOSIS — Z5181 Encounter for therapeutic drug level monitoring: Secondary | ICD-10-CM | POA: Diagnosis not present

## 2015-04-18 DIAGNOSIS — I359 Nonrheumatic aortic valve disorder, unspecified: Secondary | ICD-10-CM | POA: Diagnosis not present

## 2015-04-18 DIAGNOSIS — I4891 Unspecified atrial fibrillation: Secondary | ICD-10-CM | POA: Diagnosis not present

## 2015-04-18 DIAGNOSIS — G459 Transient cerebral ischemic attack, unspecified: Secondary | ICD-10-CM | POA: Diagnosis not present

## 2015-04-18 DIAGNOSIS — Z953 Presence of xenogenic heart valve: Secondary | ICD-10-CM

## 2015-04-18 DIAGNOSIS — Z954 Presence of other heart-valve replacement: Secondary | ICD-10-CM

## 2015-04-18 LAB — POCT INR: INR: 3.4

## 2015-04-19 ENCOUNTER — Encounter: Payer: Medicare Other | Attending: Surgery | Admitting: Surgery

## 2015-04-19 DIAGNOSIS — X58XXXA Exposure to other specified factors, initial encounter: Secondary | ICD-10-CM | POA: Diagnosis not present

## 2015-04-19 DIAGNOSIS — I70248 Atherosclerosis of native arteries of left leg with ulceration of other part of lower left leg: Secondary | ICD-10-CM | POA: Diagnosis not present

## 2015-04-19 DIAGNOSIS — Z8673 Personal history of transient ischemic attack (TIA), and cerebral infarction without residual deficits: Secondary | ICD-10-CM | POA: Diagnosis not present

## 2015-04-19 DIAGNOSIS — Z85828 Personal history of other malignant neoplasm of skin: Secondary | ICD-10-CM | POA: Diagnosis not present

## 2015-04-19 DIAGNOSIS — L97222 Non-pressure chronic ulcer of left calf with fat layer exposed: Secondary | ICD-10-CM | POA: Diagnosis present

## 2015-04-19 DIAGNOSIS — S81811A Laceration without foreign body, right lower leg, initial encounter: Secondary | ICD-10-CM | POA: Insufficient documentation

## 2015-04-19 DIAGNOSIS — I509 Heart failure, unspecified: Secondary | ICD-10-CM | POA: Insufficient documentation

## 2015-04-19 DIAGNOSIS — Z950 Presence of cardiac pacemaker: Secondary | ICD-10-CM | POA: Insufficient documentation

## 2015-04-20 NOTE — Progress Notes (Signed)
[name], [name] ([id number]) Visit Report for 04/19/2015 Chief Complaint Document Details Patient Name: [name]. Date of Service: 04/19/2015 1:00 PM Medical Record Number: [id number] Patient Account Number: [id number] Date of Birth/Sex: Oct 03, 1922 (81 y.o. Female) Treating RN: Primary Care Physician: Pernell Dupre Other Clinician: Referring Physician: Pernell Dupre Treating Physician/Extender: Frann Rider in Treatment: 10 Information Obtained from: Patient Chief Complaint Patient returns to the wound care center for new ulcer to: left lower extremity lateral part of the calf. This has been there for about a month. About 4 days ago she injured her right calf area when she was applying some ointment and she may have scratched herself with a nail. Electronic Signature(s) Signed: 04/19/2015 4:40:03 PM By: Christin Fudge MD, FACS Entered By: Christin Fudge on 04/19/2015 13:31:10 [name] ([id number]) -------------------------------------------------------------------------------- HPI Details Patient Name: [name] Date of Service: 04/19/2015 1:00 PM Medical Record Number: [id number] Patient Account Number: [id number] Date of Birth/Sex: Apr 15, 1922 (81 y.o. Female) Treating RN: Primary Care Physician: Pernell Dupre Other Clinician: Referring Physician: Pernell Dupre Treating Physician/Extender: Frann Rider in Treatment: 10 History of Present Illness HPI Description: The patient is a pleasant 81 year old with a past medical history significant for aortic valve replacement (on Coumadin), pacemaker, CHF, abdominal aortic aneurysm, and CVA. No history of diabetes or peripheral vascular disease. She underwent excision of a squamous cell cancer on her right upper arm and left dorsal foot in early August 2015 at the [facility]. Shave biopsy on May 11, 2014 showed "superficially invasive squamous cell carcinoma". She  subsequently underwent wide excision with clear margins. She presented to the wound clinic with full thickness open surgical wounds. No symptoms to suggest ischemic rest pain or claudication. ABI 1.1. Cultures of L foot on August 09, 2014 grew ORSA sensitive to tigecycline. She has completed a course of doxycycline. 02/06/2015. -- This time around she had multiple furuncles over her left lower extremity and some on her right too. Was treated by her primary care physician with doxycycline and also was treated for shingles. Most of these ulcerations had healed primarily but the one on her left lower extremity lateral part is open and has continued to drain for about a month. She has minimal pain over this area. She has been using some local ointment and a dressing has been put over this. Last year she had a full vascular workup at [facility] in [place] and saw Dr. Donnetta Hutching for an opinion regarding arterial insufficiency. though she had significant arterial insufficiency a major surgical procedure was not recommended because of her age. Conservative therapy was recommended. The patient has had some vascular vein surgery done about 6 years ago in this hospital. 03/26/2015 -- she now has several other issues going with a cardiac problem and has been quite worried about this. Her leg is doing fine otherwise. 04/02/2015 -- all her chest and abdominal pain turned out to be just chronic constipation and a cardiac workup was fine. She is much relieved about the news. Electronic Signature(s) Signed: 04/19/2015 4:40:03 PM By: Christin Fudge MD, FACS Entered By: Christin Fudge on 04/19/2015 13:31:19 [name] ([id number]) -------------------------------------------------------------------------------- Physical Exam Details Patient Name: [name] Date of Service: 04/19/2015 1:00 PM Medical Record Number: [id number] Patient Account Number: [id number] Date of Birth/Sex: 05-14-1922 (81 y.o.  Female) Treating RN: Primary Care Physician: Pernell Dupre Other Clinician: Referring Physician: Pernell Dupre Treating Physician/Extender: Frann Rider in Treatment: 10 Constitutional . Pulse regular. Respirations normal and  unlabored. Afebrile. . Eyes Nonicteric. Reactive to light. Ears, Nose, Mouth, and Throat Lips, teeth, and gums WNL.[name] Moist mucosa without lesions . Neck supple and nontender. No palpable supraclavicular or cervical adenopathy. Normal sized without goiter. Respiratory WNL. No retractions.. Cardiovascular Pedal Pulses WNL. No clubbing, cyanosis or edema. Integumentary (Hair, Skin) the original ulcerated area on her left lower eczema T is almost completely healed. The one where she injured her right posterior calf area is got a minimal opening and we will need to address this.. No crepitus or fluctuance. No peri-wound warmth or erythema. No masses.[name] Psychiatric Judgement and insight Intact.. No evidence of depression, anxiety, or agitation.. Electronic Signature(s) Signed: 04/19/2015 4:40:03 PM By: Christin Fudge MD, FACS Entered By: Christin Fudge on 04/19/2015 13:32:13 [name] ([id number]) -------------------------------------------------------------------------------- Physician Orders Details Patient Name: [name] Date of Service: 04/19/2015 1:00 PM Medical Record Number: [id number] Patient Account Number: [id number] Date of Birth/Sex: 1922/10/13 (81 y.o. Female) Treating RN: Montey Hora Primary Care Physician: Pernell Dupre Other Clinician: Referring Physician: Pernell Dupre Treating Physician/Extender: Frann Rider in Treatment: 10 Verbal / Phone Orders: Yes Clinician: Montey Hora Read Back and Verified: Yes Diagnosis Coding Wound Cleansing Wound #3 Left,Posterior Lower Leg o Clean wound with Normal Saline. o Cleanse wound with mild soap and water o May Shower, gently pat wound dry prior to  applying new dressing. o May shower with protection. Wound #4 Right,Posterior Lower Leg o Clean wound with Normal Saline. o Cleanse wound with mild soap and water o May Shower, gently pat wound dry prior to applying new dressing. o May shower with protection. Anesthetic Wound #3 Left,Posterior Lower Leg o Topical Lidocaine 4% cream applied to wound bed prior to debridement Wound #4 Right,Posterior Lower Leg o Topical Lidocaine 4% cream applied to wound bed prior to debridement Primary Wound Dressing Wound #3 Left,Posterior Lower Leg o Contact layer Wound #4 Right,Posterior Lower Leg o Contact layer Secondary Dressing Wound #3 Left,Posterior Lower Leg o Dry Gauze - bolster with gauze o Boardered Foam Dressing Wound #4 Right,Posterior Lower Leg o Dry Gauze - bolster with gauze o Boardered Foam Dressing [name], [name] ([id number]) Dressing Change Frequency Wound #3 Left,Posterior Lower Leg o Change dressing every week Wound #4 Right,Posterior Lower Leg o Change dressing every week Follow-up Appointments Wound #3 Left,Posterior Lower Leg o Return Appointment in 1 week. Wound #4 Right,Posterior Lower Leg o Return Appointment in 1 week. Electronic Signature(s) Signed: 04/19/2015 4:40:03 PM By: Christin Fudge MD, FACS Signed: 04/19/2015 5:10:45 PM By: Montey Hora Entered By: Montey Hora on 04/19/2015 13:29:58 [name] ([id number]) -------------------------------------------------------------------------------- Problem List Details Patient Name: [name], [name] Date of Service: 04/19/2015 1:00 PM Medical Record Number: [id number] Patient Account Number: [id number] Date of Birth/Sex: [birth year] (81 y.o. 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 02/06/2015 Yes exposed I70.248 Atherosclerosis of native arteries of left leg with ulceration 02/06/2015 Yes of other part of lower left leg S81.811A Laceration without foreign body, right lower leg, initial 04/09/2015 Yes encounter Inactive Problems Resolved Problems ICD-10 Code Description Active Date Resolved Date L02.426 Furuncle of left lower limb 02/06/2015 02/06/2015 L02.425 Furuncle of right lower limb 02/06/2015 02/06/2015 Electronic Signature(s) Signed: 04/19/2015 4:40:03 PM By: Christin Fudge MD, FACS Entered By: Christin Fudge on 04/19/2015 13:31:03 [name] ([id number]) --------------------------------------------------------------------------------  Progress Note Details Patient Name: [name], ZISKA. Date of Service: 04/19/2015 1:00 PM Medical Record Number: [id number] Patient Account Number: [id number] Date of Birth/Sex: 08-12-1922 (81 y.o. Female) Treating RN: Primary Care Physician: Pernell Dupre Other Clinician: Referring Physician: Pernell Dupre Treating Physician/Extender: Frann Rider in Treatment: 10 Subjective Chief Complaint Information obtained from Patient Patient returns to the wound care center for new ulcer to: left lower extremity lateral part of the calf. This has been there for about a month. About 4 days ago she injured her right calf area when she was applying some ointment and she may have scratched herself with a nail. History of Present Illness (HPI) The patient is a pleasant 80 year old with a past medical history significant for aortic valve replacement (on Coumadin), pacemaker, CHF, abdominal aortic aneurysm, and CVA. No history of diabetes or peripheral vascular disease. She underwent excision of a squamous cell cancer on her right upper arm and left dorsal foot in early August 2015 at the [facility]. Shave  biopsy on May 11, 2014 showed "superficially invasive squamous cell carcinoma". She subsequently underwent wide excision with clear margins. She presented to the wound clinic with full thickness open surgical wounds. No symptoms to suggest ischemic rest pain or claudication. ABI 1.1. Cultures of L foot on August 09, 2014 grew ORSA sensitive to tigecycline. She has completed a course of doxycycline. 02/06/2015. -- This time around she had multiple furuncles over her left lower extremity and some on her right too. Was treated by her primary care physician with doxycycline and also was treated for shingles. Most of these ulcerations had healed primarily but the one on her left lower extremity lateral part is open and has continued to drain for about a month. She has minimal pain over this area. She has been using some local ointment and a dressing has been put over this. Last year she had a full vascular workup at [facility] in [place] and saw Dr. Donnetta Hutching for an opinion regarding arterial insufficiency. though she had significant arterial insufficiency a major surgical procedure was not recommended because of her age. Conservative therapy was recommended. The patient has had some vascular vein surgery done about 6 years ago in this hospital. 03/26/2015 -- she now has several other issues going with a cardiac problem and has been quite worried about this. Her leg is doing fine otherwise. 04/02/2015 -- all her chest and abdominal pain turned out to be just chronic constipation and a cardiac workup was fine. She is much relieved about the news. [name], [name] ([id number]) Objective Constitutional Pulse regular. Respirations normal and unlabored. Afebrile. Vitals Time Taken: 1:10 PM, Height: 63 in, Weight: 148 lbs, BMI: 26.2, Temperature: 97.5 F, Pulse: 78 bpm, Respiratory Rate: 16 breaths/min, Blood Pressure: 122/78 mmHg. Eyes Nonicteric. Reactive to light. Ears, Nose, Mouth, and  Throat Lips, teeth, and gums WNL.[name] Moist mucosa without lesions . Neck supple and nontender. No palpable supraclavicular or cervical adenopathy. Normal sized without goiter. Respiratory WNL. No retractions.. Cardiovascular Pedal Pulses WNL. No clubbing, cyanosis or edema. Psychiatric Judgement and insight Intact.. No evidence of depression, anxiety, or agitation.. Integumentary (Hair, Skin) the original ulcerated area on her left lower eczema T is almost completely healed. The one where she injured her right posterior calf area is got a minimal opening and we will need to address this.. No crepitus or fluctuance. No peri-wound warmth or erythema. No masses.. Wound #3 status is Open. Original cause of wound was Gradually Appeared.  The wound is located on the Left,Posterior Lower Leg. The wound measures 0.2cm length x 0.2cm width x 0.1cm depth; 0.031cm^2 area and 0.003cm^3 volume. The wound is limited to skin breakdown. There is a small amount of serosanguineous drainage noted. The wound margin is distinct with the outline attached to the wound base. There is large (67-100%) red granulation within the wound bed. There is no necrotic tissue within the wound bed. The periwound skin appearance did not exhibit: Callus, Crepitus, Excoriation, Fluctuance, Friable, Induration, Localized Edema, Rash, Scarring, Dry/Scaly, Maceration, Moist, Atrophie Blanche, Cyanosis, Ecchymosis, Hemosiderin Staining, Mottled, Pallor, Rubor, Erythema. Periwound temperature was noted as No Abnormality. Wound #4 status is Open. Original cause of wound was Trauma. The wound is located on the Right,Posterior Lower Leg. The wound measures 1cm length x 0.7cm width x 0.1cm depth; 0.55cm^2 area and 0.055cm^3 volume. The wound is limited to skin breakdown. There is a small amount of [name], EISENBERGER. ([id number]) serosanguineous drainage noted. The wound margin is distinct with the outline attached to the wound base. There  is large (67-100%) red granulation within the wound bed. There is no necrotic tissue within the wound bed. The periwound skin appearance had no abnormalities noted for texture. The periwound skin appearance had no abnormalities noted for moisture. The periwound skin appearance had no abnormalities noted for color. Periwound temperature was noted as No Abnormality. Assessment Active Problems ICD-10 L97.222 - Non-pressure chronic ulcer of left calf with fat layer exposed I70.248 - Atherosclerosis of native arteries of left leg with ulceration of other part of lower left leg S81.811A - Laceration without foreign body, right lower leg, initial encounter We'll continue to use Mepitel and a border dressing over this and let this area heal over the next week. She will come back and see as in a week's time. Plan Wound Cleansing: Wound #3 Left,Posterior Lower Leg: Clean wound with Normal Saline. Cleanse wound with mild soap and water May Shower, gently pat wound dry prior to applying new dressing. May shower with protection. Wound #4 Right,Posterior Lower Leg: Clean wound with Normal Saline. Cleanse wound with mild soap and water May Shower, gently pat wound dry prior to applying new dressing. May shower with protection. Anesthetic: Wound #3 Left,Posterior Lower Leg: Topical Lidocaine 4% cream applied to wound bed prior to debridement Wound #4 Right,Posterior Lower Leg: Topical Lidocaine 4% cream applied to wound bed prior to debridement Primary Wound Dressing: Wound #3 Left,Posterior Lower Leg: Contact layer Wound #4 Right,Posterior Lower Leg: [name], [name] ([id number]) Contact layer Secondary Dressing: Wound #3 Left,Posterior Lower Leg: Dry Gauze - bolster with gauze Boardered Foam Dressing Wound #4 Right,Posterior Lower Leg: Dry Gauze - bolster with gauze Boardered Foam Dressing Dressing Change Frequency: Wound #3 Left,Posterior Lower Leg: Change dressing every week Wound  #4 Right,Posterior Lower Leg: Change dressing every week Follow-up Appointments: Wound #3 Left,Posterior Lower Leg: Return Appointment in 1 week. Wound #4 Right,Posterior Lower Leg: Return Appointment in 1 week. We'll continue to use Mepitel and a border dressing over this and let this area heal over the next week. She will come back and see as in a week's time Electronic Signature(s) Signed: 04/19/2015 4:40:03 PM By: Christin Fudge MD, FACS Entered By: Christin Fudge on 04/19/2015 13:33:15 [name] ([id number]) -------------------------------------------------------------------------------- SuperBill Details Patient Name: [name] Date of Service: 04/19/2015 Medical Record Number: [id number] Patient Account Number: [id number] Date of Birth/Sex: [birth year] (81 y.o. Female) Treating RN: Primary Care Physician: Pernell Dupre Other Clinician: Referring Physician: Pernell Dupre  Treating Physician/Extender: Frann Rider in Treatment: 10 Diagnosis Coding ICD-10 Codes Code Description [phone] Non-pressure chronic ulcer of left calf with fat layer exposed I70.248 Atherosclerosis of native arteries of left leg with ulceration of other part of lower left leg S81.811A Laceration without foreign body, right lower leg, initial encounter Facility Procedures CPT4 Code: [id number] Description: 99213 - WOUND CARE VISIT-LEV 3 EST PT Modifier: Quantity: 1 Physician Procedures CPT4: Description Modifier Quantity Code [id number] 11552 - WC PHYS LEVEL 3 - EST PT 1 ICD-10 Description Diagnosis L97.222 Non-pressure chronic ulcer of left calf with fat layer exposed I70.248 Atherosclerosis of native arteries of left leg with ulceration  of other part of lower left leg S81.811A Laceration without foreign body, right lower leg, initial encounter Electronic Signature(s) Signed: 04/19/2015 4:40:03 PM By: Christin Fudge MD, FACS Signed: 04/19/2015 5:10:45 PM By: Montey Hora Entered By:  Montey Hora on 04/19/2015 13:40:34

## 2015-04-20 NOTE — Progress Notes (Signed)
[name] ([id number]) Visit Report for 04/19/2015 Arrival Information Details Patient Name: [name] Date of Service: 04/19/2015 1:00 PM Medical Record Number: [id number] Patient Account Number: [id number] Date of Birth/Sex: [birth year] (81 y.o. Female) Treating RN: Cornell Barman Primary Care Physician: Pernell Dupre Other Clinician: Referring Physician: Pernell Dupre Treating Physician/Extender: Frann Rider in Treatment: 10 Visit Information History Since Last Visit Added or deleted any medications: No Patient Arrived: Wheel Chair Any new allergies or adverse reactions: No Arrival Time: 13:10 Had a fall or experienced change in No Accompanied By: daughter. Kim activities of daily living that may affect Transfer Assistance: None risk of falls: Patient Identification Verified: Yes Signs or symptoms of abuse/neglect since last No Secondary Verification Yes visito Process Completed: Hospitalized since last visit: No Patient Requires No Pain Present Now: No Transmission-Based Precautions: Patient Has Alerts: Yes Patient Alerts: Patient on Blood Thinner ABI,L:1.10,R:1.10 Electronic Signature(s) Signed: 04/20/2015 1:28:45 PM By: Gretta Cool, RN, BSN, Kim RN, BSN Entered By: Gretta Cool, RN, BSN, Kim on 04/19/2015 13:11:16 [name] ([id number]) -------------------------------------------------------------------------------- Clinic Level of Care Assessment Details Patient Name: [name] Date of Service: 04/19/2015 1:00 PM Medical Record Number: [id number] Patient Account Number: [id number] Date of Birth/Sex: 1922/02/16 (81 y.o. Female) Treating RN: Montey Hora Primary Care Physician: Pernell Dupre Other Clinician: Referring Physician: Pernell Dupre Treating Physician/Extender: Frann Rider in Treatment: 10 Clinic Level of Care Assessment Items TOOL 4 Quantity Score []  - Use when only an EandM is performed on FOLLOW-UP visit  0 ASSESSMENTS - Nursing Assessment / Reassessment X - Reassessment of Co-morbidities (includes updates in patient status) 1 10 X - Reassessment of Adherence to Treatment Plan 1 5 ASSESSMENTS - Wound and Skin Assessment / Reassessment []  - Simple Wound Assessment / Reassessment - one wound 0 X - Complex Wound Assessment / Reassessment - multiple wounds 2 5 []  - Dermatologic / Skin Assessment (not related to wound area) 0 ASSESSMENTS - Focused Assessment X - Circumferential Edema Measurements - multi extremities 2 5 []  - Nutritional Assessment / Counseling / Intervention 0 X - Lower Extremity Assessment (monofilament, tuning fork, pulses) 1 5 []  - Peripheral Arterial Disease Assessment (using hand held doppler) 0 ASSESSMENTS - Ostomy and/or Continence Assessment and Care []  - Incontinence Assessment and Management 0 []  - Ostomy Care Assessment and Management (repouching, etc.) 0 PROCESS - Coordination of Care X - Simple Patient / Family Education for ongoing care 1 15 []  - Complex (extensive) Patient / Family Education for ongoing care 0 []  - Staff obtains Programmer, systems, Records, Test Results / Process Orders 0 []  - Staff telephones HHA, Nursing Homes / Clarify orders / etc 0 []  - Routine Transfer to another Facility (non-emergent condition) 0 [name] ([id number]) []  - Routine Hospital Admission (non-emergent condition) 0 []  - New Admissions / Biomedical engineer / Ordering NPWT, Apligraf, etc. 0 []  - Emergency Hospital Admission (emergent condition) 0 X - Simple Discharge Coordination 1 10 []  - Complex (extensive) Discharge Coordination 0 PROCESS - Special Needs []  - Pediatric / Minor Patient Management 0 []  - Isolation Patient Management 0 []  - Hearing / Language / Visual special needs 0 []  - Assessment of Community assistance (transportation, D/C planning, etc.) 0 []  - Additional assistance / Altered mentation 0 []  - Support Surface(s) Assessment (bed, cushion, seat,  etc.) 0 INTERVENTIONS - Wound Cleansing / Measurement []  - Simple Wound Cleansing - one wound 0 X - Complex Wound Cleansing - multiple wounds 2 5 X - Wound Imaging (photographs -  any number of wounds) 1 5 []  - Wound Tracing (instead of photographs) 0 []  - Simple Wound Measurement - one wound 0 X - Complex Wound Measurement - multiple wounds 2 5 INTERVENTIONS - Wound Dressings X - Small Wound Dressing one or multiple wounds 2 10 []  - Medium Wound Dressing one or multiple wounds 0 []  - Large Wound Dressing one or multiple wounds 0 []  - Application of Medications - topical 0 []  - Application of Medications - injection 0 INTERVENTIONS - Miscellaneous []  - External ear exam 0 [name]. ([id number]) []  - Specimen Collection (cultures, biopsies, blood, body fluids, etc.) 0 []  - Specimen(s) / Culture(s) sent or taken to Lab for analysis 0 []  - Patient Transfer (multiple staff / Harrel Lemon Lift / Similar devices) 0 []  - Simple Staple / Suture removal (25 or less) 0 []  - Complex Staple / Suture removal (26 or more) 0 []  - Hypo / Hyperglycemic Management (close monitor of Blood Glucose) 0 []  - Ankle / Brachial Index (ABI) - do not check if billed separately 0 X - Vital Signs 1 5 Has the patient been seen at the hospital within the last three years: Yes Total Score: 115 Level Of Care: New/Established - Level 3 Electronic Signature(s) Signed: 04/19/2015 5:10:45 PM By: Montey Hora Entered By: Montey Hora on 04/19/2015 13:40:20 [name] ([id number]) -------------------------------------------------------------------------------- Encounter Discharge Information Details Patient Name: [name] Date of Service: 04/19/2015 1:00 PM Medical Record Number: [id number] Patient Account Number: [id number] Date of Birth/Sex: 12-May-1922 (81 y.o. Female) Treating RN: Primary Care Physician: Pernell Dupre Other Clinician: Referring Physician: Pernell Dupre Treating  Physician/Extender: Frann Rider in Treatment: 10 Encounter Discharge Information Items Discharge Pain Level: 0 Discharge Condition: Stable Ambulatory Status: Walker Discharge Destination: Home Transportation: Private Auto Accompanied By: daughter, Kim Schedule Follow-up Appointment: Yes Medication Reconciliation completed Yes and provided to Patient/Care Mashell Sieben: Provided on Clinical Summary of Care: 04/19/2015 Form Type Recipient Paper Patient JW Electronic Signature(s) Signed: 04/20/2015 1:28:45 PM By: Gretta Cool RN, BSN, Kim RN, BSN Previous Signature: 04/19/2015 1:34:14 PM Version By: Ruthine Dose Entered By: Gretta Cool RN, BSN, Kim on 04/19/2015 13:38:18 [name] ([id number]) -------------------------------------------------------------------------------- Lower Extremity Assessment Details Patient Name: [name] Date of Service: 04/19/2015 1:00 PM Medical Record Number: [id number] Patient Account Number: [id number] Date of Birth/Sex: October 20, 1922 (81 y.o. Female) Treating RN: Cornell Barman Primary Care Physician: Pernell Dupre Other Clinician: Referring Physician: Pernell Dupre Treating Physician/Extender: Frann Rider in Treatment: 10 Edema Assessment Assessed: [Left: No] [Right: No] E[Left: dema] [Right: :] Calf Left: Right: Point of Measurement: cm From Medial Instep 37 cm 36.2 cm Ankle Left: Right: Point of Measurement: cm From Medial Instep 21.4 cm 12 cm Vascular Assessment Pulses: Posterior Tibial Dorsalis Pedis Palpable: [Left:Yes] [Right:Yes] Extremity colors, hair growth, and conditions: Extremity Color: [Left:Hyperpigmented] [Right:Hyperpigmented] Hair Growth on Extremity: [Left:Yes] [Right:Yes] Temperature of Extremity: [Left:Warm] [Right:Warm] Capillary Refill: [Left:< 3 seconds] [Right:< 3 seconds] Toe Nail Assessment Left: Right: Thick: No No Discolored: No No Deformed: No No Improper Length and Hygiene: No  No Electronic Signature(s) Signed: 04/20/2015 1:28:45 PM By: Gretta Cool, RN, BSN, Kim RN, BSN Entered By: Gretta Cool, RN, BSN, Kim on 04/19/2015 13:18:27 [name] ([id number]) -------------------------------------------------------------------------------- Multi Wound Chart Details Patient Name: [name] Date of Service: 04/19/2015 1:00 PM Medical Record Number: [id number] Patient Account Number: [id number] Date of Birth/Sex: 1922/08/20 (81 y.o. Female) Treating RN: Montey Hora Primary Care Physician: Pernell Dupre Other Clinician: Referring Physician: Pernell Dupre Treating Physician/Extender: Christin Fudge  Weeks in Treatment: 10 Vital Signs Height(in): 63 Pulse(bpm): 78 Weight(lbs): 148 Blood Pressure 122/78 (mmHg): Body Mass Index(BMI): 26 Temperature(F): 97.5 Respiratory Rate 16 (breaths/min): Photos: [N/A:N/A] Wound Location: Left Lower Leg - Posterior Right Lower Leg - N/A Posterior Wounding Event: Gradually Appeared Trauma N/A Primary Etiology: Infection - not elsewhere Trauma, Other N/A classified Comorbid History: Cataracts, Angina, Cataracts, Angina, N/A Arrhythmia, Congestive Arrhythmia, Congestive Heart Failure, Heart Failure, Hypertension, Hepatitis B, Hypertension, Hepatitis B, Osteoarthritis, Seizure Osteoarthritis, Seizure Disorder Disorder Date Acquired: 01/08/2015 04/05/2015 N/A Weeks of Treatment: 10 1 N/A Wound Status: Open Open N/A Measurements L x W x D 0.2x0.2x0.1 1x0.7x0.1 N/A (cm) Area (cm) : 0.031 0.55 N/A Volume (cm) : 0.003 0.055 N/A % Reduction in Area: 99.20% 12.40% N/A % Reduction in Volume: 99.60% 12.70% N/A Classification: Full Thickness Without Partial Thickness N/A Exposed Support Structures HIAWATHA, DRESSEL ([id number]) Exudate Amount: Small Small N/A Exudate Type: Serosanguineous Serosanguineous N/A Exudate Color: red, brown red, brown N/A Wound Margin: Distinct, outline attached Distinct, outline attached  N/A Granulation Amount: Large (67-100%) Large (67-100%) N/A Granulation Quality: Red Red N/A Necrotic Amount: None Present (0%) None Present (0%) N/A Exposed Structures: Fascia: No Fascia: No N/A Fat: No Fat: No Tendon: No Tendon: No Muscle: No Muscle: No Joint: No Joint: No Bone: No Bone: No Limited to Skin Limited to Skin Breakdown Breakdown Epithelialization: Small (1-33%) N/A N/A Periwound Skin Texture: Edema: No No Abnormalities Noted N/A Excoriation: No Induration: No Callus: No Crepitus: No Fluctuance: No Friable: No Rash: No Scarring: No Periwound Skin Maceration: No No Abnormalities Noted N/A Moisture: Moist: No Dry/Scaly: No Periwound Skin Color: Atrophie Blanche: No No Abnormalities Noted N/A Cyanosis: No Ecchymosis: No Erythema: No Hemosiderin Staining: No Mottled: No Pallor: No Rubor: No Temperature: No Abnormality No Abnormality N/A Tenderness on No No N/A Palpation: Wound Preparation: Ulcer Cleansing: Ulcer Cleansing: N/A Rinsed/Irrigated with Rinsed/Irrigated with Saline Saline Topical Anesthetic Topical Anesthetic Applied: None Applied: Other: 4% Lidocaine Treatment Notes RENAY, CRAMMER ([id number]) Electronic Signature(s) Signed: 04/19/2015 5:10:45 PM By: Montey Hora Entered By: Montey Hora on 04/19/2015 13:28:18 [name] ([id number]) -------------------------------------------------------------------------------- [place] Details Patient Name: [name] Date of Service: 04/19/2015 1:00 PM Medical Record Number: [id number] Patient Account Number: [id number] Date of Birth/Sex: 01/10/1922 (81 y.o. Female) Treating RN: Montey Hora Primary Care Physician: Pernell Dupre Other Clinician: Referring Physician: Pernell Dupre Treating Physician/Extender: Frann Rider in Treatment: 10 Active Inactive Abuse / Safety / Falls / Self Care Management Nursing Diagnoses: Impaired  physical mobility Potential for falls Goals: Patient will remain injury free Date Initiated: 02/06/2015 Goal Status: Active Patient/caregiver will verbalize understanding of skin care regimen Date Initiated: 02/06/2015 Goal Status: Active Patient/caregiver will verbalize/demonstrate measures taken to prevent injury and/or falls Date Initiated: 02/06/2015 Goal Status: Active Interventions: Provide education on fall prevention Provide education on safe transfers Notes: Orientation to the Wound Care Program Nursing Diagnoses: Knowledge deficit related to the wound healing center program Goals: Patient/caregiver will verbalize understanding of the [place] Program Date Initiated: 02/06/2015 Goal Status: Active Interventions: Provide education on orientation to the wound center Notes: AROHI, SALVATIERRA ([id number]) Wound/Skin Impairment Nursing Diagnoses: Impaired tissue integrity Knowledge deficit related to ulceration/compromised skin integrity Goals: Patient/caregiver will verbalize understanding of skin care regimen Date Initiated: 02/06/2015 Goal Status: Active Ulcer/skin breakdown will heal within 14 weeks Date Initiated: 02/06/2015 Goal Status: Active Interventions: Assess patient/caregiver ability to obtain necessary supplies Assess patient/caregiver ability to perform ulcer/skin care regimen upon admission and as  needed Assess ulceration(s) every visit Provide education on ulcer and skin care Treatment Activities: Skin care regimen initiated : 04/19/2015 Topical wound management initiated : 04/19/2015 Notes: Electronic Signature(s) Signed: 04/19/2015 5:10:45 PM By: Montey Hora Entered By: Montey Hora on 04/19/2015 13:28:00 [name] ([id number]) -------------------------------------------------------------------------------- Pain Assessment Details Patient Name: [name] Date of Service: 04/19/2015 1:00 PM Medical Record Number:  [id number] Patient Account Number: [id number] Date of Birth/Sex: 1922-04-02 (81 y.o. Female) Treating RN: Cornell Barman Primary Care Physician: Pernell Dupre Other Clinician: Referring Physician: Pernell Dupre Treating Physician/Extender: Frann Rider in Treatment: 10 Active Problems Location of Pain Severity and Description of Pain Patient Has Paino No Site Locations Pain Management and Medication Current Pain Management: Electronic Signature(s) Signed: 04/20/2015 1:28:45 PM By: Gretta Cool, RN, BSN, Kim RN, BSN Entered By: Gretta Cool, RN, BSN, Kim on 04/19/2015 13:11:22 [name] ([id number]) -------------------------------------------------------------------------------- Patient/Caregiver Education Details Patient Name: [name] Date of Service: 04/19/2015 1:00 PM Medical Record Number: [id number] Patient Account Number: [id number] Date of Birth/Gender: [birth year] (81 y.o. Female) Treating RN: Cornell Barman Primary Care Physician: Pernell Dupre Other Clinician: Referring Physician: Pernell Dupre Treating Physician/Extender: Frann Rider in Treatment: 10 Education Assessment Education Provided To: Patient Education Topics Provided Wound/Skin Impairment: Handouts: Other: wound care as ordered. Electronic Signature(s) Signed: 04/20/2015 1:28:45 PM By: Gretta Cool, RN, BSN, Kim RN, BSN Entered By: Gretta Cool, RN, BSN, Kim on 04/19/2015 13:38:36 [name] ([id number]) -------------------------------------------------------------------------------- Wound Assessment Details Patient Name: JACQULIN, BRANDENBURGER Date of Service: 04/19/2015 1:00 PM Medical Record Number: [id number] Patient Account Number: [id number] Date of Birth/Sex: [birth year] (81 y.o. Female) Treating RN: Cornell Barman Primary Care Physician: Pernell Dupre Other Clinician: Referring Physician: Pernell Dupre Treating Physician/Extender: Frann Rider in Treatment: 10 Wound  Status Wound Number: 3 Primary Infection - not elsewhere classified Etiology: Wound Location: Left Lower Leg - Posterior Wound Open Wounding Event: Gradually Appeared Status: Date Acquired: 01/08/2015 Comorbid Cataracts, Angina, Arrhythmia, Weeks Of Treatment: 10 History: Congestive Heart Failure, Hypertension, Clustered Wound: No Hepatitis B, Osteoarthritis, Seizure Disorder Photos Photo Uploaded By: Gretta Cool, RN, BSN, Kim on 04/19/2015 13:25:55 Wound Measurements Length: (cm) 0.2 Width: (cm) 0.2 Depth: (cm) 0.1 Area: (cm) 0.031 Volume: (cm) 0.003 % Reduction in Area: 99.2% % Reduction in Volume: 99.6% Epithelialization: Small (1-33%) Wound Description Full Thickness Without Exposed Foul Odor A Classification: Support Structures Wound Margin: Distinct, outline attached Exudate Small Amount: Exudate Type: Serosanguineous Exudate Color: red, brown fter Cleansing: No Wound Bed Granulation Amount: Large (67-100%) Exposed Structure FRUMA, [place] ([id number]) Granulation Quality: Red Fascia Exposed: No Necrotic Amount: None Present (0%) Fat Layer Exposed: No Tendon Exposed: No Muscle Exposed: No Joint Exposed: No Bone Exposed: No Limited to Skin Breakdown Periwound Skin Texture Texture Color No Abnormalities Noted: No No Abnormalities Noted: No Callus: No Atrophie Blanche: No Crepitus: No Cyanosis: No Excoriation: No Ecchymosis: No Fluctuance: No Erythema: No Friable: No Hemosiderin Staining: No Induration: No Mottled: No Localized Edema: No Pallor: No Rash: No Rubor: No Scarring: No Temperature / Pain Moisture Temperature: No Abnormality No Abnormalities Noted: No Dry / Scaly: No Maceration: No Moist: No Wound Preparation Ulcer Cleansing: Rinsed/Irrigated with Saline Topical Anesthetic Applied: None Treatment Notes Wound #3 (Left, Posterior Lower Leg) 1. Cleansed with: Clean wound with Normal Saline 2. Anesthetic Topical Lidocaine 4%  cream to wound bed prior to debridement 4. Dressing Applied: Contact layer 5. Secondary Dressing Applied Bordered Foam Dressing Electronic Signature(s) Signed: 04/20/2015 1:28:45 PM By: Gretta Cool, RN, BSN, Kim RN, BSN Entered By:  Gretta Cool, RN, BSN, Kim on 04/19/2015 13:19:27 Marshae, Azam [name] ([id number]) -------------------------------------------------------------------------------- Wound Assessment Details Patient Name: FALLYN, MUNNERLYN Date of Service: 04/19/2015 1:00 PM Medical Record Number: [id number] Patient Account Number: [id number] Date of Birth/Sex: July 06, 1922 (81 y.o. Female) Treating RN: Cornell Barman Primary Care Physician: Pernell Dupre Other Clinician: Referring Physician: Pernell Dupre Treating Physician/Extender: Frann Rider in Treatment: 10 Wound Status Wound Number: 4 Primary Trauma, Other Etiology: Wound Location: Right Lower Leg - Posterior Wound Open Wounding Event: Trauma Status: Date Acquired: 04/05/2015 Comorbid Cataracts, Angina, Arrhythmia, Weeks Of Treatment: 1 History: Congestive Heart Failure, Hypertension, Clustered Wound: No Hepatitis B, Osteoarthritis, Seizure Disorder Photos Photo Uploaded By: Gretta Cool, RN, BSN, Kim on 04/19/2015 13:25:55 Wound Measurements Length: (cm) 1 Width: (cm) 0.7 Depth: (cm) 0.1 Area: (cm) 0.55 Volume: (cm) 0.055 % Reduction in Area: 12.4% % Reduction in Volume: 12.7% Wound Description Classification: Partial Thickness Wound Margin: Distinct, outline attached Exudate Amount: Small Exudate Type: Serosanguineous Exudate Color: red, brown Foul Odor After Cleansing: No Wound Bed Granulation Amount: Large (67-100%) Exposed Structure Granulation Quality: Red Fascia Exposed: No Necrotic Amount: None Present (0%) Fat Layer Exposed: No COLETA, GROSSHANS ([id number]) Tendon Exposed: No Muscle Exposed: No Joint Exposed: No Bone Exposed: No Limited to Skin Breakdown Periwound Skin Texture Texture  Color No Abnormalities Noted: Yes No Abnormalities Noted: Yes Moisture Temperature / Pain No Abnormalities Noted: Yes Temperature: No Abnormality Wound Preparation Ulcer Cleansing: Rinsed/Irrigated with Saline Topical Anesthetic Applied: Other: 4% Lidocaine, Treatment Notes Wound #4 (Right, Posterior Lower Leg) 1. Cleansed with: Clean wound with Normal Saline 2. Anesthetic Topical Lidocaine 4% cream to wound bed prior to debridement 4. Dressing Applied: Contact layer 5. Secondary Dressing Applied Bordered Foam Dressing Electronic Signature(s) Signed: 04/20/2015 1:28:45 PM By: Gretta Cool, RN, BSN, Kim RN, BSN Entered By: Gretta Cool, RN, BSN, Kim on 04/19/2015 13:20:04 [name] ([id number]) -------------------------------------------------------------------------------- [place] Details Patient Name: [name] Date of Service: 04/19/2015 1:00 PM Medical Record Number: [id number] Patient Account Number: [id number] Date of Birth/Sex: Nov 23, 1921 (81 y.o. Female) Treating RN: Cornell Barman Primary Care Physician: Pernell Dupre Other Clinician: Referring Physician: Pernell Dupre Treating Physician/Extender: Frann Rider in Treatment: 10 Vital Signs Time Taken: 13:10 Temperature (F): 97.5 Height (in): 63 Pulse (bpm): 78 Weight (lbs): 148 Respiratory Rate (breaths/min): 16 Body Mass Index (BMI): 26.2 Blood Pressure (mmHg): 122/78 Reference Range: 80 - 120 mg / dl Electronic Signature(s) Signed: 04/20/2015 1:28:45 PM By: Gretta Cool, RN, BSN, Kim RN, BSN Entered By: Gretta Cool, RN, BSN, Kim on 04/19/2015 13:10:55

## 2015-04-23 ENCOUNTER — Other Ambulatory Visit: Payer: Self-pay | Admitting: Cardiovascular Disease

## 2015-04-25 ENCOUNTER — Encounter: Payer: Self-pay | Admitting: Cardiology

## 2015-04-26 ENCOUNTER — Encounter: Payer: Medicare Other | Admitting: Surgery

## 2015-04-26 DIAGNOSIS — L97222 Non-pressure chronic ulcer of left calf with fat layer exposed: Secondary | ICD-10-CM | POA: Diagnosis not present

## 2015-04-27 NOTE — Progress Notes (Signed)
[name], [name] ([id number]) Visit Report for 04/26/2015 Arrival Information Details Patient Name: [name], [name] Date of Service: 04/26/2015 1:45 PM Medical Record Number: [id number] Patient Account Number: [id number] Date of Birth/Sex: [birth year] (81 y.o. Female) Treating RN: Afful, RN, BSN, Velva Harman Primary Care Physician: Pernell Dupre Other Clinician: Referring Physician: Pernell Dupre Treating Physician/Extender: Frann Rider in Treatment: 11 Visit Information History Since Last Visit Any new allergies or adverse reactions: No Patient Arrived: Wheel Chair Had a fall or experienced change in No Arrival Time: 13:26 activities of daily living that may affect Accompanied By: niece risk of falls: Transfer Assistance: None Signs or symptoms of abuse/neglect since last No Patient Identification Verified: Yes visito Secondary Verification Yes Has Dressing in Place as Prescribed: Yes Process Completed: Pain Present Now: No Patient Requires No Transmission-Based Precautions: Patient Has Alerts: Yes Patient Alerts: Patient on Blood Thinner ABI,L:1.10,R:1.10 Electronic Signature(s) Signed: 04/26/2015 4:05:06 PM By: Regan Lemming BSN, RN Entered By: Regan Lemming on 04/26/2015 13:26:39 [name] ([id number]) -------------------------------------------------------------------------------- Clinic Level of Care Assessment Details Patient Name: [name] Date of Service: 04/26/2015 1:45 PM Medical Record Number: [id number] Patient Account Number: [id number] Date of Birth/Sex: 03-Dec-1921 (81 y.o. Female) Treating RN: Montey Hora Primary Care Physician: Pernell Dupre Other Clinician: Referring Physician: Pernell Dupre Treating Physician/Extender: Frann Rider in Treatment: 11 Clinic Level of Care Assessment Items TOOL 4 Quantity Score []  - Use when only an EandM is performed on FOLLOW-UP visit 0 ASSESSMENTS - Nursing Assessment /  Reassessment X - Reassessment of Co-morbidities (includes updates in patient status) 1 10 X - Reassessment of Adherence to Treatment Plan 1 5 ASSESSMENTS - Wound and Skin Assessment / Reassessment X - Simple Wound Assessment / Reassessment - one wound 1 5 []  - Complex Wound Assessment / Reassessment - multiple wounds 0 []  - Dermatologic / Skin Assessment (not related to wound area) 0 ASSESSMENTS - Focused Assessment X - Circumferential Edema Measurements - multi extremities 1 5 []  - Nutritional Assessment / Counseling / Intervention 0 X - Lower Extremity Assessment (monofilament, tuning fork, pulses) 1 5 []  - Peripheral Arterial Disease Assessment (using hand held doppler) 0 ASSESSMENTS - Ostomy and/or Continence Assessment and Care []  - Incontinence Assessment and Management 0 []  - Ostomy Care Assessment and Management (repouching, etc.) 0 PROCESS - Coordination of Care X - Simple Patient / Family Education for ongoing care 1 15 []  - Complex (extensive) Patient / Family Education for ongoing care 0 []  - Staff obtains Programmer, systems, Records, Test Results / Process Orders 0 []  - Staff telephones HHA, Nursing Homes / Clarify orders / etc 0 []  - Routine Transfer to another Facility (non-emergent condition) 0 [name] ([id number]) []  - Routine Hospital Admission (non-emergent condition) 0 []  - New Admissions / Biomedical engineer / Ordering NPWT, Apligraf, etc. 0 []  - Emergency Hospital Admission (emergent condition) 0 X - Simple Discharge Coordination 1 10 []  - Complex (extensive) Discharge Coordination 0 PROCESS - Special Needs []  - Pediatric / Minor Patient Management 0 []  - Isolation Patient Management 0 []  - Hearing / Language / Visual special needs 0 []  - Assessment of Community assistance (transportation, D/C planning, etc.) 0 []  - Additional assistance / Altered mentation 0 []  - Support Surface(s) Assessment (bed, cushion, seat, etc.) 0 INTERVENTIONS - Wound Cleansing /  Measurement X - Simple Wound Cleansing - one wound 1 5 []  - Complex Wound Cleansing - multiple wounds 0 X - Wound Imaging (photographs - any number of wounds) 1 5 []  -  Wound Tracing (instead of photographs) 0 X - Simple Wound Measurement - one wound 1 5 []  - Complex Wound Measurement - multiple wounds 0 INTERVENTIONS - Wound Dressings X - Small Wound Dressing one or multiple wounds 1 10 []  - Medium Wound Dressing one or multiple wounds 0 []  - Large Wound Dressing one or multiple wounds 0 []  - Application of Medications - topical 0 []  - Application of Medications - injection 0 INTERVENTIONS - Miscellaneous []  - External ear exam 0 [name] ([id number]) []  - Specimen Collection (cultures, biopsies, blood, body fluids, etc.) 0 []  - Specimen(s) / Culture(s) sent or taken to Lab for analysis 0 []  - Patient Transfer (multiple staff / Harrel Lemon Lift / Similar devices) 0 []  - Simple Staple / Suture removal (25 or less) 0 []  - Complex Staple / Suture removal (26 or more) 0 []  - Hypo / Hyperglycemic Management (close monitor of Blood Glucose) 0 []  - Ankle / Brachial Index (ABI) - do not check if billed separately 0 X - Vital Signs 1 5 Has the patient been seen at the hospital within the last three years: Yes Total Score: 85 Level Of Care: New/Established - Level 3 Electronic Signature(s) Signed: 04/26/2015 5:03:06 PM By: Montey Hora Entered By: Montey Hora on 04/26/2015 13:52:27 [name] ([id number]) -------------------------------------------------------------------------------- Encounter Discharge Information Details Patient Name: [name] Date of Service: 04/26/2015 1:45 PM Medical Record Number: [id number] Patient Account Number: [id number] Date of Birth/Sex: Mar 24, 1922 (81 y.o. Female) Treating RN: Primary Care Physician: Pernell Dupre Other Clinician: Referring Physician: Pernell Dupre Treating Physician/Extender: Frann Rider in Treatment:  35 Encounter Discharge Information Items Schedule Follow-up Appointment: No Medication Reconciliation completed No and provided to Patient/Care [name]: Provided on Clinical Summary of Care: 04/26/2015 Form Type Recipient Paper Patient JW Electronic Signature(s) Signed: 04/26/2015 1:56:04 PM By: Ruthine Dose Entered By: Ruthine Dose on 04/26/2015 13:56:04 [name] ([id number]) -------------------------------------------------------------------------------- Lower Extremity Assessment Details Patient Name: [name] Date of Service: 04/26/2015 1:45 PM Medical Record Number: [id number] Patient Account Number: [id number] Date of Birth/Sex: 02/14/1922 (81 y.o. Female) Treating RN: Afful, RN, BSN, [place] Primary Care Physician: Pernell Dupre Other Clinician: Referring Physician: Pernell Dupre Treating Physician/Extender: Frann Rider in Treatment: 11 Edema Assessment Assessed: [Left: No] [Right: No] E[Left: dema] [Right: :] Calf Left: Right: Point of Measurement: 26 cm From Medial Instep 37.1 cm 36.2 cm Ankle Left: Right: Point of Measurement: 8 cm From Medial Instep 21.5 cm 21.2 cm Vascular Assessment Claudication: Claudication Assessment [Left:None] [Right:None] Pulses: Posterior Tibial Dorsalis Pedis Palpable: [Left:Yes] [Right:Yes] Extremity colors, hair growth, and conditions: Extremity Color: [Left:Mottled] [Right:Mottled] Hair Growth on Extremity: [Left:Yes] [Right:Yes] Temperature of Extremity: [Left:Warm] [Right:Warm] Capillary Refill: [Left:< 3 seconds] [Right:< 3 seconds] Dependent Rubor: [Left:No] [Right:No] Blanched when Elevated: [Left:No] Lipodermatosclerosis: [Left:No] [Right:No] Toe Nail Assessment Left: Right: Thick: No No Discolored: Yes Yes Deformed: No No Improper Length and Hygiene: No JILL, STOPKA ([id number]) Electronic Signature(s) Signed: 04/26/2015 4:05:06 PM By: Regan Lemming BSN, RN Entered By: Regan Lemming  on 04/26/2015 13:31:36 [name] ([id number]) -------------------------------------------------------------------------------- Multi Wound Chart Details Patient Name: [name] Date of Service: 04/26/2015 1:45 PM Medical Record Number: [id number] Patient Account Number: [id number] Date of Birth/Sex: 12/19/1921 (81 y.o. Female) Treating RN: Montey Hora Primary Care Physician: Pernell Dupre Other Clinician: Referring Physician: Pernell Dupre Treating Physician/Extender: Frann Rider in Treatment: 11 Vital Signs Height(in): 63 Pulse(bpm): 65 Weight(lbs): 148 Blood Pressure 115/72 (mmHg): Body Mass Index(BMI): 26 Temperature(F): 97.6 Respiratory Rate  16 (breaths/min): Photos: [3:No Photos] [4:No Photos] [N/A:N/A] Wound Location: [3:Left Lower Leg - Posterior Right Lower Leg -] [4:Posterior] [N/A:N/A] Wounding Event: [3:Gradually Appeared] [4:Trauma] [N/A:N/A] Primary Etiology: [3:Infection - not elsewhere Trauma, Other classified] [N/A:N/A] Comorbid History: [3:Cataracts, Angina, Arrhythmia, Congestive Arrhythmia, Congestive Heart Failure, Hypertension, Hepatitis B, Hypertension, Hepatitis B, Osteoarthritis, Seizure Disorder] [4:Cataracts, Angina, Heart Failure, Osteoarthritis, Seizure  Disorder] [N/A:N/A] Date Acquired: [3:01/08/2015] [4:04/05/2015] [N/A:N/A] Weeks of Treatment: [3:11] [4:2] [N/A:N/A] Wound Status: [3:Open] [4:Healed - Epithelialized] [N/A:N/A] Measurements L x W x D 0.5x0.5x0.1 [4:0x0x0] [N/A:N/A] (cm) Area (cm) : [3:0.196] [4:0] [N/A:N/A] Volume (cm) : [3:0.02] [4:0] [N/A:N/A] % Reduction in Area: [3:95.00%] [4:100.00%] [N/A:N/A] % Reduction in Volume: 97.50% [4:100.00%] [N/A:N/A] Classification: [3:Full Thickness Without Exposed Support Structures] [4:Partial Thickness] [N/A:N/A] Exudate Amount: [3:Small] [4:Small] [N/A:N/A] Exudate Type: [3:Serosanguineous] [4:Serosanguineous] [N/A:N/A] Exudate Color: [3:red, brown]  [4:red, brown] [N/A:N/A] Wound Margin: [3:Distinct, outline attached Distinct, outline attached N/A] Granulation Amount: [3:Large (67-100%)] [4:Large (67-100%)] [N/A:N/A] Granulation Quality: [3:Red] [4:Red] [N/A:N/A] Necrotic Amount: None Present (0%) None Present (0%) N/A Exposed Structures: Fascia: No Fascia: No N/A Fat: No Fat: No Tendon: No Tendon: No Muscle: No Muscle: No Joint: No Joint: No Bone: No Bone: No Limited to Skin Limited to Skin Breakdown Breakdown Epithelialization: Large (67-100%) Large (67-100%) N/A Periwound Skin Texture: Edema: No No Abnormalities Noted N/A Excoriation: No Induration: No Callus: No Crepitus: No Fluctuance: No Friable: No Rash: No Scarring: No Periwound Skin Moist: Yes No Abnormalities Noted N/A Moisture: Maceration: No Dry/Scaly: No Periwound Skin Color: Atrophie Blanche: No No Abnormalities Noted N/A Cyanosis: No Ecchymosis: No Erythema: No Hemosiderin Staining: No Mottled: No Pallor: No Rubor: No Temperature: No Abnormality No Abnormality N/A Tenderness on No No N/A Palpation: Wound Preparation: Ulcer Cleansing: Ulcer Cleansing: N/A Rinsed/Irrigated with Rinsed/Irrigated with Saline Saline Topical Anesthetic Applied: None Treatment Notes Electronic Signature(s) Signed: 04/26/2015 5:03:06 PM By: Montey Hora Entered By: Montey Hora on 04/26/2015 13:46:52 [name] ([id number]) -------------------------------------------------------------------------------- [place] Details Patient Name: [name] Date of Service: 04/26/2015 1:45 PM Medical Record Number: [id number] Patient Account Number: [id number] Date of Birth/Sex: 1922/05/29 (81 y.o. Female) Treating RN: Montey Hora Primary Care Physician: Pernell Dupre Other Clinician: Referring Physician: Pernell Dupre Treating Physician/Extender: Frann Rider in Treatment: 1 Active Inactive Abuse / Safety / Falls  / Self Care Management Nursing Diagnoses: Impaired physical mobility Potential for falls Goals: Patient will remain injury free Date Initiated: 02/06/2015 Goal Status: Active Patient/caregiver will verbalize understanding of skin care regimen Date Initiated: 02/06/2015 Goal Status: Active Patient/caregiver will verbalize/demonstrate measures taken to prevent injury and/or falls Date Initiated: 02/06/2015 Goal Status: Active Interventions: Provide education on fall prevention Provide education on safe transfers Notes: Orientation to the Wound Care Program Nursing Diagnoses: Knowledge deficit related to the wound healing center program Goals: Patient/caregiver will verbalize understanding of the [place] Program Date Initiated: 02/06/2015 Goal Status: Active Interventions: Provide education on orientation to the wound center Notes: NICKOL, COLLISTER ([id number]) Wound/Skin Impairment Nursing Diagnoses: Impaired tissue integrity Knowledge deficit related to ulceration/compromised skin integrity Goals: Patient/caregiver will verbalize understanding of skin care regimen Date Initiated: 02/06/2015 Goal Status: Active Ulcer/skin breakdown will heal within 14 weeks Date Initiated: 02/06/2015 Goal Status: Active Interventions: Assess patient/caregiver ability to obtain necessary supplies Assess patient/caregiver ability to perform ulcer/skin care regimen upon admission and as needed Assess ulceration(s) every visit Provide education on ulcer and skin care Treatment Activities: Skin care regimen initiated : 04/26/2015 Topical wound management initiated : 04/26/2015 Notes: Electronic Signature(s) Signed:  04/26/2015 5:03:06 PM By: Montey Hora Entered By: Montey Hora on 04/26/2015 13:46:20 [name] ([id number]) -------------------------------------------------------------------------------- Pain Assessment Details Patient Name: [name] Date of  Service: 04/26/2015 1:45 PM Medical Record Number: [id number] Patient Account Number: [id number] Date of Birth/Sex: May 16, 1922 (81 y.o. Female) Treating RN: Baruch Gouty, RN, BSN, Velva Harman Primary Care Physician: Pernell Dupre Other Clinician: Referring Physician: Pernell Dupre Treating Physician/Extender: Frann Rider in Treatment: 11 Active Problems Location of Pain Severity and Description of Pain Patient Has Paino No Site Locations Pain Management and Medication Current Pain Management: Electronic Signature(s) Signed: 04/26/2015 4:05:06 PM By: Regan Lemming BSN, RN Entered By: Regan Lemming on 04/26/2015 13:29:04 [name] ([id number]) -------------------------------------------------------------------------------- Patient/Caregiver Education Details Patient Name: [name] Date of Service: 04/26/2015 1:45 PM Medical Record Number: [id number] Patient Account Number: [id number] Date of Birth/Gender: 07-20-1922 (81 y.o. Female) Treating RN: Montey Hora Primary Care Physician: Pernell Dupre Other Clinician: Referring Physician: Pernell Dupre Treating Physician/Extender: Frann Rider in Treatment: 11 Education Assessment Education Provided To: Patient and Caregiver Education Topics Provided Wound/Skin Impairment: Handouts: Other: wound care as ordered Methods: Demonstration, Explain/Verbal Responses: State content correctly Electronic Signature(s) Signed: 04/26/2015 5:03:06 PM By: Montey Hora Entered By: Montey Hora on 04/26/2015 13:52:56 [name] ([id number]) -------------------------------------------------------------------------------- Wound Assessment Details Patient Name: [name] Date of Service: 04/26/2015 1:45 PM Medical Record Number: [id number] Patient Account Number: [id number] Date of Birth/Sex: 1922-10-01 (81 y.o. Female) Treating RN: Afful, RN, BSN, [place] Primary Care Physician: Pernell Dupre Other  Clinician: Referring Physician: Pernell Dupre Treating Physician/Extender: Frann Rider in Treatment: 11 Wound Status Wound Number: 3 Primary Infection - not elsewhere classified Etiology: Wound Location: Left Lower Leg - Posterior Wound Open Wounding Event: Gradually Appeared Status: Date Acquired: 01/08/2015 Comorbid Cataracts, Angina, Arrhythmia, Weeks Of Treatment: 11 History: Congestive Heart Failure, Hypertension, Clustered Wound: No Hepatitis B, Osteoarthritis, Seizure Disorder Photos Photo Uploaded By: Regan Lemming on 04/26/2015 16:04:15 Wound Measurements Length: (cm) 0.5 Width: (cm) 0.5 Depth: (cm) 0.1 Area: (cm) 0.196 Volume: (cm) 0.02 % Reduction in Area: 95% % Reduction in Volume: 97.5% Epithelialization: Large (67-100%) Tunneling: No Undermining: No Wound Description Full Thickness Without Exposed Foul Odor Af Classification: Support Structures Wound Margin: Distinct, outline attached Exudate Small Amount: Exudate Type: Serosanguineous Exudate Color: red, brown ter Cleansing: No Wound Bed Granulation Amount: Large (67-100%) Exposed Structure GOLDEN, EMILE ([id number]) Granulation Quality: Red Fascia Exposed: No Necrotic Amount: None Present (0%) Fat Layer Exposed: No Tendon Exposed: No Muscle Exposed: No Joint Exposed: No Bone Exposed: No Limited to Skin Breakdown Periwound Skin Texture Texture Color No Abnormalities Noted: No No Abnormalities Noted: No Callus: No Atrophie Blanche: No Crepitus: No Cyanosis: No Excoriation: No Ecchymosis: No Fluctuance: No Erythema: No Friable: No Hemosiderin Staining: No Induration: No Mottled: No Localized Edema: No Pallor: No Rash: No Rubor: No Scarring: No Temperature / Pain Moisture Temperature: No Abnormality No Abnormalities Noted: No Dry / Scaly: No Maceration: No Moist: Yes Wound Preparation Ulcer Cleansing: Rinsed/Irrigated with Saline Topical Anesthetic Applied:  None Electronic Signature(s) Signed: 04/26/2015 4:05:06 PM By: Regan Lemming BSN, RN Entered By: Regan Lemming on 04/26/2015 13:34:46 [name] ([id number]) -------------------------------------------------------------------------------- Wound Assessment Details Patient Name: [name] Date of Service: 04/26/2015 1:45 PM Medical Record Number: [id number] Patient Account Number: [id number] Date of Birth/Sex: 1921-12-27 (81 y.o. Female) Treating RN: Afful, RN, BSN, Velva Harman Primary Care Physician: Pernell Dupre Other Clinician: Referring Physician: Pernell Dupre Treating Physician/Extender: Frann Rider in Treatment: 11 Wound Status Wound  Number: 4 Primary Trauma, Other Etiology: Wound Location: Right Lower Leg - Posterior Wound Healed - Epithelialized Wounding Event: Trauma Status: Date Acquired: 04/05/2015 Comorbid Cataracts, Angina, Arrhythmia, Weeks Of Treatment: 2 History: Congestive Heart Failure, Hypertension, Clustered Wound: No Hepatitis B, Osteoarthritis, Seizure Disorder Photos Photo Uploaded By: Regan Lemming on 04/26/2015 16:04:35 Wound Measurements Length: (cm) Width: (cm) Depth: (cm) Area: (cm) Volume: (cm) 0 % Reduction in Area: 100% 0 % Reduction in Volume: 100% 0 Epithelialization: Large (67-100%) 0 Tunneling: No 0 Undermining: No Wound Description Classification: Partial Thickness Wound Margin: Distinct, outline attached Exudate Amount: Small Exudate Type: Serosanguineous Exudate Color: red, brown Foul Odor After Cleansing: No Wound Bed Granulation Amount: Large (67-100%) Exposed Structure Granulation Quality: Red Fascia Exposed: No Necrotic Amount: None Present (0%) Fat Layer Exposed: No TIRA, LAFFERTY ([id number]) Tendon Exposed: No Muscle Exposed: No Joint Exposed: No Bone Exposed: No Limited to Skin Breakdown Periwound Skin Texture Texture Color No Abnormalities Noted: Yes No Abnormalities Noted:  Yes Moisture Temperature / Pain No Abnormalities Noted: Yes Temperature: No Abnormality Wound Preparation Ulcer Cleansing: Rinsed/Irrigated with Saline Electronic Signature(s) Signed: 04/26/2015 4:05:06 PM By: Regan Lemming BSN, RN Entered By: Regan Lemming on 04/26/2015 13:35:05 [name] ([id number]) -------------------------------------------------------------------------------- Vitals Details Patient Name: [name] Date of Service: 04/26/2015 1:45 PM Medical Record Number: [id number] Patient Account Number: [id number] Date of Birth/Sex: 03/25/1922 (81 y.o. Female) Treating RN: Afful, RN, BSN, [place] Primary Care Physician: Pernell Dupre Other Clinician: Referring Physician: Pernell Dupre Treating Physician/Extender: Frann Rider in Treatment: 11 Vital Signs Time Taken: 13:29 Temperature (F): 97.6 Height (in): 63 Pulse (bpm): 65 Weight (lbs): 148 Respiratory Rate (breaths/min): 16 Body Mass Index (BMI): 26.2 Blood Pressure (mmHg): 115/72 Reference Range: 80 - 120 mg / dl Electronic Signature(s) Signed: 04/26/2015 4:05:06 PM By: Regan Lemming BSN, RN Entered By: Regan Lemming on 04/26/2015 13:30:09

## 2015-04-27 NOTE — Progress Notes (Signed)
[name], [name] ([id number]) Visit Report for 04/26/2015 Chief Complaint Document Details Patient Name: [name]. Date of Service: 04/26/2015 1:45 PM Medical Record Number: [id number] Patient Account Number: [id number] Date of Birth/Sex: Oct 25, 1922 (81 y.o. Female) Treating RN: Primary Care Physician: Pernell Dupre Other Clinician: Referring Physician: Pernell Dupre Treating Physician/Extender: Frann Rider in Treatment: 11 Information Obtained from: Patient Chief Complaint Patient returns to the wound care center for new ulcer to: left lower extremity lateral part of the calf. This has been there for about a month. About 4 days ago she injured her right calf area when she was applying some ointment and she may have scratched herself with a nail. Electronic Signature(s) Signed: 04/26/2015 4:41:50 PM By: Christin Fudge MD, FACS Entered By: Christin Fudge on 04/26/2015 13:51:46 [name] ([id number]) -------------------------------------------------------------------------------- HPI Details Patient Name: [name] Date of Service: 04/26/2015 1:45 PM Medical Record Number: [id number] Patient Account Number: [id number] Date of Birth/Sex: [birth year] (81 y.o. Female) Treating RN: Primary Care Physician: Pernell Dupre Other Clinician: Referring Physician: Pernell Dupre Treating Physician/Extender: Frann Rider in Treatment: 11 History of Present Illness HPI Description: The patient is a pleasant 81 year old with a past medical history significant for aortic valve replacement (on Coumadin), pacemaker, CHF, abdominal aortic aneurysm, and CVA. No history of diabetes or peripheral vascular disease. She underwent excision of a squamous cell cancer on her right upper arm and left dorsal foot in early August 2015 at the [facility]. Shave biopsy on May 11, 2014 showed "superficially invasive squamous cell carcinoma". She  subsequently underwent wide excision with clear margins. She presented to the wound clinic with full thickness open surgical wounds. No symptoms to suggest ischemic rest pain or claudication. ABI 1.1. Cultures of L foot on August 09, 2014 grew ORSA sensitive to tigecycline. She has completed a course of doxycycline. 02/06/2015. -- This time around she had multiple furuncles over her left lower extremity and some on her right too. Was treated by her primary care physician with doxycycline and also was treated for shingles. Most of these ulcerations had healed primarily but the one on her left lower extremity lateral part is open and has continued to drain for about a month. She has minimal pain over this area. She has been using some local ointment and a dressing has been put over this. Last year she had a full vascular workup at [facility] in [place] and saw Dr. Donnetta Hutching for an opinion regarding arterial insufficiency. though she had significant arterial insufficiency a major surgical procedure was not recommended because of her age. Conservative therapy was recommended. The patient has had some vascular vein surgery done about 6 years ago in this hospital. 03/26/2015 -- she now has several other issues going with a cardiac problem and has been quite worried about this. Her leg is doing fine otherwise. 04/02/2015 -- all her chest and abdominal pain turned out to be just chronic constipation and a cardiac workup was fine. She is much relieved about the news. Electronic Signature(s) Signed: 04/26/2015 4:41:50 PM By: Christin Fudge MD, FACS Entered By: Christin Fudge on 04/26/2015 13:51:55 [name] ([id number]) -------------------------------------------------------------------------------- Physical Exam Details Patient Name: [name] Date of Service: 04/26/2015 1:45 PM Medical Record Number: [id number] Patient Account Number: [id number] Date of Birth/Sex: 01/06/1922 (81 y.o.  Female) Treating RN: Primary Care Physician: Pernell Dupre Other Clinician: Referring Physician: Pernell Dupre Treating Physician/Extender: Frann Rider in Treatment: 11 Constitutional . Pulse regular. Respirations normal and  unlabored. Afebrile. . Eyes Nonicteric. Reactive to light. Ears, Nose, Mouth, and Throat Lips, teeth, and gums WNL.[name] Moist mucosa without lesions . Neck supple and nontender. No palpable supraclavicular or cervical adenopathy. Normal sized without goiter. Respiratory WNL. No retractions.. Cardiovascular Pedal Pulses WNL. No clubbing, cyanosis or edema. Musculoskeletal Adexa without tenderness or enlargement.. Digits and nails w/o clubbing, cyanosis, infection, petechiae, ischemia, or inflammatory conditions.. Integumentary (Hair, Skin) the right lower extremity is completely healed. The left lower extremity has some area of reaction to her dressing and hence there is mild amount of redness around the wound. The wound itself looks very clean and granulating nicely.[name] No crepitus or fluctuance. No peri-wound warmth or erythema. No masses.[name] Psychiatric Judgement and insight Intact.. No evidence of depression, anxiety, or agitation.. Electronic Signature(s) Signed: 04/26/2015 4:41:50 PM By: Christin Fudge MD, FACS Entered By: Christin Fudge on 04/26/2015 13:52:58 [name] ([id number]) -------------------------------------------------------------------------------- Physician Orders Details Patient Name: [name] Date of Service: 04/26/2015 1:45 PM Medical Record Number: [id number] Patient Account Number: [id number] Date of Birth/Sex: [birth year] (81 y.o. Female) Treating RN: Montey Hora Primary Care Physician: Pernell Dupre Other Clinician: Referring Physician: Pernell Dupre Treating Physician/Extender: Frann Rider in Treatment: 72 Verbal / Phone Orders: Yes Clinician: Montey Hora Read Back and Verified:  Yes Diagnosis Coding Wound Cleansing Wound #3 Left,Posterior Lower Leg o Clean wound with Normal Saline. o Cleanse wound with mild soap and water o May Shower, gently pat wound dry prior to applying new dressing. o May shower with protection. Anesthetic Wound #3 Left,Posterior Lower Leg o Topical Lidocaine 4% cream applied to wound bed prior to debridement Primary Wound Dressing Wound #3 Left,Posterior Lower Leg o Contact layer Secondary Dressing Wound #3 Left,Posterior Lower Leg o Dry Gauze o Tegaderm Dressing Change Frequency Wound #3 Left,Posterior Lower Leg o Change dressing every week Follow-up Appointments Wound #3 Left,Posterior Lower Leg o Return Appointment in 1 week. Electronic Signature(s) Signed: 04/26/2015 4:41:50 PM By: Christin Fudge MD, FACS Signed: 04/26/2015 5:03:06 PM By: Montey Hora Entered By: Montey Hora on 04/26/2015 13:51:52 [name], [name] ([id number], [name] ([id number]) -------------------------------------------------------------------------------- Problem List Details Patient Name: [name], [name] Date of Service: 04/26/2015 1:45 PM Medical Record Number: [id number] Patient Account Number: [id number] Date of Birth/Sex: 09-05-1922 (81 y.o. 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/06/2015 Yes exposed I70.248 Atherosclerosis of native arteries of left leg with ulceration 02/06/2015 Yes of other part of lower left leg S81.811A Laceration without foreign body, right lower leg, initial 04/09/2015 Yes encounter Inactive Problems Resolved Problems ICD-10 Code Description Active Date Resolved Date L02.426 Furuncle of left lower limb  02/06/2015 02/06/2015 L02.425 Furuncle of right lower limb 02/06/2015 02/06/2015 Electronic Signature(s) Signed: 04/26/2015 4:41:50 PM By: Christin Fudge MD, FACS Entered By: Christin Fudge on 04/26/2015 13:51:39 [name] ([id number]) -------------------------------------------------------------------------------- Progress Note Details Patient Name: [name] Date of Service: 04/26/2015 1:45 PM Medical Record Number: [id number] Patient Account Number: [id number] Date of Birth/Sex: 17-May-1922 (81 y.o. Female) Treating RN: Primary Care Physician: Pernell Dupre Other Clinician: Referring Physician: Pernell Dupre Treating Physician/Extender: Frann Rider in Treatment: 11 Subjective Chief Complaint Information obtained from Patient Patient returns to the wound care center for new ulcer to: left lower extremity lateral part of the calf. This has been there for about a  month. About 4 days ago she injured her right calf area when she was applying some ointment and she may have scratched herself with a nail. History of Present Illness (HPI) The patient is a pleasant 81 year old with a past medical history significant for aortic valve replacement (on Coumadin), pacemaker, CHF, abdominal aortic aneurysm, and CVA. No history of diabetes or peripheral vascular disease. She underwent excision of a squamous cell cancer on her right upper arm and left dorsal foot in early August 2015 at the [facility]. Shave biopsy on May 11, 2014 showed "superficially invasive squamous cell carcinoma". She subsequently underwent wide excision with clear margins. She presented to the wound clinic with full thickness open surgical wounds. No symptoms to suggest ischemic rest pain or claudication. ABI 1.1. Cultures of L foot on August 09, 2014 grew ORSA sensitive to tigecycline. She has completed a course of doxycycline. 02/06/2015. -- This time around she had multiple  furuncles over her left lower extremity and some on her right too. Was treated by her primary care physician with doxycycline and also was treated for shingles. Most of these ulcerations had healed primarily but the one on her left lower extremity lateral part is open and has continued to drain for about a month. She has minimal pain over this area. She has been using some local ointment and a dressing has been put over this. Last year she had a full vascular workup at [facility] in [place] and saw Dr. Donnetta Hutching for an opinion regarding arterial insufficiency. though she had significant arterial insufficiency a major surgical procedure was not recommended because of her age. Conservative therapy was recommended. The patient has had some vascular vein surgery done about 6 years ago in this hospital. 03/26/2015 -- she now has several other issues going with a cardiac problem and has been quite worried about this. Her leg is doing fine otherwise. 04/02/2015 -- all her chest and abdominal pain turned out to be just chronic constipation and a cardiac workup was fine. She is much relieved about the news. [name], [name] ([id number]) Objective Constitutional Pulse regular. Respirations normal and unlabored. Afebrile. Vitals Time Taken: 1:29 PM, Height: 63 in, Weight: 148 lbs, BMI: 26.2, Temperature: 97.6 F, Pulse: 65 bpm, Respiratory Rate: 16 breaths/min, Blood Pressure: 115/72 mmHg. Eyes Nonicteric. Reactive to light. Ears, Nose, Mouth, and Throat Lips, teeth, and gums WNL.[name] Moist mucosa without lesions . Neck supple and nontender. No palpable supraclavicular or cervical adenopathy. Normal sized without goiter. Respiratory WNL. No retractions.. Cardiovascular Pedal Pulses WNL. No clubbing, cyanosis or edema. Musculoskeletal Adexa without tenderness or enlargement.. Digits and nails w/o clubbing, cyanosis, infection, petechiae, ischemia, or inflammatory  conditions.[name] Psychiatric Judgement and insight Intact.. No evidence of depression, anxiety, or agitation.. Integumentary (Hair, Skin) the right lower extremity is completely healed. The left lower extremity has some area of reaction to her dressing and hence there is mild amount of redness around the wound. The wound itself looks very clean and granulating nicely.[name] No crepitus or fluctuance. No peri-wound warmth or erythema. No masses.. Wound #3 status is Open. Original cause of wound was Gradually Appeared. The wound is located on the Left,Posterior Lower Leg. The wound measures 0.5cm length x 0.5cm width x 0.1cm depth; 0.196cm^2 area and 0.02cm^3 volume. The wound is limited to skin breakdown. There is no tunneling or undermining noted. There is a small amount of serosanguineous drainage noted. The wound margin is distinct with the outline attached to the wound base. There  is large (67-100%) red granulation within the wound bed. There is no necrotic tissue within the wound bed. The periwound skin appearance exhibited: Moist. The periwound skin appearance did not exhibit: Callus, Crepitus, Excoriation, Fluctuance, Friable, Induration, Localized Edema, Rash, Scarring, Dry/Scaly, Maceration, Atrophie Blanche, Cyanosis, Ecchymosis, Hemosiderin Staining, Mottled, Pallor, Rubor, Erythema. Periwound temperature was noted as No Abnormality. [name], [name] ([id number]) Wound #4 status is Healed - Epithelialized. Original cause of wound was Trauma. The wound is located on the Right,Posterior Lower Leg. The wound measures 0cm length x 0cm width x 0cm depth; 0cm^2 area and 0cm^3 volume. The wound is limited to skin breakdown. There is no tunneling or undermining noted. There is a small amount of serosanguineous drainage noted. The wound margin is distinct with the outline attached to the wound base. There is large (67-100%) red granulation within the wound bed. There is no necrotic tissue within the  wound bed. The periwound skin appearance had no abnormalities noted for texture. The periwound skin appearance had no abnormalities noted for moisture. The periwound skin appearance had no abnormalities noted for color. Periwound temperature was noted as No Abnormality. The right lower extremity is completely healed. The left lower extremity has some area of reaction to her dressing and hence there is mild amount of redness around the wound. The wound itself looks very clean and granulating nicely. Assessment Active Problems ICD-10 L97.222 - Non-pressure chronic ulcer of left calf with fat layer exposed I70.248 - Atherosclerosis of native arteries of left leg with ulceration of other part of lower left leg S81.811A - Laceration without foreign body, right lower leg, initial encounter She has done very well but we will change the dressing around a bit so that we use Mepitel and a piece of gauze to bolster this and then covered with a Tegaderm. She is urged not to change the dressing all week if possible and come back and see as next week. Plan Wound Cleansing: Wound #3 Left,Posterior Lower Leg: Clean wound with Normal Saline. Cleanse wound with mild soap and water May Shower, gently pat wound dry prior to applying new dressing. May shower with protection. Anesthetic: Wound #3 Left,Posterior Lower Leg: Topical Lidocaine 4% cream applied to wound bed prior to debridement Primary Wound Dressing: [name], [name] ([id number]) Wound #3 Left,Posterior Lower Leg: Contact layer Secondary Dressing: Wound #3 Left,Posterior Lower Leg: Dry Gauze Tegaderm Dressing Change Frequency: Wound #3 Left,Posterior Lower Leg: Change dressing every week Follow-up Appointments: Wound #3 Left,Posterior Lower Leg: Return Appointment in 1 week. She has done very well but we will change the dressing around a bit so that we use Mepitel and a piece of gauze to bolster this and then covered with a  Tegaderm. She is urged not to change the dressing all week if possible and come back and see as next week. Electronic Signature(s) Signed: 04/26/2015 4:41:50 PM By: Christin Fudge MD, FACS Entered By: Christin Fudge on 04/26/2015 13:54:19 [name] ([id number]) -------------------------------------------------------------------------------- SuperBill Details Patient Name: [name] Date of Service: 04/26/2015 Medical Record Number: [id number] Patient Account Number: [id number] Date of Birth/Sex: [birth year] (81 y.o. Female) Treating RN: Primary Care Physician: Pernell Dupre Other Clinician: Referring Physician: Pernell Dupre Treating Physician/Extender: Frann Rider in Treatment: 11 Diagnosis Coding ICD-10 Codes Code Description [phone] Non-pressure chronic ulcer of left calf with fat layer exposed I70.248 Atherosclerosis of native arteries of left leg with ulceration of other part of lower left leg S81.811A Laceration without foreign body, right lower leg, initial encounter  Facility Procedures CPT4 Code: [id number] Description: 72761 - WOUND CARE VISIT-LEV 3 EST PT Modifier: Quantity: 1 Physician Procedures CPT4: Description Modifier Quantity Code [id number] - WC PHYS LEVEL 3 - EST PT 1 ICD-10 Description Diagnosis L97.222 Non-pressure chronic ulcer of left calf with fat layer exposed I70.248 Atherosclerosis of native arteries of left leg with ulceration  of other part of lower left leg Electronic Signature(s) Signed: 04/26/2015 4:41:50 PM By: Christin Fudge MD, FACS Entered By: Christin Fudge on 04/26/2015 13:54:37

## 2015-05-02 ENCOUNTER — Ambulatory Visit (INDEPENDENT_AMBULATORY_CARE_PROVIDER_SITE_OTHER): Payer: Medicare Other

## 2015-05-02 DIAGNOSIS — Z954 Presence of other heart-valve replacement: Secondary | ICD-10-CM

## 2015-05-02 DIAGNOSIS — Z5181 Encounter for therapeutic drug level monitoring: Secondary | ICD-10-CM | POA: Diagnosis not present

## 2015-05-02 DIAGNOSIS — G459 Transient cerebral ischemic attack, unspecified: Secondary | ICD-10-CM | POA: Diagnosis not present

## 2015-05-02 DIAGNOSIS — I4891 Unspecified atrial fibrillation: Secondary | ICD-10-CM

## 2015-05-02 DIAGNOSIS — I359 Nonrheumatic aortic valve disorder, unspecified: Secondary | ICD-10-CM

## 2015-05-02 DIAGNOSIS — Z953 Presence of xenogenic heart valve: Secondary | ICD-10-CM

## 2015-05-02 LAB — POCT INR: INR: 2.6

## 2015-05-03 ENCOUNTER — Encounter: Payer: Medicare Other | Admitting: Surgery

## 2015-05-03 DIAGNOSIS — L97222 Non-pressure chronic ulcer of left calf with fat layer exposed: Secondary | ICD-10-CM | POA: Diagnosis not present

## 2015-05-04 NOTE — Progress Notes (Signed)
SIMONNE, BOULOS ([id number]) Visit Report for 05/03/2015 Chief Complaint Document Details Patient Name: [name], [name]. Date of Service: 05/03/2015 1:00 PM Medical Record Number: [id number] Patient Account Number: [id number] Date of Birth/Sex: October 10, 1922 (81 y.o. Female) Treating RN: Primary Care Physician: Pernell Dupre Other Clinician: Referring Physician: Pernell Dupre Treating Physician/Extender: Frann Rider in Treatment: 12 Information Obtained from: Patient Chief Complaint Patient returns to the wound care center for new ulcer to: left lower extremity lateral part of the calf. This has been there for about a month. About 4 days ago she injured her right calf area when she was applying some ointment and she may have scratched herself with a nail. Electronic Signature(s) Signed: 05/03/2015 4:07:07 PM By: Christin Fudge MD, FACS Entered By: Christin Fudge on 05/03/2015 13:39:00 [name] ([id number]) -------------------------------------------------------------------------------- HPI Details Patient Name: [name] Date of Service: 05/03/2015 1:00 PM Medical Record Number: [id number] Patient Account Number: [id number] Date of Birth/Sex: [birth year] (81 y.o. Female) Treating RN: Primary Care Physician: Pernell Dupre Other Clinician: Referring Physician: Pernell Dupre Treating Physician/Extender: Frann Rider in Treatment: 12 History of Present Illness HPI Description: The patient is a pleasant 81 year old with a past medical history significant for aortic valve replacement (on Coumadin), pacemaker, CHF, abdominal aortic aneurysm, and CVA. No history of diabetes or peripheral vascular disease. She underwent excision of a squamous cell cancer on her right upper arm and left dorsal foot in early August 2015 at the [facility]. Shave biopsy on May 11, 2014 showed "superficially invasive squamous cell carcinoma".  She subsequently underwent wide excision with clear margins. She presented to the wound clinic with full thickness open surgical wounds. No symptoms to suggest ischemic rest pain or claudication. ABI 1.1. Cultures of L foot on August 09, 2014 grew ORSA sensitive to tigecycline. She has completed a course of doxycycline. 02/06/2015. -- This time around she had multiple furuncles over her left lower extremity and some on her right too. Was treated by her primary care physician with doxycycline and also was treated for shingles. Most of these ulcerations had healed primarily but the one on her left lower extremity lateral part is open and has continued to drain for about a month. She has minimal pain over this area. She has been using some local ointment and a dressing has been put over this. Last year she had a full vascular workup at [facility] in [place] and saw Dr. Donnetta Hutching for an opinion regarding arterial insufficiency. though she had significant arterial insufficiency a major surgical procedure was not recommended because of her age. Conservative therapy was recommended. The patient has had some vascular vein surgery done about 6 years ago in this hospital. 03/26/2015 -- she now has several other issues going with a cardiac problem and has been quite worried about this. Her leg is doing fine otherwise. 04/02/2015 -- all her chest and abdominal pain turned out to be just chronic constipation and a cardiac workup was fine. She is much relieved about the news. Electronic Signature(s) Signed: 05/03/2015 4:07:07 PM By: Christin Fudge MD, FACS Entered By: Christin Fudge on 05/03/2015 13:39:05 [name] ([id number]) -------------------------------------------------------------------------------- Physical Exam Details Patient Name: [name] Date of Service: 05/03/2015 1:00 PM Medical Record Number: [id number] Patient Account Number: [id number] Date of Birth/Sex: 11/08/1922 (81  y.o. Female) Treating RN: Primary Care Physician: Pernell Dupre Other Clinician: Referring Physician: Pernell Dupre Treating Physician/Extender: Frann Rider in Treatment: 12 Constitutional . Pulse regular. Respirations normal and  unlabored. Afebrile. . Eyes Nonicteric. Reactive to light. Ears, Nose, Mouth, and Throat Lips, teeth, and gums WNL.[name] Moist mucosa without lesions . Neck supple and nontender. No palpable supraclavicular or cervical adenopathy. Normal sized without goiter. Respiratory WNL. No retractions.. Cardiovascular Pedal Pulses WNL. No clubbing, cyanosis or edema. Musculoskeletal Adexa without tenderness or enlargement.. Digits and nails w/o clubbing, cyanosis, infection, petechiae, ischemia, or inflammatory conditions.. Integumentary (Hair, Skin) No suspicious lesions.she had a bit of eschar over this left lower extremity but once it was removed the wound is completely healed.. No crepitus or fluctuance. No peri-wound warmth or erythema. No masses.[name] Psychiatric Judgement and insight Intact.. No evidence of depression, anxiety, or agitation.. Electronic Signature(s) Signed: 05/03/2015 4:07:07 PM By: Christin Fudge MD, FACS Entered By: Christin Fudge on 05/03/2015 13:39:48 [name] ([id number]) -------------------------------------------------------------------------------- Physician Orders Details Patient Name: [name] Date of Service: 05/03/2015 1:00 PM Medical Record Number: [id number] Patient Account Number: [id number] Date of Birth/Sex: December 09, 1921 (81 y.o. Female) Treating RN: Montey Hora Primary Care Physician: Pernell Dupre Other Clinician: Referring Physician: Pernell Dupre Treating Physician/Extender: Frann Rider in Treatment: 12 Verbal / Phone Orders: Yes Clinician: Montey Hora Read Back and Verified: Yes Diagnosis Coding Discharge From [facility] o Discharge from [place] Signature(s) Signed: 05/03/2015 4:07:07 PM By: Christin Fudge MD, FACS Signed: 05/03/2015 5:00:03 PM By: Montey Hora Entered By: Montey Hora on 05/03/2015 13:41:00 [name] ([id number]) -------------------------------------------------------------------------------- Problem List Details Patient Name: DAURICE, OVANDO Date of Service: 05/03/2015 1:00 PM Medical Record Number: [id number] Patient Account Number: [id number] Date of Birth/Sex: 07-03-1922 (81 y.o. 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/06/2015 Yes exposed I70.248 Atherosclerosis of native arteries of left leg with ulceration 02/06/2015 Yes of other part of lower left leg S81.811A Laceration without foreign body, right lower leg, initial 04/09/2015 Yes encounter Inactive Problems Resolved Problems ICD-10 Code Description Active Date Resolved Date L02.426 Furuncle of left lower limb 02/06/2015 02/06/2015 L02.425 Furuncle of right lower limb 02/06/2015 02/06/2015 Electronic Signature(s) Signed: 05/03/2015 4:07:07 PM By: Christin Fudge MD, FACS Entered By: Christin Fudge on 05/03/2015 13:38:51 [name] ([id number]) -------------------------------------------------------------------------------- Progress Note Details Patient Name: [name] Date of Service: 05/03/2015 1:00 PM Medical Record Number: [id number] Patient Account Number: [id number] Date of Birth/Sex: 05-Mar-1922 (81 y.o. Female) Treating RN: Primary Care Physician: Pernell Dupre Other Clinician: Referring Physician: Pernell Dupre Treating Physician/Extender: Frann Rider in Treatment: 12 Subjective Chief  Complaint Information obtained from Patient Patient returns to the wound care center for new ulcer to: left lower extremity lateral part of the calf. This has been there for about a month. About 4 days ago she injured her right calf area when she was applying some ointment and she may have scratched herself with a nail. History of Present Illness (HPI) The patient is a pleasant 81 year old with a past medical history significant for aortic valve replacement (on Coumadin), pacemaker, CHF, abdominal aortic aneurysm, and CVA. No history of diabetes or peripheral vascular disease. She underwent excision of a squamous cell cancer on her right upper arm and left dorsal foot in early August 2015 at the [facility]. Shave biopsy on May 11, 2014 showed "superficially invasive squamous cell carcinoma". She subsequently underwent wide excision with clear margins. She presented to  the wound clinic with full thickness open surgical wounds. No symptoms to suggest ischemic rest pain or claudication. ABI 1.1. Cultures of L foot on August 09, 2014 grew ORSA sensitive to tigecycline. She has completed a course of doxycycline. 02/06/2015. -- This time around she had multiple furuncles over her left lower extremity and some on her right too. Was treated by her primary care physician with doxycycline and also was treated for shingles. Most of these ulcerations had healed primarily but the one on her left lower extremity lateral part is open and has continued to drain for about a month. She has minimal pain over this area. She has been using some local ointment and a dressing has been put over this. Last year she had a full vascular workup at [facility] in [place] and saw Dr. Donnetta Hutching for an opinion regarding arterial insufficiency. though she had significant arterial insufficiency a major surgical procedure was not recommended because of her age. Conservative therapy was recommended. The  patient has had some vascular vein surgery done about 6 years ago in this hospital. 03/26/2015 -- she now has several other issues going with a cardiac problem and has been quite worried about this. Her leg is doing fine otherwise. 04/02/2015 -- all her chest and abdominal pain turned out to be just chronic constipation and a cardiac workup was fine. She is much relieved about the news. GEORGANN, BRAMBLE ([id number]) Objective Constitutional Pulse regular. Respirations normal and unlabored. Afebrile. Vitals Time Taken: 1:26 PM, Height: 63 in, Weight: 148 lbs, BMI: 26.2, Temperature: 97.9 F, Pulse: 62 bpm, Respiratory Rate: 16 breaths/min, Blood Pressure: 141/51 mmHg. Eyes Nonicteric. Reactive to light. Ears, Nose, Mouth, and Throat Lips, teeth, and gums WNL.[name] Moist mucosa without lesions . Neck supple and nontender. No palpable supraclavicular or cervical adenopathy. Normal sized without goiter. Respiratory WNL. No retractions.. Cardiovascular Pedal Pulses WNL. No clubbing, cyanosis or edema. Musculoskeletal Adexa without tenderness or enlargement.. Digits and nails w/o clubbing, cyanosis, infection, petechiae, ischemia, or inflammatory conditions.[name] Psychiatric Judgement and insight Intact.. No evidence of depression, anxiety, or agitation.. Integumentary (Hair, Skin) No suspicious lesions.she had a bit of eschar over this left lower extremity but once it was removed the wound is completely healed.. No crepitus or fluctuance. No peri-wound warmth or erythema. No masses.. Wound #3 status is Healed - Epithelialized. Original cause of wound was Gradually Appeared. The wound is located on the Left,Posterior Lower Leg. The wound measures 0cm length x 0cm width x 0cm depth; 0cm^2 area and 0cm^3 volume. The wound is limited to skin breakdown. There is no tunneling or undermining noted. There is a small amount of serosanguineous drainage noted. The wound margin is distinct with the  outline attached to the wound base. There is large (67-100%) red granulation within the wound bed. There is no necrotic tissue within the wound bed. The periwound skin appearance exhibited: Moist. The periwound skin appearance did not exhibit: Callus, Crepitus, Excoriation, Fluctuance, Friable, Induration, Localized Edema, Rash, Scarring, Dry/Scaly, Maceration, Atrophie Blanche, Cyanosis, Ecchymosis, Hemosiderin Staining, Mottled, Pallor, Rubor, Erythema. Periwound temperature was noted as RANIYA, GOLEMBESKI. ([id number]) No Abnormality. Assessment Active Problems ICD-10 L97.222 - Non-pressure chronic ulcer of left calf with fat layer exposed I70.248 - Atherosclerosis of native arteries of left leg with ulceration of other part of lower left leg S81.811A - Laceration without foreign body, right lower leg, initial encounter Wound is healed completely and I will discharge her from wound care services and see her back as needed. Plan  Discharge From [facility] Services: Discharge from [facility] is healed completely and I will discharge her from wound care services and see her back as needed. Electronic Signature(s) Signed: 05/03/2015 4:11:14 PM By: Christin Fudge MD, FACS Previous Signature: 05/03/2015 4:07:07 PM Version By: Christin Fudge MD, FACS Entered By: Christin Fudge on 05/03/2015 16:08:50 [name] ([id number]) -------------------------------------------------------------------------------- SuperBill Details Patient Name: [name] Date of Service: 05/03/2015 Medical Record Number: [id number] Patient Account Number: [id number] Date of Birth/Sex: 09-Apr-1922 (81 y.o. Female) Treating RN: Primary Care Physician: Pernell Dupre Other Clinician: Referring Physician: Pernell Dupre Treating Physician/Extender: Frann Rider in Treatment: 12 Diagnosis Coding ICD-10 Codes Code Description [phone] Non-pressure chronic ulcer of left calf with fat layer  exposed I70.248 Atherosclerosis of native arteries of left leg with ulceration of other part of lower left leg S81.811A Laceration without foreign body, right lower leg, initial encounter Facility Procedures CPT4 Code: [id number] Description: [phone] - WOUND CARE VISIT-LEV 2 EST PT Modifier: Quantity: 1 Physician Procedures CPT4: Description Modifier Quantity Code [id number] - WC PHYS LEVEL 3 - EST PT 1 ICD-10 Description Diagnosis L97.222 Non-pressure chronic ulcer of left calf with fat layer exposed I70.248 Atherosclerosis of native arteries of left leg with ulceration  of other part of lower left leg S81.811A Laceration without foreign body, right lower leg, initial encounter Electronic Signature(s) Signed: 05/03/2015 4:07:07 PM By: Christin Fudge MD, FACS Entered By: Christin Fudge on 05/03/2015 13:40:41

## 2015-05-04 NOTE — Progress Notes (Signed)
[name] ([id number]) Visit Report for 05/03/2015 Arrival Information Details Patient Name: [name] Date of Service: 05/03/2015 1:00 PM Medical Record Number: [id number] Patient Account Number: [id number] Date of Birth/Sex: 1922-09-19 (81 y.o. Female) Treating RN: Montey Hora Primary Care Physician: Pernell Dupre Other Clinician: Referring Physician: Pernell Dupre Treating Physician/Extender: Frann Rider in Treatment: 12 Visit Information History Since Last Visit Added or deleted any medications: No Patient Arrived: Wheel Chair Any new allergies or adverse reactions: No Arrival Time: 13:23 Had a fall or experienced change in No Accompanied By: granddaughter activities of daily living that may affect Transfer Assistance: None risk of falls: Patient Identification Verified: Yes Signs or symptoms of abuse/neglect since last No Secondary Verification Yes visito Process Completed: Hospitalized since last visit: No Patient Requires No Pain Present Now: No Transmission-Based Precautions: Patient Has Alerts: Yes Patient Alerts: Patient on Blood Thinner ABI,L:1.10,R:1.10 Electronic Signature(s) Signed: 05/03/2015 5:00:03 PM By: Montey Hora Entered By: Montey Hora on 05/03/2015 13:24:16 [name] ([id number]) -------------------------------------------------------------------------------- Clinic Level of Care Assessment Details Patient Name: [name] Date of Service: 05/03/2015 1:00 PM Medical Record Number: [id number] Patient Account Number: [id number] Date of Birth/Sex: 1922-08-21 (81 y.o. Female) Treating RN: Montey Hora Primary Care Physician: Pernell Dupre Other Clinician: Referring Physician: Pernell Dupre Treating Physician/Extender: Frann Rider in Treatment: 12 Clinic Level of Care Assessment Items TOOL 4 Quantity Score []  - Use when only an EandM is performed on FOLLOW-UP visit  0 ASSESSMENTS - Nursing Assessment / Reassessment X - Reassessment of Co-morbidities (includes updates in patient status) 1 10 X - Reassessment of Adherence to Treatment Plan 1 5 ASSESSMENTS - Wound and Skin Assessment / Reassessment X - Simple Wound Assessment / Reassessment - one wound 1 5 []  - Complex Wound Assessment / Reassessment - multiple wounds 0 []  - Dermatologic / Skin Assessment (not related to wound area) 0 ASSESSMENTS - Focused Assessment X - Circumferential Edema Measurements - multi extremities 1 5 []  - Nutritional Assessment / Counseling / Intervention 0 X - Lower Extremity Assessment (monofilament, tuning fork, pulses) 1 5 []  - Peripheral Arterial Disease Assessment (using hand held doppler) 0 ASSESSMENTS - Ostomy and/or Continence Assessment and Care []  - Incontinence Assessment and Management 0 []  - Ostomy Care Assessment and Management (repouching, etc.) 0 PROCESS - Coordination of Care X - Simple Patient / Family Education for ongoing care 1 15 []  - Complex (extensive) Patient / Family Education for ongoing care 0 []  - Staff obtains Programmer, systems, Records, Test Results / Process Orders 0 []  - Staff telephones HHA, Nursing Homes / Clarify orders / etc 0 []  - Routine Transfer to another Facility (non-emergent condition) 0 [name] ([id number]) []  - Routine Hospital Admission (non-emergent condition) 0 []  - New Admissions / Biomedical engineer / Ordering NPWT, Apligraf, etc. 0 []  - Emergency Hospital Admission (emergent condition) 0 X - Simple Discharge Coordination 1 10 []  - Complex (extensive) Discharge Coordination 0 PROCESS - Special Needs []  - Pediatric / Minor Patient Management 0 []  - Isolation Patient Management 0 []  - Hearing / Language / Visual special needs 0 []  - Assessment of Community assistance (transportation, D/C planning, etc.) 0 []  - Additional assistance / Altered mentation 0 []  - Support Surface(s) Assessment (bed, cushion, seat,  etc.) 0 INTERVENTIONS - Wound Cleansing / Measurement X - Simple Wound Cleansing - one wound 1 5 []  - Complex Wound Cleansing - multiple wounds 0 X - Wound Imaging (photographs - any number of wounds) 1 5 []  -  Wound Tracing (instead of photographs) 0 X - Simple Wound Measurement - one wound 1 5 []  - Complex Wound Measurement - multiple wounds 0 INTERVENTIONS - Wound Dressings []  - Small Wound Dressing one or multiple wounds 0 []  - Medium Wound Dressing one or multiple wounds 0 []  - Large Wound Dressing one or multiple wounds 0 []  - Application of Medications - topical 0 []  - Application of Medications - injection 0 INTERVENTIONS - Miscellaneous []  - External ear exam 0 [name] ([id number]) []  - Specimen Collection (cultures, biopsies, blood, body fluids, etc.) 0 []  - Specimen(s) / Culture(s) sent or taken to Lab for analysis 0 []  - Patient Transfer (multiple staff / Harrel Lemon Lift / Similar devices) 0 []  - Simple Staple / Suture removal (25 or less) 0 []  - Complex Staple / Suture removal (26 or more) 0 []  - Hypo / Hyperglycemic Management (close monitor of Blood Glucose) 0 []  - Ankle / Brachial Index (ABI) - do not check if billed separately 0 X - Vital Signs 1 5 Has the patient been seen at the hospital within the last three years: Yes Total Score: 75 Level Of Care: New/Established - Level 2 Electronic Signature(s) Signed: 05/03/2015 5:00:03 PM By: Montey Hora Entered By: Montey Hora on 05/03/2015 13:40:19 [name] ([id number]) -------------------------------------------------------------------------------- Encounter Discharge Information Details Patient Name: [name] Date of Service: 05/03/2015 1:00 PM Medical Record Number: [id number] Patient Account Number: [id number] Date of Birth/Sex: 27-Jun-1922 (81 y.o. Female) Treating RN: Primary Care Physician: Pernell Dupre Other Clinician: Referring Physician: Pernell Dupre Treating  Physician/Extender: Frann Rider in Treatment: 12 Encounter Discharge Information Items Discharge Pain Level: 0 Discharge Condition: Stable Ambulatory Status: Wheelchair Discharge Destination: Home Transportation: Private Auto Accompanied By: granddaughter Schedule Follow-up Appointment: No Medication Reconciliation completed and provided to No Patient/Care Blakely Gluth: Provided on Clinical Summary of Care: 05/03/2015 Form Type Recipient Paper Patient JW Electronic Signature(s) Signed: 05/03/2015 5:00:03 PM By: Montey Hora Previous Signature: 05/03/2015 1:39:36 PM Version By: Ruthine Dose Entered By: Montey Hora on 05/03/2015 13:41:22 [name] ([id number]) -------------------------------------------------------------------------------- Lower Extremity Assessment Details Patient Name: [name] Date of Service: 05/03/2015 1:00 PM Medical Record Number: [id number] Patient Account Number: [id number] Date of Birth/Sex: [birth year] (81 y.o. Female) Treating RN: Montey Hora Primary Care Physician: Pernell Dupre Other Clinician: Referring Physician: Pernell Dupre Treating Physician/Extender: Frann Rider in Treatment: 12 Edema Assessment Assessed: [Left: No] [Right: No] E[Left: dema] [Right: :] Calf Left: Right: Point of Measurement: 26 cm From Medial Instep 33 cm cm Ankle Left: Right: Point of Measurement: 8 cm From Medial Instep 21 cm cm Vascular Assessment Pulses: Posterior Tibial Palpable: [Left:Yes] Dorsalis Pedis Palpable: [Left:Yes] Extremity colors, hair growth, and conditions: Extremity Color: [Left:Hyperpigmented] Hair Growth on Extremity: [Left:Yes] Temperature of Extremity: [Left:Warm] Capillary Refill: [Left:< 3 seconds] Electronic Signature(s) Signed: 05/03/2015 5:00:03 PM By: Montey Hora Entered By: Montey Hora on 05/03/2015 13:30:48 [name]  ([id number]) -------------------------------------------------------------------------------- Multi Wound Chart Details Patient Name: [name] Date of Service: 05/03/2015 1:00 PM Medical Record Number: [id number] Patient Account Number: [id number] Date of Birth/Sex: 1922-11-11 (81 y.o. Female) Treating RN: Montey Hora Primary Care Physician: Pernell Dupre Other Clinician: Referring Physician: Pernell Dupre Treating Physician/Extender: Frann Rider in Treatment: 12 Vital Signs Height(in): 63 Pulse(bpm): 62 Weight(lbs): 148 Blood Pressure 141/51 (mmHg): Body Mass Index(BMI): 26 Temperature(F): 97.9 Respiratory Rate 16 (breaths/min): Photos: [3:No Photos] [N/A:N/A] Wound Location: [3:Left Lower Leg - Posterior N/A] Wounding Event: [3:Gradually Appeared] [N/A:N/A] Primary Etiology: [3:Infection -  not elsewhere N/A classified] Comorbid History: [3:Cataracts, Angina, Arrhythmia, Congestive Heart Failure, Hypertension, Hepatitis B, Osteoarthritis, Seizure Disorder] [N/A:N/A] Date Acquired: [3:01/08/2015] [N/A:N/A] Weeks of Treatment: [3:12] [N/A:N/A] Wound Status: [3:Open] [N/A:N/A] Measurements L x W x D 0.2x0.4x0.1 [N/A:N/A] (cm) Area (cm) : [3:0.063] [N/A:N/A] Volume (cm) : [3:0.006] [N/A:N/A] % Reduction in Area: [3:98.40%] [N/A:N/A] % Reduction in Volume: 99.20% [N/A:N/A] Classification: [3:Full Thickness Without Exposed Support Structures] [N/A:N/A] Exudate Amount: [3:Small] [N/A:N/A] Exudate Type: [3:Serosanguineous] [N/A:N/A] Exudate Color: [3:red, brown] [N/A:N/A] Wound Margin: [3:Distinct, outline attached N/A] Granulation Amount: [3:Large (67-100%)] [N/A:N/A] Granulation Quality: [3:Red] [N/A:N/A] Necrotic Amount: [3:None Present (0%)] [N/A:N/A] Exposed Structures: Fascia: No N/A N/A Fat: No Tendon: No Muscle: No Joint: No Bone: No Limited to Skin Breakdown Epithelialization: Large (67-100%) N/A N/A Periwound Skin Texture:  Edema: No N/A N/A Excoriation: No Induration: No Callus: No Crepitus: No Fluctuance: No Friable: No Rash: No Scarring: No Periwound Skin Moist: Yes N/A N/A Moisture: Maceration: No Dry/Scaly: No Periwound Skin Color: Atrophie Blanche: No N/A N/A Cyanosis: No Ecchymosis: No Erythema: No Hemosiderin Staining: No Mottled: No Pallor: No Rubor: No Temperature: No Abnormality N/A N/A Tenderness on No N/A N/A Palpation: Wound Preparation: Ulcer Cleansing: N/A N/A Rinsed/Irrigated with Saline Topical Anesthetic Applied: None Treatment Notes Electronic Signature(s) Signed: 05/03/2015 5:00:03 PM By: Montey Hora Entered By: Montey Hora on 05/03/2015 13:31:20 [name] ([id number]) -------------------------------------------------------------------------------- [place] Details Patient Name: [name] Date of Service: 05/03/2015 1:00 PM Medical Record Number: [id number] Patient Account Number: [id number] Date of Birth/Sex: 1922-10-03 (81 y.o. Female) Treating RN: Montey Hora Primary Care Physician: Pernell Dupre Other Clinician: Referring Physician: Pernell Dupre Treating Physician/Extender: Frann Rider in Treatment: 12 Active Inactive Electronic Signature(s) Signed: 05/03/2015 4:35:24 PM By: Montey Hora Entered By: Montey Hora on 05/03/2015 16:35:23 [name] ([id number]) -------------------------------------------------------------------------------- Patient/Caregiver Education Details Patient Name: [name] Date of Service: 05/03/2015 1:00 PM Medical Record Number: [id number] Patient Account Number: [id number] Date of Birth/Gender: Dec 16, 1921 (81 y.o. Female) Treating RN: Montey Hora Primary Care Physician: Pernell Dupre Other Clinician: Referring Physician: Pernell Dupre Treating Physician/Extender: Frann Rider in Treatment: 12 Education Assessment Education  Provided To: Patient and Caregiver Education Topics Provided Basic Hygiene: Handouts: Other: continued skin care and ulcer prevention Methods: Explain/Verbal Responses: State content correctly Electronic Signature(s) Signed: 05/03/2015 5:00:03 PM By: Montey Hora Entered By: Montey Hora on 05/03/2015 13:41:47 [name] ([id number]) -------------------------------------------------------------------------------- Wound Assessment Details Patient Name: [name] Date of Service: 05/03/2015 1:00 PM Medical Record Number: [id number] Patient Account Number: [id number] Date of Birth/Sex: 05-Apr-1922 (81 y.o. Female) Treating RN: Montey Hora Primary Care Physician: Pernell Dupre Other Clinician: Referring Physician: Pernell Dupre Treating Physician/Extender: Frann Rider in Treatment: 12 Wound Status Wound Number: 3 Primary Infection - not elsewhere classified Etiology: Wound Location: Left, Posterior Lower Leg Wound Healed - Epithelialized Wounding Event: Gradually Appeared Status: Date Acquired: 01/08/2015 Comorbid Cataracts, Angina, Arrhythmia, Weeks Of Treatment: 12 History: Congestive Heart Failure, Hypertension, Clustered Wound: No Hepatitis B, Osteoarthritis, Seizure Disorder Photos Photo Uploaded By: Montey Hora on 05/03/2015 16:31:05 Wound Measurements Length: (cm) 0 % Reduction Width: (cm) 0 % Reduction Depth: (cm) 0 Epithelializ Area: (cm) 0 Tunneling: Volume: (cm) 0 Undermining in Area: 100% in Volume: 100% ation: Large (67-100%) No : No Wound Description Full Thickness Without Exposed Foul Odor A Classification: Support Structures Wound Margin: Distinct, outline attached Exudate Small Amount: Exudate Type: Serosanguineous Exudate Color: red, brown fter Cleansing: No Wound Bed Granulation Amount: Large (67-100%) Exposed Structure Brayboy, Chenika  Darnell Level ([id number]) Granulation Quality: Red Fascia Exposed:  No Necrotic Amount: None Present (0%) Fat Layer Exposed: No Tendon Exposed: No Muscle Exposed: No Joint Exposed: No Bone Exposed: No Limited to Skin Breakdown Periwound Skin Texture Texture Color No Abnormalities Noted: No No Abnormalities Noted: No Callus: No Atrophie Blanche: No Crepitus: No Cyanosis: No Excoriation: No Ecchymosis: No Fluctuance: No Erythema: No Friable: No Hemosiderin Staining: No Induration: No Mottled: No Localized Edema: No Pallor: No Rash: No Rubor: No Scarring: No Temperature / Pain Moisture Temperature: No Abnormality No Abnormalities Noted: No Dry / Scaly: No Maceration: No Moist: Yes Wound Preparation Ulcer Cleansing: Rinsed/Irrigated with Saline Topical Anesthetic Applied: None Electronic Signature(s) Signed: 05/03/2015 5:00:03 PM By: Montey Hora Entered By: Montey Hora on 05/03/2015 13:40:44 [name] ([id number]) -------------------------------------------------------------------------------- Vitals Details Patient Name: [name] Date of Service: 05/03/2015 1:00 PM Medical Record Number: [id number] Patient Account Number: [id number] Date of Birth/Sex: 11-Feb-1922 (81 y.o. Female) Treating RN: Montey Hora Primary Care Physician: Pernell Dupre Other Clinician: Referring Physician: Pernell Dupre Treating Physician/Extender: Frann Rider in Treatment: 12 Vital Signs Time Taken: 13:26 Temperature (F): 97.9 Height (in): 63 Pulse (bpm): 62 Weight (lbs): 148 Respiratory Rate (breaths/min): 16 Body Mass Index (BMI): 26.2 Blood Pressure (mmHg): 141/51 Reference Range: 80 - 120 mg / dl Electronic Signature(s) Signed: 05/03/2015 5:00:03 PM By: Montey Hora Entered By: Montey Hora on 05/03/2015 13:26:50

## 2015-05-14 ENCOUNTER — Other Ambulatory Visit: Payer: Self-pay

## 2015-05-24 ENCOUNTER — Encounter: Payer: Self-pay | Admitting: Internal Medicine

## 2015-05-25 ENCOUNTER — Encounter: Payer: Self-pay | Admitting: Internal Medicine

## 2015-05-30 ENCOUNTER — Ambulatory Visit (INDEPENDENT_AMBULATORY_CARE_PROVIDER_SITE_OTHER): Payer: Medicare Other | Admitting: *Deleted

## 2015-05-30 DIAGNOSIS — G459 Transient cerebral ischemic attack, unspecified: Secondary | ICD-10-CM

## 2015-05-30 DIAGNOSIS — I4891 Unspecified atrial fibrillation: Secondary | ICD-10-CM

## 2015-05-30 DIAGNOSIS — I359 Nonrheumatic aortic valve disorder, unspecified: Secondary | ICD-10-CM

## 2015-05-30 DIAGNOSIS — Z5181 Encounter for therapeutic drug level monitoring: Secondary | ICD-10-CM

## 2015-05-30 DIAGNOSIS — Z953 Presence of xenogenic heart valve: Secondary | ICD-10-CM

## 2015-05-30 DIAGNOSIS — Z954 Presence of other heart-valve replacement: Secondary | ICD-10-CM

## 2015-05-30 LAB — POCT INR: INR: 2.5

## 2015-06-09 ENCOUNTER — Emergency Department (HOSPITAL_COMMUNITY): Payer: Medicare Other

## 2015-06-09 ENCOUNTER — Emergency Department (HOSPITAL_COMMUNITY)
Admission: EM | Admit: 2015-06-09 | Discharge: 2015-06-09 | Disposition: A | Discharge status: Home / Self Care | Payer: Medicare Other | Attending: Emergency Medicine | Admitting: Emergency Medicine

## 2015-06-09 ENCOUNTER — Encounter (HOSPITAL_COMMUNITY): Payer: Self-pay | Admitting: Emergency Medicine

## 2015-06-09 DIAGNOSIS — N390 Urinary tract infection, site not specified: Secondary | ICD-10-CM | POA: Insufficient documentation

## 2015-06-09 DIAGNOSIS — M791 Myalgia, unspecified site: Secondary | ICD-10-CM

## 2015-06-09 DIAGNOSIS — Z8619 Personal history of other infectious and parasitic diseases: Secondary | ICD-10-CM | POA: Insufficient documentation

## 2015-06-09 DIAGNOSIS — I5033 Acute on chronic diastolic (congestive) heart failure: Secondary | ICD-10-CM | POA: Diagnosis not present

## 2015-06-09 DIAGNOSIS — I10 Essential (primary) hypertension: Secondary | ICD-10-CM | POA: Insufficient documentation

## 2015-06-09 DIAGNOSIS — Z85828 Personal history of other malignant neoplasm of skin: Secondary | ICD-10-CM | POA: Diagnosis not present

## 2015-06-09 DIAGNOSIS — Z880 Allergy status to penicillin: Secondary | ICD-10-CM | POA: Insufficient documentation

## 2015-06-09 DIAGNOSIS — Z7901 Long term (current) use of anticoagulants: Secondary | ICD-10-CM | POA: Diagnosis not present

## 2015-06-09 DIAGNOSIS — Z950 Presence of cardiac pacemaker: Secondary | ICD-10-CM | POA: Diagnosis not present

## 2015-06-09 DIAGNOSIS — Z91040 Latex allergy status: Secondary | ICD-10-CM | POA: Insufficient documentation

## 2015-06-09 DIAGNOSIS — Z79899 Other long term (current) drug therapy: Secondary | ICD-10-CM | POA: Diagnosis not present

## 2015-06-09 DIAGNOSIS — R011 Cardiac murmur, unspecified: Secondary | ICD-10-CM | POA: Diagnosis not present

## 2015-06-09 LAB — CBC WITH DIFFERENTIAL/PLATELET
BASOS PCT: 0 % (ref 0–1)
Basophils Absolute: 0 10*3/uL (ref 0.0–0.1)
EOS ABS: 0.1 10*3/uL (ref 0.0–0.7)
EOS PCT: 3 % (ref 0–5)
HEMATOCRIT: 40.9 % (ref 36.0–46.0)
Hemoglobin: 13.5 g/dL (ref 12.0–15.0)
LYMPHS ABS: 1.3 10*3/uL (ref 0.7–4.0)
Lymphocytes Relative: 24 % (ref 12–46)
MCH: 30.1 pg (ref 26.0–34.0)
MCHC: 33 g/dL (ref 30.0–36.0)
MCV: 91.3 fL (ref 78.0–100.0)
Monocytes Absolute: 0.6 10*3/uL (ref 0.1–1.0)
Monocytes Relative: 11 % (ref 3–12)
Neutro Abs: 3.4 10*3/uL (ref 1.7–7.7)
Neutrophils Relative %: 62 % (ref 43–77)
PLATELETS: 130 10*3/uL — AB (ref 150–400)
RBC: 4.48 MIL/uL (ref 3.87–5.11)
RDW: 14.5 % (ref 11.5–15.5)
WBC: 5.5 10*3/uL (ref 4.0–10.5)

## 2015-06-09 LAB — URINE MICROSCOPIC-ADD ON

## 2015-06-09 LAB — URINALYSIS, ROUTINE W REFLEX MICROSCOPIC
BILIRUBIN URINE: NEGATIVE
Glucose, UA: NEGATIVE mg/dL
KETONES UR: NEGATIVE mg/dL
Nitrite: NEGATIVE
PH: 7 (ref 5.0–8.0)
PROTEIN: 30 mg/dL — AB
Specific Gravity, Urine: 1.017 (ref 1.005–1.030)
Urobilinogen, UA: 2 mg/dL — ABNORMAL HIGH (ref 0.0–1.0)

## 2015-06-09 LAB — COMPREHENSIVE METABOLIC PANEL
ALT: 16 U/L (ref 14–54)
ANION GAP: 7 (ref 5–15)
AST: 21 U/L (ref 15–41)
Albumin: 3.3 g/dL — ABNORMAL LOW (ref 3.5–5.0)
Alkaline Phosphatase: 101 U/L (ref 38–126)
BILIRUBIN TOTAL: 0.9 mg/dL (ref 0.3–1.2)
BUN: 16 mg/dL (ref 6–20)
CO2: 28 mmol/L (ref 22–32)
CREATININE: 0.79 mg/dL (ref 0.44–1.00)
Calcium: 9 mg/dL (ref 8.9–10.3)
Chloride: 101 mmol/L (ref 101–111)
GFR calc Af Amer: >60 mL/min (ref >60)
GFR calc non Af Amer: >60 mL/min (ref >60)
Glucose, Bld: 114 mg/dL — ABNORMAL HIGH (ref 65–99)
POTASSIUM: 4.6 mmol/L (ref 3.5–5.1)
SODIUM: 136 mmol/L (ref 135–145)
TOTAL PROTEIN: 6.5 g/dL (ref 6.5–8.1)

## 2015-06-09 LAB — I-STAT TROPONIN, ED: TROPONIN I, POC: 0.04 ng/mL (ref 0.00–0.08)

## 2015-06-09 LAB — BRAIN NATRIURETIC PEPTIDE: B NATRIURETIC PEPTIDE 5: 365.6 pg/mL — AB (ref 0.0–100.0)

## 2015-06-09 LAB — CK: Total CK: 33 U/L — ABNORMAL LOW (ref 38–234)

## 2015-06-09 LAB — I-STAT CG4 LACTIC ACID, ED: LACTIC ACID, VENOUS: 0.99 mmol/L (ref 0.5–2.0)

## 2015-06-09 MED ORDER — CEPHALEXIN 250 MG PO CAPS: 250.0000 mg | ORAL_CAPSULE | Freq: Three times a day (TID) | ORAL | Status: DC

## 2015-06-09 MED ORDER — CEPHALEXIN 250 MG PO CAPS
250.0000 mg | ORAL_CAPSULE | Freq: Once | ORAL | Status: AC
  Administered 2015-06-09: 250 mg via ORAL
  Filled 2015-06-09: qty 1

## 2015-06-09 NOTE — ED Provider Notes (Addendum)
CSN: [id number]     Arrival date & time 06/09/15  1343 History   First MD Initiated Contact with Patient 06/09/15 1358     Chief Complaint  Patient presents with  . Muscle Pain     (Consider location/radiation/quality/duration/timing/severity/associated sxs/prior Treatment) HPI Comments: Patient is a 81 year old female with a past medical history of permanent atrial fibrillation, tachybradycardia syndrome status post pacemaker, diastolic CHF, AAA who presents today with a 5 day history of worsening muscle pain and chills. She has had 3 episodes of diarrhea since yesterday but denies any urinary frequency or urgency. She has not been around any sick contacts and denies any recent medication changes except for discontinuing torsemide approximately 1 week ago. She denies any recent chest pain, cough but has noticed shortness of breath since yesterday. She has had an approximate 2 pound weight gain since stopping the torsemide. She denies any leg swelling, joint swelling or redness, rash.  She has had some left side pain decreased appetite with mild nausea but no vomiting.  Patient is a 81 y.o. female presenting with musculoskeletal pain. The history is provided by the patient and a relative.  Muscle Pain This is a new problem. Episode onset: 5 days ago. The problem occurs constantly. The problem has been gradually worsening. Associated symptoms include shortness of breath. Pertinent negatives include no chest pain. Nothing aggravates the symptoms. The symptoms are relieved by acetaminophen. She has tried acetaminophen for the symptoms. The treatment provided no relief.    Past Medical History  Diagnosis Date  . Permanent atrial fibrillation     a. Chronic coumadin.  . Tachy-brady syndrome     a. Severe in the setting of permanent atrial fibrillation with tachybrady syndrome;  b. 08/2007 s/p MDT [place] ADSR01 DC PPM.  . S/P placement of cardiac pacemaker     a.  08/2007 s/p MDT [place]  PPM.  . Brain aneurysm     Hx of  . Hypertension   . Thoracic aneurysm     a. 10/2011 CTA: 5.0 x 3.8 cm.  [name] AAA (abdominal aortic aneurysm)     a. 12/2011 U/S: 3.3 cm prox AAA.  . S/P aortic valve replacement with bioprosthetic valve     a. [facility].  [name] Aortic insufficiency     a. S/P bioprosthetic AVR;  b. 02/2013 Echo: EF 50-55%, mild to mod AI (mean grad 29mmHg).  . Chronic diastolic CHF (congestive heart failure)     a. 02/2013 Echo: EF 50-55%.  . Diverticulosis   . Hemorrhoids   . Gastritis   . Hepatitis B 1980's  . Skin cancer     face, ear  and left hand.  . Seizures   . Moderate mitral regurgitation     a. 02/2013 Echo: Mod MR.  . Moderate tricuspid regurgitation     a. 02/2013 Echo: Mod TR.  [name] Pulmonary hypertension     a. 02/2013 Echo: PASP 82mmHg.  . Chest pain     a. H/o nonobs cath;  b. 07/2013 Lexiscan MV: EF 66%, no ischemia.  . Shingles    Past Surgical History  Procedure Laterality Date  . Cerebral aneurysm repair    . Abdominal aortic aneurysm repair      Resection  . Aortic valve replacement      With bioprosthetic valve  . Pacemaker placement  08/2007    [employer]tr[employer]; AF permanent; s/p pacemaker for bradycardia with now well controlled ventricular response; Digoxin therapy, question level; Lower extremity venous  issues  . Breast biopsy    . Cholecystectomy    . Vesicovaginal fistula closure w/ tah    . Appendectomy    . Lung biopsy  2005  . Skin cancer removal      ear, face and hand   Family History  Problem Relation Age of Onset  . Cancer Mother   . Heart disease Mother   . Hypertension Mother   . Varicose Veins Mother   . Heart disease Father   . Hypertension Father   . Heart disease Sister   . Hypertension Sister   . Varicose Veins Sister   . AAA (abdominal aortic aneurysm) Sister   . Heart disease Brother   . Hypertension Brother    History  Substance Use Topics  . Smoking status: Never Smoker   . Smokeless tobacco: Not on file   . Alcohol Use: No   OB History    No data available     Review of Systems  Constitutional: Positive for chills, appetite change and unexpected weight change. Negative for fever.       2 pound weight gain  Respiratory: Positive for shortness of breath. Negative for cough.   Cardiovascular: Negative for chest pain.  All other systems reviewed and are negative.     Allergies  Fluticasone-salmeterol; Furosemide; Iodine; Iohexol; Sulfonamide derivatives; Verapamil; Latex; and Penicillins  Home Medications   Prior to Admission medications   Medication Sig Start Date End Date Taking? Authorizing Provider  acetaminophen (TYLENOL) 500 MG tablet Take 1,000 mg by mouth every 6 (six) hours as needed for moderate pain.   Yes Historical Provider, MD  atenolol (TENORMIN) 100 MG tablet Take 100 mg by mouth daily.   Yes Historical Provider, MD  budesonide-formoterol (SYMBICORT) 160-4.5 MCG/ACT inhaler Inhale 2 puffs into the lungs as needed (FOR SHORTNESS OF BREATH).    Yes Historical Provider, MD  latanoprost (XALATAN) 0.005 % ophthalmic solution Place 1 drop into both eyes at bedtime.   Yes Historical Provider, MD  LORazepam (ATIVAN) 0.5 MG tablet Take 0.5 mg by mouth 3 (three) times daily as needed for anxiety.    Yes Historical Provider, MD  Multiple Vitamins-Minerals (EYE VITAMINS) CAPS Take 1 capsule by mouth daily.    Yes Historical Provider, MD  pantoprazole (PROTONIX) 40 MG tablet Take 40 mg by mouth daily.   Yes Historical Provider, MD  potassium chloride (K-DUR,KLOR-CON) 10 MEQ tablet Take 20 mEq by mouth daily.   Yes Historical Provider, MD  torsemide (DEMADEX) 20 MG tablet Take 1 tablet (20 mg total) by mouth daily. 04/23/15  Yes Minna Merritts, MD  traZODone (DESYREL) 50 MG tablet Take 50 mg by mouth at bedtime.    Yes Historical Provider, MD  warfarin (COUMADIN) 5 MG tablet Take as directed by anticoagulation clinic Patient taking differently: TAKES 5MG  ON MON, WED AND FRI  TAKES  2.5MG  ALL OTHER DAYS 03/26/15  Yes Deboraha Sprang, MD   BP 133/64 mmHg  Pulse 59  Temp(Src) 97.9 F (36.6 C) (Rectal)  Resp 26  Ht 5\' 3"  (1.6 m)  Wt 141 lb (63.957 kg)  BMI 24.98 kg/m2  SpO2 97% Physical Exam  Constitutional: She is oriented to person, place, and time. She appears well-developed and well-nourished. No distress.  HENT:  Head: Normocephalic and atraumatic.  Mouth/Throat: Oropharynx is clear and moist.  Eyes: Conjunctivae and EOM are normal. Pupils are equal, round, and reactive to light.  Neck: Normal range of motion. Neck supple.  Cardiovascular: Normal  rate, regular rhythm and intact distal pulses.   Murmur heard. Pulmonary/Chest: Effort normal and breath sounds normal. No respiratory distress. She has no wheezes. She has no rales. She exhibits no tenderness.  Pacemaker present in the left upper chest wall  Abdominal: Soft. She exhibits no distension. There is tenderness in the suprapubic area. There is CVA tenderness. There is no rebound and no guarding.  Mild left-sided side and CVA tenderness.  Minimal suprapubic tenderness  Musculoskeletal: Normal range of motion. She exhibits tenderness. She exhibits no edema.  Tenderness to palpation of bilateral thighs, calves and bilateral forearms. 2+ pulses in all 4 extremities  Neurological: She is alert and oriented to person, place, and time.  Skin: Skin is warm and dry. No rash noted. No erythema.  Psychiatric: She has a normal mood and affect. Her behavior is normal.  Nursing note and vitals reviewed.   ED Course  Procedures (including critical care time) Labs Review Labs Reviewed  CBC WITH DIFFERENTIAL/PLATELET - Abnormal; Notable for the following:    Platelets 130 (*)    All other components within normal limits  COMPREHENSIVE METABOLIC PANEL - Abnormal; Notable for the following:    Glucose, Bld 114 (*)    Albumin 3.3 (*)    All other components within normal limits  URINALYSIS, ROUTINE W REFLEX MICROSCOPIC  (NOT AT [facility]) - Abnormal; Notable for the following:    Color, Urine AMBER (*)    APPearance CLOUDY (*)    Hgb urine dipstick SMALL (*)    Protein, ur 30 (*)    Urobilinogen, UA 2.0 (*)    Leukocytes, UA SMALL (*)    All other components within normal limits  BRAIN NATRIURETIC PEPTIDE - Abnormal; Notable for the following:    B Natriuretic Peptide 365.6 (*)    All other components within normal limits  CK - Abnormal; Notable for the following:    Total CK 33 (*)    All other components within normal limits  URINE MICROSCOPIC-ADD ON - Abnormal; Notable for the following:    Squamous Epithelial / LPF FEW (*)    All other components within normal limits  URINE CULTURE  I-STAT TROPOININ, ED  I-STAT CG4 LACTIC ACID, ED    Imaging Review Dg Chest 2 View  06/09/2015   CLINICAL DATA:  Shortness of breath.  Hypertension.  EXAM: CHEST  2 VIEW  COMPARISON:  Mar 26, 2015  FINDINGS: There is no edema or consolidation. Heart is borderline prominent with pulmonary vascularity within normal limits, stable. Pacemaker lead is attached to the right ventricle. Aorta is prominent with diffuse atherosclerotic change, stable. Patient is status post aortic valve replacement. No adenopathy. Bones are osteoporotic.  IMPRESSION: Widespread atherosclerotic change in the aorta. Aorta is prominent tortuous, likely secondary to chronic hypertensive type change. No lung edema or consolidation. No change in cardiac silhouette.   Electronically Signed   By: Lowella Grip III M.D.   On: 06/09/2015 15:09     EKG Interpretation   Date/Time:  Saturday June 09 2015 13:50:49 EDT Ventricular Rate:  61 PR Interval:  52 QRS Duration: 140 QT Interval:  492 QTC Calculation: 496 R Axis:   117 Text Interpretation:  new Atrial-sensed ventricular-paced rhythm No  further analysis attempted due to paced rhythm Baseline wander in lead(s)  II III aVF Confirmed by Maryan Rued  MD, Loree Fee ([id number]) on 06/09/2015 1:57:24  PM       MDM   Final diagnoses:  Generalized muscle ache  UTI (lower  urinary tract infection)  Acute on chronic diastolic congestive heart failure    Patient is a 81 year old female with a past medical history of extensive cardiac disease including a AAA and pacemaker placed who presents today with a 5 day history of worsening diffuse body pain, fever chills, several episodes of diarrhea and just not feeling well. She denies any rash, swelling but has noticed a 2 pound weight gain and some mild shortness of breath today. Patient stopped her torsemide on Tuesday per doctor's request.  She has not taken her temperature but has had chills. On exam she has some mild left flank pain and some suprapubic tenderness. She has no chest pain or cough. Her lungs are clear on exam. There is no distal edema or concern for DVT at this time. She has diffuse tenderness with palpation of all muscles in her lower extremities and in her upper extremities. There is no joint swelling.  Concern for possible infectious etiology as the cause of her symptoms versus the shortness of breath as result of stopping her torsemide approximately 1 week ago. EKG shows a paced rhythm without acute findings.  Troponin is within normal limits with patient symptoms ongoing constantly for 4 days if this is cardiac in nature feel that she would have an elevated troponin by now. Lactic acid is within normal limits. CBC within normal limits. CMP, BNP and CK pending. UA with concern of early infection with small leukocytes and 11-20 white blood cells. Culture will be sent.  Rectal temperature normal limits.  Vital signs today are within normal limits.  BNP is mildly elevated from baseline to 360. This is most likely she wasn't taking her torsemide. Again no significant abdominal pain today concerning for diverticulitis however it's possible that there is early inflammation occurring. However at this time will treat for a UTI with Keflex. Patient will  start back on her torsemide and if she is not feeling any better will follow-up with her doctor on Monday. Patient's family members present the findings were discussed with both of them at the same time. They are comfortable with this plan and will return if there are any problems prior to Monday.  Blanchie Dessert, MD 06/09/15 1645  Blanchie Dessert, MD 06/09/15 [place], MD 06/09/15 1423

## 2015-06-09 NOTE — ED Notes (Signed)
Pt here from home with c/o generalized muscle pain when moving starting Wednesday. Pt reporting SOB starting this am. Pt has cardiac hx with pacemaker- unsure what kind. Pt reports nausea and diarrhea in the mornings since Wednesday as well. NAD noted.

## 2015-06-11 LAB — URINE CULTURE

## 2015-06-14 ENCOUNTER — Telehealth: Payer: Self-pay

## 2015-06-14 NOTE — Telephone Encounter (Signed)
Pt niece called, states pt is now taking 15 mg of Prednisone a day, she asks if that will effect her coumadin. Please advise.

## 2015-06-14 NOTE — Telephone Encounter (Signed)
Returned call to pt's daughter, pt started taking Prednisone 15mg  daily 06/13/15. Advised Prednisone can increase effects of Coumadin and cause the INR to become elevated.  Moved pt's f/u appt from 8/10 to be checked sooner on 06/20/15 in [place].  Advised to monitor for bleeding problems.

## 2015-06-20 ENCOUNTER — Ambulatory Visit (INDEPENDENT_AMBULATORY_CARE_PROVIDER_SITE_OTHER): Payer: Medicare Other | Admitting: *Deleted

## 2015-06-20 DIAGNOSIS — I4891 Unspecified atrial fibrillation: Secondary | ICD-10-CM

## 2015-06-20 DIAGNOSIS — G459 Transient cerebral ischemic attack, unspecified: Secondary | ICD-10-CM

## 2015-06-20 DIAGNOSIS — Z954 Presence of other heart-valve replacement: Secondary | ICD-10-CM | POA: Diagnosis not present

## 2015-06-20 DIAGNOSIS — I359 Nonrheumatic aortic valve disorder, unspecified: Secondary | ICD-10-CM

## 2015-06-20 DIAGNOSIS — Z5181 Encounter for therapeutic drug level monitoring: Secondary | ICD-10-CM

## 2015-06-20 DIAGNOSIS — Z953 Presence of xenogenic heart valve: Secondary | ICD-10-CM

## 2015-06-20 LAB — POCT INR: INR: 2.7

## 2015-06-27 ENCOUNTER — Ambulatory Visit (INDEPENDENT_AMBULATORY_CARE_PROVIDER_SITE_OTHER): Payer: Medicare Other | Admitting: *Deleted

## 2015-06-27 DIAGNOSIS — I359 Nonrheumatic aortic valve disorder, unspecified: Secondary | ICD-10-CM | POA: Diagnosis not present

## 2015-06-27 DIAGNOSIS — G459 Transient cerebral ischemic attack, unspecified: Secondary | ICD-10-CM | POA: Diagnosis not present

## 2015-06-27 DIAGNOSIS — I4891 Unspecified atrial fibrillation: Secondary | ICD-10-CM

## 2015-06-27 DIAGNOSIS — Z954 Presence of other heart-valve replacement: Secondary | ICD-10-CM | POA: Diagnosis not present

## 2015-06-27 DIAGNOSIS — Z953 Presence of xenogenic heart valve: Secondary | ICD-10-CM

## 2015-06-27 DIAGNOSIS — Z5181 Encounter for therapeutic drug level monitoring: Secondary | ICD-10-CM

## 2015-06-27 LAB — POCT INR: INR: 2.5

## 2015-07-25 ENCOUNTER — Ambulatory Visit (INDEPENDENT_AMBULATORY_CARE_PROVIDER_SITE_OTHER): Payer: Medicare Other

## 2015-07-25 DIAGNOSIS — Z953 Presence of xenogenic heart valve: Secondary | ICD-10-CM

## 2015-07-25 DIAGNOSIS — G459 Transient cerebral ischemic attack, unspecified: Secondary | ICD-10-CM | POA: Diagnosis not present

## 2015-07-25 DIAGNOSIS — Z5181 Encounter for therapeutic drug level monitoring: Secondary | ICD-10-CM | POA: Diagnosis not present

## 2015-07-25 DIAGNOSIS — I4891 Unspecified atrial fibrillation: Secondary | ICD-10-CM

## 2015-07-25 DIAGNOSIS — Z954 Presence of other heart-valve replacement: Secondary | ICD-10-CM

## 2015-07-25 DIAGNOSIS — I359 Nonrheumatic aortic valve disorder, unspecified: Secondary | ICD-10-CM

## 2015-07-25 LAB — POCT INR: INR: 4.5

## 2015-08-01 ENCOUNTER — Encounter (HOSPITAL_COMMUNITY): Payer: Self-pay | Admitting: Neurology

## 2015-08-01 ENCOUNTER — Emergency Department (HOSPITAL_COMMUNITY)
Admission: EM | Admit: 2015-08-01 | Discharge: 2015-08-02 | Disposition: A | Discharge status: Home / Self Care | Payer: Medicare Other | Attending: Emergency Medicine | Admitting: Emergency Medicine

## 2015-08-01 DIAGNOSIS — Z91040 Latex allergy status: Secondary | ICD-10-CM | POA: Diagnosis not present

## 2015-08-01 DIAGNOSIS — I10 Essential (primary) hypertension: Secondary | ICD-10-CM | POA: Diagnosis not present

## 2015-08-01 DIAGNOSIS — Z950 Presence of cardiac pacemaker: Secondary | ICD-10-CM | POA: Insufficient documentation

## 2015-08-01 DIAGNOSIS — I5032 Chronic diastolic (congestive) heart failure: Secondary | ICD-10-CM | POA: Insufficient documentation

## 2015-08-01 DIAGNOSIS — M791 Myalgia, unspecified site: Secondary | ICD-10-CM

## 2015-08-01 DIAGNOSIS — Z79899 Other long term (current) drug therapy: Secondary | ICD-10-CM | POA: Diagnosis not present

## 2015-08-01 DIAGNOSIS — Z85828 Personal history of other malignant neoplasm of skin: Secondary | ICD-10-CM | POA: Diagnosis not present

## 2015-08-01 DIAGNOSIS — Z8619 Personal history of other infectious and parasitic diseases: Secondary | ICD-10-CM | POA: Diagnosis not present

## 2015-08-01 DIAGNOSIS — Z880 Allergy status to penicillin: Secondary | ICD-10-CM | POA: Diagnosis not present

## 2015-08-01 LAB — CBC WITH DIFFERENTIAL/PLATELET
BASOS ABS: 0 10*3/uL (ref 0.0–0.1)
Basophils Relative: 0 %
EOS PCT: 3 %
Eosinophils Absolute: 0.1 10*3/uL (ref 0.0–0.7)
HEMATOCRIT: 42.4 % (ref 36.0–46.0)
Hemoglobin: 13.4 g/dL (ref 12.0–15.0)
LYMPHS ABS: 1.7 10*3/uL (ref 0.7–4.0)
LYMPHS PCT: 30 %
MCH: 29.5 pg (ref 26.0–34.0)
MCHC: 31.6 g/dL (ref 30.0–36.0)
MCV: 93.2 fL (ref 78.0–100.0)
MONO ABS: 0.6 10*3/uL (ref 0.1–1.0)
Monocytes Relative: 10 %
NEUTROS ABS: 3.2 10*3/uL (ref 1.7–7.7)
Neutrophils Relative %: 57 %
Platelets: 188 10*3/uL (ref 150–400)
RBC: 4.55 MIL/uL (ref 3.87–5.11)
RDW: 14.4 % (ref 11.5–15.5)
WBC: 5.6 10*3/uL (ref 4.0–10.5)

## 2015-08-01 LAB — URINALYSIS, ROUTINE W REFLEX MICROSCOPIC
Bilirubin Urine: NEGATIVE
Glucose, UA: NEGATIVE mg/dL
Hgb urine dipstick: NEGATIVE
Ketones, ur: NEGATIVE mg/dL
LEUKOCYTES UA: NEGATIVE
NITRITE: NEGATIVE
PH: 7 (ref 5.0–8.0)
Protein, ur: NEGATIVE mg/dL
SPECIFIC GRAVITY, URINE: 1.011 (ref 1.005–1.030)
UROBILINOGEN UA: 1 mg/dL (ref 0.0–1.0)

## 2015-08-01 LAB — COMPREHENSIVE METABOLIC PANEL
ALBUMIN: 3.3 g/dL — AB (ref 3.5–5.0)
ALT: 12 U/L — ABNORMAL LOW (ref 14–54)
ANION GAP: 10 (ref 5–15)
AST: 26 U/L (ref 15–41)
Alkaline Phosphatase: 79 U/L (ref 38–126)
BILIRUBIN TOTAL: 0.7 mg/dL (ref 0.3–1.2)
BUN: 22 mg/dL — AB (ref 6–20)
CHLORIDE: 100 mmol/L — AB (ref 101–111)
CO2: 31 mmol/L (ref 22–32)
Calcium: 8.9 mg/dL (ref 8.9–10.3)
Creatinine, Ser: 1 mg/dL (ref 0.44–1.00)
GFR calc Af Amer: 56 mL/min — ABNORMAL LOW (ref >60)
GFR calc non Af Amer: 48 mL/min — ABNORMAL LOW (ref >60)
GLUCOSE: 119 mg/dL — AB (ref 65–99)
POTASSIUM: 4 mmol/L (ref 3.5–5.1)
Sodium: 141 mmol/L (ref 135–145)
TOTAL PROTEIN: 7 g/dL (ref 6.5–8.1)

## 2015-08-01 LAB — CK: CK TOTAL: 35 U/L — AB (ref 38–234)

## 2015-08-01 LAB — SEDIMENTATION RATE: Sed Rate: 47 mm/hr — ABNORMAL HIGH (ref 0–22)

## 2015-08-01 MED ORDER — PREDNISONE 20 MG PO TABS
20.0000 mg | ORAL_TABLET | Freq: Every day | ORAL | Status: DC
  Administered 2015-08-01: 20 mg via ORAL
  Filled 2015-08-01: qty 1

## 2015-08-01 MED ORDER — PREDNISOLONE 5 MG PO TABS: 15.0000 mg | ORAL_TABLET | Freq: Every day | ORAL | Status: DC

## 2015-08-01 NOTE — ED Notes (Addendum)
Since July pt has been having muscle pain from her shoulders to her knees. Initially had UTI, but muscle pain didn't resolve. Was started on prednisone which helped but they took her off due to side effects. Pt denies any injuries but has constant muscle pain.

## 2015-08-01 NOTE — Discharge Instructions (Signed)
Will start you back and prednisone. Prescription given for 2 weeks. Follow-up your primary care doctor. Sedimentation rate elevated today.

## 2015-08-01 NOTE — ED Provider Notes (Signed)
CSN: [id number]     Arrival date & time 08/01/15  1608 History   First MD Initiated Contact with Patient 08/01/15 1847     Chief Complaint  Patient presents with  . Muscle Pain     (Consider location/radiation/quality/duration/timing/severity/associated sxs/prior Treatment) HPI.... Patient is complaining of generalized muscle pain since July 2016. She claims a remote history of polymyalgia rheumatica many years ago. At that time she was prescribed prednisone which seemed to help. No fever, sweats, chills, weight loss. Patient is very active at home and able to do her normal ADLs. Severity is moderate. Nothing makes symptoms better or worse.  Past Medical History  Diagnosis Date  . Permanent atrial fibrillation     a. Chronic coumadin.  . Tachy-brady syndrome     a. Severe in the setting of permanent atrial fibrillation with tachybrady syndrome;  b. 08/2007 s/p MDT Adapta ADSR01 DC PPM.  . S/P placement of cardiac pacemaker     a.  08/2007 s/p MDT [place] PPM.  . Brain aneurysm     Hx of  . Hypertension   . Thoracic aneurysm     a. 10/2011 CTA: 5.0 x 3.8 cm.  [name] AAA (abdominal aortic aneurysm)     a. 12/2011 U/S: 3.3 cm prox AAA.  . S/P aortic valve replacement with bioprosthetic valve     a. [facility].  [name] Aortic insufficiency     a. S/P bioprosthetic AVR;  b. 02/2013 Echo: EF 50-55%, mild to mod AI (mean grad 57mmHg).  . Chronic diastolic CHF (congestive heart failure)     a. 02/2013 Echo: EF 50-55%.  . Diverticulosis   . Hemorrhoids   . Gastritis   . Hepatitis B 1980's  . Skin cancer     face, ear  and left hand.  . Seizures   . Moderate mitral regurgitation     a. 02/2013 Echo: Mod MR.  . Moderate tricuspid regurgitation     a. 02/2013 Echo: Mod TR.  [name] Pulmonary hypertension     a. 02/2013 Echo: PASP 23mmHg.  . Chest pain     a. H/o nonobs cath;  b. 07/2013 Lexiscan MV: EF 66%, no ischemia.  . Shingles    Past Surgical History  Procedure Laterality Date  .  Cerebral aneurysm repair    . Abdominal aortic aneurysm repair      Resection  . Aortic valve replacement      With bioprosthetic valve  . Pacemaker placement  08/2007    [employer]tr[employer]; AF permanent; s/p pacemaker for bradycardia with now well controlled ventricular response; Digoxin therapy, question level; Lower extremity venous issues  . Breast biopsy    . Cholecystectomy    . Vesicovaginal fistula closure w/ tah    . Appendectomy    . Lung biopsy  2005  . Skin cancer removal      ear, face and hand   Family History  Problem Relation Age of Onset  . Cancer Mother   . Heart disease Mother   . Hypertension Mother   . Varicose Veins Mother   . Heart disease Father   . Hypertension Father   . Heart disease Sister   . Hypertension Sister   . Varicose Veins Sister   . AAA (abdominal aortic aneurysm) Sister   . Heart disease Brother   . Hypertension Brother    Social History  Substance Use Topics  . Smoking status: Never Smoker   . Smokeless tobacco: None  . Alcohol Use:  No   OB History    No data available     Review of Systems  All other systems reviewed and are negative.     Allergies  Fluticasone-salmeterol; Furosemide; Iodine; Iohexol; Sulfonamide derivatives; Verapamil; Latex; and Penicillins  Home Medications   Prior to Admission medications   Medication Sig Start Date End Date Taking? Authorizing Provider  atenolol (TENORMIN) 100 MG tablet Take 100 mg by mouth daily.   Yes Historical Provider, MD  diclofenac sodium (VOLTAREN) 1 % GEL Apply 4 g topically 2 (two) times daily.   Yes Historical Provider, MD  Hydrocodone-Acetaminophen 2.5-325 MG TABS Take 1 tablet by mouth 2 (two) times daily.   Yes Historical Provider, MD  latanoprost (XALATAN) 0.005 % ophthalmic solution Place 1 drop into both eyes at bedtime.   Yes Historical Provider, MD  LORazepam (ATIVAN) 0.5 MG tablet Take 0.5 mg by mouth 3 (three) times daily as needed for anxiety.    Yes Historical  Provider, MD  Multiple Vitamins-Minerals (EYE VITAMINS) CAPS Take 1 capsule by mouth daily.    Yes Historical Provider, MD  pantoprazole (PROTONIX) 40 MG tablet Take 40 mg by mouth daily.   Yes Historical Provider, MD  potassium chloride (K-DUR,KLOR-CON) 10 MEQ tablet Take 20 mEq by mouth daily.   Yes Historical Provider, MD  torsemide (DEMADEX) 20 MG tablet Take 1 tablet (20 mg total) by mouth daily. 04/23/15  Yes Minna Merritts, MD  traZODone (DESYREL) 50 MG tablet Take 50 mg by mouth at bedtime.    Yes Historical Provider, MD  warfarin (COUMADIN) 5 MG tablet Take as directed by anticoagulation clinic Patient taking differently: Take 5 mg by mouth daily at 6 PM. TAKES 5MG  ON MON AND FRI  TAKES 2.5MG  ALL OTHER DAYS 03/26/15  Yes Deboraha Sprang, MD  prednisoLONE 5 MG TABS tablet Take 3 tablets (15 mg total) by mouth daily. 08/01/15   Nat Christen, MD   BP 122/44 mmHg  Pulse 67  Temp(Src) 98 F (36.7 C) (Oral)  Resp 16  SpO2 94% Physical Exam  Constitutional: She is oriented to person, place, and time. She appears well-developed and well-nourished.  HENT:  Head: Normocephalic and atraumatic.  Eyes: Conjunctivae and EOM are normal. Pupils are equal, round, and reactive to light.  Neck: Normal range of motion. Neck supple.  Cardiovascular: Normal rate and regular rhythm.   Pulmonary/Chest: Effort normal and breath sounds normal.  Abdominal: Soft. Bowel sounds are normal.  Musculoskeletal: Normal range of motion.  Patient has full range of motion of her extremities  Neurological: She is alert and oriented to person, place, and time.  Skin: Skin is warm and dry.  Psychiatric: She has a normal mood and affect. Her behavior is normal.  Nursing note and vitals reviewed.   ED Course  Procedures (including critical care time) Labs Review Labs Reviewed  COMPREHENSIVE METABOLIC PANEL - Abnormal; Notable for the following:    Chloride 100 (*)    Glucose, Bld 119 (*)    BUN 22 (*)    Albumin  3.3 (*)    ALT 12 (*)    GFR calc non Af Amer 48 (*)    GFR calc Af Amer 56 (*)    All other components within normal limits  SEDIMENTATION RATE - Abnormal; Notable for the following:    Sed Rate 47 (*)    All other components within normal limits  CK - Abnormal; Notable for the following:    Total CK 35 (*)  All other components within normal limits  CBC WITH DIFFERENTIAL/PLATELET  URINALYSIS, ROUTINE W REFLEX MICROSCOPIC (NOT AT [facility])    Imaging Review No results found. I have personally reviewed and evaluated these images and lab results as part of my medical decision-making.   EKG Interpretation None      MDM   Final diagnoses:  Muscle pain    Patient is in no acute distress. Erythrocyte sedimentation rate elevated. CK normal. Will start prednisone. Patient has primary care follow-up. I'm concerned about polymyalgia rheumatica    Nat Christen, MD 08/01/15 [phone]

## 2015-08-02 ENCOUNTER — Telehealth: Payer: Self-pay | Admitting: *Deleted

## 2015-08-02 DIAGNOSIS — M791 Myalgia: Secondary | ICD-10-CM | POA: Diagnosis not present

## 2015-08-02 NOTE — Telephone Encounter (Signed)
Pharmacy called related to [id number] 5 MG TABS tablet, Dispense 45... Being $325.Marland KitchenMarland KitchenNCM clarified with EDP to change Rx [id number].

## 2015-08-08 ENCOUNTER — Ambulatory Visit (INDEPENDENT_AMBULATORY_CARE_PROVIDER_SITE_OTHER): Payer: Medicare Other

## 2015-08-08 DIAGNOSIS — I359 Nonrheumatic aortic valve disorder, unspecified: Secondary | ICD-10-CM

## 2015-08-08 DIAGNOSIS — Z954 Presence of other heart-valve replacement: Secondary | ICD-10-CM | POA: Diagnosis not present

## 2015-08-08 DIAGNOSIS — G459 Transient cerebral ischemic attack, unspecified: Secondary | ICD-10-CM

## 2015-08-08 DIAGNOSIS — Z5181 Encounter for therapeutic drug level monitoring: Secondary | ICD-10-CM | POA: Diagnosis not present

## 2015-08-08 DIAGNOSIS — I4891 Unspecified atrial fibrillation: Secondary | ICD-10-CM

## 2015-08-08 DIAGNOSIS — Z953 Presence of xenogenic heart valve: Secondary | ICD-10-CM

## 2015-08-08 LAB — POCT INR: INR: 3

## 2015-08-23 ENCOUNTER — Encounter: Payer: Medicare Other | Admitting: Internal Medicine

## 2015-08-24 ENCOUNTER — Ambulatory Visit (INDEPENDENT_AMBULATORY_CARE_PROVIDER_SITE_OTHER): Payer: Medicare Other

## 2015-08-24 ENCOUNTER — Other Ambulatory Visit: Payer: Self-pay

## 2015-08-24 DIAGNOSIS — I359 Nonrheumatic aortic valve disorder, unspecified: Secondary | ICD-10-CM | POA: Diagnosis not present

## 2015-08-24 DIAGNOSIS — Z5181 Encounter for therapeutic drug level monitoring: Secondary | ICD-10-CM | POA: Diagnosis not present

## 2015-08-24 DIAGNOSIS — G459 Transient cerebral ischemic attack, unspecified: Secondary | ICD-10-CM | POA: Diagnosis not present

## 2015-08-24 DIAGNOSIS — I4891 Unspecified atrial fibrillation: Secondary | ICD-10-CM

## 2015-08-24 DIAGNOSIS — Z954 Presence of other heart-valve replacement: Secondary | ICD-10-CM | POA: Diagnosis not present

## 2015-08-24 DIAGNOSIS — Z953 Presence of xenogenic heart valve: Secondary | ICD-10-CM

## 2015-08-24 LAB — POCT INR: INR: 4.5

## 2015-08-24 MED ORDER — PREDNISOLONE 5 MG PO TABS: 5.0000 mg | ORAL_TABLET | Freq: Every day | ORAL | Status: DC

## 2015-08-31 ENCOUNTER — Ambulatory Visit (INDEPENDENT_AMBULATORY_CARE_PROVIDER_SITE_OTHER): Payer: Medicare Other | Admitting: Cardiovascular Disease

## 2015-08-31 ENCOUNTER — Encounter: Payer: Self-pay | Admitting: Cardiovascular Disease

## 2015-08-31 VITALS — BP 140/62 | HR 65 | Ht 63.0 in | Wt 139.0 lb

## 2015-08-31 DIAGNOSIS — M791 Myalgia, unspecified site: Secondary | ICD-10-CM | POA: Insufficient documentation

## 2015-08-31 DIAGNOSIS — N179 Acute kidney failure, unspecified: Secondary | ICD-10-CM

## 2015-08-31 DIAGNOSIS — I4891 Unspecified atrial fibrillation: Secondary | ICD-10-CM | POA: Diagnosis not present

## 2015-08-31 DIAGNOSIS — R42 Dizziness and giddiness: Secondary | ICD-10-CM

## 2015-08-31 DIAGNOSIS — I272 Other secondary pulmonary hypertension: Secondary | ICD-10-CM | POA: Diagnosis not present

## 2015-08-31 DIAGNOSIS — Z954 Presence of other heart-valve replacement: Secondary | ICD-10-CM | POA: Diagnosis not present

## 2015-08-31 DIAGNOSIS — I739 Peripheral vascular disease, unspecified: Secondary | ICD-10-CM

## 2015-08-31 DIAGNOSIS — Z953 Presence of xenogenic heart valve: Secondary | ICD-10-CM

## 2015-08-31 NOTE — Patient Instructions (Signed)
You are doing well. No medication changes were made.  Try to cut atenolol and take 1/2 pill twice a day (for dizziness) Or move atenolol whole pill to evening (pm or before bed)  Please call us if you have new issues that need to be addressed before your next appt.  Your physician wants you to follow-up in: 6 months.  You will receive a reminder letter in the mail two months in advance. If you don't receive a letter, please call our office to schedule the follow-up appointment.

## 2015-08-31 NOTE — Assessment & Plan Note (Signed)
Etiology is unclear, managed by primary care Likely rheumatologic issue. Minimal improvement with low-dose prednisone. No contraindication from a cardiac perspective to trying higher dose prednisone She has follow-up with rheumatology before the end of the year

## 2015-08-31 NOTE — Assessment & Plan Note (Signed)
Family reports dizziness  in the morning after taking atenolol Recommend she take 50 mg twice a day or full pill in the evenings

## 2015-08-31 NOTE — Assessment & Plan Note (Signed)
Well-seated prosthetic aortic valve on recent echo April 2016

## 2015-08-31 NOTE — Progress Notes (Signed)
Patient ID: [name], female    DOB: [birth year], 81 y.o.   MRN: [id number]  HPI Comments: 81 yo WF with history of tachy-brady syndrome s/p pacemaker, Permanent atrial fibrillation on warfarin, HTN, AVR, AAA with repair 4-5 years ago, and chronic diastolic heart failure,  right brachial arterial stenosis, With TIA in 2012,  presenting for routine followup. No significant coronary artery disease or carotid arterial disease Echocardiogram in 2012 showing moderate MR and TR with moderate pulmonary hypertension She has significant peripheral vascular disease, followed by Dr. Donnetta Hutching.  She presents today for follow-up of her PAD, atrial fibrillation  In follow-up today she reports that she has been having severe pain in her arms, back. Started on low-dose prednisone by primary care, 5 mg daily. Mild improvement but still with severe pain She was told she might have polymyalgia rheumatica She is scheduled to see rheumatology. Also reports having numerous "blood blisters"that seem to appear out of nowhere, worse on the right leg and the left leg. Sore on the dorsum of her left foot.  She has been taking torsemide 20 g daily with stable weight, stable breathing, renal function Echocardiogram April 2016 with significant pulmonary hypertension. Weight is stable at 136 pounds at home, less shortness of breath  EKG on today's visit shows atrial fibrillation, ventricularly paced rhythm Recent chest x-ray reviewed with her showing resolution of right pleural effusion Recent lab work showing creatinine 0.76   Other past medical history pneumonia in October 2015.   chronic knee pain Some shortness of breath but is not very active at baseline  History of severe COPD exacerbation, bronchitis. She was on Z-Pak, prednisone, started on albuterol inhaler She took several days of diuretics, torsemide,  with mild weight loss now down to 140 pounds. No significant edema. Symptoms improved  Previous  Evaluation of her pacemaker report shows she is 60% RV paced  admitted to the hospital at [facility] on Apr 16 2011 with shortness of breath, acute on chronic diastolic heart failure, underlying hypertension.  possible  pneumonia. White blood cell count was 13. Antibiotics were discontinued after her chest x-ray cleared.  Numerous skin cancers over the past several years, on her legs, face, arms requiring resection History of arthritis in her knees requiring cortisone shot  cardiac catheterization over 5 years ago showing no significant coronary artery disease, recent CT scan showing stable descending aorta and after repair, minimal dilatation of the descending aorta, stable-appearing aortic valve.   Allergies  Allergen Reactions  . Fluticasone-Salmeterol Anaphylaxis  . Furosemide Shortness Of Breath  . Iodine Other (See Comments)    FEVER AND CHILLS  . Iohexol Other (See Comments)     Desc: patient requires a 13 hr prep, when doing exam with contrast,,,,,fever; chills; dyspnea(slg/04/27/09--given 2 hour solumedrol/ benadryl prep--no reaction)   . Sulfonamide Derivatives Nausea And Vomiting  . Verapamil Nausea Only and Other (See Comments)    LOSS OF APPETITE  . Latex Rash  . Penicillins Rash    Outpatient Encounter Prescriptions as of 08/31/2015  Medication Sig  . atenolol (TENORMIN) 100 MG tablet Take 100 mg by mouth daily.  . diclofenac sodium (VOLTAREN) 1 % GEL Apply 4 g topically 2 (two) times daily.  . Hydrocodone-Acetaminophen 2.5-325 MG TABS Take 1 tablet by mouth 2 (two) times daily.  [name] latanoprost (XALATAN) 0.005 % ophthalmic solution Place 1 drop into both eyes at bedtime.  [name] LORazepam (ATIVAN) 0.5 MG tablet Take 0.5 mg by mouth 3 (three) times daily as  needed for anxiety.   . Multiple Vitamins-Minerals (EYE VITAMINS) CAPS Take 1 capsule by mouth daily.   . pantoprazole (PROTONIX) 40 MG tablet Take 40 mg by mouth daily.  . potassium chloride (K-DUR,KLOR-CON) 10 MEQ tablet Take  20 mEq by mouth daily.  . prednisoLONE 5 MG TABS tablet Take 1 tablet (5 mg total) by mouth daily.  [name] torsemide (DEMADEX) 20 MG tablet Take 1 tablet (20 mg total) by mouth daily.  . traZODone (DESYREL) 50 MG tablet Take 50 mg by mouth at bedtime.   [name] warfarin (COUMADIN) 5 MG tablet Take as directed by anticoagulation clinic (Patient taking differently: Take 5 mg by mouth daily at 6 PM. TAKES 5MG  ON MON AND FRI  TAKES 2.5MG  ALL OTHER DAYS)   No facility-administered encounter medications on file as of 08/31/2015.    Past Medical History  Diagnosis Date  . Permanent atrial fibrillation ([place])     a. Chronic coumadin.  . Tachy-brady syndrome ([place])     a. Severe in the setting of permanent atrial fibrillation with tachybrady syndrome;  b. 08/2007 s/p MDT Adapta ADSR01 DC PPM.  . S/P placement of cardiac pacemaker     a.  08/2007 s/p MDT [place] PPM.  . Brain aneurysm     Hx of  . Hypertension   . Thoracic aneurysm     a. 10/2011 CTA: 5.0 x 3.8 cm.  [name] AAA (abdominal aortic aneurysm) ([place])     a. 12/2011 U/S: 3.3 cm prox AAA.  . S/P aortic valve replacement with bioprosthetic valve     a. [facility].  [name] Aortic insufficiency     a. S/P bioprosthetic AVR;  b. 02/2013 Echo: EF 50-55%, mild to mod AI (mean grad 80mmHg).  . Chronic diastolic CHF (congestive heart failure) ([place])     a. 02/2013 Echo: EF 50-55%.  . Diverticulosis   . Hemorrhoids   . Gastritis   . Hepatitis B 1980's  . Skin cancer     face, ear  and left hand.  . Seizures ([place])   . Moderate mitral regurgitation     a. 02/2013 Echo: Mod MR.  . Moderate tricuspid regurgitation     a. 02/2013 Echo: Mod TR.  [name] Pulmonary hypertension ([place])     a. 02/2013 Echo: PASP 12mmHg.  . Chest pain     a. H/o nonobs cath;  b. 07/2013 Lexiscan MV: EF 66%, no ischemia.  . Shingles     Past Surgical History  Procedure Laterality Date  . Cerebral aneurysm repair    . Abdominal aortic aneurysm repair      Resection  . Aortic  valve replacement      With bioprosthetic valve  . Pacemaker placement  08/2007    Medtronic; AF permanent; s/p pacemaker for bradycardia with now well controlled ventricular response; Digoxin therapy, question level; Lower extremity venous issues  . Breast biopsy    . Cholecystectomy    . Vesicovaginal fistula closure w/ tah    . Appendectomy    . Lung biopsy  2005  . Skin cancer removal      ear, face and hand    Social History  reports that she has never smoked. She does not have any smokeless tobacco history on file. She reports that she does not drink alcohol or use illicit drugs.  Family History family history includes AAA (abdominal aortic aneurysm) in her sister; Cancer in her mother; Heart disease in her brother, father, mother, and sister;  Hypertension in her brother, father, mother, and sister; Varicose Veins in her mother and sister.   Review of Systems  Constitutional: Negative.   Respiratory: Negative.   Cardiovascular: Negative.   Gastrointestinal: Negative.   Musculoskeletal: Positive for myalgias, arthralgias and gait problem.  Skin: Negative.        Healing wound on the top of her left foot, painful to touch  Neurological: Negative.   Hematological: Negative.   Psychiatric/Behavioral: Negative.   All other systems reviewed and are negative.   BP 140/62 mmHg  Pulse 65  Ht 5\' 3"  (1.6 m)  Wt 139 lb (63.05 kg)  BMI 24.63 kg/m2  Physical Exam  Constitutional: She is oriented to person, place, and time. She appears well-developed and well-nourished.  Presenting today in a wheelchair  HENT:  Head: Normocephalic.  Nose: Nose normal.  Mouth/Throat: Oropharynx is clear and moist.  Eyes: Conjunctivae are normal. Pupils are equal, round, and reactive to light.  Neck: Normal range of motion. Neck supple. No JVD present.  Cardiovascular: Normal rate, S1 normal, S2 normal and intact distal pulses.  Exam reveals no gallop and no friction rub.   No murmur  heard. Pulmonary/Chest: Effort normal. No respiratory distress. She has decreased breath sounds. She has no wheezes. She has no rales. She exhibits no tenderness.  Abdominal: Soft. Bowel sounds are normal. She exhibits no distension. There is no tenderness.  Musculoskeletal: Normal range of motion. She exhibits no edema or tenderness.  Lymphadenopathy:    She has no cervical adenopathy.  Neurological: She is alert and oriented to person, place, and time. Coordination normal.  Skin: Skin is warm and dry. No rash noted. No erythema.  Psychiatric: She has a normal mood and affect. Her behavior is normal. Judgment and thought content normal.    Assessment and Plan  Nursing note and vitals reviewed.

## 2015-08-31 NOTE — Assessment & Plan Note (Signed)
Paced rhythm. On anticoagulation

## 2015-08-31 NOTE — Assessment & Plan Note (Signed)
abdominal aortic aneurysm followed in [facility]

## 2015-08-31 NOTE — Assessment & Plan Note (Signed)
Moderate to severe pulmonary hypertension on echocardiogram April 2016 She has done well on torsemide 20 mg daily, stable weight with improved shortness of breath Will likely need to run her on the slightly "dry" side for symptom relief.

## 2015-09-04 ENCOUNTER — Other Ambulatory Visit: Payer: Self-pay | Admitting: Cardiovascular Disease

## 2015-09-05 ENCOUNTER — Ambulatory Visit (INDEPENDENT_AMBULATORY_CARE_PROVIDER_SITE_OTHER): Payer: Medicare Other

## 2015-09-05 DIAGNOSIS — G459 Transient cerebral ischemic attack, unspecified: Secondary | ICD-10-CM | POA: Diagnosis not present

## 2015-09-05 DIAGNOSIS — I359 Nonrheumatic aortic valve disorder, unspecified: Secondary | ICD-10-CM | POA: Diagnosis not present

## 2015-09-05 DIAGNOSIS — I4891 Unspecified atrial fibrillation: Secondary | ICD-10-CM

## 2015-09-05 DIAGNOSIS — Z5181 Encounter for therapeutic drug level monitoring: Secondary | ICD-10-CM | POA: Diagnosis not present

## 2015-09-05 LAB — POCT INR: INR: 2.9

## 2015-09-19 ENCOUNTER — Telehealth: Payer: Self-pay | Admitting: *Deleted

## 2015-09-19 NOTE — Telephone Encounter (Signed)
[name] called and she said that [name] saw the Rhematologist and increased her prednisone from 10 mg to 20 mg.

## 2015-09-20 ENCOUNTER — Other Ambulatory Visit: Payer: Self-pay | Admitting: Internal Medicine

## 2015-09-20 NOTE — Telephone Encounter (Signed)
Returned call to [name], pt has an appt scheduled for next Wednesday in [place] office on 09/26/15 advised to keep scheduled appt and increase vit K intake while on higher dose Prednisone.

## 2015-09-26 ENCOUNTER — Ambulatory Visit (INDEPENDENT_AMBULATORY_CARE_PROVIDER_SITE_OTHER): Payer: Medicare Other | Admitting: *Deleted

## 2015-09-26 DIAGNOSIS — Z954 Presence of other heart-valve replacement: Secondary | ICD-10-CM | POA: Diagnosis not present

## 2015-09-26 DIAGNOSIS — Z5181 Encounter for therapeutic drug level monitoring: Secondary | ICD-10-CM | POA: Diagnosis not present

## 2015-09-26 DIAGNOSIS — I359 Nonrheumatic aortic valve disorder, unspecified: Secondary | ICD-10-CM | POA: Diagnosis not present

## 2015-09-26 DIAGNOSIS — I4891 Unspecified atrial fibrillation: Secondary | ICD-10-CM | POA: Diagnosis not present

## 2015-09-26 DIAGNOSIS — G459 Transient cerebral ischemic attack, unspecified: Secondary | ICD-10-CM

## 2015-09-26 DIAGNOSIS — Z953 Presence of xenogenic heart valve: Secondary | ICD-10-CM

## 2015-09-26 LAB — POCT INR: INR: 3.3

## 2015-10-02 ENCOUNTER — Emergency Department
Admission: EM | Admit: 2015-10-02 | Discharge: 2015-10-02 | Disposition: A | Discharge status: Home / Self Care | Payer: Medicare Other | Attending: Internal Medicine | Admitting: Internal Medicine

## 2015-10-02 ENCOUNTER — Ambulatory Visit (INDEPENDENT_AMBULATORY_CARE_PROVIDER_SITE_OTHER): Payer: Medicare Other

## 2015-10-02 ENCOUNTER — Encounter: Payer: Self-pay | Admitting: Emergency Medicine

## 2015-10-02 ENCOUNTER — Emergency Department: Payer: Medicare Other

## 2015-10-02 ENCOUNTER — Encounter: Payer: Self-pay | Admitting: Internal Medicine

## 2015-10-02 ENCOUNTER — Ambulatory Visit (INDEPENDENT_AMBULATORY_CARE_PROVIDER_SITE_OTHER): Payer: Medicare Other | Admitting: Internal Medicine

## 2015-10-02 VITALS — BP 150/80 | HR 71 | Ht 63.0 in | Wt 139.2 lb

## 2015-10-02 DIAGNOSIS — I10 Essential (primary) hypertension: Secondary | ICD-10-CM | POA: Insufficient documentation

## 2015-10-02 DIAGNOSIS — G459 Transient cerebral ischemic attack, unspecified: Secondary | ICD-10-CM

## 2015-10-02 DIAGNOSIS — Z79899 Other long term (current) drug therapy: Secondary | ICD-10-CM | POA: Diagnosis not present

## 2015-10-02 DIAGNOSIS — I4891 Unspecified atrial fibrillation: Secondary | ICD-10-CM

## 2015-10-02 DIAGNOSIS — Z7901 Long term (current) use of anticoagulants: Secondary | ICD-10-CM | POA: Diagnosis not present

## 2015-10-02 DIAGNOSIS — L03115 Cellulitis of right lower limb: Secondary | ICD-10-CM

## 2015-10-02 DIAGNOSIS — M7981 Nontraumatic hematoma of soft tissue: Secondary | ICD-10-CM | POA: Insufficient documentation

## 2015-10-02 DIAGNOSIS — I359 Nonrheumatic aortic valve disorder, unspecified: Secondary | ICD-10-CM | POA: Diagnosis not present

## 2015-10-02 DIAGNOSIS — Z880 Allergy status to penicillin: Secondary | ICD-10-CM | POA: Insufficient documentation

## 2015-10-02 DIAGNOSIS — Z5181 Encounter for therapeutic drug level monitoring: Secondary | ICD-10-CM

## 2015-10-02 DIAGNOSIS — Z791 Long term (current) use of non-steroidal anti-inflammatories (NSAID): Secondary | ICD-10-CM | POA: Diagnosis not present

## 2015-10-02 DIAGNOSIS — Z7952 Long term (current) use of systemic steroids: Secondary | ICD-10-CM | POA: Diagnosis not present

## 2015-10-02 DIAGNOSIS — M79604 Pain in right leg: Secondary | ICD-10-CM | POA: Diagnosis present

## 2015-10-02 DIAGNOSIS — Z950 Presence of cardiac pacemaker: Secondary | ICD-10-CM

## 2015-10-02 DIAGNOSIS — Z91040 Latex allergy status: Secondary | ICD-10-CM | POA: Diagnosis not present

## 2015-10-02 LAB — POCT INR: INR: 2.9

## 2015-10-02 LAB — BASIC METABOLIC PANEL
ANION GAP: 7 (ref 5–15)
BUN: 31 mg/dL — ABNORMAL HIGH (ref 6–20)
CO2: 36 mmol/L — ABNORMAL HIGH (ref 22–32)
Calcium: 9 mg/dL (ref 8.9–10.3)
Chloride: 97 mmol/L — ABNORMAL LOW (ref 101–111)
Creatinine, Ser: 1.07 mg/dL — ABNORMAL HIGH (ref 0.44–1.00)
GFR, EST AFRICAN AMERICAN: 51 mL/min — AB (ref >60)
GFR, EST NON AFRICAN AMERICAN: 44 mL/min — AB (ref >60)
GLUCOSE: 175 mg/dL — AB (ref 65–99)
POTASSIUM: 3.8 mmol/L (ref 3.5–5.1)
Sodium: 140 mmol/L (ref 135–145)

## 2015-10-02 LAB — CBC
HEMATOCRIT: 46 % (ref 35.0–47.0)
HEMOGLOBIN: 14.7 g/dL (ref 12.0–16.0)
MCH: 29 pg (ref 26.0–34.0)
MCHC: 32.1 g/dL (ref 32.0–36.0)
MCV: 90.3 fL (ref 80.0–100.0)
Platelets: 123 10*3/uL — ABNORMAL LOW (ref 150–440)
RBC: 5.09 MIL/uL (ref 3.80–5.20)
RDW: 16.2 % — ABNORMAL HIGH (ref 11.5–14.5)
WBC: 10.9 10*3/uL (ref 3.6–11.0)

## 2015-10-02 MED ORDER — CLINDAMYCIN PHOSPHATE 600 MG/50ML IV SOLN
600.0000 mg | Freq: Once | INTRAVENOUS | Status: AC
  Administered 2015-10-02: 600 mg via INTRAVENOUS
  Filled 2015-10-02: qty 50

## 2015-10-02 MED ORDER — CLINDAMYCIN HCL 300 MG PO CAPS: 300.0000 mg | ORAL_CAPSULE | Freq: Three times a day (TID) | ORAL | Status: DC

## 2015-10-02 MED ORDER — CEFAZOLIN SODIUM 1-5 GM-% IV SOLN: 1.0000 g | Freq: Once | INTRAVENOUS | Status: DC

## 2015-10-02 MED ORDER — VANCOMYCIN HCL IN DEXTROSE 1-5 GM/200ML-% IV SOLN: 1000.0000 mg | Freq: Once | INTRAVENOUS | Status: DC

## 2015-10-02 MED ORDER — CLINDAMYCIN HCL 150 MG PO CAPS
300.0000 mg | ORAL_CAPSULE | Freq: Once | ORAL | Status: DC
  Filled 2015-10-02: qty 2

## 2015-10-02 NOTE — Discharge Instructions (Signed)

## 2015-10-02 NOTE — Patient Instructions (Signed)
Medication Instructions: - no changes   Labwork: - none  Procedures/Testing: - none  Follow-Up: - Remote monitoring is used to monitor your Pacemaker of ICD from home. This monitoring reduces the number of office visits required to check your device to one time per year. It allows us to keep an eye on the functioning of your device to ensure it is working properly. You are scheduled for a device check from home on 01/01/16. You may send your transmission at any time that day. If you have a wireless device, the transmission will be sent automatically. After your physician reviews your transmission, you will receive a postcard with your next transmission date.  - Your physician wants you to follow-up in: 1 year with Dr. Klein. You will receive a reminder letter in the mail two months in advance. If you don't receive a letter, please call our office to schedule the follow-up appointment.  Any Additional Special Instructions Will Be Listed Below (If Applicable).   

## 2015-10-02 NOTE — Progress Notes (Signed)
Patient Care Team: Jodi Marble, MD as PCP - General (Internal Medicine) Minna Merritts, MD (Cardiology) Franchot Gallo, MD as Attending Physician (Urology) Frederik Pear, MD (Orthopedic Surgery) Deboraha Sprang, MD as Consulting Physician (Cardiology)   HPI  [name] [name] is a 81 y.o. female Seen in followup for permanent pacer implanted in the setting of permanent atrial fibrillation and bradycardia. [name] is also status post bioprosthetic aortic valve replacement. [name] has prior TIA with her INR target 2.5-3.0  [name] has had past treated with when necessary diuretics  [name] notes that there is good pain in her right leg overnight. There is overlying erythema and warmth which is spread rapidly. I have called her PCP. He says that [name]'s had recurring problems with "infected blisters."  [name]'s also had complaints of dizziness which follows her a.m. medicines. Her breakfast consists of a protein shake which is cold. Dr. Deidre Ala moved her atenolol from morning until night without improvement.  Echocardiogram in February 2012 showed ejection fraction 50-55%, pulmonary pressures 46 mm of mercury; mild aortic stenosis with intact aortic valve, bioprosthetic valve, otherwise normal study.       Past Medical History  Diagnosis Date  . Permanent atrial fibrillation ([place])     a. Chronic coumadin.  . Tachy-brady syndrome ([place])     a. Severe in the setting of permanent atrial fibrillation with tachybrady syndrome;  b. 08/2007 s/p MDT Adapta ADSR01 DC PPM.  . S/P placement of cardiac pacemaker     a.  08/2007 s/p MDT [place] PPM.  . Brain aneurysm     Hx of  . Hypertension   . Thoracic aneurysm     a. 10/2011 CTA: 5.0 x 3.8 cm.  [name] AAA (abdominal aortic aneurysm) ([place])     a. 12/2011 U/S: 3.3 cm prox AAA.  . S/P aortic valve replacement with bioprosthetic valve     a. [facility].  [name] Aortic insufficiency     a. S/P bioprosthetic AVR;  b. 02/2013 Echo: EF 50-55%, mild to mod AI  (mean grad 66mmHg).  . Chronic diastolic CHF (congestive heart failure) ([place])     a. 02/2013 Echo: EF 50-55%.  . Diverticulosis   . Hemorrhoids   . Gastritis   . Hepatitis B 1980's  . Skin cancer     face, ear  and left hand.  . Seizures ([place])   . Moderate mitral regurgitation     a. 02/2013 Echo: Mod MR.  . Moderate tricuspid regurgitation     a. 02/2013 Echo: Mod TR.  [name] Pulmonary hypertension ([place])     a. 02/2013 Echo: PASP 59mmHg.  . Chest pain     a. H/o nonobs cath;  b. 07/2013 Lexiscan MV: EF 66%, no ischemia.  . Shingles     Past Surgical History  Procedure Laterality Date  . Cerebral aneurysm repair    . Abdominal aortic aneurysm repair      Resection  . Aortic valve replacement      With bioprosthetic valve  . Pacemaker placement  08/2007    Medtronic; AF permanent; s/p pacemaker for bradycardia with now well controlled ventricular response; Digoxin therapy, question level; Lower extremity venous issues  . Breast biopsy    . Cholecystectomy    . Vesicovaginal fistula closure w/ tah    . Appendectomy    . Lung biopsy  2005  . Skin cancer removal      ear, face and hand    Current Outpatient Prescriptions  Medication Sig Dispense Refill  . atenolol (TENORMIN) 100 MG tablet Take 100 mg by mouth daily.    . diclofenac sodium (VOLTAREN) 1 % GEL Apply 4 g topically 2 (two) times daily.    . Hydrocodone-Acetaminophen 2.5-325 MG TABS Take 1 tablet by mouth 2 (two) times daily.    [name] latanoprost (XALATAN) 0.005 % ophthalmic solution Place 1 drop into both eyes at bedtime.    [name] LORazepam (ATIVAN) 0.5 MG tablet Take 0.5 mg by mouth 3 (three) times daily as needed for anxiety.     . mirtazapine (REMERON) 7.5 MG tablet Take 7.5 mg by mouth at bedtime.    . Multiple Vitamins-Minerals (EYE VITAMINS) CAPS Take 1 capsule by mouth daily.     . pantoprazole (PROTONIX) 40 MG tablet Take 40 mg by mouth daily.    . potassium chloride (K-DUR,KLOR-CON) 10 MEQ tablet Take 20 mEq by mouth  daily.    . predniSONE (DELTASONE) 20 MG tablet Take 20 mg by mouth daily with breakfast.    . torsemide (DEMADEX) 20 MG tablet TAKE 1 TABLET BY MOUTH DAILY 30 tablet 3  . traZODone (DESYREL) 50 MG tablet Take 50 mg by mouth at bedtime.     [name] warfarin (COUMADIN) 5 MG tablet TAKE AS DIRECTED BY ANTICOAGULATION CLINIC 30 tablet 2   No current facility-administered medications for this visit.    Allergies  Allergen Reactions  . Fluticasone-Salmeterol Anaphylaxis  . Furosemide Shortness Of Breath  . Iodine Other (See Comments)    FEVER AND CHILLS  . Iohexol Other (See Comments)     Desc: patient requires a 13 hr prep, when doing exam with contrast,,,,,fever; chills; dyspnea(slg/04/27/09--given 2 hour solumedrol/ benadryl prep--no reaction)   . Sulfonamide Derivatives Nausea And Vomiting  . Verapamil Nausea Only and Other (See Comments)    LOSS OF APPETITE  . Latex Rash  . Penicillins Rash    Review of Systems negative except from HPI and PMH  Physical Exam BP 150/80 mmHg  Pulse 71  Ht 5\' 3"  (1.6 m)  Wt 139 lb 4 oz (63.163 kg)  BMI 24.67 kg/m2 Well developed and well nourished in no acute distress HENT normal E scleral and icterus clear Neck Supple Clear to ausculation  Regular rate and rhythm, no murmurs gallops or rub Soft with active bowel sounds; mild distention No clubbing cyanosis 1+ Edema Alert and oriented, grossly normal motor and sensory function Skin Warm and Dry there is significant erythema and warmth over her right lateral leg.    Assessment and  Plan  HFpEF  Atrial fibrillation-permanent The patient's device was interrogated.  The information was reviewed. No changes were made in the programming.    Pacemaker-Medtronic  Hypertension  Dizziness  Cellulitis  Euvolemic continue current meds  The erythema appears to be a cellulitis; there are a couple of small healed lesions which might be the source. I've spoken with her PCP and had the other  doctors in the office review this. All are concerned that [name] probably needs intravenous antibiotics.  Her last INR was therapeutic today. It makes DVT exceedingly unlikely.  As relates to her a.m. dizziness, this is post prandial as well as with her medications. The consistency of her meal, i.e. a protein shake which is cold, makes it unlikely that this is postprandial hypotension caused by food although this needs to be considered. Alternatively could be medication and I suggested that [name] take her medications and move them serially away from her breakfast to see  if [name] can pinpoint the culprit.  In the event that it is not a medication, then we will return to the idea postprandial hypotension; this can be difficult to diagnose but is probably more easily dealt with by empiric only having her take her protein shake while seated in a chair and just hanging out there for an hour or so  More than 50% of 45 min was spent in counseling related to the above

## 2015-10-02 NOTE — Consult Note (Signed)
[facility] [place] [place] NAME: [name]    MR#:  [id number]  DATE OF BIRTH:  1922-05-13  DATE OF ADMISSION:  10/02/2015  PRIMARY CARE PHYSICIAN: [name], MD   REQUESTING/REFERRING PHYSICIAN: Dr Cinda Quest  CHIEF COMPLAINT:   Chief Complaint  Patient presents with  . Leg Pain    HISTORY OF PRESENT ILLNESS:  [name]  is a 81 y.o. female with a known history of atrial fibrillation, tachybradycardia syndrome status post cardiac pacemaker in [id number], chronic diastolic congestive heart failure considered the emergency room from Dr. Olin Pia office concerning right leg cellulitis. Patient denies any trauma or any skin breakdown. She does have a tiny little blister around the cellulitis. No fever nausea vomiting. Did have some leg pain. Her lower extremity ultrasound of the right is negative for DVT. Patient is afebrile and rest of her vitals are normal. She's order to get clindamycin.  PAST MEDICAL HISTORY:   Past Medical History  Diagnosis Date  . Permanent atrial fibrillation ([place])     a. Chronic coumadin.  . Tachy-brady syndrome ([place])     a. Severe in the setting of permanent atrial fibrillation with tachybrady syndrome;  b. 08/2007 s/p MDT Adapta ADSR01 DC PPM.  . S/P placement of cardiac pacemaker     a.  08/2007 s/p MDT [place] PPM.  . Brain aneurysm     Hx of  . Hypertension   . Thoracic aneurysm     a. 10/2011 CTA: 5.0 x 3.8 cm.  [name] AAA (abdominal aortic aneurysm) ([place])     a. 12/2011 U/S: 3.3 cm prox AAA.  . S/P aortic valve replacement with bioprosthetic valve     a. [facility].  [name] Aortic insufficiency     a. S/P bioprosthetic AVR;  b. 02/2013 Echo: EF 50-55%, mild to mod AI (mean grad 63mmHg).  . Chronic diastolic CHF (congestive heart failure) ([place])     a. 02/2013 Echo: EF 50-55%.  . Diverticulosis   . Hemorrhoids   . Gastritis   . Hepatitis B 1980's  . Skin cancer     face, ear   and left hand.  . Seizures ([place])   . Moderate mitral regurgitation     a. 02/2013 Echo: Mod MR.  . Moderate tricuspid regurgitation     a. 02/2013 Echo: Mod TR.  [name] Pulmonary hypertension ([place])     a. 02/2013 Echo: PASP 42mmHg.  . Chest pain     a. H/o nonobs cath;  b. 07/2013 Lexiscan MV: EF 66%, no ischemia.  . Shingles     PAST SURGICAL HISTOIRY:   Past Surgical History  Procedure Laterality Date  . Cerebral aneurysm repair    . Abdominal aortic aneurysm repair      Resection  . Aortic valve replacement      With bioprosthetic valve  . Pacemaker placement  08/2007    Medtronic; AF permanent; s/p pacemaker for bradycardia with now well controlled ventricular response; Digoxin therapy, question level; Lower extremity venous issues  . Breast biopsy    . Cholecystectomy    . Vesicovaginal fistula closure w/ tah    . Appendectomy    . Lung biopsy  2005  . Skin cancer removal      ear, face and hand    SOCIAL HISTORY:   Social History  Substance Use Topics  . Smoking status: Never Smoker   . Smokeless tobacco: Not on file  . Alcohol  Use: No    FAMILY HISTORY:   Family History  Problem Relation Age of Onset  . Cancer Mother   . Heart disease Mother   . Hypertension Mother   . Varicose Veins Mother   . Heart disease Father   . Hypertension Father   . Heart disease Sister   . Hypertension Sister   . Varicose Veins Sister   . AAA (abdominal aortic aneurysm) Sister   . Heart disease Brother   . Hypertension Brother     DRUG ALLERGIES:   Allergies  Allergen Reactions  . Fluticasone-Salmeterol Anaphylaxis  . Furosemide Shortness Of Breath  . Iodine Other (See Comments)    Reaction:  Fever and chills   . Iohexol Other (See Comments)     Desc: patient requires a 13 hr prep, when doing exam with contrast,,,,,fever; chills; dyspnea(slg/04/27/09--given 2 hour solumedrol/ benadryl prep--no reaction)   . Sulfonamide Derivatives Nausea And Vomiting  . Verapamil Nausea  Only and Other (See Comments)    LOSS OF APPETITE  . Latex Rash  . Penicillins Rash    REVIEW OF SYSTEMS:   Review of Systems  Constitutional: Negative for fever, chills and weight loss.  HENT: Negative for ear discharge, ear pain and nosebleeds.   Eyes: Negative for blurred vision, pain and discharge.  Respiratory: Negative for sputum production, shortness of breath, wheezing and stridor.   Cardiovascular: Negative for chest pain, palpitations, orthopnea and PND.  Gastrointestinal: Negative for nausea, vomiting, abdominal pain and diarrhea.  Genitourinary: Negative for urgency and frequency.  Musculoskeletal: Positive for back pain and joint pain.  Skin: Positive for rash.  Neurological: Negative for sensory change, speech change, focal weakness and weakness.  Psychiatric/Behavioral: Negative for depression and hallucinations. The patient is not nervous/anxious.      MEDICATIONS AT HOME:   Prior to Admission medications   Medication Sig Start Date End Date Taking? Authorizing Provider  atenolol (TENORMIN) 100 MG tablet Take 100 mg by mouth at bedtime.    Yes Historical Provider, MD  diclofenac sodium (VOLTAREN) 1 % GEL Apply 4 g topically 2 (two) times daily.   Yes Historical Provider, MD  HYDROcodone-acetaminophen (NORCO/VICODIN) 5-325 MG tablet Take 1 tablet by mouth every 6 (six) hours as needed for moderate pain.   Yes Historical Provider, MD  latanoprost (XALATAN) 0.005 % ophthalmic solution Place 1 drop into both eyes at bedtime.   Yes Historical Provider, MD  LORazepam (ATIVAN) 0.5 MG tablet Take 0.5 mg by mouth 3 (three) times daily as needed for anxiety.    Yes Historical Provider, MD  mirtazapine (REMERON) 7.5 MG tablet Take 7.5 mg by mouth at bedtime.   Yes Historical Provider, MD  multivitamin-lutein (OCUVITE-LUTEIN) CAPS capsule Take 1 capsule by mouth daily.   Yes Historical Provider, MD  pantoprazole (PROTONIX) 40 MG tablet Take 40 mg by mouth daily.   Yes Historical  Provider, MD  potassium chloride SA (K-DUR,KLOR-CON) 20 MEQ tablet Take 20 mEq by mouth every evening.   Yes Historical Provider, MD  predniSONE (DELTASONE) 20 MG tablet Take 20 mg by mouth daily.    Yes Historical Provider, MD  torsemide (DEMADEX) 20 MG tablet Take 20 mg by mouth daily.   Yes Historical Provider, MD  traZODone (DESYREL) 50 MG tablet Take 50 mg by mouth at bedtime.    Yes Historical Provider, MD  warfarin (COUMADIN) 5 MG tablet Take 2.5-5 mg by mouth every evening. Pt takes one tablet on Monday and Friday and one-half tablet all other  days.   Yes Historical Provider, MD      VITAL SIGNS:  Blood pressure 155/53, pulse 61, temperature 97.6 F (36.4 C), temperature source Oral, resp. rate 20, height 5\' 3"  (1.6 m), weight 63.05 kg (139 lb).  PHYSICAL EXAMINATION:  GENERAL:  81 y.o.-year-old patient lying in the bed with no acute distress.  EYES: Pupils equal, round, reactive to light and accommodation. No scleral icterus. Extraocular muscles intact.  HEENT: Head atraumatic, normocephalic. Oropharynx and nasopharynx clear.  NECK:  Supple, no jugular venous distention. No thyroid enlargement, no tenderness.  LUNGS: Normal breath sounds bilaterally, no wheezing, rales,rhonchi or crepitation. No use of accessory muscles of respiration.  CARDIOVASCULAR: S1, S2 normal. No murmurs, rubs, or gallops.  ABDOMEN: Soft, nontender, nondistended. Bowel sounds present. No organomegaly or mass.  EXTREMITIES: No pedal edema, cyanosis, or clubbing. Small are of 3-4 cm redness with mild warmth to touch over the lateral aspect of right leg,superficial NEUROLOGIC: Cranial nerves II through XII are intact. Muscle strength 5/5 in all extremities. Sensation intact. Gait not checked.  PSYCHIATRIC:  patient is alert and oriented x 3.  SKIN: No obvious rash, lesion, or ulcer.   LABORATORY PANEL:   CBC  Recent Labs Lab 10/02/15 1358  WBC 10.9  HGB 14.7  HCT 46.0  PLT 123*    ------------------------------------------------------------------------------------------------------------------  Chemistries   Recent Labs Lab 10/02/15 1358  NA 140  K 3.8  CL 97*  CO2 36*  GLUCOSE 175*  BUN 31*  CREATININE 1.07*  CALCIUM 9.0   ------------------------------------------------------------------------------------------------------------------  Cardiac Enzymes No results for input(s): TROPONINI in the last 168 hours. ------------------------------------------------------------------------------------------------------------------  RADIOLOGY:  US Venous Img Lower Unilateral Right  10/02/2015  CLINICAL DATA:  Redness, swelling, PAIN.  VARICOSE/SPIDER VEINS. EXAM: RIGHT LOWER EXTREMITY VENOUS DOPPLER ULTRASOUND TECHNIQUE: Gray-scale sonography with compression, as well as color and duplex ultrasound, were performed to evaluate the deep venous system from the level of the common femoral vein through the popliteal and proximal calf veins. COMPARISON:  07/26/2012 FINDINGS: Normal compressibility of the common femoral, superficial femoral, and popliteal veins, as well as the proximal calf veins. No filling defects to suggest DVT on grayscale or color Doppler imaging. Doppler waveforms show normal direction of venous flow, normal respiratory phasicity and response to augmentation. Elongated 54 x 9 x 18 mm hypoechoic collection in the posterior popliteal fossa. Survey views of the contralateral common femoral vein are unremarkable. IMPRESSION: 1. No evidence of lower extremity deep vein thrombosis, RIGHT. 2. Small right Baker cyst. Electronically Signed   By: Lucrezia Europe M.D.   On: 10/02/2015 16:38    EKG:   electronic PM  IMPRESSION AND PLAN:  [name]  is a 81 y.o. female with a known history of atrial fibrillation, tachybradycardia syndrome status post cardiac pacemaker in [id number], chronic diastolic congestive heart failure considered the emergency room from Dr.  Olin Pia office concerning right leg cellulitis.  1. Right lower extremity cellulitis superficial Patient has no fever white count is normal. Lower extremity Dopplers negative for DVT. Spoke with patient's daughter and patient to take clindamycin ER physician's recommendation at home and follow-up with primary care physician as outpatient. Watch for fever, increasing redness was explained to patient's daughter.  2. History of atrial fibrillation/tachybradycardia syndrome status post pacemaker  3. Polymyalgia rheumatica patient is on prednisone  4. History of A. fib on Coumadin  All the records are reviewed and case discussed with Consulting provider. Management plans discussed with the patient, family and they are in  agreement.  CODE STATUS: Full  TOTAL TIME TAKING CARE OF THIS PATIENT: 45 minutes.    Tor Tsuda M.D on 10/02/2015 at 5:21 PM  Between 7am to 6pm - Pager - [phone]  After 6pm go to [email or web address] - password [email or web address]  Office  [phone]  CC: Primary care Physician: [name], MD

## 2015-10-02 NOTE — ED Notes (Signed)
Pt to ed with c/o right leg pain, swelling and redness.  Sent from dr Aquilla Hacker office for ? Cellulitis.

## 2015-10-02 NOTE — ED Provider Notes (Signed)
[facility] Emergency Department Provider Note  ____________________________________________  Time seen: Approximately 4:45 PM  I have reviewed the triage vital signs and the nursing notes.   HISTORY  Chief Complaint Leg Pain   HPI [name] is a 81 y.o. female patient sent from Dr. Olin Pia office with what he thinks the cellulitis. She reports she had a bruise on her right lateral leg yesterday.  He had been painful all night and this morning became red to an course of day it increased in size and painfulness. Patient does not think she had a fever today. Ultrasound did not find DVT in the leg today in the emergency room. The leg is painful and swollen warm and red. And again is increased in size during the course of today. Pain is moderate in nature  Past Medical History  Diagnosis Date  . Permanent atrial fibrillation ([place])     a. Chronic coumadin.  . Tachy-brady syndrome ([place])     a. Severe in the setting of permanent atrial fibrillation with tachybrady syndrome;  b. 08/2007 s/p MDT Adapta ADSR01 DC PPM.  . S/P placement of cardiac pacemaker     a.  08/2007 s/p MDT [place] PPM.  . Brain aneurysm     Hx of  . Hypertension   . Thoracic aneurysm     a. 10/2011 CTA: 5.0 x 3.8 cm.  [name] AAA (abdominal aortic aneurysm) ([place])     a. 12/2011 U/S: 3.3 cm prox AAA.  . S/P aortic valve replacement with bioprosthetic valve     a. [facility].  [name] Aortic insufficiency     a. S/P bioprosthetic AVR;  b. 02/2013 Echo: EF 50-55%, mild to mod AI (mean grad 59mmHg).  . Chronic diastolic CHF (congestive heart failure) ([place])     a. 02/2013 Echo: EF 50-55%.  . Diverticulosis   . Hemorrhoids   . Gastritis   . Hepatitis B 1980's  . Skin cancer     face, ear  and left hand.  . Seizures ([place])   . Moderate mitral regurgitation     a. 02/2013 Echo: Mod MR.  . Moderate tricuspid regurgitation     a. 02/2013 Echo: Mod TR.  [name] Pulmonary hypertension ([place])    a. 02/2013 Echo: PASP 50mmHg.  . Chest pain     a. H/o nonobs cath;  b. 07/2013 Lexiscan MV: EF 66%, no ischemia.  . Shingles     Patient Active Problem List   Diagnosis Date Noted  . Pain in the muscles 08/31/2015  . S/P AAA repair 03/27/2015  . Tachy-brady syndrome ([place]) 03/27/2015  . Constipation 03/27/2015  . Dizziness 11/01/2014  . Pneumonia, organism unspecified 11/01/2014  . Fatigue 02/27/2014  . Pulmonary hypertension ([place]) 01/26/2014  . Encounter for therapeutic drug monitoring 12/15/2013  . Bronchitis 12/15/2013  . Frequent PVCs 12/15/2013  . Exertional angina ([place]) 07/07/2013  . Bradycardia 07/07/2013  . Ulcer of other part of lower limb 12/28/2012  . HYPERLIPIDEMIA-MIXED 12/26/2010  . TIA 12/26/2010  . PACEMAKER-Medtronic 05/07/2010  . S/P aortic valve replacement with bioprosthetic valve 01/15/2010  . MURMUR 01/15/2010  . DIASTOLIC HEART FAILURE, CHRONIC 10/26/2009  . AAA (abdominal aortic aneurysm) ([place]) 07/17/2009  . PVD 07/17/2009  . ATRIAL FIBRILLATION 06/19/2009  . DIZZINESS AND GIDDINESS 06/19/2009    Past Surgical History  Procedure Laterality Date  . Cerebral aneurysm repair    . Abdominal aortic aneurysm repair      Resection  . Aortic valve  replacement      With bioprosthetic valve  . Pacemaker placement  08/2007    Medtronic; AF permanent; s/p pacemaker for bradycardia with now well controlled ventricular response; Digoxin therapy, question level; Lower extremity venous issues  . Breast biopsy    . Cholecystectomy    . Vesicovaginal fistula closure w/ tah    . Appendectomy    . Lung biopsy  2005  . Skin cancer removal      ear, face and hand    Current Outpatient Rx  Name  Route  Sig  Dispense  Refill  . atenolol (TENORMIN) 100 MG tablet   Oral   Take 100 mg by mouth at bedtime.          . diclofenac sodium (VOLTAREN) 1 % GEL   Topical   Apply 4 g topically 2 (two) times daily.         [name] HYDROcodone-acetaminophen (NORCO/VICODIN)  5-325 MG tablet   Oral   Take 1 tablet by mouth every 6 (six) hours as needed for moderate pain.         [name] latanoprost (XALATAN) 0.005 % ophthalmic solution   Both Eyes   Place 1 drop into both eyes at bedtime.         [name] LORazepam (ATIVAN) 0.5 MG tablet   Oral   Take 0.5 mg by mouth 3 (three) times daily as needed for anxiety.          . mirtazapine (REMERON) 7.5 MG tablet   Oral   Take 7.5 mg by mouth at bedtime.         . multivitamin-lutein (OCUVITE-LUTEIN) CAPS capsule   Oral   Take 1 capsule by mouth daily.         . pantoprazole (PROTONIX) 40 MG tablet   Oral   Take 40 mg by mouth daily.         . potassium chloride SA (K-DUR,KLOR-CON) 20 MEQ tablet   Oral   Take 20 mEq by mouth every evening.         . predniSONE (DELTASONE) 20 MG tablet   Oral   Take 20 mg by mouth daily.          [name] torsemide (DEMADEX) 20 MG tablet   Oral   Take 20 mg by mouth daily.         . traZODone (DESYREL) 50 MG tablet   Oral   Take 50 mg by mouth at bedtime.          [name] warfarin (COUMADIN) 5 MG tablet   Oral   Take 2.5-5 mg by mouth every evening. Pt takes one tablet on Monday and Friday and one-half tablet all other days.         . clindamycin (CLEOCIN) 300 MG capsule   Oral   Take 1 capsule (300 mg total) by mouth 3 (three) times daily.   30 capsule   0     Allergies Fluticasone-salmeterol; Furosemide; Iodine; Iohexol; Sulfonamide derivatives; Verapamil; Latex; and Penicillins  Family History  Problem Relation Age of Onset  . Cancer Mother   . Heart disease Mother   . Hypertension Mother   . Varicose Veins Mother   . Heart disease Father   . Hypertension Father   . Heart disease Sister   . Hypertension Sister   . Varicose Veins Sister   . AAA (abdominal aortic aneurysm) Sister   . Heart disease Brother   . Hypertension Brother     Social  History Social History  Substance Use Topics  . Smoking status: Never Smoker   . Smokeless tobacco:  None  . Alcohol Use: No    Review of Systems Constitutional: No fever/chills Eyes: No visual changes. ENT: No sore throat. Cardiovascular: Denies chest pain. Respiratory: Denies shortness of breath. Gastrointestinal: No abdominal pain.  No nausea, no vomiting.  No diarrhea.  No constipation. Genitourinary: Negative for dysuria. Musculoskeletal: Negative for back pain. Skin: Negative for rash. Neurological: Negative for headaches, focal weakness or numbness.  10-point ROS otherwise negative.  ____________________________________________   PHYSICAL EXAM:  VITAL SIGNS: ED Triage Vitals  Enc Vitals Group     BP 10/02/15 1344 155/53 mmHg     Pulse Rate 10/02/15 1344 61     Resp 10/02/15 1344 20     Temp 10/02/15 1344 97.6 F (36.4 C)     Temp Source 10/02/15 1344 Oral     SpO2 --      Weight 10/02/15 1344 139 lb (63.05 kg)     Height 10/02/15 1344 5\' 3"  (1.6 m)     Head Cir --      Peak Flow --      Pain Score 10/02/15 1345 7     Pain Loc --      Pain Edu? --      Excl. in [place]? --     Constitutional: Alert and oriented. Well appearing and in no acute distress. Eyes: Conjunctivae are normal. PERRL. EOMI. Head: Atraumatic. Nose: No congestion/rhinnorhea. Mouth/Throat: Mucous membranes are moist.  Oropharynx non-erythematous. Neck: No stridor. Cardiovascular: Normal rate, regular rhythm. Grossly normal heart sounds.  Good peripheral circulation. Respiratory: Normal respiratory effort.  No retractions. Lungs CTAB. Gastrointestinal: Soft and nontender. No distention. No abdominal bruits. No CVA tenderness. Musculoskeletal: No lower extremity tenderness nor edema except for the right leg which has a spot that approximately 4 x 10 cm in size red warm swollen and tender..  No joint effusions. Neurologic:  Normal speech and language. No gross focal neurologic deficits are appreciated. No gait instability. Skin:  Skin is warm, dry and intact. No rash noted. See above  musculoskeletal Psychiatric: Mood and affect are normal. Speech and behavior are normal.  ____________________________________________   LABS (all labs ordered are listed, but only abnormal results are displayed)  Labs Reviewed  BASIC METABOLIC PANEL - Abnormal; Notable for the following:    Chloride 97 (*)    CO2 36 (*)    Glucose, Bld 175 (*)    BUN 31 (*)    Creatinine, Ser 1.07 (*)    GFR calc non Af Amer 44 (*)    GFR calc Af Amer 51 (*)    All other components within normal limits  CBC - Abnormal; Notable for the following:    RDW 16.2 (*)    Platelets 123 (*)    All other components within normal limits  CBG MONITORING, ED   ____________________________________________  EKG  EKG fully paced at a rate of 64 ____________________________________________  RADIOLOGY  Ultrasound reported as no DVT   PROCEDURES   ____________________________________________   INITIAL IMPRESSION / ASSESSMENT AND PLAN / ED COURSE  Pertinent labs & imaging results that were available during my care of the patient were reviewed by me and considered in my medical decision making (see chart for details).   ____________________________________________   FINAL CLINICAL IMPRESSION(S) / ED DIAGNOSES  Final diagnoses:  Cellulitis of right lower extremity      Nena Polio, MD 10/03/15  0003 

## 2015-10-03 LAB — CUP PACEART INCLINIC DEVICE CHECK
Date Time Interrogation Session: 20161116102909
Implantable Lead Location: 753860
Lead Channel Setting Pacing Amplitude: 2 V
Lead Channel Setting Sensing Sensitivity: 5.6 mV
MDC IDC LEAD IMPLANT DT: 20081003
MDC IDC SET LEADCHNL RV PACING PULSEWIDTH: 0.4 ms

## 2015-10-17 ENCOUNTER — Ambulatory Visit (INDEPENDENT_AMBULATORY_CARE_PROVIDER_SITE_OTHER): Payer: Medicare Other | Admitting: *Deleted

## 2015-10-17 DIAGNOSIS — I359 Nonrheumatic aortic valve disorder, unspecified: Secondary | ICD-10-CM

## 2015-10-17 DIAGNOSIS — Z954 Presence of other heart-valve replacement: Secondary | ICD-10-CM

## 2015-10-17 DIAGNOSIS — G459 Transient cerebral ischemic attack, unspecified: Secondary | ICD-10-CM | POA: Diagnosis not present

## 2015-10-17 DIAGNOSIS — Z5181 Encounter for therapeutic drug level monitoring: Secondary | ICD-10-CM

## 2015-10-17 DIAGNOSIS — Z953 Presence of xenogenic heart valve: Secondary | ICD-10-CM

## 2015-10-17 DIAGNOSIS — I4891 Unspecified atrial fibrillation: Secondary | ICD-10-CM

## 2015-10-17 LAB — POCT INR: INR: 2.8

## 2015-10-22 ENCOUNTER — Encounter: Payer: Self-pay | Admitting: Internal Medicine

## 2015-11-07 ENCOUNTER — Ambulatory Visit (INDEPENDENT_AMBULATORY_CARE_PROVIDER_SITE_OTHER): Payer: Medicare Other

## 2015-11-07 DIAGNOSIS — Z5181 Encounter for therapeutic drug level monitoring: Secondary | ICD-10-CM

## 2015-11-07 DIAGNOSIS — Z954 Presence of other heart-valve replacement: Secondary | ICD-10-CM | POA: Diagnosis not present

## 2015-11-07 DIAGNOSIS — I4891 Unspecified atrial fibrillation: Secondary | ICD-10-CM | POA: Diagnosis not present

## 2015-11-07 DIAGNOSIS — G459 Transient cerebral ischemic attack, unspecified: Secondary | ICD-10-CM

## 2015-11-07 DIAGNOSIS — I359 Nonrheumatic aortic valve disorder, unspecified: Secondary | ICD-10-CM

## 2015-11-07 DIAGNOSIS — Z953 Presence of xenogenic heart valve: Secondary | ICD-10-CM

## 2015-11-07 LAB — POCT INR: INR: 2.7

## 2015-11-29 ENCOUNTER — Telehealth: Payer: Self-pay | Admitting: *Deleted

## 2015-11-29 NOTE — Telephone Encounter (Signed)
Pt c/o swelling: STAT is pt has developed SOB within 24 hours  1. How long have you been experiencing swelling? Couple of days  2. Where is the swelling located? Not sure  3.  Are you currently taking a "fluid pill"? Yes and doubled up the last couple days on torsemide (DEMADEX) 20 MG tablet    4.  Are you currently SOB? yes  5.  Have you traveled recently? No  Please call [name].

## 2015-11-29 NOTE — Telephone Encounter (Signed)
Spoke w/ Joelene Millin.  She reports that pt has developed swelling in her ankles and had wt gain (she did not provide a #) over the past couple of days. She reports that pt does not add salt to her diet but has been drinking more. Discussed sources of sodium and the reasons for fluid restriction. She is agreeable to advising pt to drink only when she is thirsty and to provide her w/ ice chips. Pt does have compression hose; she will have pt wear hose and keep her feet elevated when sitting. Advised her not to have pt take extra torsemide tomorrow, but not for more than 3 days. Asked her to call back if sx do not improve and to call the office over the weekend and the PA on call can assist her.  She is appreciative and will call back if we can be of further assistance.

## 2015-12-05 ENCOUNTER — Ambulatory Visit (INDEPENDENT_AMBULATORY_CARE_PROVIDER_SITE_OTHER): Payer: Medicare Other | Admitting: *Deleted

## 2015-12-05 DIAGNOSIS — I359 Nonrheumatic aortic valve disorder, unspecified: Secondary | ICD-10-CM | POA: Diagnosis not present

## 2015-12-05 DIAGNOSIS — I4891 Unspecified atrial fibrillation: Secondary | ICD-10-CM | POA: Diagnosis not present

## 2015-12-05 DIAGNOSIS — Z5181 Encounter for therapeutic drug level monitoring: Secondary | ICD-10-CM | POA: Diagnosis not present

## 2015-12-05 DIAGNOSIS — G459 Transient cerebral ischemic attack, unspecified: Secondary | ICD-10-CM

## 2015-12-05 DIAGNOSIS — Z954 Presence of other heart-valve replacement: Secondary | ICD-10-CM

## 2015-12-05 DIAGNOSIS — Z953 Presence of xenogenic heart valve: Secondary | ICD-10-CM

## 2015-12-05 LAB — POCT INR: INR: 3.5

## 2015-12-07 ENCOUNTER — Other Ambulatory Visit: Payer: Self-pay | Admitting: Cardiovascular Disease

## 2015-12-07 MED ORDER — POTASSIUM CHLORIDE CRYS ER 20 MEQ PO TBCR: 20.0000 meq | EXTENDED_RELEASE_TABLET | Freq: Every evening | ORAL | Status: DC

## 2016-01-01 ENCOUNTER — Encounter: Payer: Medicare Other | Admitting: *Deleted

## 2016-01-01 ENCOUNTER — Telehealth: Payer: Self-pay | Admitting: Cardiology

## 2016-01-01 NOTE — Telephone Encounter (Signed)
Confirmed remote transmission w/ pt niece.   

## 2016-01-02 ENCOUNTER — Ambulatory Visit (INDEPENDENT_AMBULATORY_CARE_PROVIDER_SITE_OTHER): Payer: Medicare Other

## 2016-01-02 ENCOUNTER — Encounter: Payer: Self-pay | Admitting: Cardiology

## 2016-01-02 DIAGNOSIS — I359 Nonrheumatic aortic valve disorder, unspecified: Secondary | ICD-10-CM | POA: Diagnosis not present

## 2016-01-02 DIAGNOSIS — I4891 Unspecified atrial fibrillation: Secondary | ICD-10-CM | POA: Diagnosis not present

## 2016-01-02 DIAGNOSIS — Z953 Presence of xenogenic heart valve: Secondary | ICD-10-CM

## 2016-01-02 DIAGNOSIS — Z5181 Encounter for therapeutic drug level monitoring: Secondary | ICD-10-CM | POA: Diagnosis not present

## 2016-01-02 DIAGNOSIS — Z954 Presence of other heart-valve replacement: Secondary | ICD-10-CM | POA: Diagnosis not present

## 2016-01-02 DIAGNOSIS — G459 Transient cerebral ischemic attack, unspecified: Secondary | ICD-10-CM

## 2016-01-02 LAB — POCT INR: INR: 2.3

## 2016-01-04 ENCOUNTER — Other Ambulatory Visit: Payer: Self-pay | Admitting: Cardiovascular Disease

## 2016-01-11 ENCOUNTER — Telehealth: Payer: Self-pay | Admitting: Internal Medicine

## 2016-01-11 NOTE — Telephone Encounter (Signed)
Walked through how to send transmission with pt's niece. She is trying to find an area of the house with enough signal to send but feels confident in the process. Will call back if she has further questions.

## 2016-01-11 NOTE — Telephone Encounter (Signed)
Follow Up  Device.   4. Are you calling to see if we received your device transmission? Yes. Please call to discuss how to send the signal

## 2016-01-14 ENCOUNTER — Telehealth: Payer: Self-pay | Admitting: Internal Medicine

## 2016-01-14 ENCOUNTER — Ambulatory Visit (INDEPENDENT_AMBULATORY_CARE_PROVIDER_SITE_OTHER): Payer: Medicare Other | Admitting: *Deleted

## 2016-01-14 DIAGNOSIS — I495 Sick sinus syndrome: Secondary | ICD-10-CM

## 2016-01-14 NOTE — Telephone Encounter (Signed)
Spoke w/ pt niece. Ensured that the home monitor was hooked up correctly. Offered to order pt a new monitor. Pt niece wanted to trouble shoot further and is going to call tech services.

## 2016-01-14 NOTE — Telephone Encounter (Signed)
Can not get the transmission to go through.

## 2016-01-16 NOTE — Progress Notes (Signed)
Remote pacemaker transmission.   

## 2016-01-23 ENCOUNTER — Ambulatory Visit (INDEPENDENT_AMBULATORY_CARE_PROVIDER_SITE_OTHER): Payer: Medicare Other

## 2016-01-23 DIAGNOSIS — Z954 Presence of other heart-valve replacement: Secondary | ICD-10-CM | POA: Diagnosis not present

## 2016-01-23 DIAGNOSIS — I359 Nonrheumatic aortic valve disorder, unspecified: Secondary | ICD-10-CM

## 2016-01-23 DIAGNOSIS — Z5181 Encounter for therapeutic drug level monitoring: Secondary | ICD-10-CM | POA: Diagnosis not present

## 2016-01-23 DIAGNOSIS — G459 Transient cerebral ischemic attack, unspecified: Secondary | ICD-10-CM

## 2016-01-23 DIAGNOSIS — I4891 Unspecified atrial fibrillation: Secondary | ICD-10-CM

## 2016-01-23 DIAGNOSIS — Z953 Presence of xenogenic heart valve: Secondary | ICD-10-CM

## 2016-01-23 LAB — POCT INR: INR: 2.9

## 2016-02-04 ENCOUNTER — Other Ambulatory Visit: Payer: Self-pay | Admitting: Internal Medicine

## 2016-02-05 LAB — CUP PACEART REMOTE DEVICE CHECK
Battery Impedance: 4382 Ohm
Lead Channel Impedance Value: 0 Ohm
Lead Channel Impedance Value: 662 Ohm
Lead Channel Pacing Threshold Pulse Width: 0.4 ms
Lead Channel Setting Pacing Amplitude: 2 V
MDC IDC LEAD IMPLANT DT: 20081003
MDC IDC LEAD LOCATION: 753860
MDC IDC MSMT BATTERY REMAINING LONGEVITY: 12 mo
MDC IDC MSMT BATTERY VOLTAGE: 2.68 V
MDC IDC MSMT LEADCHNL RV PACING THRESHOLD AMPLITUDE: 0.875 V
MDC IDC SESS DTM: 20170227192548
MDC IDC SET LEADCHNL RV PACING PULSEWIDTH: 0.4 ms
MDC IDC SET LEADCHNL RV SENSING SENSITIVITY: 5.6 mV
MDC IDC STAT BRADY RV PERCENT PACED: 99 %

## 2016-02-06 ENCOUNTER — Encounter: Payer: Self-pay | Admitting: Cardiology

## 2016-02-11 ENCOUNTER — Other Ambulatory Visit: Payer: Self-pay | Admitting: Student

## 2016-02-11 DIAGNOSIS — I671 Cerebral aneurysm, nonruptured: Secondary | ICD-10-CM

## 2016-02-11 DIAGNOSIS — K5900 Constipation, unspecified: Secondary | ICD-10-CM

## 2016-02-11 DIAGNOSIS — R110 Nausea: Secondary | ICD-10-CM

## 2016-02-11 DIAGNOSIS — R42 Dizziness and giddiness: Secondary | ICD-10-CM

## 2016-02-12 ENCOUNTER — Encounter: Payer: Medicare Other | Attending: Internal Medicine | Admitting: Internal Medicine

## 2016-02-12 DIAGNOSIS — I482 Chronic atrial fibrillation: Secondary | ICD-10-CM | POA: Diagnosis not present

## 2016-02-12 DIAGNOSIS — Z880 Allergy status to penicillin: Secondary | ICD-10-CM | POA: Diagnosis not present

## 2016-02-12 DIAGNOSIS — Z950 Presence of cardiac pacemaker: Secondary | ICD-10-CM | POA: Diagnosis not present

## 2016-02-12 DIAGNOSIS — L97811 Non-pressure chronic ulcer of other part of right lower leg limited to breakdown of skin: Secondary | ICD-10-CM | POA: Insufficient documentation

## 2016-02-12 DIAGNOSIS — I509 Heart failure, unspecified: Secondary | ICD-10-CM | POA: Diagnosis not present

## 2016-02-12 DIAGNOSIS — Z952 Presence of prosthetic heart valve: Secondary | ICD-10-CM | POA: Insufficient documentation

## 2016-02-12 DIAGNOSIS — I87333 Chronic venous hypertension (idiopathic) with ulcer and inflammation of bilateral lower extremity: Secondary | ICD-10-CM | POA: Diagnosis not present

## 2016-02-12 DIAGNOSIS — C4482 Squamous cell carcinoma of overlapping sites of skin: Secondary | ICD-10-CM | POA: Insufficient documentation

## 2016-02-12 DIAGNOSIS — I714 Abdominal aortic aneurysm, without rupture: Secondary | ICD-10-CM | POA: Insufficient documentation

## 2016-02-12 DIAGNOSIS — L97821 Non-pressure chronic ulcer of other part of left lower leg limited to breakdown of skin: Secondary | ICD-10-CM | POA: Insufficient documentation

## 2016-02-12 DIAGNOSIS — M1990 Unspecified osteoarthritis, unspecified site: Secondary | ICD-10-CM | POA: Insufficient documentation

## 2016-02-12 DIAGNOSIS — Z8673 Personal history of transient ischemic attack (TIA), and cerebral infarction without residual deficits: Secondary | ICD-10-CM | POA: Insufficient documentation

## 2016-02-14 NOTE — Progress Notes (Signed)
[name], [name] ([id number]) Visit Report for 02/12/2016 Allergy List Details Patient Name: [name], [name]. Date of Service: 02/12/2016 2:15 PM Medical Record Patient Account Number: [id number] Number: Treating RN: Ahmed Prima [birth year] (82 y.o. Other Clinician: Date of Birth/Sex: Female) Treating ROBSON, [place] Primary Care Physician: Pernell Dupre Physician/Extender: G Referring Physician: Joylene John in Treatment: 0 Allergies Active Allergies Penicillins Reaction: rash sulfa drugs/derivitives Reaction: n/v iodine dye Reaction: chills; fever latex advair hfa Reaction: anaphylaxis Verapamil Reaction: nausea; loss of appetite furosemide Reaction: SOB Allergy Notes Electronic Signature(s) Signed: 02/13/2016 6:14:28 PM By: Alric Quan Entered By: Alric Quan on 02/12/2016 14:39:16 [name] ([id number]) -------------------------------------------------------------------------------- [place] Details Patient Name: [name] Date of Service: 02/12/2016 2:15 PM Medical Record Patient Account Number: [id number] Number: Treating RN: Ahmed Prima 10-27-1922 (82 y.o. Other Clinician: Date of Birth/Sex: Female) Treating ROBSON, [place] Primary Care Physician: Pernell Dupre Physician/Extender: G Referring Physician: Joylene John in Treatment: 0 Visit Information Patient Arrived: Wheel Chair Arrival Time: 14:28 Accompanied By: neice Transfer Assistance: EasyPivot Patient Lift Patient Identification Verified: Yes Secondary Verification Process Yes Completed: Patient Requires Transmission- No Based Precautions: Patient Has Alerts: Yes Patient Alerts: Patient on Blood Thinner Coumadin History Since Last Visit All ordered tests and consults were completed: No Added or deleted any medications: No Any new allergies or adverse reactions: No Had a fall or experienced change  in activities of daily living that may affect risk of falls: No Signs or symptoms of abuse/neglect since last visito No Hospitalized since last visit: No Pain Present Now: No Electronic Signature(s) Signed: 02/13/2016 6:14:28 PM By: Alric Quan Entered By: Alric Quan on 02/12/2016 14:33:41 [name] ([id number]) -------------------------------------------------------------------------------- Clinic Level of Care Assessment Details Patient Name: [name] Date of Service: 02/12/2016 2:15 PM Medical Record Patient Account Number: [id number] Number: Treating RN: Ahmed Prima [birth year] (82 y.o. Other Clinician: Date of Birth/Sex: Female) Treating ROBSON, MICHAEL Primary Care Physician: Pernell Dupre Physician/Extender: G Referring Physician: Joylene John in Treatment: 0 Clinic Level of Care Assessment Items TOOL 2 Quantity Score X - Use when only an EandM is performed on the INITIAL visit 1 0 ASSESSMENTS - Nursing Assessment / Reassessment X - General Physical Exam (combine w/ comprehensive assessment (listed just 1 20 below) when performed on new pt. evals) X - Comprehensive Assessment (HX, ROS, Risk Assessments, Wounds Hx, etc.) 1 25 ASSESSMENTS - Wound and Skin Assessment / Reassessment []  - Simple Wound Assessment / Reassessment - one wound 0 X - Complex Wound Assessment / Reassessment - multiple wounds 2 5 []  - Dermatologic / Skin Assessment (not related to wound area) 0 ASSESSMENTS - Ostomy and/or Continence Assessment and Care []  - Incontinence Assessment and Management 0 []  - Ostomy Care Assessment and Management (repouching, etc.) 0 PROCESS - Coordination of Care []  - Simple Patient / Family Education for ongoing care 0 X - Complex (extensive) Patient / Family Education for ongoing care 1 20 []  - Staff obtains Programmer, systems, Records, Test Results / Process Orders 0 []  - Staff telephones HHA, Nursing Homes / Clarify orders  / etc 0 []  - Routine Transfer to another Facility (non-emergent condition) 0 []  - Routine Hospital Admission (non-emergent condition) 0 X - New Admissions / Biomedical engineer / Ordering NPWT, Apligraf, etc. 1 15 []  - Emergency Hospital Admission (emergent condition) 0 [place], [place] G. ([id number]) X - Simple Discharge Coordination 1 10 []  - Complex (extensive) Discharge Coordination 0 PROCESS - Special Needs []  - Pediatric /  Minor Patient Management 0 []  - Isolation Patient Management 0 []  - Hearing / Language / Visual special needs 0 []  - Assessment of Community assistance (transportation, D/C planning, etc.) 0 []  - Additional assistance / Altered mentation 0 []  - Support Surface(s) Assessment (bed, cushion, seat, etc.) 0 INTERVENTIONS - Wound Cleansing / Measurement X - Wound Imaging (photographs - any number of wounds) 1 5 []  - Wound Tracing (instead of photographs) 0 []  - Simple Wound Measurement - one wound 0 X - Complex Wound Measurement - multiple wounds 2 5 []  - Simple Wound Cleansing - one wound 0 X - Complex Wound Cleansing - multiple wounds 2 5 INTERVENTIONS - Wound Dressings []  - Small Wound Dressing one or multiple wounds 0 []  - Medium Wound Dressing one or multiple wounds 0 []  - Large Wound Dressing one or multiple wounds 0 []  - Application of Medications - injection 0 INTERVENTIONS - Miscellaneous []  - External ear exam 0 []  - Specimen Collection (cultures, biopsies, blood, body fluids, etc.) 0 []  - Specimen(s) / Culture(s) sent or taken to Lab for analysis 0 []  - Patient Transfer (multiple staff / Harrel Lemon Lift / Similar devices) 0 []  - Simple Staple / Suture removal (25 or less) 0 Flanagin, Hajra G. ([id number]) []  - Complex Staple / Suture removal (26 or more) 0 []  - Hypo / Hyperglycemic Management (close monitor of Blood Glucose) 0 X - Ankle / Brachial Index (ABI) - do not check if billed separately 1 15 Has the patient been seen at the hospital within the  last three years: Yes Total Score: 140 Level Of Care: New/Established - Level 4 Electronic Signature(s) Signed: 02/13/2016 6:14:28 PM By: Alric Quan Entered By: Alric Quan on 02/12/2016 17:09:04 [name] ([id number]) -------------------------------------------------------------------------------- Encounter Discharge Information Details Patient Name: [name] Date of Service: 02/12/2016 2:15 PM Medical Record Patient Account Number: [id number] Number: Treating RN: Ahmed Prima 1922/08/05 (82 y.o. Other Clinician: Date of Birth/Sex: Female) Treating ROBSON, MICHAEL Primary Care Physician: Pernell Dupre Physician/Extender: G Referring Physician: Joylene John in Treatment: 0 Encounter Discharge Information Items Discharge Pain Level: 0 Discharge Condition: Stable Ambulatory Status: Wheelchair Discharge Destination: Home Transportation: Private Auto Accompanied By: neice Schedule Follow-up Appointment: Yes Medication Reconciliation completed and provided to Patient/Care No Renelle Stegenga: Provided on Clinical Summary of Care: 02/12/2016 Form Type Recipient Paper Patient JW Electronic Signature(s) Signed: 02/12/2016 4:12:50 PM By: Ruthine Dose Entered By: Ruthine Dose on 02/12/2016 16:12:50 [name] ([id number]) -------------------------------------------------------------------------------- Lower Extremity Assessment Details Patient Name: [name] Date of Service: 02/12/2016 2:15 PM Medical Record Patient Account Number: [id number] Number: Treating RN: Ahmed Prima March 09, 1922 (82 y.o. Other Clinician: Date of Birth/Sex: Female) Treating ROBSON, [place] Primary Care Physician: Pernell Dupre Physician/Extender: G Referring Physician: Joylene John in Treatment: 0 Edema Assessment Assessed: [Left: No] [Right: No] Edema: [Left: Ye] [Right: s] Calf Left: Right: Point of  Measurement: 32 cm From Medial Instep 35.5 cm cm Ankle Left: Right: Point of Measurement: 10 cm From Medial Instep 20.4 cm cm Vascular Assessment Pulses: Posterior Tibial Palpable: [Left:No] Doppler: [Left:Monophasic] Dorsalis Pedis Palpable: [Left:No] Doppler: [Left:Monophasic] Extremity colors, hair growth, and conditions: Extremity Color: [Left:Mottled] Temperature of Extremity: [Left:Warm] Toe Nail Assessment Left: Right: Thick: No Discolored: No Deformed: No Improper Length and Hygiene: No Notes ABI left leg non-compressible TAMESHIA, VONASEK ([id number]) Electronic Signature(s) Signed: 02/13/2016 6:14:28 PM By: Alric Quan Entered By: Alric Quan on 02/12/2016 14:58:48 [name] ([id number]) -------------------------------------------------------------------------------- Multi Wound Chart Details Patient Name:  Martorelli, Hatsue G. Date of Service: 02/12/2016 2:15 PM Medical Record Patient Account Number: [id number] Number: Treating RN: Ahmed Prima 24-Jan-1922 (82 y.o. Other Clinician: Date of Birth/Sex: Female) Treating ROBSON, [place] Primary Care Physician: Pernell Dupre Physician/Extender: G Referring Physician: Joylene John in Treatment: 0 Vital Signs Height(in): 63 Pulse(bpm): 75 Weight(lbs): 142 Blood Pressure 142/68 (mmHg): Body Mass Index(BMI): 25 Temperature(F): 97.4 Respiratory Rate 18 (breaths/min): Photos: [5:No Photos] [6:No Photos] [N/A:N/A] Wound Location: [5:Left Lower Leg - Proximal Lower Leg - Distal] [N/A:N/A] Wounding Event: [5:Trauma] [6:Trauma] [N/A:N/A] Primary Etiology: [5:Trauma, Other] [6:Trauma, Other] [N/A:N/A] Comorbid History: [5:Cataracts, Angina, Arrhythmia, Congestive Arrhythmia, Congestive Heart Failure, Hypertension, Hepatitis B, Hypertension, Hepatitis B, Osteoarthritis, Seizure Disorder] [6:Cataracts, Angina, Heart Failure, Osteoarthritis, Seizure  Disorder] [N/A:N/A] Date  Acquired: [5:12/15/2015] [6:12/15/2015] [N/A:N/A] Weeks of Treatment: [5:0] [6:0] [N/A:N/A] Wound Status: [5:Open] [6:Open] [N/A:N/A] Measurements L x W x D 1.7x1.4x0.1 [6:0.4x1.1x0.1] [N/A:N/A] (cm) Area (cm) : [5:1.869] [6:0.346] [N/A:N/A] Volume (cm) : [5:0.187] [6:0.035] [N/A:N/A] % Reduction in Area: [5:0.00%] [6:N/A] [N/A:N/A] % Reduction in Volume: 0.00% [6:N/A] [N/A:N/A] Classification: [5:Partial Thickness] [6:Partial Thickness] [N/A:N/A] Exudate Amount: [5:Large] [6:Large] [N/A:N/A] Exudate Type: [5:Serosanguineous] [6:Serosanguineous] [N/A:N/A] Exudate Color: [5:red, brown] [6:red, brown] [N/A:N/A] Wound Margin: [5:Flat and Intact] [6:Flat and Intact] [N/A:N/A] Granulation Amount: [5:Small (1-33%)] [6:None Present (0%)] [N/A:N/A] Granulation Quality: [5:Red] [6:N/A] [N/A:N/A] Necrotic Amount: [5:Large (67-100%)] [6:Large (67-100%)] [N/A:N/A] Necrotic Tissue: [5:Eschar, Adherent [place], Adherent Slough N/A] Exposed Structures: Fascia: No Fascia: No N/A Fat: No Fat: No Tendon: No Tendon: No Muscle: No Muscle: No Joint: No Joint: No Bone: No Bone: No Limited to Skin Limited to Skin Breakdown Breakdown Epithelialization: None None N/A Periwound Skin Texture: Edema: Yes Edema: Yes N/A Periwound Skin Moist: Yes Moist: Yes N/A Moisture: Periwound Skin Color: Erythema: Yes Erythema: Yes N/A Erythema Location: Circumferential Circumferential N/A Temperature: No Abnormality No Abnormality N/A Tenderness on Yes Yes N/A Palpation: Wound Preparation: Ulcer Cleansing: Ulcer Cleansing: N/A Rinsed/Irrigated with Rinsed/Irrigated with Saline Saline Topical Anesthetic Topical Anesthetic Applied: Other: lidocaine Applied: Other: lidocaine 4% 4% Treatment Notes Electronic Signature(s) Signed: 02/13/2016 6:14:28 PM By: Alric Quan Entered By: Alric Quan on 02/12/2016 15:24:52 [name]  ([id number]) -------------------------------------------------------------------------------- [place] Details Patient Name: [name] Date of Service: 02/12/2016 2:15 PM Medical Record Patient Account Number: [id number] Number: Treating RN: Ahmed Prima 19-Jul-1922 (82 y.o. Other Clinician: Date of Birth/Sex: Female) Treating ROBSON, [place] Primary Care Physician: Pernell Dupre Physician/Extender: G Referring Physician: Joylene John in Treatment: 0 Active Inactive Abuse / Safety / Falls / Self Care Management Nursing Diagnoses: Potential for falls Goals: Patient will remain injury free Date Initiated: 02/12/2016 Goal Status: Active Interventions: Assess fall risk on admission and as needed Notes: Nutrition Nursing Diagnoses: Imbalanced nutrition Goals: Patient/caregiver agrees to and verbalizes understanding of need to use nutritional supplements and/or vitamins as prescribed Date Initiated: 02/12/2016 Goal Status: Active Interventions: Assess patient nutrition upon admission and as needed per policy Notes: Orientation to the Wound Care Program Nursing Diagnoses: Knowledge deficit related to the wound healing center program LAIBA, GOLZ ([id number]) Goals: Patient/caregiver will verbalize understanding of the [place] Date Initiated: 02/12/2016 Goal Status: Active Interventions: Provide education on orientation to the wound center Notes: Soft Tissue Infection Nursing Diagnoses: Impaired tissue integrity Goals: Patient/caregiver will verbalize understanding of or measures to prevent infection and contamination in the home setting Date Initiated: 02/12/2016 Goal Status: Active Interventions: Assess signs and symptoms of infection every visit Notes: Wound/Skin Impairment Nursing Diagnoses: Impaired tissue integrity Goals:  Ulcer/skin breakdown will have a volume reduction of 30% by  week 4 Date Initiated: 02/12/2016 Goal Status: Active Ulcer/skin breakdown will have a volume reduction of 50% by week 8 Date Initiated: 02/12/2016 Goal Status: Active Ulcer/skin breakdown will have a volume reduction of 80% by week 12 Date Initiated: 02/12/2016 Goal Status: Active Interventions: Assess patient/caregiver ability to obtain necessary supplies Assess ulceration(s) every visit MARLESE, BRENDER ([id number]) Notes: Electronic Signature(s) Signed: 02/13/2016 6:14:28 PM By: Alric Quan Entered By: Alric Quan on 02/12/2016 15:24:41 [name] ([id number]) -------------------------------------------------------------------------------- Pain Assessment Details Patient Name: [name] Date of Service: 02/12/2016 2:15 PM Medical Record Patient Account Number: [id number] Number: Treating RN: Ahmed Prima August 16, 1922 (82 y.o. Other Clinician: Date of Birth/Sex: Female) Treating ROBSON, MICHAEL Primary Care Physician: Pernell Dupre Physician/Extender: G Referring Physician: Joylene John in Treatment: 0 Active Problems Location of Pain Severity and Description of Pain Patient Has Paino No Site Locations Pain Management and Medication Current Pain Management: Electronic Signature(s) Signed: 02/13/2016 6:14:28 PM By: Alric Quan Entered By: Alric Quan on 02/12/2016 14:33:50 [name] ([id number]) -------------------------------------------------------------------------------- Patient/Caregiver Education Details Patient Name: [name] Date of Service: 02/12/2016 2:15 PM Medical Record Patient Account Number: [id number] Number: Treating RN: Ahmed Prima [birth year] (82 y.o. Other Clinician: Date of Birth/Gender: Female) Treating ROBSON, [place] Primary Care Physician: Pernell Dupre Physician/Extender: G Referring Physician: Joylene John in Treatment: 0 Education  Assessment Education Provided To: Patient and Caregiver Education Topics Provided Wound/Skin Impairment: Handouts: Other: change dressing as ordered Methods: Demonstration, Explain/Verbal Responses: State content correctly Electronic Signature(s) Signed: 02/13/2016 6:14:28 PM By: Alric Quan Entered By: Alric Quan on 02/12/2016 15:25:38 [name] ([id number]) -------------------------------------------------------------------------------- Wound Assessment Details Patient Name: [name] Date of Service: 02/12/2016 2:15 PM Medical Record Patient Account Number: [id number] Number: Treating RN: Ahmed Prima Aug 03, 1922 (82 y.o. Other Clinician: Date of Birth/Sex: Female) Treating ROBSON, [place] Primary Care Physician: Pernell Dupre Physician/Extender: G Referring Physician: Joylene John in Treatment: 0 Wound Status Wound Number: 5 Primary Trauma, Other Etiology: Wound Location: Left Lower Leg - Proximal Wound Open Wounding Event: Trauma Status: Date Acquired: 12/15/2015 Comorbid Cataracts, Angina, Arrhythmia, Weeks Of Treatment: 0 History: Congestive Heart Failure, Hypertension, Clustered Wound: No Hepatitis B, Osteoarthritis, Seizure Disorder Photos Photo Uploaded By: Alric Quan on 02/12/2016 16:59:31 Wound Measurements Length: (cm) 1.7 Width: (cm) 1.4 Depth: (cm) 0.1 Area: (cm) 1.869 Volume: (cm) 0.187 % Reduction in Area: 0% % Reduction in Volume: 0% Epithelialization: None Tunneling: No Undermining: No Wound Description Classification: Partial Thickness Wound Margin: Flat and Intact Exudate Amount: Large Exudate Type: Serosanguineous Exudate Color: red, brown Foul Odor After Cleansing: No Wound Bed Granulation Amount: Small (1-33%) Exposed Structure NI, ASIEDU ([id number]) Granulation Quality: Red Fascia Exposed: No Necrotic Amount: Large (67-100%) Fat Layer Exposed: No Necrotic  Quality: Eschar, Adherent Slough Tendon Exposed: No Muscle Exposed: No Joint Exposed: No Bone Exposed: No Limited to Skin Breakdown Periwound Skin Texture Texture Color No Abnormalities Noted: No No Abnormalities Noted: No Localized Edema: Yes Erythema: Yes Erythema Location: Circumferential Moisture No Abnormalities Noted: No Temperature / Pain Moist: Yes Temperature: No Abnormality Tenderness on Palpation: Yes Wound Preparation Ulcer Cleansing: Rinsed/Irrigated with Saline Topical Anesthetic Applied: Other: lidocaine 4%, Treatment Notes Wound #5 (Left, Proximal Lower Leg) 1. Cleansed with: Clean wound with Normal Saline 2. Anesthetic Topical Lidocaine 4% cream to wound bed prior to debridement 3. Peri-wound Care: Skin Prep 4. Dressing Applied: Aquacel Ag 5. Secondary Dressing Applied Bordered Foam  Dressing Electronic Signature(s) Signed: 02/13/2016 6:14:28 PM By: Alric Quan Entered By: Alric Quan on 02/12/2016 15:05:03 [name] ([id number]) -------------------------------------------------------------------------------- Wound Assessment Details Patient Name: [name] Date of Service: 02/12/2016 2:15 PM Medical Record Patient Account Number: [id number] Number: Treating RN: Ahmed Prima 10/14/1922 (82 y.o. Other Clinician: Date of Birth/Sex: Female) Treating ROBSON, [place] Primary Care Physician: Pernell Dupre Physician/Extender: G Referring Physician: Joylene John in Treatment: 0 Wound Status Wound Number: 6 Primary Trauma, Other Etiology: Wound Location: Lower Leg - Distal Wound Open Wounding Event: Trauma Status: Date Acquired: 12/15/2015 Comorbid Cataracts, Angina, Arrhythmia, Weeks Of Treatment: 0 History: Congestive Heart Failure, Hypertension, Clustered Wound: No Hepatitis B, Osteoarthritis, Seizure Disorder Photos Photo Uploaded By: Alric Quan on 02/12/2016 16:59:32 Wound  Measurements Length: (cm) 0.4 Width: (cm) 1.1 Depth: (cm) 0.1 Area: (cm) 0.346 Volume: (cm) 0.035 % Reduction in Area: % Reduction in Volume: Epithelialization: None Tunneling: No Undermining: No Wound Description Classification: Partial Thickness Wound Margin: Flat and Intact Exudate Amount: Large Exudate Type: Serosanguineous Exudate Color: red, brown Foul Odor After Cleansing: No Wound Bed Granulation Amount: None Present (0%) Exposed Structure KATRISHA, AMUSO ([id number]) Necrotic Amount: Large (67-100%) Fascia Exposed: No Necrotic Quality: Eschar, Adherent Slough Fat Layer Exposed: No Tendon Exposed: No Muscle Exposed: No Joint Exposed: No Bone Exposed: No Limited to Skin Breakdown Periwound Skin Texture Texture Color No Abnormalities Noted: No No Abnormalities Noted: No Localized Edema: Yes Erythema: Yes Erythema Location: Circumferential Moisture No Abnormalities Noted: No Temperature / Pain Moist: Yes Temperature: No Abnormality Tenderness on Palpation: Yes Wound Preparation Ulcer Cleansing: Rinsed/Irrigated with Saline Topical Anesthetic Applied: Other: lidocaine 4%, Treatment Notes Wound #6 (Distal Lower Leg) 1. Cleansed with: Clean wound with Normal Saline 2. Anesthetic Topical Lidocaine 4% cream to wound bed prior to debridement 3. Peri-wound Care: Skin Prep 4. Dressing Applied: Aquacel Ag 5. Secondary Dressing Applied Bordered Foam Dressing Electronic Signature(s) Signed: 02/13/2016 6:14:28 PM By: Alric Quan Entered By: Alric Quan on 02/12/2016 15:04:28 [name] ([id number]) -------------------------------------------------------------------------------- [place] Details Patient Name: [name] Date of Service: 02/12/2016 2:15 PM Medical Record Patient Account Number: [id number] Number: Treating RN: Ahmed Prima [birth year] (82 y.o. Other Clinician: Date of Birth/Sex: Female) Treating  ROBSON, [place] Primary Care Physician: Pernell Dupre Physician/Extender: G Referring Physician: Joylene John in Treatment: 0 Vital Signs Time Taken: 14:33 Temperature (F): 97.4 Height (in): 63 Pulse (bpm): 75 Source: Stated Respiratory Rate (breaths/min): 18 Weight (lbs): 142 Blood Pressure (mmHg): 142/68 Source: Stated Reference Range: 80 - 120 mg / dl Body Mass Index (BMI): 25.2 Electronic Signature(s) Signed: 02/13/2016 6:14:28 PM By: Alric Quan Entered By: Alric Quan on 02/12/2016 14:38:53

## 2016-02-14 NOTE — Progress Notes (Signed)
[name] ([id number]) Visit Report for 02/12/2016 Abuse/Suicide Risk Screen Details Patient Name: [name], [name] Date of Service: 02/12/2016 2:15 PM Medical Record Patient Account Number: [id number] Number: Treating RN: Ahmed Prima [birth year] (82 y.o. Other Clinician: Date of Birth/Sex: Female) Treating ROBSON, MICHAEL Primary Care Physician/Extender: Pearson Forster Physician: Referring Physician: Joylene John in Treatment: 0 Abuse/Suicide Risk Screen Items Answer ABUSE/SUICIDE RISK SCREEN: Has anyone close to you tried to hurt or harm you recentlyo No Do you feel uncomfortable with anyone in your familyo No Has anyone forced you do things that you didnot want to doo No Do you have any thoughts of harming yourselfo No Patient displays signs or symptoms of abuse and/or neglect. No Electronic Signature(s) Signed: 02/13/2016 6:14:28 PM By: Alric Quan Entered By: Alric Quan on 02/12/2016 14:43:44 [name] ([id number]) -------------------------------------------------------------------------------- Activities of Daily Living Details Patient Name: [name], [name] Date of Service: 02/12/2016 2:15 PM Medical Record Patient Account Number: [id number] Number: Treating RN: Ahmed Prima 1922/10/01 (82 y.o. Other Clinician: Date of Birth/Sex: Female) Treating ROBSON, MICHAEL Primary Care Physician/Extender: Pearson Forster Physician: Referring Physician: Joylene John in Treatment: 0 Activities of Daily Living Items Answer Activities of Daily Living (Please select one for each item) Drive Automobile Not Able Take Medications Need Assistance Use Telephone Completely Able Care for Appearance Completely Able Use Toilet Completely Able Bath / Shower Need Assistance Dress Self Need Assistance Feed Self Completely Able Walk Need Assistance Get In / Out Bed Need Assistance Housework Not  Able Prepare Meals Not Able Handle Money Need Assistance Shop for Self Need Assistance Electronic Signature(s) Signed: 02/13/2016 6:14:28 PM By: Alric Quan Entered By: Alric Quan on 02/12/2016 14:45:11 [name] ([id number]) -------------------------------------------------------------------------------- Education Assessment Details Patient Name: [name] Date of Service: 02/12/2016 2:15 PM Medical Record Patient Account Number: [id number] Number: Treating RN: Ahmed Prima 24-Dec-1921 (82 y.o. Other Clinician: Date of Birth/Sex: Female) Treating ROBSON, MICHAEL Primary Care Physician/Extender: Pearson Forster Physician: Referring Physician: Joylene John in Treatment: 0 Primary Learner Assessed: Patient Learning Preferences/Education Level/Primary Language Learning Preference: Explanation, Printed Material Highest Education Level: High School Preferred Language: English Cognitive Barrier Assessment/Beliefs Language Barrier: No Translator Needed: No Memory Deficit: No Emotional Barrier: No Cultural/Religious Beliefs Affecting Medical No Care: Physical Barrier Assessment Impaired Vision: Yes Glasses Impaired Hearing: No Decreased Hand dexterity: No Knowledge/Comprehension Assessment Knowledge Level: High Comprehension Level: High Ability to understand written High instructions: Ability to understand verbal High instructions: Motivation Assessment Anxiety Level: Calm Cooperation: Cooperative Education Importance: Acknowledges Need Interest in Health Problems: Asks Questions Perception: Coherent Willingness to Engage in Self- High Management Activities: High [name], [name] ([id number]) Readiness to Engage in Self- Management Activities: Electronic Signature(s) Signed: 02/13/2016 6:14:28 PM By: Alric Quan Entered By: Alric Quan on 02/12/2016 14:45:42 [name]  ([id number]) -------------------------------------------------------------------------------- Fall Risk Assessment Details Patient Name: [name] Date of Service: 02/12/2016 2:15 PM Medical Record Patient Account Number: [id number] Number: Treating RN: Ahmed Prima Nov 02, 1922 (82 y.o. Other Clinician: Date of Birth/Sex: Female) Treating ROBSON, MICHAEL Primary Care Physician/Extender: Pearson Forster Physician: Referring Physician: Joylene John in Treatment: 0 Fall Risk Assessment Items Have you had 2 or more falls in the last 12 monthso 0 No Have you had any fall that resulted in injury in the last 12 monthso 0 No FALL RISK ASSESSMENT: History of falling - immediate or within 3 months 0 No Secondary diagnosis 15 Yes Ambulatory aid None/bed rest/wheelchair/nurse 0  Yes Crutches/cane/walker 15 Yes Furniture 0 No IV Access/Saline Lock 0 No Gait/Training Normal/bed rest/immobile 0 No Weak 10 Yes Impaired 20 Yes Mental Status Oriented to own ability 0 Yes Electronic Signature(s) Signed: 02/13/2016 6:14:28 PM By: Alric Quan Entered By: Alric Quan on 02/12/2016 14:46:15 [name] ([id number]) -------------------------------------------------------------------------------- Foot Assessment Details Patient Name: [name] Date of Service: 02/12/2016 2:15 PM Medical Record Patient Account Number: [id number] Number: Treating RN: Ahmed Prima 03-05-1922 (82 y.o. Other Clinician: Date of Birth/Sex: Female) Treating ROBSON, MICHAEL Primary Care Physician/Extender: Pearson Forster Physician: Referring Physician: Joylene John in Treatment: 0 Foot Assessment Items Site Locations + = Sensation present, - = Sensation absent, C = Callus, U = Ulcer R = Redness, W = Warmth, M = Maceration, PU = Pre-ulcerative lesion F = Fissure, S = Swelling, D = Dryness Assessment Right: Left: Other  Deformity: No No Prior Foot Ulcer: No No Prior Amputation: No No Charcot Joint: No No Ambulatory Status: Ambulatory With Help Assistance Device: Walker Gait: Steady Electronic Signature(s) Signed: 02/13/2016 6:14:28 PM By: Arminda Resides ([id number]) Entered By: Alric Quan on 02/12/2016 14:49:40 [name] ([id number]) -------------------------------------------------------------------------------- Nutrition Risk Assessment Details Patient Name: [name] Date of Service: 02/12/2016 2:15 PM Medical Record Patient Account Number: [id number] Number: Treating RN: Ahmed Prima 1922/06/27 (82 y.o. Other Clinician: Date of Birth/Sex: Female) Treating ROBSON, MICHAEL Primary Care Physician/Extender: Pearson Forster Physician: Referring Physician: Joylene John in Treatment: 0 Height (in): 63 Weight (lbs): 142 Body Mass Index (BMI): 25.2 Nutrition Risk Assessment Items NUTRITION RISK SCREEN: I have an illness or condition that made me change the kind and/or 2 Yes amount of food I eat I eat fewer than two meals per day 3 Yes I eat few fruits and vegetables, or milk products 0 No I have three or more drinks of beer, liquor or wine almost every day 0 No I have tooth or mouth problems that make it hard for me to eat 0 No I don't always have enough money to buy the food I need 0 No I eat alone most of the time 0 No I take three or more different prescribed or over-the-counter drugs a 1 Yes day Without wanting to, I have lost or gained 10 pounds in the last six 0 No months I am not always physically able to shop, cook and/or feed myself 2 Yes Nutrition Protocols Good Risk Protocol Moderate Risk Protocol Electronic Signature(s) Signed: 02/13/2016 6:14:28 PM By: Alric Quan Entered By: Alric Quan on 02/12/2016 14:47:06

## 2016-02-14 NOTE — Progress Notes (Signed)
[name], [name] ([id number]) Visit Report for 02/12/2016 Chief Complaint Document Details Patient Name: [name], [name]. Date of Service: 02/12/2016 2:15 PM Medical Record Patient Account Number: [id number] Number: Treating RN: Ahmed Prima [birth year] (82 y.o. Other Clinician: Date of Birth/Sex: Female) Treating ROBSON, MICHAEL Primary Care Physician/Extender: Pearson Forster Physician: Referring Physician: Joylene John in Treatment: 0 Information Obtained from: Patient Chief Complaint Patient returns to the wound care center for new ulcer to: left lower extremity lateral part of the calf. This has been there for about a month. About 4 days ago she injured her right calf area when she was applying some ointment and she may have scratched herself with a nail. 02/12/16; patient returns today for a two-month history of a nonhealing ulcer on her upper left lateral leg just below the fibular head Electronic Signature(s) Signed: 02/12/2016 6:33:23 PM By: Linton Ham MD Entered By: Linton Ham on 02/12/2016 18:10:59 [name] ([id number]) -------------------------------------------------------------------------------- HPI Details Patient Name: [name] Date of Service: 02/12/2016 2:15 PM Medical Record Patient Account Number: [id number] Number: Treating RN: Ahmed Prima 02-17-1922 (82 y.o. Other Clinician: Date of Birth/Sex: Female) Treating ROBSON, MICHAEL Primary Care Physician/Extender: Pearson Forster Physician: Referring Physician: Joylene John in Treatment: 0 History of Present Illness HPI Description: The patient is a pleasant 82 year old with a past medical history significant for aortic valve replacement (on Coumadin), pacemaker, CHF, abdominal aortic aneurysm, and CVA. No history of diabetes or peripheral vascular disease. She underwent excision of a squamous cell cancer on her right upper  arm and left dorsal foot in early August 2015 at the [facility]. Shave biopsy on May 11, 2014 showed "superficially invasive squamous cell carcinoma". She subsequently underwent wide excision with clear margins. She presented to the wound clinic with full thickness open surgical wounds. No symptoms to suggest ischemic rest pain or claudication. ABI 1.1. Cultures of L foot on August 09, 2014 grew ORSA sensitive to tigecycline. She has completed a course of doxycycline. 02/06/2015. -- This time around she had multiple furuncles over her left lower extremity and some on her right too. Was treated by her primary care physician with doxycycline and also was treated for shingles. Most of these ulcerations had healed primarily but the one on her left lower extremity lateral part is open and has continued to drain for about a month. She has minimal pain over this area. She has been using some local ointment and a dressing has been put over this. Last year she had a full vascular workup at [facility] in [place] and saw Dr. Donnetta Hutching for an opinion regarding arterial insufficiency. though she had significant arterial insufficiency a major surgical procedure was not recommended because of her age. Conservative therapy was recommended. The patient has had some vascular vein surgery done about 6 years ago in this hospital. 03/26/2015 -- she now has several other issues going with a cardiac problem and has been quite worried about this. Her leg is doing fine otherwise. 04/02/2015 -- all her chest and abdominal pain turned out to be just chronic constipation and a cardiac workup was fine. She is much relieved about the news. 02/12/16; this is a patient that we have had previously in this clinic. She has severe bilateral venous hypertension with hemosiderin deposition. She also has a history of squamous cell CA with a skin having a cancer excised from her left dorsal foot previously.  She is followed by Dr. Redmond Pulling at the  skin surgery Center in [place]. She also appears to of had a history of lower extremity wounds probably related to venous insufficiency. I note that she has a history of arterial insufficiency by looking at the notes above. Her ABIs were noncompressible in this clinic. Her last arterial Doppler in [facility] from 2014 suggested monophasic waves bilaterally. She had a right ABI of 0.68 and a left of 0.78. She had diffuse plaque throughout the bilateral femoral-popliteal large artery system no flow was adequately detected within the Lockwood, VAYLA DOMEIER. ([id number]) right proximal popliteal suggestive of total occlusion. I don't think she was ever attempted to be revascularized in any case the history is that she has had nonhealing wounds on the left leg for 2 months. These apparently almost healed over using honey that they had at home. Ever she bumped the leg against the bed and they reopened. She also has excoriated lesions over her predominant right lateral thigh she scratches these continuously. She sews me small raised areas on her legs which she states resulted in the wounds that she has on the left leg Electronic Signature(s) Signed: 02/12/2016 6:33:23 PM By: Linton Ham MD Entered By: Linton Ham on 02/12/2016 18:20:43 [name] ([id number]) -------------------------------------------------------------------------------- Physical Exam Details Patient Name: [name] Date of Service: 02/12/2016 2:15 PM Medical Record Patient Account Number: [id number] Number: Treating RN: Ahmed Prima 02-22-1922 (82 y.o. Other Clinician: Date of Birth/Sex: Female) Treating ROBSON, MICHAEL Primary Care Physician/Extender: Pearson Forster Physician: Referring Physician: Joylene John in Treatment: 0 Constitutional Sitting or standing Blood Pressure is within target range for patient.. Supine Blood Pressure  is within target range for patient.[name] Heart rate irregular.[name] Respirations regular, non-labored and within target range.. Patient's appearance is neat and clean. Appears in no acute distress. Well nourished and well developed.. Eyes Conjunctivae clear. No discharge.. Ears, Nose, Mouth, and Throat Oropharynx within normal limits, without erythema, exudate or ulceration.[name] Respiratory Respiratory effort is easy and symmetric bilaterally. Rate is normal at rest and on room air.. Bilateral breath sounds are clear and equal in all lobes with no wheezes, rales or rhonchi.. Cardiovascular 3/6 pansystolic murmur at the lower left sternal border no S3. Femoral arteries without bruits and pulses strong.. Pedal pulses absent bilaterally.. Edema present in both extremities. Severe bilateral hemosiderin hemosiderin deposition. Gastrointestinal (GI) Abdomen is soft and non-distended without masses or tenderness. Bowel sounds active in all quadrants.. No liver or spleen enlargement or tenderness.. Lymphatic Nonpalpable in the cervical clavicular or axillary areas. Integumentary (Hair, Skin) Severe bilateral venous insufficiency healed wounds noted on the left foot right leg. Psychiatric No evidence of depression, anxiety, or agitation. Calm, cooperative, and communicative. Appropriate interactions and affect.. Notes The concerning wound areas are on the left anterior lateral leg just below the fibular head. I did not debridement these today 2 small areas. The patient is seeing her dermatologist tomorrow and I'll see if he wants to biopsy these. In view of the fact that she is having that appointment the leg was not wrapped Electronic Signature(s) Signed: 02/12/2016 6:33:23 PM By: Linton Ham MD Entered By: Linton Ham on 02/12/2016 18:23:51 [name], [name] ([id number]) [name], [name] ([id number]) -------------------------------------------------------------------------------- Physician  Orders Details Patient Name: [name] Date of Service: 02/12/2016 2:15 PM Medical Record Patient Account Number: [id number] Number: Treating RN: Ahmed Prima September 05, 1922 (82 y.o. Other Clinician: Date of Birth/Sex: Female) Treating ROBSON, MICHAEL Primary Care Physician/Extender: Pearson Forster Physician: Referring Physician: Joylene John in Treatment: 0  Verbal / Phone Orders: Yes Clinician: Pinkerton, Debi Read Back and Verified: Yes Diagnosis Coding Wound Cleansing Wound #5 Left,Proximal Lower Leg o Clean wound with Normal Saline. Wound #6 Distal Lower Leg o Clean wound with Normal Saline. Anesthetic Wound #5 Left,Proximal Lower Leg o Topical Lidocaine 4% cream applied to wound bed prior to debridement Wound #6 Distal Lower Leg o Topical Lidocaine 4% cream applied to wound bed prior to debridement Skin Barriers/Peri-Wound Care Wound #5 Left,Proximal Lower Leg o Skin Prep Wound #6 Distal Lower Leg o Skin Prep Primary Wound Dressing Wound #5 Left,Proximal Lower Leg o Aquacel Ag Wound #6 Distal Lower Leg o Aquacel Ag Secondary Dressing Wound #5 Left,Proximal Lower Leg o Boardered Foam Dressing Wound #6 Distal Lower Leg SANTERIA, BUFFO ([id number]) o Boardered Foam Dressing Dressing Change Frequency Wound #5 Left,Proximal Lower Leg o Other: - keep on until you see Dr. Redmond Pulling (dermatology) Wound #6 Distal Lower Leg o Other: - keep on until you see Dr. Redmond Pulling (dermatology) Follow-up Appointments Wound #5 Left,Proximal Lower Leg o Return Appointment in 1 week. Wound #6 Distal Lower Leg o Return Appointment in 1 week. Edema Control Wound #5 Left,Proximal Lower Leg o Elevate legs to the level of the heart and pump ankles as often as possible Wound #6 Distal Lower Leg o Elevate legs to the level of the heart and pump ankles as often as possible Additional Orders / Instructions Wound #5  Left,Proximal Lower Leg o Increase protein intake. Wound #6 Distal Lower Leg o Increase protein intake. Medications-please add to medication list. Wound #5 Left,Proximal Lower Leg o Other: - Vitamin C, Zinc Multivitamin Wound #6 Distal Lower Leg o Other: - Vitamin C, Zinc Multivitamin Electronic Signature(s) Signed: 02/12/2016 6:33:23 PM By: Linton Ham MD Signed: 02/13/2016 6:14:28 PM By: Alric Quan Entered By: Alric Quan on 02/12/2016 16:04:12 [name] ([id number]) -------------------------------------------------------------------------------- Problem List Details Patient Name: [name] Date of Service: 02/12/2016 2:15 PM Medical Record Patient Account Number: [id number] Number: Treating RN: Ahmed Prima 1922-07-14 (82 y.o. Other Clinician: Date of Birth/Sex: Female) Treating ROBSON, MICHAEL Primary Care Physician/Extender: Pearson Forster Physician: Referring Physician: Joylene John in Treatment: 0 Active Problems ICD-10 Encounter Code Description Active Date Diagnosis I87.333 Chronic venous hypertension (idiopathic) with ulcer and 02/12/2016 Yes inflammation of bilateral lower extremity C44.82 Squamous cell carcinoma of overlapping sites of skin 02/12/2016 Yes I48.2 Chronic atrial fibrillation 02/12/2016 Yes Inactive Problems Resolved Problems Electronic Signature(s) Signed: 02/12/2016 6:33:23 PM By: Linton Ham MD Entered By: Linton Ham on 02/12/2016 18:27:43 [name] ([id number]) -------------------------------------------------------------------------------- Progress Note Details Patient Name: [name] Date of Service: 02/12/2016 2:15 PM Medical Record Patient Account Number: [id number] Number: Treating RN: Ahmed Prima 1922-02-25 (82 y.o. Other Clinician: Date of Birth/Sex: Female) Treating ROBSON, MICHAEL Primary Care Physician/Extender: Pearson Forster Physician: Referring Physician: Joylene John in Treatment: 0 Subjective Chief Complaint Information obtained from Patient Patient returns to the wound care center for new ulcer to: left lower extremity lateral part of the calf. This has been there for about a month. About 4 days ago she injured her right calf area when she was applying some ointment and she may have scratched herself with a nail. 02/12/16; patient returns today for a two- month history of a nonhealing ulcer on her upper left lateral leg just below the fibular head History of Present Illness (HPI) The patient is a pleasant 81 year old with a past medical history significant for aortic valve replacement (on Coumadin),  pacemaker, CHF, abdominal aortic aneurysm, and CVA. No history of diabetes or peripheral vascular disease. She underwent excision of a squamous cell cancer on her right upper arm and left dorsal foot in early August 2015 at the [facility]. Shave biopsy on May 11, 2014 showed "superficially invasive squamous cell carcinoma". She subsequently underwent wide excision with clear margins. She presented to the wound clinic with full thickness open surgical wounds. No symptoms to suggest ischemic rest pain or claudication. ABI 1.1. Cultures of L foot on August 09, 2014 grew ORSA sensitive to tigecycline. She has completed a course of doxycycline. 02/06/2015. -- This time around she had multiple furuncles over her left lower extremity and some on her right too. Was treated by her primary care physician with doxycycline and also was treated for shingles. Most of these ulcerations had healed primarily but the one on her left lower extremity lateral part is open and has continued to drain for about a month. She has minimal pain over this area. She has been using some local ointment and a dressing has been put over this. Last year she had a full vascular workup at [facility] in  [place] [employer] and saw Dr. Donnetta Hutching for an opinion regarding arterial insufficiency. though she had significant arterial insufficiency a major surgical procedure was not recommended because of her age. Conservative therapy was recommended. The patient has had some vascular vein surgery done about 6 years ago in this hospital. 03/26/2015 -- she now has several other issues going with a cardiac problem and has been quite worried about this. Her leg is doing fine otherwise. 04/02/2015 -- all her chest and abdominal pain turned out to be just chronic constipation and a cardiac workup was fine. She is much relieved about the news. [name], [name] ([id number]) 02/12/16; this is a patient that we have had previously in this clinic. She has severe bilateral venous hypertension with hemosiderin deposition. She also has a history of squamous cell CA with a skin having a cancer excised from her left dorsal foot previously. She is followed by Dr. Redmond Pulling at the skin surgery Center in [place]. She also appears to of had a history of lower extremity wounds probably related to venous insufficiency. I note that she has a history of arterial insufficiency by looking at the notes above. Her ABIs were noncompressible in this clinic. Her last arterial Doppler in [facility] from 2014 suggested monophasic waves bilaterally. She had a right ABI of 0.68 and a left of 0.78. She had diffuse plaque throughout the bilateral femoral-popliteal large artery system no flow was adequately detected within the right proximal popliteal suggestive of total occlusion. I don't think she was ever attempted to be revascularized in any case the history is that she has had nonhealing wounds on the left leg for 2 months. These apparently almost healed over using honey that they had at home. Ever she bumped the leg against the bed and they reopened. She also has excoriated lesions over her predominant right lateral thigh she scratches  these continuously. She sews me small raised areas on her legs which she states resulted in the wounds that she has on the left leg Wound History Patient presents with 2 open wounds that have been present for approximately 2 months. Patient has been treating wounds in the following manner: medihoney, nonstick pad. Laboratory tests have not been performed in the last month. Patient reportedly has tested positive for an antibiotic resistant organism. Patient reportedly has not  tested positive for osteomyelitis. Patient reportedly has had testing performed to evaluate circulation in the legs. Patient experiences the following problems associated with their wounds: swelling. Patient History Information obtained from Patient. Allergies Penicillins (Reaction: rash), sulfa drugs/derivitives (Reaction: n/v), iodine dye (Reaction: chills; fever), latex, advair hfa (Reaction: anaphylaxis), Verapamil (Reaction: nausea; loss of appetite), furosemide (Reaction: SOB) Family History Cancer - Mother, Heart Disease - Mother, Father, Hypertension - Mother, Father, Stroke - Siblings, Thyroid Problems - Siblings, No family history of Diabetes, Hereditary Spherocytosis, Kidney Disease, Lung Disease, Seizures. Social History Never smoker, Marital Status - Widowed, Alcohol Use - Never, Drug Use - No History, Caffeine Use - Rarely. Medical History Eyes Patient has history of Cataracts Ear/Nose/Mouth/Throat Denies history of Middle ear problems Cardiovascular [name], [name] ([id number]) Patient has history of Angina, Arrhythmia, Congestive Heart Failure, Hypertension Gastrointestinal Patient has history of Hepatitis B - B Musculoskeletal Patient has history of Osteoarthritis Neurologic Patient has history of Seizure Disorder - under anesthesia Hospitalization/Surgery History - 11/17/2004, [facility], aneurysm removal and aortic valve replacement. - 08/18/2007, [facility], Pacemaker. Medical And Surgical  History Notes Constitutional Symptoms (General Health) Stroke; descending aortic aneurysm Cardiovascular Pacemaker 2008; aortic valve replacement 2006 Neurologic seizures after anesthesia Oncologic Skin CA Review of Systems (ROS) Eyes Complains or has symptoms of Glasses / Contacts - glasses. Objective Constitutional Sitting or standing Blood Pressure is within target range for patient.. Supine Blood Pressure is within target range for patient.[name] Heart rate irregular.[name] Respirations regular, non-labored and within target range.. Patient's appearance is neat and clean. Appears in no acute distress. Well nourished and well developed.. Vitals Time Taken: 2:33 PM, Height: 63 in, Source: Stated, Weight: 142 lbs, Source: Stated, BMI: 25.2, Temperature: 97.4 F, Pulse: 75 bpm, Respiratory Rate: 18 breaths/min, Blood Pressure: 142/68 mmHg. Eyes Conjunctivae clear. No discharge.. Ears, Nose, Mouth, and Throat Oropharynx within normal limits, without erythema, exudate or ulceration.[name] Respiratory Respiratory effort is easy and symmetric bilaterally. Rate is normal at rest and on room air.. Bilateral breath sounds are clear and equal in all lobes with no wheezes, rales or rhonchi.[name], [name] ([id number]) Cardiovascular 3/6 pansystolic murmur at the lower left sternal border no S3. Femoral arteries without bruits and pulses strong.. Pedal pulses absent bilaterally.. Edema present in both extremities. Severe bilateral hemosiderin hemosiderin deposition. Gastrointestinal (GI) Abdomen is soft and non-distended without masses or tenderness. Bowel sounds active in all quadrants.. No liver or spleen enlargement or tenderness.. Lymphatic Nonpalpable in the cervical clavicular or axillary areas. Psychiatric No evidence of depression, anxiety, or agitation. Calm, cooperative, and communicative. Appropriate interactions and affect.. General Notes: The concerning wound areas are on the left  anterior lateral leg just below the fibular head. I did not debridement these today 2 small areas. The patient is seeing her dermatologist tomorrow and I'll see if he wants to biopsy these. In view of the fact that she is having that appointment the leg was not wrapped Integumentary (Hair, Skin) Severe bilateral venous insufficiency healed wounds noted on the left foot right leg. Wound #5 status is Open. Original cause of wound was Trauma. The wound is located on the Left,Proximal Lower Leg. The wound measures 1.7cm length x 1.4cm width x 0.1cm depth; 1.869cm^2 area and 0.187cm^3 volume. The wound is limited to skin breakdown. There is no tunneling or undermining noted. There is a large amount of serosanguineous drainage noted. The wound margin is flat and intact. There is small (1-33%) red granulation within the wound bed. There is a large (  67-100%) amount of necrotic tissue within the wound bed including Eschar and Adherent Slough. The periwound skin appearance exhibited: Localized Edema, Moist, Erythema. The surrounding wound skin color is noted with erythema which is circumferential. Periwound temperature was noted as No Abnormality. The periwound has tenderness on palpation. Wound #6 status is Open. Original cause of wound was Trauma. The wound is located on the Distal Lower Leg. The wound measures 0.4cm length x 1.1cm width x 0.1cm depth; 0.346cm^2 area and 0.035cm^3 volume. The wound is limited to skin breakdown. There is no tunneling or undermining noted. There is a large amount of serosanguineous drainage noted. The wound margin is flat and intact. There is no granulation within the wound bed. There is a large (67-100%) amount of necrotic tissue within the wound bed including Eschar and Adherent Slough. The periwound skin appearance exhibited: Localized Edema, Moist, Erythema. The surrounding wound skin color is noted with erythema which is circumferential. Periwound temperature was  noted as No Abnormality. The periwound has tenderness on palpation. Assessment [name], [name] ([id number]) Active Problems ICD-10 [phone] - Chronic venous hypertension (idiopathic) with ulcer and inflammation of bilateral lower extremity C44.82 - Squamous cell carcinoma of overlapping sites of skin I48.2 - Chronic atrial fibrillation Plan Wound Cleansing: Wound #5 Left,Proximal Lower Leg: Clean wound with Normal Saline. Wound #6 Distal Lower Leg: Clean wound with Normal Saline. Anesthetic: Wound #5 Left,Proximal Lower Leg: Topical Lidocaine 4% cream applied to wound bed prior to debridement Wound #6 Distal Lower Leg: Topical Lidocaine 4% cream applied to wound bed prior to debridement Skin Barriers/Peri-Wound Care: Wound #5 Left,Proximal Lower Leg: Skin Prep Wound #6 Distal Lower Leg: Skin Prep Primary Wound Dressing: Wound #5 Left,Proximal Lower Leg: Aquacel Ag Wound #6 Distal Lower Leg: Aquacel Ag Secondary Dressing: Wound #5 Left,Proximal Lower Leg: Boardered Foam Dressing Wound #6 Distal Lower Leg: Boardered Foam Dressing Dressing Change Frequency: Wound #5 Left,Proximal Lower Leg: Other: - keep on until you see Dr. Redmond Pulling (dermatology) Wound #6 Distal Lower Leg: Other: - keep on until you see Dr. Redmond Pulling (dermatology) Follow-up Appointments: Wound #5 Left,Proximal Lower Leg: Return Appointment in 1 week. Wound #6 Distal Lower Leg: Return Appointment in 1 week. Edema Control: [name], [name] ([id number]) Wound #5 Left,Proximal Lower Leg: Elevate legs to the level of the heart and pump ankles as often as possible Wound #6 Distal Lower Leg: Elevate legs to the level of the heart and pump ankles as often as possible Additional Orders / Instructions: Wound #5 Left,Proximal Lower Leg: Increase protein intake. Wound #6 Distal Lower Leg: Increase protein intake. Medications-please add to medication list.: Wound #5 Left,Proximal Lower Leg: Other: - Vitamin C,  Zinc Multivitamin Wound #6 Distal Lower Leg: Other: - Vitamin C, Zinc Multivitamin 1 we applied silver alginate with a foam dressing pending the patient's trip to her dermatologist tomorrow. Otherwise I probably would've wrap this leg. #2 I don't think these wounds or malignant and I'm not sure I would biopsy these unless the areas were nonhealing. Unfortunately we can have Dr. Dois Davenport input into this tomorrow and by next week when she returns for follow-up. #3 she has valvular heart disease, pulmonary hypertension and atrial fibrillation on Coumadin, and is status post pacemaker She has had a previous valve replacement unfortunately none of this seems to be unstable at the bedside today Electronic Signature(s) Signed: 02/12/2016 6:33:23 PM By: Linton Ham MD Entered By: Linton Ham on 02/12/2016 18:26:08 [name] ([id number]) -------------------------------------------------------------------------------- ROS/PFSH Details Patient Name: [name] Date  of Service: 02/12/2016 2:15 PM Medical Record Patient Account Number: [id number] Number: Treating RN: Ahmed Prima [birth year] (82 y.o. Other Clinician: Date of Birth/Sex: Female) Treating ROBSON, MICHAEL Primary Care Physician/Extender: Pearson Forster Physician: Referring Physician: Joylene John in Treatment: 0 Label Progress Note Print Version as History and Physical for this encounter Information Obtained From Patient Wound History Do you currently have one or more open woundso Yes How many open wounds do you currently haveo 2 Approximately how long have you had your woundso 2 months How have you been treating your wound(s) until nowo medihoney, nonstick pad Has your wound(s) ever healed and then re-openedo No Have you had any lab work done in the past montho No 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 Con Memos Where was the test doneo [facility] Have you had other problems associated with your woundso Swelling Eyes Complaints and Symptoms: Positive for: Glasses / Contacts - glasses Medical History: Positive for: Cataracts Constitutional Symptoms (General Health) Medical History: Past Medical History Notes: Stroke; descending aortic aneurysm Ear/Nose/Mouth/Throat Medical History: Negative for: Middle ear problems [name], [name] ([id number]) Cardiovascular Medical History: Positive for: Angina; Arrhythmia; Congestive Heart Failure; Hypertension Past Medical History Notes: Pacemaker 2008; aortic valve replacement 2006 Gastrointestinal Medical History: Positive for: Hepatitis B - B Musculoskeletal Medical History: Positive for: Osteoarthritis Neurologic Medical History: Positive for: Seizure Disorder - under anesthesia Past Medical History Notes: seizures after anesthesia Oncologic Medical History: Past Medical History Notes: Skin CA HBO Extended History Items Eyes: Cataracts Hospitalization / Surgery History Name of Hospital Purpose of Hospitalization/Surgery Date [facility] aneurysm removal and aortic valve replacement 11/17/2004 [facility] 08/18/2007 Family and Social History Cancer: Yes - Mother; Diabetes: No; Heart Disease: Yes - Mother, Father; Hereditary Spherocytosis: No; Hypertension: Yes - Mother, Father; Kidney Disease: No; Lung Disease: No; Seizures: No; Stroke: Yes - Siblings; Thyroid Problems: Yes - Siblings; Never smoker; Marital Status - Widowed; Alcohol Use: Never; Drug Use: No History; Caffeine Use: Rarely; Financial Concerns: No; Food, Clothing or Shelter Needs: No; Support System Lacking: No; Transportation Concerns: No; Advanced Directives: Yes (Not Provided); Patient does not want information on Advanced Directives;  Do not resuscitate: Yes (Not Provided); Living Will: Yes (Not Provided); Medical Power of Attorney: Yes - Faylene Million- neice (Not Provided) Electronic Signature(s) Signed: 02/12/2016 6:33:23 PM By: Linton Ham MD [name] ([id number]) Signed: 02/13/2016 6:14:28 PM By: Alric Quan Entered By: Alric Quan on 02/12/2016 14:43:33 [name] ([id number]) -------------------------------------------------------------------------------- [place] Details Patient Name: [name], [name] Date of Service: 02/12/2016 Medical Record Patient Account Number: [id number] Number: Treating RN: Ahmed Prima 03/08/1922 (82 y.o. Other Clinician: Date of Birth/Sex: Female) Treating ROBSON, MICHAEL Primary Care Physician/Extender: Pearson Forster Physician: Weeks in Treatment: 0 Referring Physician: Pernell Dupre Diagnosis Coding ICD-10 Codes Code Description Chronic venous hypertension (idiopathic) with ulcer and inflammation of left lower I87.332 extremity Facility Procedures CPT4 Code: [id number] Description: 99214 - WOUND CARE VISIT-LEV 4 EST PT Modifier: Quantity: 1 Physician Procedures CPT4: Description Modifier Quantity Code [id number] - WC PHYS LEVEL 4 - EST PT 1 ICD-10 Description Diagnosis I87.332 Chronic venous hypertension (idiopathic) with ulcer and inflammation of left lower extremity Electronic Signature(s) Signed: 02/12/2016 6:33:23 PM By: Linton Ham MD Entered By: Linton Ham on 02/12/2016 18:31:13

## 2016-02-19 ENCOUNTER — Encounter: Payer: Medicare Other | Attending: Internal Medicine | Admitting: Internal Medicine

## 2016-02-19 DIAGNOSIS — Z880 Allergy status to penicillin: Secondary | ICD-10-CM | POA: Diagnosis not present

## 2016-02-19 DIAGNOSIS — Z950 Presence of cardiac pacemaker: Secondary | ICD-10-CM | POA: Insufficient documentation

## 2016-02-19 DIAGNOSIS — I482 Chronic atrial fibrillation: Secondary | ICD-10-CM | POA: Insufficient documentation

## 2016-02-19 DIAGNOSIS — I509 Heart failure, unspecified: Secondary | ICD-10-CM | POA: Insufficient documentation

## 2016-02-19 DIAGNOSIS — Z8673 Personal history of transient ischemic attack (TIA), and cerebral infarction without residual deficits: Secondary | ICD-10-CM | POA: Insufficient documentation

## 2016-02-19 DIAGNOSIS — L97811 Non-pressure chronic ulcer of other part of right lower leg limited to breakdown of skin: Secondary | ICD-10-CM | POA: Diagnosis not present

## 2016-02-19 DIAGNOSIS — C4482 Squamous cell carcinoma of overlapping sites of skin: Secondary | ICD-10-CM | POA: Diagnosis not present

## 2016-02-19 DIAGNOSIS — Z952 Presence of prosthetic heart valve: Secondary | ICD-10-CM | POA: Insufficient documentation

## 2016-02-19 DIAGNOSIS — I714 Abdominal aortic aneurysm, without rupture: Secondary | ICD-10-CM | POA: Diagnosis not present

## 2016-02-19 DIAGNOSIS — L97821 Non-pressure chronic ulcer of other part of left lower leg limited to breakdown of skin: Secondary | ICD-10-CM | POA: Insufficient documentation

## 2016-02-19 DIAGNOSIS — M1990 Unspecified osteoarthritis, unspecified site: Secondary | ICD-10-CM | POA: Diagnosis not present

## 2016-02-19 DIAGNOSIS — I87333 Chronic venous hypertension (idiopathic) with ulcer and inflammation of bilateral lower extremity: Secondary | ICD-10-CM | POA: Diagnosis not present

## 2016-02-20 NOTE — Progress Notes (Signed)
[name], [name] ([id number]) Visit Report for 02/19/2016 Chief Complaint Document Details Patient Name: [name], [name]. Date of Service: 02/19/2016 1:30 PM Medical Record Patient Account Number: [id number] Number: Treating RN: [name] 06-27-1922 (82 y.o. Other Clinician: Date of Birth/Sex: Female) Treating [name] Primary Care Physician/Extender: [name] Physician: Referring Physician: Joylene [name] in Treatment: 1 Information Obtained from: Patient Chief Complaint Patient returns to the wound care center for new ulcer to: left lower extremity lateral part of the calf. This has been there for about a month. About 4 days ago she injured her right calf area when she was applying some ointment and she may have scratched herself with a nail. 02/12/16; patient returns today for a two-month history of a nonhealing ulcer on her upper left lateral leg just below the fibular head Electronic Signature(s) Signed: 02/20/2016 7:58:31 AM By: [name] Ham MD Entered By: [name] on 02/19/2016 14:43:04 [name] ([id number]) -------------------------------------------------------------------------------- Debridement Details Patient Name: [name] Date of Service: 02/19/2016 1:30 PM Medical Record Patient Account Number: [id number] Number: Treating RN: [name] Dec 13, 1921 (82 y.o. Other Clinician: Date of Birth/Sex: Female) Treating [name] Primary Care Physician/Extender: [name] Physician: Referring Physician: Joylene [name] in Treatment: 1 Debridement Performed for Wound #5 Left,Proximal Lower Leg Assessment: Performed By: Physician [name] Dillon, MD Debridement: Debridement Pre-procedure Yes Verification/Time Out Taken: Start Time: 13:57 Pain Control: Other : lidocaine 4% cream Level: Skin/Subcutaneous Tissue Total Area Debrided (L x 1.2 (cm) x 0.8 (cm) =  0.96 (cm) W): Tissue and other Viable, Non-Viable, Exudate, Fibrin/Slough, Subcutaneous material debrided: Instrument: Curette Bleeding: Minimum Hemostasis Achieved: Pressure End Time: 14:00 Procedural Pain: 0 Post Procedural Pain: 0 Response to Treatment: Procedure was tolerated well Post Debridement Measurements of Total Wound Length: (cm) 1.2 Width: (cm) 0.9 Depth: (cm) 0.2 Volume: (cm) 0.17 Post Procedure Diagnosis Same as Pre-procedure Electronic Signature(s) Signed: 02/19/2016 4:28:40 PM By: [name] Signed: 02/20/2016 7:58:31 AM By: [name] Ham MD Entered By: [name] on 02/19/2016 14:42:32 [name] ([name], [name] ([id number]) -------------------------------------------------------------------------------- HPI Details Patient Name: [name] Date of Service: 02/19/2016 1:30 PM Medical Record Patient Account Number: [id number] Number: Treating RN: [name] July 15, 1922 (82 y.o. Other Clinician: Date of Birth/Sex: Female) Treating [name] Primary Care Physician/Extender: [name] Physician: Referring Physician: Joylene [name] in Treatment: 1 History of Present Illness HPI Description: The patient is a pleasant 82 year old with a past medical history significant for aortic valve replacement (on Coumadin), pacemaker, CHF, abdominal aortic aneurysm, and CVA. No history of diabetes or peripheral vascular disease. She underwent excision of a squamous cell cancer on her right upper arm and left dorsal foot in early August 2015 at the [name]. Shave biopsy on May 11, 2014 showed "superficially invasive squamous cell carcinoma". She subsequently underwent wide excision with clear margins. She presented to the wound clinic with full thickness open surgical wounds. No symptoms to suggest ischemic rest pain or claudication. ABI 1.1. Cultures of L foot on  August 09, 2014 grew ORSA sensitive to tigecycline. She has completed a course of doxycycline. 02/06/2015. -- This time around she had multiple furuncles over her left lower extremity and some on her right too. Was treated by her primary care physician with doxycycline and also was treated for shingles. Most of these ulcerations had healed primarily but the one on her left lower extremity lateral part is open and has continued to  drain for about a month. She has minimal pain over this area. She has been using some local ointment and a dressing has been put over this. Last year she had a full vascular workup at [facility] in [place] and saw [name] for an opinion regarding arterial insufficiency. though she had significant arterial insufficiency a major surgical procedure was not recommended because of her age. Conservative therapy was recommended. The patient has had some vascular vein surgery done about 6 years ago in this hospital. 03/26/2015 -- she now has several other issues going with a cardiac problem and has been quite worried about this. Her leg is doing fine otherwise. 04/02/2015 -- all her chest and abdominal pain turned out to be just chronic constipation and a cardiac workup was fine. She is much relieved about the news. 02/12/16; this is a patient that we have had previously in this clinic. She has severe bilateral venous hypertension with hemosiderin deposition. She also has a history of squamous cell CA with a skin having a cancer excised from her left dorsal foot previously. She is followed by Dr. Redmond Pulling at the skin [facility] in [place]. She also appears to of had a history of lower extremity wounds probably related to venous insufficiency. I note that she has a history of arterial insufficiency by looking at the notes above. Her ABIs were noncompressible in this clinic. Her last arterial Doppler in [facility] from 2014 suggested monophasic waves  bilaterally. She had a right ABI of 0.68 and a left of 0.78. She had diffuse plaque throughout the bilateral femoral-popliteal large artery system no flow was adequately detected within the Elsie, [name]. ([id number]) right proximal popliteal suggestive of total occlusion. I don't think she was ever attempted to be revascularized in any case the history is that she has had nonhealing wounds on the left leg for 2 months. These apparently almost healed over using honey that they had at home. Ever she bumped the leg against the bed and they reopened. She also has excoriated lesions over her predominant right lateral thigh she scratches these continuously. She sews me small raised areas on her legs which she states resulted in the wounds that she has on the left leg 02/19/16; the patient arrives with the condition of her wound largely unchanged. She saw her dermatologist last week who also looked at this wound. Apparently she went and had an biopsied an area on her left upper arm the thought that the biopsy result of the arm may give [place] insight into the pathogenesis of the wound just below her fibular head were at least that's what I understand. She also has significant surrounding venous insufficiency. Multiple skin cancers. Known PAD Electronic Signature(s) Signed: 02/20/2016 7:58:31 AM By: [name] Ham MD Entered By: [name] on 02/19/2016 14:44:54 [name] ([id number]) -------------------------------------------------------------------------------- Physical Exam Details Patient Name: [name] Date of Service: 02/19/2016 1:30 PM Medical Record Patient Account Number: [id number] Number: Treating RN: [name] July 09, 1922 (82 y.o. Other Clinician: Date of Birth/Sex: Female) Treating Kodi Steil Primary Care Physician/Extender: [name] Physician: Referring Physician: Joylene [name] in Treatment: 1 Notes The wound is  largely unchanged just below the left fibular head. This has some depth covered in surface slough and surrounding eschar. All of this debridement. The wound does not look unhealthy there is no subcutaneous involvement of this that would dictate an obvious malignancy. He has surrounding venous insufficiency however this is not an obvious venous insufficiency wound  Electronic Signature(s) Signed: 02/20/2016 7:58:31 AM By: [name] Ham MD Entered By: [name] on 02/19/2016 14:50:28 [name] ([id number]) -------------------------------------------------------------------------------- Physician Orders Details Patient Name: [name] Date of Service: 02/19/2016 1:30 PM Medical Record Patient Account Number: [id number] Number: Treating RN: [name] 1922-07-21 (82 y.o. Other Clinician: Date of Birth/Sex: Female) Treating Lechelle Wrigley Primary Care Physician/Extender: [name] Physician: Referring Physician: Joylene [name] in Treatment: 1 Verbal / Phone Orders: Yes Clinician: Pinkerton, Debi Read Back and Verified: Yes Diagnosis Coding Wound Cleansing Wound #5 Left,Proximal Lower Leg o Clean wound with Normal Saline. o Cleanse wound with mild soap and water Anesthetic Wound #5 Left,Proximal Lower Leg o Topical Lidocaine 4% cream applied to wound bed prior to debridement Skin Barriers/Peri-Wound Care Wound #5 Left,Proximal Lower Leg o Skin Prep Primary Wound Dressing Wound #5 Left,Proximal Lower Leg o Aquacel Ag Secondary Dressing Wound #5 Left,Proximal Lower Leg o Boardered Foam Dressing Dressing Change Frequency Wound #5 Left,Proximal Lower Leg o Change dressing every other day. Follow-up Appointments Wound #5 Left,Proximal Lower Leg o Return Appointment in 1 week. Edema Control Wound #5 Left,Proximal Lower Leg o Elevate legs to the level of the heart and pump ankles as often as  possible Galka, Nahomi G. ([id number]) Additional Orders / Instructions Wound #5 Left,Proximal Lower Leg o Increase protein intake. Medications-please add to medication list. Wound #5 Left,Proximal Lower Leg o Other: - Vitamin C, Zinc Multivitamin Electronic Signature(s) Signed: 02/19/2016 4:28:40 PM By: [name] Signed: 02/20/2016 7:58:31 AM By: [name] Ham MD Entered By: [name] on 02/19/2016 14:09:55 [name] ([id number]) -------------------------------------------------------------------------------- Problem List Details Patient Name: [name], [name] Date of Service: 02/19/2016 1:30 PM Medical Record Patient Account Number: [id number] Number: Treating RN: [name] Dec 08, 1921 (82 y.o. Other Clinician: Date of Birth/Sex: Female) Treating Roldan Laforest Primary Care Physician/Extender: [name] Physician: Referring Physician: Joylene [name] in Treatment: 1 Active Problems ICD-10 Encounter Code Description Active Date Diagnosis I87.333 Chronic venous hypertension (idiopathic) with ulcer and 02/12/2016 Yes inflammation of bilateral lower extremity C44.82 Squamous cell carcinoma of overlapping sites of skin 02/12/2016 Yes I48.2 Chronic atrial fibrillation 02/12/2016 Yes Inactive Problems Resolved Problems Electronic Signature(s) Signed: 02/20/2016 7:58:31 AM By: [name] Ham MD Entered By: [name] on 02/19/2016 14:42:16 [name] ([id number]) -------------------------------------------------------------------------------- Progress Note Details Patient Name: [name] Date of Service: 02/19/2016 1:30 PM Medical Record Patient Account Number: [id number] Number: Treating RN: [name] 1922/06/29 (82 y.o. Other Clinician: Date of Birth/Sex: Female) Treating Trevon Strothers Primary Care Physician/Extender: [name] Physician: Referring Physician:  Joylene [name] in Treatment: 1 Subjective Chief Complaint Information obtained from Patient Patient returns to the wound care center for new ulcer to: left lower extremity lateral part of the calf. This has been there for about a month. About 4 days ago she injured her right calf area when she was applying some ointment and she may have scratched herself with a nail. 02/12/16; patient returns today for a two- month history of a nonhealing ulcer on her upper left lateral leg just below the fibular head History of Present Illness (HPI) The patient is a pleasant 82 year old with a past medical history significant for aortic valve replacement (on Coumadin), pacemaker, CHF, abdominal aortic aneurysm, and CVA. No history of diabetes or peripheral vascular disease. She underwent excision of a squamous cell cancer on her right upper arm and left dorsal foot in early August 2015 at the [facility]. Shave  biopsy on May 11, 2014 showed "superficially invasive squamous cell carcinoma". She subsequently underwent wide excision with clear margins. She presented to the wound clinic with full thickness open surgical wounds. No symptoms to suggest ischemic rest pain or claudication. ABI 1.1. Cultures of L foot on August 09, 2014 grew ORSA sensitive to tigecycline. She has completed a course of doxycycline. 02/06/2015. -- This time around she had multiple furuncles over her left lower extremity and some on her right too. Was treated by her primary care physician with doxycycline and also was treated for shingles. Most of these ulcerations had healed primarily but the one on her left lower extremity lateral part is open and has continued to drain for about a month. She has minimal pain over this area. She has been using some local ointment and a dressing has been put over this. Last year she had a full vascular workup at [facility] in [place] and saw [name] for an  opinion regarding arterial insufficiency. though she had significant arterial insufficiency a major surgical procedure was not recommended because of her age. Conservative therapy was recommended. The patient has had some vascular vein surgery done about 6 years ago in this hospital. 03/26/2015 -- she now has several other issues going with a cardiac problem and has been quite worried about this. Her leg is doing fine otherwise. 04/02/2015 -- all her chest and abdominal pain turned out to be just chronic constipation and a cardiac workup was fine. She is much relieved about the news. [name], [name] ([id number]) 02/12/16; this is a patient that we have had previously in this clinic. She has severe bilateral venous hypertension with hemosiderin deposition. She also has a history of squamous cell CA with a skin having a cancer excised from her left dorsal foot previously. She is followed by Dr. Redmond Pulling at the skin [facility] in [place]. She also appears to of had a history of lower extremity wounds probably related to venous insufficiency. I note that she has a history of arterial insufficiency by looking at the notes above. Her ABIs were noncompressible in this clinic. Her last arterial Doppler in [facility] from 2014 suggested monophasic waves bilaterally. She had a right ABI of 0.68 and a left of 0.78. She had diffuse plaque throughout the bilateral femoral-popliteal large artery system no flow was adequately detected within the right proximal popliteal suggestive of total occlusion. I don't think she was ever attempted to be revascularized in any case the history is that she has had nonhealing wounds on the left leg for 2 months. These apparently almost healed over using honey that they had at home. Ever she bumped the leg against the bed and they reopened. She also has excoriated lesions over her predominant right lateral thigh she scratches these continuously. She sews me small  raised areas on her legs which she states resulted in the wounds that she has on the left leg 02/19/16; the patient arrives with the condition of her wound largely unchanged. She saw her dermatologist last week who also looked at this wound. Apparently she went and had an biopsied an area on her left upper arm the thought that the biopsy result of the arm may give [place] insight into the pathogenesis of the wound just below her fibular head were at least that's what I understand. She also has significant surrounding venous insufficiency. Multiple skin cancers. Known PAD Objective Constitutional Vitals Time Taken: 1:36 PM, Height: 63 in, Weight: 142 lbs, BMI: 25.2,  Pulse: 81 bpm, Respiratory Rate: 18 breaths/min, Blood Pressure: 132/88 mmHg. Integumentary (Hair, Skin) Wound #5 status is Open. Original cause of wound was Trauma. The wound is located on the Left,Proximal Lower Leg. The wound measures 1.2cm length x 0.8cm width x 0.1cm depth; 0.754cm^2 area and 0.075cm^3 volume. The wound is limited to skin breakdown. There is no tunneling or undermining noted. There is a large amount of serosanguineous drainage noted. The wound margin is flat and intact. There is small (1-33%) red granulation within the wound bed. There is a large (67-100%) amount of necrotic tissue within the wound bed including Eschar and Adherent Slough. The periwound skin appearance exhibited: Localized Edema, Moist, Erythema. The surrounding wound skin color is noted with erythema which is circumferential. Periwound temperature was noted as No Abnormality. The periwound has tenderness on palpation. Wound #6 status is Healed - Epithelialized. Original cause of wound was Trauma. The wound is located on the Distal Lower Leg. The wound measures 0cm length x 0cm width x 0cm depth; 0cm^2 area and 0cm^3 volume. The wound is limited to skin breakdown. There is no tunneling or undermining noted. There is a [name], [name].  ([id number]) large amount of serosanguineous drainage noted. The wound margin is flat and intact. There is no granulation within the wound bed. There is no necrotic tissue within the wound bed. The periwound skin appearance exhibited: Erythema. The periwound skin appearance did not exhibit: Moist. The surrounding wound skin color is noted with erythema which is circumferential. Periwound temperature was noted as No Abnormality. The periwound has tenderness on palpation. Assessment Active Problems ICD-10 I87.333 - Chronic venous hypertension (idiopathic) with ulcer and inflammation of bilateral lower extremity C44.82 - Squamous cell carcinoma of overlapping sites of skin I48.2 - Chronic atrial fibrillation Procedures Wound #5 Wound #5 is a Trauma, Other located on the Left,Proximal Lower Leg . There was a Skin/Subcutaneous Tissue Debridement [id number]) debridement with total area of 0.96 sq cm performed by [name] Dillon, MD. with the following instrument(s): Curette to remove Viable and Non-Viable tissue/material including Exudate, Fibrin/Slough, and Subcutaneous after achieving pain control using Other (lidocaine 4% cream). A time out was conducted prior to the start of the procedure. A Minimum amount of bleeding was controlled with Pressure. The procedure was tolerated well with a pain level of 0 throughout and a pain level of 0 following the procedure. Post Debridement Measurements: 1.2cm length x 0.9cm width x 0.2cm depth; 0.17cm^3 volume. Post procedure Diagnosis Wound #5: Same as Pre-Procedure Plan Wound Cleansing: Wound #5 Left,Proximal Lower Leg: Clean wound with Normal Saline. Cleanse wound with mild soap and water Anesthetic: Wound #5 Left,Proximal Lower Leg: [name], [name] ([id number]) Topical Lidocaine 4% cream applied to wound bed prior to debridement Skin Barriers/Peri-Wound Care: Wound #5 Left,Proximal Lower Leg: Skin Prep Primary Wound Dressing: Wound #5  Left,Proximal Lower Leg: Aquacel Ag Secondary Dressing: Wound #5 Left,Proximal Lower Leg: Boardered Foam Dressing Dressing Change Frequency: Wound #5 Left,Proximal Lower Leg: Change dressing every other day. Follow-up Appointments: Wound #5 Left,Proximal Lower Leg: Return Appointment in 1 week. Edema Control: Wound #5 Left,Proximal Lower Leg: Elevate legs to the level of the heart and pump ankles as often as possible Additional Orders / Instructions: Wound #5 Left,Proximal Lower Leg: Increase protein intake. Medications-please add to medication list.: Wound #5 Left,Proximal Lower Leg: Other: - Vitamin C, Zinc Multivitamin #1 we reapplied Aquacel Ag to this area under her border foam dressing. If this progresses towards healing I will forego a biopsy and/or  await further instructions from his dermatologist. I have also wondered about putting this under some form of compression. The patient's niece/caregiver says the patient has a habit of removing dressing scratching at wounds etc. although the patient complains that she has been compliant of late Electronic Signature(s) Signed: 02/20/2016 7:58:31 AM By: [name] Ham MD Entered By: [name] on 02/19/2016 14:52:41 [name] ([id number]) -------------------------------------------------------------------------------- SuperBill Details Patient Name: [name] Date of Service: 02/19/2016 Medical Record Patient Account Number: [id number] Number: Treating RN: [name] Jul 23, 1922 (82 y.o. Other Clinician: Date of Birth/Sex: Female) Treating Ludie Hudon Primary Care Physician/Extender: [name] Physician: Weeks in Treatment: 1 Referring Physician: Pernell Dupre Diagnosis Coding ICD-10 Codes Code Description Chronic venous hypertension (idiopathic) with ulcer and inflammation of bilateral lower I87.333 extremity C44.82 Squamous cell carcinoma of overlapping sites of  skin I48.2 Chronic atrial fibrillation Facility Procedures CPT4: Description Modifier Quantity Code [id number] - DEB SUBQ TISSUE 20 SQ CM/< 1 ICD-10 Description Diagnosis I87.333 Chronic venous hypertension (idiopathic) with ulcer and inflammation of bilateral lower extremity Physician Procedures CPT4: Description Modifier Quantity Code [id number] - WC PHYS SUBQ TISS 20 SQ CM 1 ICD-10 Description Diagnosis I87.333 Chronic venous hypertension (idiopathic) with ulcer and inflammation of bilateral lower extremity Electronic Signature(s) Signed: 02/20/2016 7:58:31 AM By: [name] Ham MD Entered By: [name] on 02/19/2016 14:53:05

## 2016-02-20 NOTE — Progress Notes (Signed)
[name], [name] ([id number]) Visit Report for 02/19/2016 Arrival Information Details Patient Name: [name], [name] Date of Service: 02/19/2016 1:30 PM Medical Record Patient Account Number: [id number] Number: Treating RN: Ahmed Prima [birth year] (82 y.o. Other Clinician: Date of Birth/Sex: Female) Treating ROBSON, [place] Primary Care Physician: Pernell Dupre Physician/Extender: G Referring Physician: Joylene John in Treatment: 1 Visit Information History Since Last Visit All ordered tests and consults were completed: No Patient Arrived: Wheel Chair Added or deleted any medications: No Arrival Time: 13:29 Any new allergies or adverse reactions: No Accompanied By: niece Had a fall or experienced change in No Transfer Assistance: EasyPivot Patient activities of daily living that may affect Lift risk of falls: Patient Identification Verified: Yes Signs or symptoms of abuse/neglect since last No Secondary Verification Process Yes visito Completed: Hospitalized since last visit: No Patient Requires Transmission- No Pain Present Now: No Based Precautions: Patient Has Alerts: Yes Patient Alerts: Patient on Blood Thinner Coumadin Electronic Signature(s) Signed: 02/19/2016 4:28:40 PM By: Alric Quan Entered By: Alric Quan on 02/19/2016 13:31:27 [name] ([id number]) -------------------------------------------------------------------------------- Encounter Discharge Information Details Patient Name: [name] Date of Service: 02/19/2016 1:30 PM Medical Record Patient Account Number: [id number] Number: Treating RN: Ahmed Prima Jul 03, 1922 (82 y.o. Other Clinician: Date of Birth/Sex: Female) Treating ROBSON, [place] Primary Care Physician: Pernell Dupre Physician/Extender: G Referring Physician: Joylene John in Treatment: 1 Encounter Discharge Information Items Discharge Pain Level:  0 Discharge Condition: Stable Ambulatory Status: Wheelchair Discharge Destination: Home Transportation: Private Auto Accompanied By: niece Schedule Follow-up Appointment: Yes Medication Reconciliation completed Yes and provided to Patient/Care [name]: Provided on Clinical Summary of Care: 02/19/2016 Form Type Recipient Paper Patient JW Electronic Signature(s) Signed: 02/19/2016 2:12:14 PM By: Ruthine Dose Entered By: Ruthine Dose on 02/19/2016 14:12:14 [name] ([id number]) -------------------------------------------------------------------------------- Lower Extremity Assessment Details Patient Name: [name] Date of Service: 02/19/2016 1:30 PM Medical Record Patient Account Number: [id number] Number: Treating RN: Ahmed Prima Jul 20, 1922 (82 y.o. Other Clinician: Date of Birth/Sex: Female) Treating ROBSON, MICHAEL Primary Care Physician: Pernell Dupre Physician/Extender: G Referring Physician: Joylene John in Treatment: 1 Vascular Assessment Pulses: Posterior Tibial Dorsalis Pedis Palpable: [Left:No] Doppler: [Left:Monophasic] Extremity colors, hair growth, and conditions: Temperature of Extremity: [Left:Warm] Capillary Refill: [Left:< 3 seconds] Toe Nail Assessment Left: Right: Thick: No Discolored: No Deformed: No Improper Length and Hygiene: No Electronic Signature(s) Signed: 02/19/2016 4:28:40 PM By: Alric Quan Entered By: Alric Quan on 02/19/2016 13:39:37 [name] ([id number]) -------------------------------------------------------------------------------- Multi Wound Chart Details Patient Name: [name] Date of Service: 02/19/2016 1:30 PM Medical Record Patient Account Number: [id number] Number: Treating RN: Ahmed Prima 20-Aug-1922 (82 y.o. Other Clinician: Date of Birth/Sex: Female) Treating ROBSON, [place] Primary Care Physician: Pernell Dupre Physician/Extender: G Referring Physician: Joylene John in Treatment: 1 Vital Signs Height(in): 63 Pulse(bpm): 81 Weight(lbs): 142 Blood Pressure 132/88 (mmHg): Body Mass Index(BMI): 25 Temperature(F): Respiratory Rate 18 (breaths/min): Photos: [5:No Photos] [6:No Photos] [N/A:N/A] Wound Location: [5:Left Lower Leg - Proximal Lower Leg - Distal] [N/A:N/A] Wounding Event: [5:Trauma] [6:Trauma] [N/A:N/A] Primary Etiology: [5:Trauma, Other] [6:Trauma, Other] [N/A:N/A] Comorbid History: [5:Cataracts, Angina, Arrhythmia, Congestive Arrhythmia, Congestive Heart Failure, Hypertension, Hepatitis B, Hypertension, Hepatitis B, Osteoarthritis, Seizure Disorder] [6:Cataracts, Angina, Heart Failure, Osteoarthritis, Seizure  Disorder] [N/A:N/A] Date Acquired: [5:12/15/2015] [6:12/15/2015] [N/A:N/A] Weeks of Treatment: [5:1] [6:1] [N/A:N/A] Wound Status: [5:Open] [6:Open] [N/A:N/A] Measurements L x W x D 1.2x0.8x0.1 [6:0.1x0.1x0.1] [N/A:N/A] (cm) Area (cm) : [5:0.754] [id number] [N/A:N/A] Volume (cm) : [5:0.075] [6:0.001] [N/A:N/A] %  Reduction in Area: [5:59.70%] [6:97.70%] [N/A:N/A] % Reduction in Volume: 59.90% [6:97.10%] [N/A:N/A] Classification: [5:Partial Thickness] [6:Partial Thickness] [N/A:N/A] Exudate Amount: [5:Large] [6:Large] [N/A:N/A] Exudate Type: [5:Serosanguineous] [6:Serosanguineous] [N/A:N/A] Exudate Color: [5:red, brown] [6:red, brown] [N/A:N/A] Wound Margin: [5:Flat and Intact] [6:Flat and Intact] [N/A:N/A] Granulation Amount: [5:Small (1-33%)] [6:None Present (0%)] [N/A:N/A] Granulation Quality: [5:Red] [6:N/A] [N/A:N/A] Necrotic Amount: [5:Large (67-100%)] [6:Large (67-100%)] [N/A:N/A] Necrotic Tissue: [5:Eschar, Adherent Warren, Adherent Slough N/A] Exposed Structures: Fascia: No Fascia: No N/A Fat: No Fat: No Tendon: No Tendon: No Muscle: No Muscle: No Joint: No Joint: No Bone: No Bone: No Limited to Skin Limited to  Skin Breakdown Breakdown Epithelialization: None None N/A Periwound Skin Texture: Edema: Yes Edema: Yes N/A Periwound Skin Moist: Yes Moist: Yes N/A Moisture: Periwound Skin Color: Erythema: Yes Erythema: Yes N/A Erythema Location: Circumferential Circumferential N/A Temperature: No Abnormality No Abnormality N/A Tenderness on Yes Yes N/A Palpation: Wound Preparation: Ulcer Cleansing: Ulcer Cleansing: N/A Rinsed/Irrigated with Rinsed/Irrigated with Saline Saline Topical Anesthetic Topical Anesthetic Applied: Other: lidocaine Applied: Other: lidocaine 4% 4% Treatment Notes Electronic Signature(s) Signed: 02/19/2016 4:28:40 PM By: Alric Quan Entered By: Alric Quan on 02/19/2016 13:45:50 [name] ([id number]) -------------------------------------------------------------------------------- [place] Details Patient Name: [name] Date of Service: 02/19/2016 1:30 PM Medical Record Patient Account Number: [id number] Number: Treating RN: Ahmed Prima 1922-01-16 (82 y.o. Other Clinician: Date of Birth/Sex: Female) Treating ROBSON, [place] Primary Care Physician: Pernell Dupre Physician/Extender: G Referring Physician: Joylene John in Treatment: 1 Active Inactive Abuse / Safety / Falls / Self Care Management Nursing Diagnoses: Potential for falls Goals: Patient will remain injury free Date Initiated: 02/12/2016 Goal Status: Active Interventions: Assess fall risk on admission and as needed Notes: Nutrition Nursing Diagnoses: Imbalanced nutrition Goals: Patient/caregiver agrees to and verbalizes understanding of need to use nutritional supplements and/or vitamins as prescribed Date Initiated: 02/12/2016 Goal Status: Active Interventions: Assess patient nutrition upon admission and as needed per policy Notes: Orientation to the Wound Care Program Nursing Diagnoses: Knowledge deficit related to  the wound healing center program SHAW, ALICE ([id number]) Goals: Patient/caregiver will verbalize understanding of the [facility] Date Initiated: 02/12/2016 Goal Status: Active Interventions: Provide education on orientation to the wound center Notes: Soft Tissue Infection Nursing Diagnoses: Impaired tissue integrity Goals: Patient/caregiver will verbalize understanding of or measures to prevent infection and contamination in the home setting Date Initiated: 02/12/2016 Goal Status: Active Interventions: Assess signs and symptoms of infection every visit Notes: Wound/Skin Impairment Nursing Diagnoses: Impaired tissue integrity Goals: Ulcer/skin breakdown will have a volume reduction of 30% by week 4 Date Initiated: 02/12/2016 Goal Status: Active Ulcer/skin breakdown will have a volume reduction of 50% by week 8 Date Initiated: 02/12/2016 Goal Status: Active Ulcer/skin breakdown will have a volume reduction of 80% by week 12 Date Initiated: 02/12/2016 Goal Status: Active Interventions: Assess patient/caregiver ability to obtain necessary supplies Assess ulceration(s) every visit APURVA, GAROFALO ([id number]) Notes: Electronic Signature(s) Signed: 02/19/2016 4:28:40 PM By: Alric Quan Entered By: Alric Quan on 02/19/2016 14:01:26 [name] ([id number]) -------------------------------------------------------------------------------- Pain Assessment Details Patient Name: [name] Date of Service: 02/19/2016 1:30 PM Medical Record Patient Account Number: [id number] Number: Treating RN: Ahmed Prima 1922/09/20 (82 y.o. Other Clinician: Date of Birth/Sex: Female) Treating ROBSON, MICHAEL Primary Care Physician: Pernell Dupre Physician/Extender: G Referring Physician: Joylene John in Treatment: 1 Active Problems Location of Pain Severity and Description of Pain Patient Has Paino No Site  Locations Pain Management and Medication Current  Pain Management: Electronic Signature(s) Signed: 02/19/2016 4:28:40 PM By: Alric Quan Entered By: Alric Quan on 02/19/2016 13:31:51 [name] ([id number]) -------------------------------------------------------------------------------- Patient/Caregiver Education Details Patient Name: [name] Date of Service: 02/19/2016 1:30 PM Medical Record Patient Account Number: [id number] Number: Treating RN: Ahmed Prima [birth year] (82 y.o. Other Clinician: Date of Birth/Gender: Female) Treating ROBSON, [place] Primary Care Physician: Pernell Dupre Physician/Extender: G Referring Physician: Joylene John in Treatment: 1 Education Assessment Education Provided To: Patient Education Topics Provided Wound/Skin Impairment: Handouts: Other: chnage dressing as ordered Methods: Demonstration, Explain/Verbal Responses: State content correctly Electronic Signature(s) Signed: 02/19/2016 4:28:40 PM By: Alric Quan Entered By: Alric Quan on 02/19/2016 14:11:38 [name] ([id number]) -------------------------------------------------------------------------------- Wound Assessment Details Patient Name: [name] Date of Service: 02/19/2016 1:30 PM Medical Record Patient Account Number: [id number] Number: Treating RN: Ahmed Prima [birth year] (82 y.o. Other Clinician: Date of Birth/Sex: Female) Treating ROBSON, [place] Primary Care Physician: Pernell Dupre Physician/Extender: G Referring Physician: Joylene John in Treatment: 1 Wound Status Wound Number: 5 Primary Trauma, Other Etiology: Wound Location: Left Lower Leg - Proximal Wound Open Wounding Event: Trauma Status: Date Acquired: 12/15/2015 Comorbid Cataracts, Angina, Arrhythmia, Weeks Of Treatment: 1 History: Congestive Heart Failure, Hypertension, Clustered Wound: No Hepatitis  B, Osteoarthritis, Seizure Disorder Photos Photo Uploaded By: Alric Quan on 02/19/2016 15:07:09 Wound Measurements Length: (cm) 1.2 Width: (cm) 0.8 Depth: (cm) 0.1 Area: (cm) 0.754 Volume: (cm) 0.075 % Reduction in Area: 59.7% % Reduction in Volume: 59.9% Epithelialization: None Tunneling: No Undermining: No Wound Description Classification: Partial Thickness Foul Odor Afte Wound Margin: Flat and Intact Exudate Amount: Large Exudate Type: Serosanguineous Exudate Color: red, brown r Cleansing: No Wound Bed Granulation Amount: Small (1-33%) Exposed Structure NADYIA, HANNEKEN ([id number]) Granulation Quality: Red Fascia Exposed: No Necrotic Amount: Large (67-100%) Fat Layer Exposed: No Necrotic Quality: Eschar, Adherent Slough Tendon Exposed: No Muscle Exposed: No Joint Exposed: No Bone Exposed: No Limited to Skin Breakdown Periwound Skin Texture Texture Color No Abnormalities Noted: No No Abnormalities Noted: No Localized Edema: Yes Erythema: Yes Erythema Location: Circumferential Moisture No Abnormalities Noted: No Temperature / Pain Moist: Yes Temperature: No Abnormality Tenderness on Palpation: Yes Wound Preparation Ulcer Cleansing: Rinsed/Irrigated with Saline Topical Anesthetic Applied: Other: lidocaine 4%, Treatment Notes Wound #5 (Left, Proximal Lower Leg) 1. Cleansed with: Clean wound with Normal Saline 2. Anesthetic Topical Lidocaine 4% cream to wound bed prior to debridement 3. Peri-wound Care: Skin Prep 4. Dressing Applied: Aquacel Ag 5. Secondary Dressing Applied Bordered Foam Dressing Electronic Signature(s) Signed: 02/19/2016 4:28:40 PM By: Alric Quan Entered By: Alric Quan on 02/19/2016 13:43:58 [name] ([id number]) -------------------------------------------------------------------------------- Wound Assessment Details Patient Name: [name] Date of Service: 02/19/2016 1:30 PM Medical Record  Patient Account Number: [id number] Number: Treating RN: Ahmed Prima 12-19-1921 (82 y.o. Other Clinician: Date of Birth/Sex: Female) Treating ROBSON, [place] Primary Care Physician: Pernell Dupre Physician/Extender: G Referring Physician: Joylene John in Treatment: 1 Wound Status Wound Number: 6 Primary Trauma, Other Etiology: Wound Location: Lower Leg - Distal Wound Healed - Epithelialized Wounding Event: Trauma Status: Date Acquired: 12/15/2015 Comorbid Cataracts, Angina, Arrhythmia, Weeks Of Treatment: 1 History: Congestive Heart Failure, Hypertension, Clustered Wound: No Hepatitis B, Osteoarthritis, Seizure Disorder Photos Photo Uploaded By: Alric Quan on 02/19/2016 15:07:10 Wound Measurements Length: (cm) 0 % Reduction in Width: (cm) 0 % Reduction in Depth: (cm) 0 Epithelializati Area: (cm) 0 Tunneling: Volume: (cm) 0 Undermining: Area: 100% Volume: 100% on: Large (67-100%) No No Wound Description  Classification: Partial Thickness Foul Odor Afte Wound Margin: Flat and Intact Exudate Amount: Large Exudate Type: Serosanguineous Exudate Color: red, brown r Cleansing: No Wound Bed Granulation Amount: None Present (0%) Exposed Structure BRIENNE, POSTHUMUS ([id number]) Necrotic Amount: None Present (0%) Fascia Exposed: No Fat Layer Exposed: No Tendon Exposed: No Muscle Exposed: No Joint Exposed: No Bone Exposed: No Limited to Skin Breakdown Periwound Skin Texture Texture Color No Abnormalities Noted: No No Abnormalities Noted: No Erythema: Yes Moisture Erythema Location: Circumferential No Abnormalities Noted: No Moist: No Temperature / Pain Temperature: No Abnormality Tenderness on Palpation: Yes Wound Preparation Ulcer Cleansing: Rinsed/Irrigated with Saline Electronic Signature(s) Signed: 02/19/2016 4:28:40 PM By: Alric Quan Entered By: Alric Quan on 02/19/2016 14:01:13 [name]  ([id number]) -------------------------------------------------------------------------------- Vitals Details Patient Name: [name] Date of Service: 02/19/2016 1:30 PM Medical Record Patient Account Number: [id number] Number: Treating RN: Ahmed Prima [birth year] (82 y.o. Other Clinician: Date of Birth/Sex: Female) Treating ROBSON, MICHAEL Primary Care Physician: Pernell Dupre Physician/Extender: G Referring Physician: Joylene John in Treatment: 1 Vital Signs Time Taken: 13:36 Pulse (bpm): 81 Height (in): 63 Respiratory Rate (breaths/min): 18 Weight (lbs): 142 Blood Pressure (mmHg): 132/88 Body Mass Index (BMI): 25.2 Reference Range: 80 - 120 mg / dl Electronic Signature(s) Signed: 02/19/2016 4:28:40 PM By: Alric Quan Entered By: Alric Quan on 02/19/2016 13:38:38

## 2016-02-21 ENCOUNTER — Ambulatory Visit
Admission: RE | Admit: 2016-02-21 | Discharge: 2016-02-21 | Disposition: A | Discharge status: Home / Self Care | Payer: Medicare Other | Source: Ambulatory Visit | Attending: Student | Admitting: Student

## 2016-02-21 DIAGNOSIS — I2510 Atherosclerotic heart disease of native coronary artery without angina pectoris: Secondary | ICD-10-CM | POA: Insufficient documentation

## 2016-02-21 DIAGNOSIS — K5900 Constipation, unspecified: Secondary | ICD-10-CM

## 2016-02-21 DIAGNOSIS — I671 Cerebral aneurysm, nonruptured: Secondary | ICD-10-CM

## 2016-02-21 DIAGNOSIS — R110 Nausea: Secondary | ICD-10-CM

## 2016-02-21 DIAGNOSIS — R42 Dizziness and giddiness: Secondary | ICD-10-CM | POA: Insufficient documentation

## 2016-02-21 LAB — POCT I-STAT CREATININE: Creatinine, Ser: 1.1 mg/dL — ABNORMAL HIGH (ref 0.44–1.00)

## 2016-02-21 MED ORDER — IOPAMIDOL (ISOVUE-370) INJECTION 76%
80.0000 mL | Freq: Once | INTRAVENOUS | Status: AC | PRN
Start: 2016-02-21 — End: 2016-02-21
  Administered 2016-02-21: 80 mL via INTRAVENOUS

## 2016-02-22 ENCOUNTER — Encounter: Payer: Self-pay | Admitting: Cardiology

## 2016-02-26 ENCOUNTER — Ambulatory Visit: Payer: Medicare Other | Admitting: Internal Medicine

## 2016-03-05 ENCOUNTER — Encounter: Payer: Medicare Other | Admitting: Internal Medicine

## 2016-03-05 ENCOUNTER — Ambulatory Visit (INDEPENDENT_AMBULATORY_CARE_PROVIDER_SITE_OTHER): Payer: Medicare Other

## 2016-03-05 DIAGNOSIS — G459 Transient cerebral ischemic attack, unspecified: Secondary | ICD-10-CM | POA: Diagnosis not present

## 2016-03-05 DIAGNOSIS — I359 Nonrheumatic aortic valve disorder, unspecified: Secondary | ICD-10-CM

## 2016-03-05 DIAGNOSIS — I87333 Chronic venous hypertension (idiopathic) with ulcer and inflammation of bilateral lower extremity: Secondary | ICD-10-CM | POA: Diagnosis not present

## 2016-03-05 DIAGNOSIS — I4891 Unspecified atrial fibrillation: Secondary | ICD-10-CM | POA: Diagnosis not present

## 2016-03-05 DIAGNOSIS — Z954 Presence of other heart-valve replacement: Secondary | ICD-10-CM

## 2016-03-05 DIAGNOSIS — Z5181 Encounter for therapeutic drug level monitoring: Secondary | ICD-10-CM | POA: Diagnosis not present

## 2016-03-05 DIAGNOSIS — Z953 Presence of xenogenic heart valve: Secondary | ICD-10-CM

## 2016-03-05 LAB — POCT INR: INR: 2.6

## 2016-03-06 NOTE — Progress Notes (Signed)
[name], [name] ([id number]) Visit Report for 03/05/2016 Arrival Information Details Patient Name: [name], [name]. Date of Service: 03/05/2016 3:45 PM Medical Record Patient Account Number: [id number] Number: Treating RN: Ahmed Prima 31-Mar-1922 (82 y.o. Other Clinician: Date of Birth/Sex: Female) Treating ROBSON, [place] Primary Care Physician: Pernell Dupre Physician/Extender: G Referring Physician: Joylene John in Treatment: 3 Visit Information History Since Last Visit All ordered tests and consults were completed: No Patient Arrived: Wheel Chair Added or deleted any medications: No Arrival Time: 15:29 Any new allergies or adverse reactions: No Accompanied By: niece Had a fall or experienced change in No Transfer Assistance: EasyP[employer] Patient activities of daily living that may affect Lift risk of falls: Patient Identification Verified: Yes Signs or symptoms of abuse/neglect since last No Secondary Verification Process Yes visito Completed: Hospitalized since last visit: No Patient Requires Transmission- No Pain Present Now: No Based Precautions: Patient Has Alerts: Yes Patient Alerts: Patient on Blood Thinner Coumadin Electronic Signature(s) Signed: 03/05/2016 4:35:35 PM By: Alric Quan Entered By: Alric Quan on 03/05/2016 15:30:26 [name] ([id number]) -------------------------------------------------------------------------------- Encounter Discharge Information Details Patient Name: [name]. Date of Service: 03/05/2016 3:45 PM Medical Record Patient Account Number: [id number] Number: Treating RN: Ahmed Prima 05-Jul-1922 (82 y.o. Other Clinician: Date of Birth/Sex: Female) Treating ROBSON, MICHAEL Primary Care Physician: Pernell Dupre Physician/Extender: G Referring Physician: Joylene John in Treatment: 3 Encounter Discharge Information Items Discharge Pain Level:  0 Discharge Condition: Stable Ambulatory Status: Wheelchair Discharge Destination: Home Transportation: Private Auto Accompanied By: niece Schedule Follow-up Appointment: Yes Medication Reconciliation completed and provided to Patient/Care Yes Itzael Liptak: Provided on Clinical Summary of Care: 03/05/2016 Form Type Recipient Paper Patient JW Electronic Signature(s) Signed: 03/05/2016 3:56:47 PM By: Ruthine Dose Entered By: Ruthine Dose on 03/05/2016 15:56:47 [name] ([id number]) -------------------------------------------------------------------------------- Multi Wound Chart Details Patient Name: [name]. Date of Service: 03/05/2016 3:45 PM Medical Record Patient Account Number: [id number] Number: Treating RN: Ahmed Prima [birth year] (82 y.o. Other Clinician: Date of Birth/Sex: Female) Treating ROBSON, [place] Primary Care Physician: Pernell Dupre Physician/Extender: G Referring Physician: Joylene John in Treatment: 3 Photos: [5:No Photos] [N/A:N/A] Wound Location: [5:Left Lower Leg - Proximal N/A] Wounding Event: [5:Trauma] [N/A:N/A] Primary Etiology: [5:Trauma, Other] [N/A:N/A] Comorbid History: [5:Cataracts, Angina, Arrhythmia, Congestive Heart Failure, Hypertension, Hepatitis B, Osteoarthritis, Seizure Disorder] [N/A:N/A] Date Acquired: [5:12/15/2015] [N/A:N/A] Weeks of Treatment: [5:3] [N/A:N/A] Wound Status: [5:Open] [N/A:N/A] Measurements L x W x D 0.3x0.2x0.1 [N/A:N/A] (cm) Area (cm) : [5:0.047] [N/A:N/A] Volume (cm) : [5:0.005] [N/A:N/A] % Reduction in Area: [5:97.50%] [N/A:N/A] % Reduction in Volume: 97.30% [N/A:N/A] Classification: [5:Partial Thickness] [N/A:N/A] Exudate Amount: [5:Large] [N/A:N/A] Exudate Type: [5:Serosanguineous] [N/A:N/A] Exudate Color: [5:red, brown] [N/A:N/A] Wound Margin: [5:Flat and Intact] [N/A:N/A] Granulation Amount: [5:Large (67-100%)] [N/A:N/A] Granulation Quality: [5:Red]  [N/A:N/A] Necrotic Amount: [5:Small (1-33%)] [N/A:N/A] Necrotic Tissue: [5:Eschar, Adherent Slough N/A] Exposed Structures: [5:Fascia: No Fat: No Tendon: No Muscle: No Joint: No Bone: No Limited to Skin Breakdown] [N/A:N/A] Epithelialization: None N/A N/A Periwound Skin Texture: Edema: Yes N/A N/A Periwound Skin Moist: Yes N/A N/A Moisture: Periwound Skin Color: Erythema: Yes N/A N/A Erythema Location: Circumferential N/A N/A Temperature: No Abnormality N/A N/A Tenderness on Yes N/A N/A Palpation: Wound Preparation: Ulcer Cleansing: N/A N/A Rinsed/Irrigated with Saline Topical Anesthetic Applied: Other: lidocaine 4% Treatment Notes Electronic Signature(s) Signed: 03/05/2016 4:35:35 PM By: Alric Quan Entered By: Alric Quan on 03/05/2016 15:44:03 [name] ([id number]) -------------------------------------------------------------------------------- [place] Details Patient Name: [name]. Date of Service: 03/05/2016  3:45 PM Medical Record Patient Account Number: [id number] Number: Treating RN: Ahmed Prima 02/04/1922 (82 y.o. Other Clinician: Date of Birth/Sex: Female) Treating ROBSON, [place] Primary Care Physician: Pernell Dupre Physician/Extender: G Referring Physician: Joylene John in Treatment: 3 Active Inactive Abuse / Safety / Falls / Self Care Management Nursing Diagnoses: Potential for falls Goals: Patient will remain injury free Date Initiated: 02/12/2016 Goal Status: Active Interventions: Assess fall risk on admission and as needed Notes: Nutrition Nursing Diagnoses: Imbalanced nutrition Goals: Patient/caregiver agrees to and verbalizes understanding of need to use nutritional supplements and/or vitamins as prescribed Date Initiated: 02/12/2016 Goal Status: Active Interventions: Assess patient nutrition upon admission and as needed per policy Notes: Orientation to the Wound  Care Program Nursing Diagnoses: Knowledge deficit related to the wound healing center program [name], [name] ([id number]) Goals: Patient/caregiver will verbalize understanding of the [facility] Date Initiated: 02/12/2016 Goal Status: Active Interventions: Provide education on orientation to the wound center Notes: Soft Tissue Infection Nursing Diagnoses: Impaired tissue integrity Goals: Patient/caregiver will verbalize understanding of or measures to prevent infection and contamination in the home setting Date Initiated: 02/12/2016 Goal Status: Active Interventions: Assess signs and symptoms of infection every visit Notes: Wound/Skin Impairment Nursing Diagnoses: Impaired tissue integrity Goals: Ulcer/skin breakdown will have a volume reduction of 30% by week 4 Date Initiated: 02/12/2016 Goal Status: Active Ulcer/skin breakdown will have a volume reduction of 50% by week 8 Date Initiated: 02/12/2016 Goal Status: Active Ulcer/skin breakdown will have a volume reduction of 80% by week 12 Date Initiated: 02/12/2016 Goal Status: Active Interventions: Assess patient/caregiver ability to obtain necessary supplies Assess ulceration(s) every visit MI[facility], TUNE ([id number]) Notes: Electronic Signature(s) Signed: 03/05/2016 4:35:35 PM By: Alric Quan Entered By: Alric Quan on 03/05/2016 15:43:54 [name] ([id number]) -------------------------------------------------------------------------------- Pain Assessment Details Patient Name: [name]. Date of Service: 03/05/2016 3:45 PM Medical Record Patient Account Number: [id number] Number: Treating RN: Ahmed Prima 21-Feb-1922 (82 y.o. Other Clinician: Date of Birth/Sex: Female) Treating ROBSON, MICHAEL Primary Care Physician: Pernell Dupre Physician/Extender: G Referring Physician: Joylene John in Treatment: 3 Active Problems Location of Pain  Severity and Description of Pain Patient Has Paino No Site Locations Pain Management and Medication Current Pain Management: Electronic Signature(s) Signed: 03/05/2016 4:35:35 PM By: Alric Quan Entered By: Alric Quan on 03/05/2016 15:30:33 [name] ([id number]) -------------------------------------------------------------------------------- Patient/Caregiver Education Details Patient Name: [name]. Date of Service: 03/05/2016 3:45 PM Medical Record Patient Account Number: [id number] Number: Treating RN: Ahmed Prima 15-Dec-1921 (82 y.o. Other Clinician: Date of Birth/Gender: Female) Treating ROBSON, [place] Primary Care Physician: Pernell Dupre Physician/Extender: G Referring Physician: Joylene John in Treatment: 3 Education Assessment Education Provided To: Patient Education Topics Provided Wound/Skin Impairment: Handouts: Other: change dressing as ordered Methods: Demonstration, Explain/Verbal Responses: State content correctly Electronic Signature(s) Signed: 03/05/2016 4:35:35 PM By: Alric Quan Entered By: Alric Quan on 03/05/2016 15:48:30 [name] ([id number]) -------------------------------------------------------------------------------- Wound Assessment Details Patient Name: [name]. Date of Service: 03/05/2016 3:45 PM Medical Record Patient Account Number: [id number] Number: Treating RN: Ahmed Prima January 22, 1922 (82 y.o. Other Clinician: Date of Birth/Sex: Female) Treating ROBSON, MICHAEL Primary Care Physician: Pernell Dupre Physician/Extender: G Referring Physician: Joylene John in Treatment: 3 Wound Status Wound Number: 5 Primary Trauma, Other Etiology: Wound Location: Left Lower Leg - Proximal Wound Open Wounding Event: Trauma Status: Date Acquired: 12/15/2015 Comorbid Cataracts, Angina, Arrhythmia, Weeks Of Treatment: 3 History:  Congestive Heart Failure, Hypertension, Clustered Wound: No  Hepatitis B, Osteoarthritis, Seizure Disorder Photos Photo Uploaded By: Alric Quan on 03/05/2016 16:34:15 Wound Measurements Length: (cm) 0.3 Width: (cm) 0.2 Depth: (cm) 0.1 Area: (cm) 0.047 Volume: (cm) 0.005 % Reduction in Area: 97.5% % Reduction in Volume: 97.3% Epithelialization: None Tunneling: No Undermining: No Wound Description Classification: Partial Thickness Wound Margin: Flat and Intact Exudate Amount: Large Exudate Type: Serosanguineous Exudate Color: red, brown Foul Odor After Cleansing: No Wound Bed Granulation Amount: Large (67-100%) Exposed Structure SUDIKSHA, RIPPY ([id number]) Granulation Quality: Red Fascia Exposed: No Necrotic Amount: Small (1-33%) Fat Layer Exposed: No Necrotic Quality: Eschar, Adherent Slough Tendon Exposed: No Muscle Exposed: No Joint Exposed: No Bone Exposed: No Limited to Skin Breakdown Periwound Skin Texture Texture Color No Abnormalities Noted: No No Abnormalities Noted: No Localized Edema: Yes Erythema: Yes Erythema Location: Circumferential Moisture No Abnormalities Noted: No Temperature / Pain Moist: Yes Temperature: No Abnormality Tenderness on Palpation: Yes Wound Preparation Ulcer Cleansing: Rinsed/Irrigated with Saline Topical Anesthetic Applied: Other: lidocaine 4%, Treatment Notes Wound #5 (Left, Proximal Lower Leg) 1. Cleansed with: Clean wound with Normal Saline 2. Anesthetic Topical Lidocaine 4% cream to wound bed prior to debridement 3. Peri-wound Care: Skin Prep 4. Dressing Applied: Aquacel Ag 5. Secondary Dressing Applied Bordered Foam Dressing Electronic Signature(s) Signed: 03/05/2016 4:35:35 PM By: Alric Quan Entered By: Alric Quan on 03/05/2016 15:36:59 [name] ([id number]) -------------------------------------------------------------------------------- [place] Details Patient Name: [name]. Date of Service: 03/05/2016 3:45 PM Medical Record Patient Account Number: [id number] Number: Treating RN: Ahmed Prima [birth year] (82 y.o. Other Clinician: Date of Birth/Sex: Female) Treating ROBSON, [place] Primary Care Physician: Pernell Dupre Physician/Extender: G Referring Physician: Joylene John in Treatment: 3 Vital Signs Time Taken: 15:30 Reference Range: 80 - 120 mg / dl Height (in): 63 Weight (lbs): 142 Body Mass Index (BMI): 25.2 Notes unable to obtain blood pressure because pt had a blockage in her right arm and she had skin cancer taken off her left arm and had a lot of bruising. Electronic Signature(s) Signed: 03/05/2016 4:35:35 PM By: Alric Quan Entered By: Alric Quan on 03/05/2016 15:35:41

## 2016-03-06 NOTE — Progress Notes (Signed)
[name], [name] ([id number]) Visit Report for 03/05/2016 Chief Complaint Document Details Patient Name: [name], [name]. Date of Service: 03/05/2016 3:45 PM Medical Record Patient Account Number: [id number] Number: Treating RN: [name] 10/14/1922 (82 y.o. Other Clinician: Date of Birth/Sex: Female) Treating [name] Primary Care Physician/Extender: [name] Physician: Referring Physician: Joylene [name] in Treatment: 3 Information Obtained from: Patient Chief Complaint Patient returns to the wound care center for new ulcer to: left lower extremity lateral part of the calf. This has been there for about a month. About 4 days ago she injured her right calf area when she was applying some ointment and she may have scratched herself with a nail. 02/12/16; patient returns today for a two-month history of a nonhealing ulcer on her upper left lateral leg just below the fibular head Electronic Signature(s) Signed: 03/05/2016 5:01:27 PM By: [name] Ham MD Entered By: [name] on 03/05/2016 15:52:00 [name] ([id number]) -------------------------------------------------------------------------------- Debridement Details Patient Name: [name]. Date of Service: 03/05/2016 3:45 PM Medical Record Patient Account Number: [id number] Number: Treating RN: [name] [birth year] (82 y.o. Other Clinician: Date of Birth/Sex: Female) Treating [name] Primary Care Physician/Extender: [name] Physician: Referring Physician: Joylene [name] in Treatment: 3 Debridement Performed for Wound #5 Left,Proximal Lower Leg Assessment: Performed By: Physician [name] Dillon, MD Debridement: Debridement Pre-procedure Yes Verification/Time Out Taken: Start Time: 15:45 Pain Control: Other : lidocaine 4% cream Level: Skin/Subcutaneous Tissue Total Area Debrided (L x 0.3 (cm) x 0.2 (cm)  = 0.06 (cm) W): Tissue and other Viable, Non-Viable, Exudate, Fibrin/Slough, Subcutaneous material debrided: Instrument: Curette Bleeding: Minimum Hemostasis Achieved: Pressure End Time: 15:47 Procedural Pain: 0 Post Procedural Pain: 0 Response to Treatment: Procedure was tolerated well Post Debridement Measurements of Total Wound Length: (cm) 0.3 Width: (cm) 0.2 Depth: (cm) 0.1 Volume: (cm) 0.005 Post Procedure Diagnosis Same as Pre-procedure Electronic Signature(s) Signed: 03/05/2016 4:35:35 PM By: [name] Signed: 03/05/2016 5:01:27 PM By: [name] Ham MD Entered By: [name] on 03/05/2016 15:51:43 [name] ([name], [name] ([id number]) -------------------------------------------------------------------------------- HPI Details Patient Name: [name]. Date of Service: 03/05/2016 3:45 PM Medical Record Patient Account Number: [id number] Number: Treating RN: [name] 12-07-1976 (82 y.o. Other Clinician: Date of Birth/Sex: Female) Treating [name] Primary Care Physician/Extender: [name] Physician: Referring Physician: Joylene [name] in Treatment: 3 History of Present Illness HPI Description: The patient is a pleasant 82 year old with a past medical history significant for aortic valve replacement (on Coumadin), pacemaker, CHF, abdominal aortic aneurysm, and CVA. No history of diabetes or peripheral vascular disease. She underwent excision of a squamous cell cancer on her right upper arm and left dorsal foot in early August 2015 at the [facility]. Shave biopsy on May 11, 2014 showed "superficially invasive squamous cell carcinoma". She subsequently underwent wide excision with clear margins. She presented to the wound clinic with full thickness open surgical wounds. No symptoms to suggest ischemic rest pain or claudication. ABI 1.1. Cultures of L foot  on August 09, 2014 grew ORSA sensitive to tigecycline. She has completed a course of doxycycline. 02/06/2015. -- This time around she had multiple furuncles over her left lower extremity and some on her right too. Was treated by her primary care physician with doxycycline and also was treated for shingles. Most of these ulcerations had healed primarily but the one on her left lower extremity lateral part is open and has continued to  drain for about a month. She has minimal pain over this area. She has been using some local ointment and a dressing has been put over this. Last year she had a full vascular workup at [facility] in [place] and saw Dr. Donnetta [name] for an opinion regarding arterial insufficiency. though she had significant arterial insufficiency a major surgical procedure was not recommended because of her age. Conservative therapy was recommended. The patient has had some vascular vein surgery done about 6 years ago in this hospital. 03/26/2015 -- she now has several other issues going with a cardiac problem and has been quite worried about this. Her leg is doing fine otherwise. 04/02/2015 -- all her chest and abdominal pain turned out to be just chronic constipation and a cardiac workup was fine. She is much relieved about the news. 02/12/16; this is a patient that we have had previously in this clinic. She has severe bilateral venous hypertension with hemosiderin deposition. She also has a history of squamous cell CA with a skin having a cancer excised from her left dorsal foot previously. She is followed by Dr. Redmond [name] at the [facility] in [place]. She also appears to of had a history of lower extremity wounds probably related to venous insufficiency. I note that she has a history of arterial insufficiency by looking at the notes above. Her ABIs were noncompressible in this clinic. Her last arterial Doppler in [facility] from 2014 suggested monophasic waves  bilaterally. She had a right ABI of 0.68 and a left of 0.78. She had diffuse plaque throughout the bilateral femoral-popliteal large artery system no flow was adequately detected within the [place]. ([id number]) right proximal popliteal suggestive of total occlusion. I don't think she was ever attempted to be revascularized in any case the history is that she has had nonhealing wounds on the left leg for 2 months. These apparently almost healed over using honey that they had at home. Ever she bumped the leg against the bed and they reopened. She also has excoriated lesions over her predominant right lateral thigh she scratches these continuously. She sews me small raised areas on her legs which she states resulted in the wounds that she has on the left leg 02/19/16; the patient arrives with the condition of her wound largely unchanged. She saw her dermatologist last week who also looked at this wound. Apparently she went and had an biopsied an area on her left upper arm the thought that the biopsy result of the arm may give [place] insight into the pathogenesis of the wound just below her fibular head were at least that's what I understand. She also has significant surrounding venous insufficiency. Multiple skin cancers. Known PAD 03/05/16: wound is smaller. Electronic Signature(s) Signed: 03/05/2016 5:01:27 PM By: [name] Ham MD Entered By: [name] on 03/05/2016 15:52:54 [name] ([id number]) -------------------------------------------------------------------------------- Physical Exam Details Patient Name: [name], [name]. Date of Service: 03/05/2016 3:45 PM Medical Record Patient Account Number: [id number] Number: Treating RN: [name] 1921/12/26 (82 y.o. Other Clinician: Date of Birth/Sex: Female) Treating Terri Rorrer Primary Care Physician/Extender: [name] Physician: Referring Physician: Joylene [name] in  Treatment: 3 Notes Wound exam: The wound measures smaller. Just below left fibular head. no depth. Debridement done of sough and surrounding escar and skin. Electronic Signature(s) Signed: 03/05/2016 5:01:27 PM By: [name] Ham MD Entered By: [name] on 03/05/2016 15:54:46 [name] ([id number]) -------------------------------------------------------------------------------- Physician Orders Details Patient Name: [name], [name]. Date of Service:  03/05/2016 3:45 PM Medical Record Patient Account Number: [id number] Number: Treating RN: [name] 01/02/1922 (82 y.o. Other Clinician: Date of Birth/Sex: Female) Treating Yecheskel Kurek Primary Care Physician/Extender: [name] Physician: Referring Physician: Joylene [name] in Treatment: 3 Verbal / Phone Orders: Yes Clinician: Pinkerton, Debi Read Back and Verified: Yes Diagnosis Coding Wound Cleansing Wound #5 Left,Proximal Lower Leg o Clean wound with Normal Saline. o Cleanse wound with mild soap and water Anesthetic Wound #5 Left,Proximal Lower Leg o Topical Lidocaine 4% cream applied to wound bed prior to debridement Skin Barriers/Peri-Wound Care Wound #5 Left,Proximal Lower Leg o Skin Prep Primary Wound Dressing Wound #5 Left,Proximal Lower Leg o Aquacel Ag Secondary Dressing Wound #5 Left,Proximal Lower Leg o Boardered Foam Dressing Dressing Change Frequency Wound #5 Left,Proximal Lower Leg o Change dressing every other day. Follow-up Appointments Wound #5 Left,Proximal Lower Leg o Return Appointment in 1 week. Edema Control Wound #5 Left,Proximal Lower Leg o Elevate legs to the level of the heart and pump ankles as often as possible [name], [name] F. ([id number]) Additional Orders / Instructions Wound #5 Left,Proximal Lower Leg o Increase protein intake. Medications-please add to medication list. Wound #5 Left,Proximal Lower Leg o  Other: - Vitamin C, Zinc Multivitamin Electronic Signature(s) Signed: 03/05/2016 4:35:35 PM By: [name] Signed: 03/05/2016 5:01:27 PM By: [name] Ham MD Entered By: [name] on 03/05/2016 15:47:24 [name] ([id number]) -------------------------------------------------------------------------------- Problem List Details Patient Name: [name], [name]. Date of Service: 03/05/2016 3:45 PM Medical Record Patient Account Number: [id number] Number: Treating RN: [name] 1922-05-02 (82 y.o. Other Clinician: Date of Birth/Sex: Female) Treating Mehgan Santmyer Primary Care Physician/Extender: [name] Physician: Referring Physician: Joylene [name] in Treatment: 3 Active Problems ICD-10 Encounter Code Description Active Date Diagnosis I87.333 Chronic venous hypertension (idiopathic) with ulcer and 02/12/2016 Yes inflammation of bilateral lower extremity C44.82 Squamous cell carcinoma of overlapping sites of skin 02/12/2016 Yes I48.2 Chronic atrial fibrillation 02/12/2016 Yes Inactive Problems Resolved Problems Electronic Signature(s) Signed: 03/05/2016 5:01:27 PM By: [name] Ham MD Entered By: [name] on 03/05/2016 15:51:30 [name] ([id number]) -------------------------------------------------------------------------------- Progress Note Details Patient Name: [name]. Date of Service: 03/05/2016 3:45 PM Medical Record Patient Account Number: [id number] Number: Treating RN: [name] 01-13-1922 (82 y.o. Other Clinician: Date of Birth/Sex: Female) Treating Zeniya Lapidus Primary Care Physician/Extender: [name] Physician: Referring Physician: Joylene [name] in Treatment: 3 Subjective Chief Complaint Information obtained from Patient Patient returns to the wound care center for new ulcer to: left lower extremity lateral part of the calf. This has  been there for about a month. About 4 days ago she injured her right calf area when she was applying some ointment and she may have scratched herself with a nail. 02/12/16; patient returns today for a two- month history of a nonhealing ulcer on her upper left lateral leg just below the fibular head History of Present Illness (HPI) The patient is a pleasant 82 year old with a past medical history significant for aortic valve replacement (on Coumadin), pacemaker, CHF, abdominal aortic aneurysm, and CVA. No history of diabetes or peripheral vascular disease. She underwent excision of a squamous cell cancer on her right upper arm and left dorsal foot in early August 2015 at the [facility]. Shave biopsy on May 11, 2014 showed "superficially invasive squamous cell carcinoma". She subsequently underwent wide excision with clear margins. She presented to the wound clinic with full thickness open surgical wounds. No symptoms  to suggest ischemic rest pain or claudication. ABI 1.1. Cultures of L foot on August 09, 2014 grew ORSA sensitive to tigecycline. She has completed a course of doxycycline. 02/06/2015. -- This time around she had multiple furuncles over her left lower extremity and some on her right too. Was treated by her primary care physician with doxycycline and also was treated for shingles. Most of these ulcerations had healed primarily but the one on her left lower extremity lateral part is open and has continued to drain for about a month. She has minimal pain over this area. She has been using some local ointment and a dressing has been put over this. Last year she had a full vascular workup at [facility] in [place] and saw Dr. Donnetta [name] for an opinion regarding arterial insufficiency. though she had significant arterial insufficiency a major surgical procedure was not recommended because of her age. Conservative therapy was recommended. The patient has had some  vascular vein surgery done about 6 years ago in this hospital. 03/26/2015 -- she now has several other issues going with a cardiac problem and has been quite worried about this. Her leg is doing fine otherwise. 04/02/2015 -- all her chest and abdominal pain turned out to be just chronic constipation and a cardiac workup was fine. She is much relieved about the news. [name], [name] ([id number]) 02/12/16; this is a patient that we have had previously in this clinic. She has severe bilateral venous hypertension with hemosiderin deposition. She also has a history of squamous cell CA with a skin having a cancer excised from her left dorsal foot previously. She is followed by Dr. Redmond [name] at the [facility] in [place]. She also appears to of had a history of lower extremity wounds probably related to venous insufficiency. I note that she has a history of arterial insufficiency by looking at the notes above. Her ABIs were noncompressible in this clinic. Her last arterial Doppler in [facility] from 2014 suggested monophasic waves bilaterally. She had a right ABI of 0.68 and a left of 0.78. She had diffuse plaque throughout the bilateral femoral-popliteal large artery system no flow was adequately detected within the right proximal popliteal suggestive of total occlusion. I don't think she was ever attempted to be revascularized in any case the history is that she has had nonhealing wounds on the left leg for 2 months. These apparently almost healed over using honey that they had at home. Ever she bumped the leg against the bed and they reopened. She also has excoriated lesions over her predominant right lateral thigh she scratches these continuously. She sews me small raised areas on her legs which she states resulted in the wounds that she has on the left leg 02/19/16; the patient arrives with the condition of her wound largely unchanged. She saw her dermatologist last week who also  looked at this wound. Apparently she went and had an biopsied an area on her left upper arm the thought that the biopsy result of the arm may give [place] insight into the pathogenesis of the wound just below her fibular head were at least that's what I understand. She also has significant surrounding venous insufficiency. Multiple skin cancers. Known PAD 03/05/16: wound is smaller. Objective Constitutional Vitals Time Taken: 3:30 PM, Height: 63 in, Weight: 142 lbs, BMI: 25.2. General Notes: unable to obtain blood pressure because pt had a blockage in her right arm and she had skin cancer taken off her left arm and had a  lot of bruising. Integumentary (Hair, Skin) Wound #5 status is Open. Original cause of wound was Trauma. The wound is located on the Left,Proximal Lower Leg. The wound measures 0.3cm length x 0.2cm width x 0.1cm depth; 0.047cm^2 area and 0.005cm^3 volume. The wound is limited to skin breakdown. There is no tunneling or undermining noted. There is a large amount of serosanguineous drainage noted. The wound margin is flat and intact. There is large (67-100%) red granulation within the wound bed. There is a small (1-33%) amount of necrotic tissue within the wound bed including Eschar and Adherent Slough. The periwound skin appearance exhibited: Localized Edema, Moist, Erythema. The surrounding wound skin color is noted with erythema which is circumferential. Periwound temperature was noted as No Abnormality. The periwound has tenderness on palpation. [name], [name] ([id number]) Assessment Active Problems ICD-10 [phone] - Chronic venous hypertension (idiopathic) with ulcer and inflammation of bilateral lower extremity C44.82 - Squamous cell carcinoma of overlapping sites of skin I48.2 - Chronic atrial fibrillation Procedures Wound #5 Wound #5 is a Trauma, Other located on the Left,Proximal Lower Leg . There was a Skin/Subcutaneous Tissue Debridement [id number]) debridement  with total area of 0.06 sq cm performed by [name] Dillon, MD. with the following instrument(s): Curette to remove Viable and Non-Viable tissue/material including Exudate, Fibrin/Slough, and Subcutaneous after achieving pain control using Other (lidocaine 4% cream). A time out was conducted prior to the start of the procedure. A Minimum amount of bleeding was controlled with Pressure. The procedure was tolerated well with a pain level of 0 throughout and a pain level of 0 following the procedure. Post Debridement Measurements: 0.3cm length x 0.2cm width x 0.1cm depth; 0.005cm^3 volume. Post procedure Diagnosis Wound #5: Same as Pre-Procedure Plan Wound Cleansing: Wound #5 Left,Proximal Lower Leg: Clean wound with Normal Saline. Cleanse wound with mild soap and water Anesthetic: Wound #5 Left,Proximal Lower Leg: Topical Lidocaine 4% cream applied to wound bed prior to debridement Skin Barriers/Peri-Wound Care: Wound #5 Left,Proximal Lower Leg: Skin Prep Primary Wound Dressing: Wound #5 Left,Proximal Lower Leg: Aquacel Ag [name] ([id number]) Secondary Dressing: Wound #5 Left,Proximal Lower Leg: Boardered Foam Dressing Dressing Change Frequency: Wound #5 Left,Proximal Lower Leg: Change dressing every other day. Follow-up Appointments: Wound #5 Left,Proximal Lower Leg: Return Appointment in 1 week. Edema Control: Wound #5 Left,Proximal Lower Leg: Elevate legs to the level of the heart and pump ankles as often as possible Additional Orders / Instructions: Wound #5 Left,Proximal Lower Leg: Increase protein intake. Medications-please add to medication list.: Wound #5 Left,Proximal Lower Leg: Other: - Vitamin C, Zinc Multivitamin no change to Aquacel AG, foam dressing as this appears to be healing, I see no reason tos suspect this malignant she showed me a rasied "mushroom" quarter sized lesion on her left forearm . I asked her to show derm on next visit Electronic  Signature(s) Signed: 03/05/2016 5:01:27 PM By: [name] Ham MD Entered By: [name] on 03/05/2016 16:01:22 [name] ([id number]) -------------------------------------------------------------------------------- [place] Details Patient Name: [name]. Date of Service: 03/05/2016 Medical Record Patient Account Number: [id number] Number: Treating RN: [name] March 11, 1922 (82 y.o. Other Clinician: Date of Birth/Sex: Female) Treating Taysia Rivere Primary Care Physician/Extender: [name] Physician: Weeks in Treatment: 3 Referring Physician: Pernell Dupre Diagnosis Coding ICD-10 Codes Code Description Chronic venous hypertension (idiopathic) with ulcer and inflammation of bilateral lower I87.333 extremity C44.82 Squamous cell carcinoma of overlapping sites of skin I48.2 Chronic atrial fibrillation Facility Procedures CPT4: Description Modifier Quantity Code [id number]  [id number] - DEB SUBQ TISSUE 20 SQ CM/< 1 ICD-10 Description Diagnosis I87.333 Chronic venous hypertension (idiopathic) with ulcer and inflammation of bilateral lower extremity Physician Procedures CPT4: Description Modifier Quantity Code [id number] - WC PHYS SUBQ TISS 20 SQ CM 1 ICD-10 Description Diagnosis I87.333 Chronic venous hypertension (idiopathic) with ulcer and inflammation of bilateral lower extremity Electronic Signature(s) Signed: 03/05/2016 5:01:27 PM By: [name] Ham MD Entered By: [name] on 03/05/2016 16:01:32

## 2016-03-11 ENCOUNTER — Encounter: Payer: Medicare Other | Admitting: Internal Medicine

## 2016-03-11 DIAGNOSIS — I87333 Chronic venous hypertension (idiopathic) with ulcer and inflammation of bilateral lower extremity: Secondary | ICD-10-CM | POA: Diagnosis not present

## 2016-03-13 NOTE — Progress Notes (Signed)
[name], [name] ([id number]) Visit Report for 03/11/2016 Arrival Information Details Patient Name: [name], [name]. Date of Service: 03/11/2016 12:45 PM Medical Record Patient Account Number: [id number] Number: Treating RN: Ahmed Prima 26-Oct-1922 (82 y.o. Other Clinician: Date of Birth/Sex: Female) Treating ROBSON, [place] Primary Care Physician: Pernell Dupre Physician/Extender: G Referring Physician: Joylene John in Treatment: 4 Visit Information History Since Last Visit All ordered tests and consults were completed: No Patient Arrived: Wheel Chair Added or deleted any medications: No Arrival Time: 13:06 Any new allergies or adverse reactions: No Accompanied By: niece Had a fall or experienced change in No Transfer Assistance: EasyPivot Patient activities of daily living that may affect Lift risk of falls: Patient Identification Verified: Yes Signs or symptoms of abuse/neglect since last No Secondary Verification Process Yes visito Completed: Hospitalized since last visit: No Patient Requires Transmission- No Pain Present Now: No Based Precautions: Patient Has Alerts: Yes Patient Alerts: Patient [name] Blood Thinner Coumadin Electronic Signature(s) Signed: 03/11/2016 5:04:44 PM By: Alric Quan Entered By: Alric Quan [name] 03/11/2016 13:07:10 [name] ([id number]) -------------------------------------------------------------------------------- Clinic Level of Care Assessment Details Patient Name: [name]. Date of Service: 03/11/2016 12:45 PM Medical Record Patient Account Number: [id number] Number: Treating RN: Ahmed Prima 01/19/1922 (82 y.o. Other Clinician: Date of Birth/Sex: Female) Treating ROBSON, [place] Primary Care Physician: Pernell Dupre Physician/Extender: G Referring Physician: Joylene John in Treatment: 4 Clinic Level of Care Assessment Items TOOL 4 Quantity Score X  - Use when only an EandM is performed [name] FOLLOW-UP visit 1 0 ASSESSMENTS - Nursing Assessment / Reassessment X - Reassessment of Co-morbidities (includes updates in patient status) 1 10 X - Reassessment of Adherence to Treatment Plan 1 5 ASSESSMENTS - Wound and Skin Assessment / Reassessment X - Simple Wound Assessment / Reassessment - one wound 1 5 []  - Complex Wound Assessment / Reassessment - multiple wounds 0 []  - Dermatologic / Skin Assessment (not related to wound area) 0 ASSESSMENTS - Focused Assessment []  - Circumferential Edema Measurements - multi extremities 0 []  - Nutritional Assessment / Counseling / Intervention 0 []  - Lower Extremity Assessment (monofilament, tuning fork, pulses) 0 []  - Peripheral Arterial Disease Assessment (using hand held doppler) 0 ASSESSMENTS - Ostomy and/or Continence Assessment and Care []  - Incontinence Assessment and Management 0 []  - Ostomy Care Assessment and Management (repouching, etc.) 0 PROCESS - Coordination of Care X - Simple Patient / Family Education for ongoing care 1 15 []  - Complex (extensive) Patient / Family Education for ongoing care 0 []  - Staff obtains Programmer, systems, Records, Test Results / Process Orders 0 []  - Staff telephones HHA, Nursing Homes / Clarify orders / etc 0 [name], [name] F. ([id number]) []  - Routine Transfer to another Facility (non-emergent condition) 0 []  - Routine Hospital Admission (non-emergent condition) 0 []  - New Admissions / Biomedical engineer / Ordering NPWT, Apligraf, etc. 0 []  - Emergency Hospital Admission (emergent condition) 0 X - Simple Discharge Coordination 1 10 []  - Complex (extensive) Discharge Coordination 0 PROCESS - Special Needs []  - Pediatric / Minor Patient Management 0 []  - Isolation Patient Management 0 []  - Hearing / Language / Visual special needs 0 []  - Assessment of Community assistance (transportation, D/C planning, etc.) 0 []  - Additional assistance / Altered mentation 0 []   - Support Surface(s) Assessment (bed, cushion, seat, etc.) 0 INTERVENTIONS - Wound Cleansing / Measurement X - Simple Wound Cleansing - one wound 1 5 []  - Complex Wound Cleansing - multiple wounds 0  X - Wound Imaging (photographs - any number of wounds) 1 5 []  - Wound Tracing (instead of photographs) 0 X - Simple Wound Measurement - one wound 1 5 []  - Complex Wound Measurement - multiple wounds 0 INTERVENTIONS - Wound Dressings X - Small Wound Dressing one or multiple wounds 1 10 []  - Medium Wound Dressing one or multiple wounds 0 []  - Large Wound Dressing one or multiple wounds 0 X - Application of Medications - topical 1 5 []  - Application of Medications - injection 0 [name], [name]. ([id number]) INTERVENTIONS - Miscellaneous []  - External ear exam 0 []  - Specimen Collection (cultures, biopsies, blood, body fluids, etc.) 0 []  - Specimen(s) / Culture(s) sent or taken to Lab for analysis 0 []  - Patient Transfer (multiple staff / Harrel Lemon Lift / Similar devices) 0 []  - Simple Staple / Suture removal (25 or less) 0 []  - Complex Staple / Suture removal (26 or more) 0 []  - Hypo / Hyperglycemic Management (close monitor of Blood Glucose) 0 []  - Ankle / Brachial Index (ABI) - do not check if billed separately 0 X - Vital Signs 1 5 Has the patient been seen at the hospital within the last three years: Yes Total Score: 80 Level Of Care: New/Established - Level 3 Electronic Signature(s) Signed: 03/11/2016 5:04:44 PM By: Alric Quan Entered By: Alric Quan [name] 03/11/2016 14:15:47 [name] ([id number]) -------------------------------------------------------------------------------- Encounter Discharge Information Details Patient Name: [name]. Date of Service: 03/11/2016 12:45 PM Medical Record Patient Account Number: [id number] Number: Treating RN: Ahmed Prima February 20, 1922 (82 y.o. Other Clinician: Date of Birth/Sex: Female) Treating ROBSON,  [place] Primary Care Physician: Pernell Dupre Physician/Extender: G Referring Physician: Joylene John in Treatment: 4 Encounter Discharge Information Items Discharge Pain Level: 0 Discharge Condition: Stable Ambulatory Status: Wheelchair Discharge Destination: Home Transportation: Private Auto Accompanied By: niece Schedule Follow-up Appointment: Yes Medication Reconciliation completed and provided to Patient/Care Yes Carrell [place]mat[place]: Provided [name] Clinical Summary of Care: 03/11/2016 Form Type Recipient Paper Patient JW Electronic Signature(s) Signed: 03/11/2016 2:12:47 PM By: Ruthine Dose Entered By: Ruthine Dose [name] 03/11/2016 14:12:47 [name] ([id number]) -------------------------------------------------------------------------------- Lower Extremity Assessment Details Patient Name: [name]. Date of Service: 03/11/2016 12:45 PM Medical Record Patient Account Number: [id number] Number: Treating RN: Ahmed Prima Apr 24, 1922 (82 y.o. Other Clinician: Date of Birth/Sex: Female) Treating ROBSON, [place] Primary Care Physician: Pernell Dupre Physician/Extender: G Referring Physician: Joylene John in Treatment: 4 Vascular Assessment Pulses: Posterior Tibial Dorsalis Pedis Palpable: [Left:No] Doppler: [Left:Monophasic] Extremity colors, hair growth, and conditions: Temperature of Extremity: [Left:Warm] Capillary Refill: [Left:< 3 seconds] Toe Nail Assessment Left: Right: Thick: No Discolored: No Deformed: No Improper Length and Hygiene: No Electronic Signature(s) Signed: 03/11/2016 5:04:44 PM By: Alric Quan Entered By: Alric Quan [name] 03/11/2016 13:18:37 [name] ([id number]) -------------------------------------------------------------------------------- Multi Wound Chart Details Patient Name: [name]. Date of Service: 03/11/2016 12:45 PM Medical Record Patient Account Number:  [id number] Number: Treating RN: Ahmed Prima October 12, 1922 (82 y.o. Other Clinician: Date of Birth/Sex: Female) Treating ROBSON, [place] Primary Care Physician: Pernell Dupre Physician/Extender: G Referring Physician: Joylene John in Treatment: 4 Vital Signs Height(in): 63 Pulse(bpm): 72 Weight(lbs): 142 Blood Pressure (mmHg): Body Mass Index(BMI): 25 Temperature(F): Respiratory Rate (breaths/min): Photos: [5:No Photos] [N/A:N/A] Wound Location: [5:Left Lower Leg - Proximal N/A] Wounding Event: [5:Trauma] [N/A:N/A] Primary Etiology: [5:Trauma, Other] [N/A:N/A] Comorbid History: [5:Cataracts, Angina, Arrhythmia, Congestive Heart Failure, Hypertension, Hepatitis B, Osteoarthritis, Seizure Disorder] [N/A:N/A] Date Acquired: [5:12/15/2015] [N/A:N/A] Weeks of Treatment: [  5:4] [N/A:N/A] Wound Status: [5:Open] [N/A:N/A] Measurements L x W x D 0.3x0.2x0.1 [N/A:N/A] (cm) Area (cm) : [5:0.047] [N/A:N/A] Volume (cm) : [5:0.005] [N/A:N/A] % Reduction in Area: [5:97.50%] [N/A:N/A] % Reduction in Volume: 97.30% [N/A:N/A] Classification: [5:Partial Thickness] [N/A:N/A] Exudate Amount: [5:Large] [N/A:N/A] Exudate Type: [5:Serosanguineous] [N/A:N/A] Exudate Color: [5:red, brown] [N/A:N/A] Wound Margin: [5:Flat and Intact] [N/A:N/A] Granulation Amount: [5:Small (1-33%)] [N/A:N/A] Granulation Quality: [5:Red] [N/A:N/A] Necrotic Amount: [5:Large (67-100%)] [N/A:N/A] Necrotic Tissue: [5:Eschar, Adherent Slough N/A] Exposed Structures: Fascia: No N/A N/A Fat: No Tendon: No Muscle: No Joint: No Bone: No Limited to Skin Breakdown Epithelialization: None N/A N/A Periwound Skin Texture: Edema: Yes N/A N/A Periwound Skin Moist: Yes N/A N/A Moisture: Periwound Skin Color: Erythema: Yes N/A N/A Erythema Location: Circumferential N/A N/A Temperature: No Abnormality N/A N/A Tenderness [name] Yes N/A N/A Palpation: Wound Preparation: Ulcer Cleansing: N/A  N/A Rinsed/Irrigated with Saline Topical Anesthetic Applied: Other: lidocaine 4% Treatment Notes Electronic Signature(s) Signed: 03/11/2016 5:04:44 PM By: Alric Quan Entered By: Alric Quan [name] 03/11/2016 13:23:25 [name] ([id number]) -------------------------------------------------------------------------------- [place] Plan Details Patient Name: [name]. Date of Service: 03/11/2016 12:45 PM Medical Record Patient Account Number: [id number] Number: Treating RN: Ahmed Prima 02/19/1922 (82 y.o. Other Clinician: Date of Birth/Sex: Female) Treating ROBSON, [place] Primary Care Physician: Pernell Dupre Physician/Extender: G Referring Physician: Joylene John in Treatment: 4 Active Inactive Abuse / Safety / Falls / Self Care Management Nursing Diagnoses: Potential for falls Goals: Patient will remain injury free Date Initiated: 02/12/2016 Goal Status: Active Interventions: Assess fall risk [name] admission and as needed Notes: Nutrition Nursing Diagnoses: Imbalanced nutrition Goals: Patient/caregiver agrees to and verbalizes understanding of need to use nutritional supplements and/or vitamins as prescribed Date Initiated: 02/12/2016 Goal Status: Active Interventions: Assess patient nutrition upon admission and as needed per policy Notes: Orientation to the Wound Care Program Nursing Diagnoses: Knowledge deficit related to the wound healing center program [name], [name] ([id number]) Goals: Patient/caregiver will verbalize understanding of the [facility] Program Date Initiated: 02/12/2016 Goal Status: Active Interventions: Provide education [name] orientation to the wound center Notes: Soft Tissue Infection Nursing Diagnoses: Impaired tissue integrity Goals: Patient/caregiver will verbalize understanding of or measures to prevent infection and contamination in the home setting Date  Initiated: 02/12/2016 Goal Status: Active Interventions: Assess signs and symptoms of infection every visit Notes: Wound/Skin Impairment Nursing Diagnoses: Impaired tissue integrity Goals: Ulcer/skin breakdown will have a volume reduction of 30% by week 4 Date Initiated: 02/12/2016 Goal Status: Active Ulcer/skin breakdown will have a volume reduction of 50% by week 8 Date Initiated: 02/12/2016 Goal Status: Active Ulcer/skin breakdown will have a volume reduction of 80% by week 12 Date Initiated: 02/12/2016 Goal Status: Active Interventions: Assess patient/caregiver ability to obtain necessary supplies Assess ulceration(s) every visit [name], [name] ([id number]) Notes: Electronic Signature(s) Signed: 03/11/2016 5:04:44 PM By: Alric Quan Entered By: Alric Quan [name] 03/11/2016 13:23:16 [name] ([id number]) -------------------------------------------------------------------------------- Pain Assessment Details Patient Name: [name]. Date of Service: 03/11/2016 12:45 PM Medical Record Patient Account Number: [id number] Number: Treating RN: Ahmed Prima May 27, 1922 (82 y.o. Other Clinician: Date of Birth/Sex: Female) Treating ROBSON, MICHAEL Primary Care Physician: Pernell Dupre Physician/Extender: G Referring Physician: Joylene John in Treatment: 4 Active Problems Location of Pain Severity and Description of Pain Patient Has Paino No Site Locations Pain Management and Medication Current Pain Management: Electronic Signature(s) Signed: 03/11/2016 5:04:44 PM By: Alric Quan Entered By: Alric Quan [name] 03/11/2016 13:07:18 [name] ([id number]) -------------------------------------------------------------------------------- Patient/Caregiver  Education Details Patient Name: [name], [name]. Date of Service: 03/11/2016 12:45 PM Medical Record Patient Account Number:  [id number] Number: Treating RN: Ahmed Prima 05-Mar-1922 (82 y.o. Other Clinician: Date of Birth/Gender: Female) Treating ROBSON, [place] Primary Care Physician: Pernell Dupre Physician/Extender: G Referring Physician: Joylene John in Treatment: 4 Education Assessment Education Provided To: Patient Education Topics Provided Wound/Skin Impairment: Handouts: Other: change dressing as ordered Methods: Demonstration, Explain/Verbal Responses: State content correctly Electronic Signature(s) Signed: 03/11/2016 5:04:44 PM By: Alric Quan Entered By: Alric Quan [name] 03/11/2016 14:00:06 [name] ([id number]) -------------------------------------------------------------------------------- Wound Assessment Details Patient Name: [name]. Date of Service: 03/11/2016 12:45 PM Medical Record Patient Account Number: [id number] Number: Treating RN: Ahmed Prima 1922/05/18 (82 y.o. Other Clinician: Date of Birth/Sex: Female) Treating ROBSON, [place] Primary Care Physician: Pernell Dupre Physician/Extender: G Referring Physician: Joylene John in Treatment: 4 Wound Status Wound Number: 5 Primary Trauma, Other Etiology: Wound Location: Left Lower Leg - Proximal Wound Open Wounding Event: Trauma Status: Date Acquired: 12/15/2015 Comorbid Cataracts, Angina, Arrhythmia, Weeks Of Treatment: 4 History: Congestive Heart Failure, Hypertension, Clustered Wound: No Hepatitis B, Osteoarthritis, Seizure Disorder Photos Photo Uploaded By: Alric Quan [name] 03/11/2016 16:30:40 Wound Measurements Length: (cm) 0.3 Width: (cm) 0.2 Depth: (cm) 0.1 Area: (cm) 0.047 Volume: (cm) 0.005 % Reduction in Area: 97.5% % Reduction in Volume: 97.3% Epithelialization: None Tunneling: No Undermining: No Wound Description Classification: Partial Thickness Wound Margin: Flat and Intact Exudate Amount: Large Exudate  Type: Serosanguineous Exudate Color: red, brown Foul Odor After Cleansing: No Wound Bed Granulation Amount: Small (1-33%) Exposed Structure [name], [name] ([id number]) Granulation Quality: Red Fascia Exposed: No Necrotic Amount: Large (67-100%) Fat Layer Exposed: No Necrotic Quality: Eschar, Adherent Slough Tendon Exposed: No Muscle Exposed: No Joint Exposed: No Bone Exposed: No Limited to Skin Breakdown Periwound Skin Texture Texture Color No Abnormalities Noted: No No Abnormalities Noted: No Localized Edema: Yes Erythema: Yes Erythema Location: Circumferential Moisture No Abnormalities Noted: No Temperature / Pain Moist: Yes Temperature: No Abnormality Tenderness [name] Palpation: Yes Wound Preparation Ulcer Cleansing: Rinsed/Irrigated with Saline Topical Anesthetic Applied: Other: lidocaine 4%, Treatment Notes Wound #5 (Left, Proximal Lower Leg) 1. Cleansed with: Clean wound with Normal Saline 2. Anesthetic Topical Lidocaine 4% cream to wound bed prior to debridement 3. Peri-wound Care: Skin Prep 4. Dressing Applied: Aquacel Ag 5. Secondary Dressing Applied Bordered Foam Dressing Electronic Signature(s) Signed: 03/11/2016 5:04:44 PM By: Alric Quan Entered By: Alric Quan [name] 03/11/2016 13:23:05 [name] ([id number]) -------------------------------------------------------------------------------- [place] Details Patient Name: [name]. Date of Service: 03/11/2016 12:45 PM Medical Record Patient Account Number: [id number] Number: Treating RN: Ahmed Prima 1922/05/04 (82 y.o. Other Clinician: Date of Birth/Sex: Female) Treating ROBSON, [place] Primary Care Physician: Pernell Dupre Physician/Extender: G Referring Physician: Joylene John in Treatment: 4 Vital Signs Time Taken: 13:12 Pulse (bpm): 72 Height (in): 63 Reference Range: 80 - 120 mg / dl Weight (lbs): 142 Pulse Oximetry (%): 98 Body Mass  Index (BMI): 25.2 Notes unable to obtain blood pressure because pt had a blockage in her right arm and she had skin cancer taken off her left arm and had a lot of bruising. Electronic Signature(s) Signed: 03/11/2016 5:04:44 PM By: Alric Quan Entered By: Alric Quan [name] 03/11/2016 13:16:29

## 2016-03-13 NOTE — Progress Notes (Signed)
DEJHA, BORCHERT ([id number]) Visit Report for 03/11/2016 Chief Complaint Document Details Patient Name: [name], [name]. Date of Service: 03/11/2016 12:45 PM Medical Record Patient Account Number: [id number] Number: Treating RN: Ahmed Prima Sep 08, 1922 (82 y.o. Other Clinician: Date of Birth/Sex: Female) Treating ROBSON, MICHAEL Primary Care Physician/Extender: Pearson Forster Physician: Referring Physician: Joylene John in Treatment: 4 Information Obtained from: Patient Chief Complaint Patient returns to the wound care center for new ulcer to: left lower extremity lateral part of the calf. This has been there for about a month. About 4 days ago she injured her right calf area when she was applying some ointment and she may have scratched herself with a nail. 02/12/16; patient returns today for a two-month history of a nonhealing ulcer on her upper left lateral leg just below the fibular head Electronic Signature(s) Signed: 03/13/2016 7:59:30 AM By: Linton Ham MD Entered By: Linton Ham on 03/11/2016 14:03:03 [name] ([id number]) -------------------------------------------------------------------------------- HPI Details Patient Name: [name]. Date of Service: 03/11/2016 12:45 PM Medical Record Patient Account Number: [id number] Number: Treating RN: Ahmed Prima 1922-07-29 (82 y.o. Other Clinician: Date of Birth/Sex: Female) Treating ROBSON, MICHAEL Primary Care Physician/Extender: Pearson Forster Physician: Referring Physician: Joylene John in Treatment: 4 History of Present Illness HPI Description: The patient is a pleasant 82 year old with a past medical history significant for aortic valve replacement (on Coumadin), pacemaker, CHF, abdominal aortic aneurysm, and CVA. No history of diabetes or peripheral vascular disease. She underwent excision of a squamous cell cancer on her  right upper arm and left dorsal foot in early August 2015 at the [facility]. Shave biopsy on May 11, 2014 showed "superficially invasive squamous cell carcinoma". She subsequently underwent wide excision with clear margins. She presented to the wound clinic with full thickness open surgical wounds. No symptoms to suggest ischemic rest pain or claudication. ABI 1.1. Cultures of L foot on August 09, 2014 grew ORSA sensitive to tigecycline. She has completed a course of doxycycline. 02/06/2015. -- This time around she had multiple furuncles over her left lower extremity and some on her right too. Was treated by her primary care physician with doxycycline and also was treated for shingles. Most of these ulcerations had healed primarily but the one on her left lower extremity lateral part is open and has continued to drain for about a month. She has minimal pain over this area. She has been using some local ointment and a dressing has been put over this. Last year she had a full vascular workup at [facility] in [place] and saw Dr. Donnetta Hutching for an opinion regarding arterial insufficiency. though she had significant arterial insufficiency a major surgical procedure was not recommended because of her age. Conservative therapy was recommended. The patient has had some vascular vein surgery done about 6 years ago in this hospital. 03/26/2015 -- she now has several other issues going with a cardiac problem and has been quite worried about this. Her leg is doing fine otherwise. 04/02/2015 -- all her chest and abdominal pain turned out to be just chronic constipation and a cardiac workup was fine. She is much relieved about the news. 02/12/16; this is a patient that we have had previously in this clinic. She has severe bilateral venous hypertension with hemosiderin deposition. She also has a history of squamous cell CA with a skin having a cancer excised from her left dorsal foot  previously. She is followed by Dr. Redmond Pulling at the  skin surgery Center in [place]. She also appears to of had a history of lower extremity wounds probably related to venous insufficiency. I note that she has a history of arterial insufficiency by looking at the notes above. Her ABIs were noncompressible in this clinic. Her last arterial Doppler in [facility] from 2014 suggested monophasic waves bilaterally. She had a right ABI of 0.68 and a left of 0.78. She had diffuse plaque throughout the bilateral femoral-popliteal large artery system no flow was adequately detected within the [place]. ([id number]) right proximal popliteal suggestive of total occlusion. I don't think she was ever attempted to be revascularized in any case the history is that she has had nonhealing wounds on the left leg for 2 months. These apparently almost healed over using honey that they had at home. Ever she bumped the leg against the bed and they reopened. She also has excoriated lesions over her predominant right lateral thigh she scratches these continuously. She sews me small raised areas on her legs which she states resulted in the wounds that she has on the left leg 02/19/16; the patient arrives with the condition of her wound largely unchanged. She saw her dermatologist last week who also looked at this wound. Apparently she went and had an biopsied an area on her left upper arm the thought that the biopsy result of the arm may give [place] insight into the pathogenesis of the wound just below her fibular head were at least that's what I understand. She also has significant surrounding venous insufficiency. Multiple skin cancers. Known PAD 03/05/16: wound is smaller. 03/11/16 wound continues to look smaller Electronic Signature(s) Signed: 03/13/2016 7:59:30 AM By: Linton Ham MD Entered By: Linton Ham on 03/11/2016 14:03:48 [name]  ([id number]) -------------------------------------------------------------------------------- Physical Exam Details Patient Name: [name] Date of Service: 03/11/2016 12:45 PM Medical Record Patient Account Number: [id number] Number: Treating RN: Ahmed Prima 1922-03-01 (82 y.o. Other Clinician: Date of Birth/Sex: Female) Treating ROBSON, MICHAEL Primary Care Physician/Extender: Pearson Forster Physician: Referring Physician: Joylene John in Treatment: 4 Notes Wound exam; again the wound continues to look smaller and healthy no debridement is necessary there is no depth. No evidence of infection Electronic Signature(s) Signed: 03/13/2016 7:59:30 AM By: Linton Ham MD Entered By: Linton Ham on 03/11/2016 14:04:15 [name] ([id number]) -------------------------------------------------------------------------------- Physician Orders Details Patient Name: [name]. Date of Service: 03/11/2016 12:45 PM Medical Record Patient Account Number: [id number] Number: Treating RN: Ahmed Prima 1922/09/19 (82 y.o. Other Clinician: Date of Birth/Sex: Female) Treating ROBSON, MICHAEL Primary Care Physician/Extender: Pearson Forster Physician: Referring Physician: Joylene John in Treatment: 4 Verbal / Phone Orders: Yes Clinician: Pinkerton, Debi Read Back and Verified: Yes Diagnosis Coding Wound Cleansing Wound #5 Left,Proximal Lower Leg o Clean wound with Normal Saline. o Cleanse wound with mild soap and water Anesthetic Wound #5 Left,Proximal Lower Leg o Topical Lidocaine 4% cream applied to wound bed prior to debridement Skin Barriers/Peri-Wound Care Wound #5 Left,Proximal Lower Leg o Skin Prep Primary Wound Dressing Wound #5 Left,Proximal Lower Leg o Aquacel Ag Secondary Dressing Wound #5 Left,Proximal Lower Leg o Boardered Foam Dressing Dressing Change Frequency Wound #5  Left,Proximal Lower Leg o Change dressing every other day. Follow-up Appointments Wound #5 Left,Proximal Lower Leg o Return Appointment in 1 week. Edema Control Wound #5 Left,Proximal Lower Leg o Elevate legs to the level of the heart and pump ankles as often as possible TENEKA, BATON. ([id number]) Additional Orders /  Instructions Wound #5 Left,Proximal Lower Leg o Increase protein intake. Medications-please add to medication list. Wound #5 Left,Proximal Lower Leg o Other: - Vitamin C, Zinc Multivitamin Electronic Signature(s) Signed: 03/11/2016 5:04:44 PM By: Alric Quan Signed: 03/13/2016 7:59:30 AM By: Linton Ham MD Entered By: Alric Quan on 03/11/2016 13:58:57 [name] ([id number]) -------------------------------------------------------------------------------- Problem List Details Patient Name: HALEA, DANIELSEN. Date of Service: 03/11/2016 12:45 PM Medical Record Patient Account Number: [id number] Number: Treating RN: Ahmed Prima [birth year] (82 y.o. Other Clinician: Date of Birth/Sex: Female) Treating ROBSON, MICHAEL Primary Care Physician/Extender: Pearson Forster Physician: Referring Physician: Joylene John in Treatment: 4 Active Problems ICD-10 Encounter Code Description Active Date Diagnosis I87.333 Chronic venous hypertension (idiopathic) with ulcer and 02/12/2016 Yes inflammation of bilateral lower extremity C44.82 Squamous cell carcinoma of overlapping sites of skin 02/12/2016 Yes I48.2 Chronic atrial fibrillation 02/12/2016 Yes Inactive Problems Resolved Problems Electronic Signature(s) Signed: 03/13/2016 7:59:30 AM By: Linton Ham MD Entered By: Linton Ham on 03/11/2016 14:02:47 [name] ([id number]) -------------------------------------------------------------------------------- Progress Note Details Patient Name: [name]. Date of Service: 03/11/2016 12:45  PM Medical Record Patient Account Number: [id number] Number: Treating RN: Ahmed Prima May 07, 1922 (82 y.o. Other Clinician: Date of Birth/Sex: Female) Treating ROBSON, MICHAEL Primary Care Physician/Extender: Pearson Forster Physician: Referring Physician: Joylene John in Treatment: 4 Subjective Chief Complaint Information obtained from Patient Patient returns to the wound care center for new ulcer to: left lower extremity lateral part of the calf. This has been there for about a month. About 4 days ago she injured her right calf area when she was applying some ointment and she may have scratched herself with a nail. 02/12/16; patient returns today for a two- month history of a nonhealing ulcer on her upper left lateral leg just below the fibular head History of Present Illness (HPI) The patient is a pleasant 82 year old with a past medical history significant for aortic valve replacement (on Coumadin), pacemaker, CHF, abdominal aortic aneurysm, and CVA. No history of diabetes or peripheral vascular disease. She underwent excision of a squamous cell cancer on her right upper arm and left dorsal foot in early August 2015 at the [facility]. Shave biopsy on May 11, 2014 showed "superficially invasive squamous cell carcinoma". She subsequently underwent wide excision with clear margins. She presented to the wound clinic with full thickness open surgical wounds. No symptoms to suggest ischemic rest pain or claudication. ABI 1.1. Cultures of L foot on August 09, 2014 grew ORSA sensitive to tigecycline. She has completed a course of doxycycline. 02/06/2015. -- This time around she had multiple furuncles over her left lower extremity and some on her right too. Was treated by her primary care physician with doxycycline and also was treated for shingles. Most of these ulcerations had healed primarily but the one on her left lower extremity  lateral part is open and has continued to drain for about a month. She has minimal pain over this area. She has been using some local ointment and a dressing has been put over this. Last year she had a full vascular workup at [facility] in [place] and saw Dr. Donnetta Hutching for an opinion regarding arterial insufficiency. though she had significant arterial insufficiency a major surgical procedure was not recommended because of her age. Conservative therapy was recommended. The patient has had some vascular vein surgery done about 6 years ago in this hospital. 03/26/2015 -- she now has several other issues going with a cardiac  problem and has been quite worried about this. Her leg is doing fine otherwise. 04/02/2015 -- all her chest and abdominal pain turned out to be just chronic constipation and a cardiac workup was fine. She is much relieved about the news. KRISIE, CYPHERS ([id number]) 02/12/16; this is a patient that we have had previously in this clinic. She has severe bilateral venous hypertension with hemosiderin deposition. She also has a history of squamous cell CA with a skin having a cancer excised from her left dorsal foot previously. She is followed by Dr. Redmond Pulling at the skin surgery Center in [place]. She also appears to of had a history of lower extremity wounds probably related to venous insufficiency. I note that she has a history of arterial insufficiency by looking at the notes above. Her ABIs were noncompressible in this clinic. Her last arterial Doppler in [facility] from 2014 suggested monophasic waves bilaterally. She had a right ABI of 0.68 and a left of 0.78. She had diffuse plaque throughout the bilateral femoral-popliteal large artery system no flow was adequately detected within the right proximal popliteal suggestive of total occlusion. I don't think she was ever attempted to be revascularized in any case the history is that she has had nonhealing wounds on the  left leg for 2 months. These apparently almost healed over using honey that they had at home. Ever she bumped the leg against the bed and they reopened. She also has excoriated lesions over her predominant right lateral thigh she scratches these continuously. She sews me small raised areas on her legs which she states resulted in the wounds that she has on the left leg 02/19/16; the patient arrives with the condition of her wound largely unchanged. She saw her dermatologist last week who also looked at this wound. Apparently she went and had an biopsied an area on her left upper arm the thought that the biopsy result of the arm may give [place] insight into the pathogenesis of the wound just below her fibular head were at least that's what I understand. She also has significant surrounding venous insufficiency. Multiple skin cancers. Known PAD 03/05/16: wound is smaller. 03/11/16 wound continues to look smaller Objective Constitutional Vitals Time Taken: 1:12 PM, Height: 63 in, Weight: 142 lbs, BMI: 25.2, Pulse: 72 bpm, Pulse Oximetry: 98 %. General Notes: unable to obtain blood pressure because pt had a blockage in her right arm and she had skin cancer taken off her left arm and had a lot of bruising. Integumentary (Hair, Skin) Wound #5 status is Open. Original cause of wound was Trauma. The wound is located on the Left,Proximal Lower Leg. The wound measures 0.3cm length x 0.2cm width x 0.1cm depth; 0.047cm^2 area and 0.005cm^3 volume. The wound is limited to skin breakdown. There is no tunneling or undermining noted. There is a large amount of serosanguineous drainage noted. The wound margin is flat and intact. There is small (1-33%) red granulation within the wound bed. There is a large (67-100%) amount of necrotic tissue within the wound bed including Eschar and Adherent Slough. The periwound skin appearance exhibited: Localized Edema, Moist, Erythema. The surrounding wound skin color is noted  with erythema which is circumferential. Periwound temperature was noted as No Abnormality. The periwound has tenderness on [place]. ([id number]) palpation. Assessment Active Problems ICD-10 I87.333 - Chronic venous hypertension (idiopathic) with ulcer and inflammation of bilateral lower extremity C44.82 - Squamous cell carcinoma of overlapping sites of skin I48.2 - Chronic atrial fibrillation Plan Wound  Cleansing: Wound #5 Left,Proximal Lower Leg: Clean wound with Normal Saline. Cleanse wound with mild soap and water Anesthetic: Wound #5 Left,Proximal Lower Leg: Topical Lidocaine 4% cream applied to wound bed prior to debridement Skin Barriers/Peri-Wound Care: Wound #5 Left,Proximal Lower Leg: Skin Prep Primary Wound Dressing: Wound #5 Left,Proximal Lower Leg: Aquacel Ag Secondary Dressing: Wound #5 Left,Proximal Lower Leg: Boardered Foam Dressing Dressing Change Frequency: Wound #5 Left,Proximal Lower Leg: Change dressing every other day. Follow-up Appointments: Wound #5 Left,Proximal Lower Leg: Return Appointment in 1 week. Edema Control: Wound #5 Left,Proximal Lower Leg: Elevate legs to the level of the heart and pump ankles as often as possible Additional Orders / Instructions: Wound #5 Left,Proximal Lower Leg: Increase protein intake. Medications-please add to medication list.: YANIAH, NORFLEET ([id number]) Wound #5 Left,Proximal Lower Leg: Other: - Vitamin C, Zinc Multivitamin #1 we will continue with the Aquacel at AG-based dressing as the wound continues to get smaller she has been covering this with border foam Electronic Signature(s) Signed: 03/13/2016 7:59:30 AM By: Linton Ham MD Entered By: Linton Ham on 03/11/2016 14:05:46 [name] ([id number]) -------------------------------------------------------------------------------- SuperBill Details Patient Name: [name]. Date of Service: 03/11/2016 Medical Record Patient  Account Number: [id number] Number: Treating RN: Ahmed Prima Aug 29, 1922 (82 y.o. Other Clinician: Date of Birth/Sex: Female) Treating ROBSON, MICHAEL Primary Care Physician/Extender: Pearson Forster Physician: Weeks in Treatment: 4 Referring Physician: Pernell Dupre Diagnosis Coding ICD-10 Codes Code Description Chronic venous hypertension (idiopathic) with ulcer and inflammation of bilateral lower I87.333 extremity C44.82 Squamous cell carcinoma of overlapping sites of skin I48.2 Chronic atrial fibrillation Facility Procedures CPT4 Code: [id number] Description: 99213 - WOUND CARE VISIT-LEV 3 EST PT Modifier: Quantity: 1 Physician Procedures CPT4: Description Modifier Quantity Code [id number] - WC PHYS LEVEL 2 - EST PT 1 ICD-10 Description Diagnosis I87.333 Chronic venous hypertension (idiopathic) with ulcer and inflammation of bilateral lower extremity Electronic Signature(s) Signed: 03/11/2016 5:04:44 PM By: Alric Quan Signed: 03/13/2016 7:59:30 AM By: Linton Ham MD Entered By: Alric Quan on 03/11/2016 14:15:58

## 2016-03-18 ENCOUNTER — Encounter: Payer: Medicare Other | Attending: Internal Medicine | Admitting: Internal Medicine

## 2016-03-18 DIAGNOSIS — C4482 Squamous cell carcinoma of overlapping sites of skin: Secondary | ICD-10-CM | POA: Diagnosis not present

## 2016-03-18 DIAGNOSIS — I509 Heart failure, unspecified: Secondary | ICD-10-CM | POA: Diagnosis not present

## 2016-03-18 DIAGNOSIS — Z950 Presence of cardiac pacemaker: Secondary | ICD-10-CM | POA: Insufficient documentation

## 2016-03-18 DIAGNOSIS — Z952 Presence of prosthetic heart valve: Secondary | ICD-10-CM | POA: Diagnosis not present

## 2016-03-18 DIAGNOSIS — Z8673 Personal history of transient ischemic attack (TIA), and cerebral infarction without residual deficits: Secondary | ICD-10-CM | POA: Insufficient documentation

## 2016-03-18 DIAGNOSIS — L97811 Non-pressure chronic ulcer of other part of right lower leg limited to breakdown of skin: Secondary | ICD-10-CM | POA: Diagnosis not present

## 2016-03-18 DIAGNOSIS — L97821 Non-pressure chronic ulcer of other part of left lower leg limited to breakdown of skin: Secondary | ICD-10-CM | POA: Diagnosis not present

## 2016-03-18 DIAGNOSIS — I714 Abdominal aortic aneurysm, without rupture: Secondary | ICD-10-CM | POA: Insufficient documentation

## 2016-03-18 DIAGNOSIS — M1990 Unspecified osteoarthritis, unspecified site: Secondary | ICD-10-CM | POA: Diagnosis not present

## 2016-03-18 DIAGNOSIS — Z880 Allergy status to penicillin: Secondary | ICD-10-CM | POA: Diagnosis not present

## 2016-03-18 DIAGNOSIS — I87333 Chronic venous hypertension (idiopathic) with ulcer and inflammation of bilateral lower extremity: Secondary | ICD-10-CM | POA: Insufficient documentation

## 2016-03-18 DIAGNOSIS — I482 Chronic atrial fibrillation: Secondary | ICD-10-CM | POA: Diagnosis not present

## 2016-03-19 NOTE — Progress Notes (Signed)
EVAMARIE, BLUTH ([id number]) Visit Report for 03/18/2016 Arrival Information Details Patient Name: [name], [name]. Date of Service: 03/18/2016 1:30 PM Medical Record Patient Account Number: [id number] Number: Treating RN: Ahmed Prima 1921/11/22 (82 y.o. Other Clinician: Date of Birth/Sex: Female) Treating ROBSON, [place] Primary Care Physician: Pernell Dupre Physician/Extender: G Referring Physician: Joylene John in Treatment: 5 Visit Information History Since Last Visit All ordered tests and consults were completed: No Patient Arrived: Wheel Chair Added or deleted any medications: No Arrival Time: 13:45 Any new allergies or adverse reactions: No Accompanied By: niece Had a fall or experienced change in No Transfer Assistance: EasyPivot Patient activities of daily living that may affect Lift risk of falls: Patient Identification Verified: Yes Signs or symptoms of abuse/neglect since last No Secondary Verification Process Yes visito Completed: Hospitalized since last visit: No Patient Requires Transmission- No Pain Present Now: No Based Precautions: Patient Has Alerts: Yes Patient Alerts: Patient on Blood Thinner Coumadin Electronic Signature(s) Signed: 03/18/2016 4:42:38 PM By: Alric Quan Entered By: Alric Quan on 03/18/2016 13:46:34 [name] ([id number]) -------------------------------------------------------------------------------- Clinic Level of Care Assessment Details Patient Name: [name]. Date of Service: 03/18/2016 1:30 PM Medical Record Patient Account Number: [id number] Number: Treating RN: Ahmed Prima 1922/07/21 (82 y.o. Other Clinician: Date of Birth/Sex: Female) Treating ROBSON, [place] Primary Care Physician: Pernell Dupre Physician/Extender: G Referring Physician: Joylene John in Treatment: 5 Clinic Level of Care Assessment Items TOOL 4 Quantity Score X - Use  when only an EandM is performed on FOLLOW-UP visit 1 0 ASSESSMENTS - Nursing Assessment / Reassessment X - Reassessment of Co-morbidities (includes updates in patient status) 1 10 X - Reassessment of Adherence to Treatment Plan 1 5 ASSESSMENTS - Wound and Skin Assessment / Reassessment X - Simple Wound Assessment / Reassessment - one wound 1 5 []  - Complex Wound Assessment / Reassessment - multiple wounds 0 []  - Dermatologic / Skin Assessment (not related to wound area) 0 ASSESSMENTS - Focused Assessment []  - Circumferential Edema Measurements - multi extremities 0 []  - Nutritional Assessment / Counseling / Intervention 0 []  - Lower Extremity Assessment (monofilament, tuning fork, pulses) 0 []  - Peripheral Arterial Disease Assessment (using hand held doppler) 0 ASSESSMENTS - Ostomy and/or Continence Assessment and Care []  - Incontinence Assessment and Management 0 []  - Ostomy Care Assessment and Management (repouching, etc.) 0 PROCESS - Coordination of Care X - Simple Patient / Family Education for ongoing care 1 15 []  - Complex (extensive) Patient / Family Education for ongoing care 0 []  - Staff obtains Programmer, systems, Records, Test Results / Process Orders 0 []  - Staff telephones HHA, Nursing Homes / Clarify orders / etc 0 [name], [name] F. ([id number]) []  - Routine Transfer to another Facility (non-emergent condition) 0 []  - Routine Hospital Admission (non-emergent condition) 0 []  - New Admissions / Biomedical engineer / Ordering NPWT, Apligraf, etc. 0 []  - Emergency Hospital Admission (emergent condition) 0 []  - Simple Discharge Coordination 0 X - Complex (extensive) Discharge Coordination 1 15 PROCESS - Special Needs []  - Pediatric / Minor Patient Management 0 []  - Isolation Patient Management 0 []  - Hearing / Language / Visual special needs 0 []  - Assessment of Community assistance (transportation, D/C planning, etc.) 0 []  - Additional assistance / Altered mentation 0 []  -  Support Surface(s) Assessment (bed, cushion, seat, etc.) 0 INTERVENTIONS - Wound Cleansing / Measurement X - Simple Wound Cleansing - one wound 1 5 []  - Complex Wound Cleansing - multiple wounds 0  X - Wound Imaging (photographs - any number of wounds) 1 5 []  - Wound Tracing (instead of photographs) 0 []  - Simple Wound Measurement - one wound 0 []  - Complex Wound Measurement - multiple wounds 0 INTERVENTIONS - Wound Dressings []  - Small Wound Dressing one or multiple wounds 0 []  - Medium Wound Dressing one or multiple wounds 0 []  - Large Wound Dressing one or multiple wounds 0 []  - Application of Medications - topical 0 []  - Application of Medications - injection 0 [name], ELS. ([id number]) INTERVENTIONS - Miscellaneous []  - External ear exam 0 []  - Specimen Collection (cultures, biopsies, blood, body fluids, etc.) 0 []  - Specimen(s) / Culture(s) sent or taken to Lab for analysis 0 []  - Patient Transfer (multiple staff / Harrel Lemon Lift / Similar devices) 0 []  - Simple Staple / Suture removal (25 or less) 0 []  - Complex Staple / Suture removal (26 or more) 0 []  - Hypo / Hyperglycemic Management (close monitor of Blood Glucose) 0 []  - Ankle / Brachial Index (ABI) - do not check if billed separately 0 X - Vital Signs 1 5 Has the patient been seen at the hospital within the last three years: Yes Total Score: 65 Level Of Care: New/Established - Level 2 Electronic Signature(s) Signed: 03/18/2016 4:42:38 PM By: Alric Quan Entered By: Alric Quan on 03/18/2016 14:47:51 [name] ([id number]) -------------------------------------------------------------------------------- Encounter Discharge Information Details Patient Name: [name]. Date of Service: 03/18/2016 1:30 PM Medical Record Patient Account Number: [id number] Number: Treating RN: Ahmed Prima Apr 07, 1922 (82 y.o. Other Clinician: Date of Birth/Sex: Female) Treating ROBSON, MICHAEL Primary  Care Physician: Pernell Dupre Physician/Extender: G Referring Physician: Joylene John in Treatment: 5 Encounter Discharge Information Items Discharge Pain Level: 0 Discharge Condition: Stable Ambulatory Status: Wheelchair Discharge Destination: Home Transportation: Private Auto Accompanied By: niece Schedule Follow-up Appointment: Yes Medication Reconciliation completed Yes and provided to Patient/Care Jontez Redfield: Provided on Clinical Summary of Care: 03/18/2016 Form Type Recipient Paper Patient JW Electronic Signature(s) Signed: 03/18/2016 4:42:38 PM By: Alric Quan Previous Signature: 03/18/2016 2:13:24 PM Version By: Ruthine Dose Entered By: Alric Quan on 03/18/2016 16:23:28 [name] ([id number]) -------------------------------------------------------------------------------- Lower Extremity Assessment Details Patient Name: [name]. Date of Service: 03/18/2016 1:30 PM Medical Record Patient Account Number: [id number] Number: Treating RN: Ahmed Prima March 06, 1922 (82 y.o. Other Clinician: Date of Birth/Sex: Female) Treating ROBSON, [employer] Primary Care Physician: Pernell Dupre Physician/Extender: G Referring Physician: Joylene John in Treatment: 5 Vascular Assessment Pulses: Posterior Tibial Dorsalis Pedis Palpable: [Left:No] Doppler: [Left:Monophasic] Extremity colors, hair growth, and conditions: Extremity Color: [Left:Hyperpigmented] Temperature of Extremity: [Left:Warm] Capillary Refill: [Left:< 3 seconds] Toe Nail Assessment Left: Right: Thick: No Discolored: No Deformed: No Improper Length and Hygiene: No Electronic Signature(s) Signed: 03/18/2016 4:42:38 PM By: Alric Quan Entered By: Alric Quan on 03/18/2016 13:51:28 [name] ([id number]) -------------------------------------------------------------------------------- Multi Wound Chart Details Patient Name: [name]. Date of Service: 03/18/2016 1:30 PM Medical Record Patient Account Number: [id number] Number: Treating RN: Ahmed Prima Nov 08, 1922 (82 y.o. Other Clinician: Date of Birth/Sex: Female) Treating ROBSON, [place] Primary Care Physician: Pernell Dupre Physician/Extender: G Referring Physician: Joylene John in Treatment: 5 Photos: [5:No Photos] [N/A:N/A] Wound Location: [5:Left Lower Leg - Proximal N/A] Wounding Event: [5:Trauma] [N/A:N/A] Primary Etiology: [5:Trauma, Other] [N/A:N/A] Comorbid History: [5:Cataracts, Angina, Arrhythmia, Congestive Heart Failure, Hypertension, Hepatitis B, Osteoarthritis, Seizure Disorder] [N/A:N/A] Date Acquired: [5:12/15/2015] [N/A:N/A] Weeks of Treatment: [5:5] [N/A:N/A] Wound Status: [5:Open] [N/A:N/A] Measurements L x W  x D 0.1x0.1x0.1 [N/A:N/A] (cm) Area (cm) : [5:0.008] [N/A:N/A] Volume (cm) : [5:0.001] [N/A:N/A] % Reduction in Area: [5:99.60%] [N/A:N/A] % Reduction in Volume: 99.50% [N/A:N/A] Classification: [5:Partial Thickness] [N/A:N/A] Exudate Amount: [5:Large] [N/A:N/A] Exudate Type: [5:Serosanguineous] [N/A:N/A] Exudate Color: [5:red, brown] [N/A:N/A] Wound Margin: [5:Flat and Intact] [N/A:N/A] Granulation Amount: [5:Small (1-33%)] [N/A:N/A] Granulation Quality: [5:Red] [N/A:N/A] Necrotic Amount: [5:Large (67-100%)] [N/A:N/A] Necrotic Tissue: [5:Eschar, Adherent Slough N/A] Exposed Structures: [5:Fascia: No Fat: No Tendon: No Muscle: No Joint: No Bone: No Limited to Skin Breakdown] [N/A:N/A] Epithelialization: None N/A N/A Periwound Skin Texture: Edema: Yes N/A N/A Periwound Skin Moist: Yes N/A N/A Moisture: Periwound Skin Color: Erythema: Yes N/A N/A Erythema Location: Circumferential N/A N/A Temperature: No Abnormality N/A N/A Tenderness on Yes N/A N/A Palpation: Wound Preparation: Ulcer Cleansing: N/A N/A Rinsed/Irrigated with Saline Topical Anesthetic Applied: Other:  lidocaine 4% Treatment Notes Electronic Signature(s) Signed: 03/18/2016 4:42:38 PM By: Alric Quan Entered By: Alric Quan on 03/18/2016 13:52:40 [name] ([id number]) -------------------------------------------------------------------------------- [facility] Details Patient Name: [name]. Date of Service: 03/18/2016 1:30 PM Medical Record Patient Account Number: [id number] Number: Treating RN: Ahmed Prima February 29, 1922 (82 y.o. Other Clinician: Date of Birth/Sex: Female) Treating ROBSON, MICHAEL Primary Care Physician: Pernell Dupre Physician/Extender: G Referring Physician: Joylene John in Treatment: 5 Active Inactive Electronic Signature(s) Signed: 03/18/2016 4:42:38 PM By: Alric Quan Entered By: Alric Quan on 03/18/2016 16:24:19 [name] ([id number]) -------------------------------------------------------------------------------- Pain Assessment Details Patient Name: [name]. Date of Service: 03/18/2016 1:30 PM Medical Record Patient Account Number: [id number] Number: Treating RN: Ahmed Prima [birth year] (82 y.o. Other Clinician: Date of Birth/Sex: Female) Treating ROBSON, MICHAEL Primary Care Physician: Pernell Dupre Physician/Extender: G Referring Physician: Joylene John in Treatment: 5 Active Problems Location of Pain Severity and Description of Pain Patient Has Paino No Site Locations Pain Management and Medication Current Pain Management: Electronic Signature(s) Signed: 03/18/2016 4:42:38 PM By: Alric Quan Entered By: Alric Quan on 03/18/2016 13:47:48 [name] ([id number]) -------------------------------------------------------------------------------- Patient/Caregiver Education Details Patient Name: [name]. Date of Service: 03/18/2016 1:30 PM Medical Record Patient Account Number:  [id number] Number: Treating RN: Ahmed Prima 24-Dec-1921 (82 y.o. Other Clinician: Date of Birth/Gender: Female) Treating ROBSON, [place] Primary Care Physician: Pernell Dupre Physician/Extender: G Referring Physician: Joylene John in Treatment: 5 Education Assessment Education Provided To: Patient Education Topics Provided Wound/Skin Impairment: Other: Protect wound, keep clean and dry. If you need anything or have any questions please Handouts: call us. Methods: Demonstration, Explain/Verbal Responses: State content correctly Electronic Signature(s) Signed: 03/18/2016 4:42:38 PM By: Alric Quan Entered By: Alric Quan on 03/18/2016 14:05:43 [name] ([id number]) -------------------------------------------------------------------------------- Wound Assessment Details Patient Name: [name]. Date of Service: 03/18/2016 1:30 PM Medical Record Patient Account Number: [id number] Number: Treating RN: Ahmed Prima 12-Nov-1922 (82 y.o. Other Clinician: Date of Birth/Sex: Female) Treating ROBSON, MICHAEL Primary Care Physician: Pernell Dupre Physician/Extender: G Referring Physician: Joylene John in Treatment: 5 Wound Status Wound Number: 5 Primary Trauma, Other Etiology: Wound Location: Left Lower Leg - Proximal Wound Open Wounding Event: Trauma Status: Date Acquired: 12/15/2015 Comorbid Cataracts, Angina, Arrhythmia, Weeks Of Treatment: 5 History: Congestive Heart Failure, Hypertension, Clustered Wound: No Hepatitis B, Osteoarthritis, Seizure Disorder Photos Photo Uploaded By: Alric Quan on 03/18/2016 16:27:13 Wound Measurements Length: (cm) 0 % Reduction i Width: (cm) 0 % Reduction i Depth: (cm) 0 Epithelializa Area: (cm) 0 Tunneling: Volume: (cm) 0 Undermining: n Area: 100% n Volume: 100% tion: Large (67-100%) No No  Wound Description Classification: Partial  Thickness Wound Margin: Flat and Intact Exudate Amount: None Present Foul Odor After Cleansing: No Wound Bed Granulation Amount: None Present (0%) Exposed Structure Necrotic Amount: None Present (0%) Fascia Exposed: No Fat Layer Exposed: No [name], PERUSSE. ([id number]) Tendon Exposed: No Muscle Exposed: No Joint Exposed: No Bone Exposed: No Limited to Skin Breakdown Periwound Skin Texture Texture Color No Abnormalities Noted: No No Abnormalities Noted: No Localized Edema: Yes Erythema: Yes Erythema Location: Circumferential Moisture No Abnormalities Noted: No Temperature / Pain Moist: Yes Temperature: No Abnormality Tenderness on Palpation: Yes Wound Preparation Ulcer Cleansing: Rinsed/Irrigated with Saline Topical Anesthetic Applied: Other: lidocaine 4%, Electronic Signature(s) Signed: 03/18/2016 4:42:38 PM By: Alric Quan Entered By: Alric Quan on 03/18/2016 14:03:35 [name] ([id number]) -------------------------------------------------------------------------------- Vitals Details Patient Name: [name]. Date of Service: 03/18/2016 1:30 PM Medical Record Patient Account Number: [id number] Number: Treating RN: Ahmed Prima 1921-12-25 (82 y.o. Other Clinician: Date of Birth/Sex: Female) Treating ROBSON, [place] Primary Care Physician: Pernell Dupre Physician/Extender: G Referring Physician: Joylene John in Treatment: 5 Vital Signs Time Taken: 13:47 Reference Range: 80 - 120 mg / dl Height (in): 63 Weight (lbs): 142 Body Mass Index (BMI): 25.2 Notes unable to obtain blood pressure because pt had a blockage in her right arm and she had skin cancer taken off her left arm and had a lot of bruising. Electronic Signature(s) Signed: 03/18/2016 4:42:38 PM By: Alric Quan Entered By: Alric Quan on 03/18/2016 13:48:02

## 2016-03-19 NOTE — Progress Notes (Signed)
[name], [name] ([id number]) Visit Report for 03/18/2016 Chief Complaint Document Details Patient Name: [name], [name]. Date of Service: 03/18/2016 1:30 PM Medical Record Patient Account Number: [id number] Number: Treating RN: Ahmed Prima August 10, 1922 (82 y.o. Other Clinician: Date of Birth/Sex: Female) Treating Tucker Steedley Primary Care Physician/Extender: Pearson Forster Physician: Referring Physician: Joylene John in Treatment: 5 Information Obtained from: Patient Chief Complaint Patient returns to the wound care center for new ulcer to: left lower extremity lateral part of the calf. This has been there for about a month. About 4 days ago she injured her right calf area when she was applying some ointment and she may have scratched herself with a nail. 02/12/16; patient returns today for a two-month history of a nonhealing ulcer on her upper left lateral leg just below the fibular head Electronic Signature(s) Signed: 03/19/2016 8:00:31 AM By: Linton Ham MD Entered By: Linton Ham on 03/18/2016 14:18:41 [name] ([id number]) -------------------------------------------------------------------------------- HPI Details Patient Name: [name]. Date of Service: 03/18/2016 1:30 PM Medical Record Patient Account Number: [id number] Number: Treating RN: Ahmed Prima 02-Jan-1922 (82 y.o. Other Clinician: Date of Birth/Sex: Female) Treating Leanord Thibeau Primary Care Physician/Extender: Pearson Forster Physician: Referring Physician: Joylene John in Treatment: 5 History of Present Illness HPI Description: The patient is a pleasant 82 year old with a past medical history significant for aortic valve replacement (on Coumadin), pacemaker, CHF, abdominal aortic aneurysm, and CVA. No history of diabetes or peripheral vascular disease. She underwent excision of a squamous cell cancer on her right upper arm  and left dorsal foot in early August 2015 at the [facility]. Shave biopsy on May 11, 2014 showed "superficially invasive squamous cell carcinoma". She subsequently underwent wide excision with clear margins. She presented to the wound clinic with full thickness open surgical wounds. No symptoms to suggest ischemic rest pain or claudication. ABI 1.1. Cultures of L foot on August 09, 2014 grew ORSA sensitive to tigecycline. She has completed a course of doxycycline. 02/06/2015. -- This time around she had multiple furuncles over her left lower extremity and some on her right too. Was treated by her primary care physician with doxycycline and also was treated for shingles. Most of these ulcerations had healed primarily but the one on her left lower extremity lateral part is open and has continued to drain for about a month. She has minimal pain over this area. She has been using some local ointment and a dressing has been put over this. Last year she had a full vascular workup at [facility] in [place] and saw Dr. Donnetta Hutching for an opinion regarding arterial insufficiency. though she had significant arterial insufficiency a major surgical procedure was not recommended because of her age. Conservative therapy was recommended. The patient has had some vascular vein surgery done about 6 years ago in this hospital. 03/26/2015 -- she now has several other issues going with a cardiac problem and has been quite worried about this. Her leg is doing fine otherwise. 04/02/2015 -- all her chest and abdominal pain turned out to be just chronic constipation and a cardiac workup was fine. She is much relieved about the news. 02/12/16; this is a patient that we have had previously in this clinic. She has severe bilateral venous hypertension with hemosiderin deposition. She also has a history of squamous cell CA with a skin having a cancer excised from her left dorsal foot previously. She  is followed by Dr. Redmond Pulling at the  skin surgery Center in [place]. She also appears to of had a history of lower extremity wounds probably related to venous insufficiency. I note that she has a history of arterial insufficiency by looking at the notes above. Her ABIs were noncompressible in this clinic. Her last arterial Doppler in [facility] from 2014 suggested monophasic waves bilaterally. She had a right ABI of 0.68 and a left of 0.78. She had diffuse plaque throughout the bilateral femoral-popliteal large artery system no flow was adequately detected within the [place]. ([id number]) right proximal popliteal suggestive of total occlusion. I don't think she was ever attempted to be revascularized in any case the history is that she has had nonhealing wounds on the left leg for 2 months. These apparently almost healed over using honey that they had at home. Ever she bumped the leg against the bed and they reopened. She also has excoriated lesions over her predominant right lateral thigh she scratches these continuously. She sews me small raised areas on her legs which she states resulted in the wounds that she has on the left leg 02/19/16; the patient arrives with the condition of her wound largely unchanged. She saw her dermatologist last week who also looked at this wound. Apparently she went and had an biopsied an area on her left upper arm the thought that the biopsy result of the arm may give [place] insight into the pathogenesis of the wound just below her fibular head were at least that's what I understand. She also has significant surrounding venous insufficiency. Multiple skin cancers. Known PAD 03/05/16: wound is smaller. 03/11/16 wound continues to look smaller 03/18/16; the wound is healed Electronic Signature(s) Signed: 03/19/2016 8:00:31 AM By: Linton Ham MD Entered By: Linton Ham on 03/18/2016 14:19:40 [name]  ([id number]) -------------------------------------------------------------------------------- Physical Exam Details Patient Name: [name] Date of Service: 03/18/2016 1:30 PM Medical Record Patient Account Number: [id number] Number: Treating RN: Ahmed Prima May 02, 1922 (82 y.o. Other Clinician: Date of Birth/Sex: Female) Treating Madylyn Insco Primary Care Physician/Extender: Pearson Forster Physician: Referring Physician: Joylene John in Treatment: 5 Eyes Conjunctivae clear. No discharge.[name] Respiratory Respiratory effort is easy and symmetric bilaterally. Rate is normal at rest and on room air.. Cardiovascular Pedal pulses palpable and strong bilaterally.. Edema present in both extremities.. Lymphatic Nonpalpable in popliteal or inguinal area. Psychiatric No evidence of depression, anxiety, or agitation. Calm, cooperative, and communicative. Appropriate interactions and affect.. Notes Wound exam; wound is carefully inspected. Some surface eschar removed. There is no open area here. The patient has severe bilateral venous insufficiency with hemosiderin deposition but no open wounds bilaterally. She has graded pressure stockings but does not wear them Electronic Signature(s) Signed: 03/19/2016 8:00:31 AM By: Linton Ham MD Entered By: Linton Ham on 03/18/2016 14:23:03 [name] ([id number]) -------------------------------------------------------------------------------- Physician Orders Details Patient Name: [name]. Date of Service: 03/18/2016 1:30 PM Medical Record Patient Account Number: [id number] Number: Treating RN: Ahmed Prima 1922-09-09 (82 y.o. Other Clinician: Date of Birth/Sex: Female) Treating Abilene Mcphee Primary Care Physician/Extender: Pearson Forster Physician: Referring Physician: Joylene John in Treatment: 5 Verbal / Phone Orders: Yes Clinician: Carolyne Fiscal,  Debi Read Back and Verified: Yes Diagnosis Coding Discharge From [facility] Services o Discharge from [place] wound, keep clean and dry. If you need anything or have any questions please call us. Electronic Signature(s) Signed: 03/18/2016 4:42:38 PM By: Alric Quan Signed: 03/19/2016 8:00:31 AM By: Linton Ham MD Entered By: Alric Quan  on 03/18/2016 14:04:56 [name], [name] ([id number]) -------------------------------------------------------------------------------- Problem List Details Patient Name: [name], [name]. Date of Service: 03/18/2016 1:30 PM Medical Record Patient Account Number: [id number] Number: Treating RN: Ahmed Prima 08-25-1922 (82 y.o. Other Clinician: Date of Birth/Sex: Female) Treating Sharonne Ricketts Primary Care Physician/Extender: Pearson Forster Physician: Referring Physician: Joylene John in Treatment: 5 Active Problems ICD-10 Encounter Code Description Active Date Diagnosis I87.333 Chronic venous hypertension (idiopathic) with ulcer and 02/12/2016 Yes inflammation of bilateral lower extremity C44.82 Squamous cell carcinoma of overlapping sites of skin 02/12/2016 Yes I48.2 Chronic atrial fibrillation 02/12/2016 Yes Inactive Problems Resolved Problems Electronic Signature(s) Signed: 03/19/2016 8:00:31 AM By: Linton Ham MD Entered By: Linton Ham on 03/18/2016 14:17:25 [name] ([id number]) -------------------------------------------------------------------------------- Progress Note Details Patient Name: [name]. Date of Service: 03/18/2016 1:30 PM Medical Record Patient Account Number: [id number] Number: Treating RN: Ahmed Prima [birth year] (82 y.o. Other Clinician: Date of Birth/Sex: Female) Treating Jong Rickman Primary Care Physician/Extender: Pearson Forster Physician: Referring Physician: Joylene John in Treatment:  5 Subjective Chief Complaint Information obtained from Patient Patient returns to the wound care center for new ulcer to: left lower extremity lateral part of the calf. This has been there for about a month. About 4 days ago she injured her right calf area when she was applying some ointment and she may have scratched herself with a nail. 02/12/16; patient returns today for a two- month history of a nonhealing ulcer on her upper left lateral leg just below the fibular head History of Present Illness (HPI) The patient is a pleasant 82 year old with a past medical history significant for aortic valve replacement (on Coumadin), pacemaker, CHF, abdominal aortic aneurysm, and CVA. No history of diabetes or peripheral vascular disease. She underwent excision of a squamous cell cancer on her right upper arm and left dorsal foot in early August 2015 at the [facility]. Shave biopsy on May 11, 2014 showed "superficially invasive squamous cell carcinoma". She subsequently underwent wide excision with clear margins. She presented to the wound clinic with full thickness open surgical wounds. No symptoms to suggest ischemic rest pain or claudication. ABI 1.1. Cultures of L foot on August 09, 2014 grew ORSA sensitive to tigecycline. She has completed a course of doxycycline. 02/06/2015. -- This time around she had multiple furuncles over her left lower extremity and some on her right too. Was treated by her primary care physician with doxycycline and also was treated for shingles. Most of these ulcerations had healed primarily but the one on her left lower extremity lateral part is open and has continued to drain for about a month. She has minimal pain over this area. She has been using some local ointment and a dressing has been put over this. Last year she had a full vascular workup at [facility] in [place] and saw Dr. Donnetta Hutching for an opinion regarding arterial insufficiency.  though she had significant arterial insufficiency a major surgical procedure was not recommended because of her age. Conservative therapy was recommended. The patient has had some vascular vein surgery done about 6 years ago in this hospital. 03/26/2015 -- she now has several other issues going with a cardiac problem and has been quite worried about this. Her leg is doing fine otherwise. 04/02/2015 -- all her chest and abdominal pain turned out to be just chronic constipation and a cardiac workup was fine. She is much relieved about the news. [name], [name] ([id number]) 02/12/16; this  is a patient that we have had previously in this clinic. She has severe bilateral venous hypertension with hemosiderin deposition. She also has a history of squamous cell CA with a skin having a cancer excised from her left dorsal foot previously. She is followed by Dr. Redmond Pulling at the skin surgery Center in [place]. She also appears to of had a history of lower extremity wounds probably related to venous insufficiency. I note that she has a history of arterial insufficiency by looking at the notes above. Her ABIs were noncompressible in this clinic. Her last arterial Doppler in [facility] from 2014 suggested monophasic waves bilaterally. She had a right ABI of 0.68 and a left of 0.78. She had diffuse plaque throughout the bilateral femoral-popliteal large artery system no flow was adequately detected within the right proximal popliteal suggestive of total occlusion. I don't think she was ever attempted to be revascularized in any case the history is that she has had nonhealing wounds on the left leg for 2 months. These apparently almost healed over using honey that they had at home. Ever she bumped the leg against the bed and they reopened. She also has excoriated lesions over her predominant right lateral thigh she scratches these continuously. She sews me small raised areas on her legs which she states  resulted in the wounds that she has on the left leg 02/19/16; the patient arrives with the condition of her wound largely unchanged. She saw her dermatologist last week who also looked at this wound. Apparently she went and had an biopsied an area on her left upper arm the thought that the biopsy result of the arm may give [place] insight into the pathogenesis of the wound just below her fibular head were at least that's what I understand. She also has significant surrounding venous insufficiency. Multiple skin cancers. Known PAD 03/05/16: wound is smaller. 03/11/16 wound continues to look smaller 03/18/16; the wound is healed Objective Constitutional Vitals Time Taken: 1:47 PM, Height: 63 in, Weight: 142 lbs, BMI: 25.2. General Notes: unable to obtain blood pressure because pt had a blockage in her right arm and she had skin cancer taken off her left arm and had a lot of bruising. Eyes Conjunctivae clear. No discharge.[name] Respiratory Respiratory effort is easy and symmetric bilaterally. Rate is normal at rest and on room air.. Cardiovascular Pedal pulses palpable and strong bilaterally.. Edema present in both extremities.[name], [name] ([id number]) Lymphatic Nonpalpable in popliteal or inguinal area. Psychiatric No evidence of depression, anxiety, or agitation. Calm, cooperative, and communicative. Appropriate interactions and affect.. General Notes: Wound exam; wound is carefully inspected. Some surface eschar removed. There is no open area here. The patient has severe bilateral venous insufficiency with hemosiderin deposition but no open wounds bilaterally. She has graded pressure stockings but does not wear them Integumentary (Hair, Skin) Wound #5 status is Open. Original cause of wound was Trauma. The wound is located on the Left,Proximal Lower Leg. The wound measures 0cm length x 0cm width x 0cm depth; 0cm^2 area and 0cm^3 volume. The wound is limited to skin breakdown. There is no  tunneling or undermining noted. There is a none present amount of drainage noted. The wound margin is flat and intact. There is no granulation within the wound bed. There is no necrotic tissue within the wound bed. The periwound skin appearance exhibited: Localized Edema, Moist, Erythema. The surrounding wound skin color is noted with erythema which is circumferential. Periwound temperature was noted as No Abnormality. The periwound  has tenderness on palpation. Assessment Active Problems ICD-10 I87.333 - Chronic venous hypertension (idiopathic) with ulcer and inflammation of bilateral lower extremity C44.82 - Squamous cell carcinoma of overlapping sites of skin I48.2 - Chronic atrial fibrillation Plan Discharge From [facility]: Discharge from [place] wound, keep clean and dry. If you need anything or have any questions please call us. [name], [name] ([id number]) #1 the patient can be discharged #2 I have recommended keeping this area protected for a period of time #3 we had some discussion about graded pressure stockings, the patient states she already has these but does not wear them #4 the patient is at high risk for further lower extremity skin ulcerations not even related to underlying skin cancers Electronic Signature(s) Signed: 03/19/2016 8:00:31 AM By: Linton Ham MD Entered By: Linton Ham on 03/18/2016 14:25:53 [name] ([id number]) -------------------------------------------------------------------------------- SuperBill Details Patient Name: [name]. Date of Service: 03/18/2016 Medical Record Patient Account Number: [id number] Number: Treating RN: Ahmed Prima [birth year] (82 y.o. Other Clinician: Date of Birth/Sex: Female) Treating Neco Kling Primary Care Physician/Extender: Pearson Forster Physician: Weeks in Treatment: 5 Referring Physician: Pernell Dupre Diagnosis Coding ICD-10  Codes Code Description Chronic venous hypertension (idiopathic) with ulcer and inflammation of bilateral lower I87.333 extremity C44.82 Squamous cell carcinoma of overlapping sites of skin I48.2 Chronic atrial fibrillation Facility Procedures CPT4 Code: [id number] Description: [id number] - WOUND CARE VISIT-LEV 2 EST PT Modifier: Quantity: 1 Physician Procedures CPT4: Description Modifier Quantity Code [id number] - WC PHYS LEVEL 3 - EST PT 1 ICD-10 Description Diagnosis I87.333 Chronic venous hypertension (idiopathic) with ulcer and inflammation of bilateral lower extremity Electronic Signature(s) Signed: 03/18/2016 4:42:38 PM By: Alric Quan Signed: 03/19/2016 8:00:31 AM By: Linton Ham MD Entered By: Alric Quan on 03/18/2016 14:48:01

## 2016-03-24 IMAGING — US US EXTREM LOW VENOUS*R*
1 series · 14 of 24 positions shown · non-contrast
Comparison: 07/26/2012

CLINICAL DATA: Redness, swelling, PAIN.  VARICOSE/SPIDER VEINS.

EXAM:
RIGHT LOWER EXTREMITY VENOUS DOPPLER ULTRASOUND
TECHNIQUE: Gray-scale sonography with compression, as well as color and duplex
ultrasound, were performed to evaluate the deep venous system from
the level of the common femoral vein through the popliteal and
proximal calf veins.

[Series 1: us extrem low venous*right* · 0.08mm/px · 14 of 40 slices shown]
[im 1/40]
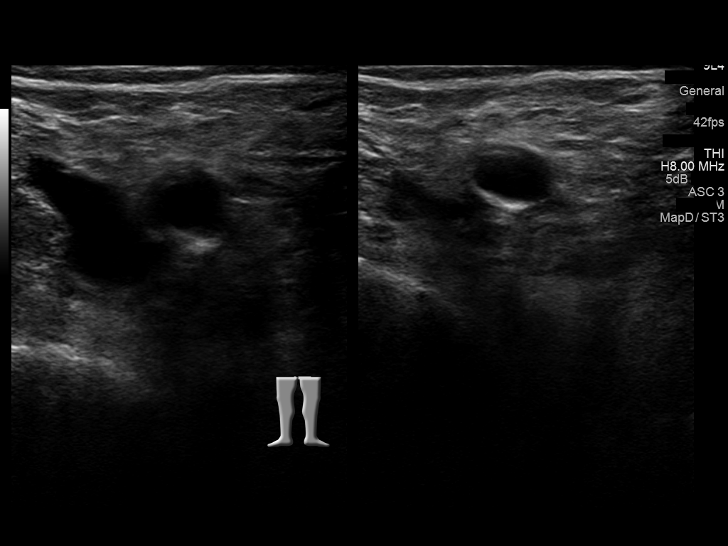
[im 4/40]
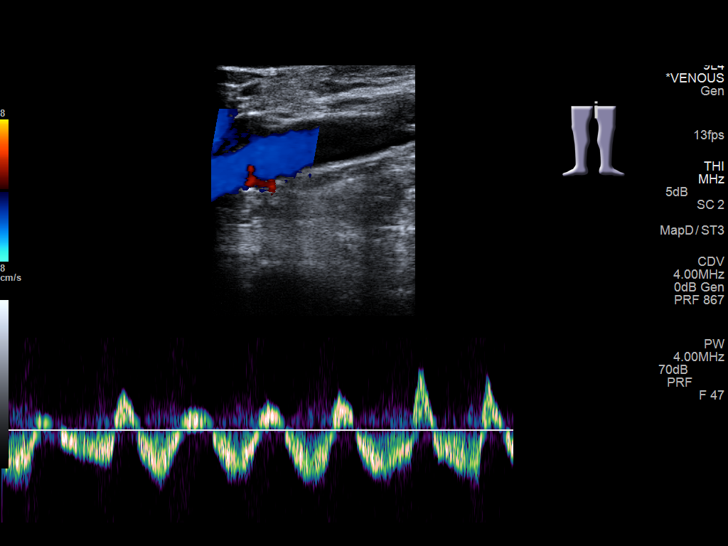
[im 7/40]
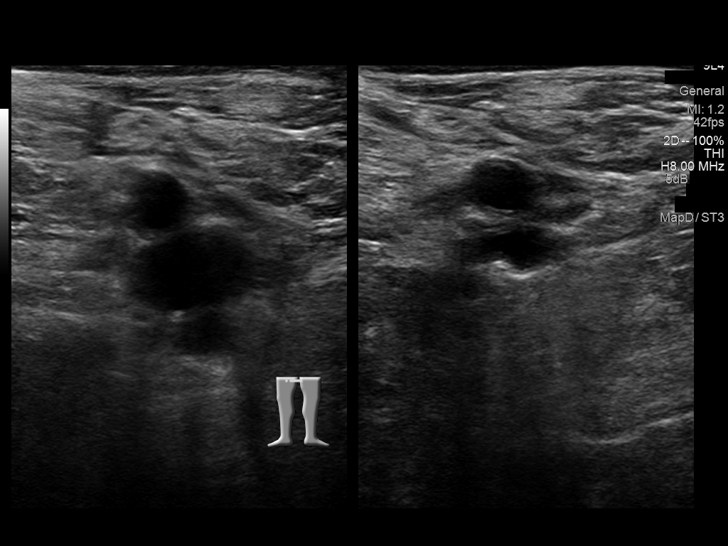
[im 11/40]
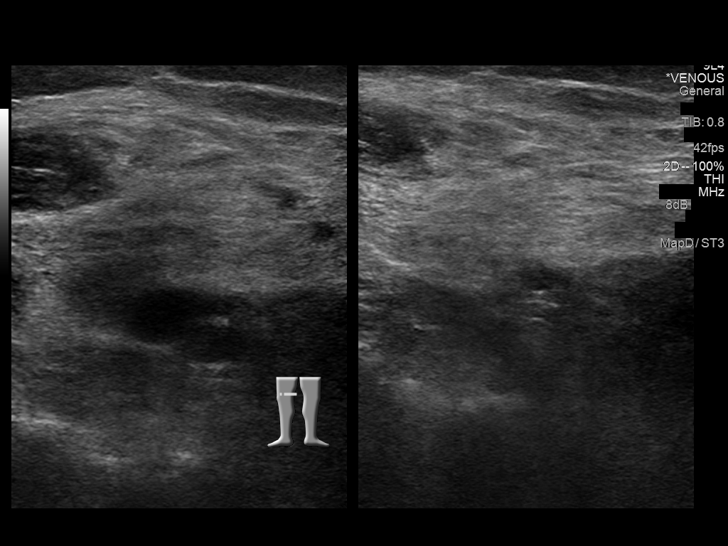
[im 12/40]
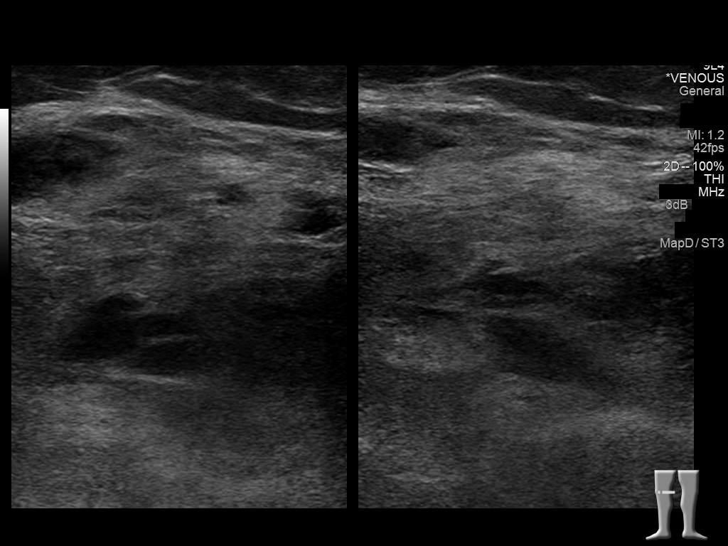
[im 16/40]
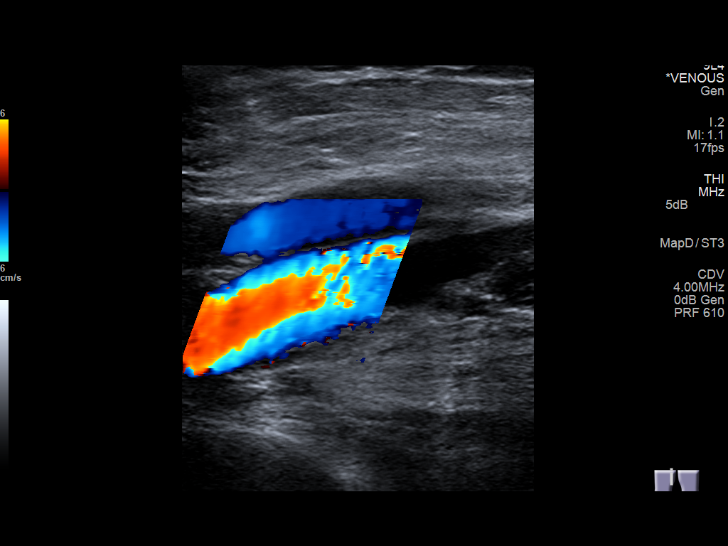
[im 19/40]
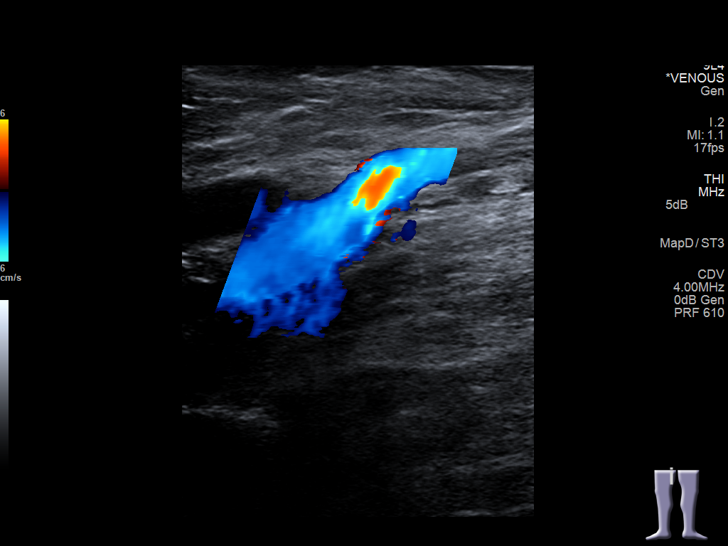
[im 21/40]
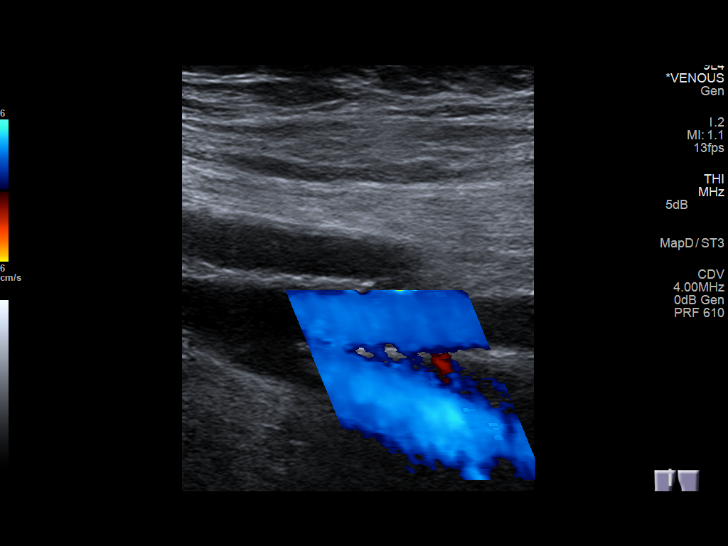
[im 24/40]
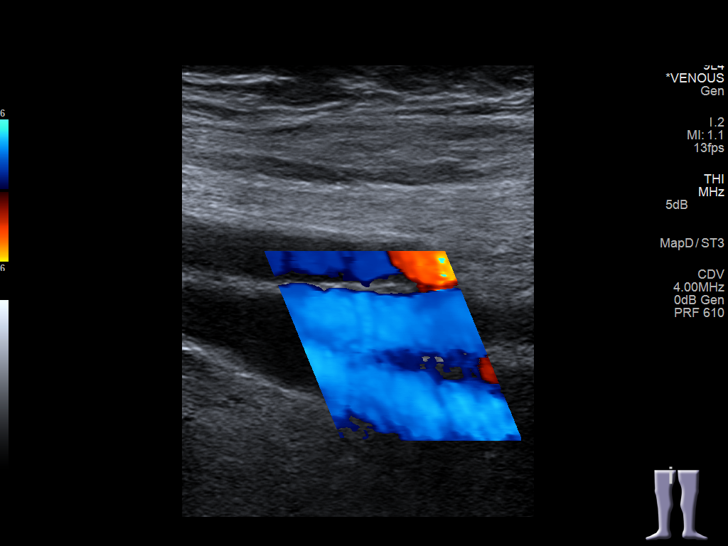
[im 28/40]
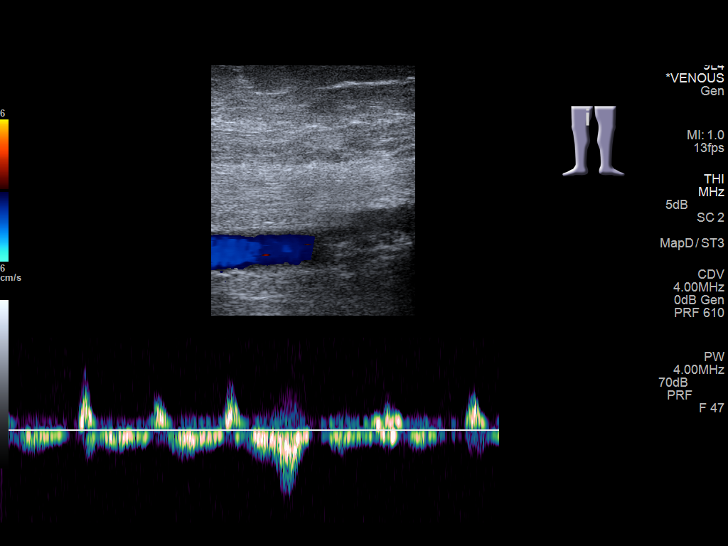
[im 31/40]
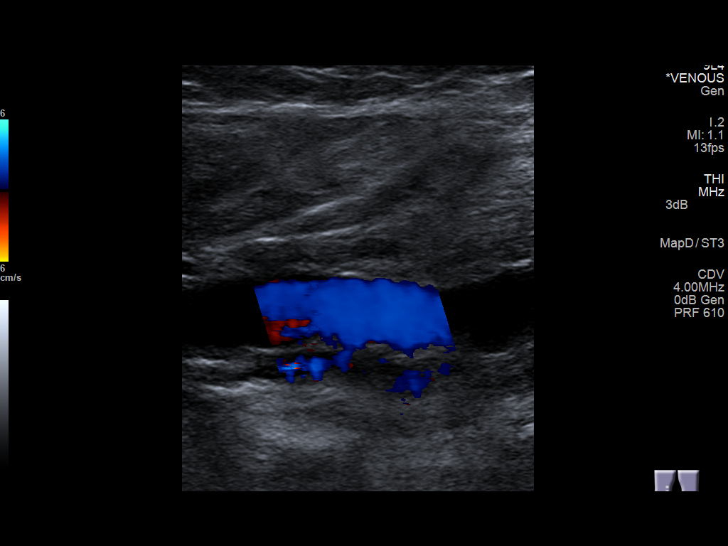
[im 33/40]
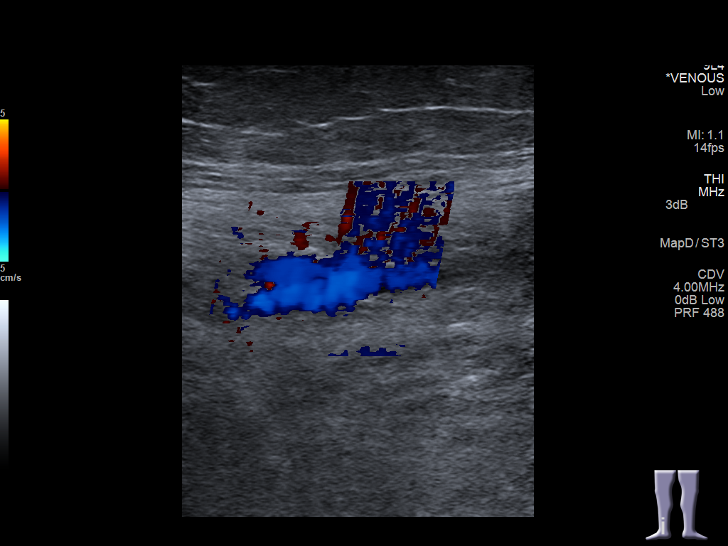
[im 36/40]
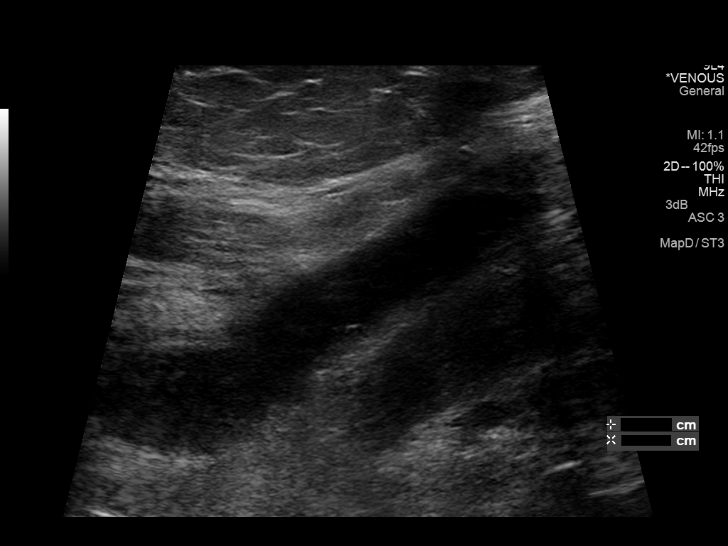
[im 40/40]
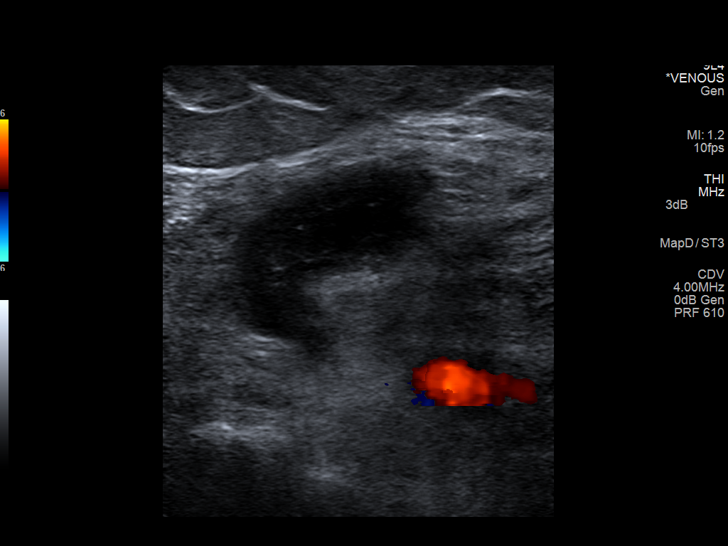

[14 of 24 positions shown; findings below may reference images not displayed]

FINDINGS: Normal compressibility of the common femoral, superficial femoral,
and popliteal veins, as well as the proximal calf veins. No filling
defects to suggest DVT on grayscale or color Doppler imaging.
Doppler waveforms show normal direction of venous flow, normal
respiratory phasicity and response to augmentation. Elongated 54 x 9
x 18 mm hypoechoic collection in the posterior popliteal fossa.
Survey views of the contralateral common femoral vein are
unremarkable.
IMPRESSION: 1. No evidence of lower extremity deep vein thrombosis, RIGHT.
2. Small right Baker cyst.

## 2016-03-25 ENCOUNTER — Other Ambulatory Visit: Payer: Self-pay | Admitting: Student

## 2016-03-25 DIAGNOSIS — R1310 Dysphagia, unspecified: Secondary | ICD-10-CM

## 2016-03-28 ENCOUNTER — Ambulatory Visit: Payer: Medicare Other

## 2016-03-31 ENCOUNTER — Ambulatory Visit (INDEPENDENT_AMBULATORY_CARE_PROVIDER_SITE_OTHER): Payer: Medicare Other | Admitting: Cardiovascular Disease

## 2016-03-31 ENCOUNTER — Encounter: Payer: Self-pay | Admitting: Cardiovascular Disease

## 2016-03-31 ENCOUNTER — Ambulatory Visit (INDEPENDENT_AMBULATORY_CARE_PROVIDER_SITE_OTHER): Payer: Medicare Other

## 2016-03-31 VITALS — BP 110/82 | HR 66 | Ht 63.0 in | Wt 146.4 lb

## 2016-03-31 DIAGNOSIS — I4891 Unspecified atrial fibrillation: Secondary | ICD-10-CM

## 2016-03-31 DIAGNOSIS — I359 Nonrheumatic aortic valve disorder, unspecified: Secondary | ICD-10-CM | POA: Diagnosis not present

## 2016-03-31 DIAGNOSIS — I5032 Chronic diastolic (congestive) heart failure: Secondary | ICD-10-CM

## 2016-03-31 DIAGNOSIS — Z5181 Encounter for therapeutic drug level monitoring: Secondary | ICD-10-CM

## 2016-03-31 DIAGNOSIS — R110 Nausea: Secondary | ICD-10-CM | POA: Insufficient documentation

## 2016-03-31 DIAGNOSIS — Z954 Presence of other heart-valve replacement: Secondary | ICD-10-CM

## 2016-03-31 DIAGNOSIS — G459 Transient cerebral ischemic attack, unspecified: Secondary | ICD-10-CM

## 2016-03-31 DIAGNOSIS — I272 Other secondary pulmonary hypertension: Secondary | ICD-10-CM | POA: Diagnosis not present

## 2016-03-31 DIAGNOSIS — Z953 Presence of xenogenic heart valve: Secondary | ICD-10-CM

## 2016-03-31 DIAGNOSIS — E785 Hyperlipidemia, unspecified: Secondary | ICD-10-CM

## 2016-03-31 LAB — POCT INR: INR: 1.8

## 2016-03-31 NOTE — Assessment & Plan Note (Signed)
Etiology unclear, primary care is completing workup. Symptoms tolerable on Zofran, Ativan Suggested she talk to primary care about Reglan

## 2016-03-31 NOTE — Assessment & Plan Note (Signed)
No recent lipid panel available for review  currently not on a cholesterol medication   Total encounter time more than 25 minutes  Greater than 50% was spent in counseling and coordination of care with the patient

## 2016-03-31 NOTE — Assessment & Plan Note (Signed)
Weight is up compared to her prior clinic visit Daughter feels that she is eating more, less likely fluid given no change in shortness of breath, no change in clinical features. We'll continue current diuretic regiment. Extra torsemide for any symptoms

## 2016-03-31 NOTE — Assessment & Plan Note (Signed)
Minimal ankle edema on today's visit Recommended that she stay on her torsemide daily, extra doses as needed

## 2016-03-31 NOTE — Progress Notes (Signed)
Patient ID: [name], female    DOB: 04-21-1922, 82 y.o.   MRN: [id number]  HPI Comments: 82 yo WF with history of tachy-brady syndrome s/p pacemaker, Permanent atrial fibrillation on warfarin, HTN, AVR, AAA with repair 4-5 years ago, and chronic diastolic heart failure,  right brachial arterial stenosis, With TIA in 2012,  presenting for routine followup. No significant coronary artery disease or carotid arterial disease Echocardiogram in 2012 showing moderate MR and TR with moderate pulmonary hypertension She has significant peripheral vascular disease, followed by Dr. Donnetta Hutching.  She presents today for follow-up of her PAD, atrial fibrillation   in follow-up, she reports having chronic nausea She has had numerous imaging studies of her abdomen pelvis, tried various medications Now taking Zofran as needed, also lorazepam Daughter presents with her today and reports that periodically has problems with anxiety, nerves. Lorazepam does seem to help this. Weight has been relatively stable, if anything has gone up several pounds in the past month Minimal ankle edema She has been taking torsemide daily, sometimes extra pill here and there Legs are getting weaker, not as active secondary to nausea  Reports diagnosis of polymyalgia rheumatica  EKG on today's visit shows paced rhythm, rate 62 bpm  Other past medical history pneumonia in October 2015.   chronic knee pain Some shortness of breath but is not very active at baseline  History of severe COPD exacerbation, bronchitis. She was on Z-Pak, prednisone, started on albuterol inhaler She took several days of diuretics, torsemide,  with mild weight loss now down to 140 pounds. No significant edema. Symptoms improved  Previous Evaluation of her pacemaker report shows she is 60% RV paced  admitted to the hospital at [facility] on Apr 16 2011 with shortness of breath, acute on chronic diastolic heart failure, underlying hypertension.  possible   pneumonia. White blood cell count was 13. Antibiotics were discontinued after her chest x-ray cleared.  Numerous skin cancers over the past several years, on her legs, face, arms requiring resection History of arthritis in her knees requiring cortisone shot  cardiac catheterization over 5 years ago showing no significant coronary artery disease, recent CT scan showing stable descending aorta and after repair, minimal dilatation of the descending aorta, stable-appearing aortic valve.   Allergies  Allergen Reactions  . Fluticasone-Salmeterol Anaphylaxis  . Furosemide Shortness Of Breath  . Iodine Other (See Comments)    Reaction:  Fever and chills   . Iohexol Other (See Comments)    Desc:  Pt requires a 13 hr prep, when doing exam with contrast....fever, chills, dyspnea (slg/04/27/09--given 2 hour solumedrol/ benadryl prep--no reaction).    . Sulfonamide Derivatives Nausea And Vomiting  . Verapamil Nausea And Vomiting and Other (See Comments)    Reaction:  Loss of appetite   . Latex Rash  . Penicillins Rash and Other (See Comments)    Pt unable to answer additional questions about this medication.      Outpatient Encounter Prescriptions as of 03/31/2016  Medication Sig  . atenolol (TENORMIN) 100 MG tablet Take 100 mg by mouth at bedtime.   . diclofenac sodium (VOLTAREN) 1 % GEL Apply 4 g topically 2 (two) times daily.  [name] HYDROcodone-acetaminophen (NORCO/VICODIN) 5-325 MG tablet Take 1 tablet by mouth every 6 (six) hours as needed for moderate pain.  [name] latanoprost (XALATAN) 0.005 % ophthalmic solution Place 1 drop into both eyes at bedtime.  [name] LORazepam (ATIVAN) 0.5 MG tablet Take 0.5 mg by mouth 3 (three) times daily as  needed for anxiety.   . pantoprazole (PROTONIX) 40 MG tablet Take 40 mg by mouth daily.  . potassium chloride SA (K-DUR,KLOR-CON) 20 MEQ tablet Take 1 tablet (20 mEq total) by mouth every evening.  . predniSONE (DELTASONE) 20 MG tablet Take 10 mg by mouth every other  day.   . torsemide (DEMADEX) 20 MG tablet Take 20 mg by mouth daily.  . traZODone (DESYREL) 50 MG tablet Take 50 mg by mouth at bedtime.   [name] warfarin (COUMADIN) 5 MG tablet Take 2.5-5 mg by mouth every evening. Pt takes one tablet on Monday and Friday and one-half tablet all other days.  . [DISCONTINUED] clindamycin (CLEOCIN) 300 MG capsule Take 1 capsule (300 mg total) by mouth 3 (three) times daily. (Patient not taking: Reported on 03/31/2016)  . [DISCONTINUED] multivitamin-lutein (OCUVITE-LUTEIN) CAPS capsule Take 1 capsule by mouth daily. Reported on 03/31/2016  . [DISCONTINUED] torsemide (DEMADEX) 20 MG tablet TAKE 1 TABLET BY MOUTH DAILY (Patient not taking: Reported on 03/31/2016)  . [DISCONTINUED] warfarin (COUMADIN) 5 MG tablet TAKE AS DIRECTED BY ANTICOAGULATION CLINIC (Patient not taking: Reported on 03/31/2016)   No facility-administered encounter medications on file as of 03/31/2016.    Past Medical History  Diagnosis Date  . Permanent atrial fibrillation ([place])     a. Chronic coumadin.  . Tachy-brady syndrome ([place])     a. Severe in the setting of permanent atrial fibrillation with tachybrady syndrome;  b. 08/2007 s/p MDT Adapta ADSR01 DC PPM.  . S/P placement of cardiac pacemaker     a.  08/2007 s/p MDT [place] PPM.  . Brain aneurysm     Hx of  . Hypertension   . Thoracic aneurysm     a. 10/2011 CTA: 5.0 x 3.8 cm.  [name] AAA (abdominal aortic aneurysm) ([place])     a. 12/2011 U/S: 3.3 cm prox AAA.  . S/P aortic valve replacement with bioprosthetic valve     a. [facility].  [name] Aortic insufficiency     a. S/P bioprosthetic AVR;  b. 02/2013 Echo: EF 50-55%, mild to mod AI (mean grad 71mmHg).  . Chronic diastolic CHF (congestive heart failure) ([place])     a. 02/2013 Echo: EF 50-55%.  . Diverticulosis   . Hemorrhoids   . Gastritis   . Hepatitis B 1980's  . Skin cancer     face, ear  and left hand.  . Seizures ([place])   . Moderate mitral regurgitation     a. 02/2013 Echo: Mod  MR.  . Moderate tricuspid regurgitation     a. 02/2013 Echo: Mod TR.  [name] Pulmonary hypertension ([place])     a. 02/2013 Echo: PASP 45mmHg.  . Chest pain     a. H/o nonobs cath;  b. 07/2013 Lexiscan MV: EF 66%, no ischemia.  . Shingles     Past Surgical History  Procedure Laterality Date  . Cerebral aneurysm repair    . Abdominal aortic aneurysm repair      Resection  . Aortic valve replacement      With bioprosthetic valve  . Pacemaker placement  08/2007    [employer]; AF permanent; s/p pacemaker for bradycardia with now well controlled ventricular response; Digoxin therapy, question level; Lower extremity venous issues  . Breast biopsy    . Cholecystectomy    . Vesicovaginal fistula closure w/ tah    . Appendectomy    . Lung biopsy  2005  . Skin cancer removal      ear, face and  hand    Social History  reports that she has never smoked. She does not have any smokeless tobacco history on file. She reports that she does not drink alcohol or use illicit drugs.  Family History family history includes AAA (abdominal aortic aneurysm) in her sister; Cancer in her mother; Heart disease in her brother, father, mother, and sister; Hypertension in her brother, father, mother, and sister; Varicose Veins in her mother and sister.   Review of Systems  Constitutional: Negative.   Respiratory: Negative.   Cardiovascular: Negative.   Gastrointestinal: Positive for nausea.  Musculoskeletal: Positive for arthralgias and gait problem.  Skin: Negative.        Healing wound on the top of her left foot, painful to touch  Neurological: Negative.   Hematological: Negative.   Psychiatric/Behavioral: Negative.   All other systems reviewed and are negative.   BP 110/82 mmHg  Pulse 66  Ht 5\' 3"  (1.6 m)  Wt 146 lb 6 oz (66.395 kg)  BMI 25.94 kg/m2  Physical Exam  Constitutional: She is oriented to person, place, and time. She appears well-developed and well-nourished.  Presenting today in a  wheelchair  HENT:  Head: Normocephalic.  Nose: Nose normal.  Mouth/Throat: Oropharynx is clear and moist.  Eyes: Conjunctivae are normal. Pupils are equal, round, and reactive to light.  Neck: Normal range of motion. Neck supple. No JVD present.  Cardiovascular: Normal rate, S1 normal, S2 normal and intact distal pulses.  Exam reveals no gallop and no friction rub.   No murmur heard. Pulmonary/Chest: Effort normal. No respiratory distress. She has decreased breath sounds. She has no wheezes. She has no rales. She exhibits no tenderness.  Abdominal: Soft. Bowel sounds are normal. She exhibits no distension. There is no tenderness.  Musculoskeletal: Normal range of motion. She exhibits no edema or tenderness.  Lymphadenopathy:    She has no cervical adenopathy.  Neurological: She is alert and oriented to person, place, and time. Coordination normal.  Skin: Skin is warm and dry. No rash noted. No erythema.  Psychiatric: She has a normal mood and affect. Her behavior is normal. Judgment and thought content normal.    Assessment and Plan  Nursing note and vitals reviewed.

## 2016-03-31 NOTE — Assessment & Plan Note (Signed)
Well-functioning valve by echocardiogram April 2016 We'll repeat in 2018

## 2016-03-31 NOTE — Patient Instructions (Signed)
You are doing well. No medication changes were made.  Ask primary care about reglan for motility  Please call us if you have new issues that need to be addressed before your next appt.  Your physician wants you to follow-up in: 6 months.  You will receive a reminder letter in the mail two months in advance. If you don't receive a letter, please call our office to schedule the follow-up appointment.

## 2016-03-31 NOTE — Assessment & Plan Note (Signed)
Paced rhythm, on anticoagulation 

## 2016-04-01 ENCOUNTER — Ambulatory Visit
Admission: RE | Admit: 2016-04-01 | Discharge: 2016-04-01 | Disposition: A | Discharge status: Home / Self Care | Payer: Medicare Other | Source: Ambulatory Visit | Attending: Student | Admitting: Student

## 2016-04-01 DIAGNOSIS — K449 Diaphragmatic hernia without obstruction or gangrene: Secondary | ICD-10-CM | POA: Insufficient documentation

## 2016-04-01 DIAGNOSIS — R1310 Dysphagia, unspecified: Secondary | ICD-10-CM | POA: Diagnosis present

## 2016-04-16 ENCOUNTER — Ambulatory Visit (INDEPENDENT_AMBULATORY_CARE_PROVIDER_SITE_OTHER): Payer: Medicare Other | Admitting: *Deleted

## 2016-04-16 DIAGNOSIS — I495 Sick sinus syndrome: Secondary | ICD-10-CM

## 2016-04-16 NOTE — Progress Notes (Signed)
Remote pacemaker transmission.   

## 2016-04-23 ENCOUNTER — Ambulatory Visit (INDEPENDENT_AMBULATORY_CARE_PROVIDER_SITE_OTHER): Payer: Medicare Other

## 2016-04-23 ENCOUNTER — Other Ambulatory Visit: Payer: Self-pay | Admitting: Cardiovascular Disease

## 2016-04-23 DIAGNOSIS — Z5181 Encounter for therapeutic drug level monitoring: Secondary | ICD-10-CM | POA: Diagnosis not present

## 2016-04-23 DIAGNOSIS — G459 Transient cerebral ischemic attack, unspecified: Secondary | ICD-10-CM | POA: Diagnosis not present

## 2016-04-23 DIAGNOSIS — I4891 Unspecified atrial fibrillation: Secondary | ICD-10-CM | POA: Diagnosis not present

## 2016-04-23 DIAGNOSIS — Z954 Presence of other heart-valve replacement: Secondary | ICD-10-CM | POA: Diagnosis not present

## 2016-04-23 DIAGNOSIS — I359 Nonrheumatic aortic valve disorder, unspecified: Secondary | ICD-10-CM | POA: Diagnosis not present

## 2016-04-23 DIAGNOSIS — Z953 Presence of xenogenic heart valve: Secondary | ICD-10-CM

## 2016-04-23 LAB — POCT INR: INR: 2.3

## 2016-05-01 LAB — CUP PACEART REMOTE DEVICE CHECK
Battery Remaining Longevity: 8 mo
Battery Voltage: 2.65 V
Brady Statistic RV Percent Paced: 99 %
Date Time Interrogation Session: 20170531125205
Implantable Lead Location: 753860
Lead Channel Impedance Value: 0 Ohm
MDC IDC LEAD IMPLANT DT: 20081003
MDC IDC MSMT BATTERY IMPEDANCE: 5350 Ohm
MDC IDC MSMT LEADCHNL RV IMPEDANCE VALUE: 621 Ohm
MDC IDC SET LEADCHNL RV PACING AMPLITUDE: 2 V
MDC IDC SET LEADCHNL RV PACING PULSEWIDTH: 0.4 ms
MDC IDC SET LEADCHNL RV SENSING SENSITIVITY: 5.6 mV

## 2016-05-07 ENCOUNTER — Encounter: Payer: Self-pay | Admitting: Cardiology

## 2016-05-10 ENCOUNTER — Emergency Department (HOSPITAL_BASED_OUTPATIENT_CLINIC_OR_DEPARTMENT_OTHER)
Admit: 2016-05-10 | Discharge: 2016-05-10 | Disposition: A | Discharge status: Home / Self Care | Payer: Medicare Other | Attending: Emergency Medicine | Admitting: Emergency Medicine

## 2016-05-10 ENCOUNTER — Encounter (HOSPITAL_COMMUNITY): Payer: Self-pay | Admitting: *Deleted

## 2016-05-10 ENCOUNTER — Emergency Department (HOSPITAL_COMMUNITY): Payer: Medicare Other

## 2016-05-10 ENCOUNTER — Other Ambulatory Visit: Payer: Self-pay

## 2016-05-10 ENCOUNTER — Inpatient Hospital Stay (HOSPITAL_COMMUNITY)
Admission: EM | Admit: 2016-05-10 | Discharge: 2016-05-14 | DRG: 291 | Disposition: A | Discharge status: Home Health | Payer: Medicare Other | Attending: Internal Medicine | Admitting: Internal Medicine

## 2016-05-10 DIAGNOSIS — M7989 Other specified soft tissue disorders: Secondary | ICD-10-CM | POA: Diagnosis not present

## 2016-05-10 DIAGNOSIS — R1310 Dysphagia, unspecified: Secondary | ICD-10-CM | POA: Diagnosis present

## 2016-05-10 DIAGNOSIS — Z7901 Long term (current) use of anticoagulants: Secondary | ICD-10-CM

## 2016-05-10 DIAGNOSIS — Z8679 Personal history of other diseases of the circulatory system: Secondary | ICD-10-CM

## 2016-05-10 DIAGNOSIS — I110 Hypertensive heart disease with heart failure: Secondary | ICD-10-CM | POA: Diagnosis not present

## 2016-05-10 DIAGNOSIS — J9600 Acute respiratory failure, unspecified whether with hypoxia or hypercapnia: Secondary | ICD-10-CM

## 2016-05-10 DIAGNOSIS — I5043 Acute on chronic combined systolic (congestive) and diastolic (congestive) heart failure: Secondary | ICD-10-CM | POA: Diagnosis present

## 2016-05-10 DIAGNOSIS — I482 Chronic atrial fibrillation: Secondary | ICD-10-CM | POA: Diagnosis not present

## 2016-05-10 DIAGNOSIS — F419 Anxiety disorder, unspecified: Secondary | ICD-10-CM | POA: Diagnosis present

## 2016-05-10 DIAGNOSIS — I5031 Acute diastolic (congestive) heart failure: Secondary | ICD-10-CM | POA: Diagnosis not present

## 2016-05-10 DIAGNOSIS — G8929 Other chronic pain: Secondary | ICD-10-CM | POA: Diagnosis present

## 2016-05-10 DIAGNOSIS — I4891 Unspecified atrial fibrillation: Secondary | ICD-10-CM | POA: Diagnosis present

## 2016-05-10 DIAGNOSIS — Z882 Allergy status to sulfonamides status: Secondary | ICD-10-CM

## 2016-05-10 DIAGNOSIS — Z85828 Personal history of other malignant neoplasm of skin: Secondary | ICD-10-CM

## 2016-05-10 DIAGNOSIS — I5033 Acute on chronic diastolic (congestive) heart failure: Secondary | ICD-10-CM | POA: Insufficient documentation

## 2016-05-10 DIAGNOSIS — Z7952 Long term (current) use of systemic steroids: Secondary | ICD-10-CM

## 2016-05-10 DIAGNOSIS — G4700 Insomnia, unspecified: Secondary | ICD-10-CM

## 2016-05-10 DIAGNOSIS — J9601 Acute respiratory failure with hypoxia: Secondary | ICD-10-CM | POA: Diagnosis not present

## 2016-05-10 DIAGNOSIS — Z953 Presence of xenogenic heart valve: Secondary | ICD-10-CM

## 2016-05-10 DIAGNOSIS — R0602 Shortness of breath: Secondary | ICD-10-CM | POA: Diagnosis not present

## 2016-05-10 DIAGNOSIS — B379 Candidiasis, unspecified: Secondary | ICD-10-CM | POA: Diagnosis present

## 2016-05-10 DIAGNOSIS — Z950 Presence of cardiac pacemaker: Secondary | ICD-10-CM

## 2016-05-10 DIAGNOSIS — I272 Other secondary pulmonary hypertension: Secondary | ICD-10-CM | POA: Diagnosis present

## 2016-05-10 DIAGNOSIS — I509 Heart failure, unspecified: Secondary | ICD-10-CM

## 2016-05-10 DIAGNOSIS — R0902 Hypoxemia: Secondary | ICD-10-CM

## 2016-05-10 DIAGNOSIS — M069 Rheumatoid arthritis, unspecified: Secondary | ICD-10-CM | POA: Diagnosis present

## 2016-05-10 DIAGNOSIS — I5021 Acute systolic (congestive) heart failure: Secondary | ICD-10-CM

## 2016-05-10 DIAGNOSIS — Z8249 Family history of ischemic heart disease and other diseases of the circulatory system: Secondary | ICD-10-CM

## 2016-05-10 DIAGNOSIS — E785 Hyperlipidemia, unspecified: Secondary | ICD-10-CM | POA: Diagnosis present

## 2016-05-10 LAB — COMPREHENSIVE METABOLIC PANEL
ALBUMIN: 3.7 g/dL (ref 3.5–5.0)
ALT: 19 U/L (ref 14–54)
AST: 34 U/L (ref 15–41)
Alkaline Phosphatase: 74 U/L (ref 38–126)
Anion gap: 8 (ref 5–15)
BUN: 31 mg/dL — AB (ref 6–20)
CHLORIDE: 102 mmol/L (ref 101–111)
CO2: 32 mmol/L (ref 22–32)
Calcium: 9 mg/dL (ref 8.9–10.3)
Creatinine, Ser: 1.05 mg/dL — ABNORMAL HIGH (ref 0.44–1.00)
GFR calc Af Amer: 52 mL/min — ABNORMAL LOW (ref >60)
GFR, EST NON AFRICAN AMERICAN: 45 mL/min — AB (ref >60)
Glucose, Bld: 141 mg/dL — ABNORMAL HIGH (ref 65–99)
POTASSIUM: 4.6 mmol/L (ref 3.5–5.1)
SODIUM: 142 mmol/L (ref 135–145)
Total Bilirubin: 1.7 mg/dL — ABNORMAL HIGH (ref 0.3–1.2)
Total Protein: 6.6 g/dL (ref 6.5–8.1)

## 2016-05-10 LAB — CBC WITH DIFFERENTIAL/PLATELET
BASOS ABS: 0 10*3/uL (ref 0.0–0.1)
Basophils Relative: 0 %
EOS ABS: 0 10*3/uL (ref 0.0–0.7)
Eosinophils Relative: 0 %
HCT: 40.9 % (ref 36.0–46.0)
Hemoglobin: 13.4 g/dL (ref 12.0–15.0)
LYMPHS ABS: 1.1 10*3/uL (ref 0.7–4.0)
Lymphocytes Relative: 17 %
MCH: 30 pg (ref 26.0–34.0)
MCHC: 32.8 g/dL (ref 30.0–36.0)
MCV: 91.7 fL (ref 78.0–100.0)
MONO ABS: 0.1 10*3/uL (ref 0.1–1.0)
Monocytes Relative: 2 %
NEUTROS PCT: 81 %
Neutro Abs: 5.4 10*3/uL (ref 1.7–7.7)
PLATELETS: 111 10*3/uL — AB (ref 150–400)
RBC: 4.46 MIL/uL (ref 3.87–5.11)
RDW: 14.2 % (ref 11.5–15.5)
WBC: 6.6 10*3/uL (ref 4.0–10.5)

## 2016-05-10 LAB — PROTIME-INR
INR: 1.67 — AB (ref 0.00–1.49)
Prothrombin Time: 19.7 seconds — ABNORMAL HIGH (ref 11.6–15.2)

## 2016-05-10 LAB — I-STAT TROPONIN, ED: TROPONIN I, POC: 0.03 ng/mL (ref 0.00–0.08)

## 2016-05-10 LAB — D-DIMER, QUANTITATIVE (NOT AT ARMC): D DIMER QUANT: 1.87 ug{FEU}/mL — AB (ref 0.00–0.50)

## 2016-05-10 LAB — BRAIN NATRIURETIC PEPTIDE: B NATRIURETIC PEPTIDE 5: 216.8 pg/mL — AB (ref 0.0–100.0)

## 2016-05-10 MED ORDER — MECLIZINE HCL 25 MG PO TABS
12.5000 mg | ORAL_TABLET | Freq: Three times a day (TID) | ORAL | Status: DC | PRN
  Administered 2016-05-13: 12.5 mg via ORAL
  Filled 2016-05-10: qty 1

## 2016-05-10 MED ORDER — WARFARIN - PHARMACIST DOSING INPATIENT: Freq: Every day | Status: DC

## 2016-05-10 MED ORDER — DICLOFENAC SODIUM 1 % TD GEL
4.0000 g | Freq: Two times a day (BID) | TRANSDERMAL | Status: DC | PRN
  Administered 2016-05-12: 4 g via TOPICAL
  Filled 2016-05-10: qty 100

## 2016-05-10 MED ORDER — ONDANSETRON HCL 4 MG/2ML IJ SOLN: 4.0000 mg | Freq: Four times a day (QID) | INTRAMUSCULAR | Status: DC | PRN

## 2016-05-10 MED ORDER — LORAZEPAM 0.5 MG PO TABS
0.5000 mg | ORAL_TABLET | Freq: Three times a day (TID) | ORAL | Status: DC
  Administered 2016-05-10 – 2016-05-14 (×12): 0.5 mg via ORAL
  Filled 2016-05-10 (×13): qty 1

## 2016-05-10 MED ORDER — ENOXAPARIN SODIUM 80 MG/0.8ML ~~LOC~~ SOLN
65.0000 mg | Freq: Two times a day (BID) | SUBCUTANEOUS | Status: DC
  Administered 2016-05-10 – 2016-05-12 (×4): 65 mg via SUBCUTANEOUS
  Filled 2016-05-10 (×4): qty 0.8

## 2016-05-10 MED ORDER — ATENOLOL 100 MG PO TABS
100.0000 mg | ORAL_TABLET | Freq: Every day | ORAL | Status: DC
  Administered 2016-05-10 – 2016-05-12 (×3): 100 mg via ORAL
  Filled 2016-05-10: qty 2
  Filled 2016-05-10 (×2): qty 1
  Filled 2016-05-10 (×2): qty 2
  Filled 2016-05-10: qty 1

## 2016-05-10 MED ORDER — ONDANSETRON HCL 4 MG PO TABS: 4.0000 mg | ORAL_TABLET | Freq: Four times a day (QID) | ORAL | Status: DC | PRN

## 2016-05-10 MED ORDER — WARFARIN SODIUM 5 MG PO TABS
5.0000 mg | ORAL_TABLET | Freq: Once | ORAL | Status: AC
Start: 2016-05-10 — End: 2016-05-10
  Administered 2016-05-10: 5 mg via ORAL
  Filled 2016-05-10: qty 1

## 2016-05-10 MED ORDER — POTASSIUM CHLORIDE CRYS ER 20 MEQ PO TBCR
20.0000 meq | EXTENDED_RELEASE_TABLET | Freq: Every evening | ORAL | Status: DC
  Administered 2016-05-10 – 2016-05-13 (×4): 20 meq via ORAL
  Filled 2016-05-10 (×4): qty 1

## 2016-05-10 MED ORDER — PREDNISONE 5 MG PO TABS
7.5000 mg | ORAL_TABLET | ORAL | Status: DC
  Administered 2016-05-11 – 2016-05-13 (×2): 7.5 mg via ORAL
  Filled 2016-05-10 (×3): qty 1

## 2016-05-10 MED ORDER — SODIUM CHLORIDE 0.9 % IV SOLN: 250.0000 mL | INTRAVENOUS | Status: DC | PRN

## 2016-05-10 MED ORDER — SODIUM CHLORIDE 0.9% FLUSH
3.0000 mL | Freq: Two times a day (BID) | INTRAVENOUS | Status: DC
  Administered 2016-05-10 – 2016-05-14 (×8): 3 mL via INTRAVENOUS

## 2016-05-10 MED ORDER — ALBUTEROL SULFATE (2.5 MG/3ML) 0.083% IN NEBU
5.0000 mg | INHALATION_SOLUTION | Freq: Once | RESPIRATORY_TRACT | Status: AC
  Administered 2016-05-10: 5 mg via RESPIRATORY_TRACT
  Filled 2016-05-10: qty 6

## 2016-05-10 MED ORDER — LATANOPROST 0.005 % OP SOLN
1.0000 [drp] | Freq: Every day | OPHTHALMIC | Status: DC
  Administered 2016-05-10 – 2016-05-13 (×4): 1 [drp] via OPHTHALMIC
  Filled 2016-05-10: qty 2.5

## 2016-05-10 MED ORDER — HYDROCODONE-ACETAMINOPHEN 5-325 MG PO TABS
1.0000 | ORAL_TABLET | Freq: Four times a day (QID) | ORAL | Status: DC | PRN
  Filled 2016-05-10: qty 1

## 2016-05-10 MED ORDER — SODIUM CHLORIDE 0.9% FLUSH
3.0000 mL | Freq: Two times a day (BID) | INTRAVENOUS | Status: DC
  Administered 2016-05-12: 3 mL via INTRAVENOUS

## 2016-05-10 MED ORDER — HEPARIN SODIUM (PORCINE) 5000 UNIT/ML IJ SOLN: 5000.0000 [IU] | Freq: Three times a day (TID) | INTRAMUSCULAR | Status: DC

## 2016-05-10 MED ORDER — PANTOPRAZOLE SODIUM 40 MG PO TBEC
40.0000 mg | DELAYED_RELEASE_TABLET | Freq: Every day | ORAL | Status: DC
  Administered 2016-05-11 – 2016-05-14 (×4): 40 mg via ORAL
  Filled 2016-05-10 (×4): qty 1

## 2016-05-10 MED ORDER — TORSEMIDE 20 MG PO TABS
40.0000 mg | ORAL_TABLET | Freq: Every day | ORAL | Status: DC
  Filled 2016-05-10: qty 2

## 2016-05-10 MED ORDER — TRAZODONE HCL 50 MG PO TABS
50.0000 mg | ORAL_TABLET | Freq: Every evening | ORAL | Status: DC | PRN
  Administered 2016-05-11 – 2016-05-13 (×3): 50 mg via ORAL
  Filled 2016-05-10 (×3): qty 1

## 2016-05-10 MED ORDER — PREDNISONE 50 MG PO TABS
50.0000 mg | ORAL_TABLET | Freq: Once | ORAL | Status: AC
  Administered 2016-05-10: 50 mg via ORAL
  Filled 2016-05-10: qty 1

## 2016-05-10 MED ORDER — SODIUM CHLORIDE 0.9% FLUSH: 3.0000 mL | INTRAVENOUS | Status: DC | PRN

## 2016-05-10 MED ORDER — MOMETASONE FURO-FORMOTEROL FUM 100-5 MCG/ACT IN AERO
2.0000 | INHALATION_SPRAY | Freq: Two times a day (BID) | RESPIRATORY_TRACT | Status: DC
  Administered 2016-05-10 – 2016-05-14 (×7): 2 via RESPIRATORY_TRACT
  Filled 2016-05-10: qty 8.8

## 2016-05-10 NOTE — ED Notes (Signed)
Pt's oxygen on RA was 86%. This RN turned back on O2 at 2L

## 2016-05-10 NOTE — Progress Notes (Signed)
ANTICOAGULATION CONSULT NOTE - Initial Consult  Pharmacy Consult for Warfarin/Lovenox Indication: Afib, AVR  Allergies  Allergen Reactions  . Fluticasone-Salmeterol Anaphylaxis  . Furosemide Shortness Of Breath  . Iodine Other (See Comments)    Reaction:  Fever and chills   . Iohexol Other (See Comments)    Desc:  Pt requires a 13 hr prep, when doing exam with contrast....fever, chills, dyspnea (slg/04/27/09--given 2 hour solumedrol/ benadryl prep--no reaction).    . Sulfonamide Derivatives Nausea And Vomiting  . Verapamil Nausea And Vomiting and Other (See Comments)    Reaction:  Loss of appetite   . Latex Rash  . Penicillins Rash and Other (See Comments)    Pt unable to answer additional questions about this medication.   Has patient had a PCN reaction causing immediate rash, facial/tongue/throat swelling, SOB or lightheadedness with hypotension: Yes Has patient had a PCN reaction causing severe rash involving mucus membranes or skin necrosis: UnknownHas patient had a PCN reaction that required hospitalization NoHas patient had a PCN reaction occurring within the last 10 years: No If all of the above answers are "NO", then may proceed with Cephalosporin    Patient Measurements:    Vital Signs: Temp: 97.9 F (36.6 C) (06/24 1902) Temp Source: Oral (06/24 1902) BP: 158/85 mmHg (06/24 1902) Pulse Rate: 76 (06/24 1902)  Labs:  Recent Labs  05/10/16 1331  HGB 13.4  HCT 40.9  PLT 111*  LABPROT 19.7*  INR 1.67*  CREATININE 1.05*    CrCl cannot be calculated (Unknown ideal weight.).   Medical History: Past Medical History  Diagnosis Date  . Permanent atrial fibrillation ([place])     a. Chronic coumadin.  . Tachy-brady syndrome ([place])     a. Severe in the setting of permanent atrial fibrillation with tachybrady syndrome;  b. 08/2007 s/p MDT Adapta ADSR01 DC PPM.  . S/P placement of cardiac pacemaker     a.  08/2007 s/p MDT [place] PPM.  . Brain aneurysm     Hx  of  . Hypertension   . Thoracic aneurysm     a. 10/2011 CTA: 5.0 x 3.8 cm.  [name] AAA (abdominal aortic aneurysm) ([place])     a. 12/2011 U/S: 3.3 cm prox AAA.  . S/P aortic valve replacement with bioprosthetic valve     a. [facility].  [name] Aortic insufficiency     a. S/P bioprosthetic AVR;  b. 02/2013 Echo: EF 50-55%, mild to mod AI (mean grad 67mmHg).  . Chronic diastolic CHF (congestive heart failure) ([place])     a. 02/2013 Echo: EF 50-55%.  . Diverticulosis   . Hemorrhoids   . Gastritis   . Hepatitis B 1980's  . Skin cancer     face, ear  and left hand.  . Seizures ([place])   . Moderate mitral regurgitation     a. 02/2013 Echo: Mod MR.  . Moderate tricuspid regurgitation     a. 02/2013 Echo: Mod TR.  [name] Pulmonary hypertension ([place])     a. 02/2013 Echo: PASP 75mmHg.  . Chest pain     a. H/o nonobs cath;  b. 07/2013 Lexiscan MV: EF 66%, no ischemia.  . Shingles     Medications:  Prescriptions prior to admission  Medication Sig Dispense Refill Last Dose  . albuterol (PROVENTIL HFA;VENTOLIN HFA) 108 (90 Base) MCG/ACT inhaler Inhale 1 puff into the lungs every 6 (six) hours as needed for wheezing or shortness of breath.   05/10/2016 at 1130  . atenolol (  TENORMIN) 100 MG tablet Take 100 mg by mouth at bedtime.    05/09/2016 at 2130  . budesonide-formoterol (SYMBICORT) 80-4.5 MCG/ACT inhaler Inhale 2 puffs into the lungs 2 (two) times daily.   05/10/2016 at 0530  . diclofenac sodium (VOLTAREN) 1 % GEL Apply 4 g topically 2 (two) times daily.   05/09/2016 at Unknown time  . HYDROcodone-acetaminophen (NORCO/VICODIN) 5-325 MG tablet Take 1 tablet by mouth every 6 (six) hours as needed for moderate pain.   over a month ago at Unknown time  . latanoprost (XALATAN) 0.005 % ophthalmic solution Place 1 drop into both eyes at bedtime.   05/09/2016 at Unknown time  . LORazepam (ATIVAN) 0.5 MG tablet Take 0.5 mg by mouth 3 (three) times daily. Scheduled; not PRN.   05/10/2016 at am  . meclizine (ANTIVERT) 25 MG  tablet Take 12.5 mg by mouth 3 (three) times daily as needed for dizziness.   Past Month at Unknown time  . ondansetron (ZOFRAN-ODT) 4 MG disintegrating tablet Take 4 mg by mouth every 8 (eight) hours as needed for nausea or vomiting.   05/10/2016 at 1100  . pantoprazole (PROTONIX) 40 MG tablet Take 40 mg by mouth daily.   05/10/2016 at Unknown time  . potassium chloride SA (K-DUR,KLOR-CON) 20 MEQ tablet Take 1 tablet (20 mEq total) by mouth every evening. 90 tablet 3 05/09/2016 at Unknown time  . predniSONE (DELTASONE) 10 MG tablet Take by mouth See admin instructions. Take 5 tablets daily for 2 days, 4 tabs daily for 2 days, 3 tabs daily for 2 days, 2 tabs daily for 2 days, then 1 tab daily for 2 days.   05/10/2016 at Unknown time  . torsemide (DEMADEX) 20 MG tablet Take 20 mg by mouth daily.   05/10/2016 at Unknown time  . traZODone (DESYREL) 50 MG tablet Take 50-100 mg by mouth at bedtime as needed for sleep.    05/09/2016 at Unknown time  . warfarin (COUMADIN) 5 MG tablet Take 2.5-5 mg by mouth every evening. Take one tablet (5mg ) on Monday and Friday and one-half tablet (2.5mg ) on Tuesday, Wednesday, Thursday, Saturday, and Sunday.   05/09/2016 at 1800  . predniSONE (DELTASONE) 5 MG tablet Take 7.5 mg by mouth every other day. Polymyalgia rheumatica.     [name] torsemide (DEMADEX) 20 MG tablet TAKE 1 TABLET BY MOUTH DAILY (Patient not taking: Reported on 05/10/2016) 30 tablet 6    Scheduled:  . atenolol  100 mg Oral QHS  . latanoprost  1 drop Both Eyes QHS  . LORazepam  0.5 mg Oral TID  . mometasone-formoterol  2 puff Inhalation BID  . [START ON 05/11/2016] pantoprazole  40 mg Oral Daily  . potassium chloride SA  20 mEq Oral QPM  . [START ON 05/11/2016] predniSONE  7.5 mg Oral QODAY  . sodium chloride flush  3 mL Intravenous Q12H  . sodium chloride flush  3 mL Intravenous Q12H  . [START ON 05/11/2016] torsemide  40 mg Oral Daily    Assessment: 82 y.o. female with medical history significant of  fibrillation, tachybradycardia syndrome, status post cardiac pacemaker, brain aneurysm status post clipping, AAA, aortic valve replacement, chronic diastolic CHF, hepatitis B, postanesthesia seizure, presented with three-week history of shortness of breath. Admitted for CHF +/- asthma exacerbation.   Baseline INR: low  Prior anticoagulation: warfarin 2.5 mg daily except 5 mg on Mon, Fri; LD 6/23  Significant events:  Today, 05/10/2016:  CBC: Hgb wnl; Plt low  Major drug interactions: none  No bleeding issues per nursing  Heart diet ordered  Goal of Therapy: INR 2.5-3 LMWH Anti-Xa level 0.6-1 drawn 4 hrs after dose  Plan:  Stop SQ heparin  Lovenox 65 mg SQ q12 hr  Warfarin 5 mg PO x1 tonight  Daily INR  CBC at least q72 hr while on warfarin  Monitor for signs of bleeding or thrombosis   Reuel Boom, PharmD Pager: [phone] 05/10/2016, 7:42 PM

## 2016-05-10 NOTE — ED Notes (Signed)
Pt can go up to floor at 18:10.

## 2016-05-10 NOTE — ED Notes (Signed)
Bed: WA02 Expected date:  Expected time:  Means of arrival:  Comments: 82 yo recent PNA dx

## 2016-05-10 NOTE — H&P (Signed)
History and Physical    [name] [id number] DOB: 04-14-1922 DOA: 05/10/2016  PCP: [name], MD Patient coming from: [facility]  Chief Complaint: SOB  HPI: [name] is a 82 y.o. female with medical history significant of fibrillation, tachybradycardia syndrome, status post cardiac pacemaker, brain aneurysm status post clipping, AAA, aortic valve replacement, chronic diastolic CHF, hepatitis B, postanesthesia seizure, presented with three-week history of shortness of breath. Initially treated for pneumonia by PCP with only minimal improvement in symptoms. Shortness of breath became significantly worse over the last 3 days. Associated with lower extremity swelling bilaterally, saw her PCP 2 days ago put her on steroids, prednisone 50 mg daily and nebulizer treatments without any appreciable improvement in symptoms. Denies fevers, neck stiffness, headache, nausea, vomiting, diarrhea, abdominal pain, chest pain, palpitations. Patient only has intermittent occasional dry cough. The treatment in the ED with improvement in symptoms. Patient also endorses a 10 pound weight gain over the last several weeks.  ED Course: Patient noted be hypoxic and given oxygen and prednisone and ebulizer treatments with improvement.  Review of Systems: As per HPI otherwise 10 point review of systems negative.   Ambulatory Status:slow and purposeful  Past Medical History  Diagnosis Date  . Permanent atrial fibrillation ([place])     a. Chronic coumadin.  . Tachy-brady syndrome ([place])     a. Severe in the setting of permanent atrial fibrillation with tachybrady syndrome;  b. 08/2007 s/p MDT [place] ADSR01 DC PPM.  . S/P placement of cardiac pacemaker     a.  08/2007 s/p MDT [place] PPM.  . Brain aneurysm     Hx of  . Hypertension   . Thoracic aneurysm     a. 10/2011 CTA: 5.0 x 3.8 cm.  [name] AAA (abdominal aortic aneurysm) ([place])     a. 12/2011 U/S: 3.3 cm prox AAA.  . S/P aortic valve  replacement with bioprosthetic valve     a. [facility].  [name] Aortic insufficiency     a. S/P bioprosthetic AVR;  b. 02/2013 Echo: EF 50-55%, mild to mod AI (mean grad 45mmHg).  . Chronic diastolic CHF (congestive heart failure) ([place])     a. 02/2013 Echo: EF 50-55%.  . Diverticulosis   . Hemorrhoids   . Gastritis   . Hepatitis B 1980's  . Skin cancer     face, ear  and left hand.  . Seizures ([place])   . Moderate mitral regurgitation     a. 02/2013 Echo: Mod MR.  . Moderate tricuspid regurgitation     a. 02/2013 Echo: Mod TR.  [name] Pulmonary hypertension ([place])     a. 02/2013 Echo: PASP 60mmHg.  . Chest pain     a. H/o nonobs cath;  b. 07/2013 Lexiscan MV: EF 66%, no ischemia.  . Shingles     Past Surgical History  Procedure Laterality Date  . Cerebral aneurysm repair    . Abdominal aortic aneurysm repair      Resection  . Aortic valve replacement      With bioprosthetic valve  . Pacemaker placement  08/2007    [employer]; AF permanent; s/p pacemaker for bradycardia with now well controlled ventricular response; Digoxin therapy, question level; Lower extremity venous issues  . Breast biopsy    . Cholecystectomy    . Vesicovaginal fistula closure w/ tah    . Appendectomy    . Lung biopsy  2005  . Skin cancer removal      ear, face  and hand    Social History   Social History  . Marital Status: Widowed    Spouse Name: N/A  . Number of Children: N/A  . Years of Education: N/A   Occupational History  . Retired    Social History Main Topics  . Smoking status: Never Smoker   . Smokeless tobacco: Not on file  . Alcohol Use: No  . Drug Use: No  . Sexual Activity: Not on file   Other Topics Concern  . Not on file   Social History Narrative   Married    Allergies  Allergen Reactions  . Fluticasone-Salmeterol Anaphylaxis  . Furosemide Shortness Of Breath  . Iodine Other (See Comments)    Reaction:  Fever and chills   . Iohexol Other (See Comments)    Desc:  Pt  requires a 13 hr prep, when doing exam with contrast....fever, chills, dyspnea (slg/04/27/09--given 2 hour solumedrol/ benadryl prep--no reaction).    . Sulfonamide Derivatives Nausea And Vomiting  . Verapamil Nausea And Vomiting and Other (See Comments)    Reaction:  Loss of appetite   . Latex Rash  . Penicillins Rash and Other (See Comments)    Pt unable to answer additional questions about this medication.   Has patient had a PCN reaction causing immediate rash, facial/tongue/throat swelling, SOB or lightheadedness with hypotension: Yes Has patient had a PCN reaction causing severe rash involving mucus membranes or skin necrosis: UnknownHas patient had a PCN reaction that required hospitalization NoHas patient had a PCN reaction occurring within the last 10 years: No If all of the above answers are "NO", then may proceed with Cephalosporin    Family History  Problem Relation Age of Onset  . Cancer Mother   . Heart disease Mother   . Hypertension Mother   . Varicose Veins Mother   . Heart disease Father   . Hypertension Father   . Heart disease Sister   . Hypertension Sister   . Varicose Veins Sister   . AAA (abdominal aortic aneurysm) Sister   . Heart disease Brother   . Hypertension Brother     Prior to Admission medications   Medication Sig Start Date End Date Taking? Authorizing Provider  albuterol (PROVENTIL HFA;VENTOLIN HFA) 108 (90 Base) MCG/ACT inhaler Inhale 1 puff into the lungs every 6 (six) hours as needed for wheezing or shortness of breath.   Yes Historical Provider, MD  atenolol (TENORMIN) 100 MG tablet Take 100 mg by mouth at bedtime.    Yes Historical Provider, MD  budesonide-formoterol (SYMBICORT) 80-4.5 MCG/ACT inhaler Inhale 2 puffs into the lungs 2 (two) times daily.   Yes Historical Provider, MD  diclofenac sodium (VOLTAREN) 1 % GEL Apply 4 g topically 2 (two) times daily.   Yes Historical Provider, MD  HYDROcodone-acetaminophen (NORCO/VICODIN) 5-325 MG tablet  Take 1 tablet by mouth every 6 (six) hours as needed for moderate pain.   Yes Historical Provider, MD  latanoprost (XALATAN) 0.005 % ophthalmic solution Place 1 drop into both eyes at bedtime.   Yes Historical Provider, MD  LORazepam (ATIVAN) 0.5 MG tablet Take 0.5 mg by mouth 3 (three) times daily. Scheduled; not PRN.   Yes Historical Provider, MD  meclizine (ANTIVERT) 25 MG tablet Take 12.5 mg by mouth 3 (three) times daily as needed for dizziness.   Yes Historical Provider, MD  ondansetron (ZOFRAN-ODT) 4 MG disintegrating tablet Take 4 mg by mouth every 8 (eight) hours as needed for nausea or vomiting.   Yes Historical  Provider, MD  pantoprazole (PROTONIX) 40 MG tablet Take 40 mg by mouth daily.   Yes Historical Provider, MD  potassium chloride SA (K-DUR,KLOR-CON) 20 MEQ tablet Take 1 tablet (20 mEq total) by mouth every evening. 12/07/15  Yes Minna Merritts, MD  predniSONE (DELTASONE) 10 MG tablet Take by mouth See admin instructions. Take 5 tablets daily for 2 days, 4 tabs daily for 2 days, 3 tabs daily for 2 days, 2 tabs daily for 2 days, then 1 tab daily for 2 days.   Yes Historical Provider, MD  torsemide (DEMADEX) 20 MG tablet Take 20 mg by mouth daily.   Yes Historical Provider, MD  traZODone (DESYREL) 50 MG tablet Take 50-100 mg by mouth at bedtime as needed for sleep.    Yes Historical Provider, MD  warfarin (COUMADIN) 5 MG tablet Take 2.5-5 mg by mouth every evening. Take one tablet (5mg ) on Monday and Friday and one-half tablet (2.5mg ) on Tuesday, Wednesday, Thursday, Saturday, and Sunday.   Yes Historical Provider, MD  predniSONE (DELTASONE) 5 MG tablet Take 7.5 mg by mouth every other day. Polymyalgia rheumatica.    Historical Provider, MD  torsemide (DEMADEX) 20 MG tablet TAKE 1 TABLET BY MOUTH DAILY Patient not taking: Reported on 05/10/2016 04/24/16   Wellington Hampshire, MD    Physical Exam: Filed Vitals:   05/10/16 1321 05/10/16 1324 05/10/16 1328  BP:   146/49  Pulse:  76 66    Temp:   98.3 F (36.8 C)  TempSrc:  Oral Oral  Resp:  18 20  SpO2: 90% 89% 99%     General:  Appears calm and comfortable Eyes:  PERRL, EOMI, normal lids, iris ENT:  grossly normal hearing, lips & tongue, mmm Neck:  no LAD, masses or thyromegaly Cardiovascular:  RRR, III/VI systolic, 2+ LE pitting edema Respiratory: Respiratory sounds in bases bilaterally, crackles, mild increased effort. Abdomen:  soft, ntnd, NABS Skin:  no rash or induration seen on limited exam Musculoskeletal:  grossly normal tone BUE/BLE, good ROM, no bony abnormality Psychiatric:  grossly normal mood and affect, speech fluent and appropriate, AOx3 Neurologic:  CN 2-12 grossly intact, moves all extremities in coordinated fashion, sensation intact  Labs on Admission: I have personally reviewed following labs and imaging studies  CBC:  Recent Labs Lab 05/10/16 1331  WBC 6.6  NEUTROABS 5.4  HGB 13.4  HCT 40.9  MCV 91.7  PLT 99991111*   Basic Metabolic Panel:  Recent Labs Lab 05/10/16 1331  NA 142  K 4.6  CL 102  CO2 32  GLUCOSE 141*  BUN 31*  CREATININE 1.05*  CALCIUM 9.0   GFR: CrCl cannot be calculated (Unknown ideal weight.). Liver Function Tests:  Recent Labs Lab 05/10/16 1331  AST 34  ALT 19  ALKPHOS 74  BILITOT 1.7*  PROT 6.6  ALBUMIN 3.7   No results for input(s): LIPASE, AMYLASE in the last 168 hours. No results for input(s): AMMONIA in the last 168 hours. Coagulation Profile:  Recent Labs Lab 05/10/16 1331  INR 1.67*   Cardiac Enzymes: No results for input(s): CKTOTAL, CKMB, CKMBINDEX, TROPONINI in the last 168 hours. BNP (last 3 results) No results for input(s): PROBNP in the last 8760 hours. HbA1C: No results for input(s): HGBA1C in the last 72 hours. CBG: No results for input(s): GLUCAP in the last 168 hours. Lipid Profile: No results for input(s): CHOL, HDL, LDLCALC, TRIG, CHOLHDL, LDLDIRECT in the last 72 hours. Thyroid Function Tests: No results for  input(s): TSH,  T4TOTAL, FREET4, T3FREE, THYROIDAB in the last 72 hours. Anemia Panel: No results for input(s): VITAMINB12, FOLATE, FERRITIN, TIBC, IRON, RETICCTPCT in the last 72 hours. Urine analysis:    Component Value Date/Time   COLORURINE YELLOW 08/01/2015 2030   APPEARANCEUR CLEAR 08/01/2015 2030   LABSPEC 1.011 08/01/2015 2030   PHURINE 7.0 08/01/2015 2030   GLUCOSEU NEGATIVE 08/01/2015 2030   [place] NEGATIVE 08/01/2015 2030   [place] NEGATIVE 08/01/2015 2030   [place] 08/01/2015 2030   PROTEINUR NEGATIVE 08/01/2015 2030   UROBILINOGEN 1.0 08/01/2015 2030   NITRITE NEGATIVE 08/01/2015 2030   LEUKOCYTESUR NEGATIVE 08/01/2015 2030    Creatinine Clearance: CrCl cannot be calculated (Unknown ideal weight.).  Sepsis Labs: @LABRCNTIP (procalcitonin:4,lacticidven:4) )No results found for this or any previous visit (from the past 240 hour(s)).   Radiological Exams on Admission: Dg Chest 2 View  05/10/2016  CLINICAL DATA:  Shortness of breath and recent pneumonia. EXAM: CHEST  2 VIEW COMPARISON:  Seven/under size and 1,016 FINDINGS: Stable cardiac enlargement and appearance of single chamber pacemaker. Stable aortic atherosclerosis with aneurysmal disease of the descending thoracic aorta. There are small bilateral pleural effusions, right greater than left. No overt edema or infiltrate. IMPRESSION: Small bilateral pleural effusions without overt edema or infiltrate. Stable aortic atherosclerosis and aneurysmal disease of the descending thoracic aorta. Electronically Signed   By: Aletta Edouard M.D.   On: 05/10/2016 14:43    Assessment/Plan Active Problems:   ATRIAL FIBRILLATION   Hyperlipidemia   CHF (congestive heart failure) (HCC)   Acute respiratory failure (HCC)   Acute diastolic (congestive) heart failure (HCC)   Rheumatoid arthritis (HCC)   Insomnia   Acute respiratory failure: Likely secondary to mild CHF exacerbation with bronchospasm/asthma. No history  of COPD and nonsmoker. WBC normal, afebrile, non-productive mild intermittent cough in conjunction with patient being treated with antibiotics recently which only gave minimal improvement in overall symptoms so doubt pneumonia. EDP obtained d-dimer showing 1.7 so also ordered lower extremity Dopplers. These were negative for DVT. CT scan of V/Q workup canceled as highly doubt PE despite recent subtherapeutic INR of 1.67. INR 216. CXR w/ bilat small effusions - Med surge - O2 prn - CHF order set used - torsemide 40mg  PO given (allergy to lasix) - continue symbicort - Duonebs Q4 - consider ABX vs increased Steroids (on 7.5 daily for RA)if not improving  - procalcitonin - Echo HTN: - continue atenolol - Consider starting ACEi prior to DC  Chronic Afib and SSS: s/p defibrillator placement.  - continue warfarin  RA: - continue prednisone 7.5  CHronic pain: - continue norco  Anxiety: - continue Ativan  Insomnia: - contineu Trazodone  DVT prophylaxis: Lovenox  Code Status: FULL  Family Communication: neice  Disposition Plan: pending improvement   Consults called: none  Admission status: obs admission    MERRELL, DAVID J MD [facility]  If 7PM-7AM, please contact night-coverage [email or web address] Password [facility]  05/10/2016, 6:37 PM

## 2016-05-10 NOTE — ED Notes (Signed)
US at bedside

## 2016-05-10 NOTE — ED Notes (Signed)
Family at bedside. 

## 2016-05-10 NOTE — ED Provider Notes (Signed)
CSN: [id number]     Arrival date & time 05/10/16  1318 History   First MD Initiated Contact with Patient 05/10/16 1322     Chief Complaint  Patient presents with  . Shortness of Breath     (Consider location/radiation/quality/duration/timing/severity/associated sxs/prior Treatment) HPI Comments: Patient presents with shortness of breath. She has a history of hypertension, atrial for relation on Coumadin, tachybradycardia syndrome with pacemaker placement, AAA status post repair, aortic valve replacement with bioprosthetic valve. She states she's had shortness of breath over the last 3 weeks. She saw her primary care provider who did a chest x-ray and diagnosed her with bronchitis and a touch of pneumonia. She was started on 2 antibiotics which she completed. She doesn't know the name of the antibiotics. She felt like she was feeling a little bit better but then started getting short of breath and fatigued again. It's worsened over the last 3 days. She went back to see her PCP 2 days ago who started her on a prednisone taper as well as an albuterol inhaler. She's on day 2 of the prednisone. She has not noted any improvement in symptoms. She does not use oxygen at home. Her room air saturations in the emergency room were 88-89% on arrival. She also has noticed some swelling in her legs which she is not had in the past.  Patient is a 82 y.o. female presenting with shortness of breath.  Shortness of Breath Associated symptoms: no abdominal pain, no chest pain, no cough, no diaphoresis, no fever, no headaches, no rash and no vomiting     Past Medical History  Diagnosis Date  . Permanent atrial fibrillation ([place])     a. Chronic coumadin.  . Tachy-brady syndrome ([place])     a. Severe in the setting of permanent atrial fibrillation with tachybrady syndrome;  b. 08/2007 s/p MDT [place] ADSR01 DC PPM.  . S/P placement of cardiac pacemaker     a.  08/2007 s/p MDT [place] PPM.  . Brain aneurysm      Hx of  . Hypertension   . Thoracic aneurysm     a. 10/2011 CTA: 5.0 x 3.8 cm.  [name] AAA (abdominal aortic aneurysm) ([place])     a. 12/2011 U/S: 3.3 cm prox AAA.  . S/P aortic valve replacement with bioprosthetic valve     a. [facility].  [name] Aortic insufficiency     a. S/P bioprosthetic AVR;  b. 02/2013 Echo: EF 50-55%, mild to mod AI (mean grad 25mmHg).  . Chronic diastolic CHF (congestive heart failure) ([place])     a. 02/2013 Echo: EF 50-55%.  . Diverticulosis   . Hemorrhoids   . Gastritis   . Hepatitis B 1980's  . Skin cancer     face, ear  and left hand.  . Seizures ([place])   . Moderate mitral regurgitation     a. 02/2013 Echo: Mod MR.  . Moderate tricuspid regurgitation     a. 02/2013 Echo: Mod TR.  [name] Pulmonary hypertension ([place])     a. 02/2013 Echo: PASP 102mmHg.  . Chest pain     a. H/o nonobs cath;  b. 07/2013 Lexiscan MV: EF 66%, no ischemia.  . Shingles    Past Surgical History  Procedure Laterality Date  . Cerebral aneurysm repair    . Abdominal aortic aneurysm repair      Resection  . Aortic valve replacement      With bioprosthetic valve  . Pacemaker placement  08/2007  [employer]; AF permanent; s/p pacemaker for bradycardia with now well controlled ventricular response; Digoxin therapy, question level; Lower extremity venous issues  . Breast biopsy    . Cholecystectomy    . Vesicovaginal fistula closure w/ tah    . Appendectomy    . Lung biopsy  2005  . Skin cancer removal      ear, face and hand   Family History  Problem Relation Age of Onset  . Cancer Mother   . Heart disease Mother   . Hypertension Mother   . Varicose Veins Mother   . Heart disease Father   . Hypertension Father   . Heart disease Sister   . Hypertension Sister   . Varicose Veins Sister   . AAA (abdominal aortic aneurysm) Sister   . Heart disease Brother   . Hypertension Brother    Social History  Substance Use Topics  . Smoking status: Never Smoker   . Smokeless tobacco: None   . Alcohol Use: No   OB History    Gravida Para Term Preterm AB TAB SAB Ectopic Multiple Living   0 0 0 0 0 0 0 0 0 0      Review of Systems  Constitutional: Positive for fatigue. Negative for fever, chills and diaphoresis.  HENT: Negative for congestion, rhinorrhea and sneezing.   Eyes: Negative.   Respiratory: Positive for shortness of breath. Negative for cough and chest tightness.   Cardiovascular: Positive for leg swelling. Negative for chest pain.  Gastrointestinal: Negative for nausea, vomiting, abdominal pain, diarrhea and blood in stool.  Genitourinary: Negative for frequency, hematuria, flank pain and difficulty urinating.  Musculoskeletal: Negative for back pain and arthralgias.  Skin: Negative for rash.  Neurological: Negative for dizziness, speech difficulty, weakness, numbness and headaches.      Allergies  Fluticasone-salmeterol; Furosemide; Iodine; Iohexol; Sulfonamide derivatives; Verapamil; Latex; and Penicillins  Home Medications   Prior to Admission medications   Medication Sig Start Date End Date Taking? Authorizing Provider  albuterol (PROVENTIL HFA;VENTOLIN HFA) 108 (90 Base) MCG/ACT inhaler Inhale 1 puff into the lungs every 6 (six) hours as needed for wheezing or shortness of breath.   Yes Historical Provider, MD  atenolol (TENORMIN) 100 MG tablet Take 100 mg by mouth at bedtime.    Yes Historical Provider, MD  budesonide-formoterol (SYMBICORT) 80-4.5 MCG/ACT inhaler Inhale 2 puffs into the lungs 2 (two) times daily.   Yes Historical Provider, MD  diclofenac sodium (VOLTAREN) 1 % GEL Apply 4 g topically 2 (two) times daily.   Yes Historical Provider, MD  HYDROcodone-acetaminophen (NORCO/VICODIN) 5-325 MG tablet Take 1 tablet by mouth every 6 (six) hours as needed for moderate pain.   Yes Historical Provider, MD  latanoprost (XALATAN) 0.005 % ophthalmic solution Place 1 drop into both eyes at bedtime.   Yes Historical Provider, MD  LORazepam (ATIVAN) 0.5 MG  tablet Take 0.5 mg by mouth 3 (three) times daily. Scheduled; not PRN.   Yes Historical Provider, MD  meclizine (ANTIVERT) 25 MG tablet Take 12.5 mg by mouth 3 (three) times daily as needed for dizziness.   Yes Historical Provider, MD  ondansetron (ZOFRAN-ODT) 4 MG disintegrating tablet Take 4 mg by mouth every 8 (eight) hours as needed for nausea or vomiting.   Yes Historical Provider, MD  pantoprazole (PROTONIX) 40 MG tablet Take 40 mg by mouth daily.   Yes Historical Provider, MD  potassium chloride SA (K-DUR,KLOR-CON) 20 MEQ tablet Take 1 tablet (20 mEq total) by mouth every evening. 12/07/15  Yes Minna Merritts, MD  predniSONE (DELTASONE) 10 MG tablet Take by mouth See admin instructions. Take 5 tablets daily for 2 days, 4 tabs daily for 2 days, 3 tabs daily for 2 days, 2 tabs daily for 2 days, then 1 tab daily for 2 days.   Yes Historical Provider, MD  torsemide (DEMADEX) 20 MG tablet Take 20 mg by mouth daily.   Yes Historical Provider, MD  traZODone (DESYREL) 50 MG tablet Take 50-100 mg by mouth at bedtime as needed for sleep.    Yes Historical Provider, MD  warfarin (COUMADIN) 5 MG tablet Take 2.5-5 mg by mouth every evening. Take one tablet (5mg ) on Monday and Friday and one-half tablet (2.5mg ) on Tuesday, Wednesday, Thursday, Saturday, and Sunday.   Yes Historical Provider, MD  predniSONE (DELTASONE) 5 MG tablet Take 7.5 mg by mouth every other day. Polymyalgia rheumatica.    Historical Provider, MD  torsemide (DEMADEX) 20 MG tablet TAKE 1 TABLET BY MOUTH DAILY Patient not taking: Reported on 05/10/2016 04/24/16   Wellington Hampshire, MD   BP 146/49 mmHg  Pulse 66  Temp(Src) 98.3 F (36.8 C) (Oral)  Resp 20  SpO2 99% Physical Exam  Constitutional: She is oriented to person, place, and time. She appears well-developed and well-nourished.  HENT:  Head: Normocephalic and atraumatic.  Eyes: Pupils are equal, round, and reactive to light.  Neck: Normal range of motion. Neck supple.   Cardiovascular: Normal rate, regular rhythm and normal heart sounds.   Pulmonary/Chest: Effort normal and breath sounds normal. No respiratory distress. She has no wheezes. She has no rales. She exhibits no tenderness.  Diminished BS in the bases  Abdominal: Soft. Bowel sounds are normal. There is no tenderness. There is no rebound and no guarding.  Musculoskeletal: Normal range of motion. She exhibits no edema.  2+pitting edema on RLE, 1+ edema on LLE, no warmth or erythema  Lymphadenopathy:    She has no cervical adenopathy.  Neurological: She is alert and oriented to person, place, and time.  Skin: Skin is warm and dry. No rash noted.  Psychiatric: She has a normal mood and affect.    ED Course  Procedures (including critical care time) Labs Review Labs Reviewed  BRAIN NATRIURETIC PEPTIDE - Abnormal; Notable for the following:    B Natriuretic Peptide 216.8 (*)    All other components within normal limits  CBC WITH DIFFERENTIAL/PLATELET - Abnormal; Notable for the following:    Platelets 111 (*)    All other components within normal limits  COMPREHENSIVE METABOLIC PANEL - Abnormal; Notable for the following:    Glucose, Bld 141 (*)    BUN 31 (*)    Creatinine, Ser 1.05 (*)    Total Bilirubin 1.7 (*)    GFR calc non Af Amer 45 (*)    GFR calc Af Amer 52 (*)    All other components within normal limits  PROTIME-INR - Abnormal; Notable for the following:    Prothrombin Time 19.7 (*)    INR 1.67 (*)    All other components within normal limits  D-DIMER, QUANTITATIVE (NOT AT [facility]) - Abnormal; Notable for the following:    D-Dimer, Quant 1.87 (*)    All other components within normal limits  I-STAT TROPOININ, ED    Imaging Review Dg Chest 2 View  05/10/2016  CLINICAL DATA:  Shortness of breath and recent pneumonia. EXAM: CHEST  2 VIEW COMPARISON:  Seven/under size and 1,016 FINDINGS: Stable cardiac enlargement and appearance  of single chamber pacemaker. Stable aortic  atherosclerosis with aneurysmal disease of the descending thoracic aorta. There are small bilateral pleural effusions, right greater than left. No overt edema or infiltrate. IMPRESSION: Small bilateral pleural effusions without overt edema or infiltrate. Stable aortic atherosclerosis and aneurysmal disease of the descending thoracic aorta. Electronically Signed   By: Aletta Edouard M.D.   On: 05/10/2016 14:43   I have personally reviewed and evaluated these images and lab results as part of my medical decision-making.   EKG Interpretation   Date/Time:  Saturday May 10 2016 14:00:42 EDT Ventricular Rate:  75 PR Interval:    QRS Duration: 135 QT Interval:  450 QTC Calculation: 503 R Axis:   127 Text Interpretation:  Afib/flutter and ventricular-paced rhythm No further  analysis attempted due to paced rhythm Confirmed by Morgyn Marut  MD, [place] Freyre  ([id number]) on 05/10/2016 2:03:37 PM      MDM   Final diagnoses:  Hypoxia  Acute systolic congestive heart failure ([place])    Patient presents with worsening shortness of breath. There is no associated chest pain. No ischemic changes on EKG although it is a paced rhythm. Chest x-ray is clear. There is no evidence of pneumonia or definite fluid overload. However she is having some ongoing hypoxia. Off oxygen she maintains an oxygen saturation of 86-87% on room air, even after a nebulizer treatment. This could be resulting from fluid overload. Her BNP is high and she has increased pedal edema. I did ultrasound her right extremity since it is markedly more swollen than the left. This was negative for DVT. Her d-dimer is mildly elevated and a CT chest is pending. Given the ongoing hypoxia, I will consult the hospitalist for admission.  Pt reports a prior "allergy" to CT dye.  She says that she has chills with it. She denies that she's had hives itching or shortness of breath however she does say that she's been premedicated before all of her prior CTs. She states  that she does fine if she's premedicated. According to the new radiology protocol, she needs a 13 hour prep unless it is an emergent study.  Will give the first dose of prednisone.  Discussed with Dr Marily Memos who will admit pt.  Malvin Johns, MD 05/10/16 1622

## 2016-05-10 NOTE — Progress Notes (Signed)
VASCULAR LAB PRELIMINARY  PRELIMINARY  PRELIMINARY  PRELIMINARY  Right lower extremity venous duplex has been completed.     Right:  No evidence of DVT, superficial thrombosis, or Baker's cyst.  Gave result to Santiago Glad, [place], RVT, RDMS 05/10/2016, 3:47 PM

## 2016-05-10 NOTE — ED Notes (Signed)
MD at bedside. 

## 2016-05-10 NOTE — ED Notes (Signed)
Pt brought in by ems from home, c/o SOB x 3 weeks , recent DX pneumonia x 3 weeks ago

## 2016-05-10 NOTE — ED Notes (Signed)
Attempt to call report x 1. Clarene Critchley will call unit when she is ready to take report

## 2016-05-11 ENCOUNTER — Encounter (HOSPITAL_COMMUNITY): Payer: Self-pay | Admitting: *Deleted

## 2016-05-11 ENCOUNTER — Other Ambulatory Visit: Payer: Self-pay

## 2016-05-11 ENCOUNTER — Observation Stay (HOSPITAL_BASED_OUTPATIENT_CLINIC_OR_DEPARTMENT_OTHER): Payer: Medicare Other

## 2016-05-11 DIAGNOSIS — M069 Rheumatoid arthritis, unspecified: Secondary | ICD-10-CM | POA: Diagnosis not present

## 2016-05-11 DIAGNOSIS — I509 Heart failure, unspecified: Secondary | ICD-10-CM | POA: Diagnosis not present

## 2016-05-11 DIAGNOSIS — G4700 Insomnia, unspecified: Secondary | ICD-10-CM | POA: Diagnosis not present

## 2016-05-11 DIAGNOSIS — I482 Chronic atrial fibrillation: Secondary | ICD-10-CM | POA: Diagnosis not present

## 2016-05-11 DIAGNOSIS — J9601 Acute respiratory failure with hypoxia: Secondary | ICD-10-CM | POA: Diagnosis not present

## 2016-05-11 LAB — BASIC METABOLIC PANEL
ANION GAP: 7 (ref 5–15)
BUN: 32 mg/dL — ABNORMAL HIGH (ref 6–20)
CALCIUM: 8.8 mg/dL — AB (ref 8.9–10.3)
CO2: 33 mmol/L — ABNORMAL HIGH (ref 22–32)
Chloride: 101 mmol/L (ref 101–111)
Creatinine, Ser: 1.11 mg/dL — ABNORMAL HIGH (ref 0.44–1.00)
GFR, EST AFRICAN AMERICAN: 49 mL/min — AB (ref >60)
GFR, EST NON AFRICAN AMERICAN: 42 mL/min — AB (ref >60)
Glucose, Bld: 149 mg/dL — ABNORMAL HIGH (ref 65–99)
Potassium: 4.5 mmol/L (ref 3.5–5.1)
SODIUM: 141 mmol/L (ref 135–145)

## 2016-05-11 LAB — ECHOCARDIOGRAM COMPLETE
AV Peak grad: 11 mmHg
AV VEL mean LVOT/AV: 0.33
AV pk vel: 167 cm/s
AVG: 7 mmHg
Ao pk vel: 0.33 m/s
CHL CUP DOP CALC LVOT VTI: 13.3 cm
CHL CUP TV REG PEAK VELOCITY: 328 cm/s
DOP CAL AO MEAN VELOCITY: 123 cm/s
EWDT: 151 ms
FS: 28 % (ref 28–44)
Height: 61 in
IV/PV OW: 0.96
LA vol index: 48.8 mL/m2
LA vol: 81.9 mL
LADIAMINDEX: 2.92 cm/m2
LASIZE: 49 mm
LAVOLA4C: 83.7 mL
LEFT ATRIUM END SYS DIAM: 49 mm
LVOT peak VTI: 0.35 cm
LVOT peak vel: 55.8 cm/s
MRPISAEROA: 0.14 cm2
MV Annulus VTI: 167 cm
MV Dec: 151
MV M vel: 366
MV VTI: 169 cm
MVG: 60 mmHg
MVPG: 6 mmHg
MVPKEVEL: 118 m/s
PV Reg grad dias: 10 mmHg
PV Reg vel dias: 157 cm/s
PW: 13.1 mm — AB (ref 0.6–1.1)
TAPSE: 15.2 mm
TRMAXVEL: 328 cm/s
VTI: 37.5 cm
WEIGHTICAEL: 2437.41 [oz_av]

## 2016-05-11 LAB — CBC
HEMATOCRIT: 38.6 % (ref 36.0–46.0)
Hemoglobin: 12.1 g/dL (ref 12.0–15.0)
MCH: 29.4 pg (ref 26.0–34.0)
MCHC: 31.3 g/dL (ref 30.0–36.0)
MCV: 93.7 fL (ref 78.0–100.0)
PLATELETS: 104 10*3/uL — AB (ref 150–400)
RBC: 4.12 MIL/uL (ref 3.87–5.11)
RDW: 14.2 % (ref 11.5–15.5)
WBC: 2.6 10*3/uL — AB (ref 4.0–10.5)

## 2016-05-11 LAB — TROPONIN I
TROPONIN I: 0.03 ng/mL (ref <0.031)
TROPONIN I: 0.03 ng/mL (ref <0.031)

## 2016-05-11 LAB — PROTIME-INR
INR: 2.06 — ABNORMAL HIGH (ref 0.00–1.49)
Prothrombin Time: 23.1 seconds — ABNORMAL HIGH (ref 11.6–15.2)

## 2016-05-11 MED ORDER — DEXTROSE 5 % IV SOLN
50.0000 mg | Freq: Once | INTRAVENOUS | Status: AC
  Administered 2016-05-11: 50 mg via INTRAVENOUS
  Filled 2016-05-11: qty 50

## 2016-05-11 MED ORDER — WARFARIN SODIUM 2.5 MG PO TABS
2.5000 mg | ORAL_TABLET | Freq: Once | ORAL | Status: AC
  Administered 2016-05-11: 2.5 mg via ORAL
  Filled 2016-05-11: qty 1

## 2016-05-11 NOTE — Progress Notes (Signed)
Patient and niece (who patient lives with) both complained that patient has been having a hard time at times sipping on water and finds that she chokes every once and awhile.  Patient reported having a recent episode of pneumonia and stated she has noticed this over the past couple of months.  Paged MD with findings for a ST consult.  Will continue to monitor.

## 2016-05-11 NOTE — Progress Notes (Signed)
*  PRELIMINARY RESULTS* Echocardiogram 2D Echocardiogram has been performed.  [name] 05/11/2016, 4:04 PM

## 2016-05-11 NOTE — Care Management Obs Status (Signed)
[place] NOTIFICATION   Patient Details  Name: [name] MRN: [id number] Date of Birth: 12/12/1921   Medicare Observation Status Notification Given:  Yes    Erenest Rasher, RN 05/11/2016, 6:05 PM

## 2016-05-11 NOTE — Progress Notes (Addendum)
PROGRESS NOTE    [name]  [id number] DOB: 1922-11-14 DOA: 05/10/2016 PCP: [name], MD   Brief Narrative:  HPI by Dr. Saralyn Pilar on 05/10/16 82 y.o. female with medical history significant of fibrillation, tachybradycardia syndrome, status post cardiac pacemaker, brain aneurysm status post clipping, AAA, aortic valve replacement, chronic diastolic CHF, hepatitis B, postanesthesia seizure, presented with three-week history of shortness of breath. Initially treated for pneumonia by PCP with only minimal improvement in symptoms. Shortness of breath became significantly worse over the last 3 days. Associated with lower extremity swelling bilaterally, saw her PCP 2 days ago put her on steroids, prednisone 50 mg daily and nebulizer treatments without any appreciable improvement in symptoms. Denies fevers, neck stiffness, headache, nausea, vomiting, diarrhea, abdominal pain, chest pain, palpitations. Patient only has intermittent occasional dry cough. The treatment in the ED with improvement in symptoms. Patient also endorses a 10 pound weight gain over the last several weeks.  Interim history Continues to be SOB and feel like she has extra fluid on board.  She was taking torsemide 40mg  at home to offset the weight gain, so this may not be adequate.     Assessment & Plan:   Elevated PAP, pulmonary HTN?, Acute respiratory failure - Reviewed previous TTE and noted that she had PAP of 70, this would be consistent with pHTN.  Systolic and diastolic parameters were normal.  This would explain her elevated JVD and lung issues, but mild LE edema - She does note weight gain, increased doses of torsemide with no effect, and SOB.  She recently was at the beach, and maybe change in eating habits with increased salt?  She denies, but notes that she "did not do well at the beach."   - Spoke with pharmacy and gave one dose of ethacrynic acid today given sulfa allergy, lasix allergy and I  think she needs an IV option - Monitor I/O, daily weights - O2 as needed to keep sats > 90% - I have a low suspicion for PE given her Well's score is 0.  She did to go the beach, but the travel was by car with frequent stops.  She has no LE pain or unilateral edema - If no improvement with diuresis, consider CTA (she has a dye allergy)  Atrial fibrillation with SSS (defibrillator in place) - Continue coumadin, INR is therapeutic now (stop lovenox) - Continue beta blocker - Telemetry  Rheumatoid arthritis  - On chronic steroids, continue    Insomnia - Continue home trazodone  HTN - Diuresis as noted above, atenolol  Chronic pain - Continue home hydrocodone  Anxiety - Continue home ativan  Dysphagia - Reported by nursing problems with liquids and solids - SLP evaluation ordered.    DVT prophylaxis:Coumadin and lovenox bridge while subtherapeutic per pharmacy Code Status: Full  Family Communication: None at bedside Disposition Plan: Pending improvement in symptoms   Consultants:   None  Procedures:   None  Antimicrobials:  None    Subjective: Patient reports continued SOB.  At home, she noted an 8 # weight gain.  She was recently at the beach with her niece and had a change in her diet.  She also noted an increasing dose of torsemide at home up to 40mg  without any relief in the SOB.  Her drive to the beach was only 4 hours and they stopped frequently.  She did not sit in the car for greater that 4 hours. Though her D dimer is elevated, I have  a relatively low suspicion for PE given her story.  She does have some pain in the lower leg, however, it is with palpation of the tibia and calves are non tender and non swollen.  Well's score of 0.  Given Age, D dimer may need a higher cut off point.   Objective: Filed Vitals:   05/10/16 2029 05/11/16 0556 05/11/16 0814 05/11/16 0816  BP: 139/72 130/72    Pulse: 62 67    Temp: 97.8 F (36.6 C) 97.7 F (36.5 C)      TempSrc: Oral Oral    Resp:  18    Height:      Weight: 152 lb 5.4 oz (69.1 kg)     SpO2: 98% 98% 98% 97%    Intake/Output Summary (Last 24 hours) at 05/11/16 0846 Last data filed at 05/10/16 2055  Gross per 24 hour  Intake    120 ml  Output      0 ml  Net    120 ml   Filed Weights   05/10/16 2029  Weight: 152 lb 5.4 oz (69.1 kg)    Examination:  General exam: Appears calm and comfortable, wearing nasal cannula, speaking in full sentences.   Respiratory system: Crackles in the bases, no wheezing. Respiratory effort normal. Cardiovascular system: S1 & S2 heard, RR, NR. + JVD to jawline.  No pedal edema. Gastrointestinal system: Abdomen is nondistended, soft and nontender. Normal bowel sounds heard. Central nervous system: Alert and oriented. No focal neurological deficits. Skin: No rashes, lesions or ulcers Psychiatry: Judgement and insight appear normal. Mood & affect appropriate.     Data Reviewed:   CBC:  Recent Labs Lab 05/10/16 1331 05/11/16 0501  WBC 6.6 2.6*  NEUTROABS 5.4  --   HGB 13.4 12.1  HCT 40.9 38.6  MCV 91.7 93.7  PLT 111* [id number]*   Basic Metabolic Panel:  Recent Labs Lab 05/10/16 1331 05/11/16 0501  NA 142 141  K 4.6 4.5  CL 102 101  CO2 32 33*  GLUCOSE 141* 149*  BUN 31* 32*  CREATININE 1.05* 1.11*  CALCIUM 9.0 8.8*   GFR: Estimated Creatinine Clearance: 29.3 mL/min (by C-G formula based on Cr of 1.11). Liver Function Tests:  Recent Labs Lab 05/10/16 1331  AST 34  ALT 19  ALKPHOS 74  BILITOT 1.7*  PROT 6.6  ALBUMIN 3.7   Coagulation Profile:  Recent Labs Lab 05/10/16 1331 05/11/16 0501  INR 1.67* 2.06*    Radiology Studies: Dg Chest 2 View  05/10/2016  CLINICAL DATA:  Shortness of breath and recent pneumonia. EXAM: CHEST  2 VIEW COMPARISON:  Seven/under size and 1,016 FINDINGS: Stable cardiac enlargement and appearance of single chamber pacemaker. Stable aortic atherosclerosis with aneurysmal disease of the  descending thoracic aorta. There are small bilateral pleural effusions, right greater than left. No overt edema or infiltrate. IMPRESSION: Small bilateral pleural effusions without overt edema or infiltrate. Stable aortic atherosclerosis and aneurysmal disease of the descending thoracic aorta. Electronically Signed   By: Aletta Edouard M.D.   On: 05/10/2016 14:43    Scheduled Meds: . atenolol  100 mg Oral QHS  . enoxaparin (LOVENOX) injection  65 mg Subcutaneous BID  . latanoprost  1 drop Both Eyes QHS  . LORazepam  0.5 mg Oral TID  . mometasone-formoterol  2 puff Inhalation BID  . pantoprazole  40 mg Oral Daily  . potassium chloride SA  20 mEq Oral QPM  . predniSONE  7.5 mg Oral QODAY  .  sodium chloride flush  3 mL Intravenous Q12H  . sodium chloride flush  3 mL Intravenous Q12H  . torsemide  40 mg Oral Daily  . Warfarin - Pharmacist Dosing Inpatient   Does not apply q1800   Continuous Infusions:       Time spent: 25 minutes    Gilles Chiquito, MD [facility] Pager [phone]  If 7PM-7AM, please contact night-coverage [email or web address] Password [email or web address] 05/11/2016, 8:46 AM

## 2016-05-11 NOTE — Progress Notes (Addendum)
[facility] for Warfarin/Lovenox Indication: Afib, AVR  Allergies  Allergen Reactions  . Fluticasone-Salmeterol Anaphylaxis  . Furosemide Shortness Of Breath  . Iodine Other (See Comments)    Reaction:  Fever and chills   . Iohexol Other (See Comments)    Desc:  Pt requires a 13 hr prep, when doing exam with contrast....fever, chills, dyspnea (slg/04/27/09--given 2 hour solumedrol/ benadryl prep--no reaction).    . Sulfonamide Derivatives Nausea And Vomiting  . Verapamil Nausea And Vomiting and Other (See Comments)    Reaction:  Loss of appetite   . Latex Rash  . Penicillins Rash and Other (See Comments)    Pt unable to answer additional questions about this medication.   Has patient had a PCN reaction causing immediate rash, facial/tongue/throat swelling, SOB or lightheadedness with hypotension: Yes Has patient had a PCN reaction causing severe rash involving mucus membranes or skin necrosis: UnknownHas patient had a PCN reaction that required hospitalization NoHas patient had a PCN reaction occurring within the last 10 years: No If all of the above answers are "NO", then may proceed with Cephalosporin    Patient Measurements: Height: 5\' 1"  (154.9 cm) Weight: 152 lb 5.4 oz (69.1 kg) IBW/kg (Calculated) : 47.8  Vital Signs: Temp: 97.7 F (36.5 C) (06/25 0556) Temp Source: Oral (06/25 0556) BP: 130/72 mmHg (06/25 0556) Pulse Rate: 67 (06/25 0556)  Labs:  Recent Labs  05/10/16 1331 05/11/16 0501  HGB 13.4 12.1  HCT 40.9 38.6  PLT 111* 104*  LABPROT 19.7* 23.1*  INR 1.67* 2.06*  CREATININE 1.05* 1.11*    Estimated Creatinine Clearance: 29.3 mL/min (by C-G formula based on Cr of 1.11).   Medications:  Prescriptions prior to admission  Medication Sig Dispense Refill Last Dose  . albuterol (PROVENTIL HFA;VENTOLIN HFA) 108 (90 Base) MCG/ACT inhaler Inhale 1 puff into the lungs every 6 (six) hours as needed for wheezing or shortness of  breath.   05/10/2016 at 1130  . atenolol (TENORMIN) 100 MG tablet Take 100 mg by mouth at bedtime.    05/09/2016 at 2130  . budesonide-formoterol (SYMBICORT) 80-4.5 MCG/ACT inhaler Inhale 2 puffs into the lungs 2 (two) times daily.   05/10/2016 at 0530  . diclofenac sodium (VOLTAREN) 1 % GEL Apply 4 g topically 2 (two) times daily.   05/09/2016 at Unknown time  . HYDROcodone-acetaminophen (NORCO/VICODIN) 5-325 MG tablet Take 1 tablet by mouth every 6 (six) hours as needed for moderate pain.   over a month ago at Unknown time  . latanoprost (XALATAN) 0.005 % ophthalmic solution Place 1 drop into both eyes at bedtime.   05/09/2016 at Unknown time  . LORazepam (ATIVAN) 0.5 MG tablet Take 0.5 mg by mouth 3 (three) times daily. Scheduled; not PRN.   05/10/2016 at am  . meclizine (ANTIVERT) 25 MG tablet Take 12.5 mg by mouth 3 (three) times daily as needed for dizziness.   Past Month at Unknown time  . ondansetron (ZOFRAN-ODT) 4 MG disintegrating tablet Take 4 mg by mouth every 8 (eight) hours as needed for nausea or vomiting.   05/10/2016 at 1100  . pantoprazole (PROTONIX) 40 MG tablet Take 40 mg by mouth daily.   05/10/2016 at Unknown time  . potassium chloride SA (K-DUR,KLOR-CON) 20 MEQ tablet Take 1 tablet (20 mEq total) by mouth every evening. 90 tablet 3 05/09/2016 at Unknown time  . predniSONE (DELTASONE) 10 MG tablet Take by mouth See admin instructions. Take 5 tablets daily for 2 days, 4  tabs daily for 2 days, 3 tabs daily for 2 days, 2 tabs daily for 2 days, then 1 tab daily for 2 days.   05/10/2016 at Unknown time  . torsemide (DEMADEX) 20 MG tablet Take 20 mg by mouth daily.   05/10/2016 at Unknown time  . traZODone (DESYREL) 50 MG tablet Take 50-100 mg by mouth at bedtime as needed for sleep.    05/09/2016 at Unknown time  . warfarin (COUMADIN) 5 MG tablet Take 2.5-5 mg by mouth every evening. Take one tablet (5mg ) on Monday and Friday and one-half tablet (2.5mg ) on Tuesday, Wednesday, Thursday, Saturday,  and Sunday.   05/09/2016 at 1800  . predniSONE (DELTASONE) 5 MG tablet Take 7.5 mg by mouth every other day. Polymyalgia rheumatica.     [name] torsemide (DEMADEX) 20 MG tablet TAKE 1 TABLET BY MOUTH DAILY (Patient not taking: Reported on 05/10/2016) 30 tablet 6    Scheduled:  . atenolol  100 mg Oral QHS  . enoxaparin (LOVENOX) injection  65 mg Subcutaneous BID  . small volume/piggyback builder  50 mg Intravenous Once  . latanoprost  1 drop Both Eyes QHS  . LORazepam  0.5 mg Oral TID  . mometasone-formoterol  2 puff Inhalation BID  . pantoprazole  40 mg Oral Daily  . potassium chloride SA  20 mEq Oral QPM  . predniSONE  7.5 mg Oral QODAY  . sodium chloride flush  3 mL Intravenous Q12H  . sodium chloride flush  3 mL Intravenous Q12H  . Warfarin - Pharmacist Dosing Inpatient   Does not apply q1800    Assessment: 82 y.o. female with medical history significant of fibrillation, tachybradycardia syndrome, status post cardiac pacemaker, brain aneurysm status post clipping, AAA, aortic valve replacement, chronic diastolic CHF, hepatitis B, postanesthesia seizure, presented with three-week history of shortness of breath. Admitted for CHF +/- asthma exacerbation. Pharmacy consulted to dose warfarin bridged with Lovenox while INR low.   Baseline INR: low  Prior anticoagulation: warfarin 2.5 mg daily except 5 mg on Mon, Fri; LD 6/23  Significant events:  Today, 05/11/2016:  CBC: Hgb wnl; Plt low but approaching 100k  INR increased but not yet at goal  SCr borderline at ~30 ml/min, UOP not charted  Major drug interactions: none  No bleeding issues per nursing  Heart diet ordered, eating 50% of meals  Goal of Therapy: INR 2.5-3 LMWH Anti-Xa level 0.6-1 drawn 4 hrs after dose  Plan:  Continue Lovenox 65 mg SQ q12 hr. If SCr continues to worsen will adjust to q24  Warfarin 2.5 mg PO x1 tonight  Daily INR  CBC at least q72 hr while on warfarin  Monitor for signs of bleeding or  thrombosis   Reuel Boom, PharmD Pager: [phone] 05/11/2016, 11:37 AM

## 2016-05-12 DIAGNOSIS — I482 Chronic atrial fibrillation: Secondary | ICD-10-CM | POA: Diagnosis not present

## 2016-05-12 DIAGNOSIS — E785 Hyperlipidemia, unspecified: Secondary | ICD-10-CM

## 2016-05-12 DIAGNOSIS — I5031 Acute diastolic (congestive) heart failure: Secondary | ICD-10-CM | POA: Diagnosis not present

## 2016-05-12 DIAGNOSIS — J9601 Acute respiratory failure with hypoxia: Secondary | ICD-10-CM | POA: Diagnosis not present

## 2016-05-12 LAB — BASIC METABOLIC PANEL
Anion gap: 6 (ref 5–15)
BUN: 35 mg/dL — AB (ref 6–20)
CALCIUM: 8.5 mg/dL — AB (ref 8.9–10.3)
CO2: 36 mmol/L — ABNORMAL HIGH (ref 22–32)
Chloride: 99 mmol/L — ABNORMAL LOW (ref 101–111)
Creatinine, Ser: 1.28 mg/dL — ABNORMAL HIGH (ref 0.44–1.00)
GFR calc Af Amer: 41 mL/min — ABNORMAL LOW (ref >60)
GFR, EST NON AFRICAN AMERICAN: 35 mL/min — AB (ref >60)
GLUCOSE: 100 mg/dL — AB (ref 65–99)
Potassium: 3.7 mmol/L (ref 3.5–5.1)
Sodium: 141 mmol/L (ref 135–145)

## 2016-05-12 LAB — PROTIME-INR
INR: 3.46 — ABNORMAL HIGH (ref 0.00–1.49)
Prothrombin Time: 34.1 seconds — ABNORMAL HIGH (ref 11.6–15.2)

## 2016-05-12 MED ORDER — RESOURCE THICKENUP CLEAR PO POWD
ORAL | Status: DC | PRN
  Filled 2016-05-12: qty 125

## 2016-05-12 MED ORDER — BUSPIRONE HCL 5 MG PO TABS
2.5000 mg | ORAL_TABLET | Freq: Two times a day (BID) | ORAL | Status: DC
  Administered 2016-05-13 – 2016-05-14 (×3): 2.5 mg via ORAL
  Filled 2016-05-12 (×3): qty 1

## 2016-05-12 MED ORDER — BUMETANIDE 0.25 MG/ML IJ SOLN
1.0000 mg | Freq: Once | INTRAMUSCULAR | Status: AC
  Administered 2016-05-12: 1 mg via INTRAVENOUS
  Filled 2016-05-12: qty 4

## 2016-05-12 MED ORDER — NYSTATIN 100000 UNIT/ML MT SUSP
5.0000 mL | Freq: Four times a day (QID) | OROMUCOSAL | Status: DC
  Administered 2016-05-12 – 2016-05-14 (×9): 500000 [IU] via ORAL
  Filled 2016-05-12 (×9): qty 5

## 2016-05-12 NOTE — Progress Notes (Signed)
I spoke with Patients niece Maudie Mercury and updated her on plan and progress.   Maudie Mercury reports that Ms. Resendiz also struggles with anxiety.  She was recently started on Buspar 5mg  BID from her PCP, but this made her jittery.  They are interested in trying the Buspar again at a lower dose.  Will start Buspar 2.5mg  BID tomorrow in the AM to see how she does with it.  If she still cannot tolerate this medication, would consider starting an SSRI or SNRI at low dose given age.  She is currently taking ativan TID and they would like to wean down on this.   Gilles Chiquito, MD

## 2016-05-12 NOTE — Progress Notes (Signed)
PROGRESS NOTE    [name]  [id number] DOB: [birth year] DOA: 05/10/2016 PCP: [name], MD   Brief Narrative:  HPI by Dr. Saralyn Pilar on 05/10/16 82 y.o. female with medical history significant of fibrillation, tachybradycardia syndrome, status post cardiac pacemaker, brain aneurysm status post clipping, AAA, aortic valve replacement, chronic diastolic CHF, hepatitis B, postanesthesia seizure, presented with three-week history of shortness of breath. Initially treated for pneumonia by PCP with only minimal improvement in symptoms. Shortness of breath became significantly worse over the last 3 days. Associated with lower extremity swelling bilaterally, saw her PCP 2 days ago put her on steroids, prednisone 50 mg daily and nebulizer treatments without any appreciable improvement in symptoms. Denies fevers, neck stiffness, headache, nausea, vomiting, diarrhea, abdominal pain, chest pain, palpitations. Patient only has intermittent occasional dry cough. The treatment in the ED with improvement in symptoms. Patient also endorses a 10 pound weight gain over the last several weeks.  Interim history 6/26 - had almost 2L output with ethacrynic acid dose yesterday, she is feeling much better.  SLP seen and recommendations noted.     Assessment & Plan:   Elevated PAP, pulmonary HTN?, Acute respiratory failure - TTE 05/11/16 - Normal EF, mild concentric hypertrophy (diastolic dysfunction?) and PAP of 46 - Weight is down 1 pound, net output of almost 2 L - Discussed with pharmacy, as she has been on bumex previously and torsemide as an outpatient, probably no need for ethacrynic acid, will do a trial of 1mg  of bumex IV today.  - Monitor I/O, daily weights - O2 as needed to keep sats > 90% - I have a low suspicion for PE given her Well's score is 0.  She did to go the beach, but the travel was by car with frequent stops.  She has no LE pain or unilateral edema - She requires IV diuresis  1 more day at least.    Atrial fibrillation with SSS (defibrillator in place) - Continue coumadin per pharmacy - Continue beta blocker - Telemetry  Rheumatoid arthritis  - On chronic steroids, continue    Insomnia - Continue home trazodone  HTN - BP 149/70 today, likely at goal given age - Diuresis as noted above, atenolol  Chronic pain - Continue home hydrocodone  Anxiety - Continue home ativan  Dysphagia - Discussed with speech pathologist, appears to have thrush on exam.  She was given options to help her swallowing - Trial of nystatin S/Sw    DVT prophylaxis:Coumadin, INR monitoring Code Status: Full  Family Communication: None at bedside Disposition Plan: Pending improvement in symptoms   Consultants:   None  Procedures:   None  Antimicrobials:  None    Subjective: SOB improved.  She is off O2.  She has some plaques in her throat  Objective: Filed Vitals:   05/11/16 1958 05/11/16 2016 05/12/16 0458 05/12/16 0818  BP:  122/71 149/70   Pulse:  59 62   Temp:  97.4 F (36.3 C) 97.7 F (36.5 C)   TempSrc:  Oral Oral   Resp:  18 20   Height:      Weight:   151 lb 3.8 oz (68.6 kg)   SpO2: 99% 97% 98% 100%    Intake/Output Summary (Last 24 hours) at 05/12/16 1134 Last data filed at 05/12/16 1021  Gross per 24 hour  Intake    240 ml  Output   2200 ml  Net  -1960 ml   Filed Weights   05/10/16 2029  05/12/16 0458  Weight: 152 lb 5.4 oz (69.1 kg) 151 lb 3.8 oz (68.6 kg)    Examination:  General exam: Appears calm and comfortable, breathing improved  Respiratory system: Crackles in the bases, but improved today, no wheezing. Respiratory effort normal. Cardiovascular system: S1 & S2 heard, RR, NR. + JVD, improved today.   Gastrointestinal system: Abdomen is nondistended, soft and nontender. Normal bowel sounds heard. Central nervous system: Alert and oriented. No focal neurological deficits. Skin: No rashes, lesions or ulcers  noted Psychiatry: Judgement and insight appear normal. Mood & affect appropriate.     Data Reviewed:   CBC:  Recent Labs Lab 05/10/16 1331 05/11/16 0501  WBC 6.6 2.6*  NEUTROABS 5.4  --   HGB 13.4 12.1  HCT 40.9 38.6  MCV 91.7 93.7  PLT 111* [id number]*   Basic Metabolic Panel:  Recent Labs Lab 05/10/16 1331 05/11/16 0501 05/12/16 0422  NA 142 141 141  K 4.6 4.5 3.7  CL 102 101 99*  CO2 32 33* 36*  GLUCOSE 141* 149* 100*  BUN 31* 32* 35*  CREATININE 1.05* 1.11* 1.28*  CALCIUM 9.0 8.8* 8.5*   GFR: Estimated Creatinine Clearance: 25.4 mL/min (by C-G formula based on Cr of 1.28). Liver Function Tests:  Recent Labs Lab 05/10/16 1331  AST 34  ALT 19  ALKPHOS 74  BILITOT 1.7*  PROT 6.6  ALBUMIN 3.7   Coagulation Profile:  Recent Labs Lab 05/10/16 1331 05/11/16 0501 05/12/16 0422  INR 1.67* 2.06* 3.46*    Radiology Studies: Dg Chest 2 View  05/10/2016  CLINICAL DATA:  Shortness of breath and recent pneumonia. EXAM: CHEST  2 VIEW COMPARISON:  Seven/under size and 1,016 FINDINGS: Stable cardiac enlargement and appearance of single chamber pacemaker. Stable aortic atherosclerosis with aneurysmal disease of the descending thoracic aorta. There are small bilateral pleural effusions, right greater than left. No overt edema or infiltrate. IMPRESSION: Small bilateral pleural effusions without overt edema or infiltrate. Stable aortic atherosclerosis and aneurysmal disease of the descending thoracic aorta. Electronically Signed   By: Aletta Edouard M.D.   On: 05/10/2016 14:43    Scheduled Meds: . atenolol  100 mg Oral QHS  . enoxaparin (LOVENOX) injection  65 mg Subcutaneous BID  . latanoprost  1 drop Both Eyes QHS  . LORazepam  0.5 mg Oral TID  . mometasone-formoterol  2 puff Inhalation BID  . pantoprazole  40 mg Oral Daily  . potassium chloride SA  20 mEq Oral QPM  . predniSONE  7.5 mg Oral QODAY  . sodium chloride flush  3 mL Intravenous Q12H  . sodium chloride  flush  3 mL Intravenous Q12H  . Warfarin - Pharmacist Dosing Inpatient   Does not apply q1800   Continuous Infusions:       Time spent: 25 minutes    Gilles Chiquito, MD [facility] Pager [phone]  If 7PM-7AM, please contact night-coverage [email or web address] Password [facility] 05/12/2016, 11:34 AM

## 2016-05-12 NOTE — Evaluation (Signed)
Clinical/Bedside Swallow Evaluation Patient Details  Name: [name] MRN: [id number] Date of Birth: 12/14/1921  Today's Date: 05/12/2016 Time: SLP Start Time (ACUTE ONLY): 65 SLP Stop Time (ACUTE ONLY): 1121 SLP Time Calculation (min) (ACUTE ONLY): 51 min  Past Medical History:  Past Medical History  Diagnosis Date  . Permanent atrial fibrillation ([place])     a. Chronic coumadin.  . Tachy-brady syndrome ([place])     a. Severe in the setting of permanent atrial fibrillation with tachybrady syndrome;  b. 08/2007 s/p MDT [place] ADSR01 DC PPM.  . S/P placement of cardiac pacemaker     a.  08/2007 s/p MDT [place] PPM.  . Brain aneurysm     Hx of  . Hypertension   . Thoracic aneurysm     a. 10/2011 CTA: 5.0 x 3.8 cm.  [name] AAA (abdominal aortic aneurysm) ([place])     a. 12/2011 U/S: 3.3 cm prox AAA.  . S/P aortic valve replacement with bioprosthetic valve     a. [facility].  [name] Aortic insufficiency     a. S/P bioprosthetic AVR;  b. 02/2013 Echo: EF 50-55%, mild to mod AI (mean grad 85mmHg).  . Chronic diastolic CHF (congestive heart failure) ([place])     a. 02/2013 Echo: EF 50-55%.  . Diverticulosis   . Hemorrhoids   . Gastritis   . Hepatitis B 1980's  . Skin cancer     face, ear  and left hand.  . Seizures ([place])   . Moderate mitral regurgitation     a. 02/2013 Echo: Mod MR.  . Moderate tricuspid regurgitation     a. 02/2013 Echo: Mod TR.  [name] Pulmonary hypertension ([place])     a. 02/2013 Echo: PASP 64mmHg.  . Chest pain     a. H/o nonobs cath;  b. 07/2013 Lexiscan MV: EF 66%, no ischemia.  [name] Shingles    Past Surgical History:  Past Surgical History  Procedure Laterality Date  . Cerebral aneurysm repair    . Abdominal aortic aneurysm repair      Resection  . Aortic valve replacement      With bioprosthetic valve  . Pacemaker placement  08/2007    [employer]; AF permanent; s/p pacemaker for bradycardia with now well controlled ventricular response; Digoxin therapy,  question level; Lower extremity venous issues  . Breast biopsy    . Cholecystectomy    . Vesicovaginal fistula closure w/ tah    . Appendectomy    . Lung biopsy  2005  . Skin cancer removal      ear, face and hand   HPI:  82 yo female adm to [facility] with CHF.  PMH + for dysphagia, PMR, recent pna and cerebral aneurysm.  Pt underwent esophagram 03/2016 with findings of small hiatal hernia, no reflux, presbyesophagus.  Pt reports dysphagia x 3 months = with complaint of choking on liquids but good tolerance of solids.  Dysphagia has been slowly progressive.     Assessment / Plan / Recommendation Clinical Impression  Pt presents with complaint of "choking" on thin liquids x 3 months.   CN exam unremarkable however pt has baseline throat clearing and hoarseness - pt denies frequent coughing with pna.  Whitish coating in buccal regions and on soft palate/faucial pillars noted that did not clear- concerning for oral candidasis.  Cough post=swallow with thin via straw noted - consistent with pt report.  Chin tuck posture tested but not comfortable due to pt's PMR.  Good tolerance of  thin via tsp and straw boluses of nectar without indication of airway compromise.  Care plan in place to mitigate pt's dysphagia symptoms including consuming thin via tsp, nectar via straw and following esophageal precautions.   Advised pt would follow up and hopeful for improvement in symptoms with resolution of probable oral candidiasis.   Thanks for this consult with this most pleasant pt.      Aspiration Risk  Moderate aspiration risk    Diet Recommendation Thin liquid;Nectar-thick liquid;Regular (thin via tsp, straw boluses with nectar)   Medication Administration: Whole meds with liquid (medicine with nectar liquids) Supervision: Patient able to self feed Compensations: Slow rate;Small sips/bites;Follow solids with liquid Postural Changes: Seated upright at 90 degrees;Remain upright for at least 30 minutes after po  intake    Other  Recommendations Oral Care Recommendations: Oral care QID Other Recommendations: Order thickener from pharmacy   Follow up Recommendations       Frequency and Duration min 2x/week  1 week       Prognosis Prognosis for Safe Diet Advancement: Fair Barriers to Reach Goals: Time post onset (x 3 months)      Swallow Study   General Date of Onset: 05/12/16 HPI: 82 yo female adm to [facility] with CHF.  PMH + for dysphagia, PMR, recent pna and cerebral aneurysm.  Pt underwent esophagram 03/2016 with findings of small hiatal hernia, no reflux, presbyesophagus.  Pt reports dysphagia x 3 months = with complaint of choking on liquids but good tolerance of solids.  Dysphagia has been slowly progressive.   Type of Study: Bedside Swallow Evaluation Diet Prior to this Study: Regular;Thin liquids Temperature Spikes Noted: No Respiratory Status: Room air History of Recent Intubation: No Behavior/Cognition: Alert;Cooperative;Pleasant mood Oral Cavity Assessment: Lesions;Other (comment) Oral Care Completed by SLP: Yes (by pt to determine if will clear whitish patches posterior oral cavity, concern for oral candidiasis) Oral Cavity - Dentition: Adequate natural dentition Vision: Functional for self-feeding Self-Feeding Abilities: Able to feed self Patient Positioning: Upright in bed Baseline Vocal Quality: Hoarse (chronic throat clearing at baseline) Volitional Cough: Strong Volitional Swallow: Able to elicit    Oral/Motor/Sensory Function Overall Oral Motor/Sensory Function: Within functional limits   Ice Chips Ice chips: Not tested   Thin Liquid Thin Liquid: Impaired Presentation: Spoon;Straw;Self Fed Pharyngeal  Phase Impairments: Cough - Immediate Other Comments: immediate cough noted post-swallow with thin    Nectar Thick Nectar Thick Liquid: Within functional limits Presentation: Self Fed;Straw   Honey Thick Honey Thick Liquid: Not tested   Puree Puree: Within functional  limits Presentation: Self Fed;Spoon   Solid   GO   Solid: Within functional limits Presentation: Self Fed    Functional Assessment Tool Used: clinical judgement Functional Limitations: Swallowing Swallow Current Status [id number]): At least 20 percent but less than 40 percent impaired, limited or restricted Swallow Goal Status [phone]): At least 1 percent but less than 20 percent impaired, limited or restricted  Luanna Salk, [place] [facility] SLP [phone]

## 2016-05-12 NOTE — Progress Notes (Addendum)
[facility] for Warfarin/Lovenox Indication: Afib, AVR  Allergies  Allergen Reactions  . Fluticasone-Salmeterol Anaphylaxis  . Furosemide Shortness Of Breath  . Iodine Other (See Comments)    Reaction:  Fever and chills   . Iohexol Other (See Comments)    Desc:  Pt requires a 13 hr prep, when doing exam with contrast....fever, chills, dyspnea (slg/04/27/09--given 2 hour solumedrol/ benadryl prep--no reaction).    . Sulfonamide Derivatives Nausea And Vomiting  . Verapamil Nausea And Vomiting and Other (See Comments)    Reaction:  Loss of appetite   . Latex Rash  . Penicillins Rash and Other (See Comments)    Pt unable to answer additional questions about this medication.   Has patient had a PCN reaction causing immediate rash, facial/tongue/throat swelling, SOB or lightheadedness with hypotension: Yes Has patient had a PCN reaction causing severe rash involving mucus membranes or skin necrosis: UnknownHas patient had a PCN reaction that required hospitalization NoHas patient had a PCN reaction occurring within the last 10 years: No If all of the above answers are "NO", then may proceed with Cephalosporin    Patient Measurements: Height: 5\' 1"  (154.9 cm) Weight: 151 lb 3.8 oz (68.6 kg) IBW/kg (Calculated) : 47.8  Vital Signs: Temp: 97.7 F (36.5 C) (06/26 0458) Temp Source: Oral (06/26 0458) BP: 149/70 mmHg (06/26 0458) Pulse Rate: 62 (06/26 0458)  Labs:  Recent Labs  05/10/16 1331 05/11/16 0501 05/11/16 1700 05/11/16 2230 05/12/16 0422  HGB 13.4 12.1  --   --   --   HCT 40.9 38.6  --   --   --   PLT 111* 104*  --   --   --   LABPROT 19.7* 23.1*  --   --  34.1*  INR 1.67* 2.06*  --   --  3.46*  CREATININE 1.05* 1.11*  --   --  1.28*  TROPONINI  --   --  0.03 0.03  --     Estimated Creatinine Clearance: 25.4 mL/min (by C-G formula based on Cr of 1.28).   Medications:  Prescriptions prior to admission  Medication Sig Dispense  Refill Last Dose  . albuterol (PROVENTIL HFA;VENTOLIN HFA) 108 (90 Base) MCG/ACT inhaler Inhale 1 puff into the lungs every 6 (six) hours as needed for wheezing or shortness of breath.   05/10/2016 at 1130  . atenolol (TENORMIN) 100 MG tablet Take 100 mg by mouth at bedtime.    05/09/2016 at 2130  . budesonide-formoterol (SYMBICORT) 80-4.5 MCG/ACT inhaler Inhale 2 puffs into the lungs 2 (two) times daily.   05/10/2016 at 0530  . diclofenac sodium (VOLTAREN) 1 % GEL Apply 4 g topically 2 (two) times daily.   05/09/2016 at Unknown time  . HYDROcodone-acetaminophen (NORCO/VICODIN) 5-325 MG tablet Take 1 tablet by mouth every 6 (six) hours as needed for moderate pain.   over a month ago at Unknown time  . latanoprost (XALATAN) 0.005 % ophthalmic solution Place 1 drop into both eyes at bedtime.   05/09/2016 at Unknown time  . LORazepam (ATIVAN) 0.5 MG tablet Take 0.5 mg by mouth 3 (three) times daily. Scheduled; not PRN.   05/10/2016 at am  . meclizine (ANTIVERT) 25 MG tablet Take 12.5 mg by mouth 3 (three) times daily as needed for dizziness.   Past Month at Unknown time  . ondansetron (ZOFRAN-ODT) 4 MG disintegrating tablet Take 4 mg by mouth every 8 (eight) hours as needed for nausea or vomiting.   05/10/2016 at  1100  . pantoprazole (PROTONIX) 40 MG tablet Take 40 mg by mouth daily.   05/10/2016 at Unknown time  . potassium chloride SA (K-DUR,KLOR-CON) 20 MEQ tablet Take 1 tablet (20 mEq total) by mouth every evening. 90 tablet 3 05/09/2016 at Unknown time  . predniSONE (DELTASONE) 10 MG tablet Take by mouth See admin instructions. Take 5 tablets daily for 2 days, 4 tabs daily for 2 days, 3 tabs daily for 2 days, 2 tabs daily for 2 days, then 1 tab daily for 2 days.   05/10/2016 at Unknown time  . torsemide (DEMADEX) 20 MG tablet Take 20 mg by mouth daily.   05/10/2016 at Unknown time  . traZODone (DESYREL) 50 MG tablet Take 50-100 mg by mouth at bedtime as needed for sleep.    05/09/2016 at Unknown time  .  warfarin (COUMADIN) 5 MG tablet Take 2.5-5 mg by mouth every evening. Take one tablet (5mg ) on Monday and Friday and one-half tablet (2.5mg ) on Tuesday, Wednesday, Thursday, Saturday, and Sunday.   05/09/2016 at 1800  . predniSONE (DELTASONE) 5 MG tablet Take 7.5 mg by mouth every other day. Polymyalgia rheumatica.     [name] torsemide (DEMADEX) 20 MG tablet TAKE 1 TABLET BY MOUTH DAILY (Patient not taking: Reported on 05/10/2016) 30 tablet 6    Scheduled:  . atenolol  100 mg Oral QHS  . enoxaparin (LOVENOX) injection  65 mg Subcutaneous BID  . latanoprost  1 drop Both Eyes QHS  . LORazepam  0.5 mg Oral TID  . mometasone-formoterol  2 puff Inhalation BID  . pantoprazole  40 mg Oral Daily  . potassium chloride SA  20 mEq Oral QPM  . predniSONE  7.5 mg Oral QODAY  . sodium chloride flush  3 mL Intravenous Q12H  . sodium chloride flush  3 mL Intravenous Q12H  . Warfarin - Pharmacist Dosing Inpatient   Does not apply q1800    Assessment: 82 y.o. female with medical history significant of fibrillation, tachybradycardia syndrome, status post cardiac pacemaker, brain aneurysm status post clipping, AAA, aortic valve replacement, chronic diastolic CHF, hepatitis B, postanesthesia seizure, presented with three-week history of shortness of breath. Admitted for CHF +/- asthma exacerbation. Pharmacy consulted to dose warfarin bridged with Lovenox while INR low.   Baseline INR: low  Prior anticoagulation: warfarin 2.5 mg daily except 5 mg on Mon, Fri; LD 6/23  Significant events:  Today, 05/12/2016:  INR - large rate of rise overnight (now above therapeutic goal for this patient)  CBC: none today, 6/25 = Hgb wnl; Plt low but approaching 100k  SCr worsening, CrCl < 22ml/min  Major drug interactions: none  No bleeding issues per nursing  Soft diet - concern for dysphagia per RN note  Goal of Therapy: INR 2.5-3 LMWH Anti-Xa level 0.6-1 drawn 4 hrs after dose  Plan:  Suggest stop enoxaparin  (INR > 3)  Based on INR rate of rise will not give warfarin dose tonight  Daily INR  CBC at least q72 hr while on warfarin  Monitor for signs of bleeding or thrombosis  Doreene Eland, PharmD, BCPS.   Pager: [phone] 05/12/2016 9:37 AM

## 2016-05-12 NOTE — Evaluation (Signed)
Physical Therapy Evaluation Patient Details Name: [name] MRN: [id number] DOB: March 05, 1922 Today's Date: 05/12/2016   History of Present Illness  82 yo female admitted with CHF, acute respiratory failure. hx of A fib, tachybrady syndrome, pacemaker, AAA, CHF, pulm HTN, RA  Clinical Impression  On eval, pt required Min assist for mobility-walked ~75 feet with RW. Dyspnea 2/4. O2 sats 94% on RA at rest after ambulation. Recommend HHPT follow up if pt is agreeable.     Follow Up Recommendations Home health PT;Supervision - Intermittent    Equipment Recommendations  None recommended by PT    Recommendations for Other Services       Precautions / Restrictions Precautions Precautions: Fall Restrictions Weight Bearing Restrictions: No      Mobility  Bed Mobility               General bed mobility comments: oob in recliner  Transfers Overall transfer level: Needs assistance Equipment used: Rolling walker (2 wheeled) Transfers: Sit to/from [facility] Sit to Stand: Min assist Stand pivot transfers: Min assist       General transfer comment: Assist to rise, stabilize, control descent. Pt stood and pivoted with RW to get to BSC-held onto both arms of bsc.   Ambulation/Gait Ambulation/Gait assistance: Min assist Ambulation Distance (Feet): 75 Feet Assistive device: Rolling walker (2 wheeled) Gait Pattern/deviations: Step-through pattern;Decreased stride length     General Gait Details: slow gait speed. Pt reports she has "bad knees". Walked as far as she felt she could tolerate. Dyspnea 2/4.  Stairs            Wheelchair Mobility    Modified Rankin (Stroke Patients Only)       Balance Overall balance assessment: Needs assistance         Standing balance support: Bilateral upper extremity supported;During functional activity Standing balance-Leahy Scale: Poor                               Pertinent Vitals/Pain  Pain Assessment: No/denies pain    Home Living Family/patient expects to be discharged to:: Private residence Living Arrangements:  (niece)   Type of Home: House Home Access: Elevator     Home Layout: Two level;Able to live on main level with bedroom/bathroom Home Equipment: Walker - 4 wheels;Walker - 2 wheels;Wheelchair - [employer];Shower seat      Prior Function Level of Independence: Independent with assistive device(s)         Comments: uses walker.      Hand Dominance        Extremity/Trunk Assessment   Upper Extremity Assessment: Overall WFL for tasks assessed           Lower Extremity Assessment: Generalized weakness      Cervical / Trunk Assessment: Normal  Communication   Communication: No difficulties  Cognition Arousal/Alertness: Awake/alert Behavior During Therapy: WFL for tasks assessed/performed Overall Cognitive Status: Within Functional Limits for tasks assessed                      General Comments      Exercises        Assessment/Plan    PT Assessment Patient needs continued PT services  PT Diagnosis Difficulty walking;Generalized weakness   PT Problem List Decreased strength;Decreased activity tolerance;Decreased balance;Decreased mobility;Pain  PT Treatment Interventions DME instruction;Gait training;Functional mobility training;Therapeutic activities;Patient/family education;Balance training;Therapeutic exercise   PT Goals (Current goals can be  found in the Care Plan section) Acute Rehab PT Goals Patient Stated Goal: home soon PT Goal Formulation: With patient Time For Goal Achievement: 05/26/16 Potential to Achieve Goals: Good    Frequency Min 3X/week   Barriers to discharge        Co-evaluation               End of Session Equipment Utilized During Treatment: Gait belt Activity Tolerance: Patient tolerated treatment well Patient left: in chair;with call bell/phone within reach;with chair  alarm set      Functional Assessment Tool Used: clinical judgement Functional Limitation: Mobility: Walking and moving around Mobility: Walking and Moving Around Current Status [phone]): At least 20 percent but less than 40 percent impaired, limited or restricted Mobility: Walking and Moving Around Goal Status [phone]): At least 1 percent but less than 20 percent impaired, limited or restricted    Time: 1436-1451 PT Time Calculation (min) (ACUTE ONLY): 15 min   Charges:   PT Evaluation $PT Eval Low Complexity: 1 Procedure     PT G Codes:   PT G-Codes **NOT FOR INPATIENT CLASS** Functional Assessment Tool Used: clinical judgement Functional Limitation: Mobility: Walking and moving around Mobility: Walking and Moving Around Current Status [phone]): At least 20 percent but less than 40 percent impaired, limited or restricted Mobility: Walking and Moving Around Goal Status [phone]): At least 1 percent but less than 20 percent impaired, limited or restricted    Weston Anna, MPT Pager: [phone]

## 2016-05-13 DIAGNOSIS — E785 Hyperlipidemia, unspecified: Secondary | ICD-10-CM | POA: Diagnosis not present

## 2016-05-13 DIAGNOSIS — I272 Other secondary pulmonary hypertension: Secondary | ICD-10-CM | POA: Diagnosis not present

## 2016-05-13 DIAGNOSIS — I5031 Acute diastolic (congestive) heart failure: Secondary | ICD-10-CM | POA: Diagnosis not present

## 2016-05-13 DIAGNOSIS — Z7901 Long term (current) use of anticoagulants: Secondary | ICD-10-CM | POA: Diagnosis not present

## 2016-05-13 DIAGNOSIS — Z882 Allergy status to sulfonamides status: Secondary | ICD-10-CM | POA: Diagnosis not present

## 2016-05-13 DIAGNOSIS — M069 Rheumatoid arthritis, unspecified: Secondary | ICD-10-CM | POA: Diagnosis present

## 2016-05-13 DIAGNOSIS — G8929 Other chronic pain: Secondary | ICD-10-CM | POA: Diagnosis present

## 2016-05-13 DIAGNOSIS — R0602 Shortness of breath: Secondary | ICD-10-CM | POA: Diagnosis present

## 2016-05-13 DIAGNOSIS — Z953 Presence of xenogenic heart valve: Secondary | ICD-10-CM | POA: Diagnosis not present

## 2016-05-13 DIAGNOSIS — Z8249 Family history of ischemic heart disease and other diseases of the circulatory system: Secondary | ICD-10-CM | POA: Diagnosis not present

## 2016-05-13 DIAGNOSIS — Z7952 Long term (current) use of systemic steroids: Secondary | ICD-10-CM | POA: Diagnosis not present

## 2016-05-13 DIAGNOSIS — B379 Candidiasis, unspecified: Secondary | ICD-10-CM | POA: Diagnosis not present

## 2016-05-13 DIAGNOSIS — I481 Persistent atrial fibrillation: Secondary | ICD-10-CM | POA: Diagnosis not present

## 2016-05-13 DIAGNOSIS — Z8679 Personal history of other diseases of the circulatory system: Secondary | ICD-10-CM | POA: Diagnosis not present

## 2016-05-13 DIAGNOSIS — Z950 Presence of cardiac pacemaker: Secondary | ICD-10-CM | POA: Diagnosis not present

## 2016-05-13 DIAGNOSIS — I110 Hypertensive heart disease with heart failure: Secondary | ICD-10-CM | POA: Diagnosis not present

## 2016-05-13 DIAGNOSIS — I482 Chronic atrial fibrillation: Secondary | ICD-10-CM | POA: Diagnosis not present

## 2016-05-13 DIAGNOSIS — F419 Anxiety disorder, unspecified: Secondary | ICD-10-CM | POA: Diagnosis present

## 2016-05-13 DIAGNOSIS — I5043 Acute on chronic combined systolic (congestive) and diastolic (congestive) heart failure: Secondary | ICD-10-CM | POA: Diagnosis not present

## 2016-05-13 DIAGNOSIS — G4700 Insomnia, unspecified: Secondary | ICD-10-CM | POA: Diagnosis not present

## 2016-05-13 DIAGNOSIS — Z85828 Personal history of other malignant neoplasm of skin: Secondary | ICD-10-CM | POA: Diagnosis not present

## 2016-05-13 DIAGNOSIS — J9601 Acute respiratory failure with hypoxia: Secondary | ICD-10-CM | POA: Diagnosis not present

## 2016-05-13 DIAGNOSIS — R1310 Dysphagia, unspecified: Secondary | ICD-10-CM | POA: Diagnosis not present

## 2016-05-13 LAB — BASIC METABOLIC PANEL
Anion gap: 8 (ref 5–15)
BUN: 34 mg/dL — AB (ref 6–20)
CO2: 34 mmol/L — ABNORMAL HIGH (ref 22–32)
CREATININE: 1.07 mg/dL — AB (ref 0.44–1.00)
Calcium: 8.7 mg/dL — ABNORMAL LOW (ref 8.9–10.3)
Chloride: 101 mmol/L (ref 101–111)
GFR, EST AFRICAN AMERICAN: 51 mL/min — AB (ref >60)
GFR, EST NON AFRICAN AMERICAN: 44 mL/min — AB (ref >60)
Glucose, Bld: 102 mg/dL — ABNORMAL HIGH (ref 65–99)
POTASSIUM: 4.1 mmol/L (ref 3.5–5.1)
SODIUM: 143 mmol/L (ref 135–145)

## 2016-05-13 LAB — PROTIME-INR
INR: 2.86 — AB (ref 0.00–1.49)
PROTHROMBIN TIME: 29.5 s — AB (ref 11.6–15.2)

## 2016-05-13 MED ORDER — POLYETHYLENE GLYCOL 3350 17 G PO PACK
17.0000 g | PACK | Freq: Every day | ORAL | Status: DC | PRN
  Administered 2016-05-13: 17 g via ORAL
  Filled 2016-05-13: qty 1

## 2016-05-13 MED ORDER — TORSEMIDE 20 MG PO TABS
20.0000 mg | ORAL_TABLET | Freq: Every day | ORAL | Status: DC
  Administered 2016-05-14: 20 mg via ORAL
  Filled 2016-05-13: qty 1

## 2016-05-13 MED ORDER — BUMETANIDE 0.25 MG/ML IJ SOLN
1.0000 mg | Freq: Once | INTRAMUSCULAR | Status: AC
  Administered 2016-05-13: 1 mg via INTRAVENOUS
  Filled 2016-05-13: qty 4

## 2016-05-13 MED ORDER — ATENOLOL 50 MG PO TABS
50.0000 mg | ORAL_TABLET | Freq: Every day | ORAL | Status: DC
  Administered 2016-05-13: 50 mg via ORAL
  Filled 2016-05-13: qty 1

## 2016-05-13 MED ORDER — WARFARIN SODIUM 2.5 MG PO TABS
2.5000 mg | ORAL_TABLET | Freq: Once | ORAL | Status: AC
  Administered 2016-05-13: 2.5 mg via ORAL
  Filled 2016-05-13: qty 1

## 2016-05-13 NOTE — Progress Notes (Signed)
PROGRESS NOTE    [name]  [id number] DOB: 1922/03/28 DOA: 05/10/2016 PCP: [name], MD   Brief Narrative:  82 y.o. female with medical history significant of atrial fibrillation, tachybradycardia syndrome, status post cardiac pacemaker, brain aneurysm status post clipping, AAA, aortic valve replacement, chronic diastolic CHF, hepatitis B, postanesthesia seizure, presented with three-week history of shortness of breath. Initially treated for pneumonia by PCP with only minimal improvement in symptoms. Shortness of breath became significantly worse over the 3 days before admission and reported >10 lbs weight gain from baseline.   Started with ethacrynic acid diuresis given sulfa allergy (on torsemide at home).      Assessment & Plan: Acute on chronic diastolic CHF and pulmonary HTN?, Acute respiratory failure TTE 05/11/16 - Normal EF, mild concentric hypertrophy (diastolic dysfunction?) and PAP of 46.   -Negative 750 cc yesterday, -2L on admission -Repeat bumetanide today then back to home torsemide tomorrow -Monitor I/O, daily weights -O2 as needed to keep sats > 90% -Agree, low suspicion for PE given her Well's score is 0.  She did to go the beach, but the travel was by car with frequent stops.  She has no LE pain or unilateral edema -Possibly home tomorrow if weight nears 139-143 lbs baseline, can ambulate without desats or significant respiratory distress   Atrial fibrillation with SSS (defibrillator in place) -Continue coumadin per pharmacy -Decrease atenolol to 50 mg daily, given age, renal function -D/c Telemetry  Rheumatoid arthritis  -On chronic steroids, continue    Insomnia -Continue home trazodone  HTN BP 140s/70s today, at goal given age -Diuresis as noted above, atenolol 50 mg -Could start ACEi with PCP as outpatient  Chronic pain -Continue home hydrocodone  Anxiety -Continue home ativan -Recommend taper as able -Continue new Buspar  2.5 mg BID, titrate per PCP  Dysphagia -Seen by SLP, appears to have thrush on exam.  She was given options to help her swallowing -Trial of nystatin S/Sw    DVT prophylaxis: Coumadin, INR monitoring Code Status: Full  Family Communication: Daughter at bedside Disposition Plan: Possibly home tomorrow if weight nears 139-143 lbs baseline, can ambulate without desats or significant respiratory distress.  Anxiety a barrier to discharge, but should not delay d/c.  Also swallowing a concern of family, but instructions given by SLP   Consultants:   None  Procedures:   None  Antimicrobials:  None    Subjective: An episode of SOB and tachypnea while in bed this morning, associated with anxiety.  Weight still not at goal.  Leg swelling improved.  No dyspnea currently, no chest pain currently.     Objective: Filed Vitals:   05/12/16 2100 05/13/16 0442 05/13/16 0914 05/13/16 0932  BP: 115/56 146/85    Pulse: 65 79 64   Temp: 97.7 F (36.5 C) 97.9 F (36.6 C)    TempSrc: Oral Oral    Resp: 18 20    Height:      Weight:  68.6 kg (151 lb 3.8 oz)    SpO2: 99% 96% 96% 96%    Intake/Output Summary (Last 24 hours) at 05/13/16 1150 Last data filed at 05/13/16 0957  Gross per 24 hour  Intake    440 ml  Output    750 ml  Net   -310 ml   Filed Weights   05/10/16 2029 05/12/16 0458 05/13/16 0442  Weight: 69.1 kg (152 lb 5.4 oz) 68.6 kg (151 lb 3.8 oz) 68.6 kg (151 lb 3.8 oz)  Examination:  General exam: Appears calm and comfortable, breathing improved, on 2LNC for comfort, O2 sats have been normal  Respiratory system: Crackles in the bases, sound like atelectasis, no wheezing. Respiratory effort normal. Cardiovascular system: S1 & S2 heard, RR, NR. 1+ pitting bilateral LE edema at shins. Gastrointestinal system: Abdomen is nondistended, soft and nontender. Normal bowel sounds heard. Central nervous system: Alert and oriented. No focal neurological deficits.  Cranial nerves  normal grossly. Skin: No rashes, lesions or ulcers noted, chronic venous stasis changes of legs Psychiatry: Judgement and insight appear normal. Mood & affect appropriate.     Data Reviewed:   CBC:  Recent Labs Lab 05/10/16 1331 05/11/16 0501  WBC 6.6 2.6*  NEUTROABS 5.4  --   HGB 13.4 12.1  HCT 40.9 38.6  MCV 91.7 93.7  PLT 111* [id number]*   Basic Metabolic Panel:  Recent Labs Lab 05/10/16 1331 05/11/16 0501 05/12/16 0422 05/13/16 0454  NA 142 141 141 143  K 4.6 4.5 3.7 4.1  CL 102 101 99* 101  CO2 32 33* 36* 34*  GLUCOSE 141* 149* 100* 102*  BUN 31* 32* 35* 34*  CREATININE 1.05* 1.11* 1.28* 1.07*  CALCIUM 9.0 8.8* 8.5* 8.7*   GFR: Estimated Creatinine Clearance: 30.3 mL/min (by C-G formula based on Cr of 1.07). Liver Function Tests:  Recent Labs Lab 05/10/16 1331  AST 34  ALT 19  ALKPHOS 74  BILITOT 1.7*  PROT 6.6  ALBUMIN 3.7   Coagulation Profile:  Recent Labs Lab 05/10/16 1331 05/11/16 0501 05/12/16 0422 05/13/16 0454  INR 1.67* 2.06* 3.46* 2.86*    Radiology Studies: No results found.  Scheduled Meds: . atenolol  100 mg Oral QHS  . busPIRone  2.5 mg Oral BID  . latanoprost  1 drop Both Eyes QHS  . LORazepam  0.5 mg Oral TID  . mometasone-formoterol  2 puff Inhalation BID  . nystatin  5 mL Oral QID  . pantoprazole  40 mg Oral Daily  . potassium chloride SA  20 mEq Oral QPM  . predniSONE  7.5 mg Oral QODAY  . sodium chloride flush  3 mL Intravenous Q12H  . sodium chloride flush  3 mL Intravenous Q12H  . warfarin  2.5 mg Oral ONCE-1800  . Warfarin - Pharmacist Dosing Inpatient   Does not apply q1800   Continuous Infusions:       Time spent: 25 minutes    Edwin Dada, MD [facility] Pager [phone]  If 7PM-7AM, please contact night-coverage [email or web address] Password [facility] 05/13/2016, 11:50 AM

## 2016-05-13 NOTE — Progress Notes (Signed)
SATURATION QUALIFICATIONS: (This note is used to comply with regulatory documentation for home oxygen)  Patient Saturations on Room Air at Rest = 95%  Patient Saturations on Room Air while Ambulating = 92%   

## 2016-05-13 NOTE — Progress Notes (Signed)
[facility] for Warfarin Indication: Afib, AVR  Allergies  Allergen Reactions  . Fluticasone-Salmeterol Anaphylaxis  . Furosemide Shortness Of Breath  . Iodine Other (See Comments)    Reaction:  Fever and chills   . Iohexol Other (See Comments)    Desc:  Pt requires a 13 hr prep, when doing exam with contrast....fever, chills, dyspnea (slg/04/27/09--given 2 hour solumedrol/ benadryl prep--no reaction).    . Sulfonamide Derivatives Nausea And Vomiting  . Verapamil Nausea And Vomiting and Other (See Comments)    Reaction:  Loss of appetite   . Latex Rash  . Penicillins Rash and Other (See Comments)    Pt unable to answer additional questions about this medication.   Has patient had a PCN reaction causing immediate rash, facial/tongue/throat swelling, SOB or lightheadedness with hypotension: Yes Has patient had a PCN reaction causing severe rash involving mucus membranes or skin necrosis: UnknownHas patient had a PCN reaction that required hospitalization NoHas patient had a PCN reaction occurring within the last 10 years: No If all of the above answers are "NO", then may proceed with Cephalosporin    Patient Measurements: Height: 5\' 1"  (154.9 cm) Weight: 151 lb 3.8 oz (68.6 kg) IBW/kg (Calculated) : 47.8  Vital Signs: Temp: 97.9 F (36.6 C) (06/27 0442) Temp Source: Oral (06/27 0442) BP: 146/85 mmHg (06/27 0442) Pulse Rate: 64 (06/27 0914)  Labs:  Recent Labs  05/10/16 1331 05/11/16 0501 05/11/16 1700 05/11/16 2230 05/12/16 0422 05/13/16 0454  HGB 13.4 12.1  --   --   --   --   HCT 40.9 38.6  --   --   --   --   PLT 111* 104*  --   --   --   --   LABPROT 19.7* 23.1*  --   --  34.1* 29.5*  INR 1.67* 2.06*  --   --  3.46* 2.86*  CREATININE 1.05* 1.11*  --   --  1.28* 1.07*  TROPONINI  --   --  0.03 0.03  --   --     Estimated Creatinine Clearance: 30.3 mL/min (by C-G formula based on Cr of 1.07).   Medications:  Prescriptions  prior to admission  Medication Sig Dispense Refill Last Dose  . albuterol (PROVENTIL HFA;VENTOLIN HFA) 108 (90 Base) MCG/ACT inhaler Inhale 1 puff into the lungs every 6 (six) hours as needed for wheezing or shortness of breath.   05/10/2016 at 1130  . atenolol (TENORMIN) 100 MG tablet Take 100 mg by mouth at bedtime.    05/09/2016 at 2130  . budesonide-formoterol (SYMBICORT) 80-4.5 MCG/ACT inhaler Inhale 2 puffs into the lungs 2 (two) times daily.   05/10/2016 at 0530  . diclofenac sodium (VOLTAREN) 1 % GEL Apply 4 g topically 2 (two) times daily.   05/09/2016 at Unknown time  . HYDROcodone-acetaminophen (NORCO/VICODIN) 5-325 MG tablet Take 1 tablet by mouth every 6 (six) hours as needed for moderate pain.   over a month ago at Unknown time  . latanoprost (XALATAN) 0.005 % ophthalmic solution Place 1 drop into both eyes at bedtime.   05/09/2016 at Unknown time  . LORazepam (ATIVAN) 0.5 MG tablet Take 0.5 mg by mouth 3 (three) times daily. Scheduled; not PRN.   05/10/2016 at am  . meclizine (ANTIVERT) 25 MG tablet Take 12.5 mg by mouth 3 (three) times daily as needed for dizziness.   Past Month at Unknown time  . ondansetron (ZOFRAN-ODT) 4 MG disintegrating tablet Take 4  mg by mouth every 8 (eight) hours as needed for nausea or vomiting.   05/10/2016 at 1100  . pantoprazole (PROTONIX) 40 MG tablet Take 40 mg by mouth daily.   05/10/2016 at Unknown time  . potassium chloride SA (K-DUR,KLOR-CON) 20 MEQ tablet Take 1 tablet (20 mEq total) by mouth every evening. 90 tablet 3 05/09/2016 at Unknown time  . predniSONE (DELTASONE) 10 MG tablet Take by mouth See admin instructions. Take 5 tablets daily for 2 days, 4 tabs daily for 2 days, 3 tabs daily for 2 days, 2 tabs daily for 2 days, then 1 tab daily for 2 days.   05/10/2016 at Unknown time  . torsemide (DEMADEX) 20 MG tablet Take 20 mg by mouth daily.   05/10/2016 at Unknown time  . traZODone (DESYREL) 50 MG tablet Take 50-100 mg by mouth at bedtime as needed for  sleep.    05/09/2016 at Unknown time  . warfarin (COUMADIN) 5 MG tablet Take 2.5-5 mg by mouth every evening. Take one tablet (5mg ) on Monday and Friday and one-half tablet (2.5mg ) on Tuesday, Wednesday, Thursday, Saturday, and Sunday.   05/09/2016 at 1800  . predniSONE (DELTASONE) 5 MG tablet Take 7.5 mg by mouth every other day. Polymyalgia rheumatica.     [name] torsemide (DEMADEX) 20 MG tablet TAKE 1 TABLET BY MOUTH DAILY (Patient not taking: Reported on 05/10/2016) 30 tablet 6    Scheduled:  . atenolol  100 mg Oral QHS  . busPIRone  2.5 mg Oral BID  . latanoprost  1 drop Both Eyes QHS  . LORazepam  0.5 mg Oral TID  . mometasone-formoterol  2 puff Inhalation BID  . nystatin  5 mL Oral QID  . pantoprazole  40 mg Oral Daily  . potassium chloride SA  20 mEq Oral QPM  . predniSONE  7.5 mg Oral QODAY  . sodium chloride flush  3 mL Intravenous Q12H  . sodium chloride flush  3 mL Intravenous Q12H  . Warfarin - Pharmacist Dosing Inpatient   Does not apply q1800    Assessment: 82 y.o. female with medical history significant of fibrillation, tachybradycardia syndrome, status post cardiac pacemaker, brain aneurysm status post clipping, AAA, aortic valve replacement, chronic diastolic CHF, hepatitis B, postanesthesia seizure, presented with three-week history of shortness of breath. Admitted for CHF +/- asthma exacerbation. Pharmacy consulted to dose warfarin bridged with Lovenox while INR low.   Baseline INR: low  Prior anticoagulation: warfarin 2.5 mg daily except 5 mg on Mon, Fri; LD 6/23  Significant events:  Today, 05/13/2016:  INR therapeutic after no warfarin given yesterday  CBC: none today, 6/25 = Hgb wnl; Plt low but stable, checking CBC tomorrow  SCr improved, CrCl now around 30  Major drug interactions: none  No bleeding issues per nursing  Soft diet - concern for dysphagia per RN note  Goal of Therapy: INR 2.5-3 LMWH Anti-Xa level 0.6-1 drawn 4 hrs after  dose  Plan:  Warfarin 2.5mg  today at 6pm  Daily INR  CBC at least q72 hr while on warfarin  Monitor for signs of bleeding or thrombosis  Adrian Saran, PharmD, BCPS Pager [phone] 05/13/2016 9:33 AM

## 2016-05-14 DIAGNOSIS — I5033 Acute on chronic diastolic (congestive) heart failure: Secondary | ICD-10-CM | POA: Insufficient documentation

## 2016-05-14 LAB — CBC
HCT: 38.7 % (ref 36.0–46.0)
HEMOGLOBIN: 12.3 g/dL (ref 12.0–15.0)
MCH: 29.4 pg (ref 26.0–34.0)
MCHC: 31.8 g/dL (ref 30.0–36.0)
MCV: 92.4 fL (ref 78.0–100.0)
PLATELETS: 96 10*3/uL — AB (ref 150–400)
RBC: 4.19 MIL/uL (ref 3.87–5.11)
RDW: 14.2 % (ref 11.5–15.5)
WBC: 3.9 10*3/uL — AB (ref 4.0–10.5)

## 2016-05-14 LAB — PROTIME-INR
INR: 2.47 — ABNORMAL HIGH (ref 0.00–1.49)
PROTHROMBIN TIME: 25.6 s — AB (ref 11.6–15.2)

## 2016-05-14 MED ORDER — NYSTATIN 100000 UNIT/ML MT SUSP: 5.0000 mL | Freq: Four times a day (QID) | OROMUCOSAL | Status: AC

## 2016-05-14 MED ORDER — ATENOLOL 100 MG PO TABS: 50.0000 mg | ORAL_TABLET | Freq: Every day | ORAL | Status: DC

## 2016-05-14 MED ORDER — LORAZEPAM 0.5 MG PO TABS: 0.5000 mg | ORAL_TABLET | Freq: Two times a day (BID) | ORAL | Status: DC

## 2016-05-14 MED ORDER — WARFARIN SODIUM 4 MG PO TABS
4.0000 mg | ORAL_TABLET | Freq: Once | ORAL | Status: DC
  Filled 2016-05-14: qty 1

## 2016-05-14 MED ORDER — TORSEMIDE 20 MG PO TABS: 30.0000 mg | ORAL_TABLET | Freq: Every day | ORAL | Status: DC

## 2016-05-14 MED ORDER — BUSPIRONE HCL 5 MG PO TABS: 2.5000 mg | ORAL_TABLET | Freq: Two times a day (BID) | ORAL | Status: DC

## 2016-05-14 MED ORDER — RESOURCE THICKENUP CLEAR PO POWD: ORAL | Status: DC

## 2016-05-14 NOTE — Progress Notes (Signed)
Spoke with pt and dtg at bedside concerning [place]. Both selected not to go back with [facility] and asked for another agency. Alvis Lemmings [facility] was selected. Referral called to in house rep.

## 2016-05-14 NOTE — Progress Notes (Signed)
Speech Language Pathology Treatment: Dysphagia  Patient Details Name: [name] MRN: [id number] DOB: 1922-01-18 Today's Date: 05/14/2016 Time: 1130-1201 SLP Time Calculation (min) (ACUTE ONLY): 31 min  Assessment / Plan / Recommendation Clinical Impression  Pt to discharge today therefore education re: her chronic dysphagia took place with pt and her niece.  Niece offered further information re: pt's frequent coughing with liquids  - stating it occurs less when pt is drinking while consuming a meal than if only drinking a small single bolus.  In addition, pt also complained of xerostomia, frequent belching, globus and choking on liquids x 2-3 months.  She does report improved tolerance with use of thickener in her liquids.  SlP reviewed extensively xerostomia compensations, GERD precautions given h/o being on a PPI for "years" and dysphagia mitigation strategies.  Advised pt to monitor for improvement as she recovers from acute illness and contact MD re: symptoms if they do not resolve.  Thanks for allowing me to help care for this most pleasant pt.      HPI HPI: 82 yo female adm to [facility] with CHF.  PMH + for dysphagia, PMR, recent pna and cerebral aneurysm.  Pt underwent esophagram 03/2016 with findings of small hiatal hernia, no reflux, presbyesophagus.  Pt reports dysphagia x 3 months = with complaint of choking on liquids but good tolerance of solids.  Dysphagia has been slowly progressive.        SLP Plan  All goals met     Recommendations  Diet recommendations: Regular;Thin liquid;Nectar-thick liquid Liquids provided via: Teaspoon;Straw Medication Administration: Whole meds with liquid (medicine with nectar liquids) Supervision: Patient able to self feed Compensations: Slow rate;Small sips/bites;Follow solids with liquid             Oral Care Recommendations: Oral care BID Follow up Recommendations: None Plan: All goals met     [place], [place] [facility]  Sarah Ann

## 2016-05-14 NOTE — Discharge Summary (Signed)
Physician Discharge Summary  [name] [id number] DOB: [birth year] DOA: 05/10/2016  PCP: [name] Napoleon, MD  Admit date: 05/10/2016 Discharge date: 05/14/2016  Time spent: 35 minutes  Recommendations for Outpatient Follow-up:  Please repeat BMEt to follow up electrolytes and renal function Reassess BP and adjust medications as needed  Follow INR and adjust coumadin dose if further required  Continue tapering ativan to off and adjust Buspar.  Discharge Diagnoses:  Active Problems:   ATRIAL FIBRILLATION   Hyperlipidemia   CHF (congestive heart failure) (HCC)   Acute respiratory failure (HCC)   Acute diastolic (congestive) heart failure (HCC)   Rheumatoid arthritis ([place])   Insomnia   Discharge Condition: stable and improved. Discharge home with [facility] services for PT and RN. [name] will follow upw ith PCP in 1 week.  Diet recommendation: low sodium diet (less than 2 gram daily); regular solids and nectar thick liquids.  Filed Weights   05/12/16 0458 05/13/16 0442 05/14/16 0553  Weight: 68.6 kg (151 lb 3.8 oz) 68.6 kg (151 lb 3.8 oz) 67.7 kg (149 lb 4 oz)    History of present illness:  82 y.o. female with medical history significant of atrial fibrillation, tachybradycardia syndrome, status post cardiac pacemaker, brain aneurysm status post clipping, AAA, aortic valve replacement, chronic diastolic CHF, hepatitis B, postanesthesia seizure, presented with three-week history of shortness of breath. Initially treated for pneumonia by PCP with only minimal improvement in symptoms. Shortness of breath became significantly worse over the 3 days before admission and reported >10 lbs weight gain from baseline.   Hospital Course:  Acute on chronic diastolic CHF and pulmonary HTN?, Acute respiratory failure TTE 05/11/16 - Normal EF, mild concentric hypertrophy (diastolic dysfunction?) and PAP of 46.  -Negative 750 cc yesterday, -2.7L during hsopitalization -will discharge on  torsemide 30mg  daily -instructions given to check daily weights -Agree, low suspicion for PE given her Well's score is 0. She did to go the beach, but the travel was by car with frequent stops. She has no LE pain or unilateral edema and her coumadin has remain therapeutic. -follow up with PCP in 1 week -advise to follow low sodium diet   Atrial fibrillation with SSS (defibrillator in place) -Continue coumadin per pharmacy -Decrease atenolol to 50 mg daily, given age, soft BP and bradycardia at night  Rheumatoid arthritis  -On chronic steroids, continue   Insomnia -Continue home trazodone  HTN Stable -given bradycardia and low BP, atenolol decrease to 50mg  daily -advise to follow low sodium diet -cotninue rest on antihypertensive medications  Chronic pain -Continue home hydrocodone as needed for pain  Anxiety -Continue tapering process for home ativan -Continue new Buspar 2.5 mg BID, titrate per PCP as needed   Dysphagia/trhush  -Seen by SLP, discharge with instruction to maximize swallowing and decrease aspiration -discharge on nectar thick liquids and regular solids consistency  -will complete 4 more days of nystatin; thrush is significant improved   Procedures:  See below for x-ray reports   Consultations:  None   Discharge Exam: Filed Vitals:   05/13/16 2235 05/14/16 0553  BP: 137/71 137/62  Pulse: 66 59  Temp: 97.6 F (36.4 C) 97.6 F (36.4 C)  Resp: 20 18   General exam: Appears calm and comfortable, breathing much improved, good O2 sats on RA. No signs of fluid overload on exam.  Respiratory system: no crackles or wheezing on exam. Respiratory effort normal. Cardiovascular system: S1 & S2 heard, RR, NR. No edema, no JVD. Gastrointestinal system: Abdomen  is nondistended, soft and nontender. Normal bowel sounds heard. Central nervous system: Alert and oriented. No focal neurological deficits. Cranial nerves normal grossly. Skin: No rashes, lesions  or ulcers noted, chronic venous stasis changes of legs Psychiatry: Judgement and insight appear normal. Mood & affect appropriate.    Discharge Instructions   Discharge Instructions    Diet - low sodium heart healthy    Complete by:  As directed      Discharge instructions    Complete by:  As directed   Follow up with PCP in 1 week Please take medications as prescribed Check weight on daily basis (contact PCP or cardiology office if you have gained more than 3 pounds overnight and/or more than 5 pounds in a week) Follow low sodium diet (less than 2 gram daily) Maintain adequate hydration          Current Discharge Medication List    START taking these medications   Details  busPIRone (BUSPAR) 5 MG tablet Take 0.5 tablets (2.5 mg total) by mouth 2 (two) times daily. Qty: 30 tablet, Refills: 0    Maltodextrin-Xanthan Gum (RESOURCE THICKENUP CLEAR) POWD Use as needed to achieve nectar thick consistency in liquids Qty: 125 g, Refills: 0    nystatin (MYCOSTATIN) 100000 UNIT/ML suspension Take 5 mLs (500,000 Units total) by mouth 4 (four) times daily. Qty: 72 mL, Refills: 0      CONTINUE these medications which have CHANGED   Details  atenolol (TENORMIN) 100 MG tablet Take 0.5 tablets (50 mg total) by mouth at bedtime.    LORazepam (ATIVAN) 0.5 MG tablet Take 1 tablet (0.5 mg total) by mouth 2 (two) times daily.    torsemide (DEMADEX) 20 MG tablet Take 1.5 tablets (30 mg total) by mouth daily. Qty: 45 tablet, Refills: 1      CONTINUE these medications which have NOT CHANGED   Details  albuterol (PROVENTIL HFA;VENTOLIN HFA) 108 (90 Base) MCG/ACT inhaler Inhale 1 puff into the lungs every 6 (six) hours as needed for wheezing or shortness of breath.    budesonide-formoterol (SYMBICORT) 80-4.5 MCG/ACT inhaler Inhale 2 puffs into the lungs 2 (two) times daily.    diclofenac sodium (VOLTAREN) 1 % GEL Apply 4 g topically 2 (two) times daily.    HYDROcodone-acetaminophen  (NORCO/VICODIN) 5-325 MG tablet Take 1 tablet by mouth every 6 (six) hours as needed for moderate pain.    latanoprost (XALATAN) 0.005 % ophthalmic solution Place 1 drop into both eyes at bedtime.    meclizine (ANTIVERT) 25 MG tablet Take 12.5 mg by mouth 3 (three) times daily as needed for dizziness.    ondansetron (ZOFRAN-ODT) 4 MG disintegrating tablet Take 4 mg by mouth every 8 (eight) hours as needed for nausea or vomiting.    pantoprazole (PROTONIX) 40 MG tablet Take 40 mg by mouth daily.    potassium chloride SA (K-DUR,KLOR-CON) 20 MEQ tablet Take 1 tablet (20 mEq total) by mouth every evening. Qty: 90 tablet, Refills: 3    traZODone (DESYREL) 50 MG tablet Take 50-100 mg by mouth at bedtime as needed for sleep.     warfarin (COUMADIN) 5 MG tablet Take 2.5-5 mg by mouth every evening. Take one tablet (5mg ) on Monday and Friday and one-half tablet (2.5mg ) on Tuesday, Wednesday, Thursday, Saturday, and Sunday.    predniSONE (DELTASONE) 5 MG tablet Take 7.5 mg by mouth every other day. Polymyalgia rheumatica.       Allergies  Allergen Reactions  . Fluticasone-Salmeterol Anaphylaxis  . Furosemide Shortness  Of Breath  . Iodine Other (See Comments)    Reaction:  Fever and chills   . Iohexol Other (See Comments)    Desc:  Pt requires a 13 hr prep, when doing exam with contrast....fever, chills, dyspnea (slg/04/27/09--given 2 hour solumedrol/ benadryl prep--no reaction).    . Sulfonamide Derivatives Nausea And Vomiting  . Verapamil Nausea And Vomiting and Other (See Comments)    Reaction:  Loss of appetite   . Latex Rash  . Penicillins Rash and Other (See Comments)    Pt unable to answer additional questions about this medication.   Has [name] had a PCN reaction causing immediate rash, facial/tongue/throat swelling, SOB or lightheadedness with hypotension: Yes Has [name] had a PCN reaction causing severe rash involving mucus membranes or skin necrosis: UnknownHas [name] had a PCN  reaction that required hospitalization NoHas [name] had a PCN reaction occurring within the last 10 years: No If all of the above answers are "NO", then may proceed with Cephalosporin   Follow-up Information    Follow up with TEJAN-SIE, Clotilde Dieter, MD. Schedule an appointment as soon as possible for a visit in 1 week.   Specialty:  Internal Medicine   Contact information:   [place] [address] [phone]        The results of significant diagnostics from this hospitalization (including imaging, microbiology, ancillary and laboratory) are listed below for reference.    Significant Diagnostic Studies: Dg Chest 2 View  05/10/2016  CLINICAL DATA:  Shortness of breath and recent pneumonia. EXAM: CHEST  2 VIEW COMPARISON:  Seven/under size and 1,016 FINDINGS: Stable cardiac enlargement and appearance of single chamber pacemaker. Stable aortic atherosclerosis with aneurysmal disease of the descending thoracic aorta. There are small bilateral pleural effusions, right greater than left. No overt edema or infiltrate. IMPRESSION: Small bilateral pleural effusions without overt edema or infiltrate. Stable aortic atherosclerosis and aneurysmal disease of the descending thoracic aorta. Electronically Signed   By: Aletta Edouard M.D.   On: 05/10/2016 14:43    Microbiology: No results found for this or any previous visit (from the past 240 hour(s)).   Labs: Basic Metabolic Panel:  Recent Labs Lab 05/10/16 1331 05/11/16 0501 05/12/16 0422 05/13/16 0454  NA 142 141 141 143  K 4.6 4.5 3.7 4.1  CL 102 101 99* 101  CO2 32 33* 36* 34*  GLUCOSE 141* 149* 100* 102*  BUN 31* 32* 35* 34*  CREATININE 1.05* 1.11* 1.28* 1.07*  CALCIUM 9.0 8.8* 8.5* 8.7*   Liver Function Tests:  Recent Labs Lab 05/10/16 1331  AST 34  ALT 19  ALKPHOS 74  BILITOT 1.7*  PROT 6.6  ALBUMIN 3.7   CBC:  Recent Labs Lab 05/10/16 1331 05/11/16 0501 05/14/16 0508  WBC 6.6 2.6* 3.9*   NEUTROABS 5.4  --   --   HGB 13.4 12.1 12.3  HCT 40.9 38.6 38.7  MCV 91.7 93.7 92.4  PLT 111* 104* 96*   Cardiac Enzymes:  Recent Labs Lab 05/11/16 1700 05/11/16 2230  TROPONINI 0.03 0.03   BNP: BNP (last 3 results)  Recent Labs  06/09/15 1446 05/10/16 1354  BNP 365.6* 216.8*   Signed:  Barton Dubois MD.  [facility] 05/14/2016, 1:48 PM

## 2016-05-14 NOTE — Progress Notes (Signed)
Completed  D/C teaching with patient. Answered all questions. Pt will be D.C home with family in stable condition. 

## 2016-05-14 NOTE — Progress Notes (Signed)
[place] for Warfarin Indication: Afib, AVR  Allergies  Allergen Reactions  . Fluticasone-Salmeterol Anaphylaxis  . Furosemide Shortness Of Breath  . Iodine Other (See Comments)    Reaction:  Fever and chills   . Iohexol Other (See Comments)    Desc:  Pt requires a 13 hr prep, when doing exam with contrast....fever, chills, dyspnea (slg/04/27/09--given 2 hour solumedrol/ benadryl prep--no reaction).    . Sulfonamide Derivatives Nausea And Vomiting  . Verapamil Nausea And Vomiting and Other (See Comments)    Reaction:  Loss of appetite   . Latex Rash  . Penicillins Rash and Other (See Comments)    Pt unable to answer additional questions about this medication.   Has patient had a PCN reaction causing immediate rash, facial/tongue/throat swelling, SOB or lightheadedness with hypotension: Yes Has patient had a PCN reaction causing severe rash involving mucus membranes or skin necrosis: UnknownHas patient had a PCN reaction that required hospitalization NoHas patient had a PCN reaction occurring within the last 10 years: No If all of the above answers are "NO", then may proceed with Cephalosporin    Patient Measurements: Height: 5\' 1"  (154.9 cm) Weight: 149 lb 4 oz (67.7 kg) IBW/kg (Calculated) : 47.8  Vital Signs: Temp: 97.6 F (36.4 C) (06/28 0553) Temp Source: Oral (06/28 0553) BP: 137/62 mmHg (06/28 0553) Pulse Rate: 59 (06/28 0553)  Labs:  Recent Labs  05/11/16 1700 05/11/16 2230 05/12/16 0422 05/13/16 0454 05/14/16 0508  HGB  --   --   --   --  12.3  HCT  --   --   --   --  38.7  PLT  --   --   --   --  96*  LABPROT  --   --  34.1* 29.5* 25.6*  INR  --   --  3.46* 2.86* 2.47*  CREATININE  --   --  1.28* 1.07*  --   TROPONINI 0.03 0.03  --   --   --     Estimated Creatinine Clearance: 30.2 mL/min (by C-G formula based on Cr of 1.07).   Medications:  Prescriptions prior to admission  Medication Sig Dispense Refill Last  Dose  . albuterol (PROVENTIL HFA;VENTOLIN HFA) 108 (90 Base) MCG/ACT inhaler Inhale 1 puff into the lungs every 6 (six) hours as needed for wheezing or shortness of breath.   05/10/2016 at 1130  . atenolol (TENORMIN) 100 MG tablet Take 100 mg by mouth at bedtime.    05/09/2016 at 2130  . budesonide-formoterol (SYMBICORT) 80-4.5 MCG/ACT inhaler Inhale 2 puffs into the lungs 2 (two) times daily.   05/10/2016 at 0530  . diclofenac sodium (VOLTAREN) 1 % GEL Apply 4 g topically 2 (two) times daily.   05/09/2016 at Unknown time  . HYDROcodone-acetaminophen (NORCO/VICODIN) 5-325 MG tablet Take 1 tablet by mouth every 6 (six) hours as needed for moderate pain.   over a month ago at Unknown time  . latanoprost (XALATAN) 0.005 % ophthalmic solution Place 1 drop into both eyes at bedtime.   05/09/2016 at Unknown time  . LORazepam (ATIVAN) 0.5 MG tablet Take 0.5 mg by mouth 3 (three) times daily. Scheduled; not PRN.   05/10/2016 at am  . meclizine (ANTIVERT) 25 MG tablet Take 12.5 mg by mouth 3 (three) times daily as needed for dizziness.   Past Month at Unknown time  . ondansetron (ZOFRAN-ODT) 4 MG disintegrating tablet Take 4 mg by mouth every 8 (eight) hours as needed  for nausea or vomiting.   05/10/2016 at 1100  . pantoprazole (PROTONIX) 40 MG tablet Take 40 mg by mouth daily.   05/10/2016 at Unknown time  . potassium chloride SA (K-DUR,KLOR-CON) 20 MEQ tablet Take 1 tablet (20 mEq total) by mouth every evening. 90 tablet 3 05/09/2016 at Unknown time  . predniSONE (DELTASONE) 10 MG tablet Take by mouth See admin instructions. Take 5 tablets daily for 2 days, 4 tabs daily for 2 days, 3 tabs daily for 2 days, 2 tabs daily for 2 days, then 1 tab daily for 2 days.   05/10/2016 at Unknown time  . torsemide (DEMADEX) 20 MG tablet Take 20 mg by mouth daily.   05/10/2016 at Unknown time  . traZODone (DESYREL) 50 MG tablet Take 50-100 mg by mouth at bedtime as needed for sleep.    05/09/2016 at Unknown time  . warfarin (COUMADIN)  5 MG tablet Take 2.5-5 mg by mouth every evening. Take one tablet (5mg ) on Monday and Friday and one-half tablet (2.5mg ) on Tuesday, Wednesday, Thursday, Saturday, and Sunday.   05/09/2016 at 1800  . predniSONE (DELTASONE) 5 MG tablet Take 7.5 mg by mouth every other day. Polymyalgia rheumatica.     [name] torsemide (DEMADEX) 20 MG tablet TAKE 1 TABLET BY MOUTH DAILY (Patient not taking: Reported on 05/10/2016) 30 tablet 6    Scheduled:  . atenolol  50 mg Oral QHS  . busPIRone  2.5 mg Oral BID  . latanoprost  1 drop Both Eyes QHS  . LORazepam  0.5 mg Oral TID  . mometasone-formoterol  2 puff Inhalation BID  . nystatin  5 mL Oral QID  . pantoprazole  40 mg Oral Daily  . potassium chloride SA  20 mEq Oral QPM  . predniSONE  7.5 mg Oral QODAY  . sodium chloride flush  3 mL Intravenous Q12H  . sodium chloride flush  3 mL Intravenous Q12H  . torsemide  20 mg Oral Daily  . Warfarin - Pharmacist Dosing Inpatient   Does not apply q1800    Assessment: 82 y.o. female with medical history significant of fibrillation, tachybradycardia syndrome, status post cardiac pacemaker, brain aneurysm status post clipping, AAA, aortic valve replacement, chronic diastolic CHF, hepatitis B, postanesthesia seizure, presented with three-week history of shortness of breath. Admitted for CHF +/- asthma exacerbation. Pharmacy consulted to dose warfarin bridged with Lovenox while INR low.   Baseline INR: low  Prior anticoagulation: warfarin 2.5 mg daily except 5 mg on Mon, Fri; LD 6/23  Significant events:  Today, 05/14/2016:  INR just slightly subtherapeutic after no warfarin dose 6/26 and 2.5mg  on 6/27  CBC: HGb stable, Plts down slightly to 96k  SCr improved, CrCl now around 30  Major drug interactions: none  No bleeding issues per nursing  Soft diet - concern for dysphagia per RN note  Goal of Therapy: INR 2.5-3 LMWH Anti-Xa level 0.6-1 drawn 4 hrs after dose  Plan:  INR dropped some likely due to no  warfarin 2 days ago - will give slightly higher dose today of 4mg  to hopefully prevent further drop in next couple of days  Daily INR  CBC at least q72 hr while on warfarin  Monitor for signs of bleeding or thrombosis  Adrian Saran, PharmD, BCPS Pager [phone] 05/14/2016 8:35 AM

## 2016-05-16 ENCOUNTER — Telehealth: Payer: Self-pay | Admitting: Cardiovascular Disease

## 2016-05-16 NOTE — Telephone Encounter (Signed)
home health nurse Jenny Reichmann calling asking if she needs to draw coumadin today  Pt was in hospital until today  Pt is coming next week for coumadin in office. Please advise

## 2016-05-16 NOTE — Telephone Encounter (Signed)
Returned a call to [place] with [facility] and gave orders to recheck pt's INR next week. Also, [place] CVRR number given to call results to after obtaining INR.

## 2016-05-22 ENCOUNTER — Encounter: Payer: Self-pay | Admitting: Cardiology

## 2016-05-23 ENCOUNTER — Ambulatory Visit (INDEPENDENT_AMBULATORY_CARE_PROVIDER_SITE_OTHER): Payer: Medicare Other | Admitting: Cardiovascular Disease

## 2016-05-23 DIAGNOSIS — I481 Persistent atrial fibrillation: Secondary | ICD-10-CM

## 2016-05-23 DIAGNOSIS — I4819 Other persistent atrial fibrillation: Secondary | ICD-10-CM

## 2016-05-23 DIAGNOSIS — Z953 Presence of xenogenic heart valve: Secondary | ICD-10-CM

## 2016-05-23 DIAGNOSIS — Z5181 Encounter for therapeutic drug level monitoring: Secondary | ICD-10-CM

## 2016-05-23 DIAGNOSIS — Z954 Presence of other heart-valve replacement: Secondary | ICD-10-CM

## 2016-05-23 LAB — POCT INR: INR: 1.3

## 2016-05-30 ENCOUNTER — Telehealth: Payer: Self-pay | Admitting: Cardiovascular Disease

## 2016-05-30 ENCOUNTER — Ambulatory Visit (INDEPENDENT_AMBULATORY_CARE_PROVIDER_SITE_OTHER): Payer: Medicare Other | Admitting: Cardiology

## 2016-05-30 DIAGNOSIS — Z5181 Encounter for therapeutic drug level monitoring: Secondary | ICD-10-CM

## 2016-05-30 DIAGNOSIS — I4819 Other persistent atrial fibrillation: Secondary | ICD-10-CM

## 2016-05-30 DIAGNOSIS — Z954 Presence of other heart-valve replacement: Secondary | ICD-10-CM

## 2016-05-30 DIAGNOSIS — I481 Persistent atrial fibrillation: Secondary | ICD-10-CM

## 2016-05-30 DIAGNOSIS — Z953 Presence of xenogenic heart valve: Secondary | ICD-10-CM

## 2016-05-30 LAB — POCT INR: INR: 1.9

## 2016-05-30 NOTE — Telephone Encounter (Signed)
Home health nurse called to discuss patients symptoms. She has some swelling noted to her ankles and feet and wanted to know if she could take an extra dose of torsemide. Jenny Reichmann states that the patient has also had a 4 pound weight gain. She states that she didn't know when she called previously that the patient had taken both ativan and phenergan earlier and therefore she feels the dizziness and low blood pressure may be from that. She reports that she ambulated the patient and her blood pressure was then 110/65 after walking and that the patient takes her atenolol at bedtime. Instructed Jenny Reichmann that per her previous office visit it states that she can take additional doses as needed for symptoms and therefore she could take a dose if needed. Instructed her to give [place] a call back if symptoms do not improve. She verbalized understanding of our conversation and had no further questions at this time.

## 2016-05-30 NOTE — Telephone Encounter (Signed)
Pt c/o swelling: STAT is pt has developed SOB within 24 hours  1. How long have you been experiencing swelling? Two days  2. Where is the swelling located? Ankles and feet  3.  Are you currently taking a "fluid pill"? Yes  4.  Are you currently SOB? No but dizzy and vomiting one time. BP 98/42 p 62 05/30/16 @ 11:30  5.  Have you traveled recently? No  Laney Pastor w/ Home Health # [phone]

## 2016-06-02 ENCOUNTER — Telehealth: Payer: Self-pay | Admitting: Cardiovascular Disease

## 2016-06-02 NOTE — Telephone Encounter (Signed)
Left message for Cindy to call back.

## 2016-06-02 NOTE — Telephone Encounter (Signed)
[facility] nurse called, states pt was advised (see previous phone note) to take an extra Lasix, states pt has had increased SOB, states on 7/16, there was no weight loss and increased swelling. Please call.

## 2016-06-06 ENCOUNTER — Ambulatory Visit (INDEPENDENT_AMBULATORY_CARE_PROVIDER_SITE_OTHER): Payer: Medicare Other | Admitting: Cardiology

## 2016-06-06 DIAGNOSIS — I481 Persistent atrial fibrillation: Secondary | ICD-10-CM

## 2016-06-06 DIAGNOSIS — Z5181 Encounter for therapeutic drug level monitoring: Secondary | ICD-10-CM

## 2016-06-06 DIAGNOSIS — Z953 Presence of xenogenic heart valve: Secondary | ICD-10-CM

## 2016-06-06 DIAGNOSIS — I4819 Other persistent atrial fibrillation: Secondary | ICD-10-CM

## 2016-06-06 DIAGNOSIS — Z954 Presence of other heart-valve replacement: Secondary | ICD-10-CM

## 2016-06-06 LAB — POCT INR: INR: 2

## 2016-06-07 ENCOUNTER — Emergency Department (HOSPITAL_COMMUNITY): Payer: Medicare Other

## 2016-06-07 ENCOUNTER — Encounter (HOSPITAL_COMMUNITY): Payer: Self-pay | Admitting: Emergency Medicine

## 2016-06-07 ENCOUNTER — Emergency Department (HOSPITAL_COMMUNITY)
Admission: EM | Admit: 2016-06-07 | Discharge: 2016-06-07 | Disposition: A | Discharge status: Home / Self Care | Payer: Medicare Other | Attending: Emergency Medicine | Admitting: Emergency Medicine

## 2016-06-07 DIAGNOSIS — R0902 Hypoxemia: Secondary | ICD-10-CM | POA: Diagnosis not present

## 2016-06-07 DIAGNOSIS — Z85828 Personal history of other malignant neoplasm of skin: Secondary | ICD-10-CM | POA: Diagnosis not present

## 2016-06-07 DIAGNOSIS — I110 Hypertensive heart disease with heart failure: Secondary | ICD-10-CM | POA: Diagnosis not present

## 2016-06-07 DIAGNOSIS — I4891 Unspecified atrial fibrillation: Secondary | ICD-10-CM | POA: Insufficient documentation

## 2016-06-07 DIAGNOSIS — Z7951 Long term (current) use of inhaled steroids: Secondary | ICD-10-CM | POA: Insufficient documentation

## 2016-06-07 DIAGNOSIS — R1031 Right lower quadrant pain: Secondary | ICD-10-CM | POA: Diagnosis present

## 2016-06-07 DIAGNOSIS — R0602 Shortness of breath: Secondary | ICD-10-CM | POA: Insufficient documentation

## 2016-06-07 DIAGNOSIS — Z7901 Long term (current) use of anticoagulants: Secondary | ICD-10-CM | POA: Diagnosis not present

## 2016-06-07 DIAGNOSIS — I5032 Chronic diastolic (congestive) heart failure: Secondary | ICD-10-CM | POA: Diagnosis not present

## 2016-06-07 DIAGNOSIS — R300 Dysuria: Secondary | ICD-10-CM | POA: Diagnosis not present

## 2016-06-07 DIAGNOSIS — R109 Unspecified abdominal pain: Secondary | ICD-10-CM

## 2016-06-07 DIAGNOSIS — Z79899 Other long term (current) drug therapy: Secondary | ICD-10-CM | POA: Diagnosis not present

## 2016-06-07 DIAGNOSIS — Z791 Long term (current) use of non-steroidal anti-inflammatories (NSAID): Secondary | ICD-10-CM | POA: Insufficient documentation

## 2016-06-07 DIAGNOSIS — I719 Aortic aneurysm of unspecified site, without rupture: Secondary | ICD-10-CM

## 2016-06-07 LAB — URINE MICROSCOPIC-ADD ON

## 2016-06-07 LAB — CBC WITH DIFFERENTIAL/PLATELET
BASOS ABS: 0 10*3/uL (ref 0.0–0.1)
BASOS PCT: 0 %
Eosinophils Absolute: 0 10*3/uL (ref 0.0–0.7)
Eosinophils Relative: 0 %
HEMATOCRIT: 44.4 % (ref 36.0–46.0)
HEMOGLOBIN: 14.2 g/dL (ref 12.0–15.0)
LYMPHS PCT: 3 %
Lymphs Abs: 0.3 10*3/uL — ABNORMAL LOW (ref 0.7–4.0)
MCH: 29.8 pg (ref 26.0–34.0)
MCHC: 32 g/dL (ref 30.0–36.0)
MCV: 93.1 fL (ref 78.0–100.0)
MONO ABS: 0.8 10*3/uL (ref 0.1–1.0)
Monocytes Relative: 7 %
NEUTROS ABS: 10.6 10*3/uL — AB (ref 1.7–7.7)
NEUTROS PCT: 90 %
Platelets: 116 10*3/uL — ABNORMAL LOW (ref 150–400)
RBC: 4.77 MIL/uL (ref 3.87–5.11)
RDW: 13.7 % (ref 11.5–15.5)
WBC: 11.8 10*3/uL — AB (ref 4.0–10.5)

## 2016-06-07 LAB — BASIC METABOLIC PANEL
ANION GAP: 10 (ref 5–15)
BUN: 32 mg/dL — ABNORMAL HIGH (ref 6–20)
CALCIUM: 9.4 mg/dL (ref 8.9–10.3)
CO2: 32 mmol/L (ref 22–32)
Chloride: 97 mmol/L — ABNORMAL LOW (ref 101–111)
Creatinine, Ser: 1.22 mg/dL — ABNORMAL HIGH (ref 0.44–1.00)
GFR, EST AFRICAN AMERICAN: 44 mL/min — AB (ref >60)
GFR, EST NON AFRICAN AMERICAN: 38 mL/min — AB (ref >60)
Glucose, Bld: 147 mg/dL — ABNORMAL HIGH (ref 65–99)
POTASSIUM: 4.3 mmol/L (ref 3.5–5.1)
SODIUM: 139 mmol/L (ref 135–145)

## 2016-06-07 LAB — URINALYSIS, ROUTINE W REFLEX MICROSCOPIC
Glucose, UA: NEGATIVE mg/dL
Hgb urine dipstick: NEGATIVE
Ketones, ur: NEGATIVE mg/dL
NITRITE: NEGATIVE
PROTEIN: NEGATIVE mg/dL
SPECIFIC GRAVITY, URINE: 1.016 (ref 1.005–1.030)
pH: 5.5 (ref 5.0–8.0)

## 2016-06-07 LAB — D-DIMER, QUANTITATIVE: D-Dimer, Quant: 2.02 ug/mL-FEU — ABNORMAL HIGH (ref 0.00–0.50)

## 2016-06-07 MED ORDER — SODIUM CHLORIDE 0.9 % IV SOLN: INTRAVENOUS | Status: DC

## 2016-06-07 MED ORDER — DIPHENHYDRAMINE HCL 50 MG/ML IJ SOLN
25.0000 mg | Freq: Once | INTRAMUSCULAR | Status: AC
  Administered 2016-06-07: 25 mg via INTRAVENOUS
  Filled 2016-06-07: qty 1

## 2016-06-07 MED ORDER — SODIUM CHLORIDE 0.9 % IV BOLUS (SEPSIS)
250.0000 mL | Freq: Once | INTRAVENOUS | Status: AC
  Administered 2016-06-07: 250 mL via INTRAVENOUS

## 2016-06-07 MED ORDER — METHYLPREDNISOLONE SODIUM SUCC 125 MG IJ SOLR
60.0000 mg | Freq: Once | INTRAMUSCULAR | Status: AC
  Administered 2016-06-07: 60 mg via INTRAVENOUS
  Filled 2016-06-07: qty 2

## 2016-06-07 MED ORDER — IOPAMIDOL (ISOVUE-370) INJECTION 76%
100.0000 mL | Freq: Once | INTRAVENOUS | Status: AC | PRN
  Administered 2016-06-07: 80 mL via INTRAVENOUS

## 2016-06-07 MED ORDER — HYDROCORTISONE NA SUCCINATE PF 100 MG IJ SOLR
200.0000 mg | Freq: Once | INTRAMUSCULAR | Status: DC
  Filled 2016-06-07: qty 4

## 2016-06-07 MED ORDER — DEXTROSE 5 % IV SOLN
1.0000 g | Freq: Once | INTRAVENOUS | Status: AC
  Administered 2016-06-07: 1 g via INTRAVENOUS
  Filled 2016-06-07: qty 10

## 2016-06-07 MED ORDER — FOSFOMYCIN TROMETHAMINE 3 G PO PACK
3.0000 g | PACK | Freq: Once | ORAL | Status: AC
  Administered 2016-06-07: 3 g via ORAL
  Filled 2016-06-07: qty 3

## 2016-06-07 NOTE — ED Notes (Addendum)
Patient in CT

## 2016-06-07 NOTE — ED Notes (Signed)
Bed: WA01 Expected date:  Expected time:  Means of arrival:  Comments: Recheck for UTI

## 2016-06-07 NOTE — ED Provider Notes (Signed)
CSN: [id number]     Arrival date & time 06/07/16  1034 History   First MD Initiated Contact with Patient 06/07/16 1042     Chief Complaint  Patient presents with  . Urinary Tract Infection  . Shortness of Breath     (Consider location/radiation/quality/duration/timing/severity/associated sxs/prior Treatment) HPI   [name] is a 82 y.o. female who presents for evaluation of abdominal pain and shortness of breath. She is a vague historian. She presents by EMS.  Level V caveat  Past Medical History  Diagnosis Date  . Permanent atrial fibrillation ([place])     a. Chronic coumadin.  . Tachy-brady syndrome ([place])     a. Severe in the setting of permanent atrial fibrillation with tachybrady syndrome;  b. 08/2007 s/p MDT [place] ADSR01 DC PPM.  . S/P placement of cardiac pacemaker     a.  08/2007 s/p MDT [place] PPM.  . Brain aneurysm     Hx of  . Hypertension   . Thoracic aneurysm     a. 10/2011 CTA: 5.0 x 3.8 cm.  [name] AAA (abdominal aortic aneurysm) ([place])     a. 12/2011 U/S: 3.3 cm prox AAA.  . S/P aortic valve replacement with bioprosthetic valve     a. [facility].  [name] Aortic insufficiency     a. S/P bioprosthetic AVR;  b. 02/2013 Echo: EF 50-55%, mild to mod AI (mean grad 11mHg).  . Chronic diastolic CHF (congestive heart failure) ([place]     a. 02/2013 Echo: EF 50-55%.  . Diverticulosis   . Hemorrhoids   . Gastritis   . Hepatitis B 1980's  . Skin cancer     face, ear  and left hand.  . Seizures ([place]   . Moderate mitral regurgitation     a. 02/2013 Echo: Mod MR.  . Moderate tricuspid regurgitation     a. 02/2013 Echo: Mod TR.  .Marland KitchenPulmonary hypertension ([place]     a. 02/2013 Echo: PASP 567mg.  . Chest pain     a. H/o nonobs cath;  b. 07/2013 Lexiscan MV: EF 66%, no ischemia.  . Shingles    Past Surgical History  Procedure Laterality Date  . Cerebral aneurysm repair    . Abdominal aortic aneurysm repair      Resection  . Aortic valve replacement      With  bioprosthetic valve  . Pacemaker placement  08/2007    [employer]; AF permanent; s/p pacemaker for bradycardia with now well controlled ventricular response; Digoxin therapy, question level; Lower extremity venous issues  . Breast biopsy    . Cholecystectomy    . Vesicovaginal fistula closure w/ tah    . Appendectomy    . Lung biopsy  2005  . Skin cancer removal      ear, face and hand   Family History  Problem Relation Age of Onset  . Cancer Mother   . Heart disease Mother   . Hypertension Mother   . Varicose Veins Mother   . Heart disease Father   . Hypertension Father   . Heart disease Sister   . Hypertension Sister   . Varicose Veins Sister   . AAA (abdominal aortic aneurysm) Sister   . Heart disease Brother   . Hypertension Brother    Social History  Substance Use Topics  . Smoking status: Never Smoker   . Smokeless tobacco: None  . Alcohol Use: No   OB History    Gravida Para Term Preterm AB TAB  SAB Ectopic Multiple Living   0 0 0 0 0 0 0 0 0 0      Review of Systems  Unable to perform ROS: Mental status change      Allergies  Fluticasone-salmeterol; Furosemide; Iodine; Iohexol; Sulfonamide derivatives; Verapamil; Latex; and Penicillins  Home Medications   Prior to Admission medications   Medication Sig Start Date End Date Taking? Authorizing Provider  albuterol (PROVENTIL HFA;VENTOLIN HFA) 108 (90 Base) MCG/ACT inhaler Inhale 1 puff into the lungs every 6 (six) hours as needed for wheezing or shortness of breath.   Yes Historical Provider, MD  atenolol (TENORMIN) 100 MG tablet Take 0.5 tablets (50 mg total) by mouth at bedtime. 05/14/16  Yes Barton Dubois, MD  busPIRone (BUSPAR) 5 MG tablet Take 0.5 tablets (2.5 mg total) by mouth 2 (two) times daily. 05/14/16  Yes Barton Dubois, MD  diclofenac sodium (VOLTAREN) 1 % GEL Apply 4 g topically 2 (two) times daily.   Yes Historical Provider, MD  HYDROcodone-acetaminophen (NORCO/VICODIN) 5-325 MG tablet Take 1  tablet by mouth every 6 (six) hours as needed for moderate pain.   Yes Historical Provider, MD  latanoprost (XALATAN) 0.005 % ophthalmic solution Place 1 drop into both eyes at bedtime.   Yes Historical Provider, MD  LORazepam (ATIVAN) 0.5 MG tablet Take 1 tablet (0.5 mg total) by mouth 2 (two) times daily. Patient taking differently: Take 0.5 mg by mouth 3 (three) times daily.  05/14/16  Yes Barton Dubois, MD  meclizine (ANTIVERT) 25 MG tablet Take 12.5 mg by mouth 3 (three) times daily as needed for dizziness.   Yes Historical Provider, MD  nitrofurantoin, macrocrystal-monohydrate, (MACROBID) 100 MG capsule Take 100 mg by mouth 2 (two) times daily.   Yes Historical Provider, MD  ondansetron (ZOFRAN-ODT) 4 MG disintegrating tablet Take 4 mg by mouth every 8 (eight) hours as needed for nausea or vomiting.   Yes Historical Provider, MD  pantoprazole (PROTONIX) 40 MG tablet Take 40 mg by mouth daily.   Yes Historical Provider, MD  potassium chloride SA (K-DUR,KLOR-CON) 20 MEQ tablet Take 1 tablet (20 mEq total) by mouth every evening. 12/07/15  Yes Minna Merritts, MD  predniSONE (DELTASONE) 5 MG tablet Take 7.5 mg by mouth every other day. Polymyalgia rheumatica.   Yes Historical Provider, MD  torsemide (DEMADEX) 20 MG tablet Take 1.5 tablets (30 mg total) by mouth daily. 05/14/16  Yes Barton Dubois, MD  traZODone (DESYREL) 50 MG tablet Take 50-100 mg by mouth at bedtime as needed for sleep.    Yes Historical Provider, MD  warfarin (COUMADIN) 5 MG tablet Take 2.5-5 mg by mouth every evening. Take 5 mg daily except tues, thurs, & Saturday take 2.5 mg   Yes Historical Provider, MD  Maltodextrin-Xanthan Gum ([place]) POWD Use as needed to achieve nectar thick consistency in liquids Patient not taking: Reported on 06/07/2016 05/14/16   Barton Dubois, MD   BP 135/67 mmHg  Pulse 60  Temp(Src) 97.9 F (36.6 C) (Oral)  Resp 16  SpO2 96% Physical Exam  Constitutional: She appears  well-developed. No distress.  Elderly, frail  HENT:  Head: Normocephalic and atraumatic.  Right Ear: External ear normal.  Left Ear: External ear normal.  Eyes: Conjunctivae and EOM are normal. Pupils are equal, round, and reactive to light.  Neck: Normal range of motion and phonation normal. Neck supple.  Cardiovascular: Normal rate, regular rhythm and normal heart sounds.   Pulmonary/Chest: Effort normal and breath sounds normal. No respiratory distress.  She exhibits no bony tenderness.  Abdominal: Soft. There is tenderness (Right upper quadrant, mild).  Musculoskeletal: Normal range of motion.  Neurological: She is alert. No cranial nerve deficit or sensory deficit. She exhibits normal muscle tone. Coordination normal.  Skin: Skin is warm, dry and intact.  Psychiatric: She has a normal mood and affect. Her behavior is normal.  Nursing note and vitals reviewed.   ED Course  Procedures (including critical care time)  Medications  0.9 %  sodium chloride infusion ( Intravenous Rate/Dose Change 06/07/16 1551)  cefTRIAXone (ROCEPHIN) 1 g in dextrose 5 % 50 mL IVPB (1 g Intravenous New Bag/Given 06/07/16 1446)  methylPREDNISolone sodium succinate (SOLU-MEDROL) 125 mg/2 mL injection 60 mg (60 mg Intravenous Given 06/07/16 1442)  diphenhydrAMINE (BENADRYL) injection 25 mg (25 mg Intravenous Given 06/07/16 1549)  sodium chloride 0.9 % bolus 250 mL (0 mLs Intravenous Stopped 06/07/16 1550)    Patient Vitals for the past 24 hrs:  BP Temp Temp src Pulse Resp SpO2  06/07/16 1447 135/67 mmHg - - 60 16 96 %  06/07/16 1059 - - - 61 25 93 %  06/07/16 1058 (!) 129/53 mmHg 97.9 F (36.6 C) Oral (!) 55 (!) 29 (!) 88 %    12:43 PM Reevaluation with update and discussion. After initial assessment and treatment, an updated evaluation reveals No change in clinical status. Patient's daughter is now here and states that she is being treated for UTI with Macrodantin, which was started yesterday. Her PCP  treated her based on a urinalysis that was done from the home. The patient had been having dysuria for several days, so a urinalysis was checked which was stated to be "cloudy". Patient's daughter states that she has never had a blood clot. She also states that the patient is not allergic to fluticasone, which the patient is currently taking. Her allergy list states that she has anaphylaxis to fluticasone combined with salmeterol. Patient requires imaging for possible PE, and she has no current evidence source for shortness of breath with hypoxia. Will attempt to evaluate her true allergy status, prior to proceeding with pretreatment, which will be required for use of IV contrast. She has previously had a hypersensitivity response to IV contrast, with resultant fever and chills. Lourine Alberico L   14:25- . As best as can be determined, the patient has tolerated Flonase in the past, and was able to use a pretreatment of Solu-Medrol and Benadryl prior to IV contrast successfully. The patient's pharmacy is closed today. Nursing discussed the situation with on-call pharmacy, to arrive at an appropriate treatment for this patient prior to IV contrast administration for CT imaging, so that she can be evaluated for a PE.  Labs Review Labs Reviewed  BASIC METABOLIC PANEL - Abnormal; Notable for the following:    Chloride 97 (*)    Glucose, Bld 147 (*)    BUN 32 (*)    Creatinine, Ser 1.22 (*)    GFR calc non Af Amer 38 (*)    GFR calc Af Amer 44 (*)    All other components within normal limits  CBC WITH DIFFERENTIAL/PLATELET - Abnormal; Notable for the following:    WBC 11.8 (*)    Platelets 116 (*)    Neutro Abs 10.6 (*)    Lymphs Abs 0.3 (*)    All other components within normal limits  D-DIMER, QUANTITATIVE (NOT AT [facility]) - Abnormal; Notable for the following:    D-Dimer, Quant 2.02 (*)    All other  components within normal limits  URINALYSIS, ROUTINE W REFLEX MICROSCOPIC (NOT AT [facility]) - Abnormal;  Notable for the following:    Color, Urine AMBER (*)    Bilirubin Urine SMALL (*)    Leukocytes, UA SMALL (*)    All other components within normal limits  URINE MICROSCOPIC-ADD ON - Abnormal; Notable for the following:    Squamous Epithelial / LPF 0-5 (*)    Bacteria, UA RARE (*)    All other components within normal limits  URINE CULTURE    Component     Latest Ref Rng 05/13/2016 06/07/2016  Glucose     65 - 99 mg/dL 102 (H) 147 (H)  BUN     6 - 20 mg/dL 34 (H) 32 (H)  Creatinine     0.44 - 1.00 mg/dL 1.07 (H) 1.22 (H)  Sodium     135 - 145 mmol/L 143 139  Potassium     3.5 - 5.1 mmol/L 4.1 4.3  Chloride     101 - 111 mmol/L 101 97 (L)  CO2     22 - 32 mmol/L 34 (H) 32  Calcium     8.9 - 10.3 mg/dL 8.7 (L) 9.4  Anion gap     5 - 15 8 10   EGFR (African American)     >60 mL/min 51 (L) 44 (L)  EGFR (Non-African Amer.)     >60 mL/min 44 (L) 38 (L)     Imaging Review Dg Chest 2 View  06/07/2016  CLINICAL DATA:  Shortness of breath and nausea. EXAM: CHEST  2 VIEW COMPARISON:  05/10/2016 FINDINGS: Stable cardiac enlargement appearance of a single chamber pacemaker and prosthetic aortic valve. Stable aneurysmal dilatation of the thoracic aorta at the level of the distal arch which has been seen by prior CT. Stable pleural thickening versus chronic small right pleural effusion. There is no evidence of pulmonary edema, consolidation, pneumothorax, nodule or pleural fluid. Bony structures show stable osteopenia. IMPRESSION: No active cardiopulmonary disease. Stable cardiac enlargement and aneurysmal dilatation of the thoracic aorta. Electronically Signed   By: Aletta Edouard M.D.   On: 06/07/2016 12:09   I have personally reviewed and evaluated these images and lab results as part of my medical decision-making.   EKG Interpretation   Date/Time:  Saturday June 07 2016 11:06:25 EDT Ventricular Rate:  71 PR Interval:    QRS Duration: 131 QT Interval:  494 QTC Calculation:  684 R Axis:   -73 Text Interpretation:  Ventricular-paced complexes No further rhythm  analysis attempted due to paced rhythm Right atrial enlargement Prolonged  QT interval longer than prior Confirmed by Mallisa Alameda  MD, Prestin Munch ([id number]) on  06/07/2016 11:29:54 AM      MDM   Final diagnoses:  Right lower quadrant abdominal pain  Dysuria  Hypoxia    Nonspecific abdominal pain, with dysuria. Patient with very mild right upper quadrant abdominal discomfort, and evaluation included d-dimer, to evaluate for possibility of PE. Note that the patient was mildly hypoxic, with tachypnea, on arrival. No pneumonia, on plain images. Urinalysis is normal. Rocephin ordered for possible urinary tract infection Urine culture is pending.   Nursing Notes Reviewed/ Care Coordinated, and agree without changes. Applicable Imaging Reviewed.  Interpretation of Laboratory Data incorporated into ED treatment  Plan:- Care to Dr. Regenia Skeeter to evaluate after CT imaging to rule out PE.  Daleen Bo, MD 06/10/16 1014

## 2016-06-07 NOTE — ED Notes (Addendum)
Per EMS, patient is from home.  Patient is complaining of upper abdominal pain and SOB.  She has some nausea but no vomiting.  Patient was diagnosed with UTI 06-06-16 and started on antibiotic.    BP:152/78 HR:95 T:98.3 88% on room air; initially.  98% on 2 L R:18  CBG:141

## 2016-06-07 NOTE — ED Notes (Signed)
Pt given discharge instructions, verbalized understanding of need to follow up, reasons to return to the ED and medications to take at home. VSS. Pt denies pain. IV removed intact, site clean and dry. Pt denied further questions or concerns. Pt transferred to wheelchair and was escorted to exit without difficulty.

## 2016-06-07 NOTE — ED Provider Notes (Signed)
Patient's CT scan shows no PE but does show an enlarged aortic aneurysm at 5.3 cm. 6 years ago it was 4.5 cm. Had a discussion with patient and family and she would not 1 any type of intervention or surgery at this moment. She had discussed with CT surgery 6 years ago with the same effect. She is aware of these findings and does want to continue to follow up with CT surgery about her options. She does not currently have chest pain, back pain, or shortness of breath. Her hypoxia was of unclear etiology, currently off oxygen she is satting well in the 90s. She does state that she is currently on Macrobid which she thinks caused her nausea and abdominal pain earlier this morning. We'll stop this and give fosfomycin which she has had good relief in the past. Follow-up with PCP and CT surgery. Discussed strict return precautions.   Results for orders placed or performed during the hospital encounter of [id number]  Basic metabolic panel  Result Value Ref Range   Sodium 139 135 - 145 mmol/L   Potassium 4.3 3.5 - 5.1 mmol/L   Chloride 97 (L) 101 - 111 mmol/L   CO2 32 22 - 32 mmol/L   Glucose, Bld 147 (H) 65 - 99 mg/dL   BUN 32 (H) 6 - 20 mg/dL   Creatinine, Ser 1.22 (H) 0.44 - 1.00 mg/dL   Calcium 9.4 8.9 - 10.3 mg/dL   GFR calc non Af Amer 38 (L) >60 mL/min   GFR calc Af Amer 44 (L) >60 mL/min   Anion gap 10 5 - 15  CBC with Differential  Result Value Ref Range   WBC 11.8 (H) 4.0 - 10.5 K/uL   RBC 4.77 3.87 - 5.11 MIL/uL   Hemoglobin 14.2 12.0 - 15.0 g/dL   HCT 44.4 36.0 - 46.0 %   MCV 93.1 78.0 - 100.0 fL   MCH 29.8 26.0 - 34.0 pg   MCHC 32.0 30.0 - 36.0 g/dL   RDW 13.7 11.5 - 15.5 %   Platelets 116 (L) 150 - 400 K/uL   Neutrophils Relative % 90 %   Neutro Abs 10.6 (H) 1.7 - 7.7 K/uL   Lymphocytes Relative 3 %   Lymphs Abs 0.3 (L) 0.7 - 4.0 K/uL   Monocytes Relative 7 %   Monocytes Absolute 0.8 0.1 - 1.0 K/uL   Eosinophils Relative 0 %   Eosinophils Absolute 0.0 0.0 - 0.7 K/uL   Basophils  Relative 0 %   Basophils Absolute 0.0 0.0 - 0.1 K/uL  D-dimer, quantitative  Result Value Ref Range   D-Dimer, Quant 2.02 (H) 0.00 - 0.50 ug/mL-FEU  Urinalysis, Routine w reflex microscopic  Result Value Ref Range   Color, Urine AMBER (A) YELLOW   APPearance CLEAR CLEAR   Specific Gravity, Urine 1.016 1.005 - 1.030   pH 5.5 5.0 - 8.0   Glucose, UA NEGATIVE NEGATIVE mg/dL   Hgb urine dipstick NEGATIVE NEGATIVE   Bilirubin Urine SMALL (A) NEGATIVE   Ketones, ur NEGATIVE NEGATIVE mg/dL   Protein, ur NEGATIVE NEGATIVE mg/dL   Nitrite NEGATIVE NEGATIVE   Leukocytes, UA SMALL (A) NEGATIVE  Urine microscopic-add on  Result Value Ref Range   Squamous Epithelial / LPF 0-5 (A) NONE SEEN   WBC, UA 0-5 0 - 5 WBC/hpf   RBC / HPF 0-5 0 - 5 RBC/hpf   Bacteria, UA RARE (A) NONE SEEN   Dg Chest 2 View  06/07/2016  CLINICAL DATA:  Shortness of  breath and nausea. EXAM: CHEST  2 VIEW COMPARISON:  05/10/2016 FINDINGS: Stable cardiac enlargement appearance of a single chamber pacemaker and prosthetic aortic valve. Stable aneurysmal dilatation of the thoracic aorta at the level of the distal arch which has been seen by prior CT. Stable pleural thickening versus chronic small right pleural effusion. There is no evidence of pulmonary edema, consolidation, pneumothorax, nodule or pleural fluid. Bony structures show stable osteopenia. IMPRESSION: No active cardiopulmonary disease. Stable cardiac enlargement and aneurysmal dilatation of the thoracic aorta. Electronically Signed   By: Aletta Edouard M.D.   On: 06/07/2016 12:09   Dg Chest 2 View  05/10/2016  CLINICAL DATA:  Shortness of breath and recent pneumonia. EXAM: CHEST  2 VIEW COMPARISON:  Seven/under size and 1,016 FINDINGS: Stable cardiac enlargement and appearance of single chamber pacemaker. Stable aortic atherosclerosis with aneurysmal disease of the descending thoracic aorta. There are small bilateral pleural effusions, right greater than left. No  overt edema or infiltrate. IMPRESSION: Small bilateral pleural effusions without overt edema or infiltrate. Stable aortic atherosclerosis and aneurysmal disease of the descending thoracic aorta. Electronically Signed   By: Aletta Edouard M.D.   On: 05/10/2016 14:43   Ct Angio Chest Pe W/cm &/or Wo Cm  06/07/2016  CLINICAL DATA:  Upper abdominal pain, shortness of breath, evaluate for PE EXAM: CT ANGIOGRAPHY CHEST WITH CONTRAST TECHNIQUE: Multidetector CT imaging of the chest was performed using the standard protocol during bolus administration of intravenous contrast. Multiplanar CT image reconstructions and MIPs were obtained to evaluate the vascular anatomy. CONTRAST:  80 mL Isovue 370 IV COMPARISON:  Chest radiographs dated 06/07/2016. CT chest dated 06/04/2010. FINDINGS: Cardiovascular: Satisfactory evaluation of the pulmonary arteries to the subsegmental level. No evidence of pulmonary embolism. Prosthetic aortic valve. Atherosclerotic calcifications of the aortic arch. 5.3 cm aneurysm at the level of the proximal descending thoracic aorta (series 4/ image 21), previously 4.5 cm. Aorta measures 4.9 cm the level of the mid descending thoracic aorta (series 4/ image 44), previously 4.3 cm. Insufficient opacification of the aorta at the time of imaging to assess for aortic dissection. Cardiomegaly.  No pericardial effusion. Mediastinum/Nodes: 13 mm short axis subcarinal node (series 4/ image 31). No suspicious hilar or axillary lymphadenopathy. Visualized thyroid is unremarkable. Lungs/Pleura: Mild subpleural scarring in the lateral right middle lobe. Additional scarring in the medial left lower lobe adjacent to the aorta. These have progressed from 2011 but are not considered suspicious. No suspicious pulmonary nodules. No focal consolidation. Trace bilateral pleural effusions. Mild emphysematous changes. No pneumothorax. Upper Abdomen: Visualized upper abdomen is notable for scattered hepatic cysts measuring  up to 3.8 cm in the posterior right hepatic lobe, previously measuring up to 3.6 cm, benign. Mild thickening of the left adrenal gland (series 4/ image 91), without discrete mass. Musculoskeletal: Degenerative changes of the visualized thoracolumbar spine. Median sternotomy. Review of the MIP images confirms the above findings. IMPRESSION: No evidence of pulmonary embolism. Thoracic aortic aneurysm, measuring 5.3 cm at the level of the proximal descending thoracic aorta, previously 4.5 cm. Recommend semi-annual imaging followup by CTA or MRA and referral to cardiothoracic surgery if not already obtained. This recommendation follows 2010 ACCF/AHA/AATS/ACR/ASA/SCA/SCAI/SIR/STS/SVM Guidelines for the Diagnosis and Management of Patients With Thoracic Aortic Disease. Circulation. 2010; 121: [id number] Trace bilateral pleural effusions. Additional ancillary findings as above. Electronically Signed   By: [name] M.D.   On: 06/07/2016 17:54      Sherwood Gambler, MD 06/07/16 [phone]

## 2016-06-08 LAB — URINE CULTURE: Special Requests: NORMAL

## 2016-06-13 ENCOUNTER — Ambulatory Visit (INDEPENDENT_AMBULATORY_CARE_PROVIDER_SITE_OTHER): Payer: Medicare Other | Admitting: Internal Medicine

## 2016-06-13 DIAGNOSIS — Z954 Presence of other heart-valve replacement: Secondary | ICD-10-CM

## 2016-06-13 DIAGNOSIS — Z953 Presence of xenogenic heart valve: Secondary | ICD-10-CM

## 2016-06-13 DIAGNOSIS — I4819 Other persistent atrial fibrillation: Secondary | ICD-10-CM

## 2016-06-13 DIAGNOSIS — I481 Persistent atrial fibrillation: Secondary | ICD-10-CM

## 2016-06-13 DIAGNOSIS — Z5181 Encounter for therapeutic drug level monitoring: Secondary | ICD-10-CM

## 2016-06-13 LAB — POCT INR: INR: 3.1

## 2016-06-20 LAB — LAB REPORT - SCANNED: INR: 1.5

## 2016-06-20 NOTE — Telephone Encounter (Signed)
This encounter was created in error - please disregard.

## 2016-06-23 ENCOUNTER — Encounter: Payer: Self-pay | Admitting: Cardiovascular Disease

## 2016-06-23 ENCOUNTER — Ambulatory Visit (INDEPENDENT_AMBULATORY_CARE_PROVIDER_SITE_OTHER): Payer: Medicare Other | Admitting: Cardiology

## 2016-06-23 DIAGNOSIS — Z954 Presence of other heart-valve replacement: Secondary | ICD-10-CM

## 2016-06-23 DIAGNOSIS — Z5181 Encounter for therapeutic drug level monitoring: Secondary | ICD-10-CM

## 2016-06-23 DIAGNOSIS — Z953 Presence of xenogenic heart valve: Secondary | ICD-10-CM

## 2016-06-23 DIAGNOSIS — I481 Persistent atrial fibrillation: Secondary | ICD-10-CM

## 2016-06-23 DIAGNOSIS — I4819 Other persistent atrial fibrillation: Secondary | ICD-10-CM

## 2016-06-23 LAB — POCT INR: INR: 2

## 2016-06-27 ENCOUNTER — Ambulatory Visit (INDEPENDENT_AMBULATORY_CARE_PROVIDER_SITE_OTHER): Payer: Medicare Other | Admitting: Cardiology

## 2016-06-27 ENCOUNTER — Telehealth: Payer: Self-pay | Admitting: Cardiovascular Disease

## 2016-06-27 ENCOUNTER — Encounter: Payer: Self-pay | Admitting: Cardiovascular Disease

## 2016-06-27 DIAGNOSIS — Z953 Presence of xenogenic heart valve: Secondary | ICD-10-CM

## 2016-06-27 DIAGNOSIS — Z954 Presence of other heart-valve replacement: Secondary | ICD-10-CM

## 2016-06-27 DIAGNOSIS — Z5181 Encounter for therapeutic drug level monitoring: Secondary | ICD-10-CM

## 2016-06-27 LAB — POCT INR: INR: 3

## 2016-06-27 NOTE — Telephone Encounter (Signed)
Orders received from [facility] for coumadin changes dated 06/20/16 and 06/23/16. Dr. Wende Bushy signed these orders since Dr. Rockey Situ is out this week and then faxed back to [facility]. Originals placed in "Faxed" bin on [employer].

## 2016-07-04 ENCOUNTER — Ambulatory Visit (INDEPENDENT_AMBULATORY_CARE_PROVIDER_SITE_OTHER): Payer: Medicare Other | Admitting: Internal Medicine

## 2016-07-04 DIAGNOSIS — Z5181 Encounter for therapeutic drug level monitoring: Secondary | ICD-10-CM

## 2016-07-04 DIAGNOSIS — Z953 Presence of xenogenic heart valve: Secondary | ICD-10-CM

## 2016-07-04 DIAGNOSIS — Z954 Presence of other heart-valve replacement: Secondary | ICD-10-CM

## 2016-07-04 LAB — POCT INR: INR: 2.3

## 2016-07-16 ENCOUNTER — Telehealth: Payer: Self-pay | Admitting: Cardiology

## 2016-07-16 ENCOUNTER — Ambulatory Visit (INDEPENDENT_AMBULATORY_CARE_PROVIDER_SITE_OTHER): Payer: Medicare Other | Admitting: *Deleted

## 2016-07-16 ENCOUNTER — Encounter: Payer: Medicare Other | Admitting: *Deleted

## 2016-07-16 DIAGNOSIS — G459 Transient cerebral ischemic attack, unspecified: Secondary | ICD-10-CM

## 2016-07-16 DIAGNOSIS — I4891 Unspecified atrial fibrillation: Secondary | ICD-10-CM | POA: Diagnosis not present

## 2016-07-16 DIAGNOSIS — I359 Nonrheumatic aortic valve disorder, unspecified: Secondary | ICD-10-CM | POA: Diagnosis not present

## 2016-07-16 DIAGNOSIS — Z953 Presence of xenogenic heart valve: Secondary | ICD-10-CM

## 2016-07-16 DIAGNOSIS — Z954 Presence of other heart-valve replacement: Secondary | ICD-10-CM

## 2016-07-16 DIAGNOSIS — Z5181 Encounter for therapeutic drug level monitoring: Secondary | ICD-10-CM

## 2016-07-16 LAB — POCT INR: INR: 2.6

## 2016-07-16 NOTE — Telephone Encounter (Signed)
Confirmed remote transmission w/ pt niece.   

## 2016-07-18 ENCOUNTER — Encounter: Payer: Self-pay | Admitting: Cardiology

## 2016-07-20 ENCOUNTER — Other Ambulatory Visit: Payer: Self-pay | Admitting: Internal Medicine

## 2016-07-28 ENCOUNTER — Telehealth: Payer: Self-pay | Admitting: Cardiology

## 2016-07-28 ENCOUNTER — Ambulatory Visit (INDEPENDENT_AMBULATORY_CARE_PROVIDER_SITE_OTHER): Payer: Medicare Other | Admitting: *Deleted

## 2016-07-28 DIAGNOSIS — Z950 Presence of cardiac pacemaker: Secondary | ICD-10-CM

## 2016-07-28 NOTE — Telephone Encounter (Signed)
Spoke w/ pt niece and informed her that pt has reached ERI. Informed her that this means pt has about 3 months left on the battery life. Informed her that I would send a message to scheduler to get patient seen sooner by MD than the November 2017 appt. Pt niece verbalized understanding.

## 2016-07-30 LAB — CUP PACEART REMOTE DEVICE CHECK
Battery Impedance: 8309 Ohm
Battery Voltage: 2.58 V
Brady Statistic RV Percent Paced: 99 %
Date Time Interrogation Session: 20170911000843
Implantable Lead Implant Date: 20081003
Implantable Lead Model: 5076
MDC IDC LEAD LOCATION: 753860
MDC IDC MSMT LEADCHNL RA IMPEDANCE VALUE: 0 Ohm
MDC IDC MSMT LEADCHNL RV IMPEDANCE VALUE: 598 Ohm
MDC IDC SET LEADCHNL RV PACING AMPLITUDE: 2 V
MDC IDC SET LEADCHNL RV PACING PULSEWIDTH: 0.4 ms
MDC IDC SET LEADCHNL RV SENSING SENSITIVITY: 5.6 mV

## 2016-07-31 ENCOUNTER — Encounter: Payer: Self-pay | Admitting: Cardiology

## 2016-07-31 NOTE — Progress Notes (Signed)
Remote pacemaker transmission.   

## 2016-08-06 ENCOUNTER — Ambulatory Visit (INDEPENDENT_AMBULATORY_CARE_PROVIDER_SITE_OTHER): Payer: Medicare Other | Admitting: *Deleted

## 2016-08-06 DIAGNOSIS — Z954 Presence of other heart-valve replacement: Secondary | ICD-10-CM

## 2016-08-06 DIAGNOSIS — I359 Nonrheumatic aortic valve disorder, unspecified: Secondary | ICD-10-CM

## 2016-08-06 DIAGNOSIS — G459 Transient cerebral ischemic attack, unspecified: Secondary | ICD-10-CM | POA: Diagnosis not present

## 2016-08-06 DIAGNOSIS — I4891 Unspecified atrial fibrillation: Secondary | ICD-10-CM

## 2016-08-06 DIAGNOSIS — Z953 Presence of xenogenic heart valve: Secondary | ICD-10-CM

## 2016-08-06 DIAGNOSIS — Z5181 Encounter for therapeutic drug level monitoring: Secondary | ICD-10-CM

## 2016-08-06 LAB — POCT INR: INR: 2.9

## 2016-08-12 ENCOUNTER — Encounter: Payer: Self-pay | Admitting: Internal Medicine

## 2016-08-14 ENCOUNTER — Encounter: Payer: Medicare Other | Admitting: Internal Medicine

## 2016-08-14 ENCOUNTER — Ambulatory Visit (INDEPENDENT_AMBULATORY_CARE_PROVIDER_SITE_OTHER): Payer: Medicare Other | Admitting: Internal Medicine

## 2016-08-14 ENCOUNTER — Encounter: Payer: Self-pay | Admitting: Internal Medicine

## 2016-08-14 DIAGNOSIS — I10 Essential (primary) hypertension: Secondary | ICD-10-CM | POA: Diagnosis not present

## 2016-08-14 DIAGNOSIS — I482 Chronic atrial fibrillation: Secondary | ICD-10-CM

## 2016-08-14 DIAGNOSIS — Z950 Presence of cardiac pacemaker: Secondary | ICD-10-CM | POA: Diagnosis not present

## 2016-08-14 DIAGNOSIS — I5030 Unspecified diastolic (congestive) heart failure: Secondary | ICD-10-CM

## 2016-08-14 DIAGNOSIS — I4821 Permanent atrial fibrillation: Secondary | ICD-10-CM

## 2016-08-14 NOTE — Progress Notes (Signed)
Patient Care Team: Jodi Marble, MD as PCP - General (Internal Medicine) Minna Merritts, MD (Cardiology) Franchot Gallo, MD as Attending Physician (Urology) Frederik Pear, MD (Orthopedic Surgery) Deboraha Sprang, MD as Consulting Physician (Cardiology)   HPI  [name] is a 82 y.o. female Seen in followup for permanent pacer implanted in the setting of permanent atrial fibrillation and bradycardia. She is also status post bioprosthetic aortic valve replacement. She has prior TIA with her INR target 2.5-3.0   Over the last few weeks she has had increasing problems with fatigue and exercise tolerance. This correlates with reversion of the device to ERI She's had no lightheadedness or syncope    Echocardiogram in February 2012 showed ejection fraction 50-55%, pulmonary pressures 46 mm of mercury; mild aortic stenosis with intact aortic valve, bioprosthetic valve, otherwise normal study.       Past Medical History:  Diagnosis Date  . AAA (abdominal aortic aneurysm) ([place])    a. 12/2011 U/S: 3.3 cm prox AAA.  [name] Aortic insufficiency    a. S/P bioprosthetic AVR;  b. 02/2013 Echo: EF 50-55%, mild to mod AI (mean grad 46mmHg).  . Brain aneurysm    Hx of  . Chest pain    a. H/o nonobs cath;  b. 07/2013 Lexiscan MV: EF 66%, no ischemia.  . Chronic diastolic CHF (congestive heart failure) ([place])    a. 02/2013 Echo: EF 50-55%.  . Diverticulosis   . Gastritis   . Hemorrhoids   . Hepatitis B 1980's  . Hypertension   . Moderate mitral regurgitation    a. 02/2013 Echo: Mod MR.  . Moderate tricuspid regurgitation    a. 02/2013 Echo: Mod TR.  [name] Permanent atrial fibrillation ([place])    a. Chronic coumadin.  . Pulmonary hypertension ([place])    a. 02/2013 Echo: PASP 58mmHg.  . S/P aortic valve replacement with bioprosthetic valve    a. [facility].  . S/P placement of cardiac pacemaker    a.  08/2007 s/p MDT [place] [facility].  . Seizures ([place])   . Shingles   . Skin cancer     face, ear  and left hand.  . Tachy-brady syndrome ([place])    a. Severe in the setting of permanent atrial fibrillation with tachybrady syndrome;  b. 08/2007 s/p MDT [place] [facility].  . Thoracic aneurysm    a. 10/2011 CTA: 5.0 x 3.8 cm.    Past Surgical History:  Procedure Laterality Date  . ABDOMINAL AORTIC ANEURYSM REPAIR     Resection  . AORTIC VALVE REPLACEMENT     With bioprosthetic valve  . APPENDECTOMY    . BREAST BIOPSY    . CEREBRAL ANEURYSM REPAIR    . CHOLECYSTECTOMY    . LUNG BIOPSY  2005  . PACEMAKER PLACEMENT  08/2007   Medtronic; AF permanent; s/p pacemaker for bradycardia with now well controlled ventricular response; Digoxin therapy, question level; Lower extremity venous issues  . skin cancer removal     ear, face and hand  . VESICOVAGINAL FISTULA CLOSURE W/ TAH      Current Outpatient Prescriptions  Medication Sig Dispense Refill  . albuterol (PROVENTIL HFA;VENTOLIN HFA) 108 (90 Base) MCG/ACT inhaler Inhale 1 puff into the lungs every 6 (six) hours as needed for wheezing or shortness of breath.    [name] atenolol (TENORMIN) 100 MG tablet Take 0.5 tablets (50 mg total) by mouth at bedtime.    . busPIRone (BUSPAR) 5 MG tablet Take 0.5 tablets (  2.5 mg total) by mouth 2 (two) times daily. 30 tablet 0  . diclofenac sodium (VOLTAREN) 1 % GEL Apply 4 g topically 2 (two) times daily.    [name] HYDROcodone-acetaminophen (NORCO/VICODIN) 5-325 MG tablet Take 1 tablet by mouth every 6 (six) hours as needed for moderate pain.    [name] latanoprost (XALATAN) 0.005 % ophthalmic solution Place 1 drop into both eyes at bedtime.    [name] LORazepam (ATIVAN) 0.5 MG tablet Take 1 tablet (0.5 mg total) by mouth 2 (two) times daily. (Patient taking differently: Take 0.5 mg by mouth 3 (three) times daily. )    . meclizine (ANTIVERT) 25 MG tablet Take 12.5 mg by mouth 3 (three) times daily as needed for dizziness.    . ondansetron (ZOFRAN-ODT) 4 MG disintegrating tablet Take 4 mg by mouth every 8 (eight)  hours as needed for nausea or vomiting.    . pantoprazole (PROTONIX) 40 MG tablet Take 40 mg by mouth daily.    . potassium chloride SA (K-DUR,KLOR-CON) 20 MEQ tablet Take 1 tablet (20 mEq total) by mouth every evening. 90 tablet 3  . predniSONE (DELTASONE) 5 MG tablet Take 7.5 mg by mouth every other day. Polymyalgia rheumatica.    [name] torsemide (DEMADEX) 20 MG tablet Take 1.5 tablets (30 mg total) by mouth daily. 45 tablet 1  . traZODone (DESYREL) 50 MG tablet Take 50-100 mg by mouth at bedtime as needed for sleep.     [name] warfarin (COUMADIN) 5 MG tablet TAKE AS DIRECTED BY THE COUMADIN CLINIC 30 tablet 3   No current facility-administered medications for this visit.     Allergies  Allergen Reactions  . Fluticasone-Salmeterol Anaphylaxis  . Furosemide Shortness Of Breath  . Iodine Other (See Comments)    Reaction:  Fever and chills   . Iohexol Other (See Comments)    Desc:  Pt requires a 13 hr prep, when doing exam with contrast....fever, chills, dyspnea (slg/04/27/09--given 2 hour solumedrol/ benadryl prep--no reaction).    . Sulfonamide Derivatives Nausea And Vomiting  . Verapamil Nausea And Vomiting and Other (See Comments)    Reaction:  Loss of appetite   . Macrobid [Nitrofurantoin Monohyd Macro] Nausea Only and Other (See Comments)    Severe stomach pain  . Latex Rash  . Penicillins Rash and Other (See Comments)    Pt unable to answer additional questions about this medication.   Has patient had a PCN reaction causing immediate rash, facial/tongue/throat swelling, SOB or lightheadedness with hypotension: Yes Has patient had a PCN reaction causing severe rash involving mucus membranes or skin necrosis: UnknownHas patient had a PCN reaction that required hospitalization NoHas patient had a PCN reaction occurring within the last 10 years: No If all of the above answers are "NO", then may proceed with Cephalosporin    Review of Systems negative except from HPI and PMH  Physical  Exam BP (!) 146/74   Pulse 65   Ht 5\' 3"  (1.6 m)   Wt 143 lb (64.9 kg)   SpO2 97%   BMI 25.33 kg/m  Well developed and well nourished in no acute distress HENT normal E scleral and icterus clear Neck Supple Clear to ausculation  Regular rate and rhythm, no murmurs gallops or rub Soft with active bowel sounds; mild distention No clubbing cyanosis no Edema Alert and oriented, grossly normal motor and sensory function Skin Warm and Dry there is significant erythema and warmth over her right lateral leg.  ECG demonstrates what I think is sinus  rhythm atabout 20 bpm with ventricular pacing at 65 demonstrating ERI  Assessment and  Plan  HFpEF  Atrial fibrillation-permanent The patient's device was interrogated.  The information was reviewed. No changes were made in the programming.    Pacemaker-Medtronic  Device at [employer]   Hypertension    Her device has reached ERI and she has been quite symptomatic with this.  Euvolemic continue current meds  BP is reasonable   We have reviewed the benefits and risks of generator replacement.  These include but are not limited to lead fracture and infection.  The patient understands, agrees and is willing to proceed.

## 2016-08-14 NOTE — Patient Instructions (Signed)
Medication Instructions: - Your physician recommends that you continue on your current medications as directed. Please refer to the Current Medication list given to you today.  Labwork: - none ordered  Procedures/Testing: - Your physician has recommended that you have a pacemaker generator (battery) change.  Follow-Up: - Your physician recommends that you schedule a follow-up appointment in: 10-14 days (from 08/20/16) for a wound check with the [facility].   Any Additional Special Instructions Will Be Listed Below (If Applicable).     If you need a refill on your cardiac medications before your next appointment, please call your pharmacy.

## 2016-08-15 ENCOUNTER — Encounter: Payer: Self-pay | Admitting: *Deleted

## 2016-08-19 ENCOUNTER — Telehealth: Payer: Self-pay | Admitting: Internal Medicine

## 2016-08-19 NOTE — Telephone Encounter (Signed)
New Message  Pt Niece called requesting to speak with RN about a prescription she states Dr. Caryl Comes was going to send to the pharmacy. Pt niece does not know the name of med, but states the the med prescription was not sent to the pharmacy. Please call back to discuss

## 2016-08-19 NOTE — Telephone Encounter (Signed)
thought she was to get antibiotic before procedure, no notation of this in OV notes, pt aware & verbalized understanding

## 2016-08-20 ENCOUNTER — Ambulatory Visit (HOSPITAL_COMMUNITY)
Admission: RE | Admit: 2016-08-20 | Discharge: 2016-08-20 | Disposition: A | Discharge status: Home / Self Care | Payer: Medicare Other | Source: Ambulatory Visit | Attending: Internal Medicine | Admitting: Internal Medicine

## 2016-08-20 ENCOUNTER — Encounter (HOSPITAL_COMMUNITY)
Admission: RE | Disposition: A | Discharge status: Home / Self Care | Payer: Self-pay | Source: Ambulatory Visit | Attending: Internal Medicine

## 2016-08-20 DIAGNOSIS — I442 Atrioventricular block, complete: Secondary | ICD-10-CM

## 2016-08-20 DIAGNOSIS — Z91040 Latex allergy status: Secondary | ICD-10-CM | POA: Insufficient documentation

## 2016-08-20 DIAGNOSIS — Z883 Allergy status to other anti-infective agents status: Secondary | ICD-10-CM | POA: Insufficient documentation

## 2016-08-20 DIAGNOSIS — Z8673 Personal history of transient ischemic attack (TIA), and cerebral infarction without residual deficits: Secondary | ICD-10-CM | POA: Insufficient documentation

## 2016-08-20 DIAGNOSIS — K5790 Diverticulosis of intestine, part unspecified, without perforation or abscess without bleeding: Secondary | ICD-10-CM | POA: Insufficient documentation

## 2016-08-20 DIAGNOSIS — I4891 Unspecified atrial fibrillation: Secondary | ICD-10-CM | POA: Diagnosis present

## 2016-08-20 DIAGNOSIS — Z950 Presence of cardiac pacemaker: Secondary | ICD-10-CM | POA: Diagnosis present

## 2016-08-20 DIAGNOSIS — Z45010 Encounter for checking and testing of cardiac pacemaker pulse generator [battery]: Secondary | ICD-10-CM | POA: Insufficient documentation

## 2016-08-20 DIAGNOSIS — I110 Hypertensive heart disease with heart failure: Secondary | ICD-10-CM | POA: Insufficient documentation

## 2016-08-20 DIAGNOSIS — I714 Abdominal aortic aneurysm, without rupture: Secondary | ICD-10-CM | POA: Diagnosis not present

## 2016-08-20 DIAGNOSIS — I4819 Other persistent atrial fibrillation: Secondary | ICD-10-CM

## 2016-08-20 DIAGNOSIS — Z882 Allergy status to sulfonamides status: Secondary | ICD-10-CM | POA: Insufficient documentation

## 2016-08-20 DIAGNOSIS — I272 Pulmonary hypertension, unspecified: Secondary | ICD-10-CM | POA: Diagnosis not present

## 2016-08-20 DIAGNOSIS — Z8719 Personal history of other diseases of the digestive system: Secondary | ICD-10-CM | POA: Diagnosis not present

## 2016-08-20 DIAGNOSIS — Z880 Allergy status to penicillin: Secondary | ICD-10-CM | POA: Insufficient documentation

## 2016-08-20 DIAGNOSIS — I482 Chronic atrial fibrillation: Secondary | ICD-10-CM | POA: Diagnosis not present

## 2016-08-20 DIAGNOSIS — Z953 Presence of xenogenic heart valve: Secondary | ICD-10-CM | POA: Diagnosis not present

## 2016-08-20 DIAGNOSIS — I4821 Permanent atrial fibrillation: Secondary | ICD-10-CM

## 2016-08-20 DIAGNOSIS — I5032 Chronic diastolic (congestive) heart failure: Secondary | ICD-10-CM | POA: Diagnosis not present

## 2016-08-20 DIAGNOSIS — Z7901 Long term (current) use of anticoagulants: Secondary | ICD-10-CM | POA: Diagnosis not present

## 2016-08-20 DIAGNOSIS — R569 Unspecified convulsions: Secondary | ICD-10-CM | POA: Diagnosis not present

## 2016-08-20 HISTORY — PX: EP IMPLANTABLE DEVICE: SHX172B

## 2016-08-20 LAB — CBC
HEMATOCRIT: 47 % — AB (ref 36.0–46.0)
Hemoglobin: 15 g/dL (ref 12.0–15.0)
MCH: 29.8 pg (ref 26.0–34.0)
MCHC: 31.9 g/dL (ref 30.0–36.0)
MCV: 93.4 fL (ref 78.0–100.0)
Platelets: 121 10*3/uL — ABNORMAL LOW (ref 150–400)
RBC: 5.03 MIL/uL (ref 3.87–5.11)
RDW: 14.3 % (ref 11.5–15.5)
WBC: 5.9 10*3/uL (ref 4.0–10.5)

## 2016-08-20 LAB — BASIC METABOLIC PANEL
Anion gap: 10 (ref 5–15)
BUN: 23 mg/dL — ABNORMAL HIGH (ref 6–20)
CALCIUM: 9.4 mg/dL (ref 8.9–10.3)
CHLORIDE: 98 mmol/L — AB (ref 101–111)
CO2: 34 mmol/L — AB (ref 22–32)
CREATININE: 1.08 mg/dL — AB (ref 0.44–1.00)
GFR calc Af Amer: 50 mL/min — ABNORMAL LOW (ref >60)
GFR calc non Af Amer: 43 mL/min — ABNORMAL LOW (ref >60)
GLUCOSE: 100 mg/dL — AB (ref 65–99)
Potassium: 3.6 mmol/L (ref 3.5–5.1)
Sodium: 142 mmol/L (ref 135–145)

## 2016-08-20 LAB — PROTIME-INR
INR: 3.16
PROTHROMBIN TIME: 33.2 s — AB (ref 11.4–15.2)

## 2016-08-20 LAB — SURGICAL PCR SCREEN
MRSA, PCR: NEGATIVE
STAPHYLOCOCCUS AUREUS: NEGATIVE

## 2016-08-20 SURGERY — PPM/BIV PPM GENERATOR CHANGEOUT
Anesthesia: LOCAL

## 2016-08-20 MED ORDER — SODIUM CHLORIDE 0.9 % IV SOLN
INTRAVENOUS | Status: DC
  Administered 2016-08-20: 13:00:00 via INTRAVENOUS

## 2016-08-20 MED ORDER — LIDOCAINE HCL (PF) 1 % IJ SOLN
INTRAMUSCULAR | Status: DC | PRN
  Administered 2016-08-20: 56 mL via INTRADERMAL

## 2016-08-20 MED ORDER — FENTANYL CITRATE (PF) 100 MCG/2ML IJ SOLN
INTRAMUSCULAR | Status: AC
Start: 2016-08-20 — End: 2016-08-20
  Filled 2016-08-20: qty 2

## 2016-08-20 MED ORDER — VANCOMYCIN HCL IN DEXTROSE 1-5 GM/200ML-% IV SOLN
1000.0000 mg | INTRAVENOUS | Status: AC
  Administered 2016-08-20: 1000 mg via INTRAVENOUS

## 2016-08-20 MED ORDER — MUPIROCIN 2 % EX OINT
TOPICAL_OINTMENT | CUTANEOUS | Status: AC
  Filled 2016-08-20: qty 22

## 2016-08-20 MED ORDER — SODIUM CHLORIDE 0.9 % IR SOLN
Status: AC
  Filled 2016-08-20: qty 2

## 2016-08-20 MED ORDER — SODIUM CHLORIDE 0.9 % IR SOLN
80.0000 mg | Status: AC
  Administered 2016-08-20: 80 mg

## 2016-08-20 MED ORDER — MUPIROCIN 2 % EX OINT
1.0000 "application " | TOPICAL_OINTMENT | Freq: Once | CUTANEOUS | Status: AC
  Administered 2016-08-20: 1 via TOPICAL
  Filled 2016-08-20: qty 22

## 2016-08-20 MED ORDER — ONDANSETRON HCL 4 MG/2ML IJ SOLN: 4.0000 mg | Freq: Four times a day (QID) | INTRAMUSCULAR | Status: DC | PRN

## 2016-08-20 MED ORDER — LIDOCAINE HCL (PF) 1 % IJ SOLN
INTRAMUSCULAR | Status: AC
  Filled 2016-08-20: qty 60

## 2016-08-20 MED ORDER — ACETAMINOPHEN 325 MG PO TABS
325.0000 mg | ORAL_TABLET | ORAL | Status: DC | PRN
  Filled 2016-08-20: qty 2

## 2016-08-20 MED ORDER — MIDAZOLAM HCL 5 MG/5ML IJ SOLN
INTRAMUSCULAR | Status: AC
  Filled 2016-08-20: qty 5

## 2016-08-20 MED ORDER — VANCOMYCIN HCL IN DEXTROSE 1-5 GM/200ML-% IV SOLN
INTRAVENOUS | Status: AC
  Filled 2016-08-20: qty 200

## 2016-08-20 MED ORDER — BACITRACIN ZINC 500 UNIT/GM EX OINT
TOPICAL_OINTMENT | Freq: Two times a day (BID) | CUTANEOUS | Status: DC
  Filled 2016-08-20: qty 28.35

## 2016-08-20 MED ORDER — SODIUM CHLORIDE 0.9 % IV SOLN: INTRAVENOUS | Status: AC

## 2016-08-20 SURGICAL SUPPLY — 7 items
CABLE SURGICAL S-101-97-12 (CABLE) ×1 IMPLANT
DEVICE DISSECT PLASMABLAD 3.0S (MISCELLANEOUS) IMPLANT
HEMOSTAT SURGICEL 2X4 FIBR (HEMOSTASIS) ×2 IMPLANT
PAD DEFIB LIFELINK (PAD) ×1 IMPLANT
PLASMABLADE 3.0S (MISCELLANEOUS) ×2
PPM SENSIA SR SESR01 (Pacemaker) ×1 IMPLANT
TRAY PACEMAKER INSERTION (PACKS) ×1 IMPLANT

## 2016-08-20 NOTE — Interval H&P Note (Signed)
History and Physical Interval Note:  08/20/2016 1:24 PM  [name]  has presented today for surgery, with the diagnosis of ppm eol  The various methods of treatment have been discussed with the patient and family. After consideration of risks, benefits and other options for treatment, the patient has consented to  Procedure(s): PPM Generator Changeout (N/A) as a surgical intervention .  The patient's history has been reviewed, patient examined, no change in status, stable for surgery.  I have reviewed the patient's chart and labs.  Questions were answered to the patient's satisfaction.     Virl Axe

## 2016-08-20 NOTE — H&P (View-Only) (Signed)
Patient Care Team: Jodi Marble, MD as PCP - General (Internal Medicine) Minna Merritts, MD (Cardiology) Franchot Gallo, MD as Attending Physician (Urology) Frederik Pear, MD (Orthopedic Surgery) Deboraha Sprang, MD as Consulting Physician (Cardiology)   HPI  [name] is a 82 y.o. female Seen in followup for permanent pacer implanted in the setting of permanent atrial fibrillation and bradycardia. She is also status post bioprosthetic aortic valve replacement. She has prior TIA with her INR target 2.5-3.0   Over the last few weeks she has had increasing problems with fatigue and exercise tolerance. This correlates with reversion of the device to ERI She's had no lightheadedness or syncope    Echocardiogram in February 2012 showed ejection fraction 50-55%, pulmonary pressures 46 mm of mercury; mild aortic stenosis with intact aortic valve, bioprosthetic valve, otherwise normal study.       Past Medical History:  Diagnosis Date  . AAA (abdominal aortic aneurysm) ([place])    a. 12/2011 U/S: 3.3 cm prox AAA.  [name] Aortic insufficiency    a. S/P bioprosthetic AVR;  b. 02/2013 Echo: EF 50-55%, mild to mod AI (mean grad 68mmHg).  . Brain aneurysm    Hx of  . Chest pain    a. H/o nonobs cath;  b. 07/2013 Lexiscan MV: EF 66%, no ischemia.  . Chronic diastolic CHF (congestive heart failure) ([place])    a. 02/2013 Echo: EF 50-55%.  . Diverticulosis   . Gastritis   . Hemorrhoids   . Hepatitis B 1980's  . Hypertension   . Moderate mitral regurgitation    a. 02/2013 Echo: Mod MR.  . Moderate tricuspid regurgitation    a. 02/2013 Echo: Mod TR.  [name] Permanent atrial fibrillation ([place])    a. Chronic coumadin.  . Pulmonary hypertension ([place])    a. 02/2013 Echo: PASP 96mmHg.  . S/P aortic valve replacement with bioprosthetic valve    a. [facility].  . S/P placement of cardiac pacemaker    a.  08/2007 s/p MDT [place] PPM.  . Seizures ([place])   . Shingles   . Skin cancer     face, ear  and left hand.  . Tachy-brady syndrome ([place])    a. Severe in the setting of permanent atrial fibrillation with tachybrady syndrome;  b. 08/2007 s/p MDT [place] PPM.  . Thoracic aneurysm    a. 10/2011 CTA: 5.0 x 3.8 cm.    Past Surgical History:  Procedure Laterality Date  . ABDOMINAL AORTIC ANEURYSM REPAIR     Resection  . AORTIC VALVE REPLACEMENT     With bioprosthetic valve  . APPENDECTOMY    . BREAST BIOPSY    . CEREBRAL ANEURYSM REPAIR    . CHOLECYSTECTOMY    . LUNG BIOPSY  2005  . PACEMAKER PLACEMENT  08/2007   Medtronic; AF permanent; s/p pacemaker for bradycardia with now well controlled ventricular response; Digoxin therapy, question level; Lower extremity venous issues  . skin cancer removal     ear, face and hand  . VESICOVAGINAL FISTULA CLOSURE W/ TAH      Current Outpatient Prescriptions  Medication Sig Dispense Refill  . albuterol (PROVENTIL HFA;VENTOLIN HFA) 108 (90 Base) MCG/ACT inhaler Inhale 1 puff into the lungs every 6 (six) hours as needed for wheezing or shortness of breath.    [name] atenolol (TENORMIN) 100 MG tablet Take 0.5 tablets (50 mg total) by mouth at bedtime.    . busPIRone (BUSPAR) 5 MG tablet Take 0.5 tablets (  2.5 mg total) by mouth 2 (two) times daily. 30 tablet 0  . diclofenac sodium (VOLTAREN) 1 % GEL Apply 4 g topically 2 (two) times daily.    [name] HYDROcodone-acetaminophen (NORCO/VICODIN) 5-325 MG tablet Take 1 tablet by mouth every 6 (six) hours as needed for moderate pain.    [name] latanoprost (XALATAN) 0.005 % ophthalmic solution Place 1 drop into both eyes at bedtime.    [name] LORazepam (ATIVAN) 0.5 MG tablet Take 1 tablet (0.5 mg total) by mouth 2 (two) times daily. (Patient taking differently: Take 0.5 mg by mouth 3 (three) times daily. )    . meclizine (ANTIVERT) 25 MG tablet Take 12.5 mg by mouth 3 (three) times daily as needed for dizziness.    . ondansetron (ZOFRAN-ODT) 4 MG disintegrating tablet Take 4 mg by mouth every 8 (eight)  hours as needed for nausea or vomiting.    . pantoprazole (PROTONIX) 40 MG tablet Take 40 mg by mouth daily.    . potassium chloride SA (K-DUR,KLOR-CON) 20 MEQ tablet Take 1 tablet (20 mEq total) by mouth every evening. 90 tablet 3  . predniSONE (DELTASONE) 5 MG tablet Take 7.5 mg by mouth every other day. Polymyalgia rheumatica.    [name] torsemide (DEMADEX) 20 MG tablet Take 1.5 tablets (30 mg total) by mouth daily. 45 tablet 1  . traZODone (DESYREL) 50 MG tablet Take 50-100 mg by mouth at bedtime as needed for sleep.     [name] warfarin (COUMADIN) 5 MG tablet TAKE AS DIRECTED BY THE COUMADIN CLINIC 30 tablet 3   No current facility-administered medications for this visit.     Allergies  Allergen Reactions  . Fluticasone-Salmeterol Anaphylaxis  . Furosemide Shortness Of Breath  . Iodine Other (See Comments)    Reaction:  Fever and chills   . Iohexol Other (See Comments)    Desc:  Pt requires a 13 hr prep, when doing exam with contrast....fever, chills, dyspnea (slg/04/27/09--given 2 hour solumedrol/ benadryl prep--no reaction).    . Sulfonamide Derivatives Nausea And Vomiting  . Verapamil Nausea And Vomiting and Other (See Comments)    Reaction:  Loss of appetite   . Macrobid [Nitrofurantoin Monohyd Macro] Nausea Only and Other (See Comments)    Severe stomach pain  . Latex Rash  . Penicillins Rash and Other (See Comments)    Pt unable to answer additional questions about this medication.   Has patient had a PCN reaction causing immediate rash, facial/tongue/throat swelling, SOB or lightheadedness with hypotension: Yes Has patient had a PCN reaction causing severe rash involving mucus membranes or skin necrosis: UnknownHas patient had a PCN reaction that required hospitalization NoHas patient had a PCN reaction occurring within the last 10 years: No If all of the above answers are "NO", then may proceed with Cephalosporin    Review of Systems negative except from HPI and PMH  Physical  Exam BP (!) 146/74   Pulse 65   Ht 5\' 3"  (1.6 m)   Wt 143 lb (64.9 kg)   SpO2 97%   BMI 25.33 kg/m  Well developed and well nourished in no acute distress HENT normal E scleral and icterus clear Neck Supple Clear to ausculation  Regular rate and rhythm, no murmurs gallops or rub Soft with active bowel sounds; mild distention No clubbing cyanosis no Edema Alert and oriented, grossly normal motor and sensory function Skin Warm and Dry there is significant erythema and warmth over her right lateral leg.  ECG demonstrates what I think is sinus  rhythm atabout 20 bpm with ventricular pacing at 65 demonstrating ERI  Assessment and  Plan  HFpEF  Atrial fibrillation-permanent The patient's device was interrogated.  The information was reviewed. No changes were made in the programming.    Pacemaker-Medtronic  Device at [employer]   Hypertension    Her device has reached ERI and she has been quite symptomatic with this.  Euvolemic continue current meds  BP is reasonable   We have reviewed the benefits and risks of generator replacement.  These include but are not limited to lead fracture and infection.  The patient understands, agrees and is willing to proceed.

## 2016-08-20 NOTE — Discharge Instructions (Signed)

## 2016-08-21 ENCOUNTER — Encounter (HOSPITAL_COMMUNITY): Payer: Self-pay | Admitting: Internal Medicine

## 2016-08-22 MED FILL — Sodium Chloride Irrigation Soln 0.9%: Qty: 500 | Status: AC

## 2016-08-22 MED FILL — Gentamicin Sulfate Inj 40 MG/ML: INTRAMUSCULAR | Qty: 2 | Status: AC

## 2016-08-22 MED FILL — Midazolam HCl Inj 5 MG/5ML (Base Equivalent): INTRAMUSCULAR | Qty: 5 | Status: AC

## 2016-08-22 MED FILL — Fentanyl Citrate Preservative Free (PF) Inj 100 MCG/2ML: INTRAMUSCULAR | Qty: 2 | Status: AC

## 2016-08-22 MED FILL — Vancomycin HCl-Dextrose IV Soln 1 GM/200ML-5%: INTRAVENOUS | Qty: 200 | Status: AC

## 2016-08-27 ENCOUNTER — Encounter: Payer: Self-pay | Admitting: Internal Medicine

## 2016-08-27 ENCOUNTER — Telehealth: Payer: Self-pay | Admitting: Internal Medicine

## 2016-08-27 NOTE — Telephone Encounter (Signed)
I called and spoke with Maudie Mercury, I asked that she have the patient send [place] a transmission so we can review what might be going on with the patient's symptoms. I advised I will forward to the [facility] to be looking out for this so we can review with Dr. Caryl Comes. Maudie Mercury is agreeable to sending a transmission.

## 2016-08-27 NOTE — Telephone Encounter (Signed)
Spoke with [name]  Re symptoms and lack of improvement following device replacement As she ahd been in permanent atrial fibrillation not expected to improve mcuh

## 2016-08-27 NOTE — Telephone Encounter (Signed)
Remote reviewed. Presenting rhythm:Vp. No episodes recorded. Stable lead function.   Report given to [facility].

## 2016-08-27 NOTE — Telephone Encounter (Signed)
Received call transferred directly from operator and spoke with pt's niece. Maudie Mercury).  Niece reports pt has been short of breath and dizzy since generator change last week.  Symptoms worse since procedure.  They have oxygen saturation monitor and heart rate staying in low 60's.  Kim reports pt complains of feeling exhausted.  I told niece we would send message to Dr. Caryl Comes and call her back with his recommendations.  Maudie Mercury asks we contact her at [phone]

## 2016-08-27 NOTE — Telephone Encounter (Signed)
New message   Patient niece calling  C/o heart rate in very low 60    Pt C/O Shortness Of Breath: STAT if SOB developed within the last 24 hours or pt is noticeably SOB on the phone  1. Are you currently SOB (can you hear that pt is SOB on the phone)? niece calling - yes patient is sob.    2. How long have you been experiencing SOB? Per niece - Last week    3. Are you SOB when sitting or when up moving around?  Per niece - both    4. Are you currently experiencing any other symptoms? Per niece - patient c/o heart racing some.

## 2016-09-03 ENCOUNTER — Ambulatory Visit (INDEPENDENT_AMBULATORY_CARE_PROVIDER_SITE_OTHER): Payer: Medicare Other | Admitting: *Deleted

## 2016-09-03 ENCOUNTER — Ambulatory Visit: Payer: Medicare Other

## 2016-09-03 DIAGNOSIS — I359 Nonrheumatic aortic valve disorder, unspecified: Secondary | ICD-10-CM

## 2016-09-03 DIAGNOSIS — Z950 Presence of cardiac pacemaker: Secondary | ICD-10-CM

## 2016-09-03 DIAGNOSIS — I4891 Unspecified atrial fibrillation: Secondary | ICD-10-CM | POA: Diagnosis not present

## 2016-09-03 DIAGNOSIS — Z5181 Encounter for therapeutic drug level monitoring: Secondary | ICD-10-CM

## 2016-09-03 DIAGNOSIS — G459 Transient cerebral ischemic attack, unspecified: Secondary | ICD-10-CM | POA: Diagnosis not present

## 2016-09-03 DIAGNOSIS — Z953 Presence of xenogenic heart valve: Secondary | ICD-10-CM

## 2016-09-03 LAB — CUP PACEART INCLINIC DEVICE CHECK
Battery Impedance: 100 Ohm
Date Time Interrogation Session: 20171018173321
Implantable Lead Implant Date: 20081003
Lead Channel Impedance Value: 643 Ohm
Lead Channel Pacing Threshold Amplitude: 1 V
Lead Channel Pacing Threshold Pulse Width: 0.4 ms
Lead Channel Pacing Threshold Pulse Width: 0.4 ms
MDC IDC LEAD LOCATION: 753860
MDC IDC MSMT BATTERY REMAINING LONGEVITY: 122 mo
MDC IDC MSMT BATTERY VOLTAGE: 2.8 V
MDC IDC MSMT LEADCHNL RA IMPEDANCE VALUE: 0 Ohm
MDC IDC MSMT LEADCHNL RV PACING THRESHOLD AMPLITUDE: 1.125 V
MDC IDC SET LEADCHNL RV PACING AMPLITUDE: 2.25 V
MDC IDC SET LEADCHNL RV PACING PULSEWIDTH: 0.4 ms
MDC IDC SET LEADCHNL RV SENSING SENSITIVITY: 2.8 mV
MDC IDC STAT BRADY RV PERCENT PACED: 99 %

## 2016-09-03 LAB — POCT INR: INR: 2.9

## 2016-09-03 NOTE — Progress Notes (Signed)
  Wound check appointment. Dermabond removed. Normal device function. See photo in [place]. Threshold sensing, and impedance consistent with implant measurements. [name] distribution appropriate for patient and level of activity. No high ventricular rates noted. Patient educated about wound care. ROV with device clinic 09/11/2016 for wound recheck.

## 2016-09-11 ENCOUNTER — Ambulatory Visit (INDEPENDENT_AMBULATORY_CARE_PROVIDER_SITE_OTHER): Payer: Medicare Other | Admitting: *Deleted

## 2016-09-11 DIAGNOSIS — Z950 Presence of cardiac pacemaker: Secondary | ICD-10-CM

## 2016-09-11 NOTE — Progress Notes (Signed)
Wound recheck in clinic.  Incision edges well approximated.  Stitch encased by scab removed from L lateral incision, antibiotic ointment and bandaid applied.  Patient to keep bandaid in place until tomorrow.  Patient and niece aware to call if she develops any signs/symptoms of infection.  ROV with Device Clinic on 09/18/16 for wound recheck.

## 2016-09-18 ENCOUNTER — Ambulatory Visit (INDEPENDENT_AMBULATORY_CARE_PROVIDER_SITE_OTHER): Payer: Medicare Other | Admitting: *Deleted

## 2016-09-18 DIAGNOSIS — Z950 Presence of cardiac pacemaker: Secondary | ICD-10-CM

## 2016-09-18 NOTE — Progress Notes (Signed)
   Wound check, stitch removed from lateral right edge.

## 2016-10-01 ENCOUNTER — Ambulatory Visit (INDEPENDENT_AMBULATORY_CARE_PROVIDER_SITE_OTHER): Payer: Medicare Other

## 2016-10-01 DIAGNOSIS — I359 Nonrheumatic aortic valve disorder, unspecified: Secondary | ICD-10-CM

## 2016-10-01 DIAGNOSIS — Z953 Presence of xenogenic heart valve: Secondary | ICD-10-CM

## 2016-10-01 DIAGNOSIS — Z5181 Encounter for therapeutic drug level monitoring: Secondary | ICD-10-CM

## 2016-10-01 DIAGNOSIS — I4891 Unspecified atrial fibrillation: Secondary | ICD-10-CM | POA: Diagnosis not present

## 2016-10-01 DIAGNOSIS — G459 Transient cerebral ischemic attack, unspecified: Secondary | ICD-10-CM

## 2016-10-01 LAB — POCT INR: INR: 3.4

## 2016-10-07 ENCOUNTER — Encounter: Payer: Medicare Other | Admitting: Internal Medicine

## 2016-10-22 ENCOUNTER — Ambulatory Visit (INDEPENDENT_AMBULATORY_CARE_PROVIDER_SITE_OTHER): Payer: Medicare Other

## 2016-10-22 DIAGNOSIS — Z5181 Encounter for therapeutic drug level monitoring: Secondary | ICD-10-CM

## 2016-10-22 DIAGNOSIS — I359 Nonrheumatic aortic valve disorder, unspecified: Secondary | ICD-10-CM | POA: Diagnosis not present

## 2016-10-22 DIAGNOSIS — G459 Transient cerebral ischemic attack, unspecified: Secondary | ICD-10-CM | POA: Diagnosis not present

## 2016-10-22 DIAGNOSIS — I4891 Unspecified atrial fibrillation: Secondary | ICD-10-CM | POA: Diagnosis not present

## 2016-10-22 DIAGNOSIS — Z953 Presence of xenogenic heart valve: Secondary | ICD-10-CM

## 2016-10-22 LAB — POCT INR: INR: 2.9

## 2016-11-07 ENCOUNTER — Encounter: Payer: Self-pay | Admitting: Vascular Surgery

## 2016-11-21 ENCOUNTER — Encounter: Payer: Self-pay | Admitting: Cardiovascular Disease

## 2016-11-21 ENCOUNTER — Ambulatory Visit (INDEPENDENT_AMBULATORY_CARE_PROVIDER_SITE_OTHER): Payer: Medicare Other | Admitting: Cardiovascular Disease

## 2016-11-21 ENCOUNTER — Other Ambulatory Visit: Payer: Self-pay

## 2016-11-21 ENCOUNTER — Ambulatory Visit (INDEPENDENT_AMBULATORY_CARE_PROVIDER_SITE_OTHER): Payer: Medicare Other

## 2016-11-21 VITALS — BP 122/60 | HR 65 | Ht 63.0 in | Wt 140.0 lb

## 2016-11-21 DIAGNOSIS — Z953 Presence of xenogenic heart valve: Secondary | ICD-10-CM

## 2016-11-21 DIAGNOSIS — Z9889 Other specified postprocedural states: Secondary | ICD-10-CM

## 2016-11-21 DIAGNOSIS — Z8679 Personal history of other diseases of the circulatory system: Secondary | ICD-10-CM

## 2016-11-21 DIAGNOSIS — I5032 Chronic diastolic (congestive) heart failure: Secondary | ICD-10-CM

## 2016-11-21 DIAGNOSIS — Z5181 Encounter for therapeutic drug level monitoring: Secondary | ICD-10-CM | POA: Diagnosis not present

## 2016-11-21 DIAGNOSIS — Z950 Presence of cardiac pacemaker: Secondary | ICD-10-CM

## 2016-11-21 DIAGNOSIS — I714 Abdominal aortic aneurysm, without rupture, unspecified: Secondary | ICD-10-CM

## 2016-11-21 DIAGNOSIS — I4891 Unspecified atrial fibrillation: Secondary | ICD-10-CM

## 2016-11-21 DIAGNOSIS — G459 Transient cerebral ischemic attack, unspecified: Secondary | ICD-10-CM

## 2016-11-21 DIAGNOSIS — I712 Thoracic aortic aneurysm, without rupture, unspecified: Secondary | ICD-10-CM | POA: Insufficient documentation

## 2016-11-21 DIAGNOSIS — I359 Nonrheumatic aortic valve disorder, unspecified: Secondary | ICD-10-CM

## 2016-11-21 DIAGNOSIS — R110 Nausea: Secondary | ICD-10-CM

## 2016-11-21 DIAGNOSIS — R531 Weakness: Secondary | ICD-10-CM

## 2016-11-21 LAB — POCT INR: INR: 3.6

## 2016-11-21 MED ORDER — ATENOLOL 50 MG PO TABS: 50.0000 mg | ORAL_TABLET | Freq: Every day | ORAL | 3 refills | Status: DC

## 2016-11-21 NOTE — Patient Instructions (Signed)

## 2016-11-21 NOTE — Progress Notes (Signed)
Cardiology Office Note  Date:  11/21/2016   ID:  [name], DOB [birth year], MRN [id number]  PCP:  [name], MD   Chief Complaint  Patient presents with  . other    6 month follow up. Meds reviewed by the pt. verbally. "doing well."     HPI:  83 yo WF with history of tachy-brady syndrome s/p pacemaker, Permanent atrial fibrillation on warfarin, HTN, bioprosthetic AVR seen on echo June 2017, AAA with repair 4-5 years ago, and chronic diastolic heart failure,  right brachial arterial stenosis, with TIA in 2012,  presenting for routine followup of her chronic diastolic CHF, aneurysm and atrial fibrillation  No significant coronary artery disease or carotid arterial disease Echocardiogram in 2012 showing moderate MR and TR with moderate pulmonary hypertension She has significant peripheral vascular disease, Thoracic aortic aneurysm followed by Dr. Donnetta Hutching.  Treated with chronic prednisone for polymyalgia  In follow-up today she reports that she is doing well Reports that she recovered from pacemaker change out During this time she had 9 pound weight gain, shortness of breath Had to increase her torsemide up to 1-1/2 pills daily Dose was decreased by primary care down to 1 pill daily Weight has decreased from 149 pounds now 140 pounds Still with mild shortness of breath Weight on her prior clinic visit last year 136 pounds  She is very sedentary, does not coapt side the house very much, Niece helps to take care of her. Possible depression. No social activities, no regular walking or exercise Walks with a walker in the house  Not eating well, has chronic nausea, takes Zofran  CT 05/2016 reviewed with her in detail me images pulled up in the office today Thoracic aortic aneurysm, measuring 5.3 cm at the level of the proximal descending thoracic aorta, previously 4.5 cm  Echocardiogram April 2016 with significant pulmonary hypertension.  EKG on today's visit shows  atrial fibrillation, ventricularly paced rhythm  Other past medical history pneumonia in October 2015.   chronic knee pain Some shortness of breath but is not very active at baseline  History of severe COPD exacerbation, bronchitis. She was on Z-Pak, prednisone, started on albuterol inhaler She took several days of diuretics, torsemide,  with mild weight loss now down to 140 pounds. No significant edema. Symptoms improved  Previous Evaluation of her pacemaker report shows she is 60% RV paced  admitted to the hospital at [facility] on Apr 16 2011 with shortness of breath, acute on chronic diastolic heart failure, underlying hypertension.  possible  pneumonia. White blood cell count was 13. Antibiotics were discontinued after her chest x-ray cleared.  Numerous skin cancers over the past several years, on her legs, face, arms requiring resection History of arthritis in her knees requiring cortisone shot  cardiac catheterization over 5 years ago showing no significant coronary artery disease, recent CT scan showing stable descending aorta and after repair, minimal dilatation of the descending aorta, stable-appearing aortic valve.   PMH:   has a past medical history of AAA (abdominal aortic aneurysm) ([place]); Aortic insufficiency; Brain aneurysm; Chest pain; Chronic diastolic CHF (congestive heart failure) ([place]); Diverticulosis; Gastritis; Hemorrhoids; Hepatitis B (1980's); Hypertension; Moderate mitral regurgitation; Moderate tricuspid regurgitation; Permanent atrial fibrillation ([place]); Pulmonary hypertension; S/P aortic valve replacement with bioprosthetic valve; S/P placement of cardiac pacemaker; Seizures ([place]); Shingles; Skin cancer; Tachy-brady syndrome ([place]); and Thoracic aneurysm.  PSH:    Past Surgical History:  Procedure Laterality Date  . ABDOMINAL AORTIC ANEURYSM REPAIR  Resection  . AORTIC VALVE REPLACEMENT     With bioprosthetic valve  . APPENDECTOMY    . BREAST BIOPSY    .  CEREBRAL ANEURYSM REPAIR    . CHOLECYSTECTOMY    . EP IMPLANTABLE DEVICE N/A 08/20/2016   Procedure: PPM Generator Changeout;  Surgeon: Deboraha Sprang, MD;  Location: [facility];  Service: Cardiovascular;  Laterality: N/A;  . LUNG BIOPSY  2005  . PACEMAKER PLACEMENT  08/2007   Medtronic; AF permanent; s/p pacemaker for bradycardia with now well controlled ventricular response; Digoxin therapy, question level; Lower extremity venous issues  . skin cancer removal     ear, face and hand  . VESICOVAGINAL FISTULA CLOSURE W/ TAH      Current Outpatient Prescriptions  Medication Sig Dispense Refill  . acetaminophen (TYLENOL) 500 MG tablet Take 500 mg by mouth every 6 (six) hours as needed for mild pain.    [name] albuterol (PROVENTIL HFA;VENTOLIN HFA) 108 (90 Base) MCG/ACT inhaler Inhale 1 puff into the lungs every 6 (six) hours as needed for wheezing or shortness of breath.    [name] atenolol (TENORMIN) 50 MG tablet Take 1 tablet (50 mg total) by mouth daily. 90 tablet 3  . Brinzolamide-Brimonidine (SIMBRINZA) 1-0.2 % SUSP Apply 1 drop to eye every morning.    . busPIRone (BUSPAR) 7.5 MG tablet Take 7.5 mg by mouth 2 (two) times daily.    . diclofenac sodium (VOLTAREN) 1 % GEL Apply 4 g topically 2 (two) times daily.    . flintstones complete (FLINTSTONES) 60 MG chewable tablet Chew 1 tablet by mouth daily.    [name] HYDROcodone-acetaminophen (NORCO/VICODIN) 5-325 MG tablet Take 1 tablet by mouth every 6 (six) hours as needed for moderate pain.    [name] latanoprost (XALATAN) 0.005 % ophthalmic solution Place 1 drop into both eyes at bedtime.    [name] LORazepam (ATIVAN) 0.5 MG tablet Take 1 tablet (0.5 mg total) by mouth 2 (two) times daily. (Patient taking differently: Take 0.5 mg by mouth 3 (three) times daily as needed for anxiety. )    . meclizine (ANTIVERT) 25 MG tablet Take 12.5 mg by mouth 3 (three) times daily as needed for dizziness.    . ondansetron (ZOFRAN-ODT) 4 MG disintegrating tablet Take 4 mg by  mouth every 8 (eight) hours as needed for nausea or vomiting.    . pantoprazole (PROTONIX) 40 MG tablet Take 40 mg by mouth daily.    . potassium chloride SA (K-DUR,KLOR-CON) 20 MEQ tablet Take 1 tablet (20 mEq total) by mouth every evening. 90 tablet 3  . predniSONE (DELTASONE) 5 MG tablet Take 7.5 mg by mouth every other day. Polymyalgia rheumatica.    [name] torsemide (DEMADEX) 20 MG tablet Take 1.5 tablets (30 mg total) by mouth daily. 45 tablet 1  . traZODone (DESYREL) 50 MG tablet Take 50 mg by mouth at bedtime as needed for sleep.     [name] warfarin (COUMADIN) 5 MG tablet TAKE AS DIRECTED BY THE COUMADIN CLINIC (Patient taking differently: TAKES 2.5 MG TUESDAYS, THURSDAYS AND SATURDAYS AND 5 MG ALL OTHER DAYS OF THE WEEK) 30 tablet 3   No current facility-administered medications for this visit.      Allergies:   Fluticasone-salmeterol; Furosemide; Iodine; Iohexol; Sulfonamide derivatives; Verapamil; Macrobid [nitrofurantoin monohyd macro]; Latex; and Penicillins   Social History:  The patient  reports that she has never smoked. She has never used smokeless tobacco. She reports that she does not drink alcohol or use drugs.  Family History:   family history includes AAA (abdominal aortic aneurysm) in her sister; Cancer in her mother; Heart disease in her brother, father, mother, and sister; Hypertension in her brother, father, mother, and sister; Varicose Veins in her mother and sister.    Review of Systems: Review of Systems  Constitutional: Positive for malaise/fatigue.  Respiratory: Negative.   Cardiovascular: Negative.   Gastrointestinal: Negative.   Musculoskeletal: Negative.   Neurological: Positive for weakness.  Psychiatric/Behavioral: Negative.   All other systems reviewed and are negative.    PHYSICAL EXAM: VS:  BP 122/60 (BP Location: Left Arm, Patient Position: Sitting, Cuff Size: Normal)   Pulse 65   Ht 5\' 3"  (1.6 m)   Wt 140 lb (63.5 kg)   BMI 24.80 kg/m  , BMI Body  mass index is 24.8 kg/m. GEN: Well nourished, well developed, in no acute distress, presenting a wheelchair  HEENT: normal  Neck: no JVD, carotid bruits, or masses Cardiac: RRR; no murmurs, rubs, or gallops,no edema  Respiratory:  clear to auscultation bilaterally, normal work of breathing GI: soft, nontender, nondistended, + BS MS: no deformity or atrophy  Skin: warm and dry, no rash Neuro:  Strength and sensation are intact Psych: euthymic mood, full affect    Recent Labs: 05/10/2016: ALT 19; B Natriuretic Peptide 216.8 08/20/2016: BUN 23; Creatinine, Ser 1.08; Hemoglobin 15.0; Platelets 121; Potassium 3.6; Sodium 142    Lipid Panel Lab Results  Component Value Date   CHOL  12/19/2010    181        ATP III CLASSIFICATION:  <200     mg/dL   Desirable  200-239  mg/dL   Borderline High  >=240    mg/dL   High          HDL 43 12/19/2010   LDLCALC (H) 12/19/2010    120           TRIG 88 12/19/2010      Wt Readings from Last 3 Encounters:  11/21/16 140 lb (63.5 kg)  08/20/16 143 lb (64.9 kg)  08/14/16 143 lb (64.9 kg)       ASSESSMENT AND PLAN:  Atrial fibrillation, unspecified type ([employer]) - Plan: EKG 12-Lead  tolerating warfarin, no recent falls  Thoracic aortic aneurysm without rupture ([facility])  images shown to her, increase in size per the report Long discussion with her, she will follow-up with Dr. Sherren Mocha early She is not interested in surgery  DIASTOLIC HEART FAILURE, CHRONIC Weight down to 140 pounds and stable on torsemide 20 g daily  S/P AAA repair  seen on recent CT scan  Nausea Etiology unclear, recommended smaller more frequent meals Currently she takes Zofran with improvement of her symptoms  AVR, bioprosthetic Recent evaluation by echocardiogram June 2017 Functioning well, well-seated  Weakness   long discussion concerning her lack of activity, stays inside all day, does not go grocery shopping with family or do any social activities . Likely  leading to depression .   Disposition:   F/U  6 months   Orders Placed This Encounter  Procedures  . EKG 12-Lead     Signed, Esmond Plants, M.D., Ph.D. 11/21/2016  [place], [place] [phone]\

## 2016-11-25 ENCOUNTER — Encounter: Payer: Self-pay | Admitting: Vascular Surgery

## 2016-11-25 ENCOUNTER — Ambulatory Visit (INDEPENDENT_AMBULATORY_CARE_PROVIDER_SITE_OTHER): Payer: Medicare Other | Admitting: Internal Medicine

## 2016-11-25 ENCOUNTER — Encounter: Payer: Self-pay | Admitting: Internal Medicine

## 2016-11-25 ENCOUNTER — Ambulatory Visit (INDEPENDENT_AMBULATORY_CARE_PROVIDER_SITE_OTHER): Payer: Medicare Other | Admitting: Vascular Surgery

## 2016-11-25 ENCOUNTER — Ambulatory Visit (HOSPITAL_COMMUNITY)
Admission: RE | Admit: 2016-11-25 | Discharge: 2016-11-25 | Disposition: A | Discharge status: Home / Self Care | Payer: Medicare Other | Source: Ambulatory Visit | Attending: Vascular Surgery | Admitting: Vascular Surgery

## 2016-11-25 VITALS — BP 130/70 | HR 68 | Ht 63.0 in | Wt 140.2 lb

## 2016-11-25 VITALS — BP 118/66 | HR 63 | Temp 96.9°F | Resp 18 | Ht 63.0 in | Wt 140.2 lb

## 2016-11-25 DIAGNOSIS — I714 Abdominal aortic aneurysm, without rupture, unspecified: Secondary | ICD-10-CM

## 2016-11-25 DIAGNOSIS — I716 Thoracoabdominal aortic aneurysm, without rupture, unspecified: Secondary | ICD-10-CM

## 2016-11-25 DIAGNOSIS — Z950 Presence of cardiac pacemaker: Secondary | ICD-10-CM

## 2016-11-25 DIAGNOSIS — I482 Chronic atrial fibrillation: Secondary | ICD-10-CM | POA: Diagnosis not present

## 2016-11-25 DIAGNOSIS — I5030 Unspecified diastolic (congestive) heart failure: Secondary | ICD-10-CM

## 2016-11-25 DIAGNOSIS — I4821 Permanent atrial fibrillation: Secondary | ICD-10-CM

## 2016-11-25 LAB — CUP PACEART INCLINIC DEVICE CHECK
Date Time Interrogation Session: 20180109173406
Implantable Lead Implant Date: 20081003
Implantable Pulse Generator Implant Date: 20171004
Lead Channel Impedance Value: 647 Ohm
Lead Channel Pacing Threshold Amplitude: 0.875 V
Lead Channel Pacing Threshold Amplitude: 1 V
Lead Channel Pacing Threshold Pulse Width: 0.4 ms
Lead Channel Pacing Threshold Pulse Width: 0.4 ms
Lead Channel Setting Pacing Pulse Width: 0.4 ms
MDC IDC LEAD LOCATION: 753860
MDC IDC MSMT BATTERY IMPEDANCE: 108 Ohm
MDC IDC MSMT BATTERY REMAINING LONGEVITY: 120 mo
MDC IDC MSMT BATTERY VOLTAGE: 2.78 V
MDC IDC MSMT LEADCHNL RA IMPEDANCE VALUE: 0 Ohm
MDC IDC SET LEADCHNL RV PACING AMPLITUDE: 2.5 V
MDC IDC SET LEADCHNL RV SENSING SENSITIVITY: 2.8 mV
MDC IDC STAT BRADY RV PERCENT PACED: 100 %

## 2016-11-25 NOTE — Progress Notes (Signed)
Patient Care Team: Jodi Marble, MD as PCP - General (Internal Medicine) Minna Merritts, MD (Cardiology) Franchot Gallo, MD as Attending Physician (Urology) Frederik Pear, MD (Orthopedic Surgery) Deboraha Sprang, MD as Consulting Physician (Cardiology)   HPI  [name] is a 83 y.o. female Seen in followup for permanent pacer implanted in the setting of permanent atrial fibrillation and bradycardia. She is also status post bioprosthetic aortic valve replacement. She has prior TIA with her INR target 2.5-3.0  She underwent generator replacement 10/17 She's had no lightheadedness or syncope;  blood pressures at home have been in the 110 range.  She complains of exercise intolerance. This is stable.  Intercurrently she's been noted to have a thoracoabdominal aneurysm. The notes from Dr. early were reviewed. She is not a surgical candidate. It will not be followed up any further.    Echocardiogram in February 2012 showed ejection fraction 50-55%, pulmonary pressures 46 mm of mercury; mild aortic stenosis with intact aortic valve, bioprosthetic valve, otherwise normal study.  5/16  Myoview normal LV function without evidence of ischemia 6/17  Echo EF normal with normal  functioning aortic bioprosthesis     Past Medical History:  Diagnosis Date  . AAA (abdominal aortic aneurysm) ([place])    a. 12/2011 U/S: 3.3 cm prox AAA.  [name] Anxiety   . Aortic insufficiency    a. S/P bioprosthetic AVR;  b. 02/2013 Echo: EF 50-55%, mild to mod AI (mean grad 47mmHg).  . Brain aneurysm    Hx of  . Chest pain    a. H/o nonobs cath;  b. 07/2013 Lexiscan MV: EF 66%, no ischemia.  . Chronic diastolic CHF (congestive heart failure) ([place])    a. 02/2013 Echo: EF 50-55%.  . Diverticulosis   . Gastritis   . Hemorrhoids   . Hepatitis B 1980's  . Hiatal hernia   . Hypertension   . Moderate mitral regurgitation    a. 02/2013 Echo: Mod MR.  . Moderate tricuspid regurgitation    a. 02/2013 Echo: Mod TR.   [name] Permanent atrial fibrillation ([place])    a. Chronic coumadin.  . Polymyalgia rheumatica ([place])   . Pulmonary hypertension    a. 02/2013 Echo: PASP 62mmHg.  . S/P aortic valve replacement with bioprosthetic valve    a. [facility].  . S/P placement of cardiac pacemaker    a.  08/2007 s/p MDT [place] PPM.  . Seizures ([place])   . Shingles   . Skin cancer    face, ear  and left hand.  . Tachy-brady syndrome ([place])    a. Severe in the setting of permanent atrial fibrillation with tachybrady syndrome;  b. 08/2007 s/p MDT [place] PPM.  . Thoracic aneurysm    a. 10/2011 CTA: 5.0 x 3.8 cm.    Past Surgical History:  Procedure Laterality Date  . ABDOMINAL AORTIC ANEURYSM REPAIR     Resection  . AORTIC VALVE REPLACEMENT     With bioprosthetic valve  . APPENDECTOMY    . BREAST BIOPSY    . CEREBRAL ANEURYSM REPAIR    . CHOLECYSTECTOMY    . EP IMPLANTABLE DEVICE N/A 08/20/2016   Procedure: PPM Generator Changeout;  Surgeon: Deboraha Sprang, MD;  Location: [facility];  Service: Cardiovascular;  Laterality: N/A;  . LUNG BIOPSY  2005  . PACEMAKER PLACEMENT  08/2007   Medtronic; AF permanent; s/p pacemaker for bradycardia with now well controlled ventricular response; Digoxin therapy, question level; Lower  extremity venous issues  . skin cancer removal     ear, face and hand  . VESICOVAGINAL FISTULA CLOSURE W/ TAH      Current Outpatient Prescriptions  Medication Sig Dispense Refill  . acetaminophen (TYLENOL) 500 MG tablet Take 500 mg by mouth every 6 (six) hours as needed for mild pain.    [name] albuterol (PROVENTIL HFA;VENTOLIN HFA) 108 (90 Base) MCG/ACT inhaler Inhale 1 puff into the lungs every 6 (six) hours as needed for wheezing or shortness of breath.    [name] atenolol (TENORMIN) 50 MG tablet Take 1 tablet (50 mg total) by mouth daily. 90 tablet 3  . Brinzolamide-Brimonidine (SIMBRINZA) 1-0.2 % SUSP Apply 1 drop to eye every morning.    . busPIRone (BUSPAR) 7.5 MG  tablet Take 7.5 mg by mouth 2 (two) times daily.    . diclofenac sodium (VOLTAREN) 1 % GEL Apply 4 g topically 2 (two) times daily.    . flintstones complete (FLINTSTONES) 60 MG chewable tablet Chew 1 tablet by mouth daily.    [name] HYDROcodone-acetaminophen (NORCO/VICODIN) 5-325 MG tablet Take 1 tablet by mouth every 6 (six) hours as needed for moderate pain.    [name] latanoprost (XALATAN) 0.005 % ophthalmic solution Place 1 drop into both eyes at bedtime.    [name] LORazepam (ATIVAN) 0.5 MG tablet Take 1 tablet (0.5 mg total) by mouth 2 (two) times daily. (Patient taking differently: Take 0.5 mg by mouth 3 (three) times daily as needed for anxiety. )    . meclizine (ANTIVERT) 25 MG tablet Take 12.5 mg by mouth 3 (three) times daily as needed for dizziness.    . ondansetron (ZOFRAN-ODT) 4 MG disintegrating tablet Take 4 mg by mouth every 8 (eight) hours as needed for nausea or vomiting.    . pantoprazole (PROTONIX) 40 MG tablet Take 40 mg by mouth 2 (two) times daily.     . potassium chloride SA (K-DUR,KLOR-CON) 20 MEQ tablet Take 1 tablet (20 mEq total) by mouth every evening. 90 tablet 3  . predniSONE (DELTASONE) 5 MG tablet Take 7.5 mg by mouth every other day. Polymyalgia rheumatica.    [name] torsemide (DEMADEX) 20 MG tablet Take 1.5 tablets (30 mg total) by mouth daily. 45 tablet 1  . traZODone (DESYREL) 50 MG tablet Take 50 mg by mouth at bedtime as needed for sleep.     [name] warfarin (COUMADIN) 5 MG tablet TAKE AS DIRECTED BY THE COUMADIN CLINIC (Patient taking differently: TAKES 2.5 MG TUESDAYS, THURSDAYS AND SATURDAYS AND 5 MG ALL OTHER DAYS OF THE WEEK) 30 tablet 3   No current facility-administered medications for this visit.     Allergies  Allergen Reactions  . Fluticasone-Salmeterol Anaphylaxis  . Furosemide Shortness Of Breath  . Iodine Other (See Comments)    Reaction:  Fever and chills   . Iohexol Other (See Comments)    Desc:  Pt requires a 13 hr prep, when doing exam with contrast....fever,  chills, dyspnea (slg/04/27/09--given 2 hour solumedrol/ benadryl prep--no reaction).    . Sulfonamide Derivatives Nausea And Vomiting  . Verapamil Nausea And Vomiting and Other (See Comments)    Reaction:  Loss of appetite   . Macrobid [Nitrofurantoin Monohyd Macro] Nausea Only and Other (See Comments)    Severe stomach pain  . Latex Rash  . Penicillins Rash and Other (See Comments)    Pt unable to answer additional questions about this medication.   Has patient had a PCN reaction causing immediate rash, facial/tongue/throat swelling, SOB or  lightheadedness with hypotension: Yes Has patient had a PCN reaction causing severe rash involving mucus membranes or skin necrosis: UnknownHas patient had a PCN reaction that required hospitalization NoHas patient had a PCN reaction occurring within the last 10 years: No If all of the above answers are "NO", then may proceed with Cephalosporin    Review of Systems negative except from HPI and PMH  Physical Exam BP 130/70   Pulse 68   Ht 5\' 3"  (1.6 m)   Wt 140 lb 3.2 oz (63.6 kg)   SpO2 93%   BMI 24.84 kg/m  Well developed and well nourished in no acute distress sitting in her wheel chair HENT normal E scleral and icterus clear Neck Supple Clear to ausculation Device pocket well healed; without hematoma or erythema.  There is no tethering  Regular rate and rhythm, no murmurs gallops or rub Soft with active bowel sounds; mild distention No clubbing cyanosis   Edema Alert and oriented, grossly normal motor and sensory function Skin Warm and Dry  .  ECG demonstrateatrial fibrillation and ventricular pacing at 67  Assessment and  Plan  HFpEF  Atrial fibrillation-permanent The patient's device was interrogated.  The information was reviewed. No changes were made in the programming.    Pacemaker-Medtronic   The patient's device was interrogated.  The information was reviewed. No changes were made in the programming.     Hypertension   On  Anticoagulation;  No bleeding issues    Euvolemic continue current meds  BP is reasonable And in fact it may be a little bit low based on the blood pressures at home. We will have him check it again and let us know if it running less than 130 consistently, we will decrease her atenolol.  With her aneurysm would not want to discontinue it all together

## 2016-11-25 NOTE — Progress Notes (Signed)
[facility] [place]  Patient name: [name] MRN: [id number] DOB: [birth year] Sex: female  REASON FOR VISIT: Follow-up of thoracoabdominal aneurysm  HPI: [name] is a 83 y.o. female .  Today for follow-up of thoracoabdominal aneurysm.  I'd seen her 2 years ago when there was concern regarding potential severe peripheral vascular occlusive disease.  Partially she has had no progression of her lower extremity arterial insufficiency and has had no tissue loss.  She continues to have long history of GI upset.  She recently had imaging studies and an outlying facility and her aneurysm was seen and she was referred back to [place] for continued discussion of this.  I do not have access to those films.  I do have access of the prior CT angiogram of her chest from 2017 and contrast CT of abdomen and pelvis from April 2017. None large thoracoabdominal aneurysm begins in her transverse thoracic aortic arch and extends past her diaphragm.  She does not have dilatation of her infrarenal aorta although she does have extreme calcification.  She does not have any symptoms referable to mesenteric ischemia.  She has no pain associated with eating.  She and her family reports that she is very random onset of this abdominal discomfort and gas type pains.  Past Medical History:  Diagnosis Date  . AAA (abdominal aortic aneurysm) ([place])    a. 12/2011 U/S: 3.3 cm prox AAA.  [name] Anxiety   . Aortic insufficiency    a. S/P bioprosthetic AVR;  b. 02/2013 Echo: EF 50-55%, mild to mod AI (mean grad 26mmHg).  . Brain aneurysm    Hx of  . Chest pain    a. H/o nonobs cath;  b. 07/2013 Lexiscan MV: EF 66%, no ischemia.  . Chronic diastolic CHF (congestive heart failure) ([place])    a. 02/2013 Echo: EF 50-55%.  . Diverticulosis   . Gastritis   . Hemorrhoids   . Hepatitis B 1980's  . Hiatal hernia   . Hypertension   . Moderate mitral regurgitation    a. 02/2013  Echo: Mod MR.  . Moderate tricuspid regurgitation    a. 02/2013 Echo: Mod TR.  [name] Permanent atrial fibrillation ([place])    a. Chronic coumadin.  . Polymyalgia rheumatica ([place])   . Pulmonary hypertension    a. 02/2013 Echo: PASP 34mmHg.  . S/P aortic valve replacement with bioprosthetic valve    a. [facility]  . S/P placement of cardiac pacemaker    a.  08/2007 s/p MDT [place] PPM.  . Seizures ([place])   . Shingles   . Skin cancer    face, ear  and left hand.  . Tachy-brady syndrome ([place])    a. Severe in the setting of permanent atrial fibrillation with tachybrady syndrome;  b. 08/2007 s/p MDT [place] PPM.  . Thoracic aneurysm    a. 10/2011 CTA: 5.0 x 3.8 cm.    Family History  Problem Relation Age of Onset  . Cancer Mother   . Heart disease Mother   . Hypertension Mother   . Varicose Veins Mother   . Heart disease Father   . Hypertension Father   . Heart disease Sister   . Hypertension Sister   . Varicose Veins Sister   . AAA (abdominal aortic aneurysm) Sister   . Heart disease Brother   . Hypertension Brother     SOCIAL HISTORY: Social History  Substance Use Topics  . Smoking status:  Never Smoker  . Smokeless tobacco: Never Used  . Alcohol use No    Allergies  Allergen Reactions  . Fluticasone-Salmeterol Anaphylaxis  . Furosemide Shortness Of Breath  . Iodine Other (See Comments)    Reaction:  Fever and chills   . Iohexol Other (See Comments)    Desc:  Pt requires a 13 hr prep, when doing exam with contrast....fever, chills, dyspnea (slg/04/27/09--given 2 hour solumedrol/ benadryl prep--no reaction).    . Sulfonamide Derivatives Nausea And Vomiting  . Verapamil Nausea And Vomiting and Other (See Comments)    Reaction:  Loss of appetite   . Macrobid [Nitrofurantoin Monohyd Macro] Nausea Only and Other (See Comments)    Severe stomach pain  . Latex Rash  . Penicillins Rash and Other (See Comments)    Pt unable to answer additional questions  about this medication.   Has patient had a PCN reaction causing immediate rash, facial/tongue/throat swelling, SOB or lightheadedness with hypotension: Yes Has patient had a PCN reaction causing severe rash involving mucus membranes or skin necrosis: UnknownHas patient had a PCN reaction that required hospitalization NoHas patient had a PCN reaction occurring within the last 10 years: No If all of the above answers are "NO", then may proceed with Cephalosporin    Current Outpatient Prescriptions  Medication Sig Dispense Refill  . acetaminophen (TYLENOL) 500 MG tablet Take 500 mg by mouth every 6 (six) hours as needed for mild pain.    [name] albuterol (PROVENTIL HFA;VENTOLIN HFA) 108 (90 Base) MCG/ACT inhaler Inhale 1 puff into the lungs every 6 (six) hours as needed for wheezing or shortness of breath.    [name] atenolol (TENORMIN) 50 MG tablet Take 1 tablet (50 mg total) by mouth daily. 90 tablet 3  . Brinzolamide-Brimonidine (SIMBRINZA) 1-0.2 % SUSP Apply 1 drop to eye every morning.    . busPIRone (BUSPAR) 7.5 MG tablet Take 7.5 mg by mouth 2 (two) times daily.    . diclofenac sodium (VOLTAREN) 1 % GEL Apply 4 g topically 2 (two) times daily.    . flintstones complete (FLINTSTONES) 60 MG chewable tablet Chew 1 tablet by mouth daily.    [name] latanoprost (XALATAN) 0.005 % ophthalmic solution Place 1 drop into both eyes at bedtime.    [name] LORazepam (ATIVAN) 0.5 MG tablet Take 1 tablet (0.5 mg total) by mouth 2 (two) times daily. (Patient taking differently: Take 0.5 mg by mouth 3 (three) times daily as needed for anxiety. )    . meclizine (ANTIVERT) 25 MG tablet Take 12.5 mg by mouth 3 (three) times daily as needed for dizziness.    . ondansetron (ZOFRAN-ODT) 4 MG disintegrating tablet Take 4 mg by mouth every 8 (eight) hours as needed for nausea or vomiting.    . pantoprazole (PROTONIX) 40 MG tablet Take 40 mg by mouth 2 (two) times daily.     . potassium chloride SA (K-DUR,KLOR-CON) 20 MEQ tablet Take 1  tablet (20 mEq total) by mouth every evening. 90 tablet 3  . predniSONE (DELTASONE) 5 MG tablet Take 7.5 mg by mouth every other day. Polymyalgia rheumatica.    [name] torsemide (DEMADEX) 20 MG tablet Take 1.5 tablets (30 mg total) by mouth daily. 45 tablet 1  . traZODone (DESYREL) 50 MG tablet Take 50 mg by mouth at bedtime as needed for sleep.     [name] warfarin (COUMADIN) 5 MG tablet TAKE AS DIRECTED BY THE COUMADIN CLINIC (Patient taking differently: TAKES 2.5 MG TUESDAYS, THURSDAYS AND SATURDAYS AND 5 MG  ALL OTHER DAYS OF THE WEEK) 30 tablet 3  . HYDROcodone-acetaminophen (NORCO/VICODIN) 5-325 MG tablet Take 1 tablet by mouth every 6 (six) hours as needed for moderate pain.     No current facility-administered medications for this visit.     REVIEW OF SYSTEMS:  [X]  denotes positive finding, [ ]  denotes negative finding Cardiac  Comments:  Chest pain or chest pressure:    Shortness of breath upon exertion: x   Short of breath when lying flat:    Irregular heart rhythm:        Vascular    Pain in calf, thigh, or hip brought on by ambulation:    Pain in feet at night that wakes you up from your sleep:     Blood clot in your veins:    Leg swelling:           PHYSICAL EXAM: Vitals:   11/25/16 1209  BP: 118/66  Pulse: 63  Resp: 18  Temp: (!) 96.9 F (36.1 C)  TempSrc: Oral  SpO2: 95%  Weight: 140 lb 3.2 oz (63.6 kg)  Height: 5\' 3"  (1.6 m)    GENERAL: The patient is a well-nourished female, in no acute distress. The vital signs are documented above. CARDIOVASCULAR: Carotid arteries without bruits bilaterally.  2+ radial and 2+ femoral pulses.  I cannot palpate pedal pulses PULMONARY: There is good air exchange  MUSCULOSKELETAL: There are no major deformities or cyanosis. NEUROLOGIC: No focal weakness or paresthesias are detected. SKIN: There are no ulcers or rashes noted. PSYCHIATRIC: The patient has a normal affect.   Abdomen soft with no aneurysm palpable   DATA:  Ultrasound  today of her infrarenal aorta shows no aneurysmal dilatation with the maximal diameter of 2 cm  MEDICAL ISSUES: Again discussed the significance of her large thoracoabdominal aneurysm with the patient.  We had discussed this 2 years ago and had recommended no active follow-up since she clearly is not a candidate for treatment.  She recalls this discussion reports she had been referred back since she had recent imaging studies.  I explained the today's imaging shows no change in her infrarenal aorta but is never really had a concern at this location.  Do not have any recent imaging of her thoracic aorta.  Again would require open thoracoabdominal repair which he clearly would not be a candidate for.  She and her family are comfortable with continued observation without imaging follow-up.  She does understand that if this does rupture that it would be fatal.  We will see her again on an as-needed basis    Rosetta Posner, MD [facility] of [facility] Tel [phone] Pager [phone]

## 2016-11-25 NOTE — Patient Instructions (Signed)
Medication Instructions:    Your physician recommends that you continue on your current medications as directed. Please refer to the Current Medication list given to you today.  --- If you need a refill on your cardiac medications before your next appointment, please call your pharmacy. ---  Labwork:  None ordered  Testing/Procedures:  None ordered  Follow-Up: Remote monitoring is used to monitor your Pacemaker of ICD from home. This monitoring reduces the number of office visits required to check your device to one time per year. It allows [place] to keep an eye on the functioning of your device to ensure it is working properly. You are scheduled for a device check from home on 02/24/2017. You may send your transmission at any time that day. If you have a wireless device, the transmission will be sent automatically. After your physician reviews your transmission, you will receive a postcard with your next transmission date.   Your physician wants you to follow-up in: 9 months with Chanetta Marshall, NP.  You will receive a reminder letter in the mail two months in advance. If you don't receive a letter, please call our office to schedule the follow-up appointment.   Any Other Special Instructions Will Be Listed Below (If Applicable).  Please call the office in 3-4 weeks to report blood pressures.   Thank you for choosing [facility]!!   Trinidad Curet, RN [phone]

## 2016-12-04 ENCOUNTER — Emergency Department (HOSPITAL_COMMUNITY)
Admission: EM | Admit: 2016-12-04 | Discharge: 2016-12-04 | Disposition: A | Discharge status: Home / Self Care | Payer: Medicare Other | Attending: Emergency Medicine | Admitting: Emergency Medicine

## 2016-12-04 ENCOUNTER — Encounter (HOSPITAL_COMMUNITY): Payer: Self-pay | Admitting: Emergency Medicine

## 2016-12-04 DIAGNOSIS — Z950 Presence of cardiac pacemaker: Secondary | ICD-10-CM | POA: Diagnosis not present

## 2016-12-04 DIAGNOSIS — L723 Sebaceous cyst: Secondary | ICD-10-CM | POA: Diagnosis not present

## 2016-12-04 DIAGNOSIS — Z8673 Personal history of transient ischemic attack (TIA), and cerebral infarction without residual deficits: Secondary | ICD-10-CM | POA: Insufficient documentation

## 2016-12-04 DIAGNOSIS — I110 Hypertensive heart disease with heart failure: Secondary | ICD-10-CM | POA: Diagnosis not present

## 2016-12-04 DIAGNOSIS — I5032 Chronic diastolic (congestive) heart failure: Secondary | ICD-10-CM | POA: Diagnosis not present

## 2016-12-04 DIAGNOSIS — Z7901 Long term (current) use of anticoagulants: Secondary | ICD-10-CM | POA: Insufficient documentation

## 2016-12-04 DIAGNOSIS — Z85828 Personal history of other malignant neoplasm of skin: Secondary | ICD-10-CM | POA: Diagnosis not present

## 2016-12-04 DIAGNOSIS — Z91040 Latex allergy status: Secondary | ICD-10-CM | POA: Diagnosis not present

## 2016-12-04 DIAGNOSIS — L0211 Cutaneous abscess of neck: Secondary | ICD-10-CM | POA: Diagnosis present

## 2016-12-04 MED ORDER — DOXYCYCLINE HYCLATE 100 MG PO CAPS: 100.0000 mg | ORAL_CAPSULE | Freq: Two times a day (BID) | ORAL | 0 refills | Status: DC

## 2016-12-04 NOTE — ED Triage Notes (Signed)
Pt reports she has had a cyst on the R lateral side of neck for the past year. Went to dermatologist for this and was told she would need a consult to surgery for removal. However, in the past few days the area of concern has had increased swelling and now is draining.

## 2016-12-04 NOTE — ED Provider Notes (Signed)
[facility] DEPT Provider Note   CSN: [id number] Arrival date & time: 12/04/16  1331     History   Chief Complaint Chief Complaint  Patient presents with  . Abscess    lateral neck    HPI [name] is a 83 y.o. female.  83 yo F with a cc of right neck cyst.  Chronic issue but rapidly worsening over the past 48 hours. Denies fevers denies chills denies difficulty with swallowing or speech. Because of the pain radiates to her right ear. Denies nausea. Has had this evaluated previously by her dermatologist recommended she see a surgeon to get it removed. Patient has not yet done this. Has been having some drainage from the area.   The history is provided by the patient.  Abscess  Location:  Head/neck Head/neck abscess location:  R neck Size:  Quarter sized Abscess quality: draining   Red streaking: no   Duration:  12 months Progression:  Worsening Chronicity:  New Context: diabetes   Relieved by:  Nothing Worsened by:  Nothing Ineffective treatments:  None tried Associated symptoms: no fever, no headaches, no nausea and no vomiting     Past Medical History:  Diagnosis Date  . AAA (abdominal aortic aneurysm) ([place])    a. 12/2011 U/S: 3.3 cm prox AAA.  [name] Anxiety   . Aortic insufficiency    a. S/P bioprosthetic AVR;  b. 02/2013 Echo: EF 50-55%, mild to mod AI (mean grad 75mmHg).  . Brain aneurysm    Hx of  . Chest pain    a. H/o nonobs cath;  b. 07/2013 Lexiscan MV: EF 66%, no ischemia.  . Chronic diastolic CHF (congestive heart failure) ([place])    a. 02/2013 Echo: EF 50-55%.  . Diverticulosis   . Gastritis   . Hemorrhoids   . Hepatitis B 1980's  . Hiatal hernia   . Hypertension   . Moderate mitral regurgitation    a. 02/2013 Echo: Mod MR.  . Moderate tricuspid regurgitation    a. 02/2013 Echo: Mod TR.  [name] Permanent atrial fibrillation ([place])    a. Chronic coumadin.  . Polymyalgia rheumatica ([place])   . Pulmonary hypertension    a. 02/2013 Echo: PASP 69mmHg.  .  S/P aortic valve replacement with bioprosthetic valve    a. [facility]  . S/P placement of cardiac pacemaker    a.  08/2007 s/p MDT [place] PPM.  . Seizures ([place])   . Shingles   . Skin cancer    face, ear  and left hand.  . Tachy-brady syndrome ([place])    a. Severe in the setting of permanent atrial fibrillation with tachybrady syndrome;  b. 08/2007 s/p MDT [place] PPM.  . Thoracic aneurysm    a. 10/2011 CTA: 5.0 x 3.8 cm.    Patient Active Problem List   Diagnosis Date Noted  . Thoracic aortic aneurysm without rupture ([place]) 11/21/2016  . Complete heart block ([place]) 08/20/2016  . CHF (congestive heart failure) ([place]) 05/10/2016  . Acute respiratory failure ([place]) 05/10/2016  . Rheumatoid arthritis ([place]) 05/10/2016  . Insomnia 05/10/2016  . Nausea 03/31/2016  . Pain in the muscles 08/31/2015  . S/P AAA repair 03/27/2015  . Tachy-brady syndrome ([place]) 03/27/2015  . Constipation 03/27/2015  . Dizziness 11/01/2014  . Pneumonia, organism unspecified(486) 11/01/2014  . Fatigue 02/27/2014  . Pulmonary hypertension 01/26/2014  . Encounter for therapeutic drug monitoring 12/15/2013  . Bronchitis 12/15/2013  . Frequent PVCs 12/15/2013  . Exertional angina (  [place]) 07/07/2013  . Bradycardia 07/07/2013  . Ulcer of other part of lower limb 12/28/2012  . Hyperlipidemia 12/26/2010  . TIA 12/26/2010  . PACEMAKER-Medtronic 05/07/2010  . S/P aortic valve replacement with bioprosthetic valve 01/15/2010  . MURMUR 01/15/2010  . DIASTOLIC HEART FAILURE, CHRONIC 10/26/2009  . PVD 07/17/2009  . ATRIAL FIBRILLATION 06/19/2009  . DIZZINESS AND GIDDINESS 06/19/2009    Past Surgical History:  Procedure Laterality Date  . ABDOMINAL AORTIC ANEURYSM REPAIR     Resection  . AORTIC VALVE REPLACEMENT     With bioprosthetic valve  . APPENDECTOMY    . BREAST BIOPSY    . CEREBRAL ANEURYSM REPAIR    . CHOLECYSTECTOMY    . EP IMPLANTABLE DEVICE N/A 08/20/2016   Procedure: PPM  Generator Changeout;  Surgeon: Deboraha Sprang, MD;  Location: [facility];  Service: Cardiovascular;  Laterality: N/A;  . LUNG BIOPSY  2005  . PACEMAKER PLACEMENT  08/2007   Medtronic; AF permanent; s/p pacemaker for bradycardia with now well controlled ventricular response; Digoxin therapy, question level; Lower extremity venous issues  . skin cancer removal     ear, face and hand  . VESICOVAGINAL FISTULA CLOSURE W/ TAH      OB History    Gravida Para Term Preterm AB Living   0 0 0 0 0 0   SAB TAB Ectopic Multiple Live Births   0 0 0 0         Home Medications    Prior to Admission medications   Medication Sig Start Date End Date Taking? Authorizing Provider  acetaminophen (TYLENOL) 500 MG tablet Take 500 mg by mouth every 6 (six) hours as needed for mild pain.    Historical Provider, MD  albuterol (PROVENTIL HFA;VENTOLIN HFA) 108 (90 Base) MCG/ACT inhaler Inhale 1 puff into the lungs every 6 (six) hours as needed for wheezing or shortness of breath.    Historical Provider, MD  atenolol (TENORMIN) 50 MG tablet Take 1 tablet (50 mg total) by mouth daily. 11/21/16   Minna Merritts, MD  Brinzolamide-Brimonidine [facility]) 1-0.2 % SUSP Apply 1 drop to eye every morning.    Historical Provider, MD  busPIRone (BUSPAR) 7.5 MG tablet Take 7.5 mg by mouth 2 (two) times daily.    Historical Provider, MD  diclofenac sodium (VOLTAREN) 1 % GEL Apply 4 g topically 2 (two) times daily.    Historical Provider, MD  doxycycline (VIBRAMYCIN) 100 MG capsule Take 1 capsule (100 mg total) by mouth 2 (two) times daily. One po bid x 7 days 12/04/16   Deno Etienne, DO  flintstones complete (FLINTSTONES) 60 MG chewable tablet Chew 1 tablet by mouth daily.    Historical Provider, MD  HYDROcodone-acetaminophen (NORCO/VICODIN) 5-325 MG tablet Take 1 tablet by mouth every 6 (six) hours as needed for moderate pain.    Historical Provider, MD  latanoprost (XALATAN) 0.005 % ophthalmic solution Place 1 drop into  both eyes at bedtime.    Historical Provider, MD  LORazepam (ATIVAN) 0.5 MG tablet Take 1 tablet (0.5 mg total) by mouth 2 (two) times daily. Patient taking differently: Take 0.5 mg by mouth 3 (three) times daily as needed for anxiety.  05/14/16   Barton Dubois, MD  meclizine (ANTIVERT) 25 MG tablet Take 12.5 mg by mouth 3 (three) times daily as needed for dizziness.    Historical Provider, MD  ondansetron (ZOFRAN-ODT) 4 MG disintegrating tablet Take 4 mg by mouth every 8 (eight) hours as needed for nausea  or vomiting.    Historical Provider, MD  pantoprazole (PROTONIX) 40 MG tablet Take 40 mg by mouth 2 (two) times daily.     Historical Provider, MD  potassium chloride SA (K-DUR,KLOR-CON) 20 MEQ tablet Take 1 tablet (20 mEq total) by mouth every evening. 12/07/15   Minna Merritts, MD  predniSONE (DELTASONE) 5 MG tablet Take 7.5 mg by mouth every other day. Polymyalgia rheumatica.    Historical Provider, MD  torsemide (DEMADEX) 20 MG tablet Take 1.5 tablets (30 mg total) by mouth daily. 05/14/16   Barton Dubois, MD  traZODone (DESYREL) 50 MG tablet Take 50 mg by mouth at bedtime as needed for sleep.     Historical Provider, MD  warfarin (COUMADIN) 5 MG tablet TAKE AS DIRECTED BY THE COUMADIN CLINIC Patient taking differently: TAKES 2.5 MG TUESDAYS, THURSDAYS AND SATURDAYS AND 5 MG ALL OTHER DAYS OF THE WEEK 07/22/16   Deboraha Sprang, MD    Family History Family History  Problem Relation Age of Onset  . Cancer Mother   . Heart disease Mother   . Hypertension Mother   . Varicose Veins Mother   . Heart disease Father   . Hypertension Father   . Heart disease Sister   . Hypertension Sister   . Varicose Veins Sister   . AAA (abdominal aortic aneurysm) Sister   . Heart disease Brother   . Hypertension Brother     Social History Social History  Substance Use Topics  . Smoking status: Never Smoker  . Smokeless tobacco: Never Used  . Alcohol use No     Allergies   Fluticasone-salmeterol;  Furosemide; Iodine; Iohexol; Sulfonamide derivatives; Verapamil; Macrobid [nitrofurantoin monohyd macro]; Latex; and Penicillins   Review of Systems Review of Systems  Constitutional: Negative for chills and fever.  HENT: Negative for congestion and rhinorrhea.   Eyes: Negative for redness and visual disturbance.  Respiratory: Negative for shortness of breath and wheezing.   Cardiovascular: Negative for chest pain and palpitations.  Gastrointestinal: Negative for nausea and vomiting.  Genitourinary: Negative for dysuria and urgency.  Musculoskeletal: Negative for arthralgias and myalgias.  Skin: Positive for wound. Negative for pallor.  Neurological: Negative for dizziness and headaches.     Physical Exam Updated Vital Signs BP (!) 138/54   Pulse 62   Temp 97.5 F (36.4 C) (Oral)   Resp 16   SpO2 95%   Physical Exam  Constitutional: She is oriented to person, place, and time. She appears well-developed and well-nourished. No distress.  HENT:  Head: Normocephalic and atraumatic.  Eyes: EOM are normal. Pupils are equal, round, and reactive to light.  Neck: Normal range of motion. Neck supple.  Cardiovascular: Normal rate and regular rhythm.  Exam reveals no gallop and no friction rub.   No murmur heard. Pulmonary/Chest: Effort normal. She has no wheezes. She has no rales.  Abdominal: Soft. She exhibits no distension. There is no tenderness.  Musculoskeletal: She exhibits no edema or tenderness.  Neurological: She is alert and oriented to person, place, and time.  Skin: Skin is warm and dry. She is not diaphoretic.  Mass to the right lateral aspect of the neck just posterior to the clavicular attachment of the SCM. Actively draining with sebaceous material extruding.  Psychiatric: She has a normal mood and affect. Her behavior is normal.  Nursing note and vitals reviewed.    ED Treatments / Results  Labs (all labs ordered are listed, but only abnormal results are  displayed) Labs Reviewed -  No data to display  EKG  EKG Interpretation None       Radiology No results found.  Procedures .Marland KitchenIncision and Drainage Date/Time: 12/04/2016 4:07 PM Performed by: Tyrone Nine Amiaya Mcneeley Authorized by: Deno Etienne   Consent:    Consent obtained:  Verbal   Consent given by:  Patient   Risks discussed:  Incomplete drainage   Alternatives discussed:  No treatment Location:    Type:  Cyst   Size:  .5cm   Location:  Neck   Neck location:  R anterior Anesthesia (see MAR for exact dosages):    Anesthesia method:  None Procedure type:    Complexity:  Complex Procedure details:    Needle aspiration: no     Wound management:  Probed and deloculated   Drainage:  Bloody (sebaceous)   Drainage amount:  Copious   Wound treatment:  Wound left open   Packing materials:  None Post-procedure details:    Patient tolerance of procedure:  Tolerated well, no immediate complications   (including critical care time)  Medications Ordered in ED Medications - No data to display   Initial Impression / Assessment and Plan / ED Course  I have reviewed the triage vital signs and the nursing notes.  Pertinent labs & imaging results that were available during my care of the patient were reviewed by me and considered in my medical decision making (see chart for details).     83 yo F With a chief complaints of a right neck cyst. This appears to be a sebaceous cyst. Lots of sebaceous material was drained from the area. With mild erythema will start on antibiotics. Given ENT follow-up.  4:11 PM:  I have discussed the diagnosis/risks/treatment options with the patient and family and believe the pt to be eligible for discharge home to follow-up with ENT. We also discussed returning to the ED immediately if new or worsening sx occur. We discussed the sx which are most concerning (e.g., sudden worsening pain, fever, inability to tolerate by mouth) that necessitate immediate return.  Medications administered to the patient during their visit and any new prescriptions provided to the patient are listed below.  Medications given during this visit Medications - No data to display   The patient appears reasonably screen and/or stabilized for discharge and I doubt any other medical condition or other [facility] requiring further screening, evaluation, or treatment in the ED at this time prior to discharge.    Final Clinical Impressions(s) / ED Diagnoses   Final diagnoses:  Sebaceous cyst    New Prescriptions New Prescriptions   DOXYCYCLINE (VIBRAMYCIN) 100 MG CAPSULE    Take 1 capsule (100 mg total) by mouth 2 (two) times daily. One po bid x 7 days     Deno Etienne, DO 12/04/16 1611

## 2016-12-04 NOTE — Discharge Instructions (Signed)
Warm compresses 4 x a day.  Call your ENT today.

## 2016-12-04 NOTE — ED Notes (Signed)
ED Provider at bedside. 

## 2016-12-15 ENCOUNTER — Other Ambulatory Visit: Payer: Self-pay | Admitting: Cardiovascular Disease

## 2016-12-17 ENCOUNTER — Ambulatory Visit (INDEPENDENT_AMBULATORY_CARE_PROVIDER_SITE_OTHER): Payer: Medicare Other

## 2016-12-17 DIAGNOSIS — Z953 Presence of xenogenic heart valve: Secondary | ICD-10-CM

## 2016-12-17 DIAGNOSIS — I359 Nonrheumatic aortic valve disorder, unspecified: Secondary | ICD-10-CM | POA: Diagnosis not present

## 2016-12-17 DIAGNOSIS — Z5181 Encounter for therapeutic drug level monitoring: Secondary | ICD-10-CM

## 2016-12-17 DIAGNOSIS — I4891 Unspecified atrial fibrillation: Secondary | ICD-10-CM

## 2016-12-17 DIAGNOSIS — G459 Transient cerebral ischemic attack, unspecified: Secondary | ICD-10-CM | POA: Diagnosis not present

## 2016-12-17 LAB — POCT INR: INR: 3.7

## 2017-01-01 ENCOUNTER — Other Ambulatory Visit: Payer: Self-pay | Admitting: Internal Medicine

## 2017-01-01 ENCOUNTER — Other Ambulatory Visit: Payer: Self-pay | Admitting: Cardiovascular Disease

## 2017-01-02 NOTE — Telephone Encounter (Signed)
Pt requesting torsemide 20 mg tablet refill, Please advise if ok to refill?

## 2017-01-06 ENCOUNTER — Telehealth: Payer: Self-pay | Admitting: Cardiovascular Disease

## 2017-01-06 NOTE — Telephone Encounter (Signed)
Error

## 2017-01-07 ENCOUNTER — Other Ambulatory Visit: Payer: Self-pay

## 2017-01-07 ENCOUNTER — Ambulatory Visit (INDEPENDENT_AMBULATORY_CARE_PROVIDER_SITE_OTHER): Payer: Medicare Other

## 2017-01-07 DIAGNOSIS — I4891 Unspecified atrial fibrillation: Secondary | ICD-10-CM

## 2017-01-07 DIAGNOSIS — I359 Nonrheumatic aortic valve disorder, unspecified: Secondary | ICD-10-CM | POA: Diagnosis not present

## 2017-01-07 DIAGNOSIS — Z5181 Encounter for therapeutic drug level monitoring: Secondary | ICD-10-CM

## 2017-01-07 DIAGNOSIS — Z953 Presence of xenogenic heart valve: Secondary | ICD-10-CM | POA: Diagnosis not present

## 2017-01-07 DIAGNOSIS — G459 Transient cerebral ischemic attack, unspecified: Secondary | ICD-10-CM | POA: Diagnosis not present

## 2017-01-07 LAB — POCT INR: INR: 2.5

## 2017-01-07 MED ORDER — TORSEMIDE 20 MG PO TABS: 30.0000 mg | ORAL_TABLET | Freq: Every day | ORAL | 1 refills | Status: DC

## 2017-01-07 NOTE — Telephone Encounter (Signed)
Please review for refill. Thanks!  

## 2017-02-03 ENCOUNTER — Telehealth: Payer: Self-pay | Admitting: Cardiovascular Disease

## 2017-02-03 NOTE — Telephone Encounter (Signed)
pt daughter calling stating pt complaining of having "sinking spells"  Pt states while sitting down and all the sudden she can feel like she is sinking  Her BP during those times are usually around 100/60 Or even lower than that  She been having them more about the last 3 weeks But increased more in the past 5 days Pt pcp placed her on Atenolol 25 mg, It was 50 mg, not sure if that could be causing it Would like some advise on this

## 2017-02-03 NOTE — Telephone Encounter (Signed)
Spoke w/ pt.  She reports that her atenolol was recently decreased from 50 mg daily to 25 mg, though she does not know who advised this and I do not see documentation in her chart.  She reports that she has been having frequent spells where she has a sinking feeling and that her BP has been running low and she feels that her heart is racing.  When she checks it, her BP has been 110/60 and 90/47, HR in the 60s. She reports that yesterday was the best day she has had in quite some time, but today she feels bad. Reports dizziness and no energy during her episodes. She would like to know if she should hold her atenolol completely or if Dr. Rockey Situ has any other recommendations. She is sched for coumadin check tomorrow @ 3:00.

## 2017-02-04 ENCOUNTER — Ambulatory Visit (INDEPENDENT_AMBULATORY_CARE_PROVIDER_SITE_OTHER): Payer: Medicare Other

## 2017-02-04 DIAGNOSIS — I4891 Unspecified atrial fibrillation: Secondary | ICD-10-CM

## 2017-02-04 DIAGNOSIS — Z953 Presence of xenogenic heart valve: Secondary | ICD-10-CM | POA: Diagnosis not present

## 2017-02-04 DIAGNOSIS — Z5181 Encounter for therapeutic drug level monitoring: Secondary | ICD-10-CM | POA: Diagnosis not present

## 2017-02-04 DIAGNOSIS — I359 Nonrheumatic aortic valve disorder, unspecified: Secondary | ICD-10-CM

## 2017-02-04 DIAGNOSIS — G459 Transient cerebral ischemic attack, unspecified: Secondary | ICD-10-CM

## 2017-02-04 LAB — POCT INR: INR: 2.1

## 2017-02-04 NOTE — Telephone Encounter (Signed)
She could hold the atenolol and monitor her numbers closely, see if this helps her symptoms Etiology is unclear

## 2017-02-05 NOTE — Telephone Encounter (Signed)
Left message on [name]'s vm w/ Dr. Donivan Scull recommendation. Spoke w/ pt's son, as pt is still in bed. Advised him of Dr. Donivan Scull recommendation.  Asked them to call back w/ any questions or concerns.

## 2017-02-24 ENCOUNTER — Telehealth: Payer: Self-pay | Admitting: Cardiology

## 2017-02-24 ENCOUNTER — Ambulatory Visit (INDEPENDENT_AMBULATORY_CARE_PROVIDER_SITE_OTHER): Payer: Medicare Other | Admitting: *Deleted

## 2017-02-24 DIAGNOSIS — I495 Sick sinus syndrome: Secondary | ICD-10-CM

## 2017-02-24 NOTE — Telephone Encounter (Signed)
Confirmed remote transmission w/ pt niece.   

## 2017-02-24 NOTE — Progress Notes (Signed)
Remote pacemaker transmission.   

## 2017-02-25 ENCOUNTER — Ambulatory Visit (INDEPENDENT_AMBULATORY_CARE_PROVIDER_SITE_OTHER): Payer: Medicare Other

## 2017-02-25 ENCOUNTER — Encounter: Payer: Self-pay | Admitting: Cardiology

## 2017-02-25 DIAGNOSIS — Z5181 Encounter for therapeutic drug level monitoring: Secondary | ICD-10-CM | POA: Diagnosis not present

## 2017-02-25 DIAGNOSIS — I4891 Unspecified atrial fibrillation: Secondary | ICD-10-CM | POA: Diagnosis not present

## 2017-02-25 DIAGNOSIS — Z953 Presence of xenogenic heart valve: Secondary | ICD-10-CM | POA: Diagnosis not present

## 2017-02-25 LAB — POCT INR: INR: 2.3

## 2017-02-27 LAB — CUP PACEART REMOTE DEVICE CHECK
Battery Impedance: 131 Ohm
Battery Remaining Longevity: 114 mo
Battery Voltage: 2.79 V
Brady Statistic RV Percent Paced: 100 %
Implantable Lead Model: 5076
Lead Channel Impedance Value: 0 Ohm
Lead Channel Pacing Threshold Amplitude: 1 V
Lead Channel Setting Pacing Amplitude: 2.5 V
Lead Channel Setting Pacing Pulse Width: 0.4 ms
MDC IDC LEAD IMPLANT DT: 20081003
MDC IDC LEAD LOCATION: 753860
MDC IDC MSMT LEADCHNL RV IMPEDANCE VALUE: 671 Ohm
MDC IDC MSMT LEADCHNL RV PACING THRESHOLD PULSEWIDTH: 0.4 ms
MDC IDC PG IMPLANT DT: 20171004
MDC IDC SESS DTM: 20180410150015
MDC IDC SET LEADCHNL RV SENSING SENSITIVITY: 2.8 mV

## 2017-03-12 ENCOUNTER — Encounter: Payer: Self-pay | Admitting: Cardiology

## 2017-03-18 ENCOUNTER — Ambulatory Visit (INDEPENDENT_AMBULATORY_CARE_PROVIDER_SITE_OTHER): Payer: Medicare Other | Admitting: *Deleted

## 2017-03-18 DIAGNOSIS — Z953 Presence of xenogenic heart valve: Secondary | ICD-10-CM

## 2017-03-18 DIAGNOSIS — I4891 Unspecified atrial fibrillation: Secondary | ICD-10-CM

## 2017-03-18 DIAGNOSIS — Z5181 Encounter for therapeutic drug level monitoring: Secondary | ICD-10-CM | POA: Diagnosis not present

## 2017-03-18 LAB — POCT INR: INR: 2

## 2017-03-27 ENCOUNTER — Other Ambulatory Visit: Payer: Self-pay | Admitting: Cardiovascular Disease

## 2017-04-01 ENCOUNTER — Ambulatory Visit (INDEPENDENT_AMBULATORY_CARE_PROVIDER_SITE_OTHER): Payer: Medicare Other | Admitting: *Deleted

## 2017-04-01 DIAGNOSIS — Z5181 Encounter for therapeutic drug level monitoring: Secondary | ICD-10-CM

## 2017-04-01 DIAGNOSIS — Z953 Presence of xenogenic heart valve: Secondary | ICD-10-CM | POA: Diagnosis not present

## 2017-04-01 DIAGNOSIS — I4891 Unspecified atrial fibrillation: Secondary | ICD-10-CM

## 2017-04-01 LAB — POCT INR: INR: 2.1

## 2017-04-09 ENCOUNTER — Encounter: Payer: Self-pay | Admitting: Cardiovascular Disease

## 2017-04-09 ENCOUNTER — Telehealth: Payer: Self-pay | Admitting: Cardiovascular Disease

## 2017-04-09 NOTE — Telephone Encounter (Signed)
[facility] calling [phone] ex [phone]  Pt is there in office with them  Stating patient Told them she's had dizzy/racing heart/neasuea spells  for about a week Patient told them she did a CT that may have fractured her ribs  this was done about 6 weeks ago   They are about to do an EKG/ x ray and they will fax those over to [place] as soon as they do it  Please call back   I will keep an eye on EKG in the faxes  They felt patient should be seen here in office ASAP for these issues

## 2017-04-09 NOTE — Telephone Encounter (Signed)
Spoke with Olin Hauser at [facility] and she wanted to see when we could get the patient in to be seen. Scheduled her to come in on 04/14/17 at 09:00AM to see Dr. Caryl Comes. I did advise that if her symptoms persist or worsen to go to ED for further evaluation. She verbalized understanding of our conversation, confirmed appointment information, and ahd no further questions at this time. Will route this to Dr. Rockey Situ and Nira Conn for Dr. Caryl Comes.

## 2017-04-14 ENCOUNTER — Ambulatory Visit (INDEPENDENT_AMBULATORY_CARE_PROVIDER_SITE_OTHER): Payer: Medicare Other

## 2017-04-14 ENCOUNTER — Encounter: Payer: Self-pay | Admitting: Internal Medicine

## 2017-04-14 ENCOUNTER — Ambulatory Visit (INDEPENDENT_AMBULATORY_CARE_PROVIDER_SITE_OTHER): Payer: Medicare Other | Admitting: Internal Medicine

## 2017-04-14 VITALS — BP 122/88 | HR 70 | Ht 63.0 in | Wt 141.0 lb

## 2017-04-14 DIAGNOSIS — I482 Chronic atrial fibrillation: Secondary | ICD-10-CM

## 2017-04-14 DIAGNOSIS — Z5181 Encounter for therapeutic drug level monitoring: Secondary | ICD-10-CM | POA: Diagnosis not present

## 2017-04-14 DIAGNOSIS — I5030 Unspecified diastolic (congestive) heart failure: Secondary | ICD-10-CM

## 2017-04-14 DIAGNOSIS — I4891 Unspecified atrial fibrillation: Secondary | ICD-10-CM | POA: Diagnosis not present

## 2017-04-14 DIAGNOSIS — Z950 Presence of cardiac pacemaker: Secondary | ICD-10-CM

## 2017-04-14 DIAGNOSIS — Z953 Presence of xenogenic heart valve: Secondary | ICD-10-CM | POA: Diagnosis not present

## 2017-04-14 DIAGNOSIS — I4821 Permanent atrial fibrillation: Secondary | ICD-10-CM

## 2017-04-14 LAB — POCT INR: INR: 3.6

## 2017-04-14 MED ORDER — TORSEMIDE 20 MG PO TABS: 30.0000 mg | ORAL_TABLET | Freq: Every day | ORAL | Status: DC | PRN

## 2017-04-14 NOTE — Progress Notes (Signed)
Patient Care Team: Jodi Marble, MD as PCP - General (Internal Medicine) Minna Merritts, MD (Cardiology) Franchot Gallo, MD as Attending Physician (Urology) Frederik Pear, MD (Orthopedic Surgery) Deboraha Sprang, MD as Consulting Physician (Cardiology)   HPI  [name] is a 83 y.o. female Seen as an add on for spells of  dizzy/racing heart/neasuea spells  for about a week  The spells have been rather long-standing but has been much more problematic for the last 3 weeks. She has a dizziness that is present when she lies himself flat in bed at night but if she rotates on her side in is not present.  The spells of nausea typically occur following her morning medications. These include her lorazepam torsemide her Protonix. This morning she did not take her torsemide and she is not felt that nausea. With this nausea and there is an accompanying fluttering and "sinking feeling ". Her daughter has witnessed these and they have been not associated with pallor.    sshe has  permanent pacer implanted in the setting of permanent atrial fibrillation and bradycardia. She is also status post bioprosthetic aortic valve replacement. She has prior TIA with her INR target 2.5-3.0     Echocardiogram in February 2012 showed ejection fraction 50-55%, pulmonary pressures 46 mm of mercury; mild aortic stenosis with intact aortic valve, bioprosthetic valve, otherwise normal study.  5/16  Myoview normal LV function without evidence of ischemia 6/17  Echo EF normal with normal  functioning aortic bioprosthesis     Past Medical History:  Diagnosis Date  . AAA (abdominal aortic aneurysm) ([place])    a. 12/2011 U/S: 3.3 cm prox AAA.  [name] Anxiety   . Aortic insufficiency    a. S/P bioprosthetic AVR;  b. 02/2013 Echo: EF 50-55%, mild to mod AI (mean grad 54mmHg).  . Brain aneurysm    Hx of  . Chest pain    a. H/o nonobs cath;  b. 07/2013 Lexiscan MV: EF 66%, no ischemia.  . Chronic diastolic CHF  (congestive heart failure) ([place])    a. 02/2013 Echo: EF 50-55%.  . Diverticulosis   . Gastritis   . Hemorrhoids   . Hepatitis B 1980's  . Hiatal hernia   . Hypertension   . Moderate mitral regurgitation    a. 02/2013 Echo: Mod MR.  . Moderate tricuspid regurgitation    a. 02/2013 Echo: Mod TR.  [name] Permanent atrial fibrillation ([place])    a. Chronic coumadin.  . Polymyalgia rheumatica ([place])   . Pulmonary hypertension ([place])    a. 02/2013 Echo: PASP 49mmHg.  . S/P aortic valve replacement with bioprosthetic valve    a. [facility].  . S/P placement of cardiac pacemaker    a.  08/2007 s/p MDT [place] PPM.  . Seizures ([place])   . Shingles   . Skin cancer    face, ear  and left hand.  . Tachy-brady syndrome ([place])    a. Severe in the setting of permanent atrial fibrillation with tachybrady syndrome;  b. 08/2007 s/p MDT [place] PPM.  . Thoracic aneurysm    a. 10/2011 CTA: 5.0 x 3.8 cm.    Past Surgical History:  Procedure Laterality Date  . ABDOMINAL AORTIC ANEURYSM REPAIR     Resection  . AORTIC VALVE REPLACEMENT     With bioprosthetic valve  . APPENDECTOMY    . BREAST BIOPSY    . CEREBRAL ANEURYSM REPAIR    . CHOLECYSTECTOMY    .  EP IMPLANTABLE DEVICE N/A 08/20/2016   Procedure: PPM Generator Changeout;  Surgeon: Deboraha Sprang, MD;  Location: [facility];  Service: Cardiovascular;  Laterality: N/A;  . LUNG BIOPSY  2005  . PACEMAKER PLACEMENT  08/2007   Medtronic; AF permanent; s/p pacemaker for bradycardia with now well controlled ventricular response; Digoxin therapy, question level; Lower extremity venous issues  . skin cancer removal     ear, face and hand  . VESICOVAGINAL FISTULA CLOSURE W/ TAH      Current Outpatient Prescriptions  Medication Sig Dispense Refill  . acetaminophen (TYLENOL) 500 MG tablet Take 500 mg by mouth every 6 (six) hours as needed for mild pain.    [name] albuterol (PROVENTIL HFA;VENTOLIN HFA) 108 (90 Base) MCG/ACT inhaler  Inhale 1 puff into the lungs every 6 (six) hours as needed for wheezing or shortness of breath.    . Brinzolamide-Brimonidine (SIMBRINZA) 1-0.2 % SUSP Apply 1 drop to eye every morning.    . busPIRone (BUSPAR) 7.5 MG tablet Take 7.5 mg by mouth 2 (two) times daily.    . ciprofloxacin (CIPRO) 250 MG tablet Take 250 mg by mouth 2 (two) times daily.    . diclofenac sodium (VOLTAREN) 1 % GEL Apply 4 g topically 2 (two) times daily.    . flintstones complete (FLINTSTONES) 60 MG chewable tablet Chew 1 tablet by mouth daily.    [name] HYDROcodone-acetaminophen (NORCO/VICODIN) 5-325 MG tablet Take 1 tablet by mouth every 6 (six) hours as needed for moderate pain.    [name] latanoprost (XALATAN) 0.005 % ophthalmic solution Place 1 drop into both eyes at bedtime.    [name] LORazepam (ATIVAN) 0.5 MG tablet Take 1 tablet (0.5 mg total) by mouth 2 (two) times daily. (Patient taking differently: Take 0.5 mg by mouth 3 (three) times daily as needed for anxiety. )    . meclizine (ANTIVERT) 25 MG tablet Take 12.5 mg by mouth 3 (three) times daily as needed for dizziness.    . ondansetron (ZOFRAN-ODT) 4 MG disintegrating tablet Take 4 mg by mouth every 8 (eight) hours as needed for nausea or vomiting.    . pantoprazole (PROTONIX) 40 MG tablet Take 40 mg by mouth 2 (two) times daily.     . potassium chloride SA (K-DUR,KLOR-CON) 20 MEQ tablet TAKE ONE TABLET BY MOUTH EACH EVENING 90 tablet 3  . predniSONE (DELTASONE) 5 MG tablet Take 2.5 mg by mouth every other day. 2.5mg  every 3 days for Polymyalgia rheumatica.    [name] torsemide (DEMADEX) 20 MG tablet TAKE 1 & 1/2 TABLETS BY MOUTH ONCE DAILY 45 tablet 3  . traZODone (DESYREL) 50 MG tablet Take 50 mg by mouth at bedtime as needed for sleep.     [name] warfarin (COUMADIN) 5 MG tablet TAKE AS DIRECTED 30 tablet 3   No current facility-administered medications for this visit.     Allergies  Allergen Reactions  . Fluticasone-Salmeterol Anaphylaxis  . Furosemide Shortness Of Breath  .  Iodine Other (See Comments)    Reaction:  Fever and chills   . Iohexol Other (See Comments)    Desc:  Pt requires a 13 hr prep, when doing exam with contrast....fever, chills, dyspnea (slg/04/27/09--given 2 hour solumedrol/ benadryl prep--no reaction).    . Sulfonamide Derivatives Nausea And Vomiting  . Verapamil Nausea And Vomiting and Other (See Comments)    Reaction:  Loss of appetite   . Macrobid [Nitrofurantoin Monohyd Macro] Nausea Only and Other (See Comments)    Severe stomach pain  .  Latex Rash  . Penicillins Rash and Other (See Comments)    Pt unable to answer additional questions about this medication.   Has patient had a PCN reaction causing immediate rash, facial/tongue/throat swelling, SOB or lightheadedness with hypotension: Yes Has patient had a PCN reaction causing severe rash involving mucus membranes or skin necrosis: UnknownHas patient had a PCN reaction that required hospitalization NoHas patient had a PCN reaction occurring within the last 10 years: No If all of the above answers are "NO", then may proceed with Cephalosporin    Review of Systems negative except from HPI and PMH  Physical Exam BP 122/88 (BP Location: Left Arm, Patient Position: Sitting, Cuff Size: Normal)   Pulse 70   Ht 5\' 3"  (1.6 m)   Wt 141 lb (64 kg)   BMI 24.98 kg/m  Well developed and nourished in no acute distress HENT normal Neck supple with JVP-flat Clear Device pocket well healed; without hematoma or erythema.  There is no tethering  Regular rate and rhythm, 2/6 m Abd-soft with active BS No Clubbing cyanosis edema Skin-warm and dry A & Oriented  Grossly normal sensory and motor function  .  ECG demonstrateatrial fibrillation and ventricular pacing at 70   Assessment and  Plan  HFpEF  Complete heart block-device dependent  Atrial fibrillation-permanent The patient's device was interrogated.  The information was reviewed. No changes were made in the programming.     Pacemaker-Medtronic   The patient's device was interrogated.  The information was reviewed. No changes were made in the programming.     Hypertension  Spells.  Euvolemic continue current meds  I was surprised to find that her orthostatics were normal.  The paucity of her nausea symptoms with not having taken her torsemide this morning suggests a potential Link. She will start taking her torsemide as needed.  The flutter is an sinking feeling are hard to understand as she has complete heart block and has only 0.3% of her beats sensed. It is possible that these PVCs are related to her symptoms; hence, we will increase her lower rate from 60--75 E. permitted to attempt overdrive suppression. I have activated sleep mode.  In the event that she continues with these symptoms, we will use a 40 hour Holter monitor to see if there is a rhythmic correlation More than 50% of 45 min was spent in counseling related to the above

## 2017-04-14 NOTE — Patient Instructions (Signed)
Medication Instructions: - Your physician has recommended you make the following change in your medication:  1) take torsemide 20 mg- take 1 & 1/2 tablets (30 mg) by mouth once daily as needed for swelling/ shortness of breath  Labwork: - none ordered   Procedures/Testing: - none ordered  Follow-Up: - Remote monitoring is used to monitor your Pacemaker of ICD from home. This monitoring reduces the number of office visits required to check your device to one time per year. It allows [place] to keep an eye on the functioning of your device to ensure it is working properly. You are scheduled for a device check from home on 07/14/17. You may send your transmission at any time that day. If you have a wireless device, the transmission will be sent automatically. After your physician reviews your transmission, you will receive a postcard with your next transmission date.  - Your physician wants you to follow-up in: October 2018 with Dr. Caryl Comes. You will receive a reminder letter in the mail two months in advance. If you don't receive a letter, please call our office to schedule the follow-up appointment.   Any Additional Special Instructions Will Be Listed Below (If Applicable).     If you need a refill on your cardiac medications before your next appointment, please call your pharmacy.

## 2017-04-15 ENCOUNTER — Emergency Department (HOSPITAL_COMMUNITY): Payer: Medicare Other

## 2017-04-15 ENCOUNTER — Encounter (HOSPITAL_COMMUNITY): Payer: Self-pay | Admitting: Emergency Medicine

## 2017-04-15 ENCOUNTER — Inpatient Hospital Stay (HOSPITAL_COMMUNITY)
Admission: EM | Admit: 2017-04-15 | Discharge: 2017-04-23 | DRG: 177 | Disposition: A | Discharge status: Skilled Nursing Facility | Payer: Medicare Other | Attending: Family Medicine | Admitting: Family Medicine

## 2017-04-15 DIAGNOSIS — Z881 Allergy status to other antibiotic agents status: Secondary | ICD-10-CM

## 2017-04-15 DIAGNOSIS — D61818 Other pancytopenia: Secondary | ICD-10-CM | POA: Diagnosis present

## 2017-04-15 DIAGNOSIS — I081 Rheumatic disorders of both mitral and tricuspid valves: Secondary | ICD-10-CM | POA: Diagnosis present

## 2017-04-15 DIAGNOSIS — M25562 Pain in left knee: Secondary | ICD-10-CM | POA: Diagnosis not present

## 2017-04-15 DIAGNOSIS — J690 Pneumonitis due to inhalation of food and vomit: Principal | ICD-10-CM | POA: Diagnosis present

## 2017-04-15 DIAGNOSIS — I671 Cerebral aneurysm, nonruptured: Secondary | ICD-10-CM | POA: Diagnosis present

## 2017-04-15 DIAGNOSIS — B370 Candidal stomatitis: Secondary | ICD-10-CM | POA: Diagnosis not present

## 2017-04-15 DIAGNOSIS — J9601 Acute respiratory failure with hypoxia: Secondary | ICD-10-CM | POA: Diagnosis present

## 2017-04-15 DIAGNOSIS — R0603 Acute respiratory distress: Secondary | ICD-10-CM

## 2017-04-15 DIAGNOSIS — Z953 Presence of xenogenic heart valve: Secondary | ICD-10-CM

## 2017-04-15 DIAGNOSIS — M25462 Effusion, left knee: Secondary | ICD-10-CM | POA: Diagnosis present

## 2017-04-15 DIAGNOSIS — Z9889 Other specified postprocedural states: Secondary | ICD-10-CM

## 2017-04-15 DIAGNOSIS — R040 Epistaxis: Secondary | ICD-10-CM | POA: Diagnosis not present

## 2017-04-15 DIAGNOSIS — R739 Hyperglycemia, unspecified: Secondary | ICD-10-CM | POA: Diagnosis present

## 2017-04-15 DIAGNOSIS — R1310 Dysphagia, unspecified: Secondary | ICD-10-CM

## 2017-04-15 DIAGNOSIS — R042 Hemoptysis: Secondary | ICD-10-CM | POA: Diagnosis present

## 2017-04-15 DIAGNOSIS — I509 Heart failure, unspecified: Secondary | ICD-10-CM

## 2017-04-15 DIAGNOSIS — M1712 Unilateral primary osteoarthritis, left knee: Secondary | ICD-10-CM | POA: Diagnosis present

## 2017-04-15 DIAGNOSIS — Z85828 Personal history of other malignant neoplasm of skin: Secondary | ICD-10-CM

## 2017-04-15 DIAGNOSIS — I495 Sick sinus syndrome: Secondary | ICD-10-CM | POA: Diagnosis present

## 2017-04-15 DIAGNOSIS — N390 Urinary tract infection, site not specified: Secondary | ICD-10-CM | POA: Diagnosis present

## 2017-04-15 DIAGNOSIS — I272 Pulmonary hypertension, unspecified: Secondary | ICD-10-CM | POA: Diagnosis present

## 2017-04-15 DIAGNOSIS — I5032 Chronic diastolic (congestive) heart failure: Secondary | ICD-10-CM | POA: Diagnosis present

## 2017-04-15 DIAGNOSIS — I739 Peripheral vascular disease, unspecified: Secondary | ICD-10-CM | POA: Diagnosis present

## 2017-04-15 DIAGNOSIS — M25569 Pain in unspecified knee: Secondary | ICD-10-CM

## 2017-04-15 DIAGNOSIS — Z79899 Other long term (current) drug therapy: Secondary | ICD-10-CM

## 2017-04-15 DIAGNOSIS — J189 Pneumonia, unspecified organism: Secondary | ICD-10-CM | POA: Diagnosis not present

## 2017-04-15 DIAGNOSIS — I712 Thoracic aortic aneurysm, without rupture: Secondary | ICD-10-CM | POA: Diagnosis present

## 2017-04-15 DIAGNOSIS — I110 Hypertensive heart disease with heart failure: Secondary | ICD-10-CM | POA: Diagnosis present

## 2017-04-15 DIAGNOSIS — Z8249 Family history of ischemic heart disease and other diseases of the circulatory system: Secondary | ICD-10-CM

## 2017-04-15 DIAGNOSIS — R569 Unspecified convulsions: Secondary | ICD-10-CM | POA: Diagnosis present

## 2017-04-15 DIAGNOSIS — Z91040 Latex allergy status: Secondary | ICD-10-CM

## 2017-04-15 DIAGNOSIS — Z7901 Long term (current) use of anticoagulants: Secondary | ICD-10-CM

## 2017-04-15 DIAGNOSIS — I482 Chronic atrial fibrillation: Secondary | ICD-10-CM | POA: Diagnosis present

## 2017-04-15 DIAGNOSIS — D649 Anemia, unspecified: Secondary | ICD-10-CM | POA: Diagnosis not present

## 2017-04-15 DIAGNOSIS — Z66 Do not resuscitate: Secondary | ICD-10-CM | POA: Diagnosis present

## 2017-04-15 DIAGNOSIS — Z950 Presence of cardiac pacemaker: Secondary | ICD-10-CM

## 2017-04-15 DIAGNOSIS — I4891 Unspecified atrial fibrillation: Secondary | ICD-10-CM | POA: Diagnosis present

## 2017-04-15 DIAGNOSIS — M353 Polymyalgia rheumatica: Secondary | ICD-10-CM | POA: Diagnosis present

## 2017-04-15 DIAGNOSIS — M069 Rheumatoid arthritis, unspecified: Secondary | ICD-10-CM | POA: Diagnosis present

## 2017-04-15 DIAGNOSIS — R1314 Dysphagia, pharyngoesophageal phase: Secondary | ICD-10-CM | POA: Diagnosis present

## 2017-04-15 DIAGNOSIS — Z887 Allergy status to serum and vaccine status: Secondary | ICD-10-CM

## 2017-04-15 DIAGNOSIS — Z91041 Radiographic dye allergy status: Secondary | ICD-10-CM

## 2017-04-15 DIAGNOSIS — Z809 Family history of malignant neoplasm, unspecified: Secondary | ICD-10-CM

## 2017-04-15 DIAGNOSIS — F419 Anxiety disorder, unspecified: Secondary | ICD-10-CM | POA: Diagnosis present

## 2017-04-15 DIAGNOSIS — Z8679 Personal history of other diseases of the circulatory system: Secondary | ICD-10-CM

## 2017-04-15 DIAGNOSIS — Z888 Allergy status to other drugs, medicaments and biological substances status: Secondary | ICD-10-CM

## 2017-04-15 DIAGNOSIS — R0602 Shortness of breath: Secondary | ICD-10-CM | POA: Diagnosis present

## 2017-04-15 DIAGNOSIS — Z880 Allergy status to penicillin: Secondary | ICD-10-CM

## 2017-04-15 LAB — CBC
HEMATOCRIT: 42.2 % (ref 36.0–46.0)
HEMOGLOBIN: 13.6 g/dL (ref 12.0–15.0)
MCH: 29.9 pg (ref 26.0–34.0)
MCHC: 32.2 g/dL (ref 30.0–36.0)
MCV: 92.7 fL (ref 78.0–100.0)
Platelets: 121 10*3/uL — ABNORMAL LOW (ref 150–400)
RBC: 4.55 MIL/uL (ref 3.87–5.11)
RDW: 12.8 % (ref 11.5–15.5)
WBC: 2.7 10*3/uL — ABNORMAL LOW (ref 4.0–10.5)

## 2017-04-15 LAB — BASIC METABOLIC PANEL
ANION GAP: 9 (ref 5–15)
BUN: 23 mg/dL — AB (ref 6–20)
CHLORIDE: 99 mmol/L — AB (ref 101–111)
CO2: 27 mmol/L (ref 22–32)
Calcium: 9 mg/dL (ref 8.9–10.3)
Creatinine, Ser: 1.03 mg/dL — ABNORMAL HIGH (ref 0.44–1.00)
GFR calc Af Amer: 53 mL/min — ABNORMAL LOW (ref >60)
GFR calc non Af Amer: 46 mL/min — ABNORMAL LOW (ref >60)
GLUCOSE: 241 mg/dL — AB (ref 65–99)
POTASSIUM: 3.9 mmol/L (ref 3.5–5.1)
Sodium: 135 mmol/L (ref 135–145)

## 2017-04-15 LAB — CBC WITH DIFFERENTIAL/PLATELET
Basophils Absolute: 0 10*3/uL (ref 0.0–0.1)
Basophils Relative: 0 %
EOS PCT: 0 %
Eosinophils Absolute: 0 10*3/uL (ref 0.0–0.7)
HEMATOCRIT: 36.8 % (ref 36.0–46.0)
Hemoglobin: 11.9 g/dL — ABNORMAL LOW (ref 12.0–15.0)
LYMPHS ABS: 1.1 10*3/uL (ref 0.7–4.0)
LYMPHS PCT: 15 %
MCH: 30.1 pg (ref 26.0–34.0)
MCHC: 32.3 g/dL (ref 30.0–36.0)
MCV: 92.9 fL (ref 78.0–100.0)
MONO ABS: 0.3 10*3/uL (ref 0.1–1.0)
MONOS PCT: 4 %
NEUTROS ABS: 5.7 10*3/uL (ref 1.7–7.7)
Neutrophils Relative %: 81 %
Platelets: 157 10*3/uL (ref 150–400)
RBC: 3.96 MIL/uL (ref 3.87–5.11)
RDW: 12.9 % (ref 11.5–15.5)
WBC: 7 10*3/uL (ref 4.0–10.5)

## 2017-04-15 LAB — D-DIMER, QUANTITATIVE (NOT AT ARMC): D DIMER QUANT: 1.72 ug{FEU}/mL — AB (ref 0.00–0.50)

## 2017-04-15 LAB — URINALYSIS, ROUTINE W REFLEX MICROSCOPIC
BILIRUBIN URINE: NEGATIVE
GLUCOSE, UA: NEGATIVE mg/dL
Hgb urine dipstick: NEGATIVE
KETONES UR: NEGATIVE mg/dL
LEUKOCYTES UA: NEGATIVE
NITRITE: NEGATIVE
PH: 5 (ref 5.0–8.0)
Protein, ur: NEGATIVE mg/dL
Specific Gravity, Urine: >1.046 — ABNORMAL HIGH (ref 1.005–1.030)

## 2017-04-15 LAB — BRAIN NATRIURETIC PEPTIDE: B Natriuretic Peptide: 190.9 pg/mL — ABNORMAL HIGH (ref 0.0–100.0)

## 2017-04-15 LAB — PROTIME-INR
INR: 2.37
PROTHROMBIN TIME: 26.3 s — AB (ref 11.4–15.2)

## 2017-04-15 LAB — I-STAT TROPONIN, ED: TROPONIN I, POC: 0.02 ng/mL (ref 0.00–0.08)

## 2017-04-15 LAB — I-STAT CG4 LACTIC ACID, ED: LACTIC ACID, VENOUS: 1.49 mmol/L (ref 0.5–1.9)

## 2017-04-15 MED ORDER — SODIUM CHLORIDE 0.9 % IV BOLUS (SEPSIS)
1000.0000 mL | Freq: Once | INTRAVENOUS | Status: AC
  Administered 2017-04-15: 1000 mL via INTRAVENOUS

## 2017-04-15 MED ORDER — METRONIDAZOLE IN NACL 5-0.79 MG/ML-% IV SOLN
500.0000 mg | Freq: Three times a day (TID) | INTRAVENOUS | Status: DC
  Administered 2017-04-16 – 2017-04-17 (×4): 500 mg via INTRAVENOUS
  Filled 2017-04-15 (×4): qty 100

## 2017-04-15 MED ORDER — ONDANSETRON HCL 4 MG PO TABS: 4.0000 mg | ORAL_TABLET | Freq: Four times a day (QID) | ORAL | Status: DC | PRN

## 2017-04-15 MED ORDER — CEFTRIAXONE SODIUM 1 G IJ SOLR
1.0000 g | Freq: Once | INTRAMUSCULAR | Status: AC
  Administered 2017-04-15: 1 g via INTRAVENOUS
  Filled 2017-04-15: qty 10

## 2017-04-15 MED ORDER — DIPHENHYDRAMINE HCL 50 MG/ML IJ SOLN
50.0000 mg | Freq: Once | INTRAMUSCULAR | Status: AC
Start: 2017-04-15 — End: 2017-04-15
  Administered 2017-04-15: 50 mg via INTRAVENOUS
  Filled 2017-04-15: qty 1

## 2017-04-15 MED ORDER — LORAZEPAM 0.5 MG PO TABS
0.5000 mg | ORAL_TABLET | Freq: Three times a day (TID) | ORAL | Status: DC
  Administered 2017-04-16 – 2017-04-23 (×22): 0.5 mg via ORAL
  Filled 2017-04-15 (×22): qty 1

## 2017-04-15 MED ORDER — MECLIZINE HCL 25 MG PO TABS: 12.5000 mg | ORAL_TABLET | Freq: Three times a day (TID) | ORAL | Status: DC | PRN

## 2017-04-15 MED ORDER — LEVOFLOXACIN IN D5W 750 MG/150ML IV SOLN
750.0000 mg | INTRAVENOUS | Status: DC
  Administered 2017-04-16: 750 mg via INTRAVENOUS
  Filled 2017-04-15: qty 150

## 2017-04-15 MED ORDER — METRONIDAZOLE IN NACL 5-0.79 MG/ML-% IV SOLN
500.0000 mg | Freq: Once | INTRAVENOUS | Status: AC
  Administered 2017-04-16: 500 mg via INTRAVENOUS
  Filled 2017-04-15: qty 100

## 2017-04-15 MED ORDER — ONDANSETRON HCL 4 MG/2ML IJ SOLN: 4.0000 mg | Freq: Four times a day (QID) | INTRAMUSCULAR | Status: DC | PRN

## 2017-04-15 MED ORDER — DIPHENHYDRAMINE HCL 50 MG/ML IJ SOLN
25.0000 mg | Freq: Once | INTRAMUSCULAR | Status: AC
  Administered 2017-04-15: 25 mg via INTRAVENOUS
  Filled 2017-04-15: qty 1

## 2017-04-15 MED ORDER — BUSPIRONE HCL 15 MG PO TABS
7.5000 mg | ORAL_TABLET | Freq: Two times a day (BID) | ORAL | Status: DC
  Administered 2017-04-16 – 2017-04-23 (×16): 7.5 mg via ORAL
  Filled 2017-04-15 (×18): qty 1

## 2017-04-15 MED ORDER — TRAMADOL HCL 50 MG PO TABS
50.0000 mg | ORAL_TABLET | Freq: Four times a day (QID) | ORAL | Status: DC | PRN
  Administered 2017-04-16 – 2017-04-23 (×11): 50 mg via ORAL
  Filled 2017-04-15 (×11): qty 1

## 2017-04-15 MED ORDER — HYDROCODONE-ACETAMINOPHEN 5-325 MG PO TABS
1.0000 | ORAL_TABLET | Freq: Four times a day (QID) | ORAL | Status: DC | PRN
  Administered 2017-04-16 – 2017-04-21 (×7): 1 via ORAL
  Filled 2017-04-15 (×7): qty 1

## 2017-04-15 MED ORDER — LATANOPROST 0.005 % OP SOLN
1.0000 [drp] | Freq: Every day | OPHTHALMIC | Status: DC
  Administered 2017-04-16 – 2017-04-22 (×8): 1 [drp] via OPHTHALMIC
  Filled 2017-04-15: qty 2.5

## 2017-04-15 MED ORDER — DICLOFENAC SODIUM 1 % TD GEL
4.0000 g | Freq: Two times a day (BID) | TRANSDERMAL | Status: DC
  Administered 2017-04-16 – 2017-04-23 (×15): 4 g via TOPICAL
  Filled 2017-04-15: qty 100

## 2017-04-15 MED ORDER — ACETAMINOPHEN 650 MG RE SUPP: 650.0000 mg | Freq: Four times a day (QID) | RECTAL | Status: DC | PRN

## 2017-04-15 MED ORDER — IOPAMIDOL (ISOVUE-370) INJECTION 76%
INTRAVENOUS | Status: AC
  Administered 2017-04-15: 100 mL
  Filled 2017-04-15: qty 100

## 2017-04-15 MED ORDER — HYDROCORTISONE NA SUCCINATE PF 100 MG IJ SOLR
200.0000 mg | Freq: Once | INTRAMUSCULAR | Status: AC
  Administered 2017-04-15: 200 mg via INTRAVENOUS
  Filled 2017-04-15: qty 4

## 2017-04-15 MED ORDER — TRAZODONE HCL 50 MG PO TABS
50.0000 mg | ORAL_TABLET | Freq: Every day | ORAL | Status: DC
  Administered 2017-04-16 – 2017-04-22 (×8): 50 mg via ORAL
  Filled 2017-04-15 (×8): qty 1

## 2017-04-15 MED ORDER — PANTOPRAZOLE SODIUM 40 MG PO TBEC
40.0000 mg | DELAYED_RELEASE_TABLET | Freq: Every day | ORAL | Status: DC
  Administered 2017-04-16 – 2017-04-23 (×8): 40 mg via ORAL
  Filled 2017-04-15 (×8): qty 1

## 2017-04-15 MED ORDER — ACETAMINOPHEN 325 MG PO TABS: 650.0000 mg | ORAL_TABLET | Freq: Four times a day (QID) | ORAL | Status: DC | PRN

## 2017-04-15 NOTE — Progress Notes (Signed)
PHARMACY NOTE:  ANTIMICROBIAL RENAL DOSAGE ADJUSTMENT  Current antimicrobial regimen includes a mismatch between antimicrobial dosage and estimated renal function.  As per policy approved by the Pharmacy & Therapeutics and Medical Executive Committees, the antimicrobial dosage will be adjusted accordingly.  Current antimicrobial dosage:  Levaquin 750mg  IV q24h  Indication: CAP  Renal Function:  Estimated Creatinine Clearance: 31.4 mL/min (A) (by C-G formula based on SCr of 1.03 mg/dL (H)).    Antimicrobial dosage has been changed to:  Levaquin 750mg  IV q48h  Thank you for allowing pharmacy to be a part of this patient's care.  Sherlon Handing, PharmD, BCPS Clinical pharmacist, pager [phone] 04/15/2017 11:29 PM

## 2017-04-15 NOTE — ED Triage Notes (Addendum)
Pt here via [facility].  While she was having CT, she felt as if "something was coming up throat".  Given 5 nitro for bp 210/90), SPos 88% w rr of 26, placed on c-pap.    Pt did not take Torsemide x 2 days b/c it makes her dizzy.   Coughing up frank blood.  Pacemaker rate recently changed to 70, pt hr in 80 bbb, presently, per [facility].

## 2017-04-15 NOTE — ED Notes (Signed)
Admitting at bedside 

## 2017-04-15 NOTE — Progress Notes (Signed)
Pt placed on BiPAP, 10/5, 30%.  Pt tolerating well.  Will continue to monitor.

## 2017-04-15 NOTE — H&P (Signed)
History and Physical    [name] WNI:[id number] DOB: 1922/09/28 DOA: 04/15/2017  PCP: Jodi Marble, MD  Patient coming from: Home.  Chief Complaint: Shortness of breath and hemoptysis.  HPI: [name] is a 83 y.o. female with history of tachybradycardia syndrome status post pacemaker placement, permanent atrial fibrillation, bioprosthetic aortic valve replacement, abdominal aortic aneurysm status post repair, diastolic CHF was brought to the ER after patient was having shortness of breath with hemoptysis. Patient had gone yesterday for having CAT scan of the abdomen and pelvis since patient was having mid back pain radiating to the front for last few weeks. After the CAT scan was done patient started coughing up with shortness of breath. Patient was having hemoptysis. After reaching home patient became short of breath and EMS was called and was brought to the hospital.   ED Course: Initially patient was placed on BiPAP. CT is available the chest shows multifocal pneumonia with possible aspiration and also necrosis. Patient was started on empiric antibiotics. BiPAP was weaned off eventually. Denies any chest pain at this time.  Review of Systems: As per HPI, rest all negative.   Past Medical History:  Diagnosis Date  . AAA (abdominal aortic aneurysm) ([place])    a. 12/2011 U/S: 3.3 cm prox AAA.  [name] Anxiety   . Aortic insufficiency    a. S/P bioprosthetic AVR;  b. 02/2013 Echo: EF 50-55%, mild to mod AI (mean grad 57mmHg).  . Brain aneurysm    Hx of  . Chest pain    a. H/o nonobs cath;  b. 07/2013 Lexiscan MV: EF 66%, no ischemia.  . Chronic diastolic CHF (congestive heart failure) ([place])    a. 02/2013 Echo: EF 50-55%.  . Diverticulosis   . Gastritis   . Hemorrhoids   . Hepatitis B 1980's  . Hiatal hernia   . Hypertension   . Moderate mitral regurgitation    a. 02/2013 Echo: Mod MR.  . Moderate tricuspid regurgitation    a. 02/2013 Echo: Mod TR.  [name] Permanent atrial  fibrillation ([place])    a. Chronic coumadin.  . Polymyalgia rheumatica ([place])   . Pulmonary hypertension ([place])    a. 02/2013 Echo: PASP 62mmHg.  . S/P aortic valve replacement with bioprosthetic valve    a. [facility].  . S/P placement of cardiac pacemaker    a.  08/2007 s/p MDT [place] PPM.  . Seizures ([place])   . Shingles   . Skin cancer    face, ear  and left hand.  . Tachy-brady syndrome ([place])    a. Severe in the setting of permanent atrial fibrillation with tachybrady syndrome;  b. 08/2007 s/p MDT [place] PPM.  . Thoracic aneurysm    a. 10/2011 CTA: 5.0 x 3.8 cm.    Past Surgical History:  Procedure Laterality Date  . ABDOMINAL AORTIC ANEURYSM REPAIR     Resection  . AORTIC VALVE REPLACEMENT     With bioprosthetic valve  . APPENDECTOMY    . BREAST BIOPSY    . CEREBRAL ANEURYSM REPAIR    . CHOLECYSTECTOMY    . EP IMPLANTABLE DEVICE N/A 08/20/2016   Procedure: PPM Generator Changeout;  Surgeon: Deboraha Sprang, MD;  Location: [facility];  Service: Cardiovascular;  Laterality: N/A;  . LUNG BIOPSY  2005  . PACEMAKER PLACEMENT  08/2007   [employer]; AF permanent; s/p pacemaker for bradycardia with now well controlled ventricular response; Digoxin therapy, question level; Lower extremity  venous issues  . skin cancer removal     ear, face and hand  . VESICOVAGINAL FISTULA CLOSURE W/ TAH       reports that she has never smoked. She has never used smokeless tobacco. She reports that she does not drink alcohol or use drugs.  Allergies  Allergen Reactions  . Fluticasone-Salmeterol Anaphylaxis  . Furosemide Shortness Of Breath  . Iodine Other (See Comments)    Reaction:  Fever and chills   . Iohexol Other (See Comments)    Desc:  Pt requires a 13 hr prep, when doing exam with contrast....fever, chills, dyspnea (slg/04/27/09--given 2 hour solumedrol/ benadryl prep--no reaction).    . Sulfonamide Derivatives Nausea And Vomiting  . Verapamil Nausea And  Vomiting and Other (See Comments)    Reaction:  Loss of appetite   . Macrobid [Nitrofurantoin Monohyd Macro] Nausea Only and Other (See Comments)    Severe stomach pain  . Latex Rash  . Penicillins Rash and Other (See Comments)    Pt unable to answer additional questions about this medication.   Has patient had a PCN reaction causing immediate rash, facial/tongue/throat swelling, SOB or lightheadedness with hypotension: Yes Has patient had a PCN reaction causing severe rash involving mucus membranes or skin necrosis: UnknownHas patient had a PCN reaction that required hospitalization NoHas patient had a PCN reaction occurring within the last 10 years: No If all of the above answers are "NO", then may proceed with Cephalosporin    Family History  Problem Relation Age of Onset  . Cancer Mother   . Heart disease Mother   . Hypertension Mother   . Varicose Veins Mother   . Heart disease Father   . Hypertension Father   . Heart disease Sister   . Hypertension Sister   . Varicose Veins Sister   . AAA (abdominal aortic aneurysm) Sister   . Heart disease Brother   . Hypertension Brother     Prior to Admission medications   Medication Sig Start Date End Date Taking? Authorizing Provider  acetaminophen (TYLENOL) 500 MG tablet Take 500 mg by mouth every 6 (six) hours as needed for mild pain.   Yes [provider]  albuterol (PROVENTIL HFA;VENTOLIN HFA) 108 (90 Base) MCG/ACT inhaler Inhale 1 puff into the lungs every 6 (six) hours as needed for wheezing or shortness of breath.   Yes [provider]  Brinzolamide-Brimonidine (SIMBRINZA) 1-0.2 % SUSP Place 1 drop into both eyes daily.    Yes [provider]  busPIRone (BUSPAR) 7.5 MG tablet Take 7.5 mg by mouth 2 (two) times daily.   Yes [provider]  ciprofloxacin (CIPRO) 250 MG tablet Take 250 mg by mouth 2 (two) times daily.   Yes [provider]  diclofenac sodium (VOLTAREN) 1 % GEL Apply 4 g  topically 2 (two) times daily.   Yes [provider]  flintstones complete (FLINTSTONES) 60 MG chewable tablet Chew 1 tablet by mouth daily.   Yes [provider]  HYDROcodone-acetaminophen (NORCO/VICODIN) 5-325 MG tablet Take 1 tablet by mouth every 6 (six) hours as needed for moderate pain.   Yes [provider]  latanoprost (XALATAN) 0.005 % ophthalmic solution Place 1 drop into both eyes at bedtime.   Yes [provider]  LORazepam (ATIVAN) 0.5 MG tablet Take 1 tablet (0.5 mg total) by mouth 2 (two) times daily. Patient taking differently: Take 0.5 mg by mouth 3 (three) times daily.  05/14/16  Yes Barton Dubois, MD  meclizine (  ANTIVERT) 25 MG tablet Take 12.5 mg by mouth 3 (three) times daily as needed for dizziness.   Yes [provider]  pantoprazole (PROTONIX) 40 MG tablet Take 40 mg by mouth daily.    Yes [provider]  potassium chloride SA (K-DUR,KLOR-CON) 20 MEQ tablet TAKE ONE TABLET BY MOUTH EACH EVENING 12/16/16  Yes Gollan, Kathlene November, MD  torsemide (DEMADEX) 20 MG tablet Take 1.5 tablets (30 mg total) by mouth daily as needed. Patient taking differently: Take 20 mg by mouth daily as needed (for swelling).  04/14/17  Yes Deboraha Sprang, MD  traMADol (ULTRAM) 50 MG tablet Take 50 mg by mouth every 6 (six) hours as needed for moderate pain.   Yes [provider]  traZODone (DESYREL) 50 MG tablet Take 50 mg by mouth at bedtime.    Yes [provider]  warfarin (COUMADIN) 5 MG tablet Take 2.5-5 mg by mouth See admin instructions. Take 1/2 tablet on Friday then take 1 tablet all other days   Yes [provider]  warfarin (COUMADIN) 5 MG tablet TAKE AS DIRECTED Patient not taking: Reported on 04/15/2017 01/02/17   Deboraha Sprang, MD    Physical Exam: Vitals:   04/15/17 2100 04/15/17 2130 04/15/17 2200 04/15/17 2230  BP: (!) 181/69 (!) 160/90 (!) 163/62 (!) 166/64  Pulse: 74 78 75 74  Resp: 20 (!) 25 (!) 23  (!) 21  Temp:      TempSrc:      SpO2: 95% 94% 93% 92%      Constitutional: Moderately built and nourished. Vitals:   04/15/17 2100 04/15/17 2130 04/15/17 2200 04/15/17 2230  BP: (!) 181/69 (!) 160/90 (!) 163/62 (!) 166/64  Pulse: 74 78 75 74  Resp: 20 (!) 25 (!) 23 (!) 21  Temp:      TempSrc:      SpO2: 95% 94% 93% 92%   Eyes: Anicteric no pallor. ENMT: No discharge from the ears eyes nose and mouth. Neck: No mass felt. No neck rigidity. Respiratory: No rhonchi or crepitations. Cardiovascular: S1-S2 heard no murmurs appreciated. Abdomen: Soft nontender bowel sounds present. Musculoskeletal: No edema. No joint effusion. Skin: No rash. Multiple ecchymotic areas. Neurologic: Alert oriented to time place and person. Moves all esterase. Psychiatric: Appears normal. Normal affect.   Labs on Admission: I have personally reviewed following labs and imaging studies  CBC:  Recent Labs Lab 04/15/17 1410 04/15/17 2214  WBC 2.7* 7.0  NEUTROABS  --  5.7  HGB 13.6 11.9*  HCT 42.2 36.8  MCV 92.7 92.9  PLT 121* 409   Basic Metabolic Panel:  Recent Labs Lab 04/15/17 1410  NA 135  K 3.9  CL 99*  CO2 27  GLUCOSE 241*  BUN 23*  CREATININE 1.03*  CALCIUM 9.0   GFR: Estimated Creatinine Clearance: 31.4 mL/min (A) (by C-G formula based on SCr of 1.03 mg/dL (H)). Liver Function Tests: No results for input(s): AST, ALT, ALKPHOS, BILITOT, PROT, ALBUMIN in the last 168 hours. No results for input(s): LIPASE, AMYLASE in the last 168 hours. No results for input(s): AMMONIA in the last 168 hours. Coagulation Profile:  Recent Labs Lab 04/14/17 1022 04/15/17 1410  INR 3.6 2.37   Cardiac Enzymes: No results for input(s): CKTOTAL, CKMB, CKMBINDEX, TROPONINI in the last 168 hours. BNP (last 3 results) No results for input(s): PROBNP in the last 8760 hours. HbA1C: No results for input(s): HGBA1C in the last 72 hours. CBG: No results for input(s): GLUCAP in  the last 168  hours. Lipid Profile: No results for input(s): CHOL, HDL, LDLCALC, TRIG, CHOLHDL, LDLDIRECT in the last 72 hours. Thyroid Function Tests: No results for input(s): TSH, T4TOTAL, FREET4, T3FREE, THYROIDAB in the last 72 hours. Anemia Panel: No results for input(s): VITAMINB12, FOLATE, FERRITIN, TIBC, IRON, RETICCTPCT in the last 72 hours. Urine analysis:    Component Value Date/Time   COLORURINE YELLOW 04/15/2017 [place] 04/15/2017 1539   LABSPEC >1.046 (H) 04/15/2017 1539   PHURINE 5.0 04/15/2017 1539   GLUCOSEU NEGATIVE 04/15/2017 1539   HGBUR NEGATIVE 04/15/2017 1539   BILIRUBINUR NEGATIVE 04/15/2017 1539   KETONESUR NEGATIVE 04/15/2017 1539   PROTEINUR NEGATIVE 04/15/2017 1539   UROBILINOGEN 1.0 08/01/2015 2030   NITRITE NEGATIVE 04/15/2017 1539   LEUKOCYTESUR NEGATIVE 04/15/2017 1539   Sepsis Labs: @LABRCNTIP (procalcitonin:4,lacticidven:4) )No results found for this or any previous visit (from the past 240 hour(s)).   Radiological Exams on Admission: Ct Angio Chest Pe W/cm &/or Wo Cm  Result Date: 04/15/2017 CLINICAL DATA:  Hemoptysis, respiratory distress and hypoxia. EXAM: CT ANGIOGRAPHY CHEST WITH CONTRAST TECHNIQUE: Multidetector CT imaging of the chest was performed using the standard protocol during bolus administration of intravenous contrast. Multiplanar CT image reconstructions and MIPs were obtained to evaluate the vascular anatomy. CONTRAST:  100 mL Isovue 370 COMPARISON:  Chest radiograph 04/15/2017.  Chest CT 06/07/2016 FINDINGS: Cardiovascular: No evidence for a large pulmonary emboli. Evaluation of the right lower lobe pulmonary arteries is very limited due to extensive parenchymal disease in this area. Difficult to exclude small segmental emboli in this area. Again noted is aneurysm of the distal aortic arch and proximal descending thoracic aorta. The aneurysm now measures up to 5.6 cm, previously measured 5.3 cm. There is a prosthetic aortic valve.  Atherosclerotic disease throughout the thoracic aorta. The mid descending thoracic aorta measures 4.9 cm and stable. Distal descending thoracic aorta measures 4.3 cm and stable. Pacer lead in the right ventricle. Mediastinum/Nodes: No significant chest lymphadenopathy. No large pericardial effusion. Lungs/Pleura: The trachea is patent. There is material within the bronchus intermedius and within the right lower lobe airways. There is also material within left lower lobe airways. Findings concerning for aspiration. There is extensive airspace disease throughout the right lower lobe and posterior right upper lobe. There is some airspace disease in the right middle lobe. Scattered areas of airspace disease in the left lower lobe. In addition, there is extensive pleural-based consolidation throughout the posteromedial aspect of the right lower lobe. This is not compatible with pleural fluid. There may be small areas of lung necrosis within this pleural-based consolidation. No large pleural effusions. Upper Abdomen: Multiple hepatic cysts. Contrast refluxing into the hepatic veins. There may be left adrenal gland thickening which is similar to the previous examination. No acute abnormality in the upper abdomen. Musculoskeletal: No acute bone abnormality. Review of the MIP images confirms the above findings. IMPRESSION: Extensive airspace disease throughout both lungs, most prominent in the right lower lobe. Findings are most compatible with multifocal pneumonia. Lobulated pleural-based consolidation along the posteromedial right lung with small areas of lung necrosis. Multiple endobronchial filling defects. Findings are suggestive for aspiration. Negative for large pulmonary emboli. Evaluation of the right lower lobar pulmonary arteries is limited due to extensive airspace disease in this area. Mild enlargement of the large aneurysm involving the posterior aortic arch and proximal descending thoracic aorta. The thoracic  aortic aneurysm now measures up to 5.6 cm. No evidence for an acute rupture. These results will  be called to the ordering clinician or representative by the Radiologist Assistant, and communication documented in the PACS or zVision Dashboard. Electronically Signed   By: Markus Daft M.D.   On: 04/15/2017 20:54   Dg Chest Port 1 View  Result Date: 04/15/2017 CLINICAL DATA:  Dyspnea, hemoptysis EXAM: PORTABLE CHEST 1 VIEW COMPARISON:  06/07/2016 chest radiograph. FINDINGS: Intact sternotomy wires. Stable aortic valve prosthesis. Single lead left subclavian pacemaker is stable in configuration. Stable mild cardiomegaly. Aortic atherosclerosis. Prominence of the aortic arch contour, which appears mildly increased in the interval. Otherwise stable and normal mediastinal contour. No pneumothorax. No pleural effusion. Surgical sutures overlie the medial right upper lung. No overt pulmonary edema. Mild bibasilar scarring versus atelectasis. No acute consolidative airspace disease. Stable mild peripheral basilar right pleural thickening. IMPRESSION: 1. Stable mild cardiomegaly without overt pulmonary edema . 2. Aortic atherosclerosis. Prominence of the aortic arch contour, which appears increased in the interval, cannot exclude growth of the known thoracic aortic aneurysm. Suggest correlation with chest CT angiogram with IV contrast. 3. Mild bibasilar scarring versus atelectasis. Electronically Signed   By: Ilona Sorrel M.D.   On: 04/15/2017 14:30    EKG: Independently reviewed. Sinus rhythm with IVCD.  Assessment/Plan Principal Problem:   Acute respiratory failure (HCC) Active Problems:   ATRIAL FIBRILLATION   PVD   Pulmonary hypertension (HCC)   Pneumonia, organism unspecified([place])   S/P AAA repair   Tachy-brady syndrome (HCC)   CHF (congestive heart failure) (HCC)   Rheumatoid arthritis (HCC)   Acute respiratory failure with hypoxia ([place])    1. Acute respiratory failure with hypoxia secondary to  multifocal pneumonia - likely secondary to aspiration. Patient is placed on Levaquin and Flagyl. Check urine for Legionella and strep antigen and sputum cultures. 2. Hemoptysis - will hold Coumadin for now and closely observe CBC. If hemoptysis persists may have to discontinue Coumadin and reverse INR. 3. Permanent A. Fib - presently rate controlled. Chads 2 vasc score is more than 2 patient is on Coumadin which is on hold. 4. Hypertension - will keep patient on when necessary IV hydralazine for now and closely follow blood pressure trends. 5. Diastolic CHF and pulmonary hypertension - for now holding onto medics. 6. History of abdominal aortic aneurysm status post repair and recent CT scan showing thoracic artery aneurysm - follow-up as outpatient. 7. Tachybradycardia syndrome status post pacemaker placement. 8. Peripheral vascular disease.   DVT prophylaxis: SCDs and Coumadin. Code Status: DO NOT RESUSCITATE.  Family Communication: Patient's niece.  Disposition Plan: Home.  Consults called: None.  Admission status: Inpatient.    Rise Patience MD [facility] Pager [phone].  If 7PM-7AM, please contact night-coverage [email or web address] Password [email or web address]  04/15/2017, 11:26 PM

## 2017-04-15 NOTE — ED Provider Notes (Addendum)
[facility] DEPT Provider Note   CSN: [id number] Arrival date & time: 04/15/17  1345     History   Chief Complaint Chief Complaint  Patient presents with  . Respiratory Distress    HPI [name] is a 83 y.o. female.  83yo F w/ PMH incluidng dCHF, aortic valve repair, A fib on coumadin, seizures, polymyalgia rheumatica, pulmonary hypertension who presents with respiratory distress. Earlier today the patient had a CT of the abdomen with oral and IV contrast. When she got home, she began having shortness of breath and had several episodes of coughing up blood. She called EMS and when they arrived she was in respiratory distress with hypoxia to 88% on room air, hypertension at 210/90s, and audible rales. She was placed on CPAP and given NTG x5. He states that her breathing has improved since these interventions. She denies any associated chest pain. She was started on ciprofloxacin yesterday for a UTI and was also stopped on her torsemide because of concern for side effect of dizziness. She denies any fevers, vomiting, or change in bowel movements.  LEVEL 5 CAVEAT DUE TO RESPIRATORY DISTRESS   The history is provided by the patient and the EMS personnel.    Past Medical History:  Diagnosis Date  . AAA (abdominal aortic aneurysm) ([place])    a. 12/2011 U/S: 3.3 cm prox AAA.  [name] Anxiety   . Aortic insufficiency    a. S/P bioprosthetic AVR;  b. 02/2013 Echo: EF 50-55%, mild to mod AI (mean grad 88mmHg).  . Brain aneurysm    Hx of  . Chest pain    a. H/o nonobs cath;  b. 07/2013 Lexiscan MV: EF 66%, no ischemia.  . Chronic diastolic CHF (congestive heart failure) ([place])    a. 02/2013 Echo: EF 50-55%.  . Diverticulosis   . Gastritis   . Hemorrhoids   . Hepatitis B 1980's  . Hiatal hernia   . Hypertension   . Moderate mitral regurgitation    a. 02/2013 Echo: Mod MR.  . Moderate tricuspid regurgitation    a. 02/2013 Echo: Mod TR.  [name] Permanent atrial fibrillation ([place])    a.  Chronic coumadin.  . Polymyalgia rheumatica ([place])   . Pulmonary hypertension ([place])    a. 02/2013 Echo: PASP 75mmHg.  . S/P aortic valve replacement with bioprosthetic valve    a. [name].  . S/P placement of cardiac pacemaker    a.  08/2007 s/p MDT [place] PPM.  . Seizures ([place])   . Shingles   . Skin cancer    face, ear  and left hand.  . Tachy-brady syndrome ([place])    a. Severe in the setting of permanent atrial fibrillation with tachybrady syndrome;  b. 08/2007 s/p MDT [place] PPM.  . Thoracic aneurysm    a. 10/2011 CTA: 5.0 x 3.8 cm.    Patient Active Problem List   Diagnosis Date Noted  . Thoracic aortic aneurysm without rupture ([place]) 11/21/2016  . Complete heart block ([place]) 08/20/2016  . CHF (congestive heart failure) ([place]) 05/10/2016  . Acute respiratory failure ([place]) 05/10/2016  . Rheumatoid arthritis ([place]) 05/10/2016  . Insomnia 05/10/2016  . Nausea 03/31/2016  . Pain in the muscles 08/31/2015  . S/P AAA repair 03/27/2015  . Tachy-brady syndrome ([place]) 03/27/2015  . Constipation 03/27/2015  . Dizziness 11/01/2014  . Pneumonia, organism unspecified(486) 11/01/2014  . Fatigue 02/27/2014  . Pulmonary hypertension ([place]) 01/26/2014  . Encounter for therapeutic drug monitoring 12/15/2013  .  Bronchitis 12/15/2013  . Frequent PVCs 12/15/2013  . Exertional angina ([place]) 07/07/2013  . Bradycardia 07/07/2013  . Ulcer of other part of lower limb 12/28/2012  . Hyperlipidemia 12/26/2010  . TIA 12/26/2010  . PACEMAKER-Medtronic 05/07/2010  . S/P aortic valve replacement with bioprosthetic valve 01/15/2010  . MURMUR 01/15/2010  . DIASTOLIC HEART FAILURE, CHRONIC 10/26/2009  . PVD 07/17/2009  . ATRIAL FIBRILLATION 06/19/2009  . DIZZINESS AND GIDDINESS 06/19/2009    Past Surgical History:  Procedure Laterality Date  . ABDOMINAL AORTIC ANEURYSM REPAIR     Resection  . AORTIC VALVE REPLACEMENT     With bioprosthetic valve  . APPENDECTOMY    .  BREAST BIOPSY    . CEREBRAL ANEURYSM REPAIR    . CHOLECYSTECTOMY    . EP IMPLANTABLE DEVICE N/A 08/20/2016   Procedure: PPM Generator Changeout;  Surgeon: Deboraha Sprang, MD;  Location: [facility];  Service: Cardiovascular;  Laterality: N/A;  . LUNG BIOPSY  2005  . PACEMAKER PLACEMENT  08/2007   Medtronic; AF permanent; s/p pacemaker for bradycardia with now well controlled ventricular response; Digoxin therapy, question level; Lower extremity venous issues  . skin cancer removal     ear, face and hand  . VESICOVAGINAL FISTULA CLOSURE W/ TAH      OB History    Gravida Para Term Preterm AB Living   0 0 0 0 0 0   SAB TAB Ectopic Multiple Live Births   0 0 0 0         Home Medications    Prior to Admission medications   Medication Sig Start Date End Date Taking? Authorizing Provider  acetaminophen (TYLENOL) 500 MG tablet Take 500 mg by mouth every 6 (six) hours as needed for mild pain.    [provider]  albuterol (PROVENTIL HFA;VENTOLIN HFA) 108 (90 Base) MCG/ACT inhaler Inhale 1 puff into the lungs every 6 (six) hours as needed for wheezing or shortness of breath.    [provider]  Brinzolamide-Brimonidine (SIMBRINZA) 1-0.2 % SUSP Apply 1 drop to eye every morning.    [provider]  busPIRone (BUSPAR) 7.5 MG tablet Take 7.5 mg by mouth 2 (two) times daily.    [provider]  ciprofloxacin (CIPRO) 250 MG tablet Take 250 mg by mouth 2 (two) times daily.    [provider]  diclofenac sodium (VOLTAREN) 1 % GEL Apply 4 g topically 2 (two) times daily.    [provider]  flintstones complete (FLINTSTONES) 60 MG chewable tablet Chew 1 tablet by mouth daily.    [provider]  HYDROcodone-acetaminophen (NORCO/VICODIN) 5-325 MG tablet Take 1 tablet by mouth every 6 (six) hours as needed for moderate pain.    [provider]  latanoprost (XALATAN) 0.005 % ophthalmic solution Place 1 drop into both eyes at  bedtime.    [provider]  LORazepam (ATIVAN) 0.5 MG tablet Take 1 tablet (0.5 mg total) by mouth 2 (two) times daily. Patient taking differently: Take 0.5 mg by mouth 3 (three) times daily as needed for anxiety.  05/14/16   Barton Dubois, MD  meclizine (ANTIVERT) 25 MG tablet Take 12.5 mg by mouth 3 (three) times daily as needed for dizziness.    [provider]  ondansetron (ZOFRAN-ODT) 4 MG disintegrating tablet Take 4 mg by mouth every 8 (eight) hours as needed for nausea or vomiting.    [provider]  pantoprazole (PROTONIX) 40 MG tablet Take 40 mg by mouth 2 (  two) times daily.     [provider]  potassium chloride SA (K-DUR,KLOR-CON) 20 MEQ tablet TAKE ONE TABLET BY MOUTH EACH EVENING 12/16/16   Minna Merritts, MD  predniSONE (DELTASONE) 5 MG tablet Take 2.5 mg by mouth every other day. 2.5mg  every 3 days for Polymyalgia rheumatica.    [provider]  torsemide (DEMADEX) 20 MG tablet Take 1.5 tablets (30 mg total) by mouth daily as needed. 04/14/17   Deboraha Sprang, MD  traZODone (DESYREL) 50 MG tablet Take 50 mg by mouth at bedtime as needed for sleep.     [provider]  warfarin (COUMADIN) 5 MG tablet TAKE AS DIRECTED 01/02/17   Deboraha Sprang, MD    Family History Family History  Problem Relation Age of Onset  . Cancer Mother   . Heart disease Mother   . Hypertension Mother   . Varicose Veins Mother   . Heart disease Father   . Hypertension Father   . Heart disease Sister   . Hypertension Sister   . Varicose Veins Sister   . AAA (abdominal aortic aneurysm) Sister   . Heart disease Brother   . Hypertension Brother     Social History Social History  Substance Use Topics  . Smoking status: Never Smoker  . Smokeless tobacco: Never Used  . Alcohol use No     Allergies   Fluticasone-salmeterol; Furosemide; Iodine; Iohexol; Sulfonamide derivatives; Verapamil; Macrobid [nitrofurantoin monohyd macro]; Latex; and  Penicillins   Review of Systems Review of Systems  Unable to perform ROS: Severe respiratory distress     Physical Exam Updated Vital Signs BP (!) 161/66   Pulse 94   Temp 98.4 F (36.9 C) (Oral)   Resp (!) 22   SpO2 93%   Physical Exam  Constitutional: She is oriented to person, place, and time. She appears distressed.  Thin, frail elderly woman sitting up with CPAP on, in mild respiratory distress  HENT:  Head: Normocephalic and atraumatic.  Eyes: Conjunctivae are normal. Pupils are equal, round, and reactive to light.  Neck: Neck supple.  Cardiovascular: Normal rate, regular rhythm and normal heart sounds.   No murmur heard. Pulmonary/Chest:  Tachypnea with increased WOB, no obvious crackles, upper airway congestion  Abdominal: Soft. Bowel sounds are normal. She exhibits no distension. There is no tenderness.  Musculoskeletal: She exhibits no edema.  Neurological: She is alert and oriented to person, place, and time.  Fluent speech  Skin: Skin is warm and dry.  Psychiatric: She has a normal mood and affect. Judgment normal.  Nursing note and vitals reviewed.    ED Treatments / Results  Labs (all labs ordered are listed, but only abnormal results are displayed) Labs Reviewed  BASIC METABOLIC PANEL - Abnormal; Notable for the following:       Result Value   Chloride 99 (*)    Glucose, Bld 241 (*)    BUN 23 (*)    Creatinine, Ser 1.03 (*)    GFR calc non Af Amer 46 (*)    GFR calc Af Amer 53 (*)    All other components within normal limits  CBC - Abnormal; Notable for the following:    WBC 2.7 (*)    Platelets 121 (*)    All other components within normal limits  BRAIN NATRIURETIC PEPTIDE - Abnormal; Notable for the following:    B Natriuretic Peptide 190.9 (*)    All other components within normal limits  D-DIMER, QUANTITATIVE (NOT AT  [facility]) - Abnormal; Notable for the following:    D-Dimer, Quant 1.72 (*)    All other components within normal limits    PROTIME-INR - Abnormal; Notable for the following:    Prothrombin Time 26.3 (*)    All other components within normal limits  URINE CULTURE  URINALYSIS, ROUTINE W REFLEX MICROSCOPIC  I-STAT TROPOININ, ED  I-STAT CG4 LACTIC ACID, ED    EKG  EKG Interpretation None       Radiology Dg Chest Port 1 View  Result Date: 04/15/2017 CLINICAL DATA:  Dyspnea, hemoptysis EXAM: PORTABLE CHEST 1 VIEW COMPARISON:  06/07/2016 chest radiograph. FINDINGS: Intact sternotomy wires. Stable aortic valve prosthesis. Single lead left subclavian pacemaker is stable in configuration. Stable mild cardiomegaly. Aortic atherosclerosis. Prominence of the aortic arch contour, which appears mildly increased in the interval. Otherwise stable and normal mediastinal contour. No pneumothorax. No pleural effusion. Surgical sutures overlie the medial right upper lung. No overt pulmonary edema. Mild bibasilar scarring versus atelectasis. No acute consolidative airspace disease. Stable mild peripheral basilar right pleural thickening. IMPRESSION: 1. Stable mild cardiomegaly without overt pulmonary edema . 2. Aortic atherosclerosis. Prominence of the aortic arch contour, which appears increased in the interval, cannot exclude growth of the known thoracic aortic aneurysm. Suggest correlation with chest CT angiogram with IV contrast. 3. Mild bibasilar scarring versus atelectasis. Electronically Signed   By: Ilona Sorrel M.D.   On: 04/15/2017 14:30    Procedures .Critical Care Performed by: Sharlett Iles Authorized by: Sharlett Iles   Critical care provider statement:    Critical care time (minutes):  30   Critical care time was exclusive of:  Separately billable procedures and treating other patients   Critical care was necessary to treat or prevent imminent or life-threatening deterioration of the following conditions:  Respiratory failure   Critical care was time spent personally by me on the following  activities:  Development of treatment plan with patient or surrogate, evaluation of patient's response to treatment, examination of patient, obtaining history from patient or surrogate, ordering and performing treatments and interventions, ordering and review of laboratory studies, ordering and review of radiographic studies, re-evaluation of patient's condition, pulse oximetry and review of old charts    (including critical care time)  Medications Ordered in ED Medications  hydrocortisone sodium succinate (SOLU-CORTEF) 100 MG injection 200 mg (not administered)  sodium chloride 0.9 % bolus 1,000 mL (1,000 mLs Intravenous New Bag/Given 04/15/17 1537)  diphenhydrAMINE (BENADRYL) injection 50 mg (50 mg Intravenous Given 04/15/17 1537)     Initial Impression / Assessment and Plan / ED Course  I have reviewed the triage vital signs and the nursing notes.  Pertinent labs & imaging results that were available during my care of the patient were reviewed by me and considered in my medical decision making (see chart for details).    Patient brought in by EMS with respiratory distress and coughing up blood began at home. She had a CT scan earlier today with contrast, was pretreated with prednisone and Benadryl, and no symptoms during the scan. On arrival, she was in mild respiratory distress,tachypneic, increased work of breathing, mentating appropriately on CPAP. Switched to bipap and obtained portable chest x-ray which shows no overt pulmonary edema or infiltrate. Questionable change in size of known aortic aneurysm. The patient denies any chest pain. Lactate normal, troponin negative, creatinine 1.03, WBC 2.7, hemoglobin 13.6. BNP is 190, I doubt CHF exacerbation given her lack of pulmonary edema on chest x-ray and  no obvious signs of fluid overload. INR was slightly supratherapeutic yesterday but is 2.37 today.   On repeat exam an hour and a half after initiation of BiPAP, the patient is breathing more  comfortably. I took her off of BiPAP and she is stable on a few liters nasal cannula. Because of her ongoing hemoptysis, I have recommended a CTA of the chest to evaluate for PE. If negative, I anticipate that she will need evaluation by pulmonology for consideration of bronchoscopy. I discussed risks and benefits of a repeat CT scan today including possibility of allergic reaction and theoretical risk of contrast-induced nephropathy. I explained that I feel the risk of potential PE likely outweighs these risks. Patient voiced understanding. Ordered hydrocortisone and Benadryl for pretreatment, patient has already had prednisone earlier today. Also order IV fluid bolus. I'm signing out to the oncoming provider who will follow-up on CTA. I anticipate the patient will require admission for further care.  Final Clinical Impressions(s) / ED Diagnoses   Final diagnoses:  None    New Prescriptions New Prescriptions   No medications on file     Andrei Mccook, Wenda Overland, MD 04/15/17 [place], Wenda Overland, MD 04/15/17 [phone]

## 2017-04-15 NOTE — ED Notes (Signed)
Patient transported to CT 

## 2017-04-16 ENCOUNTER — Inpatient Hospital Stay (HOSPITAL_COMMUNITY): Payer: Medicare Other

## 2017-04-16 DIAGNOSIS — I482 Chronic atrial fibrillation: Secondary | ICD-10-CM

## 2017-04-16 LAB — BASIC METABOLIC PANEL
Anion gap: 9 (ref 5–15)
BUN: 22 mg/dL — AB (ref 6–20)
CALCIUM: 8.2 mg/dL — AB (ref 8.9–10.3)
CO2: 25 mmol/L (ref 22–32)
CREATININE: 0.94 mg/dL (ref 0.44–1.00)
Chloride: 104 mmol/L (ref 101–111)
GFR calc Af Amer: 59 mL/min — ABNORMAL LOW (ref >60)
GFR, EST NON AFRICAN AMERICAN: 51 mL/min — AB (ref >60)
GLUCOSE: 178 mg/dL — AB (ref 65–99)
Potassium: 4 mmol/L (ref 3.5–5.1)
Sodium: 138 mmol/L (ref 135–145)

## 2017-04-16 LAB — CBC
HCT: 31.1 % — ABNORMAL LOW (ref 36.0–46.0)
HEMOGLOBIN: 10.2 g/dL — AB (ref 12.0–15.0)
MCH: 30.4 pg (ref 26.0–34.0)
MCHC: 32.8 g/dL (ref 30.0–36.0)
MCV: 92.8 fL (ref 78.0–100.0)
PLATELETS: 115 10*3/uL — AB (ref 150–400)
RBC: 3.35 MIL/uL — ABNORMAL LOW (ref 3.87–5.11)
RDW: 13.2 % (ref 11.5–15.5)
WBC: 9.6 10*3/uL (ref 4.0–10.5)

## 2017-04-16 LAB — URINE CULTURE
CULTURE: NO GROWTH
Special Requests: NORMAL

## 2017-04-16 LAB — TYPE AND SCREEN
ABO/RH(D): A POS
Antibody Screen: NEGATIVE
Weak D: POSITIVE

## 2017-04-16 LAB — STREP PNEUMONIAE URINARY ANTIGEN: STREP PNEUMO URINARY ANTIGEN: NEGATIVE

## 2017-04-16 MED ORDER — HYDRALAZINE HCL 20 MG/ML IJ SOLN: 10.0000 mg | INTRAMUSCULAR | Status: DC | PRN

## 2017-04-16 NOTE — Evaluation (Signed)
Clinical/Bedside Swallow Evaluation Patient Details  Name: [name] MRN: [id number] Date of Birth: September 12, 1922  Today's Date: 04/16/2017 Time: SLP Start Time (ACUTE ONLY): 0948 SLP Stop Time (ACUTE ONLY): 1005 SLP Time Calculation (min) (ACUTE ONLY): 17 min  Past Medical History:  Past Medical History:  Diagnosis Date  . AAA (abdominal aortic aneurysm) ([place])    a. 12/2011 U/S: 3.3 cm prox AAA.  [name] Anxiety   . Aortic insufficiency    a. S/P bioprosthetic AVR;  b. 02/2013 Echo: EF 50-55%, mild to mod AI (mean grad 71mmHg).  . Brain aneurysm    Hx of  . Chest pain    a. H/o nonobs cath;  b. 07/2013 Lexiscan MV: EF 66%, no ischemia.  . Chronic diastolic CHF (congestive heart failure) ([place])    a. 02/2013 Echo: EF 50-55%.  . Diverticulosis   . Gastritis   . Hemorrhoids   . Hepatitis B 1980's  . Hiatal hernia   . Hypertension   . Moderate mitral regurgitation    a. 02/2013 Echo: Mod MR.  . Moderate tricuspid regurgitation    a. 02/2013 Echo: Mod TR.  [name] Permanent atrial fibrillation ([place])    a. Chronic coumadin.  . Polymyalgia rheumatica ([place])   . Pulmonary hypertension ([place])    a. 02/2013 Echo: PASP 63mmHg.  . S/P aortic valve replacement with bioprosthetic valve    a. [facility].  . S/P placement of cardiac pacemaker    a.  08/2007 s/p MDT [place] PPM.  . Seizures ([place])   . Shingles   . Skin cancer    face, ear  and left hand.  . Tachy-brady syndrome ([place])    a. Severe in the setting of permanent atrial fibrillation with tachybrady syndrome;  b. 08/2007 s/p MDT [place] PPM.  . Thoracic aneurysm    a. 10/2011 CTA: 5.0 x 3.8 cm.   Past Surgical History:  Past Surgical History:  Procedure Laterality Date  . ABDOMINAL AORTIC ANEURYSM REPAIR     Resection  . AORTIC VALVE REPLACEMENT     With bioprosthetic valve  . APPENDECTOMY    . BREAST BIOPSY    . CEREBRAL ANEURYSM REPAIR    . CHOLECYSTECTOMY    . EP IMPLANTABLE DEVICE N/A 08/20/2016    Procedure: PPM Generator Changeout;  Surgeon: Deboraha Sprang, MD;  Location: [facility];  Service: Cardiovascular;  Laterality: N/A;  . LUNG BIOPSY  2005  . PACEMAKER PLACEMENT  08/2007   Medtronic; AF permanent; s/p pacemaker for bradycardia with now well controlled ventricular response; Digoxin therapy, question level; Lower extremity venous issues  . skin cancer removal     ear, face and hand  . VESICOVAGINAL FISTULA CLOSURE W/ TAH     HPI:  83 y.o.femalewith history of tachybradycardia syndrome status post pacemaker placement, permanent atrial fibrillation, bioprosthetic aortic valve replacement, abdominal aortic aneurysm status post repair, diastolic CHF, hiatal hernia,  was brought to the ER after patient was having shortness of breath with hemoptysis. Patient had gone yesterday for CAT scan of the abdomen and pelvis since patient was having mid back pain radiating to the front for last few weeks, developed cough/hemoptysis afterwards. Initially placed in [place]. CT showed multifocal PNA with possible aspiration and necrosis. Patient seen by SLP 05/12/16 with coughing on thin liquids at bedside thought due to oral candidasis, placed on nectar thick liquid with planned upgrade at bedside.    Assessment / Plan / Recommendation Clinical Impression  Patient with overt s/s of aspiration with thin liquids via straw characterized by cough post swallow, eliminated with use of small cup sips however patient continues to complain on globus and with intermittent throat clearing raising concern for both pharyngeal and esophageal dysphagia. Recommend MBS to determine extent of difficulty and least restrictive diet.  SLP Visit Diagnosis: Dysphagia, pharyngoesophageal phase (R13.14)    Aspiration Risk  Mild aspiration risk    Diet Recommendation Regular;Thin liquid   Liquid Administration via: Cup;No straw Medication Administration: Whole meds with liquid Supervision: Patient able to self  feed Compensations: Slow rate;Small sips/bites Postural Changes: Remain upright for at least 30 minutes after po intake    Other  Recommendations Oral Care Recommendations: Oral care BID   Follow up Recommendations Other (comment) (TBD)      Frequency and Duration            Prognosis        Swallow Study   General HPI: 83 y.o.femalewith history of tachybradycardia syndrome status post pacemaker placement, permanent atrial fibrillation, bioprosthetic aortic valve replacement, abdominal aortic aneurysm status post repair, diastolic CHF, hiatal hernia,  was brought to the ER after patient was having shortness of breath with hemoptysis. Patient had gone yesterday for CAT scan of the abdomen and pelvis since patient was having mid back pain radiating to the front for last few weeks, developed cough/hemoptysis afterwards. Initially placed in [place]. CT showed multifocal PNA with possible aspiration and necrosis. Patient seen by SLP 05/12/16 with coughing on thin liquids at bedside thought due to oral candidasis, placed on nectar thick liquid with planned upgrade at bedside.  Type of Study: Bedside Swallow Evaluation Previous Swallow Assessment: see HPI Diet Prior to this Study: Regular;Thin liquids Temperature Spikes Noted: No Respiratory Status: Nasal cannula History of Recent Intubation: No Behavior/Cognition: Alert;Cooperative;Pleasant mood Oral Cavity Assessment: Within Functional Limits Oral Care Completed by SLP: Recent completion by staff Oral Cavity - Dentition: Adequate natural dentition Vision: Functional for self-feeding Self-Feeding Abilities: Able to feed self Patient Positioning: Upright in bed Baseline Vocal Quality: Normal Volitional Cough: Strong Volitional Swallow: Able to elicit    Oral/Motor/Sensory Function Overall Oral Motor/Sensory Function: Within functional limits   Ice Chips Ice chips: Not tested   Thin Liquid Thin Liquid: Impaired Presentation:  Cup;Straw;Self Fed Pharyngeal  Phase Impairments: Cough - Immediate    Nectar Thick Nectar Thick Liquid: Not tested   Honey Thick Honey Thick Liquid: Not tested   Puree Puree: Within functional limits Presentation: Spoon;Self Fed   Solid   GO   Solid: Impaired Presentation: Self Fed Other Comments: c/o globus       Finola Rosal MA, CCC-SLP [phone]  Lynley Killilea Meryl 04/16/2017,10:09 AM

## 2017-04-16 NOTE — Plan of Care (Signed)
Problem: Pain Managment: Goal: General experience of comfort will improve Outcome: Progressing States shortness of breath has improved   Problem: Activity: Goal: Capacity to carry out activities will improve Outcome: Progressing Up to [facility] with walker, baseline at home   Problem: Cardiac: Goal: Ability to achieve and maintain adequate cardiopulmonary perfusion will improve Outcome: Progressing Able to wean patient down from 4 liters to 3 liters [place] and maintain sats in the 90's   Problem: Education: Goal: Ability to demonstrate managment of disease process will improve Outcome: Progressing With assistance from niece   Problem: Education: Goal: Knowledge of disease or condition will improve Outcome: Progressing Educated on pneumonia and treatment with antibiotics   Problem: Coping: Goal: Verbalizations of decreased anxiety will increase Outcome: Progressing Anxiety well controlled with home regimen

## 2017-04-16 NOTE — Progress Notes (Signed)
Pt arrived to unit from ED via stretcher escorted by RN. Pt arrived with 1 intact PIV with normal saline running, telemetry and O2 @4L  via [place]. Pt was transferred into hospital bed, telemetry applied, and antibiotics started. Pt's primarily complaint was of back pain, PRN pain medication given. Pt was informed of safety protocol and verbalized understanding to not get out of bed without calling for assistance. Pt verbalize she understands the call light system and her surrounding environment. Will continue to monitor pt.

## 2017-04-16 NOTE — Progress Notes (Signed)
PROGRESS NOTE    [name]  OIZ:[id number] DOB: 1922-01-25 DOA: 04/15/2017 PCP: Jodi Marble, MD   Brief Narrative: 83 y.o. female with history of tachybradycardia syndrome status post pacemaker placement, permanent atrial fibrillation, bioprosthetic aortic valve replacement, abdominal aortic aneurysm status post repair, diastolic CHF was brought to the ER after patient was having shortness of breath with hemoptysis. CT scan of the chest consistent with multifocal pneumonia with possible aspiration. Started on antibiotics.  Assessment & Plan:   Acute respiratory failure with hypoxia due to multifocal pneumonia: Likely secondary to aspiration: -Patient was placed on Levaquin and Flagyl IV. Continue for now. Clinically feeling better. Follow-up urine Legionella. Streptococcal antigen negative. -Follow up culture results -Swallow evaluation ordered. Aspiration precaution -Currently on 3-4 L of oxygen. Try to wean down slowly. -Order incentive spirometer.  # Hemoptysis likely in the setting of pneumonia and being on anticoagulation. Coumadin currently on hold. Monitor CBC. No further hematemesis today.  #Permanent atrial fibrillation: Continue her treated. Coumadin currently on hold because of hemoptysis.  #Hypertension: Monitor blood pressure.  #History of diastolic congestive heart failure and pulmonary hypertension: Diuretics on hold currently.   #History of abdominal aortic aneurysm status post repair: Recommended outpatient follow-up  #Tachybradycardia syndrome: Status post pacemaker placement. Continue to monitor.  PT OT evaluation.  DVT prophylaxis: SCD. Coumadin on hold Code Status: DO NOT RESUSCITATE Family Communication: Discussed with the patient's niece was also a caretaker Disposition Plan: Likely discharge home with home care in 1-2 days    Consultants:   None  Procedures: None Antimicrobials: IV Flagyl and Levaquin since 5/30  Subjective: Seen  and examined at bedside. Has cough and mild shortness of breath but overall reported feeling better than yesterday. Denied chest pain, nausea, vomiting. Patient's niece at bedside. No hemoptysis today.  Objective: Vitals:   04/15/17 2330 04/16/17 0013 04/16/17 0439 04/16/17 0822  BP:  (!) 165/67 (!) 120/54 (!) 114/43  Pulse: 62 96 63 75  Resp: (!) 26 (!) 26 18 18   Temp:  97.7 F (36.5 C) 97.5 F (36.4 C) 97.7 F (36.5 C)  TempSrc:  Oral Oral Oral  SpO2: 94% 96% 96% 97%  Weight:  67.4 kg (148 lb 9.4 oz)    Height:  5\' 3"  (1.6 m)      Intake/Output Summary (Last 24 hours) at 04/16/17 1213 Last data filed at 04/16/17 0856  Gross per 24 hour  Intake             1300 ml  Output              450 ml  Net              850 ml   Filed Weights   04/16/17 0013  Weight: 67.4 kg (148 lb 9.4 oz)    Examination:  General exam: Pleasant elderly female sitting in bed comfortable Respiratory system: Bibasal crackles, no wheezing. Respiratory effort normal.  Cardiovascular system: S1 & S2 heard, RRR.  No pedal edema. Gastrointestinal system: Abdomen is nondistended, soft and nontender. Normal bowel sounds heard. Central nervous system: Alert awake and following commands Extremities: Symmetric 5 x 5 power. Skin: No rashes, lesions or ulcers Psychiatry: Judgement and insight appear normal. Mood & affect appropriate.     Data Reviewed: I have personally reviewed following labs and imaging studies  CBC:  Recent Labs Lab 04/15/17 1410 04/15/17 2214 04/16/17 0355  WBC 2.7* 7.0 9.6  NEUTROABS  --  5.7  --   HGB  13.6 11.9* 10.2*  HCT 42.2 36.8 31.1*  MCV 92.7 92.9 92.8  PLT 121* 157 182*   Basic Metabolic Panel:  Recent Labs Lab 04/15/17 1410 04/16/17 0355  NA 135 138  K 3.9 4.0  CL 99* 104  CO2 27 25  GLUCOSE 241* 178*  BUN 23* 22*  CREATININE 1.03* 0.94  CALCIUM 9.0 8.2*   GFR: Estimated Creatinine Clearance: 35.2 mL/min (by C-G formula based on SCr of 0.94  mg/dL). Liver Function Tests: No results for input(s): AST, ALT, ALKPHOS, BILITOT, PROT, ALBUMIN in the last 168 hours. No results for input(s): LIPASE, AMYLASE in the last 168 hours. No results for input(s): AMMONIA in the last 168 hours. Coagulation Profile:  Recent Labs Lab 04/14/17 1022 04/15/17 1410  INR 3.6 2.37   Cardiac Enzymes: No results for input(s): CKTOTAL, CKMB, CKMBINDEX, TROPONINI in the last 168 hours. BNP (last 3 results) No results for input(s): PROBNP in the last 8760 hours. HbA1C: No results for input(s): HGBA1C in the last 72 hours. CBG: No results for input(s): GLUCAP in the last 168 hours. Lipid Profile: No results for input(s): CHOL, HDL, LDLCALC, TRIG, CHOLHDL, LDLDIRECT in the last 72 hours. Thyroid Function Tests: No results for input(s): TSH, T4TOTAL, FREET4, T3FREE, THYROIDAB in the last 72 hours. Anemia Panel: No results for input(s): VITAMINB12, FOLATE, FERRITIN, TIBC, IRON, RETICCTPCT in the last 72 hours. Sepsis Labs:  Recent Labs Lab 04/15/17 1422  LATICACIDVEN 1.49    Recent Results (from the past 240 hour(s))  Urine culture     Status: None   Collection Time: 04/15/17  3:39 PM  Result Value Ref Range Status   Specimen Description URINE, CLEAN CATCH  Final   Special Requests Normal  Final   Culture NO GROWTH  Final   Report Status 04/16/2017 FINAL  Final         Radiology Studies: Ct Angio Chest Pe W/cm &/or Wo Cm  Result Date: 04/15/2017 CLINICAL DATA:  Hemoptysis, respiratory distress and hypoxia. EXAM: CT ANGIOGRAPHY CHEST WITH CONTRAST TECHNIQUE: Multidetector CT imaging of the chest was performed using the standard protocol during bolus administration of intravenous contrast. Multiplanar CT image reconstructions and MIPs were obtained to evaluate the vascular anatomy. CONTRAST:  100 mL Isovue 370 COMPARISON:  Chest radiograph 04/15/2017.  Chest CT 06/07/2016 FINDINGS: Cardiovascular: No evidence for a large pulmonary emboli.  Evaluation of the right lower lobe pulmonary arteries is very limited due to extensive parenchymal disease in this area. Difficult to exclude small segmental emboli in this area. Again noted is aneurysm of the distal aortic arch and proximal descending thoracic aorta. The aneurysm now measures up to 5.6 cm, previously measured 5.3 cm. There is a prosthetic aortic valve. Atherosclerotic disease throughout the thoracic aorta. The mid descending thoracic aorta measures 4.9 cm and stable. Distal descending thoracic aorta measures 4.3 cm and stable. Pacer lead in the right ventricle. Mediastinum/Nodes: No significant chest lymphadenopathy. No large pericardial effusion. Lungs/Pleura: The trachea is patent. There is material within the bronchus intermedius and within the right lower lobe airways. There is also material within left lower lobe airways. Findings concerning for aspiration. There is extensive airspace disease throughout the right lower lobe and posterior right upper lobe. There is some airspace disease in the right middle lobe. Scattered areas of airspace disease in the left lower lobe. In addition, there is extensive pleural-based consolidation throughout the posteromedial aspect of the right lower lobe. This is not compatible with pleural fluid. There may be small  areas of lung necrosis within this pleural-based consolidation. No large pleural effusions. Upper Abdomen: Multiple hepatic cysts. Contrast refluxing into the hepatic veins. There may be left adrenal gland thickening which is similar to the previous examination. No acute abnormality in the upper abdomen. Musculoskeletal: No acute bone abnormality. Review of the MIP images confirms the above findings. IMPRESSION: Extensive airspace disease throughout both lungs, most prominent in the right lower lobe. Findings are most compatible with multifocal pneumonia. Lobulated pleural-based consolidation along the posteromedial right lung with small areas of  lung necrosis. Multiple endobronchial filling defects. Findings are suggestive for aspiration. Negative for large pulmonary emboli. Evaluation of the right lower lobar pulmonary arteries is limited due to extensive airspace disease in this area. Mild enlargement of the large aneurysm involving the posterior aortic arch and proximal descending thoracic aorta. The thoracic aortic aneurysm now measures up to 5.6 cm. No evidence for an acute rupture. These results will be called to the ordering clinician or representative by the Radiologist Assistant, and communication documented in the PACS or zVision Dashboard. Electronically Signed   By: Markus Daft M.D.   On: 04/15/2017 20:54   Dg Chest Port 1 View  Result Date: 04/15/2017 CLINICAL DATA:  Dyspnea, hemoptysis EXAM: PORTABLE CHEST 1 VIEW COMPARISON:  06/07/2016 chest radiograph. FINDINGS: Intact sternotomy wires. Stable aortic valve prosthesis. Single lead left subclavian pacemaker is stable in configuration. Stable mild cardiomegaly. Aortic atherosclerosis. Prominence of the aortic arch contour, which appears mildly increased in the interval. Otherwise stable and normal mediastinal contour. No pneumothorax. No pleural effusion. Surgical sutures overlie the medial right upper lung. No overt pulmonary edema. Mild bibasilar scarring versus atelectasis. No acute consolidative airspace disease. Stable mild peripheral basilar right pleural thickening. IMPRESSION: 1. Stable mild cardiomegaly without overt pulmonary edema . 2. Aortic atherosclerosis. Prominence of the aortic arch contour, which appears increased in the interval, cannot exclude growth of the known thoracic aortic aneurysm. Suggest correlation with chest CT angiogram with IV contrast. 3. Mild bibasilar scarring versus atelectasis. Electronically Signed   By: Ilona Sorrel M.D.   On: 04/15/2017 14:30        Scheduled Meds: . busPIRone  7.5 mg Oral BID  . diclofenac sodium  4 g Topical BID  .  latanoprost  1 drop Both Eyes QHS  . LORazepam  0.5 mg Oral TID  . pantoprazole  40 mg Oral Daily  . traZODone  50 mg Oral QHS   Continuous Infusions: . levofloxacin (LEVAQUIN) IV Stopped (04/16/17 0409)  . metronidazole Stopped (04/16/17 0629)     LOS: 1 day    Reese Stockman Tanna Furry, MD [facility] Pager [phone]  If 7PM-7AM, please contact night-coverage [email or web address] Password [email or web address] 04/16/2017, 12:13 PM

## 2017-04-16 NOTE — Progress Notes (Signed)
Modified Barium Swallow Progress Note  Patient Details  Name: [name] MRN: [id number] Date of Birth: Aug 06, 1922  Today's Date: 04/16/2017  Modified Barium Swallow completed.  Full report located under Chart Review in the Imaging Section.  Brief recommendations include the following:  Clinical Impression  Patient presents with a mild pharyngoesophageal dysphagia characterized by mild and intermittently delayed swallow initiation, tight UES, and decreased laryngeal closure,  resulting in penetration to the level of the vocal cords with thin liquids inconsistently both before and during the swallow. Penetration trace and clears with cued throat clear. Mild CP residuals additionally clear with spontaneous dry swallow. Recommend continuation of regular solids, thin liquids with aspiration precautions/compensatory strategies to minimize aspiration risk.    Swallow Evaluation Recommendations       SLP Diet Recommendations: Regular solids;Thin liquid   Liquid Administration via: Cup;Straw   Medication Administration: Whole meds with liquid   Supervision: Patient able to self feed;Intermittent supervision to cue for compensatory strategies   Compensations: Slow rate;Small sips/bites;Clear throat intermittently;Follow solids with liquid   Postural Changes: Seated upright at 90 degrees;Remain semi-upright after after feeds/meals (Comment)   Oral Care Recommendations: Oral care BID      Gabriel Rainwater MA, CCC-SLP [phone]   Alania Overholt Meryl 04/16/2017,1:20 PM

## 2017-04-16 NOTE — Progress Notes (Signed)
Patient stable on 7 a to 7 p shift, no hemoptysis reported to RN on this shift.  Patient states her shortness of breath is much improved, was able to wean down from 4 liters Hydro to 3 liters maintaining sats in the mid to upper 90's.  Patient sat up in chair this shift, gets up with walker at baseline.  Niece and sister in room majority of shift.

## 2017-04-17 DIAGNOSIS — D649 Anemia, unspecified: Secondary | ICD-10-CM

## 2017-04-17 DIAGNOSIS — I272 Pulmonary hypertension, unspecified: Secondary | ICD-10-CM

## 2017-04-17 DIAGNOSIS — I495 Sick sinus syndrome: Secondary | ICD-10-CM

## 2017-04-17 DIAGNOSIS — J189 Pneumonia, unspecified organism: Secondary | ICD-10-CM | POA: Diagnosis present

## 2017-04-17 LAB — LEGIONELLA PNEUMOPHILA SEROGP 1 UR AG: L. pneumophila Serogp 1 Ur Ag: NEGATIVE

## 2017-04-17 LAB — CBC
HCT: 27.5 % — ABNORMAL LOW (ref 36.0–46.0)
Hemoglobin: 8.9 g/dL — ABNORMAL LOW (ref 12.0–15.0)
MCH: 30 pg (ref 26.0–34.0)
MCHC: 32.4 g/dL (ref 30.0–36.0)
MCV: 92.6 fL (ref 78.0–100.0)
Platelets: 94 10*3/uL — ABNORMAL LOW (ref 150–400)
RBC: 2.97 MIL/uL — ABNORMAL LOW (ref 3.87–5.11)
RDW: 13 % (ref 11.5–15.5)
WBC: 5.6 10*3/uL (ref 4.0–10.5)

## 2017-04-17 LAB — PROTIME-INR
INR: 2.87
Prothrombin Time: 30.7 seconds — ABNORMAL HIGH (ref 11.4–15.2)

## 2017-04-17 MED ORDER — CLINDAMYCIN PHOSPHATE 300 MG/50ML IV SOLN
300.0000 mg | Freq: Three times a day (TID) | INTRAVENOUS | Status: DC
  Administered 2017-04-17 – 2017-04-21 (×13): 300 mg via INTRAVENOUS
  Filled 2017-04-17 (×14): qty 50

## 2017-04-17 MED ORDER — SACCHAROMYCES BOULARDII 250 MG PO CAPS
250.0000 mg | ORAL_CAPSULE | Freq: Two times a day (BID) | ORAL | Status: DC
  Administered 2017-04-17 – 2017-04-23 (×13): 250 mg via ORAL
  Filled 2017-04-17 (×13): qty 1

## 2017-04-17 MED ORDER — TORSEMIDE 20 MG PO TABS
20.0000 mg | ORAL_TABLET | ORAL | Status: DC
  Administered 2017-04-18 – 2017-04-20 (×2): 20 mg via ORAL
  Filled 2017-04-17 (×4): qty 1

## 2017-04-17 NOTE — Progress Notes (Addendum)
[facility] PROGRESS NOTE    Progress Note  [name]  DGL:[id number] DOB: 12-Sep-1922 DOA: 04/15/2017 PCP: Jodi Marble, MD     Brief Narrative:   [name] is an 83 y.o. female past medical history of tachybradycardia syndrome, status post pacemaker placement, chronic atrial fibrillation, a bioprosthetic aortic valve on Coumadin, status post abdominal aortic aneurysm repair, chronic diastolic heart failure is brought into the ED for shortness of breath and hemoptysis started the day prior to admission. In the ED a CT scan of the chest was done that show multifocal pneumonia and possible aspiration with some necrosis  Assessment/Plan:   Acute respiratory failure with hypoxia ([place]) Due to multifocal pneumonia question due to aspiration: Patienton IV Levaquin and Flagyl. Legionella and streptococcal antigen were negative, culture date is negative. Swallowing  elevation was done delayed initiation with tight you ES and decreased laryngeal closure resulting penetration to the level of the vocal cords with thin liquids not seen in previous studies. Speech recommended regular solids with thin liquids and aspiration precaution. PT consult.  Hemoptysis: In the setting of therapeutic INR with ongoing multifocal pneumonia. She has had no further hemoptysis Coumadin was held and admission. INR is therapeutic. Resume Coumadin on 04/18/2017.  Chronic ATRIAL FIBRILLATION/tachybradycardia syndrome: We Rate control continue Coumadin. No events. Discontinue telemetry.  Normocytic anemia: On 08/20/2016 her hemoglobin is 15, on 04/15/2017 a was 11.9, on 04/17/2017 her hemoglobin is now 8.9. Unlikely to be from hemoptysis, no black stools or red stools. Possibly having intermittent bleeding at home, she's currently not actively bleeding. At her age with multiple comorbidities unlikely to be a candidate for further surgical intervention, unless she is actively bleeding. We'll  continue to monitor. The patient denies melanotic stools or red stools. We'll check FOBT. Check a CBC tomorrow morning, if there is a drop in her hemoglobin will have to consult cardiology and see if Coumadin can be discontinued.  Essential hypertension: Stable continue current regimen.  Chronic diastolic heart failure with pulmonary hypertension:  diuretics continue to be on hold. Resume tomorrow.   DVT prophylaxis: coumadin Family Communication: Nice Disposition Plan/Barrier to D/C: hopefully in 2-3 days Code Status:     Code Status Orders        Start     Ordered   04/15/17 2325  Do not attempt resuscitation (DNR)  Continuous    Question Answer Comment  In the event of cardiac or respiratory ARREST Do not call a "code blue"   In the event of cardiac or respiratory ARREST Do not perform Intubation, CPR, defibrillation or ACLS   In the event of cardiac or respiratory ARREST Use medication by any route, position, wound care, and other measures to relive pain and suffering. May use oxygen, suction and manual treatment of airway obstruction as needed for comfort.      04/15/17 2325    Code Status History    Date Active Date Inactive Code Status Order ID Comments User Context   08/20/2016  4:18 PM 08/20/2016  9:16 PM Full Code [id number]  Deboraha Sprang, MD Inpatient   05/10/2016  6:37 PM 05/14/2016  6:54 PM Full Code [id number]  Waldemar Dickens, MD ED    Advance Directive Documentation     Most Recent Value  Type of Advance Directive  Living will  Pre-existing out of facility DNR order (yellow form or pink MOST form)  -  "MOST" Form in Place?  -  IV Access:    Peripheral IV   Procedures and diagnostic studies:   Ct Angio Chest Pe W/cm &/or Wo Cm  Result Date: 04/15/2017 CLINICAL DATA:  Hemoptysis, respiratory distress and hypoxia. EXAM: CT ANGIOGRAPHY CHEST WITH CONTRAST TECHNIQUE: Multidetector CT imaging of the chest was performed using the standard protocol  during bolus administration of intravenous contrast. Multiplanar CT image reconstructions and MIPs were obtained to evaluate the vascular anatomy. CONTRAST:  100 mL Isovue 370 COMPARISON:  Chest radiograph 04/15/2017.  Chest CT 06/07/2016 FINDINGS: Cardiovascular: No evidence for a large pulmonary emboli. Evaluation of the right lower lobe pulmonary arteries is very limited due to extensive parenchymal disease in this area. Difficult to exclude small segmental emboli in this area. Again noted is aneurysm of the distal aortic arch and proximal descending thoracic aorta. The aneurysm now measures up to 5.6 cm, previously measured 5.3 cm. There is a prosthetic aortic valve. Atherosclerotic disease throughout the thoracic aorta. The mid descending thoracic aorta measures 4.9 cm and stable. Distal descending thoracic aorta measures 4.3 cm and stable. Pacer lead in the right ventricle. Mediastinum/Nodes: No significant chest lymphadenopathy. No large pericardial effusion. Lungs/Pleura: The trachea is patent. There is material within the bronchus intermedius and within the right lower lobe airways. There is also material within left lower lobe airways. Findings concerning for aspiration. There is extensive airspace disease throughout the right lower lobe and posterior right upper lobe. There is some airspace disease in the right middle lobe. Scattered areas of airspace disease in the left lower lobe. In addition, there is extensive pleural-based consolidation throughout the posteromedial aspect of the right lower lobe. This is not compatible with pleural fluid. There may be small areas of lung necrosis within this pleural-based consolidation. No large pleural effusions. Upper Abdomen: Multiple hepatic cysts. Contrast refluxing into the hepatic veins. There may be left adrenal gland thickening which is similar to the previous examination. No acute abnormality in the upper abdomen. Musculoskeletal: No acute bone abnormality.  Review of the MIP images confirms the above findings. IMPRESSION: Extensive airspace disease throughout both lungs, most prominent in the right lower lobe. Findings are most compatible with multifocal pneumonia. Lobulated pleural-based consolidation along the posteromedial right lung with small areas of lung necrosis. Multiple endobronchial filling defects. Findings are suggestive for aspiration. Negative for large pulmonary emboli. Evaluation of the right lower lobar pulmonary arteries is limited due to extensive airspace disease in this area. Mild enlargement of the large aneurysm involving the posterior aortic arch and proximal descending thoracic aorta. The thoracic aortic aneurysm now measures up to 5.6 cm. No evidence for an acute rupture. These results will be called to the ordering clinician or representative by the Radiologist Assistant, and communication documented in the PACS or zVision Dashboard. Electronically Signed   By: Markus Daft M.D.   On: 04/15/2017 20:54   Dg Chest Port 1 View  Result Date: 04/15/2017 CLINICAL DATA:  Dyspnea, hemoptysis EXAM: PORTABLE CHEST 1 VIEW COMPARISON:  06/07/2016 chest radiograph. FINDINGS: Intact sternotomy wires. Stable aortic valve prosthesis. Single lead left subclavian pacemaker is stable in configuration. Stable mild cardiomegaly. Aortic atherosclerosis. Prominence of the aortic arch contour, which appears mildly increased in the interval. Otherwise stable and normal mediastinal contour. No pneumothorax. No pleural effusion. Surgical sutures overlie the medial right upper lung. No overt pulmonary edema. Mild bibasilar scarring versus atelectasis. No acute consolidative airspace disease. Stable mild peripheral basilar right pleural thickening. IMPRESSION: 1. Stable mild cardiomegaly without overt pulmonary edema . 2.  Aortic atherosclerosis. Prominence of the aortic arch contour, which appears increased in the interval, cannot exclude growth of the known thoracic  aortic aneurysm. Suggest correlation with chest CT angiogram with IV contrast. 3. Mild bibasilar scarring versus atelectasis. Electronically Signed   By: Ilona Sorrel M.D.   On: 04/15/2017 14:30   Dg Swallowing Func-speech Pathology  Result Date: 04/16/2017 Objective Swallowing Evaluation: Type of Study: MBS-Modified Barium Swallow Study Patient Details Name: [name] MRN: [id number] Date of Birth: 05-Jun-1922 Today's Date: 04/16/2017 Time: SLP Start Time (ACUTE ONLY): 1139-SLP Stop Time (ACUTE ONLY): 1202 SLP Time Calculation (min) (ACUTE ONLY): 23 min Past Medical History: Past Medical History: Diagnosis Date . AAA (abdominal aortic aneurysm) ([place])   a. 12/2011 U/S: 3.3 cm prox AAA. [name] Anxiety  . Aortic insufficiency   a. S/P bioprosthetic AVR;  b. 02/2013 Echo: EF 50-55%, mild to mod AI (mean grad 37mmHg). . Brain aneurysm   Hx of . Chest pain   a. H/o nonobs cath;  b. 07/2013 Lexiscan MV: EF 66%, no ischemia. . Chronic diastolic CHF (congestive heart failure) ([place])   a. 02/2013 Echo: EF 50-55%. . Diverticulosis  . Gastritis  . Hemorrhoids  . Hepatitis B 1980's . Hiatal hernia  . Hypertension  . Moderate mitral regurgitation   a. 02/2013 Echo: Mod MR. . Moderate tricuspid regurgitation   a. 02/2013 Echo: Mod TR. [name] Permanent atrial fibrillation ([place])   a. Chronic coumadin. . Polymyalgia rheumatica ([place])  . Pulmonary hypertension ([place])   a. 02/2013 Echo: PASP 27mmHg. . S/P aortic valve replacement with bioprosthetic valve   a. [facility]. . S/P placement of cardiac pacemaker   a.  08/2007 s/p MDT [place] PPM. . Seizures ([place])  . Shingles  . Skin cancer   face, ear  and left hand. . Tachy-brady syndrome ([place])   a. Severe in the setting of permanent atrial fibrillation with tachybrady syndrome;  b. 08/2007 s/p MDT [place] PPM. . Thoracic aneurysm   a. 10/2011 CTA: 5.0 x 3.8 cm. Past Surgical History: Past Surgical History: Procedure Laterality Date . ABDOMINAL AORTIC ANEURYSM REPAIR     Resection . AORTIC VALVE REPLACEMENT    With bioprosthetic valve . APPENDECTOMY   . BREAST BIOPSY   . CEREBRAL ANEURYSM REPAIR   . CHOLECYSTECTOMY   . EP IMPLANTABLE DEVICE N/A 08/20/2016  Procedure: PPM Generator Changeout;  Surgeon: Deboraha Sprang, MD;  Location: [facility];  Service: Cardiovascular;  Laterality: N/A; . LUNG BIOPSY  2005 . PACEMAKER PLACEMENT  08/2007  [employer]; AF permanent; s/p pacemaker for bradycardia with now well controlled ventricular response; Digoxin therapy, question level; Lower extremity venous issues . skin cancer removal    ear, face and hand . VESICOVAGINAL FISTULA CLOSURE W/ TAH   HPI: 83 y.o.femalewith history of tachybradycardia syndrome status post pacemaker placement, permanent atrial fibrillation, bioprosthetic aortic valve replacement, abdominal aortic aneurysm status post repair, diastolic CHF, hiatal hernia,  was brought to the ER after patient was having shortness of breath with hemoptysis. Patient had gone yesterday for CAT scan of the abdomen and pelvis since patient was having mid back pain radiating to the front for last few weeks, developed cough/hemoptysis afterwards. Initially placed in [place]. CT showed multifocal PNA with possible aspiration and necrosis. Patient seen by SLP 05/12/16 with coughing on thin liquids at bedside thought due to oral candidasis, placed on nectar thick liquid with planned upgrade at bedside.  No Data Recorded Assessment / Plan /  Recommendation CHL IP CLINICAL IMPRESSIONS 04/16/2017 Clinical Impression Patient presents with a mild pharyngoesophageal dysphagia characterized by mild and intermittently delayed swallow initiation, tight UES, and decreased laryngeal closure,  resulting in penetration to the level of the vocal cords with thin liquids inconsistently both before and during the swallow. Penetration trace and clears with cued throat clear. Mild CP residuals additionally clear with spontaneous dry swallow. Recommend  continuation of regular solids, thin liquids with aspiration precautions/compensatory strategies to minimize aspiration risk.  SLP Visit Diagnosis Dysphagia, pharyngoesophageal phase (R13.14) Attention and concentration deficit following -- Frontal lobe and executive function deficit following -- Impact on safety and function Mild aspiration risk   CHL IP TREATMENT RECOMMENDATION 04/16/2017 Treatment Recommendations Therapy as outlined in treatment plan below   Prognosis 05/12/2016 Prognosis for Safe Diet Advancement Fair Barriers to Reach Goals Time post onset Barriers/Prognosis Comment -- CHL IP DIET RECOMMENDATION 04/16/2017 SLP Diet Recommendations Regular solids;Thin liquid Liquid Administration via Cup;Straw Medication Administration Whole meds with liquid Compensations Slow rate;Small sips/bites;Clear throat intermittently;Follow solids with liquid Postural Changes Seated upright at 90 degrees;Remain semi-upright after after feeds/meals (Comment)   CHL IP OTHER RECOMMENDATIONS 04/16/2017 Recommended Consults -- Oral Care Recommendations Oral care BID Other Recommendations --   CHL IP FOLLOW UP RECOMMENDATIONS 04/16/2017 Follow up Recommendations None   CHL IP FREQUENCY AND DURATION 04/16/2017 Speech Therapy Frequency (ACUTE ONLY) min 1 x/week Treatment Duration 1 week      CHL IP ORAL PHASE 04/16/2017 Oral Phase WFL Oral - Pudding Teaspoon -- Oral - Pudding Cup -- Oral - Honey Teaspoon -- Oral - Honey Cup -- Oral - Nectar Teaspoon -- Oral - Nectar Cup -- Oral - Nectar Straw -- Oral - Thin Teaspoon -- Oral - Thin Cup -- Oral - Thin Straw -- Oral - Puree -- Oral - Mech Soft -- Oral - Regular -- Oral - Multi-Consistency -- Oral - Pill -- Oral Phase - Comment --  CHL IP PHARYNGEAL PHASE 04/16/2017 Pharyngeal Phase Impaired Pharyngeal- Pudding Teaspoon -- Pharyngeal -- Pharyngeal- Pudding Cup -- Pharyngeal -- Pharyngeal- Honey Teaspoon -- Pharyngeal -- Pharyngeal- Honey Cup -- Pharyngeal -- Pharyngeal- Nectar Teaspoon --  Pharyngeal -- Pharyngeal- Nectar Cup Pharyngeal residue - cp segment Pharyngeal -- Pharyngeal- Nectar Straw -- Pharyngeal -- Pharyngeal- Thin Teaspoon -- Pharyngeal -- Pharyngeal- Thin Cup Pharyngeal residue - cp segment;Penetration/Aspiration during swallow;Delayed swallow initiation-pyriform sinuses;Penetration/Aspiration before swallow Pharyngeal Material enters airway, CONTACTS cords and not ejected out Pharyngeal- Thin Straw Pharyngeal residue - cp segment;Penetration/Aspiration during swallow Pharyngeal Material enters airway, CONTACTS cords and not ejected out Pharyngeal- Puree Pharyngeal residue - cp segment Pharyngeal -- Pharyngeal- Mechanical Soft Pharyngeal residue - cp segment Pharyngeal -- Pharyngeal- Regular -- Pharyngeal -- Pharyngeal- Multi-consistency -- Pharyngeal -- Pharyngeal- Pill -- Pharyngeal -- Pharyngeal Comment --  CHL IP CERVICAL ESOPHAGEAL PHASE 04/16/2017 Cervical Esophageal Phase Impaired Pudding Teaspoon -- Pudding Cup -- Honey Teaspoon -- Honey Cup -- Nectar Teaspoon -- Nectar Cup -- Nectar Straw -- Thin Teaspoon -- Thin Cup -- Thin Straw -- Puree -- Mechanical Soft -- Regular -- Multi-consistency -- Pill -- Cervical Esophageal Comment tight UES CHL IP GO 05/12/2016 Functional Assessment Tool Used clinical judgement Functional Limitations Swallowing Swallow Current Status (Z6109) CJ Swallow Goal Status (U0454) CI Swallow Discharge Status (U9811) (None) Motor Speech Current Status (B1478) (None) Motor Speech Goal Status (G9562) (None) Motor Speech Goal Status (Z3086) (None) Spoken Language Comprehension Current Status (V7846) (None) Spoken Language Comprehension Goal Status (N6295) (None) Spoken Language Comprehension Discharge Status (M8413) (None) Spoken  Language Expression Current Status [phone]) (None) Spoken Language Expression Goal Status [phone]) (None) Spoken Language Expression Discharge Status [id number]) (None) Attention Current Status [phone]) (None) Attention Goal Status (I1443)  (None) Attention Discharge Status [phone]) (None) Memory Current Status (Q6761) (None) Memory Goal Status (P5093) (None) Memory Discharge Status (O6712) (None) Voice Current Status (W5809) (None) Voice Goal Status (X8338) (None) Voice Discharge Status (S5053) (None) Other Speech-Language Pathology Functional Limitation Current Status (Z7673) (None) Other Speech-Language Pathology Functional Limitation Goal Status (A1937) (None) Other Speech-Language Pathology Functional Limitation Discharge Status [phone]) (None) Gabriel Rainwater MA, CCC-SLP [phone] [place] Leah Meryl 04/16/2017, 1:20 PM                Medical Consultants:    None.  Anti-Infectives:   Clindamycin  Subjective:    [name] she relates her shortness of breath is improved, she denies any further hemoptysis. She relates her appetite has returned.  Objective:    Vitals:   04/16/17 2038 04/17/17 0554 04/17/17 0613 04/17/17 0614  BP: (!) 102/46 (!) 113/44    Pulse: 76 60    Resp: 20 18    Temp: 97.8 F (36.6 C) 97.4 F (36.3 C)    TempSrc: Oral Oral    SpO2: 98% 93% 95% 96%  Weight:  67.9 kg (149 lb 11.2 oz)    Height:        Intake/Output Summary (Last 24 hours) at 04/17/17 0921 Last data filed at 04/17/17 0554  Gross per 24 hour  Intake              820 ml  Output              450 ml  Net              370 ml   Filed Weights   04/16/17 0013 04/17/17 0554  Weight: 67.4 kg (148 lb 9.4 oz) 67.9 kg (149 lb 11.2 oz)    Exam: General exam: In no acute distress, laying comfortably in bed Respiratory system: Good air movement and clear to auscultation. Cardiovascular system: S1 & S2 heard, RRR. No JVD. Gastrointestinal system: Abdomen is nondistended, soft and nontender.  Central nervous system: Alert and oriented 3 no focal neurological deficits. Extremities: No pedal edema. Skin: No rashes, lesions or ulcers Psychiatry: Judgment and insight appear intact.    Data Reviewed:    Labs: Basic  Metabolic Panel:  Recent Labs Lab 04/15/17 1410 04/16/17 0355  NA 135 138  K 3.9 4.0  CL 99* 104  CO2 27 25  GLUCOSE 241* 178*  BUN 23* 22*  CREATININE 1.03* 0.94  CALCIUM 9.0 8.2*   GFR Estimated Creatinine Clearance: 35.3 mL/min (by C-G formula based on SCr of 0.94 mg/dL). Liver Function Tests: No results for input(s): AST, ALT, ALKPHOS, BILITOT, PROT, ALBUMIN in the last 168 hours. No results for input(s): LIPASE, AMYLASE in the last 168 hours. No results for input(s): AMMONIA in the last 168 hours. Coagulation profile  Recent Labs Lab 04/14/17 1022 04/15/17 1410 04/17/17 0348  INR 3.6 2.37 2.87    CBC:  Recent Labs Lab 04/15/17 1410 04/15/17 2214 04/16/17 0355 04/17/17 0348  WBC 2.7* 7.0 9.6 5.6  NEUTROABS  --  5.7  --   --   HGB 13.6 11.9* 10.2* 8.9*  HCT 42.2 36.8 31.1* 27.5*  MCV 92.7 92.9 92.8 92.6  PLT 121* 157 115* 94*   Cardiac Enzymes: No results for input(s): CKTOTAL, CKMB, CKMBINDEX, TROPONINI in the last 168 hours.  BNP (last 3 results) No results for input(s): PROBNP in the last 8760 hours. CBG: No results for input(s): GLUCAP in the last 168 hours. D-Dimer:  Recent Labs  04/15/17 1410  DDIMER 1.72*   Hgb A1c: No results for input(s): HGBA1C in the last 72 hours. Lipid Profile: No results for input(s): CHOL, HDL, LDLCALC, TRIG, CHOLHDL, LDLDIRECT in the last 72 hours. Thyroid function studies: No results for input(s): TSH, T4TOTAL, T3FREE, THYROIDAB in the last 72 hours.  Invalid input(s): FREET3 Anemia work up: No results for input(s): VITAMINB12, FOLATE, FERRITIN, TIBC, IRON, RETICCTPCT in the last 72 hours. Sepsis Labs:  Recent Labs Lab 04/15/17 1410 04/15/17 1422 04/15/17 2214 04/16/17 0355 04/17/17 0348  WBC 2.7*  --  7.0 9.6 5.6  LATICACIDVEN  --  1.49  --   --   --    Microbiology Recent Results (from the past 240 hour(s))  Urine culture     Status: None   Collection Time: 04/15/17  3:39 PM  Result Value Ref  Range Status   Specimen Description URINE, CLEAN CATCH  Final   Special Requests Normal  Final   Culture NO GROWTH  Final   Report Status 04/16/2017 FINAL  Final     Medications:   . busPIRone  7.5 mg Oral BID  . diclofenac sodium  4 g Topical BID  . latanoprost  1 drop Both Eyes QHS  . LORazepam  0.5 mg Oral TID  . pantoprazole  40 mg Oral Daily  . traZODone  50 mg Oral QHS   Continuous Infusions: . levofloxacin (LEVAQUIN) IV Stopped (04/16/17 0409)  . metronidazole Stopped (04/17/17 9528)    Time spent: 35 min   LOS: 2 days   Charlynne Cousins  [facility] Pager [phone]  *Please refer to [email or web address], password [email or web address] to get updated schedule on who will round on this patient, as hospitalists switch teams weekly. If 7PM-7AM, please contact night-coverage at [email or web address], password [email or web address] for any overnight needs.  04/17/2017, 9:21 AM

## 2017-04-17 NOTE — Evaluation (Signed)
Physical Therapy Evaluation Patient Details Name: [name] MRN: [id number] DOB: Apr 01, 1922 Today's Date: 04/17/2017   History of Present Illness  83 yo admitted with resp failure with PNA. PMHx: tachybrady syndrome, pacemaker, AAA repair, AFib, AVR, CHF, PVD  Clinical Impression  Pt very pleasant in room with niece and stating she normally cares for herself and walks with rollator or RW. She can normally ambulate up to 300' at a time with AD but currently limited to 83' due to SOB. Pt with decreased strength, balance, activity tolerance and functional mobility who will benefit from acute therapy to maximize mobility, gait, and independence to decrease burden of care for return home with niece.   HR 75-83 sats 92-94% on 2L    Follow Up Recommendations Home health PT;Supervision/Assistance - 24 hour    Equipment Recommendations  Rolling walker with 5" wheels (youth)    Recommendations for Other Services       Precautions / Restrictions Precautions Precautions: Fall Precaution Comments: fragile and sensitive skin      Mobility  Bed Mobility Overal bed mobility: Needs Assistance Bed Mobility: Rolling;Sidelying to Sit Rolling: Min guard Sidelying to sit: Min assist       General bed mobility comments: cues for sequence as pt unable to pivot or transition supine to sit as she initially attempted to do  Transfers Overall transfer level: Needs assistance   Transfers: Sit to/from Stand Sit to Stand: Min guard         General transfer comment: cues for hand placement on surface not walker from bed and BSC  Ambulation/Gait Ambulation/Gait assistance: Min guard Ambulation Distance (Feet): 35 Feet Assistive device: Rolling walker (2 wheeled) Gait Pattern/deviations: Step-through pattern;Decreased stride length   Gait velocity interpretation: at or above normal speed for age/gender General Gait Details: cues for posture and position in RW. Pt walked 35' then 10'  after toileting. Fatigued with SpO2 92% on 2L with activity  Stairs            Wheelchair Mobility    Modified Rankin (Stroke Patients Only)       Balance Overall balance assessment: Needs assistance   Sitting balance-Leahy Scale: Good       Standing balance-Leahy Scale: Poor                               Pertinent Vitals/Pain Pain Assessment: No/denies pain    Home Living Family/patient expects to be discharged to:: Private residence Living Arrangements: Other relatives Available Help at Discharge: Family;Available 24 hours/day Type of Home: House Home Access: Elevator     Home Layout: Two level;Able to live on main level with bedroom/bathroom Home Equipment: Walker - 4 wheels;Walker - 2 wheels;Wheelchair - [employer];Shower seat Additional Comments: lives with niece    Prior Function Level of Independence: Independent with assistive device(s)         Comments: uses walker, walks home distance, WC for community, family does homemaking     Journalist, newspaper        Extremity/Trunk Assessment   Upper Extremity Assessment Upper Extremity Assessment: Generalized weakness    Lower Extremity Assessment Lower Extremity Assessment: Generalized weakness    Cervical / Trunk Assessment Cervical / Trunk Assessment: Normal  Communication   Communication: No difficulties  Cognition Arousal/Alertness: Awake/alert Behavior During Therapy: WFL for tasks assessed/performed Overall Cognitive Status: Within Functional Limits for tasks assessed  General Comments      Exercises     Assessment/Plan    PT Assessment Patient needs continued PT services  PT Problem List Decreased strength;Decreased mobility;Decreased safety awareness;Decreased activity tolerance;Decreased knowledge of use of DME;Decreased balance;Cardiopulmonary status limiting activity       PT Treatment  Interventions Gait training;Therapeutic exercise;Patient/family education;DME instruction;Therapeutic activities;Balance training;Functional mobility training    PT Goals (Current goals can be found in the Care Plan section)  Acute Rehab PT Goals Patient Stated Goal: return home PT Goal Formulation: With patient/family Time For Goal Achievement: 05/01/17 Potential to Achieve Goals: Good    Frequency Min 3X/week   Barriers to discharge        Co-evaluation               AM-PAC PT "6 Clicks" Daily Activity  Outcome Measure Difficulty turning over in bed (including adjusting bedclothes, sheets and blankets)?: A Little Difficulty moving from lying on back to sitting on the side of the bed? : Total Difficulty sitting down on and standing up from a chair with arms (e.g., wheelchair, bedside commode, etc,.)?: A Lot Help needed moving to and from a bed to chair (including a wheelchair)?: A Little Help needed walking in hospital room?: A Little Help needed climbing 3-5 steps with a railing? : Total 6 Click Score: 13    End of Session Equipment Utilized During Treatment: Oxygen Activity Tolerance: Patient tolerated treatment well Patient left: in chair;with call bell/phone within reach;with chair alarm set;with family/visitor present Nurse Communication: Mobility status;Precautions PT Visit Diagnosis: Other abnormalities of gait and mobility (R26.89);Difficulty in walking, not elsewhere classified (R26.2);Muscle weakness (generalized) (M62.81)    Time: 8592-9244 PT Time Calculation (min) (ACUTE ONLY): 30 min   Charges:   PT Evaluation $PT Eval Moderate Complexity: 1 Procedure PT Treatments $Therapeutic Activity: 8-22 mins   PT G Codes:        Elwyn Reach, PT [phone]   [place] [facility] Wane [place] 04/17/2017, 1:35 PM

## 2017-04-17 NOTE — Progress Notes (Signed)
  Speech Language Pathology Treatment: Dysphagia  Patient Details Name: [name] MRN: [id number] DOB: Dec 24, 1921 Today's Date: 04/17/2017 Time: 4098-1191 SLP Time Calculation (min) (ACUTE ONLY): 15 min  Assessment / Plan / Recommendation Clinical Impression  F/u after yesterday's MBS.  Reviewed results/recs including penetration, the value of intermittently throat-clearing to eject penetrate from larynx, the potential for even a mild dysphagia to put pt at slightly higher risk for aspiration PNAs, particularly if she decompensates from an acute illness that is otherwise not directly related to swallowing.  Pt verbalized understanding. Will follow briefly for toleration, further education.   HPI HPI: 83 y.o.femalewith history of tachybradycardia syndrome status post pacemaker placement, permanent atrial fibrillation, bioprosthetic aortic valve replacement, abdominal aortic aneurysm status post repair, diastolic CHF, hiatal hernia,  was brought to the ER after patient was having shortness of breath with hemoptysis. Patient had gone yesterday for CAT scan of the abdomen and pelvis since patient was having mid back pain radiating to the front for last few weeks, developed cough/hemoptysis afterwards. Initially placed in [place]. CT showed multifocal PNA with possible aspiration and necrosis. Patient seen by SLP 05/12/16 with coughing on thin liquids at bedside thought due to oral candidasis, placed on nectar thick liquid with planned upgrade at bedside.       SLP Plan  Continue with current plan of care       Recommendations  Diet recommendations: Regular;Thin liquid Liquids provided via: Cup;Straw Medication Administration: Whole meds with liquid Compensations: Slow rate;Small sips/bites;Clear throat intermittently;Follow solids with liquid                Oral Care Recommendations: Oral care BID Follow up Recommendations: None SLP Visit Diagnosis: Dysphagia, pharyngoesophageal phase  (R13.14) Plan: Continue with current plan of care       GO                Juan Quam Laurice 04/17/2017, 11:47 AM

## 2017-04-17 NOTE — Progress Notes (Signed)
Was called in patients room by the niece,patient just had another episode of coughing out blood. MD paged ,made aware with orders to discontinue Pharmacy consult for Coumadin dosing. Will monitor accordingly.

## 2017-04-18 LAB — PROTIME-INR
INR: 2.07
Prothrombin Time: 23.6 seconds — ABNORMAL HIGH (ref 11.4–15.2)

## 2017-04-18 LAB — CBC
HCT: 27 % — ABNORMAL LOW (ref 36.0–46.0)
HEMOGLOBIN: 8.6 g/dL — AB (ref 12.0–15.0)
MCH: 29.6 pg (ref 26.0–34.0)
MCHC: 31.9 g/dL (ref 30.0–36.0)
MCV: 92.8 fL (ref 78.0–100.0)
Platelets: 87 10*3/uL — ABNORMAL LOW (ref 150–400)
RBC: 2.91 MIL/uL — AB (ref 3.87–5.11)
RDW: 13 % (ref 11.5–15.5)
WBC: 4.8 10*3/uL (ref 4.0–10.5)

## 2017-04-18 LAB — EXPECTORATED SPUTUM ASSESSMENT W REFEX TO RESP CULTURE

## 2017-04-18 LAB — EXPECTORATED SPUTUM ASSESSMENT W GRAM STAIN, RFLX TO RESP C

## 2017-04-18 MED ORDER — POLYETHYLENE GLYCOL 3350 17 G PO PACK
17.0000 g | PACK | Freq: Every day | ORAL | Status: DC
  Administered 2017-04-18 – 2017-04-23 (×6): 17 g via ORAL
  Filled 2017-04-18 (×6): qty 1

## 2017-04-18 NOTE — Progress Notes (Signed)
Progress Note    [name]  ZOX:[id number] DOB: 1922-10-13  DOA: 04/15/2017 PCP: Jodi Marble, MD    Brief Narrative:   Chief complaint: Follow-up multifocal pneumonia  Medical records reviewed and are as summarized below:  [name] is an 83 y.o. female with a PMH of tachybradycardia syndrome status post pacemaker placement, permanent atrial fibrillation, bioprosthetic aortic valve replacement, history of AAA repair and diastolic CHF who was admitted 04/15/17 with a chief complaint of shortness of breath and hemoptysis. She initially required BiPAP. CT of the chest showed multifocal pneumonia/aspiration with necrosis.  Assessment/Plan:   Principal Problem:   Acute respiratory failure with hypoxia (HCC) Secondary to multifocal aspiration pneumonia complicated by necrosis/dysphasia BiPAP weaned. Evaluated by speech therapy. Speech therapy evaluation showed delayed initiation with tight UES/decreased laryngeal closure. Recommendations were for regular solids, thin liquids and aspiration precautions. Continue empiric clindamycin. Follow-up sputum cultures.  Active Problems:   Hemoptysis Likely secondary to multifocal pneumonia in the setting of chronic anticoagulation.    ATRIAL FIBRILLATION INR 2.07. Heart rate controlled, 60s-80s. Coumadin remains on hold.    PVD Stable.     Pulmonary hypertension (HCC) PA peak pressure 46 mmHg on echocardiogram done 05/11/16.    S/P AAA repair/distal aortic arch aneurysm Continue blood pressure control. 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 The mid descending thoracic aorta measures 4.9 cm and is stable. Distal descending thoracic aorta measures 4.3 cm and is stable.      History of Tachy-brady syndrome ([place]) status post pacemaker Heart rate controlled.    Chronic diastolic CHF (congestive heart failure) (HCC) EF 55-60 percent on Echo done 05/11/16. Clinically compensated.    Rheumatoid arthritis ([place]) Pain management  as needed.    Hyperglycemia Glucose is 178, 241 on BMET.    Pancytopenia Platelets and hemoglobin persistently low. Leukopenia resolved. 5 g drop in hemoglobin over admission values noted. Hemoglobin stable 24 hours.   Family Communication/Anticipated D/C date and plan/Code Status   DVT prophylaxis: Resume Coumadin. Code Status: DO NOT RESUSCITATE.  Family Communication: Daughter at the bedside. Disposition Plan: Home when hemoptysis resolved.   Medical Consultants:    None.   Procedures:    None  Anti-Infectives:    Clindamycin 04/17/17--->  Flagyl 04/15/17---> 04/17/17  Levaquin 04/15/17---> 04/17/17  Rocephin 04/15/17---> 04/15/17   Subjective:   Still coughing up blood.  Reports some shortness of breath.  No chest pain.  Urinary frequency noted (on Demedex).  Objective:    Vitals:   04/17/17 2020 04/18/17 0503 04/18/17 0854 04/18/17 1020  BP: (!) 138/41 (!) 110/43  (!) 153/51  Pulse: 80 61  80  Resp: 18 18    Temp: 97.9 F (36.6 C) 97.9 F (36.6 C)    TempSrc: Oral Oral    SpO2: 99% 99% 99% 98%  Weight:  69 kg (152 lb 3.2 oz)    Height:        Intake/Output Summary (Last 24 hours) at 04/18/17 1247 Last data filed at 04/18/17 0600  Gross per 24 hour  Intake              510 ml  Output              400 ml  Net              110 ml   Filed Weights   04/16/17 0013 04/17/17 0554 04/18/17 0503  Weight: 67.4 kg (148 lb 9.4 oz) 67.9 kg (149 lb 11.2  oz) 69 kg (152 lb 3.2 oz)    Exam: General exam: Elderly female. Appears calm and comfortable.  Respiratory system: Course to auscultation. Respiratory effort mildly increased. Cardiovascular system: S1 & S2 heard, RRR. No JVD,  rubs, gallops or clicks.III/VI murmur. Gastrointestinal system: Abdomen is nondistended, soft and nontender. No organomegaly or masses felt. Normal bowel sounds heard. Central nervous system: Alert and oriented. No focal neurological deficits. Extremities: No clubbing, or cyanosis.  No edema. Skin: Extensive ecchymosis to UE and LE. Psychiatry: Judgement and insight appear normal. Mood & affect bright.   Data Reviewed:   I have personally reviewed following labs and imaging studies:  Labs: Basic Metabolic Panel:  Recent Labs Lab 04/15/17 1410 04/16/17 0355  NA 135 138  K 3.9 4.0  CL 99* 104  CO2 27 25  GLUCOSE 241* 178*  BUN 23* 22*  CREATININE 1.03* 0.94  CALCIUM 9.0 8.2*   GFR Estimated Creatinine Clearance: 35.6 mL/min (by C-G formula based on SCr of 0.94 mg/dL). Liver Function Tests: No results for input(s): AST, ALT, ALKPHOS, BILITOT, PROT, ALBUMIN in the last 168 hours. No results for input(s): LIPASE, AMYLASE in the last 168 hours. No results for input(s): AMMONIA in the last 168 hours. Coagulation profile  Recent Labs Lab 04/14/17 1022 04/15/17 1410 04/17/17 0348 04/18/17 0328  INR 3.6 2.37 2.87 2.07    CBC:  Recent Labs Lab 04/15/17 1410 04/15/17 2214 04/16/17 0355 04/17/17 0348 04/18/17 0328  WBC 2.7* 7.0 9.6 5.6 4.8  NEUTROABS  --  5.7  --   --   --   HGB 13.6 11.9* 10.2* 8.9* 8.6*  HCT 42.2 36.8 31.1* 27.5* 27.0*  MCV 92.7 92.9 92.8 92.6 92.8  PLT 121* 157 115* 94* 87*   D-Dimer:  Recent Labs  04/15/17 1410  DDIMER 1.72*   Sepsis Labs:  Recent Labs Lab 04/15/17 1422 04/15/17 2214 04/16/17 0355 04/17/17 0348 04/18/17 0328  WBC  --  7.0 9.6 5.6 4.8  LATICACIDVEN 1.49  --   --   --   --     Microbiology Recent Results (from the past 240 hour(s))  Urine culture     Status: None   Collection Time: 04/15/17  3:39 PM  Result Value Ref Range Status   Specimen Description URINE, CLEAN CATCH  Final   Special Requests Normal  Final   Culture NO GROWTH  Final   Report Status 04/16/2017 FINAL  Final  Culture, sputum-assessment     Status: None (Preliminary result)   Collection Time: 04/15/17 11:24 PM  Result Value Ref Range Status   Specimen Description EXPECTORATED SPUTUM  Final   Special Requests NONE   Final   Sputum evaluation THIS SPECIMEN IS ACCEPTABLE FOR SPUTUM CULTURE  Final   Report Status PENDING  Incomplete  Culture, respiratory (NON-Expectorated)     Status: None (Preliminary result)   Collection Time: 04/15/17 11:24 PM  Result Value Ref Range Status   Specimen Description EXPECTORATED SPUTUM  Final   Special Requests NONE Reflexed from [id number]  Final   Gram Stain   Final    ABUNDANT WBC PRESENT,BOTH PMN AND MONONUCLEAR NO ORGANISMS SEEN    Culture NO GROWTH < 24 HOURS  Final   Report Status PENDING  Incomplete    Radiology: No results found.  Medications:   . busPIRone  7.5 mg Oral BID  . diclofenac sodium  4 g Topical BID  . latanoprost  1 drop Both Eyes QHS  . LORazepam  0.5  mg Oral TID  . pantoprazole  40 mg Oral Daily  . polyethylene glycol  17 g Oral Daily  . saccharomyces boulardii  250 mg Oral BID  . torsemide  20 mg Oral Q48H  . traZODone  50 mg Oral QHS   Continuous Infusions: . clindamycin (CLEOCIN) IV Stopped (04/18/17 1100)    Medical decision making is of high complexity and this patient is at high risk of deterioration, therefore this is a level 3 visit.  (> 4 problem points, >4 data points, high risk: Need 2 out of 3)    Problems/DDx Points   Self limited or minor (max 2)       1  2  Established problem, stable       1  1  Established problem, worsening       2   New problem, no additional W/U planned (max 1)       3  1  New problem, additional W/U planned        4    Data Reviewed Points   Review/order clinical lab tests       1  1  Review/order x-rays       1   Review/order tests (Echo, EKG, PFTs, etc)       1   Discussion of test results w/ performing MD       1   Independent review of image, tracing or specimen       2  2  Decision to obtain old records       1   Review and summation of old records       2  2   Level of risk Presenting prob Diagnostics Management   Minimal 1 self limited/minor Labs CXR EKG/EEG U/A U/S  Rest Gargles Bandages Dressings   Low 2 or more self limited/minor 1 stable chronic Acute uncomplicated illness Tests (PFTS) Non-CV imaging Arterial labs Biopsies of skin OTC drugs Minor surgery-no risk PT OT IVF without additives    Moderate 1 or more chronic illnesses w/ mild exac, progression or S/E from tx 2 or more stable chronic illnesses Undiagnosed new problem w/ uncertain prognosis Acute complicated injury  Stress tests Endoscopies with no risk factors Deep needle or incisional bx CV imaging without risk LP Thoracentesis Paracentesis Minor surgery w/ risks Elective major surgery w/ no risk (open, percutaneous or endoscopic) Prescription drugs Therapeutic nucl med IVF with additives Closed tx of fracture/dislocation    High Severe exac of chronic illness Acute or chronic illness/injury may pose a threat to life or bodily function (ARF) Change in neuro status    CV imaging w/ contrast and risk Cardio electophysiologic tests Endoscopies w/ risk Discography Elective major surgery Emergency major surgery Parenteral controlled substances Drug therapy req monitoring for toxicity DNR/de-escalation of care Acute illness that may pose a threat to life or bodily function   MDM Prob points Data points Risk   Straightforward    <1    <1    Min   Low complexity    2    2    Low   Moderate    3    3    Mod   High Complexity    4 or more    4 or more    High X     LOS: 3 days   Shainna Faux  [facility] Pager [phone]. If unable to reach me by pager, please call my cell phone at [phone].  *  Please refer to [email or web address], password [email or web address] to get updated schedule on who will round on this patient, as hospitalists switch teams weekly. If 7PM-7AM, please contact night-coverage at [email or web address], password [email or web address] for any overnight needs.  04/18/2017, 12:47 PM

## 2017-04-18 NOTE — Progress Notes (Signed)
Patient had a coughing spell this morning and spit up a blood clot that was a little larger than a quarter.  She says after getting into bed, she felt hot, started coughing, and coughed up a blood clot. Clot was dark red in color.

## 2017-04-18 NOTE — Progress Notes (Signed)
Patient is progressing well, stable throughout the night. No complaints or concerns.

## 2017-04-18 NOTE — Plan of Care (Signed)
Problem: Cardiac: Goal: Ability to achieve and maintain adequate cardiopulmonary perfusion will improve Outcome: Progressing Able to wean oxygen down to 1 liter; did not attempt to take oxygen off completely due to patients anxiety

## 2017-04-19 LAB — BASIC METABOLIC PANEL
Anion gap: 9 (ref 5–15)
BUN: 16 mg/dL (ref 6–20)
CHLORIDE: 100 mmol/L — AB (ref 101–111)
CO2: 32 mmol/L (ref 22–32)
CREATININE: 0.85 mg/dL (ref 0.44–1.00)
Calcium: 8.1 mg/dL — ABNORMAL LOW (ref 8.9–10.3)
GFR calc non Af Amer: 58 mL/min — ABNORMAL LOW (ref >60)
Glucose, Bld: 122 mg/dL — ABNORMAL HIGH (ref 65–99)
Potassium: 3.5 mmol/L (ref 3.5–5.1)
Sodium: 141 mmol/L (ref 135–145)

## 2017-04-19 LAB — CBC
HEMATOCRIT: 28.5 % — AB (ref 36.0–46.0)
HEMOGLOBIN: 9.2 g/dL — AB (ref 12.0–15.0)
MCH: 29.8 pg (ref 26.0–34.0)
MCHC: 32.3 g/dL (ref 30.0–36.0)
MCV: 92.2 fL (ref 78.0–100.0)
Platelets: 111 10*3/uL — ABNORMAL LOW (ref 150–400)
RBC: 3.09 MIL/uL — ABNORMAL LOW (ref 3.87–5.11)
RDW: 13 % (ref 11.5–15.5)
WBC: 5.7 10*3/uL (ref 4.0–10.5)

## 2017-04-19 LAB — PROTIME-INR
INR: 1.62
Prothrombin Time: 19.4 seconds — ABNORMAL HIGH (ref 11.4–15.2)

## 2017-04-19 MED ORDER — IPRATROPIUM BROMIDE 0.02 % IN SOLN: 0.5000 mg | RESPIRATORY_TRACT | Status: DC | PRN

## 2017-04-19 MED ORDER — IPRATROPIUM-ALBUTEROL 0.5-2.5 (3) MG/3ML IN SOLN
3.0000 mL | Freq: Three times a day (TID) | RESPIRATORY_TRACT | Status: DC
  Administered 2017-04-19 – 2017-04-23 (×11): 3 mL via RESPIRATORY_TRACT
  Filled 2017-04-19 (×11): qty 3

## 2017-04-19 MED ORDER — IPRATROPIUM BROMIDE 0.02 % IN SOLN
0.5000 mg | Freq: Four times a day (QID) | RESPIRATORY_TRACT | Status: DC
  Administered 2017-04-19: 0.5 mg via RESPIRATORY_TRACT
  Filled 2017-04-19: qty 2.5

## 2017-04-19 MED ORDER — ENSURE ENLIVE PO LIQD
237.0000 mL | Freq: Two times a day (BID) | ORAL | Status: DC
  Administered 2017-04-19 – 2017-04-21 (×5): 237 mL via ORAL

## 2017-04-19 MED ORDER — POTASSIUM CHLORIDE CRYS ER 10 MEQ PO TBCR
10.0000 meq | EXTENDED_RELEASE_TABLET | Freq: Two times a day (BID) | ORAL | Status: DC
  Administered 2017-04-19 – 2017-04-23 (×9): 10 meq via ORAL
  Filled 2017-04-19 (×9): qty 1

## 2017-04-19 MED ORDER — NYSTATIN 100000 UNIT/ML MT SUSP
5.0000 mL | Freq: Four times a day (QID) | OROMUCOSAL | Status: DC
  Administered 2017-04-19 – 2017-04-23 (×14): 500000 [IU] via ORAL
  Filled 2017-04-19 (×14): qty 5

## 2017-04-19 NOTE — Progress Notes (Signed)
Progress Note    [name]  ZCH:[id number] DOB: 01-27-1922  DOA: 04/15/2017 PCP: Jodi Marble, MD    Brief Narrative:   Chief complaint: Follow-up multifocal pneumonia  Medical records reviewed and are as summarized below:  [name] is an 83 y.o. female with a PMH of tachybradycardia syndrome status post pacemaker placement, permanent atrial fibrillation, bioprosthetic aortic valve replacement, history of AAA repair and diastolic CHF who was admitted 04/15/17 with a chief complaint of shortness of breath and hemoptysis. She initially required BiPAP. CT of the chest showed multifocal pneumonia/aspiration with necrosis.  Assessment/Plan:   Principal Problem:   Acute respiratory failure with hypoxia (HCC) Secondary to multifocal aspiration pneumonia complicated by necrosis/dysphasia BiPAP weaned. Evaluated by speech therapy. Speech therapy evaluation showed delayed initiation with tight UES/decreased laryngeal closure. Recommendations were for regular solids, thin liquids and aspiration precautions. Continue empiric clindamycin. Sputum culture negative to date, but final result pending. Atrovent nebs initiated for wheezing (allergic to albuterol).  Active Problems:   Hemoptysis Likely secondary to multifocal pneumonia in the setting of chronic anticoagulation. Continues to have episodes of hemoptysis.    ATRIAL FIBRILLATION INR down to 1.62. Heart rate 58-80. Continue to hold Coumadin.    PVD Stable.     Pulmonary hypertension (HCC) PA peak pressure 46 mmHg on echocardiogram done 05/11/16.    S/P AAA repair/distal aortic arch aneurysm Continue blood pressure control. 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 The mid descending thoracic aorta measures 4.9 cm and is stable. Distal descending thoracic aorta measures 4.3 cm and is stable.      History of Tachy-brady syndrome ([place]) status post pacemaker Heart rate remains well controlled.    Chronic diastolic CHF  (congestive heart failure) (HCC) EF 55-60 percent on Echo done 05/11/16. Remains clinically compensated. I/O balance -1.4 L, On torsemide 20 mg every other day. Renal function improved.    Rheumatoid arthritis ([place]) Pain management as needed.    Hyperglycemia Glucose is 178, 241 on BMET. Check hemoglobin A1c.    Pancytopenia Platelets and hemoglobin persistently low. Leukopenia resolved. 5 g drop in hemoglobin over admission values noted. Hemoglobin stable  48 hours, and beginning to trend upward.   Family Communication/Anticipated D/C date and plan/Code Status   DVT prophylaxis: SCDs. Code Status: DO NOT RESUSCITATE.  Family Communication: Daughter at the bedside. Disposition Plan: Home when hemoptysis resolved.   Medical Consultants:    None.   Procedures:    None  Anti-Infectives:    Clindamycin 04/17/17--->  Flagyl 04/15/17---> 04/17/17  Levaquin 04/15/17---> 04/17/17  Rocephin 04/15/17---> 04/15/17   Subjective:   Nursing staff reports that the patient continues to cough up blood. No complaints of dyspnea.  Denies pain.  Poor appetite. No BM since admission.    Objective:    Vitals:   04/18/17 1200 04/18/17 1942 04/19/17 0456 04/19/17 1202  BP: (!) 119/48 (!) 141/49 (!) 118/37   Pulse: 76 78 (!) 58   Resp: 18 18 18    Temp: 97.6 F (36.4 C) 97.8 F (36.6 C) 98 F (36.7 C)   TempSrc: Oral Oral Oral   SpO2: 98% 94% 96% 97%  Weight:   67.7 kg (149 lb 3.2 oz)   Height:        Intake/Output Summary (Last 24 hours) at 04/19/17 1402 Last data filed at 04/19/17 0615  Gross per 24 hour  Intake              460 ml  Output  1100 ml  Net             -640 ml   Filed Weights   04/17/17 0554 04/18/17 0503 04/19/17 0456  Weight: 67.9 kg (149 lb 11.2 oz) 69 kg (152 lb 3.2 oz) 67.7 kg (149 lb 3.2 oz)    Exam: General exam: Elderly female. Sleepy but awakens to voice. Respiratory system: High pitched wheezes, diminished otherwise. Respiratory effort  mildly increased. Cardiovascular system: S1 & S2 heard, RRR. No JVD,  rubs, gallops or clicks.III/VI murmur. Gastrointestinal system: Abdomen is nondistended, soft and nontender. No organomegaly or masses felt. Normal bowel sounds heard. Central nervous system: Alert and oriented. No focal neurological deficits. Extremities: No clubbing, or cyanosis. No edema. Skin: Extensive ecchymosis to UE and LE. Psychiatry: Judgement and insight appear normal. Mood & affect bright.   Data Reviewed:   I have personally reviewed following labs and imaging studies:  Labs: Basic Metabolic Panel:  Recent Labs Lab 04/15/17 1410 04/16/17 0355 04/19/17 1020  NA 135 138 141  K 3.9 4.0 3.5  CL 99* 104 100*  CO2 27 25 32  GLUCOSE 241* 178* 122*  BUN 23* 22* 16  CREATININE 1.03* 0.94 0.85  CALCIUM 9.0 8.2* 8.1*   GFR Estimated Creatinine Clearance: 39 mL/min (by C-G formula based on SCr of 0.85 mg/dL). Liver Function Tests: No results for input(s): AST, ALT, ALKPHOS, BILITOT, PROT, ALBUMIN in the last 168 hours. No results for input(s): LIPASE, AMYLASE in the last 168 hours. No results for input(s): AMMONIA in the last 168 hours. Coagulation profile  Recent Labs Lab 04/14/17 1022 04/15/17 1410 04/17/17 0348 04/18/17 0328 04/19/17 0301  INR 3.6 2.37 2.87 2.07 1.62    CBC:  Recent Labs Lab 04/15/17 2214 04/16/17 0355 04/17/17 0348 04/18/17 0328 04/19/17 0301  WBC 7.0 9.6 5.6 4.8 5.7  NEUTROABS 5.7  --   --   --   --   HGB 11.9* 10.2* 8.9* 8.6* 9.2*  HCT 36.8 31.1* 27.5* 27.0* 28.5*  MCV 92.9 92.8 92.6 92.8 92.2  PLT 157 115* 94* 87* 111*   D-Dimer: No results for input(s): DDIMER in the last 72 hours. Sepsis Labs:  Recent Labs Lab 04/15/17 1422  04/16/17 0355 04/17/17 0348 04/18/17 0328 04/19/17 0301  WBC  --   < > 9.6 5.6 4.8 5.7  LATICACIDVEN 1.49  --   --   --   --   --   < > = values in this interval not displayed.  Microbiology Recent Results (from the past  240 hour(s))  Urine culture     Status: None   Collection Time: 04/15/17  3:39 PM  Result Value Ref Range Status   Specimen Description URINE, CLEAN CATCH  Final   Special Requests Normal  Final   Culture NO GROWTH  Final   Report Status 04/16/2017 FINAL  Final  Culture, sputum-assessment     Status: None   Collection Time: 04/15/17 11:24 PM  Result Value Ref Range Status   Specimen Description EXPECTORATED SPUTUM  Final   Special Requests NONE  Final   Sputum evaluation THIS SPECIMEN IS ACCEPTABLE FOR SPUTUM CULTURE  Final   Report Status 04/18/2017 FINAL  Final  Culture, respiratory (NON-Expectorated)     Status: None (Preliminary result)   Collection Time: 04/15/17 11:24 PM  Result Value Ref Range Status   Specimen Description EXPECTORATED SPUTUM  Final   Special Requests NONE Reflexed from [id number]  Final   Gram Stain   Final  ABUNDANT WBC PRESENT,BOTH PMN AND MONONUCLEAR NO ORGANISMS SEEN    Culture CULTURE REINCUBATED FOR BETTER GROWTH  Final   Report Status PENDING  Incomplete    Radiology: No results found.  Medications:   . busPIRone  7.5 mg Oral BID  . diclofenac sodium  4 g Topical BID  . ipratropium-albuterol  3 mL Nebulization TID  . latanoprost  1 drop Both Eyes QHS  . LORazepam  0.5 mg Oral TID  . pantoprazole  40 mg Oral Daily  . polyethylene glycol  17 g Oral Daily  . saccharomyces boulardii  250 mg Oral BID  . torsemide  20 mg Oral Q48H  . traZODone  50 mg Oral QHS   Continuous Infusions: . clindamycin (CLEOCIN) IV Stopped (04/19/17 1058)    Medical decision making is of high complexity and this patient is at high risk of deterioration, therefore this is a level 3 visit.  (> 4 problem points, 1 data points, high risk: Need 2 out of 3)    Problems/DDx Points   Self limited or minor (max 2)       1  2  Established problem, stable (hemoptysis, afib, pulm htn, CHF)       1  4  Established problem, worsening       2   New problem, no additional W/U  planned (max 1)       3  1  New problem, additional W/U planned (hyperglycemia)       4  4   Data Reviewed Points   Review/order clinical lab tests       1  1  Review/order x-rays       1   Review/order tests (Echo, EKG, PFTs, etc)       1   Discussion of test results w/ performing MD       1   Independent review of image, tracing or specimen       2    Decision to obtain old records       1   Review and summation of old records       2     Level of risk Presenting prob Diagnostics Management   Minimal 1 self limited/minor Labs CXR EKG/EEG U/A U/S Rest Gargles Bandages Dressings   Low 2 or more self limited/minor 1 stable chronic Acute uncomplicated illness Tests (PFTS) Non-CV imaging Arterial labs Biopsies of skin OTC drugs Minor surgery-no risk PT OT IVF without additives    Moderate 1 or more chronic illnesses w/ mild exac, progression or S/E from tx 2 or more stable chronic illnesses Undiagnosed new problem w/ uncertain prognosis Acute complicated injury  Stress tests Endoscopies with no risk factors Deep needle or incisional bx CV imaging without risk LP Thoracentesis Paracentesis Minor surgery w/ risks Elective major surgery w/ no risk (open, percutaneous or endoscopic) Prescription drugs Therapeutic nucl med IVF with additives Closed tx of fracture/dislocation    High Severe exac of chronic illness Acute or chronic illness/injury may pose a threat to life or bodily function (ARF) Change in neuro status    CV imaging w/ contrast and risk Cardio electophysiologic tests Endoscopies w/ risk Discography Elective major surgery Emergency major surgery Parenteral controlled substances Drug therapy req monitoring for toxicity DNR/de-escalation of care Acute illness that may pose a threat to life or bodily function   MDM Prob points Data points Risk   Straightforward    <1    <1    Min  Low complexity    2    2    Low   Moderate    3    3    Mod   High  Complexity    4 or more    4 or more    High X     LOS: 4 days   Zamarah Ullmer  [facility] Pager [phone]. If unable to reach me by pager, please call my cell phone at [phone].  *Please refer to [email or web address], password [email or web address] to get updated schedule on who will round on this patient, as hospitalists switch teams weekly. If 7PM-7AM, please contact night-coverage at [email or web address], password [email or web address] for any overnight needs.  04/19/2017, 2:02 PM

## 2017-04-19 NOTE — Progress Notes (Signed)
Report received from [place], [place]. Pt stated she spit up dark blood in a napkin left at the bedside.

## 2017-04-19 NOTE — Progress Notes (Signed)
Patient slept well last night, no episodes of coughing up blood.

## 2017-04-19 NOTE — Progress Notes (Addendum)
Pt is alert and oriented with sore throat and throat new orders placed for mouth wash, with knee pain gave Prn which was effective. Pure wick in place and patient repositioned.

## 2017-04-20 DIAGNOSIS — R1314 Dysphagia, pharyngoesophageal phase: Secondary | ICD-10-CM

## 2017-04-20 DIAGNOSIS — R1310 Dysphagia, unspecified: Secondary | ICD-10-CM

## 2017-04-20 LAB — CBC
HEMATOCRIT: 28.6 % — AB (ref 36.0–46.0)
HEMOGLOBIN: 9.1 g/dL — AB (ref 12.0–15.0)
MCH: 29.8 pg (ref 26.0–34.0)
MCHC: 31.8 g/dL (ref 30.0–36.0)
MCV: 93.8 fL (ref 78.0–100.0)
Platelets: 132 10*3/uL — ABNORMAL LOW (ref 150–400)
RBC: 3.05 MIL/uL — ABNORMAL LOW (ref 3.87–5.11)
RDW: 13.4 % (ref 11.5–15.5)
WBC: 5 10*3/uL (ref 4.0–10.5)

## 2017-04-20 LAB — EXPECTORATED SPUTUM ASSESSMENT W GRAM STAIN, RFLX TO RESP C

## 2017-04-20 LAB — HEMOGLOBIN A1C
HEMOGLOBIN A1C: 5.5 % (ref 4.8–5.6)
Mean Plasma Glucose: 111 mg/dL

## 2017-04-20 LAB — BASIC METABOLIC PANEL
Anion gap: 5 (ref 5–15)
BUN: 23 mg/dL — ABNORMAL HIGH (ref 6–20)
CALCIUM: 8 mg/dL — AB (ref 8.9–10.3)
CHLORIDE: 98 mmol/L — AB (ref 101–111)
CO2: 36 mmol/L — AB (ref 22–32)
CREATININE: 0.78 mg/dL (ref 0.44–1.00)
GFR calc Af Amer: >60 mL/min (ref >60)
GFR calc non Af Amer: >60 mL/min (ref >60)
GLUCOSE: 116 mg/dL — AB (ref 65–99)
Potassium: 3.6 mmol/L (ref 3.5–5.1)
Sodium: 139 mmol/L (ref 135–145)

## 2017-04-20 LAB — PROTIME-INR
INR: 1.44
Prothrombin Time: 17.7 seconds — ABNORMAL HIGH (ref 11.4–15.2)

## 2017-04-20 LAB — CULTURE, RESPIRATORY W GRAM STAIN: Culture: NORMAL

## 2017-04-20 LAB — CULTURE, RESPIRATORY

## 2017-04-20 MED ORDER — OXYMETAZOLINE HCL 0.05 % NA SOLN: 1.0000 | Freq: Two times a day (BID) | NASAL | Status: DC

## 2017-04-20 MED ORDER — OXYMETAZOLINE HCL 0.05 % NA SOLN
1.0000 | Freq: Two times a day (BID) | NASAL | Status: AC
  Administered 2017-04-20 – 2017-04-21 (×4): 1 via NASAL
  Filled 2017-04-20: qty 15

## 2017-04-20 MED ORDER — WARFARIN SODIUM 5 MG PO TABS
5.0000 mg | ORAL_TABLET | Freq: Once | ORAL | Status: AC
  Administered 2017-04-20: 5 mg via ORAL
  Filled 2017-04-20: qty 1

## 2017-04-20 MED ORDER — WARFARIN - PHARMACIST DOSING INPATIENT: Freq: Every day | Status: DC

## 2017-04-20 NOTE — Progress Notes (Signed)
Progress Note    [name]  BOF:[id number] DOB: 25-Jun-1922  DOA: 04/15/2017 PCP: Jodi Marble, MD    Brief Narrative:   Chief complaint: Follow-up multifocal pneumonia  Medical records reviewed and are as summarized below:  [name] is an 83 y.o. female with a PMH of tachybradycardia syndrome status post pacemaker placement, permanent atrial fibrillation, bioprosthetic aortic valve replacement, history of AAA repair and diastolic CHF who was admitted 04/15/17 with a chief complaint of shortness of breath and hemoptysis. She initially required BiPAP. CT of the chest showed multifocal pneumonia/aspiration with necrosis.  Assessment/Plan:   Principal Problem:   Acute respiratory failure with hypoxia (HCC) Secondary to multifocal aspiration pneumonia complicated by necrosis/ in the setting of pharyngoesophageal phase dysphasia BiPAP weaned. Evaluated by speech therapy. Speech therapy evaluation showed delayed initiation with tight UES/decreased laryngeal closure. Recommendations were for regular solids, thin liquids and aspiration precautions. Continue empiric clindamycin. Sputum culture re-incubated. Continue Atrovent nebs (allergic to albuterol).  Active Problems:   Epistaxis Afrin every 12 hours 48 hours.    Thrush Continue nystatin.    Hemoptysis Likely secondary to multifocal pneumonia in the setting of chronic anticoagulation. No hemoptysis x 24 hours, hemoglobin stable.    ATRIAL FIBRILLATION INR down to 1.44. Heart rate 58-76. Resume Coumadin.  Case discussed with Dr. Caryl Comes.    PVD Stable.     Pulmonary hypertension (HCC) PA peak pressure 46 mmHg on echocardiogram done 05/11/16.    S/P AAA repair/distal aortic arch aneurysm Continue blood pressure control. 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 The mid descending thoracic aorta measures 4.9 cm and is stable. Distal descending thoracic aorta measures 4.3 cm and is stable.      History of  Tachy-brady syndrome ([place]) status post pacemaker Heart rate remains well controlled.    Chronic diastolic CHF (congestive heart failure) (HCC) EF 55-60 percent on Echo done 05/11/16. Remains clinically compensated. I/O balance -300 cc, On torsemide 20 mg every other day. Renal function continues to improve.    Rheumatoid arthritis ([place]) Pain management as needed.    Hyperglycemia Glucose is 178, 241, 122, 116 on BMET. Hemoglobin A1c 5.5%.    Pancytopenia Platelets and hemoglobin persistently low. Leukopenia resolved. 5 g drop in hemoglobin over admission values noted. Hemoglobin stable  72 hours.   Family Communication/Anticipated D/C date and plan/Code Status   DVT prophylaxis: SCDs. Code Status: DO NOT RESUSCITATE.  Family Communication: Niece at the bedside. Disposition Plan: Home when hemoptysis resolved and sputum cultures back.   Medical Consultants:    None.   Procedures:    None  Anti-Infectives:    Clindamycin 04/17/17--->  Flagyl 04/15/17---> 04/17/17  Levaquin 04/15/17---> 04/17/17  Rocephin 04/15/17---> 04/15/17   Subjective:   The patient has not coughed up any blood in the last 24 hours. Had a minor nosebleed. Also complains of difficulty swallowing and a bad taste in her mouth. Found to have thrush. Symptoms improving on nystatin, which was started last evening.   Objective:    Vitals:   04/19/17 1944 04/19/17 2005 04/20/17 0452 04/20/17 0848  BP:  (!) 124/43 (!) 122/43   Pulse: 76 76 (!) 58   Resp: 18 18 18    Temp:  98 F (36.7 C) 98.2 F (36.8 C)   TempSrc:  Oral Oral   SpO2: 98% 98% 98% 99%  Weight:   67.4 kg (148 lb 8 oz)   Height:        Intake/Output Summary (Last  24 hours) at 04/20/17 0949 Last data filed at 04/20/17 0533  Gross per 24 hour  Intake              150 ml  Output              450 ml  Net             -300 ml   Filed Weights   04/18/17 0503 04/19/17 0456 04/20/17 0452  Weight: 69 kg (152 lb 3.2 oz) 67.7 kg (149 lb 3.2  oz) 67.4 kg (148 lb 8 oz)    Exam:   General exam: Sitting up in bed, eating breakfast. Awake and alert. Respiratory system: Decreased breath sounds but clear today. Cardiovascular system: Regular rate, and rhythm. No murmurs, rubs, or gallops. Grade 3/6 murmur.   Gastrointestinal system: Soft, nontender, nondistended with normal active bowel sounds. No masses.  Central nervous system: Alert and oriented. No focal neurological deficits.. Extremities: No clubbing, cyanosis, or edema. Thick skin with venous stasis changes to the lower extremities. Skin: Venous stasis changes and scattered ecchymosis. Psychiatry: . Insight/judgment good. Affect bright.  Data Reviewed:   I have personally reviewed following labs and imaging studies:  Labs: Basic Metabolic Panel:  Recent Labs Lab 04/15/17 1410 04/16/17 0355 04/19/17 1020 04/20/17 0235  NA 135 138 141 139  K 3.9 4.0 3.5 3.6  CL 99* 104 100* 98*  CO2 27 25 32 36*  GLUCOSE 241* 178* 122* 116*  BUN 23* 22* 16 23*  CREATININE 1.03* 0.94 0.85 0.78  CALCIUM 9.0 8.2* 8.1* 8.0*   GFR Estimated Creatinine Clearance: 41.4 mL/min (by C-G formula based on SCr of 0.78 mg/dL). Liver Function Tests: No results for input(s): AST, ALT, ALKPHOS, BILITOT, PROT, ALBUMIN in the last 168 hours. No results for input(s): LIPASE, AMYLASE in the last 168 hours. No results for input(s): AMMONIA in the last 168 hours. Coagulation profile  Recent Labs Lab 04/15/17 1410 04/17/17 0348 04/18/17 0328 04/19/17 0301 04/20/17 0235  INR 2.37 2.87 2.07 1.62 1.44    CBC:  Recent Labs Lab 04/15/17 2214 04/16/17 0355 04/17/17 0348 04/18/17 0328 04/19/17 0301 04/20/17 0235  WBC 7.0 9.6 5.6 4.8 5.7 5.0  NEUTROABS 5.7  --   --   --   --   --   HGB 11.9* 10.2* 8.9* 8.6* 9.2* 9.1*  HCT 36.8 31.1* 27.5* 27.0* 28.5* 28.6*  MCV 92.9 92.8 92.6 92.8 92.2 93.8  PLT 157 115* 94* 87* 111* 132*   D-Dimer: No results for input(s): DDIMER in the last 72  hours. Sepsis Labs:  Recent Labs Lab 04/15/17 1422  04/17/17 0348 04/18/17 0328 04/19/17 0301 04/20/17 0235  WBC  --   < > 5.6 4.8 5.7 5.0  LATICACIDVEN 1.49  --   --   --   --   --   < > = values in this interval not displayed.  Microbiology Recent Results (from the past 240 hour(s))  Urine culture     Status: None   Collection Time: 04/15/17  3:39 PM  Result Value Ref Range Status   Specimen Description URINE, CLEAN CATCH  Final   Special Requests Normal  Final   Culture NO GROWTH  Final   Report Status 04/16/2017 FINAL  Final  Culture, sputum-assessment     Status: None   Collection Time: 04/15/17 11:24 PM  Result Value Ref Range Status   Specimen Description EXPECTORATED SPUTUM  Final   Special Requests NONE  Final  Sputum evaluation THIS SPECIMEN IS ACCEPTABLE FOR SPUTUM CULTURE  Final   Report Status 04/18/2017 FINAL  Final  Culture, respiratory (NON-Expectorated)     Status: None (Preliminary result)   Collection Time: 04/15/17 11:24 PM  Result Value Ref Range Status   Specimen Description EXPECTORATED SPUTUM  Final   Special Requests NONE Reflexed from [id number]  Final   Gram Stain   Final    ABUNDANT WBC PRESENT,BOTH PMN AND MONONUCLEAR NO ORGANISMS SEEN    Culture CULTURE REINCUBATED FOR BETTER GROWTH  Final   Report Status PENDING  Incomplete    Radiology: No results found.  Medications:   . busPIRone  7.5 mg Oral BID  . diclofenac sodium  4 g Topical BID  . feeding supplement (ENSURE ENLIVE)  237 mL Oral BID BM  . ipratropium-albuterol  3 mL Nebulization TID  . latanoprost  1 drop Both Eyes QHS  . LORazepam  0.5 mg Oral TID  . nystatin  5 mL Oral QID  . pantoprazole  40 mg Oral Daily  . polyethylene glycol  17 g Oral Daily  . potassium chloride  10 mEq Oral BID  . saccharomyces boulardii  250 mg Oral BID  . torsemide  20 mg Oral Q48H  . traZODone  50 mg Oral QHS   Continuous Infusions: . clindamycin (CLEOCIN) IV Stopped (04/20/17 0327)     Medical decision making is of high complexity and this patient is at [facility] risk of deterioration, therefore this is a level 2 visit.  (> 4 problem points, 1 data points, Mod risk: Need 2 out of 3)    Problems/DDx Points   Self limited or minor (max 2)       1  2  Established problem, stable (hemoptysis, afib, pulm htn, CHF)       1  4  Established problem, worsening       2   New problem, no additional W/U planned (max 1)       3  1  New problem, additional W/U planned (hyperglycemia)       4  4   Data Reviewed Points   Review/order clinical lab tests       1  1  Review/order x-rays       1   Review/order tests (Echo, EKG, PFTs, etc)       1   Discussion of test results w/ performing MD       1   Independent review of image, tracing or specimen       2    Decision to obtain old records       1   Review and summation of old records       2     Level of risk Presenting prob Diagnostics Management   Minimal 1 self limited/minor Labs CXR EKG/EEG U/A U/S Rest Gargles Bandages Dressings   Low 2 or more self limited/minor 1 stable chronic Acute uncomplicated illness Tests (PFTS) Non-CV imaging Arterial labs Biopsies of skin OTC drugs Minor surgery-no risk PT OT IVF without additives    Moderate 1 or more chronic illnesses w/ mild exac, progression or S/E from tx 2 or more stable chronic illnesses Undiagnosed new problem w/ uncertain prognosis Acute complicated injury  Stress tests Endoscopies with no risk factors Deep needle or incisional bx CV imaging without risk LP Thoracentesis Paracentesis Minor surgery w/ risks Elective major surgery w/ no risk (open, percutaneous or endoscopic) Prescription drugs Therapeutic nucl med IVF with  additives Closed tx of fracture/dislocation    High Severe exac of chronic illness Acute or chronic illness/injury may pose a threat to life or bodily function (ARF) Change in neuro status    CV imaging w/ contrast and  risk Cardio electophysiologic tests Endoscopies w/ risk Discography Elective major surgery Emergency major surgery Parenteral controlled substances Drug therapy req monitoring for toxicity DNR/de-escalation of care Acute illness that may pose a threat to life or bodily function   MDM Prob points Data points Risk   Straightforward    <1    <1    Min   Low complexity    2    2    Low   Moderate    3    3    Mod X  High Complexity    4 or more    4 or more    High      LOS: 5 days   RAMA,CHRISTINA  [facility] Pager [phone]. If unable to reach me by pager, please call my cell phone at [phone].  *Please refer to [email or web address], password [email or web address] to get updated schedule on who will round on this patient, as hospitalists switch teams weekly. If 7PM-7AM, please contact night-coverage at [email or web address], password [email or web address] for any overnight needs.  04/20/2017, 9:49 AM

## 2017-04-20 NOTE — Progress Notes (Signed)
SLP Cancellation Note  Patient Details Name: [name] MRN: [id number] DOB: 1922/02/08   Cancelled treatment:        Patient in with MD this morning and PTA is waiting to work with her.  Hopeful to follow up later today as time allows for me to return.   Gunnar Fusi, M.A., [place]  [place] 04/20/2017, 10:00 AM

## 2017-04-20 NOTE — Progress Notes (Addendum)
  Speech Language Pathology Treatment: Dysphagia  Patient Details Name: [name] MRN: [id number] DOB: Aug 20, 1922 Today's Date: 04/20/2017 Time: 1657-9038 SLP Time Calculation (min) (ACUTE ONLY): 16 min  Assessment / Plan / Recommendation Clinical Impression  Skilled treatment session focused on addressing dysphagia goals. Patient independently able to recall need to take a sip after 3-4 bites and SLP facilitated session by providing Min assist question cues to recall use of intermittent throat clears.  SLP also facilitated session with skilled observation of patient consuming soft solids (due to pain from thrush she declined regular textures) and thin liquids via cup with no overt s/s of aspiration and Mod I use of safe swallow strategies following initial discussion.  Family member present and reports that PO intake has improved since thrush started being treated yesterday.  She verbalized understanding of information.  Goals met, education complete, and no further skilled SLP services are warranted at this time.      HPI HPI: 83 y.o.femalewith history of tachybradycardia syndrome status post pacemaker placement, permanent atrial fibrillation, bioprosthetic aortic valve replacement, abdominal aortic aneurysm status post repair, diastolic CHF, hiatal hernia,  was brought to the ER after patient was having shortness of breath with hemoptysis. Patient had gone yesterday for CAT scan of the abdomen and pelvis since patient was having mid back pain radiating to the front for last few weeks, developed cough/hemoptysis afterwards. Initially placed in [place]. CT showed multifocal PNA with possible aspiration and necrosis. Patient seen by SLP 05/12/16 with coughing on thin liquids at bedside thought due to oral candidasis, placed on nectar thick liquid with planned upgrade at bedside.       SLP Plan  All goals met       Recommendations  Diet recommendations: Regular;Thin liquid Liquids provided  via: Cup;Straw Medication Administration: Whole meds with liquid Supervision: Patient able to self feed;Intermittent supervision to cue for compensatory strategies Compensations: Slow rate;Small sips/bites;Clear throat intermittently;Other (Comment) (liquid sip every 3-4 bites) Postural Changes and/or Swallow Maneuvers: Seated upright 90 degrees                Oral Care Recommendations: Oral care BID Follow up Recommendations: None SLP Visit Diagnosis: Dysphagia, pharyngoesophageal phase (R13.14) Plan: All goals met       GO               Carmelia Roller., CCC-SLP [phone]   [name] 04/20/2017, 11:56 AM

## 2017-04-20 NOTE — Progress Notes (Signed)
Physical Therapy Treatment Patient Details Name: [name] MRN: [id number] DOB: 1922-07-28 Today's Date: 04/20/2017    History of Present Illness 83 yo admitted with resp failure with PNA. PMHx: tachybrady syndrome, pacemaker, AAA repair, AFib, AVR, CHF, PVD    PT Comments    Patient tolerated short distance gait this session. Limited by bilat knee pain. SpO2 <90% on RA with mobility and pt on 1.5L O2 via [place] end of session with SpO2 95%. Continue to progress as tolerated with anticipated d/c home with HHPT.   Follow Up Recommendations  Home health PT;Supervision/Assistance - 24 hour     Equipment Recommendations  Rolling walker with 5" wheels (youth)    Recommendations for Other Services       Precautions / Restrictions Precautions Precautions: Fall Precaution Comments: fragile and sensitive skin    Mobility  Bed Mobility Overal bed mobility: Needs Assistance Bed Mobility: Supine to Sit     Supine to sit: Min assist;HOB elevated     General bed mobility comments: use of bed rail; assist to bring hips toward EOB; increased time and effort due to bilat knee pain  Transfers Overall transfer level: Needs assistance Equipment used: Rolling walker (2 wheeled);1 person hand held assist Transfers: Sit to/from [facility] Sit to Stand: Min guard;Min assist Stand pivot transfers: Min assist       General transfer comment: cues for hand placement; stood from EOB and BSC X2; assist to power up into standing from EOB  Ambulation/Gait Ambulation/Gait assistance: Min guard Ambulation Distance (Feet): 20 Feet Assistive device: Rolling walker (2 wheeled) Gait Pattern/deviations: Step-through pattern;Decreased stride length;Antalgic;Trunk flexed     General Gait Details: cues for proximity of RW; difficutly with L knee flexion during swing phase; SpO2 down to 86% on RA with mobility   Stairs            Wheelchair Mobility    Modified Rankin  (Stroke Patients Only)       Balance Overall balance assessment: Needs assistance   Sitting balance-Leahy Scale: Good     Standing balance support: Bilateral upper extremity supported Standing balance-Leahy Scale: Poor                              Cognition Arousal/Alertness: Awake/alert Behavior During Therapy: WFL for tasks assessed/performed Overall Cognitive Status: Within Functional Limits for tasks assessed                                        Exercises      General Comments General comments (skin integrity, edema, etc.): SpO2 >90% on 1.5L O2 via [place] end of session      Pertinent Vitals/Pain Pain Assessment: Faces Faces Pain Scale: Hurts even more Pain Location: bilat knees (L>R) Pain Descriptors / Indicators: Grimacing;Guarding;Sore Pain Intervention(s): Limited activity within patient's tolerance;Monitored during session;Repositioned    Home Living                      Prior Function            PT Goals (current goals can now be found in the care plan section) Progress towards PT goals: Progressing toward goals    Frequency    Min 3X/week      PT Plan Current plan remains appropriate    Co-evaluation  AM-PAC PT "6 Clicks" Daily Activity  Outcome Measure  Difficulty turning over in bed (including adjusting bedclothes, sheets and blankets)?: A Little Difficulty moving from lying on back to sitting on the side of the bed? : Total Difficulty sitting down on and standing up from a chair with arms (e.g., wheelchair, bedside commode, etc,.)?: Total Help needed moving to and from a bed to chair (including a wheelchair)?: A Little Help needed walking in hospital room?: A Little Help needed climbing 3-5 steps with a railing? : Total 6 Click Score: 12    End of Session Equipment Utilized During Treatment: Oxygen;Gait belt Activity Tolerance: Patient tolerated treatment well Patient left: in  chair;with call bell/phone within reach;with chair alarm set;with family/visitor present Nurse Communication: Mobility status;Precautions PT Visit Diagnosis: Other abnormalities of gait and mobility (R26.89);Difficulty in walking, not elsewhere classified (R26.2);Muscle weakness (generalized) (M62.81)     Time: 3491-7915 PT Time Calculation (min) (ACUTE ONLY): 49 min  Charges:  $Gait Training: 8-22 mins $Therapeutic Activity: 23-37 mins                    G Codes:       Earney Navy, PTA Pager: [phone]     Darliss Cheney 04/20/2017, 11:32 AM

## 2017-04-20 NOTE — Progress Notes (Signed)
ANTICOAGULATION CONSULT NOTE - Initial Consult  Pharmacy Consult for warfarin Indication: atrial fibrillation  Allergies  Allergen Reactions  . Fluticasone-Salmeterol Anaphylaxis  . Furosemide Shortness Of Breath  . Iodine Other (See Comments)    Reaction:  Fever and chills   . Iohexol Other (See Comments)    Desc:  Pt requires a 13 hr prep, when doing exam with contrast....fever, chills, dyspnea (slg/04/27/09--given 2 hour solumedrol/ benadryl prep--no reaction).    . Sulfonamide Derivatives Nausea And Vomiting  . Verapamil Nausea And Vomiting and Other (See Comments)    Reaction:  Loss of appetite   . Macrobid [Nitrofurantoin Monohyd Macro] Nausea Only and Other (See Comments)    Severe stomach pain  . Latex Rash  . Penicillins Rash and Other (See Comments)    Pt unable to answer additional questions about this medication.   Has patient had a PCN reaction causing immediate rash, facial/tongue/throat swelling, SOB or lightheadedness with hypotension: Yes Has patient had a PCN reaction causing severe rash involving mucus membranes or skin necrosis: UnknownHas patient had a PCN reaction that required hospitalization NoHas patient had a PCN reaction occurring within the last 10 years: No If all of the above answers are "NO", then may proceed with Cephalosporin    Patient Measurements: Height: 5\' 3"  (160 cm) Weight: 148 lb 8 oz (67.4 kg) IBW/kg (Calculated) : 52.4   Vital Signs: Temp: 98.2 F (36.8 C) (06/04 0452) Temp Source: Oral (06/04 0452) BP: 122/43 (06/04 0452) Pulse Rate: 58 (06/04 0452)  Labs:  Recent Labs  04/18/17 0328 04/19/17 0301 04/19/17 1020 04/20/17 0235  HGB 8.6* 9.2*  --  9.1*  HCT 27.0* 28.5*  --  28.6*  PLT 87* 111*  --  132*  LABPROT 23.6* 19.4*  --  17.7*  INR 2.07 1.62  --  1.44  CREATININE  --   --  0.85 0.78    Estimated Creatinine Clearance: 41.4 mL/min (by C-G formula based on SCr of 0.78 mg/dL).   Medical History: Past Medical  History:  Diagnosis Date  . AAA (abdominal aortic aneurysm) ([place])    a. 12/2011 U/S: 3.3 cm prox AAA.  [name] Anxiety   . Aortic insufficiency    a. S/P bioprosthetic AVR;  b. 02/2013 Echo: EF 50-55%, mild to mod AI (mean grad 63mmHg).  . Brain aneurysm    Hx of  . Chest pain    a. H/o nonobs cath;  b. 07/2013 Lexiscan MV: EF 66%, no ischemia.  . Chronic diastolic CHF (congestive heart failure) ([place])    a. 02/2013 Echo: EF 50-55%.  . Diverticulosis   . Gastritis   . Hemorrhoids   . Hepatitis B 1980's  . Hiatal hernia   . Hypertension   . Moderate mitral regurgitation    a. 02/2013 Echo: Mod MR.  . Moderate tricuspid regurgitation    a. 02/2013 Echo: Mod TR.  [name] Permanent atrial fibrillation ([place])    a. Chronic coumadin.  . Polymyalgia rheumatica ([place])   . Pulmonary hypertension ([place])    a. 02/2013 Echo: PASP 63mmHg.  . S/P aortic valve replacement with bioprosthetic valve    a. [facility].  . S/P placement of cardiac pacemaker    a.  08/2007 s/p MDT [place] PPM.  . Seizures ([place])   . Shingles   . Skin cancer    face, ear  and left hand.  . Tachy-brady syndrome ([place])    a. Severe in the setting of permanent atrial fibrillation with  tachybrady syndrome;  b. 08/2007 s/p MDT [place] PPM.  . Thoracic aneurysm    a. 10/2011 CTA: 5.0 x 3.8 cm.    Assessment: 83 yo female with AFib, on warfarin prior to admission.  Warfarin has been held due to hemoptysis, last dose was prior to admission on 5/29. INR was therapeutic at admission, but did note recent supratherapeutic INR of 3.6 on 5/30. Current INR is 1.44. Warfarin will be resumed tonight.  PTA warfarin dose: 2.5 mg qFri, 5 mg all other days   Goal of Therapy:  INR 2-3 Monitor platelets by anticoagulation protocol: Yes    Plan:  -warfarin 5 mg po x1 -daily INR   Harvel Quale 04/20/2017,11:01 AM

## 2017-04-20 NOTE — Care Management Important Message (Signed)
Important Message  Patient Details  Name: [name] MRN: [id number] Date of Birth: 01/22/1922   Medicare Important Message Given:  Yes    [name] Montine Circle 04/20/2017, 12:02 PM

## 2017-04-21 ENCOUNTER — Inpatient Hospital Stay (HOSPITAL_COMMUNITY): Payer: Medicare Other

## 2017-04-21 DIAGNOSIS — M25562 Pain in left knee: Secondary | ICD-10-CM | POA: Diagnosis not present

## 2017-04-21 LAB — BASIC METABOLIC PANEL
ANION GAP: 11 (ref 5–15)
BUN: 19 mg/dL (ref 6–20)
CALCIUM: 8.1 mg/dL — AB (ref 8.9–10.3)
CHLORIDE: 94 mmol/L — AB (ref 101–111)
CO2: 33 mmol/L — AB (ref 22–32)
Creatinine, Ser: 0.89 mg/dL (ref 0.44–1.00)
GFR calc Af Amer: >60 mL/min (ref >60)
GFR calc non Af Amer: 55 mL/min — ABNORMAL LOW (ref >60)
GLUCOSE: 140 mg/dL — AB (ref 65–99)
POTASSIUM: 4.1 mmol/L (ref 3.5–5.1)
Sodium: 138 mmol/L (ref 135–145)

## 2017-04-21 LAB — CBC
HEMATOCRIT: 29.3 % — AB (ref 36.0–46.0)
HEMOGLOBIN: 9.2 g/dL — AB (ref 12.0–15.0)
MCH: 29.5 pg (ref 26.0–34.0)
MCHC: 31.4 g/dL (ref 30.0–36.0)
MCV: 93.9 fL (ref 78.0–100.0)
Platelets: 134 10*3/uL — ABNORMAL LOW (ref 150–400)
RBC: 3.12 MIL/uL — ABNORMAL LOW (ref 3.87–5.11)
RDW: 13.5 % (ref 11.5–15.5)
WBC: 7.1 10*3/uL (ref 4.0–10.5)

## 2017-04-21 LAB — PROTIME-INR
INR: 1.3
PROTHROMBIN TIME: 16.3 s — AB (ref 11.4–15.2)

## 2017-04-21 MED ORDER — SUCRALFATE 1 GM/10ML PO SUSP
1.0000 g | Freq: Three times a day (TID) | ORAL | Status: DC
  Administered 2017-04-21 – 2017-04-23 (×9): 1 g via ORAL
  Filled 2017-04-21 (×9): qty 10

## 2017-04-21 MED ORDER — WARFARIN SODIUM 5 MG PO TABS
5.0000 mg | ORAL_TABLET | Freq: Once | ORAL | Status: AC
  Administered 2017-04-21: 5 mg via ORAL
  Filled 2017-04-21: qty 1

## 2017-04-21 MED ORDER — CLINDAMYCIN HCL 300 MG PO CAPS
300.0000 mg | ORAL_CAPSULE | Freq: Three times a day (TID) | ORAL | Status: DC
  Administered 2017-04-21 – 2017-04-23 (×6): 300 mg via ORAL
  Filled 2017-04-21 (×6): qty 1

## 2017-04-21 MED ORDER — TORSEMIDE 20 MG PO TABS
20.0000 mg | ORAL_TABLET | Freq: Every day | ORAL | Status: DC
  Administered 2017-04-21 – 2017-04-23 (×3): 20 mg via ORAL
  Filled 2017-04-21 (×2): qty 1

## 2017-04-21 NOTE — Progress Notes (Addendum)
Progress Note    [name]  MHD:[id number] DOB: 1922-01-02  DOA: 04/15/2017 PCP: Jodi Marble, MD    Brief Narrative:   Chief complaint: Follow-up multifocal pneumonia  Medical records reviewed and are as summarized below:  [name] is an 83 y.o. female with a PMH of tachybradycardia syndrome status post pacemaker placement, permanent atrial fibrillation, bioprosthetic aortic valve replacement, history of AAA repair and diastolic CHF who was admitted 04/15/17 with a chief complaint of shortness of breath and hemoptysis. She initially required BiPAP. CT of the chest showed multifocal pneumonia/aspiration with necrosis.  Assessment/Plan:   Principal Problem:   Acute respiratory failure with hypoxia (HCC) Secondary to multifocal aspiration pneumonia complicated by necrosis/ in the setting of pharyngoesophageal phase dysphasia BiPAP weaned. Evaluated by speech therapy. Speech therapy evaluation showed delayed initiation with tight UES/decreased laryngeal closure. Recommendations were for regular solids, thin liquids and aspiration precautions. Continue empiric clindamycin. Sputum cultures obtained x 2, 2nd one re-incubated, final results still pending, but earlier sputum culture grew only normal oral pharyngeal flora. Continue Atrovent nebs (allergic to albuterol).  Active Problems:   Left knee pain with inability to bear weight (new problem, further workup planned) Patient reports that she was unable to bear weight when PT attempted to ambulate her. Complains of severe left-sided knee pain. No history of gout. Will check 2 views of the knee, as needed as appear to be swollen on exam.    Epistaxis Continue Afrin every 12 hours 48 hours, no reports of further epistaxis.    Thrush Improved. Continue nystatin, no visible thrush in mouth today.    Hemoptysis Likely secondary to multifocal pneumonia in the setting of chronic anticoagulation. Hemoglobin stable 72  hours, but patient continues to report intermittent hemoptysis.    ATRIAL FIBRILLATION Coumadin resumed 04/20/17, monitor H and H closely.      PVD Stable.     Pulmonary hypertension (HCC) PA peak pressure 46 mmHg on echocardiogram done 05/11/16.    S/P AAA repair/distal aortic arch aneurysm Continue blood pressure control. 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 The mid descending thoracic aorta measures 4.9 cm and is stable. Distal descending thoracic aorta measures 4.3 cm and is stable.       History of Tachy-brady syndrome ([place]) status post pacemaker Heart rate remains well controlled: 67-92.    Chronic diastolic CHF (congestive heart failure) (HCC) EF 55-60 percent on Echo done 05/11/16. Remains clinically compensated. I/O balance - 730 cc, On torsemide 20 mg every other day. Renal function stable at baseline.    Rheumatoid arthritis ([place]) Stable. Pain management as needed.    Hyperglycemia Glucose is 116-241 on BMET. No evidence of diabetes, Hemoglobin A1c 5.5%.    Pancytopenia Platelets and hemoglobin persistently low. Leukopenia resolved. 5 g drop in hemoglobin over admission values noted. Hemoglobin stable  72 hours.   Family Communication/Anticipated D/C date and plan/Code Status   DVT prophylaxis: SCDs. Code Status: DO NOT RESUSCITATE.  Family Communication: Niece at the bedside. Disposition Plan: Home when hemoptysis resolved and sputum cultures back.   Medical Consultants:    None.   Procedures:    None  Anti-Infectives:    Clindamycin 04/17/17--->  Flagyl 04/15/17---> 04/17/17  Levaquin 04/15/17---> 04/17/17  Rocephin 04/15/17---> 04/15/17   Subjective:   Patient reports no left-sided knee pain and inability to bear weight. She reports having had a few episodes of hemoptysis in the past 24 hours, but no further epistaxis. Swallowing better, and eating better.  Objective:    Vitals:   04/21/17 1006 04/21/17 1229 04/21/17 1344 04/21/17 1411  BP: (!)  118/37 (!) 145/47  (!) 145/50  Pulse: 74 77  75  Resp: 16 18    Temp:  98.7 F (37.1 C)    TempSrc:  Oral    SpO2: 96% 98% 98% 98%  Weight:      Height:        Intake/Output Summary (Last 24 hours) at 04/21/17 1625 Last data filed at 04/21/17 1230  Gross per 24 hour  Intake              820 ml  Output             1850 ml  Net            -1030 ml   Filed Weights   04/19/17 0456 04/20/17 0452 04/21/17 0640  Weight: 67.7 kg (149 lb 3.2 oz) 67.4 kg (148 lb 8 oz) 67 kg (147 lb 12.8 oz)    Exam:   General exam: Appears weaker today.  Oropharynx: Thrush resolved. Respiratory system: Unchanged from 04/20/17: Decreased breath sounds but clear today. Cardiovascular system: Unchanged from 04/20/17: Regular rate, and rhythm. No murmurs, rubs, or gallops. Grade 3/6 murmur.   Gastrointestinal system: Unchanged from 04/20/17: Soft, nontender, nondistended with normal active bowel sounds. No masses.  Central nervous system: Unchanged from 04/20/17: Alert and oriented. No focal neurological deficits.. Extremities: Unchanged from 04/20/17: No clubbing, cyanosis, or edema. Thick skin with venous stasis changes to the lower extremities. MSK: Left knee swollen. Skin: Unchanged from 04/20/17: Venous stasis changes and scattered ecchymosis. Psychiatry: . Unchanged from 04/20/17: Insight/judgment good. Affect bright.  Data Reviewed:   I have personally reviewed following labs and imaging studies:  Labs: Basic Metabolic Panel:  Recent Labs Lab 04/15/17 1410 04/16/17 0355 04/19/17 1020 04/20/17 0235 04/21/17 0451  NA 135 138 141 139 138  K 3.9 4.0 3.5 3.6 4.1  CL 99* 104 100* 98* 94*  CO2 27 25 32 36* 33*  GLUCOSE 241* 178* 122* 116* 140*  BUN 23* 22* 16 23* 19  CREATININE 1.03* 0.94 0.85 0.78 0.89  CALCIUM 9.0 8.2* 8.1* 8.0* 8.1*   GFR Estimated Creatinine Clearance: 37.1 mL/min (by C-G formula based on SCr of 0.89 mg/dL). Liver Function Tests: No results for input(s): AST, ALT, ALKPHOS,  BILITOT, PROT, ALBUMIN in the last 168 hours. No results for input(s): LIPASE, AMYLASE in the last 168 hours. No results for input(s): AMMONIA in the last 168 hours. Coagulation profile  Recent Labs Lab 04/17/17 0348 04/18/17 0328 04/19/17 0301 04/20/17 0235 04/21/17 0451  INR 2.87 2.07 1.62 1.44 1.30    CBC:  Recent Labs Lab 04/15/17 2214  04/17/17 0348 04/18/17 0328 04/19/17 0301 04/20/17 0235 04/21/17 0451  WBC 7.0  < > 5.6 4.8 5.7 5.0 7.1  NEUTROABS 5.7  --   --   --   --   --   --   HGB 11.9*  < > 8.9* 8.6* 9.2* 9.1* 9.2*  HCT 36.8  < > 27.5* 27.0* 28.5* 28.6* 29.3*  MCV 92.9  < > 92.6 92.8 92.2 93.8 93.9  PLT 157  < > 94* 87* 111* 132* 134*  < > = values in this interval not displayed. D-Dimer: No results for input(s): DDIMER in the last 72 hours. Sepsis Labs:  Recent Labs Lab 04/15/17 1422  04/18/17 0328 04/19/17 0301 04/20/17 0235 04/21/17 0451  WBC  --   < >  4.8 5.7 5.0 7.1  LATICACIDVEN 1.49  --   --   --   --   --   < > = values in this interval not displayed.  Microbiology Recent Results (from the past 240 hour(s))  Urine culture     Status: None   Collection Time: 04/15/17  3:39 PM  Result Value Ref Range Status   Specimen Description URINE, CLEAN CATCH  Final   Special Requests Normal  Final   Culture NO GROWTH  Final   Report Status 04/16/2017 FINAL  Final  Culture, sputum-assessment     Status: None   Collection Time: 04/15/17 11:24 PM  Result Value Ref Range Status   Specimen Description EXPECTORATED SPUTUM  Final   Special Requests NONE  Final   Sputum evaluation THIS SPECIMEN IS ACCEPTABLE FOR SPUTUM CULTURE  Final   Report Status 04/18/2017 FINAL  Final  Culture, respiratory (NON-Expectorated)     Status: None   Collection Time: 04/15/17 11:24 PM  Result Value Ref Range Status   Specimen Description EXPECTORATED SPUTUM  Final   Special Requests NONE Reflexed from [id number]  Final   Gram Stain   Final    ABUNDANT WBC PRESENT,BOTH  PMN AND MONONUCLEAR NO ORGANISMS SEEN    Culture Consistent with normal respiratory flora.  Final   Report Status 04/20/2017 FINAL  Final  Culture, expectorated sputum-assessment     Status: None   Collection Time: 04/20/17 10:31 AM  Result Value Ref Range Status   Specimen Description EXPECTORATED SPUTUM  Final   Special Requests NONE  Final   Sputum evaluation THIS SPECIMEN IS ACCEPTABLE FOR SPUTUM CULTURE  Final   Report Status 04/20/2017 FINAL  Final  Culture, respiratory (NON-Expectorated)     Status: None (Preliminary result)   Collection Time: 04/20/17 10:31 AM  Result Value Ref Range Status   Specimen Description EXPECTORATED SPUTUM  Final   Special Requests NONE Reflexed from [id number]  Final   Gram Stain   Final    MODERATE WBC PRESENT,BOTH PMN AND MONONUCLEAR NO ORGANISMS SEEN    Culture CULTURE REINCUBATED FOR BETTER GROWTH  Final   Report Status PENDING  Incomplete    Radiology: No results found.  Medications:   . busPIRone  7.5 mg Oral BID  . clindamycin  300 mg Oral TID  . diclofenac sodium  4 g Topical BID  . feeding supplement (ENSURE ENLIVE)  237 mL Oral BID BM  . ipratropium-albuterol  3 mL Nebulization TID  . latanoprost  1 drop Both Eyes QHS  . LORazepam  0.5 mg Oral TID  . nystatin  5 mL Oral QID  . oxymetazoline  1 spray Each Nare BID  . pantoprazole  40 mg Oral Daily  . polyethylene glycol  17 g Oral Daily  . potassium chloride  10 mEq Oral BID  . saccharomyces boulardii  250 mg Oral BID  . sucralfate  1 g Oral TID WC & HS  . torsemide  20 mg Oral Daily  . traZODone  50 mg Oral QHS  . warfarin  5 mg Oral ONCE-1800  . Warfarin - Pharmacist Dosing Inpatient   Does not apply q1800   Continuous Infusions:   Medical decision making is of high complexity and this patient is at High risk of deterioration, therefore this is a level 3 visit.  (> 4 problem points, 1 data points, High risk: Need 2 out of 3)    Problems/DDx Points   Self limited or  minor (  max 2) (thrush, epistaxis)       1  2  Established problem, stable (hemoptysis, afib, pulm htn, CHF, PNA)       1  5  Established problem, worsening       2   New problem, no additional W/U planned (max 1)       3    New problem, additional W/U planned (hyperglycemia) (knee pain)       4  4   Data Reviewed Points   Review/order clinical lab tests       1  1  Review/order x-rays       1  1  Review/order tests (Echo, EKG, PFTs, etc)       1   Discussion of test results w/ performing MD       1   Independent review of image, tracing or specimen       2    Decision to obtain old records       1   Review and summation of old records       2     Level of risk Presenting prob Diagnostics Management   Minimal 1 self limited/minor Labs CXR EKG/EEG U/A U/S Rest Gargles Bandages Dressings   Low 2 or more self limited/minor 1 stable chronic Acute uncomplicated illness Tests (PFTS) Non-CV imaging Arterial labs Biopsies of skin OTC drugs Minor surgery-no risk PT OT IVF without additives    Moderate 1 or more chronic illnesses w/ mild exac, progression or S/E from tx 2 or more stable chronic illnesses Undiagnosed new problem w/ uncertain prognosis Acute complicated injury  Stress tests Endoscopies with no risk factors Deep needle or incisional bx CV imaging without risk LP Thoracentesis Paracentesis Minor surgery w/ risks Elective major surgery w/ no risk (open, percutaneous or endoscopic) Prescription drugs Therapeutic nucl med IVF with additives Closed tx of fracture/dislocation    High Severe exac of chronic illness Acute or chronic illness/injury may pose a threat to life or bodily function (ARF) Change in neuro status    CV imaging w/ contrast and risk Cardio electophysiologic tests Endoscopies w/ risk Discography Elective major surgery Emergency major surgery Parenteral controlled substances Drug therapy req monitoring for toxicity  (coumadin) DNR/de-escalation of care X   MDM Prob points Data points Risk   Straightforward    <1    <1    Min   Low complexity    2    2    Low   Moderate    3    3    Mod   High Complexity    4 or more    4 or more    High  X     LOS: 6 days   Ewald Beg  [facility] Pager [phone]. If unable to reach me by pager, please call my cell phone at [phone].  *Please refer to [email or web address], password [email or web address] to get updated schedule on who will round on this patient, as hospitalists switch teams weekly. If 7PM-7AM, please contact night-coverage at [email or web address], password [email or web address] for any overnight needs.  04/21/2017, 4:25 PM

## 2017-04-21 NOTE — Progress Notes (Signed)
Received a call from pt's. Room,  The  Niece at bedside saying  That the patient  Feels like shes passing out. This Rn asked the patient and she replied "oh it went away". V/s check,pls see flowsheet.

## 2017-04-21 NOTE — Progress Notes (Signed)
[place] for warfarin Indication: atrial fibrillation  Allergies  Allergen Reactions  . Fluticasone-Salmeterol Anaphylaxis  . Furosemide Shortness Of Breath  . Iodine Other (See Comments)    Reaction:  Fever and chills   . Iohexol Other (See Comments)    Desc:  Pt requires a 13 hr prep, when doing exam with contrast....fever, chills, dyspnea (slg/04/27/09--given 2 hour solumedrol/ benadryl prep--no reaction).    . Sulfonamide Derivatives Nausea And Vomiting  . Verapamil Nausea And Vomiting and Other (See Comments)    Reaction:  Loss of appetite   . Macrobid [Nitrofurantoin Monohyd Macro] Nausea Only and Other (See Comments)    Severe stomach pain  . Latex Rash  . Penicillins Rash and Other (See Comments)    Pt unable to answer additional questions about this medication.   Has patient had a PCN reaction causing immediate rash, facial/tongue/throat swelling, SOB or lightheadedness with hypotension: Yes Has patient had a PCN reaction causing severe rash involving mucus membranes or skin necrosis: UnknownHas patient had a PCN reaction that required hospitalization NoHas patient had a PCN reaction occurring within the last 10 years: No If all of the above answers are "NO", then may proceed with Cephalosporin    Patient Measurements: Height: 5\' 3"  (160 cm) Weight: 147 lb 12.8 oz (67 kg) IBW/kg (Calculated) : 52.4   Vital Signs: Temp: 98.5 F (36.9 C) (06/05 0640) Temp Source: Oral (06/05 0640) BP: 109/78 (06/05 0640) Pulse Rate: 67 (06/05 0640)  Labs:  Recent Labs  04/19/17 0301 04/19/17 1020 04/20/17 0235 04/21/17 0451  HGB 9.2*  --  9.1* 9.2*  HCT 28.5*  --  28.6* 29.3*  PLT 111*  --  132* 134*  LABPROT 19.4*  --  17.7* 16.3*  INR 1.62  --  1.44 1.30  CREATININE  --  0.85 0.78 0.89    Estimated Creatinine Clearance: 37.1 mL/min (by C-G formula based on SCr of 0.89 mg/dL).   Medical History: Past Medical History:  Diagnosis  Date  . AAA (abdominal aortic aneurysm) ([place])    a. 12/2011 U/S: 3.3 cm prox AAA.  [name] Anxiety   . Aortic insufficiency    a. S/P bioprosthetic AVR;  b. 02/2013 Echo: EF 50-55%, mild to mod AI (mean grad 43mmHg).  . Brain aneurysm    Hx of  . Chest pain    a. H/o nonobs cath;  b. 07/2013 Lexiscan MV: EF 66%, no ischemia.  . Chronic diastolic CHF (congestive heart failure) ([place])    a. 02/2013 Echo: EF 50-55%.  . Diverticulosis   . Gastritis   . Hemorrhoids   . Hepatitis B 1980's  . Hiatal hernia   . Hypertension   . Moderate mitral regurgitation    a. 02/2013 Echo: Mod MR.  . Moderate tricuspid regurgitation    a. 02/2013 Echo: Mod TR.  [name] Permanent atrial fibrillation ([place])    a. Chronic coumadin.  . Polymyalgia rheumatica ([place])   . Pulmonary hypertension ([place])    a. 02/2013 Echo: PASP 82mmHg.  . S/P aortic valve replacement with bioprosthetic valve    a. [facility].  . S/P placement of cardiac pacemaker    a.  08/2007 s/p MDT [place] PPM.  . Seizures ([place])   . Shingles   . Skin cancer    face, ear  and left hand.  . Tachy-brady syndrome ([place])    a. Severe in the setting of permanent atrial fibrillation with tachybrady syndrome;  b. 08/2007  s/p MDT [place] ADSR01 [place] PPM.  . Thoracic aneurysm    a. 10/2011 CTA: 5.0 x 3.8 cm.    Assessment: 83 yo female with AFib, on warfarin prior to admission.  Warfarin has been held due to hemoptysis, last dose was prior to admission on 5/29 and then was resumed yesterday evening. INR was therapeutic at admission, but did note recent supratherapeutic INR of 3.6 on 5/30. Current INR is 1.3.  PTA warfarin dose: 2.5 mg qFri, 5 mg all other days    Goal of Therapy:  INR 2-3 Monitor platelets by anticoagulation protocol: Yes    Plan:  -warfarin 5 mg po x1 -daily INR   Harvel Quale 04/21/2017,8:33 AM

## 2017-04-21 NOTE — Progress Notes (Signed)
PT Cancellation Note  Patient Details Name: [name] MRN: [id number] DOB: 05-16-1922   Cancelled Treatment:    Reason Eval/Treat Not Completed: Pain limiting ability to participate Pt with increased bilat knee pain this am. PT will check on pt later as time allows.    Salina April, PTA Pager: [phone]   04/21/2017, 9:29 AM

## 2017-04-22 LAB — CUP PACEART INCLINIC DEVICE CHECK
Brady Statistic RV Percent Paced: 100 %
Lead Channel Impedance Value: 0 Ohm
Lead Channel Impedance Value: 616 Ohm
Lead Channel Pacing Threshold Amplitude: 1 V
Lead Channel Setting Pacing Pulse Width: 0.4 ms
Lead Channel Setting Sensing Sensitivity: 2.8 mV
MDC IDC LEAD IMPLANT DT: 20081003
MDC IDC LEAD LOCATION: 753860
MDC IDC MSMT BATTERY IMPEDANCE: 132 Ohm
MDC IDC MSMT BATTERY REMAINING LONGEVITY: 112 mo
MDC IDC MSMT BATTERY VOLTAGE: 2.79 V
MDC IDC MSMT LEADCHNL RV PACING THRESHOLD PULSEWIDTH: 0.4 ms
MDC IDC PG IMPLANT DT: 20171004
MDC IDC SESS DTM: 20180529133928
MDC IDC SET LEADCHNL RV PACING AMPLITUDE: 2.5 V

## 2017-04-22 LAB — CULTURE, RESPIRATORY

## 2017-04-22 LAB — PROTIME-INR
INR: 1.6
Prothrombin Time: 19.2 seconds — ABNORMAL HIGH (ref 11.4–15.2)

## 2017-04-22 LAB — CBC
HCT: 28.8 % — ABNORMAL LOW (ref 36.0–46.0)
HEMOGLOBIN: 9 g/dL — AB (ref 12.0–15.0)
MCH: 29.7 pg (ref 26.0–34.0)
MCHC: 31.3 g/dL (ref 30.0–36.0)
MCV: 95 fL (ref 78.0–100.0)
PLATELETS: 133 10*3/uL — AB (ref 150–400)
RBC: 3.03 MIL/uL — AB (ref 3.87–5.11)
RDW: 14.1 % (ref 11.5–15.5)
WBC: 6.1 10*3/uL (ref 4.0–10.5)

## 2017-04-22 LAB — CULTURE, RESPIRATORY W GRAM STAIN: Culture: NORMAL

## 2017-04-22 MED ORDER — BUMETANIDE 0.25 MG/ML IJ SOLN
2.0000 mg | Freq: Once | INTRAMUSCULAR | Status: AC
  Administered 2017-04-22: 2 mg via INTRAVENOUS
  Filled 2017-04-22: qty 8

## 2017-04-22 MED ORDER — WARFARIN SODIUM 5 MG PO TABS
5.0000 mg | ORAL_TABLET | Freq: Once | ORAL | Status: AC
Start: 2017-04-22 — End: 2017-04-22
  Administered 2017-04-22: 5 mg via ORAL
  Filled 2017-04-22: qty 1

## 2017-04-22 NOTE — Progress Notes (Signed)
Physical Therapy Treatment Patient Details Name: [name] MRN: [id number] DOB: [birth year] Today's Date: 04/22/2017    History of Present Illness 83 yo admitted with resp failure with PNA. PMHx: tachybrady syndrome, pacemaker, AAA repair, AFib, AVR, CHF, PVD    PT Comments    Patient tolerated OOB transfers this session however unable to ambulate due to pain in bilat knees. Due to pt's current mobility level, recommending SNF for further skilled PT services to maximize independence and safety with mobility prior to return home. Pt and pt's niece agreeable to SNF. Continue to progress as tolerated.    Follow Up Recommendations  SNF     Equipment Recommendations  Rolling walker with 5" wheels (youth)    Recommendations for Other Services       Precautions / Restrictions Precautions Precautions: Fall Precaution Comments: fragile and sensitive skin    Mobility  Bed Mobility Overal bed mobility: Needs Assistance Bed Mobility: Supine to Sit     Supine to sit: HOB elevated;Mod assist     General bed mobility comments: cues for sequencing and use of rail; assist to bring bilat LE to EOB and to elevate trunk into sitting as well as scooting hips with use of bed pad  Transfers Overall transfer level: Needs assistance Equipment used: Rolling walker (2 wheeled);1 person hand held assist Transfers: Sit to/from [facility] Sit to Stand: Mod assist Stand pivot transfers: Mod assist;Max assist       General transfer comment: assist to power up into standing and cues for safe hand placement and technique when pivoting; first trial max A to stand pivot EOB to [facility] without AD and mod A second trial BSC to recliner with use of RW  Ambulation/Gait             General Gait Details: unable due to pain in bilat knees   Stairs            Wheelchair Mobility    Modified Rankin (Stroke Patients Only)       Balance Overall balance assessment: Needs  assistance   Sitting balance-Leahy Scale: Good     Standing balance support: Bilateral upper extremity supported Standing balance-Leahy Scale: Poor                              Cognition Arousal/Alertness: Awake/alert Behavior During Therapy: WFL for tasks assessed/performed Overall Cognitive Status: Within Functional Limits for tasks assessed                                        Exercises General Exercises - Lower Extremity Ankle Circles/Pumps: AROM;20 reps Long Arc Quad: AROM;Both;10 reps;Seated Heel Slides: AAROM;Both;10 reps;Supine    General Comments        Pertinent Vitals/Pain Pain Assessment: Faces Faces Pain Scale: Hurts even more Pain Location: bilat knees  Pain Descriptors / Indicators: Grimacing;Guarding;Sore Pain Intervention(s): Limited activity within patient's tolerance;Monitored during session;Premedicated before session;Repositioned    Home Living                      Prior Function            PT Goals (current goals can now be found in the care plan section) Progress towards PT goals: Not progressing toward goals - comment (limited by pain)    Frequency    Min  3X/week      PT Plan Discharge plan needs to be updated    Co-evaluation              AM-PAC PT "6 Clicks" Daily Activity  Outcome Measure  Difficulty turning over in bed (including adjusting bedclothes, sheets and blankets)?: A Little Difficulty moving from lying on back to sitting on the side of the bed? : Total Difficulty sitting down on and standing up from a chair with arms (e.g., wheelchair, bedside commode, etc,.)?: Total Help needed moving to and from a bed to chair (including a wheelchair)?: A Lot Help needed walking in hospital room?: Total Help needed climbing 3-5 steps with a railing? : Total 6 Click Score: 9    End of Session Equipment Utilized During Treatment: Oxygen;Gait belt Activity Tolerance: Patient limited by  pain Patient left: in chair;with call bell/phone within reach;with chair alarm set;with family/visitor present Nurse Communication: Mobility status;Precautions PT Visit Diagnosis: Other abnormalities of gait and mobility (R26.89);Difficulty in walking, not elsewhere classified (R26.2);Muscle weakness (generalized) (M62.81)     Time: 1210-1300 PT Time Calculation (min) (ACUTE ONLY): 50 min  Charges:  $Therapeutic Exercise: 8-22 mins $Therapeutic Activity: 23-37 mins                    G Codes:       Earney Navy, PTA Pager: [phone]     Darliss Cheney 04/22/2017, 2:02 PM

## 2017-04-22 NOTE — Progress Notes (Signed)
[place] for warfarin Indication: atrial fibrillation  Allergies  Allergen Reactions  . Fluticasone-Salmeterol Anaphylaxis  . Furosemide Shortness Of Breath  . Iodine Other (See Comments)    Reaction:  Fever and chills   . Iohexol Other (See Comments)    Desc:  Pt requires a 13 hr prep, when doing exam with contrast....fever, chills, dyspnea (slg/04/27/09--given 2 hour solumedrol/ benadryl prep--no reaction).    . Sulfonamide Derivatives Nausea And Vomiting  . Verapamil Nausea And Vomiting and Other (See Comments)    Reaction:  Loss of appetite   . Macrobid [Nitrofurantoin Monohyd Macro] Nausea Only and Other (See Comments)    Severe stomach pain  . Latex Rash  . Penicillins Rash and Other (See Comments)    Pt unable to answer additional questions about this medication.   Has patient had a PCN reaction causing immediate rash, facial/tongue/throat swelling, SOB or lightheadedness with hypotension: Yes Has patient had a PCN reaction causing severe rash involving mucus membranes or skin necrosis: UnknownHas patient had a PCN reaction that required hospitalization NoHas patient had a PCN reaction occurring within the last 10 years: No If all of the above answers are "NO", then may proceed with Cephalosporin    Patient Measurements: Height: 5\' 3"  (160 cm) Weight: 150 lb (68 kg) IBW/kg (Calculated) : 52.4   Vital Signs: Temp: 98 F (36.7 C) (06/06 0655) Temp Source: Oral (06/06 0655) BP: 110/46 (06/06 0655) Pulse Rate: 77 (06/06 0655)  Labs:  Recent Labs  04/19/17 1020  04/20/17 0235 04/21/17 0451 04/22/17 0408  HGB  --   < > 9.1* 9.2* 9.0*  HCT  --   --  28.6* 29.3* 28.8*  PLT  --   --  132* 134* 133*  LABPROT  --   --  17.7* 16.3* 19.2*  INR  --   --  1.44 1.30 1.60  CREATININE 0.85  --  0.78 0.89  --   < > = values in this interval not displayed.  Estimated Creatinine Clearance: 37.3 mL/min (by C-G formula based on SCr of 0.89  mg/dL).     Assessment: 83 yo female with AFib, on warfarin prior to admission.  Warfarin has been held due to hemoptysis, last dose was prior to admission on 5/29 and then was resumed yesterday evening. INR was therapeutic at admission, but did note recent supratherapeutic INR of 3.6 on 5/30. Current INR is 1.6  PTA warfarin dose: 2.5 mg qFri, 5 mg all other days    Goal of Therapy:  INR 2-3 Monitor platelets by anticoagulation protocol: Yes    Plan:  Repeat warfarin 5 mg po x 1 Daily INR  Thank you Anette Guarneri, PharmD [phone] 04/22/2017,8:33 AM

## 2017-04-22 NOTE — NC FL2 (Signed)
[place] LEVEL OF CARE SCREENING TOOL     IDENTIFICATION  Patient Name: [name] Birthdate: Mar 04, 1922 Sex: female Admission Date (Current Location): 04/15/2017  [facility] and [place] Number:  Herbalist and Address:  [facility], [place] [address], [place], [place] [address]      Provider Number: [id number]  Attending Physician Name and Address:  Verlee Monte, MD  Relative Name and Phone Number:       Current Level of Care: Hospital Recommended Level of Care: [place] Prior Approval Number:    Date Approved/Denied:   PASRR Number: [id number]  Discharge Plan: SNF    Current Diagnoses: Patient Active Problem List   Diagnosis Date Noted  . Left knee pain 04/21/2017  . Pharyngoesophageal phase dysphagia 04/20/2017  . Multifocal pneumonia 04/17/2017  . Acute respiratory failure with hypoxia ([place]) 04/15/2017  . Hemoptysis   . Thoracic aortic aneurysm without rupture ([place]) 11/21/2016  . Complete heart block ([place]) 08/20/2016  . CHF (congestive heart failure) ([place]) 05/10/2016  . Acute respiratory failure ([place]) 05/10/2016  . Rheumatoid arthritis ([place]) 05/10/2016  . Insomnia 05/10/2016  . Nausea 03/31/2016  . Pain in the muscles 08/31/2015  . S/P AAA repair 03/27/2015  . Tachy-brady syndrome ([place]) 03/27/2015  . Constipation 03/27/2015  . Dizziness 11/01/2014  . Pneumonia, organism unspecified([place]) 11/01/2014  . Fatigue 02/27/2014  . Pulmonary hypertension ([place]) 01/26/2014  . Encounter for therapeutic drug monitoring 12/15/2013  . Bronchitis 12/15/2013  . Frequent PVCs 12/15/2013  . Exertional angina ([place]) 07/07/2013  . Bradycardia 07/07/2013  . Ulcer of other part of lower limb 12/28/2012  . Hyperlipidemia 12/26/2010  . TIA 12/26/2010  . PACEMAKER-[employer] 05/07/2010  . S/P aortic valve replacement with bioprosthetic valve 01/15/2010  . MURMUR 01/15/2010  . DIASTOLIC HEART FAILURE, CHRONIC  10/26/2009  . PVD 07/17/2009  . ATRIAL FIBRILLATION 06/19/2009  . DIZZINESS AND GIDDINESS 06/19/2009    Orientation RESPIRATION BLADDER Height & Weight     Self, Time, Situation, Place  O2 (Nasal Canula 1.5 L) Continent, External catheter Weight: 150 lb (68 kg) Height:  5\' 3"  (160 cm)  BEHAVIORAL SYMPTOMS/MOOD NEUROLOGICAL BOWEL NUTRITION STATUS   (None)  (History of TIA) Continent Diet (Heart healthy. Fluid restriction: 1200 mL.)  AMBULATORY STATUS COMMUNICATION OF NEEDS Skin   Extensive Assist Verbally Bruising, Other (Comment) (Skin tear)                       Personal Care Assistance Level of Assistance  Bathing, Feeding, Dressing Bathing Assistance: Limited assistance Feeding assistance: Independent Dressing Assistance: Limited assistance     Functional Limitations Info  Sight, Hearing, Speech Sight Info: Adequate Hearing Info: Adequate Speech Info: Adequate    SPECIAL CARE FACTORS FREQUENCY  PT (By licensed PT), Blood pressure     PT Frequency: 5 x week              Contractures Contractures Info: Not present    Additional Factors Info  Code Status, Allergies Code Status Info: DNR Allergies Info: Fluticasone-salmeterol, Furosemide, Iodine, Iohexol, Sulfonamide Derivatives, Verapamil, Macrobid (Nitrofurantoin Monohyd Macro), Latex, Penicillins           Current Medications (04/22/2017):  This is the current hospital active medication list Current Facility-Administered Medications  Medication Dose Route Frequency Provider Last Rate Last Dose  . acetaminophen (TYLENOL) tablet 650 mg  650 mg Oral Q6H PRN Rise Patience, MD       Or  .  acetaminophen (TYLENOL) suppository 650 mg  650 mg Rectal Q6H PRN Rise Patience, MD      . busPIRone (BUSPAR) tablet 7.5 mg  7.5 mg Oral BID Rise Patience, MD   7.5 mg at 04/22/17 1004  . clindamycin (CLEOCIN) capsule 300 mg  300 mg Oral TID Harvel Quale, RPH   300 mg at 04/22/17 1004  .  diclofenac sodium (VOLTAREN) 1 % transdermal gel 4 g  4 g Topical BID Rise Patience, MD   4 g at 04/22/17 1004  . feeding supplement (ENSURE ENLIVE) (ENSURE ENLIVE) liquid 237 mL  237 mL Oral BID BM Opyd, Ilene Qua, MD   237 mL at 04/21/17 1400  . hydrALAZINE (APRESOLINE) injection 10 mg  10 mg Intravenous Q4H PRN Rise Patience, MD      . HYDROcodone-acetaminophen (NORCO/VICODIN) 5-325 MG per tablet 1 tablet  1 tablet Oral Q6H PRN Rise Patience, MD   1 tablet at 04/21/17 2119  . ipratropium (ATROVENT) nebulizer solution 0.5 mg  0.5 mg Nebulization Q4H PRN Rama, Christina P, MD      . ipratropium-albuterol (DUONEB) 0.5-2.5 (3) MG/3ML nebulizer solution 3 mL  3 mL Nebulization TID Rama, Venetia Maxon, MD   3 mL at 04/22/17 0851  . latanoprost (XALATAN) 0.005 % ophthalmic solution 1 drop  1 drop Both Eyes QHS Rise Patience, MD   1 drop at 04/21/17 2120  . LORazepam (ATIVAN) tablet 0.5 mg  0.5 mg Oral TID Rise Patience, MD   0.5 mg at 04/22/17 1004  . meclizine (ANTIVERT) tablet 12.5 mg  12.5 mg Oral TID PRN Rise Patience, MD      . nystatin (MYCOSTATIN) 100000 UNIT/ML suspension 500,000 Units  5 mL Oral QID Opyd, Ilene Qua, MD   500,000 Units at 04/22/17 1004  . ondansetron (ZOFRAN) tablet 4 mg  4 mg Oral Q6H PRN Rise Patience, MD       Or  . ondansetron [facility]) injection 4 mg  4 mg Intravenous Q6H PRN Rise Patience, MD      . pantoprazole (PROTONIX) EC tablet 40 mg  40 mg Oral Daily Rise Patience, MD   40 mg at 04/22/17 1004  . polyethylene glycol (MIRALAX / GLYCOLAX) packet 17 g  17 g Oral Daily Rama, Venetia Maxon, MD   17 g at 04/22/17 1005  . potassium chloride (K-DUR,KLOR-CON) CR tablet 10 mEq  10 mEq Oral BID Rama, Venetia Maxon, MD   10 mEq at 04/22/17 1004  . saccharomyces boulardii (FLORASTOR) capsule 250 mg  250 mg Oral BID Charlynne Cousins, MD   250 mg at 04/22/17 1003  . sucralfate (CARAFATE) 1 GM/10ML suspension 1 g  1 g Oral  TID WC & HS Rama, Venetia Maxon, MD   1 g at 04/22/17 1239  . torsemide (DEMADEX) tablet 20 mg  20 mg Oral Daily Rama, Venetia Maxon, MD   20 mg at 04/22/17 1004  . traMADol (ULTRAM) tablet 50 mg  50 mg Oral Q6H PRN Rise Patience, MD   50 mg at 04/22/17 1108  . traZODone (DESYREL) tablet 50 mg  50 mg Oral QHS Rise Patience, MD   50 mg at 04/21/17 2119  . warfarin (COUMADIN) tablet 5 mg  5 mg Oral ONCE-1800 Verlee Monte, MD      . Warfarin - Pharmacist Dosing Inpatient   Does not apply [address], [facility]  Discharge Medications: Please see discharge summary for a list of discharge medications.  Relevant Imaging Results:  Relevant Lab Results:   Additional Information SS#: [id number]  Candie Chroman, LCSW

## 2017-04-22 NOTE — Progress Notes (Signed)
Progress Note    [name]  OVZ:[id number] DOB: 02-Jul-1922  DOA: 04/15/2017 PCP: Jodi Marble, MD    Brief Narrative:   Chief complaint: Follow-up multifocal pneumonia  Medical records reviewed and are as summarized below:  [name] is an 83 y.o. female with a PMH of tachybradycardia syndrome status post pacemaker placement, permanent atrial fibrillation, bioprosthetic aortic valve replacement, history of AAA repair and diastolic CHF who was admitted 04/15/17 with a chief complaint of shortness of breath and hemoptysis. She initially required BiPAP. CT of the chest showed multifocal pneumonia/aspiration with necrosis.  Assessment/Plan:   Principal Problem:   Acute respiratory failure with hypoxia (HCC) Secondary to multifocal aspiration pneumonia complicated by necrosis/ in the setting of pharyngoesophageal phase dysphasia BiPAP weaned. Evaluated by speech therapy. Speech therapy evaluation showed delayed initiation with tight UES/decreased laryngeal closure. Recommendations were for regular solids, thin liquids and aspiration precautions. Continue empiric clindamycin. -His sputum culture only showed oral flora. -Currently on clindamycin, appears to be improving.    Left knee pain with inability to bear weight -Patient has severe left knee pain, x-ray done showed also arthritis with effusion. -Per niece at bedside she has PMR she was recently taken off of the prednisone. -Restart steroids, will place on Solu-Medrol.    Epistaxis Continue Afrin every 12 hours 48 hours, no reports of further epistaxis.    Thrush Improved. Continue nystatin, no visible thrush in mouth today.    Hemoptysis Likely secondary to multifocal pneumonia in the setting of chronic anticoagulation. Hemoglobin stable 72 hours, but patient continues to report intermittent hemoptysis.    ATRIAL FIBRILLATION -Rate is controlled, CHA2DS2-VASc is greater than 3. -On Coumadin, this is  restarted. Hemoglobin is stable.    PVD Stable.     Pulmonary hypertension (HCC) PA peak pressure 46 mmHg on echocardiogram done 05/11/16. No changes clinically.    S/P AAA repair/distal aortic arch aneurysm Continue blood pressure control. 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 The mid descending thoracic aorta measures 4.9 cm and is stable. Distal descending thoracic aorta measures 4.3 cm and is stable.       History of Tachy-brady syndrome ([place]) status post pacemaker Heart rate remains well controlled: 67-92.    Chronic diastolic CHF (congestive heart failure) (HCC) EF 55-60 percent on Echo done 05/11/16. Remains clinically compensated. I/O balance - 730 cc, On torsemide 20 mg every other day. Renal function stable at baseline.    Rheumatoid arthritis ([place]) Stable. Pain management as needed.    Hyperglycemia Glucose is 116-241 on BMET. No evidence of diabetes, Hemoglobin A1c 5.5%.    Pancytopenia Platelets and hemoglobin persistently low. Leukopenia resolved. 5 g drop in hemoglobin over admission values noted. Stable.   Family Communication/Anticipated D/C date and plan/Code Status   DVT prophylaxis: SCDs. Code Status: DO NOT RESUSCITATE.  Family Communication: Niece at the bedside. Disposition Plan: Home when hemoptysis resolved and sputum cultures back.   Medical Consultants:    None.   Procedures:    None  Anti-Infectives:    Clindamycin 04/17/17--->  Flagyl 04/15/17---> 04/17/17  Levaquin 04/15/17---> 04/17/17  Rocephin 04/15/17---> 04/15/17   Subjective:   Patient reports no left-sided knee pain and inability to bear weight. She reports having had a few episodes of hemoptysis in the past 24 hours, but no further epistaxis. Swallowing better, and eating better.   Objective:    Vitals:   04/21/17 2045 04/22/17 0655 04/22/17 0851 04/22/17 0959  BP:  (!) 110/46  [name])  115/42  Pulse:  77  75  Resp:  18  18  Temp:  98 F (36.7 C)    TempSrc:  Oral    SpO2:  94% 98% 98% 100%  Weight:  68 kg (150 lb)    Height:        Intake/Output Summary (Last 24 hours) at 04/22/17 1145 Last data filed at 04/22/17 0938  Gross per 24 hour  Intake             1060 ml  Output              600 ml  Net              460 ml   Filed Weights   04/20/17 0452 04/21/17 0640 04/22/17 0655  Weight: 67.4 kg (148 lb 8 oz) 67 kg (147 lb 12.8 oz) 68 kg (150 lb)    Exam:   General exam: Appears weaker today.  Oropharynx: Thrush resolved. Respiratory system: Unchanged from 04/20/17: Decreased breath sounds but clear today. Cardiovascular system: Unchanged from 04/20/17: Regular rate, and rhythm. No murmurs, rubs, or gallops. Grade 3/6 murmur.   Gastrointestinal system: Unchanged from 04/20/17: Soft, nontender, nondistended with normal active bowel sounds. No masses.  Central nervous system: Unchanged from 04/20/17: Alert and oriented. No focal neurological deficits.. Extremities: Unchanged from 04/20/17: No clubbing, cyanosis, or edema. Thick skin with venous stasis changes to the lower extremities. MSK: Left knee swollen. Skin: Unchanged from 04/20/17: Venous stasis changes and scattered ecchymosis. Psychiatry: . Unchanged from 04/20/17: Insight/judgment good. Affect bright.  Data Reviewed:   I have personally reviewed following labs and imaging studies:  Labs: Basic Metabolic Panel:  Recent Labs Lab 04/15/17 1410 04/16/17 0355 04/19/17 1020 04/20/17 0235 04/21/17 0451  NA 135 138 141 139 138  K 3.9 4.0 3.5 3.6 4.1  CL 99* 104 100* 98* 94*  CO2 27 25 32 36* 33*  GLUCOSE 241* 178* 122* 116* 140*  BUN 23* 22* 16 23* 19  CREATININE 1.03* 0.94 0.85 0.78 0.89  CALCIUM 9.0 8.2* 8.1* 8.0* 8.1*   GFR Estimated Creatinine Clearance: 37.3 mL/min (by C-G formula based on SCr of 0.89 mg/dL). Liver Function Tests: No results for input(s): AST, ALT, ALKPHOS, BILITOT, PROT, ALBUMIN in the last 168 hours. No results for input(s): LIPASE, AMYLASE in the last 168 hours. No  results for input(s): AMMONIA in the last 168 hours. Coagulation profile  Recent Labs Lab 04/18/17 0328 04/19/17 0301 04/20/17 0235 04/21/17 0451 04/22/17 0408  INR 2.07 1.62 1.44 1.30 1.60    CBC:  Recent Labs Lab 04/15/17 2214  04/18/17 0328 04/19/17 0301 04/20/17 0235 04/21/17 0451 04/22/17 0408  WBC 7.0  < > 4.8 5.7 5.0 7.1 6.1  NEUTROABS 5.7  --   --   --   --   --   --   HGB 11.9*  < > 8.6* 9.2* 9.1* 9.2* 9.0*  HCT 36.8  < > 27.0* 28.5* 28.6* 29.3* 28.8*  MCV 92.9  < > 92.8 92.2 93.8 93.9 95.0  PLT 157  < > 87* 111* 132* 134* 133*  < > = values in this interval not displayed. D-Dimer: No results for input(s): DDIMER in the last 72 hours. Sepsis Labs:  Recent Labs Lab 04/15/17 1422  04/19/17 0301 04/20/17 0235 04/21/17 0451 04/22/17 0408  WBC  --   < > 5.7 5.0 7.1 6.1  LATICACIDVEN 1.49  --   --   --   --   --   < > =  values in this interval not displayed.  Microbiology Recent Results (from the past 240 hour(s))  Urine culture     Status: None   Collection Time: 04/15/17  3:39 PM  Result Value Ref Range Status   Specimen Description URINE, CLEAN CATCH  Final   Special Requests Normal  Final   Culture NO GROWTH  Final   Report Status 04/16/2017 FINAL  Final  Culture, sputum-assessment     Status: None   Collection Time: 04/15/17 11:24 PM  Result Value Ref Range Status   Specimen Description EXPECTORATED SPUTUM  Final   Special Requests NONE  Final   Sputum evaluation THIS SPECIMEN IS ACCEPTABLE FOR SPUTUM CULTURE  Final   Report Status 04/18/2017 FINAL  Final  Culture, respiratory (NON-Expectorated)     Status: None   Collection Time: 04/15/17 11:24 PM  Result Value Ref Range Status   Specimen Description EXPECTORATED SPUTUM  Final   Special Requests NONE Reflexed from [id number]  Final   Gram Stain   Final    ABUNDANT WBC PRESENT,BOTH PMN AND MONONUCLEAR NO ORGANISMS SEEN    Culture Consistent with normal respiratory flora.  Final   Report  Status 04/20/2017 FINAL  Final  Culture, expectorated sputum-assessment     Status: None   Collection Time: 04/20/17 10:31 AM  Result Value Ref Range Status   Specimen Description EXPECTORATED SPUTUM  Final   Special Requests NONE  Final   Sputum evaluation THIS SPECIMEN IS ACCEPTABLE FOR SPUTUM CULTURE  Final   Report Status 04/20/2017 FINAL  Final  Culture, respiratory (NON-Expectorated)     Status: None (Preliminary result)   Collection Time: 04/20/17 10:31 AM  Result Value Ref Range Status   Specimen Description EXPECTORATED SPUTUM  Final   Special Requests NONE Reflexed from [id number]  Final   Gram Stain   Final    MODERATE WBC PRESENT,BOTH PMN AND MONONUCLEAR NO ORGANISMS SEEN    Culture CULTURE REINCUBATED FOR BETTER GROWTH  Final   Report Status PENDING  Incomplete    Radiology: Dg Knee 1-2 Views Left  Result Date: 04/21/2017 CLINICAL DATA:  Knee swelling EXAM: LEFT KNEE - 1-2 VIEW COMPARISON:  None. FINDINGS: Osteopenia. Severe degenerative change. Large suprapatellar joint effusion. Vascular calcifications. No definite acute fracture. IMPRESSION: Degenerative change and joint effusion. Electronically Signed   By: Marybelle Killings M.D.   On: 04/21/2017 18:00    Medications:   . busPIRone  7.5 mg Oral BID  . clindamycin  300 mg Oral TID  . diclofenac sodium  4 g Topical BID  . feeding supplement (ENSURE ENLIVE)  237 mL Oral BID BM  . ipratropium-albuterol  3 mL Nebulization TID  . latanoprost  1 drop Both Eyes QHS  . LORazepam  0.5 mg Oral TID  . nystatin  5 mL Oral QID  . pantoprazole  40 mg Oral Daily  . polyethylene glycol  17 g Oral Daily  . potassium chloride  10 mEq Oral BID  . saccharomyces boulardii  250 mg Oral BID  . sucralfate  1 g Oral TID WC & HS  . torsemide  20 mg Oral Daily  . traZODone  50 mg Oral QHS  . warfarin  5 mg Oral ONCE-1800  . Warfarin - Pharmacist Dosing Inpatient   Does not apply q1800   Continuous Infusions:   Medical decision making is  of high complexity and this patient is at High risk of deterioration, therefore this is a level 3 visit.  (> 4 problem  points, 1 data points, High risk: Need 2 out of 3)    Problems/DDx Points   Self limited or minor (max 2) (thrush, epistaxis)       1  2  Established problem, stable (hemoptysis, afib, pulm htn, CHF, PNA)       1  5  Established problem, worsening       2   New problem, no additional W/U planned (max 1)       3    New problem, additional W/U planned (hyperglycemia) (knee pain)       4  4   Data Reviewed Points   Review/order clinical lab tests       1  1  Review/order x-rays       1  1  Review/order tests (Echo, EKG, PFTs, etc)       1   Discussion of test results w/ performing MD       1   Independent review of image, tracing or specimen       2    Decision to obtain old records       1   Review and summation of old records       2     Level of risk Presenting prob Diagnostics Management   Minimal 1 self limited/minor Labs CXR EKG/EEG U/A U/S Rest Gargles Bandages Dressings   Low 2 or more self limited/minor 1 stable chronic Acute uncomplicated illness Tests (PFTS) Non-CV imaging Arterial labs Biopsies of skin OTC drugs Minor surgery-no risk PT OT IVF without additives    Moderate 1 or more chronic illnesses w/ mild exac, progression or S/E from tx 2 or more stable chronic illnesses Undiagnosed new problem w/ uncertain prognosis Acute complicated injury  Stress tests Endoscopies with no risk factors Deep needle or incisional bx CV imaging without risk LP Thoracentesis Paracentesis Minor surgery w/ risks Elective major surgery w/ no risk (open, percutaneous or endoscopic) Prescription drugs Therapeutic nucl med IVF with additives Closed tx of fracture/dislocation    High Severe exac of chronic illness Acute or chronic illness/injury may pose a threat to life or bodily function (ARF) Change in neuro status    CV imaging w/ contrast and  risk Cardio electophysiologic tests Endoscopies w/ risk Discography Elective major surgery Emergency major surgery Parenteral controlled substances Drug therapy req monitoring for toxicity (coumadin) DNR/de-escalation of care X   MDM Prob points Data points Risk   Straightforward    <1    <1    Min   Low complexity    2    2    Low   Moderate    3    3    Mod   High Complexity    4 or more    4 or more    High  X     LOS: 7 days   [facility] A  [facility] Pager [phone]. If unable to reach me by pager, please call my cell phone at [phone].  *Please refer to [email or web address], password [email or web address] to get updated schedule on who will round on this patient, as hospitalists switch teams weekly. If 7PM-7AM, please contact night-coverage at [email or web address], password [email or web address] for any overnight needs.  04/22/2017, 11:45 AM

## 2017-04-22 NOTE — Clinical Social Work Note (Signed)
CSW met with patient and her niece (not the one she lives with) at bedside to discuss updated PT recommendations for SNF. Patient wants to wait until the niece that she lives with arrives at the hospital to discuss placement. CSW left SNF list for review and contact card. CSW will follow up with patient and her niece tomorrow.  [name], [place]

## 2017-04-23 DIAGNOSIS — I739 Peripheral vascular disease, unspecified: Secondary | ICD-10-CM

## 2017-04-23 DIAGNOSIS — I509 Heart failure, unspecified: Secondary | ICD-10-CM

## 2017-04-23 DIAGNOSIS — M069 Rheumatoid arthritis, unspecified: Secondary | ICD-10-CM

## 2017-04-23 LAB — PROTIME-INR
INR: 1.94
Prothrombin Time: 22.5 seconds — ABNORMAL HIGH (ref 11.4–15.2)

## 2017-04-23 MED ORDER — LORAZEPAM 0.5 MG PO TABS: 0.5000 mg | ORAL_TABLET | Freq: Three times a day (TID) | ORAL | 0 refills | Status: AC | PRN

## 2017-04-23 MED ORDER — POLYETHYLENE GLYCOL 3350 17 G PO PACK: 17.0000 g | PACK | Freq: Every day | ORAL | 0 refills | Status: AC | PRN

## 2017-04-23 MED ORDER — TORSEMIDE 20 MG PO TABS: 20.0000 mg | ORAL_TABLET | Freq: Every day | ORAL | Status: DC | PRN

## 2017-04-23 MED ORDER — SUCRALFATE 1 GM/10ML PO SUSP: 1.0000 g | Freq: Three times a day (TID) | ORAL | 0 refills | Status: DC

## 2017-04-23 MED ORDER — TRAMADOL HCL 50 MG PO TABS: 50.0000 mg | ORAL_TABLET | Freq: Four times a day (QID) | ORAL | 0 refills | Status: AC | PRN

## 2017-04-23 MED ORDER — TORSEMIDE 20 MG PO TABS: 30.0000 mg | ORAL_TABLET | Freq: Every day | ORAL | Status: DC

## 2017-04-23 MED ORDER — ACETAMINOPHEN 500 MG PO TABS: 500.0000 mg | ORAL_TABLET | Freq: Four times a day (QID) | ORAL | 0 refills | Status: AC

## 2017-04-23 MED ORDER — ENSURE ENLIVE PO LIQD: 237.0000 mL | Freq: Two times a day (BID) | ORAL | 12 refills | Status: DC

## 2017-04-23 MED ORDER — IPRATROPIUM BROMIDE 0.02 % IN SOLN: 0.5000 mg | RESPIRATORY_TRACT | 12 refills | Status: DC | PRN

## 2017-04-23 MED ORDER — SACCHAROMYCES BOULARDII 250 MG PO CAPS: 250.0000 mg | ORAL_CAPSULE | Freq: Two times a day (BID) | ORAL | 0 refills | Status: AC

## 2017-04-23 NOTE — Clinical Social Work Placement (Signed)
   CLINICAL SOCIAL WORK PLACEMENT  NOTE  Date:  04/23/2017  Patient Details  Name: [name] MRN: [id number] Date of Birth: [birth year]  Clinical Social Work is seeking post-discharge placement for this patient at the [place] level of care (*CSW will initial, date and re-position this form in  chart as items are completed):  Yes   Patient/family provided with [facility] Work Department's list of facilities offering this level of care within the geographic area requested by the patient (or if unable, by the patient's family).  Yes   Patient/family informed of their freedom to choose among providers that offer the needed level of care, that participate in Medicare, Medicaid or managed care program needed by the patient, have an available bed and are willing to accept the patient.  Yes   Patient/family informed of [facility]'s ownership interest in [facility] and [facility], as well as of the fact that they are under no obligation to receive care at these facilities.  PASRR submitted to EDS on 04/22/17     PASRR number received on 04/22/17     Existing PASRR number confirmed on       FL2 transmitted to all facilities in geographic area requested by pt/family on 04/23/17     FL2 transmitted to all facilities within larger geographic area on       Patient informed that his/her managed care company has contracts with or will negotiate with certain facilities, including the following:        Yes   Patient/family informed of bed offers received.  Patient chooses bed at [facility]     Physician recommends and patient chooses bed at      Patient to be transferred to [facility] on 04/23/17.  Patient to be transferred to facility by PT[facility]     Patient family notified on 04/23/17 of transfer.  Name of family member notified:  Faylene Million and DOris     PHYSICIAN Please prepare prescriptions     Additional Comment:     _______________________________________________ Candie Chroman, LCSW 04/23/2017, 10:43 AM

## 2017-04-23 NOTE — Progress Notes (Signed)
[place], report given to [place]. [phone]. Pt will be going to room 202A. Informed RN to weigh pt daily per MD order Pt IV discontinued, catheter intact and pt non telemetry. Awaiting PTAR. All printed prescriptions signed, DNR form signed and AVS are in discharge folder.   Dewel Lotter

## 2017-04-23 NOTE — Clinical Social Work Note (Signed)
CSW facilitated patient discharge including contacting patient family and facility to confirm patient discharge plans. Clinical information faxed to facility and family agreeable with plan. CSW arranged ambulance transport via [facility] to [facility] at 1:00 pm. RN to call report prior to discharge [phone] Room 202A).  CSW will sign off for now as social work intervention is no longer needed. Please consult [place] again if new needs arise.  [name], [place]

## 2017-04-23 NOTE — Progress Notes (Signed)
Family at bedside, update given per pt request  

## 2017-04-23 NOTE — Progress Notes (Signed)
MD stated he talked to family and placed new order for pt to be weighed daily on AVS. MD ask social work to inform facility. Social worker aware.

## 2017-04-23 NOTE — Progress Notes (Signed)
Paged MD regarding pt family expressing concerns that pt is not at dry weight and has swelling of her knees. Family requesting to see MD prior to discharge. Pt family also expressed concerns in regards to pt medication, torsemide. Provided education that pt is nonsymptomatic and is on room air with saturations above 90%.  Also informed MD that printed prescriptions need signatures prior to discharge MD returned page and stated he would come to talk to the family and sign paperwork. MD stated she is medically cleared for discharge and swelling is related from arthritis.  Informed pt and family that MD will see pt prior to discharge Social work stated discharge set up after lunch  Spencer Peterkin Leory Plowman

## 2017-04-23 NOTE — Progress Notes (Signed)
Physical Therapy Treatment Patient Details Name: [name] MRN: [id number] DOB: 1922-03-15 Today's Date: 04/23/2017    History of Present Illness 83 yo admitted with resp failure with PNA. PMHx: tachybrady syndrome, pacemaker, AAA repair, AFib, AVR, CHF, PVD    PT Comments    Patient able to take some steps today and required less assist for OOB transfers this session. Pt continues to be limited by knee pain especially R knee. R knee edema present and L knee edema is decreased. SpO2 >90% on RA throughout session. Current plan remains appropriate.    Follow Up Recommendations  SNF     Equipment Recommendations  Rolling walker with 5" wheels (youth)    Recommendations for Other Services       Precautions / Restrictions Precautions Precautions: Fall Precaution Comments: fragile and sensitive skin    Mobility  Bed Mobility Overal bed mobility: Needs Assistance Bed Mobility: Supine to Sit     Supine to sit: HOB elevated;Mod assist     General bed mobility comments: cues for sequencing and use of rail; assist to bring R LE to EOB and to elevate trunk into sitting  Transfers Overall transfer level: Needs assistance Equipment used: Rolling walker (2 wheeled);1 person hand held assist Transfers: Sit to/from [facility] Sit to Stand: Mod assist Stand pivot transfers: Min assist       General transfer comment: mod A to power up into standing and to gain balance upon stand as pt had posterior lean upon standing from EOB and BSC; min A to pivot EOB to [facility]  Ambulation/Gait Ambulation/Gait assistance: Mod assist Ambulation Distance (Feet): 1 Feet Assistive device: Rolling walker (2 wheeled) Gait Pattern/deviations: Shuffle;Decreased stride length;Trunk flexed     General Gait Details: pt able to take steps from [facility] to recliner with assist for balance and managing RW   Stairs            Wheelchair Mobility    Modified Rankin (Stroke Patients  Only)       Balance Overall balance assessment: Needs assistance   Sitting balance-Leahy Scale: Good     Standing balance support: Bilateral upper extremity supported Standing balance-Leahy Scale: Poor                              Cognition Arousal/Alertness: Awake/alert Behavior During Therapy: WFL for tasks assessed/performed Overall Cognitive Status: Within Functional Limits for tasks assessed                                        Exercises General Exercises - Lower Extremity Ankle Circles/Pumps: AROM;20 reps Long Arc Quad: AROM;Both;10 reps;Seated Heel Slides: AAROM;Both;10 reps;Supine Hip ABduction/ADduction: AROM;Both;15 reps;Supine    General Comments General comments (skin integrity, edema, etc.): SpO2 >90% on RA throughout session      Pertinent Vitals/Pain Pain Assessment: Faces Faces Pain Scale: Hurts even more Pain Location: R knee especially but bilat knees Pain Descriptors / Indicators: Grimacing;Guarding;Sore;Moaning Pain Intervention(s): Limited activity within patient's tolerance;Monitored during session;Repositioned;Premedicated before session;RN gave pain meds during session    Home Living                      Prior Function            PT Goals (current goals can now be found in the care plan section) Progress towards  PT goals: Progressing toward goals    Frequency    Min 3X/week      PT Plan Current plan remains appropriate    Co-evaluation              AM-PAC PT "6 Clicks" Daily Activity  Outcome Measure  Difficulty turning over in bed (including adjusting bedclothes, sheets and blankets)?: A Little Difficulty moving from lying on back to sitting on the side of the bed? : Total Difficulty sitting down on and standing up from a chair with arms (e.g., wheelchair, bedside commode, etc,.)?: Total Help needed moving to and from a bed to chair (including a wheelchair)?: A Lot Help needed  walking in hospital room?: A Lot Help needed climbing 3-5 steps with a railing? : Total 6 Click Score: 10    End of Session Equipment Utilized During Treatment: Oxygen;Gait belt Activity Tolerance: Patient limited by pain Patient left: in chair;with call bell/phone within reach;with family/visitor present Nurse Communication: Mobility status PT Visit Diagnosis: Other abnormalities of gait and mobility (R26.89);Difficulty in walking, not elsewhere classified (R26.2);Muscle weakness (generalized) (M62.81)     Time: 9233-0076 PT Time Calculation (min) (ACUTE ONLY): 50 min  Charges:  $Therapeutic Exercise: 8-22 mins $Therapeutic Activity: 23-37 mins                    G Codes:       Earney Navy, PTA Pager: [phone]     Darliss Cheney 04/23/2017, 3:45 PM

## 2017-04-23 NOTE — Progress Notes (Signed)
PTAR at bedside. Pt changed into paper gown. Pt on stretcher, family took all pt belongings. Transporter has printed prescriptions, DNR form and AVS.

## 2017-04-23 NOTE — Progress Notes (Signed)
MD at bedside stated pt will be discharged today to rehab and to inform social work. Social work aware. Continue weaning pt oxygen, pt on 0.5L  [name]

## 2017-04-23 NOTE — Discharge Summary (Addendum)
Physician Discharge Summary  [name] JJO:[id number] DOB: Mar 21, 1922 DOA: 04/15/2017  PCP: [name] Marble, MD Cardiologist Dr. Karl [name]  Admit date: 04/15/2017 Discharge date: 04/23/2017  Admitted From: Home  Disposition: SNF Recommendations for Outpatient Follow-up:  1. Follow up with PCP in 1-2 weeks. Follow up with cardiologist in 2 weeks.  2. Please obtain BMP/CBC in one week 3. Please check [name]/INR every 2 days until stable and therapeutic (INR 2-3), adjust warfarin dose if needed 4. Goal oxygen saturation 88-92% on room air 5. Fall Precautions 6. Have [name] follow patient  7. Have speech therapy follow patient  8. Aspiration precautions 9. Please weigh daily and report weight gain of 3 pounds to MD and cardiologist Dr. Karl [name]  Discharge Condition:stable  CODE STATUS:DNR   Brief/Interim Summary:  HPI: [name] is a 83 y.o. female with history of tachybradycardia syndrome status post pacemaker placement, permanent atrial fibrillation, bioprosthetic aortic valve replacement, abdominal aortic aneurysm status post repair, diastolic CHF was brought to the ER after patient was having shortness of breath with hemoptysis. Patient had gone yesterday for having CAT scan of the abdomen and pelvis since patient was having mid back pain radiating to the front for last few weeks. After the CAT scan was done patient started coughing up with shortness of breath. Patient was having hemoptysis. After reaching home patient became short of breath and EMS was called and was brought to the hospital.   ED Course: Initially patient was placed on BiPAP. CT is available the chest shows multifocal pneumonia with possible aspiration and also necrosis. Patient was started on empiric antibiotics. BiPAP was weaned off eventually. Denies any chest pain at this time.  [name] is an 83 y.o. female with a PMH of tachybradycardia syndrome status post pacemaker placement, permanent atrial  fibrillation, bioprosthetic aortic valve replacement, history of AAA repair and diastolic CHF who was admitted 04/15/17 with a chief complaint of shortness of breath and hemoptysis. She initially required BiPAP. CT of the chest showed multifocal pneumonia/aspiration with necrosis.  Assessment/Plan:    Acute respiratory failure with hypoxia secondary to multifocal aspiration pneumonia complicated by necrosis/ in the setting of pharyngoesophageal phase dysphasia BiPAP weaned. Evaluated by speech therapy. Speech therapy evaluation showed delayed initiation with tight UES/decreased laryngeal closure. Recommendations were for regular solids, thin liquids and aspiration precautions. Completed clindamycin. -Her sputum culture only showed normal oral flora. -Completed clindamycin,  appears to be improving.  Weaned of oxygen. Goal pulse ox 88-92% on RA.     Left knee pain with inability to bear weight/Rheumatoid Arthritis -Patient has severe left knee pain, x-ray done showed also arthritis with effusion. -Schedule tylenol with tramadol ordered as needed for pain management.      Epistaxis - Resolved  no reports of further epistaxis, was treated with afrin NS in hospital     Thrush Treated. Completed nystatin, no visible thrush in mouth.    Hemoptysis Likely secondary to multifocal pneumonia in the setting of chronic anticoagulation. Hemoglobin stable.  Repeat CBC in 1 week.     ATRIAL FIBRILLATION -Rate is controlled, CHA2DS2-VASc is greater than 3. -On Coumadin for anticoagulation. Hemoglobin is stable.    PVD Stable.     Pulmonary hypertension PA peak pressure 46 mmHg on echocardiogram done 05/11/16. No changes clinically.    S/P AAA repair/distal aortic arch aneurysm Continue blood pressure control. 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 The mid descending thoracic aorta measures 4.9 cm and is stable. Distal descending thoracic aorta  measures 4.3 cm and is stable.       History  of Tachy-brady syndrome ([place]) status post pacemaker Heart rate remains well controlled: 67-92.    Chronic diastolic CHF (congestive heart failure) EF 55-60 percent on Echo done 05/11/16. Remains clinically compensated. I/O balance - 730 cc, resume home torsemide 30 mg daily  Renal function stable at baseline.  [name] had diuresed 2 liters since admission and no symptoms of exacerbation of heart failure at this time.  Follow up with Dr. Caryl Comes in 1-2 weeks.      Rheumatoid arthritis  Stable. Pain management as needed.    Hyperglycemia Glucose is 116-241 on BMET. No evidence of diabetes, Hemoglobin A1c 5.5%.   Family Communication/Anticipated D/C date and plan/Code Status   DVT prophylaxis: SCDs Code Status: DO NOT RESUSCITATE.  Family Communication: Niece at the bedside. Disposition Plan: SNF    Medical Consultants:    None.   Procedures:    Anti-Infectives:    Clindamycin 04/17/17--->04/23/17  Flagyl 04/15/17---> 04/17/17  Levaquin 04/15/17---> 04/17/17  Rocephin 04/15/17---> 04/15/17   Discharge Diagnoses:  Principal Problem:   Acute respiratory failure with hypoxia ([place]) Active Problems:   ATRIAL FIBRILLATION   PVD   Pulmonary hypertension (HCC)   Pneumonia, organism unspecified([place])   S/P AAA repair   Tachy-brady syndrome (HCC)   CHF (congestive heart failure) (HCC)   Rheumatoid arthritis (HCC)   Multifocal pneumonia   Pharyngoesophageal phase dysphagia   Left knee pain  Discharge Instructions  Discharge Instructions    Discharge instructions    Complete by:  As directed    Monitor [name]/INR every 2 days until stable and adjust warfarin doses as needed.  Fall precautions recommended.  Please check BMP and CBC in 1 week.   Increase activity slowly    Complete by:  As directed      Allergies as of 04/23/2017      Reactions   Fluticasone-salmeterol Anaphylaxis   Furosemide Shortness Of Breath   Iodine Other (See Comments)   Reaction:  Fever and chills     Iohexol Other (See Comments)   Desc:  [name] requires a 13 hr prep, when doing exam with contrast....fever, chills, dyspnea (slg/04/27/09--given 2 hour solumedrol/ benadryl prep--no reaction).     Sulfonamide Derivatives Nausea And Vomiting   Verapamil Nausea And Vomiting, Other (See Comments)   Reaction:  Loss of appetite    Macrobid [nitrofurantoin Monohyd Macro] Nausea Only, Other (See Comments)   Severe stomach pain   Latex Rash   Penicillins Rash, Other (See Comments)   [name] unable to answer additional questions about this medication.   Has patient had a PCN reaction causing immediate rash, facial/tongue/throat swelling, SOB or lightheadedness with hypotension: Yes Has patient had a PCN reaction causing severe rash involving mucus membranes or skin necrosis: UnknownHas patient had a PCN reaction that required hospitalization NoHas patient had a PCN reaction occurring within the last 10 years: No If all of the above answers are "NO", then may proceed with Cephalosporin      Medication List    STOP taking these medications   ciprofloxacin 250 MG tablet Commonly known as:  CIPRO   HYDROcodone-acetaminophen 5-325 MG tablet Commonly known as:  NORCO/VICODIN     TAKE these medications   acetaminophen 500 MG tablet Commonly known as:  TYLENOL Take 1 tablet (500 mg total) by mouth every 6 (six) hours. What changed:  when to take this  reasons to take this   albuterol 108 (90 Base)  MCG/ACT inhaler Commonly known as:  PROVENTIL HFA;VENTOLIN HFA Inhale 1 puff into the lungs every 6 (six) hours as needed for wheezing or shortness of breath.   busPIRone 7.5 MG tablet Commonly known as:  BUSPAR Take 7.5 mg by mouth 2 (two) times daily.   diclofenac sodium 1 % Gel Commonly known as:  VOLTAREN Apply 4 g topically 2 (two) times daily.   feeding supplement (ENSURE ENLIVE) Liqd Take 237 mLs by mouth 2 (two) times daily between meals.   flintstones complete 60 MG chewable tablet Chew  1 tablet by mouth daily.   ipratropium 0.02 % nebulizer solution Commonly known as:  ATROVENT Take 2.5 mLs (0.5 mg total) by nebulization every 4 (four) hours as needed for wheezing or shortness of breath.   latanoprost 0.005 % ophthalmic solution Commonly known as:  XALATAN Place 1 drop into both eyes at bedtime.   LORazepam 0.5 MG tablet Commonly known as:  ATIVAN Take 1 tablet (0.5 mg total) by mouth every 8 (eight) hours as needed for anxiety. What changed:  when to take this  reasons to take this   meclizine 25 MG tablet Commonly known as:  ANTIVERT Take 12.5 mg by mouth 3 (three) times daily as needed for dizziness.   pantoprazole 40 MG tablet Commonly known as:  PROTONIX Take 40 mg by mouth daily.   polyethylene glycol packet Commonly known as:  MIRALAX / GLYCOLAX Take 17 g by mouth daily as needed for moderate constipation.   potassium chloride SA 20 MEQ tablet Commonly known as:  K-DUR,KLOR-CON TAKE ONE TABLET BY MOUTH EACH EVENING   saccharomyces boulardii 250 MG capsule Commonly known as:  FLORASTOR Take 1 capsule (250 mg total) by mouth 2 (two) times daily.   SIMBRINZA 1-0.2 % Susp Generic drug:  Brinzolamide-Brimonidine Place 1 drop into both eyes daily.   sucralfate 1 GM/10ML suspension Commonly known as:  CARAFATE Take 10 mLs (1 g total) by mouth 4 (four) times daily -  with meals and at bedtime.   torsemide 20 MG tablet Commonly known as:  DEMADEX Take 1.5 tablets (30 mg total) by mouth daily. What changed:  when to take this  reasons to take this   traMADol 50 MG tablet Commonly known as:  ULTRAM Take 1 tablet (50 mg total) by mouth every 6 (six) hours as needed for moderate pain.   traZODone 50 MG tablet Commonly known as:  DESYREL Take 50 mg by mouth at bedtime.   warfarin 5 MG tablet Commonly known as:  COUMADIN Take 2.5-5 mg by mouth See admin instructions. Take 1/2 tablet on Friday then take 1 tablet all other days What changed:   Another medication with the same name was removed. Continue taking this medication, and follow the directions you see here.       Contact information for follow-up providers    [name] Marble, MD. Schedule an appointment as soon as possible for a visit in 2 week(s).   Specialty:  Internal Medicine Why:  Hospital Follow Up  Contact information: [place] [address] [phone]            Contact information for after-discharge care    Destination    [facility] .   Specialty:  [place] information: [place] [address] [phone]                 Allergies  Allergen Reactions  . Fluticasone-Salmeterol Anaphylaxis  .  Furosemide Shortness Of Breath  . Iodine Other (See Comments)    Reaction:  Fever and chills   . Iohexol Other (See Comments)    Desc:  [name] requires a 13 hr prep, when doing exam with contrast....fever, chills, dyspnea (slg/04/27/09--given 2 hour solumedrol/ benadryl prep--no reaction).    . Sulfonamide Derivatives Nausea And Vomiting  . Verapamil Nausea And Vomiting and Other (See Comments)    Reaction:  Loss of appetite   . Macrobid [Nitrofurantoin Monohyd Macro] Nausea Only and Other (See Comments)    Severe stomach pain  . Latex Rash  . Penicillins Rash and Other (See Comments)    [name] unable to answer additional questions about this medication.   Has patient had a PCN reaction causing immediate rash, facial/tongue/throat swelling, SOB or lightheadedness with hypotension: Yes Has patient had a PCN reaction causing severe rash involving mucus membranes or skin necrosis: UnknownHas patient had a PCN reaction that required hospitalization NoHas patient had a PCN reaction occurring within the last 10 years: No If all of the above answers are "NO", then may proceed with Cephalosporin     Procedures/Studies: Dg Knee 1-2 Views Left  Result Date: 04/21/2017 CLINICAL  DATA:  Knee swelling EXAM: LEFT KNEE - 1-2 VIEW COMPARISON:  None. FINDINGS: Osteopenia. Severe degenerative change. Large suprapatellar joint effusion. Vascular calcifications. No definite acute fracture. IMPRESSION: Degenerative change and joint effusion. Electronically Signed   By: Marybelle Killings M.D.   On: 04/21/2017 18:00   Ct Angio Chest Pe W/cm &/or Wo Cm  Result Date: 04/15/2017 CLINICAL DATA:  Hemoptysis, respiratory distress and hypoxia. EXAM: CT ANGIOGRAPHY CHEST WITH CONTRAST TECHNIQUE: Multidetector CT imaging of the chest was performed using the standard protocol during bolus administration of intravenous contrast. Multiplanar CT image reconstructions and MIPs were obtained to evaluate the vascular anatomy. CONTRAST:  100 mL Isovue 370 COMPARISON:  Chest radiograph 04/15/2017.  Chest CT 06/07/2016 FINDINGS: Cardiovascular: No evidence for a large pulmonary emboli. Evaluation of the right lower lobe pulmonary arteries is very limited due to extensive parenchymal disease in this area. Difficult to exclude small segmental emboli in this area. Again noted is aneurysm of the distal aortic arch and proximal descending thoracic aorta. The aneurysm now measures up to 5.6 cm, previously measured 5.3 cm. There is a prosthetic aortic valve. Atherosclerotic disease throughout the thoracic aorta. The mid descending thoracic aorta measures 4.9 cm and stable. Distal descending thoracic aorta measures 4.3 cm and stable. Pacer lead in the right ventricle. Mediastinum/Nodes: No significant chest lymphadenopathy. No large pericardial effusion. Lungs/Pleura: The trachea is patent. There is material within the bronchus intermedius and within the right lower lobe airways. There is also material within left lower lobe airways. Findings concerning for aspiration. There is extensive airspace disease throughout the right lower lobe and posterior right upper lobe. There is some airspace disease in the right middle lobe.  Scattered areas of airspace disease in the left lower lobe. In addition, there is extensive pleural-based consolidation throughout the posteromedial aspect of the right lower lobe. This is not compatible with pleural fluid. There may be small areas of lung necrosis within this pleural-based consolidation. No large pleural effusions. Upper Abdomen: Multiple hepatic cysts. Contrast refluxing into the hepatic veins. There may be left adrenal gland thickening which is similar to the previous examination. No acute abnormality in the upper abdomen. Musculoskeletal: No acute bone abnormality. Review of the MIP images confirms the above findings. IMPRESSION: Extensive airspace disease throughout both lungs, most prominent in  the right lower lobe. Findings are most compatible with multifocal pneumonia. Lobulated pleural-based consolidation along the posteromedial right lung with small areas of lung necrosis. Multiple endobronchial filling defects. Findings are suggestive for aspiration. Negative for large pulmonary emboli. Evaluation of the right lower lobar pulmonary arteries is limited due to extensive airspace disease in this area. Mild enlargement of the large aneurysm involving the posterior aortic arch and proximal descending thoracic aorta. The thoracic aortic aneurysm now measures up to 5.6 cm. No evidence for an acute rupture. These results will be called to the ordering clinician or representative by the Radiologist Assistant, and communication documented in the PACS or zVision Dashboard. Electronically Signed   By: Markus Daft M.D.   On: 04/15/2017 20:54   Dg Chest Port 1 View  Result Date: 04/15/2017 CLINICAL DATA:  Dyspnea, hemoptysis EXAM: PORTABLE CHEST 1 VIEW COMPARISON:  06/07/2016 chest radiograph. FINDINGS: Intact sternotomy wires. Stable aortic valve prosthesis. Single lead left subclavian pacemaker is stable in configuration. Stable mild cardiomegaly. Aortic atherosclerosis. Prominence of the aortic  arch contour, which appears mildly increased in the interval. Otherwise stable and normal mediastinal contour. No pneumothorax. No pleural effusion. Surgical sutures overlie the medial right upper lung. No overt pulmonary edema. Mild bibasilar scarring versus atelectasis. No acute consolidative airspace disease. Stable mild peripheral basilar right pleural thickening. IMPRESSION: 1. Stable mild cardiomegaly without overt pulmonary edema . 2. Aortic atherosclerosis. Prominence of the aortic arch contour, which appears increased in the interval, cannot exclude growth of the known thoracic aortic aneurysm. Suggest correlation with chest CT angiogram with IV contrast. 3. Mild bibasilar scarring versus atelectasis. Electronically Signed   By: Ilona Sorrel M.D.   On: 04/15/2017 14:30   Dg Swallowing Func-speech Pathology  Result Date: 04/16/2017 Objective Swallowing Evaluation: Type of Study: MBS-Modified Barium Swallow Study Patient Details Name: [name] MRN: [id number] Date of Birth: 01/21/1922 Today's Date: 04/16/2017 Time: SLP Start Time (ACUTE ONLY): 1139-SLP Stop Time (ACUTE ONLY): 1202 SLP Time Calculation (min) (ACUTE ONLY): 23 min Past Medical History: Past Medical History: Diagnosis Date . AAA (abdominal aortic aneurysm) ([place])   a. 12/2011 U/S: 3.3 cm prox AAA. [name] Anxiety  . Aortic insufficiency   a. S/P bioprosthetic AVR;  b. 02/2013 Echo: EF 50-55%, mild to mod AI (mean grad 14mmHg). . Brain aneurysm   Hx of . Chest pain   a. H/o nonobs cath;  b. 07/2013 Lexiscan MV: EF 66%, no ischemia. . Chronic diastolic CHF (congestive heart failure) ([place])   a. 02/2013 Echo: EF 50-55%. . Diverticulosis  . Gastritis  . Hemorrhoids  . Hepatitis B 1980's . Hiatal hernia  . Hypertension  . Moderate mitral regurgitation   a. 02/2013 Echo: Mod MR. . Moderate tricuspid regurgitation   a. 02/2013 Echo: Mod TR. [name] Permanent atrial fibrillation ([place])   a. Chronic coumadin. . Polymyalgia rheumatica ([place])  . Pulmonary hypertension  ([place])   a. 02/2013 Echo: PASP 34mmHg. . S/P aortic valve replacement with bioprosthetic valve   a. [facility]. . S/P placement of cardiac pacemaker   a.  08/2007 s/p MDT [place] PPM. . Seizures ([place])  . Shingles  . Skin cancer   face, ear  and left hand. . Tachy-brady syndrome ([place])   a. Severe in the setting of permanent atrial fibrillation with tachybrady syndrome;  b. 08/2007 s/p MDT [place] PPM. . Thoracic aneurysm   a. 10/2011 CTA: 5.0 x 3.8 cm. Past Surgical History: Past Surgical History: Procedure Laterality Date .  ABDOMINAL AORTIC ANEURYSM REPAIR    Resection . AORTIC VALVE REPLACEMENT    With bioprosthetic valve . APPENDECTOMY   . BREAST BIOPSY   . CEREBRAL ANEURYSM REPAIR   . CHOLECYSTECTOMY   . EP IMPLANTABLE DEVICE N/A 08/20/2016  Procedure: PPM Generator Changeout;  Surgeon: Deboraha Sprang, MD;  Location: [facility];  Service: Cardiovascular;  Laterality: N/A; . LUNG BIOPSY  2005 . PACEMAKER PLACEMENT  08/2007  [employer]; AF permanent; s/p pacemaker for bradycardia with now well controlled ventricular response; Digoxin therapy, question level; Lower extremity venous issues . skin cancer removal    ear, face and hand . VESICOVAGINAL FISTULA CLOSURE W/ TAH   HPI: 83 y.o.femalewith history of tachybradycardia syndrome status post pacemaker placement, permanent atrial fibrillation, bioprosthetic aortic valve replacement, abdominal aortic aneurysm status post repair, diastolic CHF, hiatal hernia,  was brought to the ER after patient was having shortness of breath with hemoptysis. Patient had gone yesterday for CAT scan of the abdomen and pelvis since patient was having mid back pain radiating to the front for last few weeks, developed cough/hemoptysis afterwards. Initially placed in [place]. CT showed multifocal PNA with possible aspiration and necrosis. Patient seen by SLP 05/12/16 with coughing on thin liquids at bedside thought due to oral candidasis, placed on nectar thick  liquid with planned upgrade at bedside.  No Data Recorded Assessment / Plan / Recommendation CHL IP CLINICAL IMPRESSIONS 04/16/2017 Clinical Impression Patient presents with a mild pharyngoesophageal dysphagia characterized by mild and intermittently delayed swallow initiation, tight UES, and decreased laryngeal closure,  resulting in penetration to the level of the vocal cords with thin liquids inconsistently both before and during the swallow. Penetration trace and clears with cued throat clear. Mild CP residuals additionally clear with spontaneous dry swallow. Recommend continuation of regular solids, thin liquids with aspiration precautions/compensatory strategies to minimize aspiration risk.  SLP Visit Diagnosis Dysphagia, pharyngoesophageal phase (R13.14) Attention and concentration deficit following -- Frontal lobe and executive function deficit following -- Impact on safety and function Mild aspiration risk   CHL IP TREATMENT RECOMMENDATION 04/16/2017 Treatment Recommendations Therapy as outlined in treatment plan below   Prognosis 05/12/2016 Prognosis for Safe Diet Advancement Fair Barriers to Reach Goals Time post onset Barriers/Prognosis Comment -- CHL IP DIET RECOMMENDATION 04/16/2017 SLP Diet Recommendations Regular solids;Thin liquid Liquid Administration via Cup;Straw Medication Administration Whole meds with liquid Compensations Slow rate;Small sips/bites;Clear throat intermittently;Follow solids with liquid Postural Changes Seated upright at 90 degrees;Remain semi-upright after after feeds/meals (Comment)   CHL IP OTHER RECOMMENDATIONS 04/16/2017 Recommended Consults -- Oral Care Recommendations Oral care BID Other Recommendations --   CHL IP FOLLOW UP RECOMMENDATIONS 04/16/2017 Follow up Recommendations None   CHL IP FREQUENCY AND DURATION 04/16/2017 Speech Therapy Frequency (ACUTE ONLY) min 1 x/week Treatment Duration 1 week      CHL IP ORAL PHASE 04/16/2017 Oral Phase WFL Oral - Pudding Teaspoon -- Oral  - Pudding Cup -- Oral - Honey Teaspoon -- Oral - Honey Cup -- Oral - Nectar Teaspoon -- Oral - Nectar Cup -- Oral - Nectar Straw -- Oral - Thin Teaspoon -- Oral - Thin Cup -- Oral - Thin Straw -- Oral - Puree -- Oral - Mech Soft -- Oral - Regular -- Oral - Multi-Consistency -- Oral - Pill -- Oral Phase - Comment --  CHL IP PHARYNGEAL PHASE 04/16/2017 Pharyngeal Phase Impaired Pharyngeal- Pudding Teaspoon -- Pharyngeal -- Pharyngeal- Pudding Cup -- Pharyngeal -- Pharyngeal- Honey Teaspoon -- Pharyngeal -- Pharyngeal- Honey Cup --  Pharyngeal -- Pharyngeal- Nectar Teaspoon -- Pharyngeal -- Pharyngeal- Nectar Cup Pharyngeal residue - cp segment Pharyngeal -- Pharyngeal- Nectar Straw -- Pharyngeal -- Pharyngeal- Thin Teaspoon -- Pharyngeal -- Pharyngeal- Thin Cup Pharyngeal residue - cp segment;Penetration/Aspiration during swallow;Delayed swallow initiation-pyriform sinuses;Penetration/Aspiration before swallow Pharyngeal Material enters airway, CONTACTS cords and not ejected out Pharyngeal- Thin Straw Pharyngeal residue - cp segment;Penetration/Aspiration during swallow Pharyngeal Material enters airway, CONTACTS cords and not ejected out Pharyngeal- Puree Pharyngeal residue - cp segment Pharyngeal -- Pharyngeal- Mechanical Soft Pharyngeal residue - cp segment Pharyngeal -- Pharyngeal- Regular -- Pharyngeal -- Pharyngeal- Multi-consistency -- Pharyngeal -- Pharyngeal- Pill -- Pharyngeal -- Pharyngeal Comment --  CHL IP CERVICAL ESOPHAGEAL PHASE 04/16/2017 Cervical Esophageal Phase Impaired Pudding Teaspoon -- Pudding Cup -- Honey Teaspoon -- Honey Cup -- Nectar Teaspoon -- Nectar Cup -- Nectar Straw -- Thin Teaspoon -- Thin Cup -- Thin Straw -- Puree -- Mechanical Soft -- Regular -- Multi-consistency -- Pill -- Cervical Esophageal Comment tight UES CHL IP GO 05/12/2016 Functional Assessment Tool Used clinical judgement Functional Limitations Swallowing Swallow Current Status (J5009) CJ Swallow Goal Status (F8182) CI  Swallow Discharge Status (X9371) (None) Motor Speech Current Status (I9678) (None) Motor Speech Goal Status (L3810) (None) Motor Speech Goal Status (F7510) (None) Spoken Language Comprehension Current Status (C5852) (None) Spoken Language Comprehension Goal Status (D7824) (None) Spoken Language Comprehension Discharge Status (M3536) (None) Spoken Language Expression Current Status (R4431) (None) Spoken Language Expression Goal Status (V4008) (None) Spoken Language Expression Discharge Status [phone]) (None) Attention Current Status (J0932) (None) Attention Goal Status (I7124) (None) Attention Discharge Status (P8099) (None) Memory Current Status (I3382) (None) Memory Goal Status (N0539) (None) Memory Discharge Status (J6734) (None) Voice Current Status (L9379) (None) Voice Goal Status (K2409) (None) Voice Discharge Status (B3532) (None) Other Speech-Language Pathology Functional Limitation Current Status (D9242) (None) Other Speech-Language Pathology Functional Limitation Goal Status (A8341) (None) Other Speech-Language Pathology Functional Limitation Discharge Status [phone]) (None) Gabriel Rainwater MA, CCC-SLP [phone] [place] Leah Meryl 04/16/2017, 1:20 PM                Subjective: [name] says she is feeling much better.  No SOB.  No hemoptysis.    Discharge Exam: Vitals:   04/23/17 0545 04/23/17 0943  BP: (!) 118/35 (!) 113/42  Pulse: 72 80  Resp: 18 17  Temp: 97.7 F (36.5 C)    Vitals:   04/22/17 2202 04/23/17 0545 04/23/17 0824 04/23/17 0943  BP: 135/63 (!) 118/35  (!) 113/42  Pulse: 79 72  80  Resp: 18 18  17   Temp: 98 F (36.7 C) 97.7 F (36.5 C)    TempSrc: Oral Oral    SpO2: 100% 100% 99% 96%  Weight:  68 kg (150 lb)    Height:       General: [name] is alert, awake, not in acute distress Cardiovascular: RRR, S1/S2 +, no rubs, no gallops Respiratory: CTA bilaterally, no wheezing, no rhonchi Abdominal: Soft, NT, ND, bowel sounds + Extremities: severe arthritic changes,  no edema, no  cyanosis Neuro: nonfocal.    The results of significant diagnostics from this hospitalization (including imaging, microbiology, ancillary and laboratory) are listed below for reference.     Microbiology: Recent Results (from the past 240 hour(s))  Urine culture     Status: None   Collection Time: 04/15/17  3:39 PM  Result Value Ref Range Status   Specimen Description URINE, CLEAN CATCH  Final   Special Requests Normal  Final   Culture NO GROWTH  Final  Report Status 04/16/2017 FINAL  Final  Culture, sputum-assessment     Status: None   Collection Time: 04/15/17 11:24 PM  Result Value Ref Range Status   Specimen Description EXPECTORATED SPUTUM  Final   Special Requests NONE  Final   Sputum evaluation THIS SPECIMEN IS ACCEPTABLE FOR SPUTUM CULTURE  Final   Report Status 04/18/2017 FINAL  Final  Culture, respiratory (NON-Expectorated)     Status: None   Collection Time: 04/15/17 11:24 PM  Result Value Ref Range Status   Specimen Description EXPECTORATED SPUTUM  Final   Special Requests NONE Reflexed from [id number]  Final   Gram Stain   Final    ABUNDANT WBC PRESENT,BOTH PMN AND MONONUCLEAR NO ORGANISMS SEEN    Culture Consistent with normal respiratory flora.  Final   Report Status 04/20/2017 FINAL  Final  Culture, expectorated sputum-assessment     Status: None   Collection Time: 04/20/17 10:31 AM  Result Value Ref Range Status   Specimen Description EXPECTORATED SPUTUM  Final   Special Requests NONE  Final   Sputum evaluation THIS SPECIMEN IS ACCEPTABLE FOR SPUTUM CULTURE  Final   Report Status 04/20/2017 FINAL  Final  Culture, respiratory (NON-Expectorated)     Status: None   Collection Time: 04/20/17 10:31 AM  Result Value Ref Range Status   Specimen Description EXPECTORATED SPUTUM  Final   Special Requests NONE Reflexed from [id number]  Final   Gram Stain   Final    MODERATE WBC PRESENT,BOTH PMN AND MONONUCLEAR NO ORGANISMS SEEN    Culture Consistent with normal  respiratory flora.  Final   Report Status 04/22/2017 FINAL  Final     Labs: BNP (last 3 results)  Recent Labs  05/10/16 1354 04/15/17 1410  BNP 216.8* 956.2*   Basic Metabolic Panel:  Recent Labs Lab 04/19/17 1020 04/20/17 0235 04/21/17 0451  NA 141 139 138  K 3.5 3.6 4.1  CL 100* 98* 94*  CO2 32 36* 33*  GLUCOSE 122* 116* 140*  BUN 16 23* 19  CREATININE 0.85 0.78 0.89  CALCIUM 8.1* 8.0* 8.1*   Liver Function Tests: No results for input(s): AST, ALT, ALKPHOS, BILITOT, PROT, ALBUMIN in the last 168 hours. No results for input(s): LIPASE, AMYLASE in the last 168 hours. No results for input(s): AMMONIA in the last 168 hours. CBC:  Recent Labs Lab 04/18/17 0328 04/19/17 0301 04/20/17 0235 04/21/17 0451 04/22/17 0408  WBC 4.8 5.7 5.0 7.1 6.1  HGB 8.6* 9.2* 9.1* 9.2* 9.0*  HCT 27.0* 28.5* 28.6* 29.3* 28.8*  MCV 92.8 92.2 93.8 93.9 95.0  PLT 87* 111* 132* 134* 133*   Cardiac Enzymes: No results for input(s): CKTOTAL, CKMB, CKMBINDEX, TROPONINI in the last 168 hours. BNP: Invalid input(s): POCBNP CBG: No results for input(s): GLUCAP in the last 168 hours. D-Dimer No results for input(s): DDIMER in the last 72 hours. Hgb A1c No results for input(s): HGBA1C in the last 72 hours. Lipid Profile No results for input(s): CHOL, HDL, LDLCALC, TRIG, CHOLHDL, LDLDIRECT in the last 72 hours. Thyroid function studies No results for input(s): TSH, T4TOTAL, T3FREE, THYROIDAB in the last 72 hours.  Invalid input(s): FREET3 Anemia work up No results for input(s): VITAMINB12, FOLATE, FERRITIN, TIBC, IRON, RETICCTPCT in the last 72 hours. Urinalysis    Component Value Date/Time   COLORURINE YELLOW 04/15/2017 1539   APPEARANCEUR CLEAR 04/15/2017 1539   LABSPEC >1.046 (H) 04/15/2017 1539   PHURINE 5.0 04/15/2017 1539   GLUCOSEU NEGATIVE 04/15/2017 1539   HGBUR  NEGATIVE 04/15/2017 1539   BILIRUBINUR NEGATIVE 04/15/2017 1539   KETONESUR NEGATIVE 04/15/2017 1539    PROTEINUR NEGATIVE 04/15/2017 1539   UROBILINOGEN 1.0 08/01/2015 2030   NITRITE NEGATIVE 04/15/2017 1539   LEUKOCYTESUR NEGATIVE 04/15/2017 1539   Sepsis Labs Invalid input(s): PROCALCITONIN,  WBC,  LACTICIDVEN Microbiology Recent Results (from the past 240 hour(s))  Urine culture     Status: None   Collection Time: 04/15/17  3:39 PM  Result Value Ref Range Status   Specimen Description URINE, CLEAN CATCH  Final   Special Requests Normal  Final   Culture NO GROWTH  Final   Report Status 04/16/2017 FINAL  Final  Culture, sputum-assessment     Status: None   Collection Time: 04/15/17 11:24 PM  Result Value Ref Range Status   Specimen Description EXPECTORATED SPUTUM  Final   Special Requests NONE  Final   Sputum evaluation THIS SPECIMEN IS ACCEPTABLE FOR SPUTUM CULTURE  Final   Report Status 04/18/2017 FINAL  Final  Culture, respiratory (NON-Expectorated)     Status: None   Collection Time: 04/15/17 11:24 PM  Result Value Ref Range Status   Specimen Description EXPECTORATED SPUTUM  Final   Special Requests NONE Reflexed from [id number]  Final   Gram Stain   Final    ABUNDANT WBC PRESENT,BOTH PMN AND MONONUCLEAR NO ORGANISMS SEEN    Culture Consistent with normal respiratory flora.  Final   Report Status 04/20/2017 FINAL  Final  Culture, expectorated sputum-assessment     Status: None   Collection Time: 04/20/17 10:31 AM  Result Value Ref Range Status   Specimen Description EXPECTORATED SPUTUM  Final   Special Requests NONE  Final   Sputum evaluation THIS SPECIMEN IS ACCEPTABLE FOR SPUTUM CULTURE  Final   Report Status 04/20/2017 FINAL  Final  Culture, respiratory (NON-Expectorated)     Status: None   Collection Time: 04/20/17 10:31 AM  Result Value Ref Range Status   Specimen Description EXPECTORATED SPUTUM  Final   Special Requests NONE Reflexed from [id number]  Final   Gram Stain   Final    MODERATE WBC PRESENT,BOTH PMN AND MONONUCLEAR NO ORGANISMS SEEN    Culture Consistent  with normal respiratory flora.  Final   Report Status 04/22/2017 FINAL  Final   Time coordinating discharge: 40 mins  SIGNED:  Irwin Brakeman, MD  [facility] 04/23/2017, 10:35 AM Pager [phone]  If 7PM-7AM, please contact night-coverage [email or web address] Password [email or web address]

## 2017-04-23 NOTE — Clinical Social Work Note (Signed)
Per patient's niece [name], first preference SNF is [facility] and second is [facility]. CSW has spoken with [facility] admissions coordinator and they can extend a bed offer. Patient will be in a semi-private room as this is all that is available but no one is currently sharing the room. They stated there is only a 10% chance patient would end up getting a roommate. Patient is agreeable to this. CSW left voicemail for patient's niece.   Patient's nieces concerned about patient not being at dry weight of "a maximum of 143 lbs." They are concerned because they state that patient gets short of breath even with that weight. CSW paged MD to make him aware of concerns.  [name], [place]

## 2017-04-24 DIAGNOSIS — F39 Unspecified mood [affective] disorder: Secondary | ICD-10-CM | POA: Diagnosis not present

## 2017-04-24 DIAGNOSIS — I5032 Chronic diastolic (congestive) heart failure: Secondary | ICD-10-CM | POA: Diagnosis not present

## 2017-04-24 DIAGNOSIS — K219 Gastro-esophageal reflux disease without esophagitis: Secondary | ICD-10-CM | POA: Diagnosis not present

## 2017-04-24 DIAGNOSIS — I270 Primary pulmonary hypertension: Secondary | ICD-10-CM | POA: Diagnosis not present

## 2017-04-24 DIAGNOSIS — I714 Abdominal aortic aneurysm, without rupture: Secondary | ICD-10-CM | POA: Diagnosis not present

## 2017-04-24 DIAGNOSIS — I482 Chronic atrial fibrillation: Secondary | ICD-10-CM | POA: Diagnosis not present

## 2017-04-24 DIAGNOSIS — J690 Pneumonitis due to inhalation of food and vomit: Secondary | ICD-10-CM | POA: Diagnosis not present

## 2017-05-27 ENCOUNTER — Telehealth: Payer: Self-pay | Admitting: Cardiovascular Disease

## 2017-05-27 NOTE — Telephone Encounter (Signed)
Patient needs inr checked but unable to transport to office .  Can Dr. Rockey Situ order this to be done with a home health agency ? Patient is currently being assisted with [facility]

## 2017-05-27 NOTE — Telephone Encounter (Signed)
I called [facility] and they are only following pt for PT/OT. Since they are not providing any other skilled nursing needs, they are unable to check INRs for patient at home. Unfortunately pt will need to come into the office for INR checks still.

## 2017-05-27 NOTE — Telephone Encounter (Signed)
Spoke with patients daughter and she reports that her mother is unable to get out at this time and is receiving physical and occupational therapies in the home by [facility]. She states that this is a short term situation until she is finished with the therapies and just wanted to see if this INR check could be arranged through them. Let her know I am not familiar with INR home checks but that I would send message to our coumadin clinic and she might get a call from them. She was appreciative for the call back and had no further questions at this time.

## 2017-05-27 NOTE — Telephone Encounter (Signed)
Left voicemail message for patients daughter to call back to discuss testing.

## 2017-05-28 NOTE — Telephone Encounter (Signed)
Spoke with patients daughter to let her know that [facility] is not able to do the checks. She reports that she also found this out and states that [facility] will be able to do these. Let her know to please give [place] a call if we can assist in anyway. She was appreciative for the information and had no further questions at this time.

## 2017-05-31 NOTE — Progress Notes (Signed)
Cardiology Office Note  Date:  06/03/2017   ID:  [name], DOB 1922-05-06, MRN [id number]  PCP:  Jodi Marble, MD   Chief Complaint  Patient presents with  . other    6 month f/u c/o swelling knees and low BP. Meds reviewed verbally with pt.    HPI:  83 yo WF with history of  tachy-brady syndrome s/p pacemaker,  Permanent atrial fibrillation on warfarin,  HTN,  bioprosthetic AVR seen on echo June 2017,  AAA with repair 4-5 years ago,  chronic diastolic heart failure,   right brachial arterial stenosis,  TIA in 2012,   No significant coronary artery disease or carotid arterial disease Echocardiogram in 2012 showing moderate MR and TR with moderate pulmonary hypertension  significant peripheral vascular disease, Thoracic aortic aneurysm followed by Dr. Donnetta Hutching.  Treated with chronic prednisone for polymyalgia presenting for routine followup of her chronic diastolic CHF, aneurysm and atrial fibrillation  Recent hospitalization June 2018 for hemoptysis, shortness of breath Hospital records reviewed with the patient in detail CT scan showing multifocal pneumonia, possible aspiration, started on antibiotics, BIPAP Possible necrosis Aspiration precautions recommended  S/P AAA repair/distal aortic arch aneurysm Continue blood pressure control. 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 The mid descending thoracic aorta measures 4.9 cm and is stable. Distal descending thoracic aorta measures 4.3 cm and is stable.    She is weak following recent hospitalization, spent 2 weeks at [facility] Weight down slightly, eating last Blood pressure borderline low at home Daughter presents with her today, concerned about episodes of hypoxia into the 80s Periodic dizziness Slowly getting stronger, using a walker Has PT 3 days per week at home  She was taking torsemide 30 mg at [facility] [place], only taking 20 mg daily at home Worsening leg swelling, but has been sitting more, maybe drinking  more Weight at her baseline 140 pounds  Very few social activities Prior history of chronic nausea  EKG on today's visit shows atrial fibrillation, ventricularly paced rhythm rate of 76 bpm  Other past medical history reviewed CT 05/2016 Thoracic aortic aneurysm, measuring 5.3 cm at the level of the proximal descending thoracic aorta, previously 4.5 cm  Echocardiogram April 2016 with significant pulmonary hypertension.  pneumonia in October 2015.   chronic knee pain Some shortness of breath but is not very active at baseline  History of severe COPD exacerbation, bronchitis. She was on Z-Pak, prednisone, started on albuterol inhaler She took several days of diuretics, torsemide,  with mild weight loss now down to 140 pounds. No significant edema. Symptoms improved  Previous Evaluation of her pacemaker report shows she is 60% RV paced  admitted to the hospital at [facility] on Apr 16 2011 with shortness of breath, acute on chronic diastolic heart failure, underlying hypertension.  possible  pneumonia. White blood cell count was 13. Antibiotics were discontinued after her chest x-ray cleared.  Numerous skin cancers over the past several years, on her legs, face, arms requiring resection History of arthritis in her knees requiring cortisone shot  cardiac catheterization over 5 years ago showing no significant coronary artery disease, recent CT scan showing stable descending aorta and after repair, minimal dilatation of the descending aorta, stable-appearing aortic valve.   PMH:   has a past medical history of AAA (abdominal aortic aneurysm) ([place]); Anxiety; Aortic insufficiency; Brain aneurysm; Chest pain; Chronic diastolic CHF (congestive heart failure) ([place]); Diverticulosis; Gastritis; Hemorrhoids; Hepatitis B (1980's); Hiatal hernia; Hypertension; Moderate mitral regurgitation; Moderate tricuspid regurgitation; Permanent atrial  fibrillation ([place]); Polymyalgia rheumatica ([place]);  Pulmonary hypertension ([facility]); S/P aortic valve replacement with bioprosthetic valve; S/P placement of cardiac pacemaker; Seizures ([place]); Shingles; Skin cancer; Tachy-brady syndrome ([place]); and Thoracic aneurysm.  PSH:    Past Surgical History:  Procedure Laterality Date  . ABDOMINAL AORTIC ANEURYSM REPAIR     Resection  . AORTIC VALVE REPLACEMENT     With bioprosthetic valve  . APPENDECTOMY    . BREAST BIOPSY    . CEREBRAL ANEURYSM REPAIR    . CHOLECYSTECTOMY    . EP IMPLANTABLE DEVICE N/A 08/20/2016   Procedure: PPM Generator Changeout;  Surgeon: Deboraha Sprang, MD;  Location: [facility];  Service: Cardiovascular;  Laterality: N/A;  . LUNG BIOPSY  2005  . PACEMAKER PLACEMENT  08/2007   [employer]; AF permanent; s/p pacemaker for bradycardia with now well controlled ventricular response; Digoxin therapy, question level; Lower extremity venous issues  . skin cancer removal     ear, face and hand  . VESICOVAGINAL FISTULA CLOSURE W/ TAH      Current Outpatient Prescriptions  Medication Sig Dispense Refill  . acetaminophen (TYLENOL) 500 MG tablet Take 1 tablet (500 mg total) by mouth every 6 (six) hours. 30 tablet 0  . albuterol (PROVENTIL HFA;VENTOLIN HFA) 108 (90 Base) MCG/ACT inhaler Inhale 1 puff into the lungs every 6 (six) hours as needed for wheezing or shortness of breath.    . Brinzolamide-Brimonidine (SIMBRINZA) 1-0.2 % SUSP Place 1 drop into both eyes daily.     . busPIRone (BUSPAR) 7.5 MG tablet Take 7.5 mg by mouth 2 (two) times daily.    . diclofenac sodium (VOLTAREN) 1 % GEL Apply 4 g topically 2 (two) times daily.    . flintstones complete (FLINTSTONES) 60 MG chewable tablet Chew 1 tablet by mouth daily.    [name] ipratropium (ATROVENT) 0.02 % nebulizer solution Take 2.5 mLs (0.5 mg total) by nebulization every 4 (four) hours as needed for wheezing or shortness of breath. 75 mL 12  . latanoprost (XALATAN) 0.005 % ophthalmic solution Place 1 drop into both eyes at bedtime.     [name] LORazepam (ATIVAN) 0.5 MG tablet Take 1 tablet (0.5 mg total) by mouth every 8 (eight) hours as needed for anxiety. 12 tablet 0  . meclizine (ANTIVERT) 25 MG tablet Take 12.5 mg by mouth 3 (three) times daily as needed for dizziness.    . pantoprazole (PROTONIX) 40 MG tablet Take 40 mg by mouth daily.     . polyethylene glycol (MIRALAX / GLYCOLAX) packet Take 17 g by mouth daily as needed for moderate constipation. (Patient taking differently: Take 17 g by mouth daily. ) 14 each 0  . potassium chloride SA (K-DUR,KLOR-CON) 20 MEQ tablet TAKE ONE TABLET BY MOUTH EACH EVENING 90 tablet 3  . torsemide (DEMADEX) 20 MG tablet Take 1.5 tablets (30 mg total) by mouth daily. (Patient taking differently: Take 20 mg by mouth daily. )    . traMADol (ULTRAM) 50 MG tablet Take 1 tablet (50 mg total) by mouth every 6 (six) hours as needed for moderate pain. 30 tablet 0  . traZODone (DESYREL) 50 MG tablet Take 50 mg by mouth at bedtime.     [name] warfarin (COUMADIN) 5 MG tablet Take 2.5-5 mg by mouth See admin instructions. Take 1/2 tablet on Friday then take 1 tablet all other days     No current facility-administered medications for this visit.      Allergies:   Fluticasone-salmeterol; Furosemide; Iodine; Iohexol; Sulfonamide  derivatives; Verapamil; Macrobid [nitrofurantoin monohyd macro]; Latex; and Penicillins   Social History:  The patient  reports that she has never smoked. She has never used smokeless tobacco. She reports that she does not drink alcohol or use drugs.   Family History:   family history includes AAA (abdominal aortic aneurysm) in her sister; Cancer in her mother; Heart disease in her brother, father, mother, and sister; Hypertension in her brother, father, mother, and sister; Varicose Veins in her mother and sister.    Review of Systems: Review of Systems  Constitutional: Positive for malaise/fatigue.  Respiratory: Negative.   Cardiovascular: Negative.   Gastrointestinal: Negative.    Musculoskeletal: Negative.   Neurological: Positive for weakness.  Psychiatric/Behavioral: Negative.   All other systems reviewed and are negative.    PHYSICAL EXAM: VS:  BP 110/60 (BP Location: Left Wrist, Patient Position: Sitting, Cuff Size: Normal)   Pulse 76   Ht 5\' 3"  (1.6 m)   Wt 140 lb 6 oz (63.7 kg)   BMI 24.87 kg/m  , BMI Body mass index is 24.87 kg/m. GEN: Well nourished, well developed, in no acute distress, presenting a wheelchair  HEENT: normal  Neck: no JVD, carotid bruits, or masses Cardiac: RRR; no murmurs, rubs, or gallops,no edema  Respiratory:  clear to auscultation bilaterally, normal work of breathing GI: soft, nontender, nondistended, + BS MS: no deformity or atrophy  Skin: warm and dry, no rash Neuro:  Strength and sensation are intact Psych: euthymic mood, full affect    Recent Labs: 04/15/2017: B Natriuretic Peptide 190.9 04/21/2017: BUN 19; Creatinine, Ser 0.89; Potassium 4.1; Sodium 138 04/22/2017: Hemoglobin 9.0; Platelets 133      Wt Readings from Last 3 Encounters:  06/03/17 140 lb 6 oz (63.7 kg)  04/23/17 150 lb (68 kg)  04/14/17 141 lb (64 kg)       ASSESSMENT AND PLAN:  Atrial fibrillation, unspecified type ([place]) - Plan: EKG 12-Lead INR greater than 8 Order placed to check this in the hospital stat Instructed to stop the warfarin for now until she hears back from [place]  Thoracic aortic aneurysm without rupture ([place]) Periodic follow-up with vascular in [facility] She is not interested in surgery  Leg swelling Likely multifactorial including venous insufficiency  as well as diastolic CHF Recommended leg elevation, more movement, Ace wraps or compressions, increase torsemide as needed  DIASTOLIC HEART FAILURE, CHRONIC Weight 140 pounds, around her baseline  Recommended she continue torsemide 20 up to 30 mg daily  S/P AAA repair  seen on recent CT scan  Nausea Previously chronic issue  AVR, bioprosthetic evaluation by  echocardiogram June 2017 Functioning well, well-seated  Weakness  Baseline was poor, now after pneumonia she is weak Stressed importance of continued therapy with PT   Total encounter time more than 45 minutes  Greater than 50% was spent in counseling and coordination of care with the patient    Disposition:   F/U  6 months   Orders Placed This Encounter  Procedures  . EKG 12-Lead     Signed, Esmond Plants, M.D., Ph.D. 06/03/2017  [place] [phone]\

## 2017-06-03 ENCOUNTER — Encounter: Payer: Self-pay | Admitting: Cardiovascular Disease

## 2017-06-03 ENCOUNTER — Ambulatory Visit (INDEPENDENT_AMBULATORY_CARE_PROVIDER_SITE_OTHER): Payer: Medicare Other | Admitting: *Deleted

## 2017-06-03 ENCOUNTER — Ambulatory Visit (INDEPENDENT_AMBULATORY_CARE_PROVIDER_SITE_OTHER): Payer: Medicare Other | Admitting: Cardiovascular Disease

## 2017-06-03 ENCOUNTER — Other Ambulatory Visit
Admission: RE | Admit: 2017-06-03 | Discharge: 2017-06-03 | Disposition: A | Discharge status: Home / Self Care | Payer: Medicare Other | Source: Ambulatory Visit | Attending: Cardiovascular Disease | Admitting: Cardiovascular Disease

## 2017-06-03 VITALS — BP 110/60 | HR 76 | Ht 63.0 in | Wt 140.4 lb

## 2017-06-03 DIAGNOSIS — J9601 Acute respiratory failure with hypoxia: Secondary | ICD-10-CM

## 2017-06-03 DIAGNOSIS — I4821 Permanent atrial fibrillation: Secondary | ICD-10-CM

## 2017-06-03 DIAGNOSIS — I4891 Unspecified atrial fibrillation: Secondary | ICD-10-CM | POA: Diagnosis not present

## 2017-06-03 DIAGNOSIS — E782 Mixed hyperlipidemia: Secondary | ICD-10-CM | POA: Diagnosis not present

## 2017-06-03 DIAGNOSIS — R791 Abnormal coagulation profile: Secondary | ICD-10-CM | POA: Insufficient documentation

## 2017-06-03 DIAGNOSIS — Z5181 Encounter for therapeutic drug level monitoring: Secondary | ICD-10-CM

## 2017-06-03 DIAGNOSIS — Z953 Presence of xenogenic heart valve: Secondary | ICD-10-CM

## 2017-06-03 DIAGNOSIS — I482 Chronic atrial fibrillation: Secondary | ICD-10-CM | POA: Insufficient documentation

## 2017-06-03 DIAGNOSIS — I272 Pulmonary hypertension, unspecified: Secondary | ICD-10-CM

## 2017-06-03 DIAGNOSIS — I5032 Chronic diastolic (congestive) heart failure: Secondary | ICD-10-CM | POA: Diagnosis not present

## 2017-06-03 LAB — POCT INR

## 2017-06-03 LAB — PROTIME-INR
INR: 5.14
Prothrombin Time: 48.9 seconds — ABNORMAL HIGH (ref 11.4–15.2)

## 2017-06-03 NOTE — Patient Instructions (Signed)

## 2017-06-10 ENCOUNTER — Ambulatory Visit (INDEPENDENT_AMBULATORY_CARE_PROVIDER_SITE_OTHER): Payer: Medicare Other

## 2017-06-10 DIAGNOSIS — Z5181 Encounter for therapeutic drug level monitoring: Secondary | ICD-10-CM | POA: Diagnosis not present

## 2017-06-10 DIAGNOSIS — Z953 Presence of xenogenic heart valve: Secondary | ICD-10-CM | POA: Diagnosis not present

## 2017-06-10 DIAGNOSIS — I4891 Unspecified atrial fibrillation: Secondary | ICD-10-CM | POA: Diagnosis not present

## 2017-06-10 LAB — POCT INR: INR: 3.4

## 2017-06-24 ENCOUNTER — Ambulatory Visit (INDEPENDENT_AMBULATORY_CARE_PROVIDER_SITE_OTHER): Payer: Medicare Other | Admitting: *Deleted

## 2017-06-24 DIAGNOSIS — Z5181 Encounter for therapeutic drug level monitoring: Secondary | ICD-10-CM | POA: Diagnosis not present

## 2017-06-24 DIAGNOSIS — I4891 Unspecified atrial fibrillation: Secondary | ICD-10-CM | POA: Diagnosis not present

## 2017-06-24 DIAGNOSIS — Z953 Presence of xenogenic heart valve: Secondary | ICD-10-CM

## 2017-06-24 LAB — POCT INR: INR: 5.4

## 2017-07-06 ENCOUNTER — Other Ambulatory Visit: Payer: Self-pay | Admitting: Internal Medicine

## 2017-07-08 ENCOUNTER — Ambulatory Visit (INDEPENDENT_AMBULATORY_CARE_PROVIDER_SITE_OTHER): Payer: Medicare Other | Admitting: *Deleted

## 2017-07-08 DIAGNOSIS — Z5181 Encounter for therapeutic drug level monitoring: Secondary | ICD-10-CM | POA: Diagnosis not present

## 2017-07-08 DIAGNOSIS — I4891 Unspecified atrial fibrillation: Secondary | ICD-10-CM | POA: Diagnosis not present

## 2017-07-08 DIAGNOSIS — Z953 Presence of xenogenic heart valve: Secondary | ICD-10-CM | POA: Diagnosis not present

## 2017-07-08 LAB — POCT INR: INR: 1.6

## 2017-07-14 ENCOUNTER — Telehealth: Payer: Self-pay | Admitting: Cardiology

## 2017-07-14 ENCOUNTER — Ambulatory Visit (INDEPENDENT_AMBULATORY_CARE_PROVIDER_SITE_OTHER): Payer: Medicare Other | Admitting: *Deleted

## 2017-07-14 DIAGNOSIS — I495 Sick sinus syndrome: Secondary | ICD-10-CM

## 2017-07-14 NOTE — Telephone Encounter (Signed)
Confirmed remote transmission w/ pt niece.   

## 2017-07-15 LAB — CUP PACEART REMOTE DEVICE CHECK
Battery Impedance: 155 Ohm
Battery Remaining Longevity: 106 mo
Battery Voltage: 2.78 V
Date Time Interrogation Session: 20180829003832
Implantable Lead Implant Date: 20081003
Implantable Lead Location: 753860
Implantable Pulse Generator Implant Date: 20171004
Lead Channel Impedance Value: 0 Ohm
Lead Channel Pacing Threshold Pulse Width: 0.4 ms
Lead Channel Setting Sensing Sensitivity: 2.8 mV
MDC IDC MSMT LEADCHNL RV IMPEDANCE VALUE: 639 Ohm
MDC IDC MSMT LEADCHNL RV PACING THRESHOLD AMPLITUDE: 0.875 V
MDC IDC SET LEADCHNL RV PACING AMPLITUDE: 2.5 V
MDC IDC SET LEADCHNL RV PACING PULSEWIDTH: 0.4 ms
MDC IDC STAT BRADY RV PERCENT PACED: 100 %

## 2017-07-15 NOTE — Progress Notes (Signed)
Remote Transmission 

## 2017-07-16 ENCOUNTER — Ambulatory Visit (INDEPENDENT_AMBULATORY_CARE_PROVIDER_SITE_OTHER): Payer: Medicare Other | Admitting: Pharmacist

## 2017-07-16 DIAGNOSIS — Z5181 Encounter for therapeutic drug level monitoring: Secondary | ICD-10-CM

## 2017-07-16 DIAGNOSIS — I4891 Unspecified atrial fibrillation: Secondary | ICD-10-CM | POA: Diagnosis not present

## 2017-07-16 DIAGNOSIS — Z953 Presence of xenogenic heart valve: Secondary | ICD-10-CM | POA: Diagnosis not present

## 2017-07-16 LAB — POCT INR: INR: 1.9

## 2017-07-24 ENCOUNTER — Encounter: Payer: Self-pay | Admitting: Cardiology

## 2017-07-29 ENCOUNTER — Ambulatory Visit (INDEPENDENT_AMBULATORY_CARE_PROVIDER_SITE_OTHER): Payer: Medicare Other

## 2017-07-29 ENCOUNTER — Ambulatory Visit (INDEPENDENT_AMBULATORY_CARE_PROVIDER_SITE_OTHER): Payer: Medicare Other | Admitting: Podiatry

## 2017-07-29 DIAGNOSIS — B351 Tinea unguium: Secondary | ICD-10-CM

## 2017-07-29 DIAGNOSIS — M7752 Other enthesopathy of left foot: Secondary | ICD-10-CM

## 2017-07-29 DIAGNOSIS — M79676 Pain in unspecified toe(s): Secondary | ICD-10-CM

## 2017-07-29 DIAGNOSIS — F419 Anxiety disorder, unspecified: Secondary | ICD-10-CM | POA: Insufficient documentation

## 2017-07-29 DIAGNOSIS — I10 Essential (primary) hypertension: Secondary | ICD-10-CM | POA: Insufficient documentation

## 2017-07-29 DIAGNOSIS — M779 Enthesopathy, unspecified: Secondary | ICD-10-CM

## 2017-07-29 DIAGNOSIS — I4891 Unspecified atrial fibrillation: Secondary | ICD-10-CM | POA: Diagnosis not present

## 2017-07-29 DIAGNOSIS — Z5181 Encounter for therapeutic drug level monitoring: Secondary | ICD-10-CM

## 2017-07-29 DIAGNOSIS — M353 Polymyalgia rheumatica: Secondary | ICD-10-CM | POA: Insufficient documentation

## 2017-07-29 DIAGNOSIS — Z953 Presence of xenogenic heart valve: Secondary | ICD-10-CM

## 2017-07-29 LAB — POCT INR: INR: 2.4

## 2017-07-29 NOTE — Progress Notes (Signed)
   Subjective:    Patient ID: [name], female    DOB: [birth year], 83 y.o.   MRN: [id number]  HPI she presents today as a 83 year old patient with a chief complaint of a painful knot to the lateral aspect of her fifth metatarsal area of the right foot. States is then present for years but certain shoes of discomfort to the point where is extremely painful. She states this become worse in the past 2-3 months.  Review of Systems     Objective:   Physical Exam: Vital signs are stable alert and oriented 3. Pulses are palpable. Neurologic sensorium is intact. Fifth metatarsal base is very prominent with fluctuance beneath the edge of the base. Radiographs do not demonstrate any type of major osseous abnormalities or fractures.        Assessment & Plan:  Fifth metatarsal base metatarsalgia or capsulitis/bursitis.  Plan: I injected dexamethasone and local anesthetic and demonstrated to her and her niece how to place padding. I'll follow-up with her in 1 month if necessary.

## 2017-08-10 ENCOUNTER — Other Ambulatory Visit
Admission: RE | Admit: 2017-08-10 | Discharge: 2017-08-10 | Disposition: A | Discharge status: Home / Self Care | Payer: Medicare Other | Source: Ambulatory Visit | Attending: Cardiovascular Disease | Admitting: Cardiovascular Disease

## 2017-08-10 ENCOUNTER — Ambulatory Visit (INDEPENDENT_AMBULATORY_CARE_PROVIDER_SITE_OTHER): Payer: Medicare Other

## 2017-08-10 DIAGNOSIS — Z5181 Encounter for therapeutic drug level monitoring: Secondary | ICD-10-CM | POA: Diagnosis not present

## 2017-08-10 DIAGNOSIS — I4891 Unspecified atrial fibrillation: Secondary | ICD-10-CM

## 2017-08-10 DIAGNOSIS — Z953 Presence of xenogenic heart valve: Secondary | ICD-10-CM | POA: Diagnosis not present

## 2017-08-10 DIAGNOSIS — Z7901 Long term (current) use of anticoagulants: Secondary | ICD-10-CM | POA: Insufficient documentation

## 2017-08-10 LAB — PROTIME-INR
INR: 3.83
PROTHROMBIN TIME: 37.4 s — AB (ref 11.4–15.2)

## 2017-08-10 LAB — POCT INR: INR: 5.4

## 2017-08-26 ENCOUNTER — Ambulatory Visit (INDEPENDENT_AMBULATORY_CARE_PROVIDER_SITE_OTHER): Payer: Medicare Other

## 2017-08-26 DIAGNOSIS — I4891 Unspecified atrial fibrillation: Secondary | ICD-10-CM

## 2017-08-26 DIAGNOSIS — Z5181 Encounter for therapeutic drug level monitoring: Secondary | ICD-10-CM | POA: Diagnosis not present

## 2017-08-26 DIAGNOSIS — Z953 Presence of xenogenic heart valve: Secondary | ICD-10-CM

## 2017-08-26 LAB — POCT INR: INR: 1.7

## 2017-09-09 ENCOUNTER — Ambulatory Visit (INDEPENDENT_AMBULATORY_CARE_PROVIDER_SITE_OTHER): Payer: Medicare Other

## 2017-09-09 DIAGNOSIS — Z953 Presence of xenogenic heart valve: Secondary | ICD-10-CM

## 2017-09-09 DIAGNOSIS — I4891 Unspecified atrial fibrillation: Secondary | ICD-10-CM

## 2017-09-09 DIAGNOSIS — Z5181 Encounter for therapeutic drug level monitoring: Secondary | ICD-10-CM

## 2017-09-09 LAB — POCT INR: INR: 2

## 2017-09-23 ENCOUNTER — Ambulatory Visit (INDEPENDENT_AMBULATORY_CARE_PROVIDER_SITE_OTHER): Payer: Medicare Other

## 2017-09-23 DIAGNOSIS — Z5181 Encounter for therapeutic drug level monitoring: Secondary | ICD-10-CM | POA: Diagnosis not present

## 2017-09-23 DIAGNOSIS — Z953 Presence of xenogenic heart valve: Secondary | ICD-10-CM

## 2017-09-23 DIAGNOSIS — I4891 Unspecified atrial fibrillation: Secondary | ICD-10-CM | POA: Diagnosis not present

## 2017-09-23 LAB — POCT INR: INR: 2.4

## 2017-10-13 ENCOUNTER — Encounter: Payer: Medicare Other | Admitting: *Deleted

## 2017-10-13 ENCOUNTER — Ambulatory Visit (INDEPENDENT_AMBULATORY_CARE_PROVIDER_SITE_OTHER): Payer: Medicare Other | Admitting: Internal Medicine

## 2017-10-13 ENCOUNTER — Encounter: Payer: Self-pay | Admitting: Internal Medicine

## 2017-10-13 ENCOUNTER — Ambulatory Visit (INDEPENDENT_AMBULATORY_CARE_PROVIDER_SITE_OTHER): Payer: Medicare Other

## 2017-10-13 ENCOUNTER — Telehealth: Payer: Self-pay | Admitting: Cardiology

## 2017-10-13 VITALS — BP 120/80 | HR 77 | Ht 63.0 in | Wt 133.0 lb

## 2017-10-13 DIAGNOSIS — Z953 Presence of xenogenic heart valve: Secondary | ICD-10-CM

## 2017-10-13 DIAGNOSIS — I4891 Unspecified atrial fibrillation: Secondary | ICD-10-CM | POA: Diagnosis not present

## 2017-10-13 DIAGNOSIS — R001 Bradycardia, unspecified: Secondary | ICD-10-CM

## 2017-10-13 DIAGNOSIS — Z5181 Encounter for therapeutic drug level monitoring: Secondary | ICD-10-CM

## 2017-10-13 LAB — POCT INR: INR: 2.8

## 2017-10-13 NOTE — Telephone Encounter (Signed)
LMOVM reminding pt to send remote transmission.   

## 2017-10-13 NOTE — Patient Instructions (Signed)
Medication Instructions:  Your physician recommends that you continue on your current medications as directed. Please refer to the Current Medication list given to you today.   Labwork: none  Testing/Procedures: none  Follow-Up: Your physician wants you to follow-up in: 1 year with Dr. Caryl Comes. You will receive a reminder letter in the mail two months in advance. If you don't receive a letter, please call our office to schedule the follow-up appointment.   Any Other Special Instructions Will Be Listed Below (If Applicable).     If you need a refill on your cardiac medications before your next appointment, please call your pharmacy.

## 2017-10-13 NOTE — Progress Notes (Signed)
Patient Care Team: Jodi Marble, MD as PCP - General (Internal Medicine) Minna Merritts, MD (Cardiology) Franchot Gallo, MD as Attending Physician (Urology) Frederik Pear, MD (Orthopedic Surgery) Deboraha Sprang, MD as Consulting Physician (Cardiology)   HPI  [name] is a 83 y.o. female Seen in follow-up for pacemaker planted for permanent atrial fibrillation and bradycardia.  She has complete heart block.  She was having sinking feelings we thought might be related to PVCs.  We increased her pacing rate from 60--75 and activated sleep mode.  Right after that she was hospitalized for pneumonia.  She has had no more sinking spells.  Her breathing has been quite good until just today.  Reviewing Thanksgiving weekend it was decreased diuretics and increase salt intake.  No chest pain.    Thromboembolic risk factors ( age -37, HTN-1, TIA/CVA-2,Vascular disease -1  Gender-1) for a CHADSVASc Score 6  sshe has  permanent pacer implanted in the setting of permanent atrial fibrillation and bradycardia. She is also status post bioprosthetic aortic valve replacement. She has prior TIA with her INR target 2.5-3.0     Echocardiogram in February 2012 showed ejection fraction 50-55%, pulmonary pressures 46 mm of mercury; mild aortic stenosis with intact aortic valve, bioprosthetic valve, otherwise normal study.  5/16  Myoview normal LV function without evidence of ischemia 6/17  Echo EF normal with normal  functioning aortic bioprosthesis and aortic aneurysm     Past Medical History:  Diagnosis Date  . AAA (abdominal aortic aneurysm) ([place])    a. 12/2011 U/S: 3.3 cm prox AAA.  [name] Anxiety   . Aortic insufficiency    a. S/P bioprosthetic AVR;  b. 02/2013 Echo: EF 50-55%, mild to mod AI (mean grad 85mmHg).  . Brain aneurysm    Hx of  . Chest pain    a. H/o nonobs cath;  b. 07/2013 Lexiscan MV: EF 66%, no ischemia.  . Chronic diastolic CHF (congestive heart failure) ([place])    a.  02/2013 Echo: EF 50-55%.  . Diverticulosis   . Gastritis   . Hemorrhoids   . Hepatitis B 1980's  . Hiatal hernia   . Hypertension   . Moderate mitral regurgitation    a. 02/2013 Echo: Mod MR.  . Moderate tricuspid regurgitation    a. 02/2013 Echo: Mod TR.  [name] Permanent atrial fibrillation ([place])    a. Chronic coumadin.  . Polymyalgia rheumatica ([place])   . Pulmonary hypertension ([place])    a. 02/2013 Echo: PASP 39mmHg.  . S/P aortic valve replacement with bioprosthetic valve    a. [facility]  . S/P placement of cardiac pacemaker    a.  08/2007 s/p MDT [place] PPM.  . Seizures ([place])   . Shingles   . Skin cancer    face, ear  and left hand.  . Tachy-brady syndrome ([place])    a. Severe in the setting of permanent atrial fibrillation with tachybrady syndrome;  b. 08/2007 s/p MDT [place] PPM.  . Thoracic aneurysm    a. 10/2011 CTA: 5.0 x 3.8 cm.    Past Surgical History:  Procedure Laterality Date  . ABDOMINAL AORTIC ANEURYSM REPAIR     Resection  . AORTIC VALVE REPLACEMENT     With bioprosthetic valve  . APPENDECTOMY    . BREAST BIOPSY    . CEREBRAL ANEURYSM REPAIR    . CHOLECYSTECTOMY    . EP IMPLANTABLE DEVICE N/A 08/20/2016   Procedure: PPM Generator Changeout;  Surgeon:  Deboraha Sprang, MD;  Location: [facility];  Service: Cardiovascular;  Laterality: N/A;  . LUNG BIOPSY  2005  . PACEMAKER PLACEMENT  08/2007   Medtronic; AF permanent; s/p pacemaker for bradycardia with now well controlled ventricular response; Digoxin therapy, question level; Lower extremity venous issues  . skin cancer removal     ear, face and hand  . VESICOVAGINAL FISTULA CLOSURE W/ TAH      Current Outpatient Medications  Medication Sig Dispense Refill  . acetaminophen (TYLENOL) 500 MG tablet Take 1 tablet (500 mg total) by mouth every 6 (six) hours. 30 tablet 0  . albuterol (PROVENTIL HFA;VENTOLIN HFA) 108 (90 Base) MCG/ACT inhaler Inhale 1 puff into the lungs every 6 (six)  hours as needed for wheezing or shortness of breath.    . Brinzolamide-Brimonidine (SIMBRINZA) 1-0.2 % SUSP Place 1 drop into both eyes daily.     . busPIRone (BUSPAR) 7.5 MG tablet Take 7.5 mg by mouth 2 (two) times daily.    . diclofenac sodium (VOLTAREN) 1 % GEL Apply 4 g topically 2 (two) times daily.    . flintstones complete (FLINTSTONES) 60 MG chewable tablet Chew 1 tablet by mouth daily.    [name] latanoprost (XALATAN) 0.005 % ophthalmic solution Place 1 drop into both eyes at bedtime.    [name] LORazepam (ATIVAN) 0.5 MG tablet Take 1 tablet (0.5 mg total) by mouth every 8 (eight) hours as needed for anxiety. 12 tablet 0  . meclizine (ANTIVERT) 25 MG tablet Take 12.5 mg by mouth 3 (three) times daily as needed for dizziness.    . pantoprazole (PROTONIX) 40 MG tablet Take 40 mg by mouth daily.     . polyethylene glycol (MIRALAX / GLYCOLAX) packet Take 17 g by mouth daily as needed for moderate constipation. (Patient taking differently: Take 17 g by mouth daily. ) 14 each 0  . potassium chloride SA (K-DUR,KLOR-CON) 20 MEQ tablet TAKE ONE TABLET BY MOUTH EACH EVENING 90 tablet 3  . torsemide (DEMADEX) 20 MG tablet Take 1.5 tablets (30 mg total) by mouth daily. (Patient taking differently: Take 20 mg by mouth daily. )    . traMADol (ULTRAM) 50 MG tablet Take 1 tablet (50 mg total) by mouth every 6 (six) hours as needed for moderate pain. 30 tablet 0  . traZODone (DESYREL) 50 MG tablet Take 50 mg by mouth at bedtime.     [name] warfarin (COUMADIN) 5 MG tablet Take 2.5-5 mg by mouth See admin instructions. Take 1/2 tablet on Friday then take 1 tablet all other days    . warfarin (COUMADIN) 5 MG tablet TAKE AS DIRECTED BY THE COUMADIN CLINIC 30 tablet 3   No current facility-administered medications for this visit.     Allergies  Allergen Reactions  . Fluticasone-Salmeterol Anaphylaxis  . Furosemide Shortness Of Breath  . Iodine Other (See Comments)    Reaction:  Fever and chills   . Iohexol Other (See  Comments)    Desc:  Pt requires a 13 hr prep, when doing exam with contrast....fever, chills, dyspnea (slg/04/27/09--given 2 hour solumedrol/ benadryl prep--no reaction).    . Sulfonamide Derivatives Nausea And Vomiting  . Verapamil Nausea And Vomiting and Other (See Comments)    Reaction:  Loss of appetite   . Macrobid [Nitrofurantoin Monohyd Macro] Nausea Only and Other (See Comments)    Severe stomach pain  . Latex Rash  . Penicillins Rash and Other (See Comments)    Pt unable to answer additional questions  about this medication.   Has patient had a PCN reaction causing immediate rash, facial/tongue/throat swelling, SOB or lightheadedness with hypotension: Yes Has patient had a PCN reaction causing severe rash involving mucus membranes or skin necrosis: UnknownHas patient had a PCN reaction that required hospitalization NoHas patient had a PCN reaction occurring within the last 10 years: No If all of the above answers are "NO", then may proceed with Cephalosporin    Review of Systems negative except from HPI and PMH  Physical Exam BP 120/80 (BP Location: Left Wrist, Patient Position: Sitting, Cuff Size: Normal)   Pulse 77   Ht 5\' 3"  (1.6 m)   Wt 133 lb (60.3 kg)   BMI 23.56 kg/m  Well developed and nourished in no acute distress HENT normal Neck supple with JVP-flat Device pocket well healed; without hematoma or erythema.  There is no tethering  Clear Regular rate and rhythm, 2/6 m Abd-soft with active BS without hepatomegaly No Clubbing cyanosis tr edema Skin-warm and dry A & Oriented  Grossly normal sensory and motor function   ECG demonstrates atrial fibrillation with ventricular pacing at 77  Assessment and  Plan  HFpEF  Complete heart block-device dependent  Atrial fibrillation-permanent The patient's device was interrogated.  The information was reviewed. No changes were made in the programming.    Pacemaker-Medtronic   The patient's device was interrogated.  The  information was reviewed. No changes were made in the programming.     Hypertension  Dyspnea  Spells   She is mildly volume overloaded.  I will have her take 1 extra dose of diuretic.  We also have reviewed her copious fluid intake somewhere [place] of 3 L a day.  They will work on backing it down.  On Anticoagulation;  No bleeding issues   BP well controlled  Spells  much improved with the obliteration of her PVCs.

## 2017-10-13 NOTE — Patient Instructions (Signed)
Please continue current dosage of 1 pill daily except 1/2 pill on Tuesdays, Thursdays and Saturdays. Recheck in 4 weeks .

## 2017-10-15 ENCOUNTER — Encounter: Payer: Self-pay | Admitting: Cardiology

## 2017-10-20 ENCOUNTER — Other Ambulatory Visit: Payer: Self-pay | Admitting: Internal Medicine

## 2017-11-03 LAB — CUP PACEART INCLINIC DEVICE CHECK
Implantable Lead Location: 753860
Implantable Pulse Generator Implant Date: 20171004
Lead Channel Pacing Threshold Amplitude: 1.5 V
Lead Channel Pacing Threshold Pulse Width: 0.4 ms
MDC IDC LEAD IMPLANT DT: 20081003
MDC IDC SESS DTM: 20181218112710

## 2017-11-16 ENCOUNTER — Ambulatory Visit (INDEPENDENT_AMBULATORY_CARE_PROVIDER_SITE_OTHER): Payer: Medicare Other

## 2017-11-16 DIAGNOSIS — Z5181 Encounter for therapeutic drug level monitoring: Secondary | ICD-10-CM | POA: Diagnosis not present

## 2017-11-16 DIAGNOSIS — I4891 Unspecified atrial fibrillation: Secondary | ICD-10-CM

## 2017-11-16 DIAGNOSIS — Z953 Presence of xenogenic heart valve: Secondary | ICD-10-CM | POA: Diagnosis not present

## 2017-11-16 LAB — POCT INR: INR: 4

## 2017-11-16 NOTE — Patient Instructions (Signed)
Please skip coumadin tonight, then resume dosage of 1 pill daily except 1/2 pill on Tuesdays, Thursdays and Saturdays. Recheck in 2 weeks.

## 2017-11-17 ENCOUNTER — Other Ambulatory Visit: Payer: Self-pay | Admitting: Cardiovascular Disease

## 2017-11-20 ENCOUNTER — Telehealth: Payer: Self-pay | Admitting: Cardiovascular Disease

## 2017-11-20 NOTE — Telephone Encounter (Signed)
Spoke with pt's niece Maudie Mercury who states Mrs [name] was started on Erythromycin 500 mg tabs 2 daily for 7 days . Spoke with [place] PharmD who states that may have some interaction between coumadin and Erythromycin so rescheduled her appt for Wednesday in [place] as the niece is not able to bring her to appt on Monday Instructed to take coumadin as ordered and Erythromycin as ordered and to eat couple extra servings of greens between now and Wednesday and Maudie Mercury states understanding Maudie Mercury states she is on the antibiotic because of fluid around lungs

## 2017-11-20 NOTE — Telephone Encounter (Signed)
Patient has just started taking an antibiotic that was prescribed Patient niece Maudie Mercury is calling because she feels it is conflicting with her coumadin medication Please call to discuss

## 2017-11-25 ENCOUNTER — Ambulatory Visit (INDEPENDENT_AMBULATORY_CARE_PROVIDER_SITE_OTHER): Payer: Medicare Other

## 2017-11-25 DIAGNOSIS — I4891 Unspecified atrial fibrillation: Secondary | ICD-10-CM

## 2017-11-25 DIAGNOSIS — Z5181 Encounter for therapeutic drug level monitoring: Secondary | ICD-10-CM

## 2017-11-25 DIAGNOSIS — Z953 Presence of xenogenic heart valve: Secondary | ICD-10-CM

## 2017-11-25 LAB — POCT INR: INR: 2.7

## 2017-11-25 NOTE — Patient Instructions (Signed)
Please continue dosage of 1 pill daily except 1/2 pill on Tuesdays, Thursdays and Saturdays. Recheck in 1 week.

## 2017-11-29 NOTE — Progress Notes (Signed)
Cardiology Office Note  Date:  12/02/2017   ID:  [name], DOB 07/29/1922, MRN [id number]  PCP:  Jodi Marble, MD   Chief Complaint  Patient presents with  . OTHER    6 month f/u c/o edema and sob. Pt mentioned that she took extra torsemide yesterday due to weight gain. Meds reviewed verbally with pt.    HPI:  84 yo WF with history of  tachy-brady syndrome s/p pacemaker,  Permanent atrial fibrillation on warfarin,  HTN,  bioprosthetic AVR seen on echo June 2017,  AAA with repair 4-5 years ago,  chronic diastolic heart failure,   right brachial arterial stenosis,  TIA in 2012,   No significant coronary artery disease or carotid arterial disease Echocardiogram in 2012 showing moderate MR and TR with moderate pulmonary hypertension  significant peripheral vascular disease, Thoracic aortic aneurysm followed by Dr. Donnetta Hutching.  Treated with chronic prednisone for polymyalgia Chronic nausea presenting for routine followup of her chronic diastolic CHF, aneurysm and atrial fibrillation  No recent hospitalizations Recent medical issues managed by primary care Reports having chest x-ray with small pleural effusion Took extra torsemide Was having mild shortness of breath Takes torsemide 20 mg daily, occasionally takes 1.5 pills for extra leg swelling or shortness of breath  Difficulty with urinary tract infections, completed course of antibiotics, repeat culture done through primary care, results not available  Denies having any tachycardia or palpitations   Previous CT scan and records reviewed from Dr. Donnetta Hutching Notes indicating to not pursue aggressive course with her aortic aneurysms Nonsurgical candidate  Previously in the hospital June 2018 for hemoptysis, shortness of breath CT scan showing multifocal pneumonia, possible aspiration, started on antibiotics, BIPAP Possible necrosis  Tolerating warfarin  EKG personally reviewed by myself on todays visit Shows atrial  fibrillation rate 76 bpm, ventricularly paced rhythm  S/P AAA repair/distal aortic arch aneurysm Continue blood pressure control. 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 The mid descending thoracic aorta measures 4.9 cm and is stable. Distal descending thoracic aorta measures 4.3 cm and is stable.   Other past medical history reviewed CT 05/2016 Thoracic aortic aneurysm, measuring 5.3 cm at the level of the proximal descending thoracic aorta, previously 4.5 cm  Echocardiogram April 2016 with significant pulmonary hypertension.  pneumonia in October 2015.   chronic knee pain Some shortness of breath but is not very active at baseline  History of severe COPD exacerbation, bronchitis. She was on Z-Pak, prednisone, started on albuterol inhaler She took several days of diuretics, torsemide,  with mild weight loss now down to 140 pounds. No significant edema. Symptoms improved  Previous Evaluation of her pacemaker report shows she is 60% RV paced  ad[facility] on [facility] 2011 with shortness of breath, acute on chronic diastolic heart failure, underlying hypertension.  possible  pneumonia. White blood cell count was 13. Antibiotics were discontinued after her chest x-ray cleared.  Numerous skin cancers over the past several years, on her legs, face, arms requiring resection History of arthritis in her knees requiring cortisone shot  cardiac catheterization over 5 years ago showing no significant coronary artery disease, recent CT scan showing stable descending aorta and after repair, minimal dilatation of the descending aorta, stable-appearing aortic valve.   PMH:   has a past medical history of AAA (abdominal aortic aneurysm) ([place]), Anxiety, Aortic insufficiency, Brain aneurysm, Chest pain, Chronic diastolic CHF (congestive heart failure) ([place]), Diverticulosis, Gastritis, Hemorrhoids, Hepatitis B (1980's), Hiatal hernia, Hypertension, Moderate mitral  regurgitation,  Moderate tricuspid regurgitation, Permanent atrial fibrillation (H[facility]), Polymyalgia rheumatica (H[facility]), Pulmonary hypertension ([facility]), S/P aortic valve replacement with bioprosthetic valve, S/P placement of cardiac pacemaker, Seizures ([place]), Shingles, Skin cancer, Tachy-brady syndrome ([place]), and Thoracic aneurysm.  PSH:    Past Surgical History:  Procedure Laterality Date  . ABDOMINAL AORTIC ANEURYSM REPAIR     Resection  . AORTIC VALVE REPLACEMENT     With bioprosthetic valve  . APPENDECTOMY    . BREAST BIOPSY    . CEREBRAL ANEURYSM REPAIR    . CHOLECYSTECTOMY    . EP IMPLANTABLE DEVICE N/A 08/20/2016   Procedure: PPM Generator Changeout;  Surgeon: Deboraha Sprang, MD;  Location: [facility];  Service: Cardiovascular;  Laterality: N/A;  . LUNG BIOPSY  2005  . PACEMAKER PLACEMENT  08/2007   Medtronic; AF permanent; s/p pacemaker for bradycardia with now well controlled ventricular response; Digoxin therapy, question level; Lower extremity venous issues  . skin cancer removal     ear, face and hand  . VESICOVAGINAL FISTULA CLOSURE W/ TAH      Current Outpatient Medications  Medication Sig Dispense Refill  . acetaminophen (TYLENOL) 500 MG tablet Take 1 tablet (500 mg total) by mouth every 6 (six) hours. 30 tablet 0  . albuterol (PROVENTIL HFA;VENTOLIN HFA) 108 (90 Base) MCG/ACT inhaler Inhale 1 puff into the lungs every 6 (six) hours as needed for wheezing or shortness of breath.    . Brinzolamide-Brimonidine (SIMBRINZA) 1-0.2 % SUSP Place 1 drop into both eyes daily.     . busPIRone (BUSPAR) 7.5 MG tablet Take 7.5 mg by mouth 2 (two) times daily.    . diclofenac sodium (VOLTAREN) 1 % GEL Apply 4 g topically 2 (two) times daily.    . flintstones complete (FLINTSTONES) 60 MG chewable tablet Chew 1 tablet by mouth daily.    [name] latanoprost (XALATAN) 0.005 % ophthalmic solution Place 1 drop into both eyes at bedtime.    [name] LORazepam (ATIVAN) 0.5 MG tablet Take 1 tablet (0.5 mg total) by  mouth every 8 (eight) hours as needed for anxiety. 12 tablet 0  . meclizine (ANTIVERT) 25 MG tablet Take 12.5 mg by mouth 3 (three) times daily as needed for dizziness.    . pantoprazole (PROTONIX) 40 MG tablet Take 40 mg by mouth daily.     . polyethylene glycol (MIRALAX / GLYCOLAX) packet Take 17 g by mouth daily as needed for moderate constipation. (Patient taking differently: Take 17 g by mouth daily. ) 14 each 0  . potassium chloride SA (K-DUR,KLOR-CON) 20 MEQ tablet TAKE ONE TABLET BY MOUTH EACH EVENING 90 tablet 3  . torsemide (DEMADEX) 20 MG tablet Take 1.5 tablets (30 mg total) by mouth daily. (Patient taking differently: Take 20 mg by mouth daily. )    . traMADol (ULTRAM) 50 MG tablet Take 1 tablet (50 mg total) by mouth every 6 (six) hours as needed for moderate pain. 30 tablet 0  . traZODone (DESYREL) 50 MG tablet Take 50 mg by mouth at bedtime.     [name] warfarin (COUMADIN) 5 MG tablet Take 2.5-5 mg by mouth See admin instructions. Take 1/2 tablet on Friday then take 1 tablet all other days    . warfarin (COUMADIN) 5 MG tablet TAKE AS DIRECTED BY THE COUMADIN CLINIC 30 tablet 3   No current facility-administered medications for this visit.      Allergies:   Fluticasone-salmeterol; Furosemide; Iodine; Iohexol; Sulfonamide derivatives; Verapamil; Macrobid [nitrofurantoin monohyd macro]; Latex; and  Penicillins   Social History:  The patient  reports that  has never smoked. she has never used smokeless tobacco. She reports that she does not drink alcohol or use drugs.   Family History:   family history includes AAA (abdominal aortic aneurysm) in her sister; Cancer in her mother; Heart disease in her brother, father, mother, and sister; Hypertension in her brother, father, mother, and sister; Varicose Veins in her mother and sister.    Review of Systems: Review of Systems  Respiratory: Negative.   Cardiovascular: Negative.   Gastrointestinal: Negative.   Musculoskeletal: Negative.    Neurological: Positive for weakness.  Psychiatric/Behavioral: Negative.   All other systems reviewed and are negative.    PHYSICAL EXAM: VS:  BP 120/64 (BP Location: Left Arm, Patient Position: Sitting, Cuff Size: Normal)   Pulse 76   Ht 5\' 2"  (1.575 m)   Wt 136 lb 4 oz (61.8 kg)   BMI 24.92 kg/m  , BMI Body mass index is 24.92 kg/m. GEN: Well nourished, well developed, in no acute distress, presenting a wheelchair  HEENT: normal  Neck: no JVD, carotid bruits, or masses Cardiac: RRR; no murmurs, rubs, or gallops,no edema  Respiratory:  clear to auscultation bilaterally, normal work of breathing GI: soft, nontender, nondistended, + BS MS: no deformity or atrophy  Skin: warm and dry, no rash Neuro:  Strength and sensation are intact Psych: euthymic mood, full affect    Recent Labs: 04/15/2017: B Natriuretic Peptide 190.9 04/21/2017: BUN 19; Creatinine, Ser 0.89; Potassium 4.1; Sodium 138 04/22/2017: Hemoglobin 9.0; Platelets 133      Wt Readings from Last 3 Encounters:  12/02/17 136 lb 4 oz (61.8 kg)  10/13/17 133 lb (60.3 kg)  06/03/17 140 lb 6 oz (63.7 kg)       ASSESSMENT AND PLAN:  Atrial fibrillation, unspecified type ([place]) - Plan: EKG 12-Lead Ventricular rate well controlled, tolerating anticoagulation No recent falls  Thoracic aortic aneurysm without rupture [facility]) Not a surgical candidate No further imaging at this time  Leg swelling Minimal swelling, recommended compression hose for swelling, leg elevation  DIASTOLIC HEART FAILURE, CHRONIC continue torsemide 20 up to 30 mg daily Appears relatively euvolemic on today's visit Previous chest x-ray with small pleural effusion, patient reports repeat chest x-ray with improvement  S/P AAA repair  seen on recent CT scan Not a candidate for aortic aneurysm surgery  Nausea Previously chronic issue Managed by primary care  AVR, bioprosthetic echocardiogram June 2017 Functioning well,  well-seated  Weakness  Continues to be weak, stable, presenting in a wheelchair   Total encounter time more than 25 minutes  Greater than 50% was spent in counseling and coordination of care with the patient    Disposition:   F/U  6 months   Orders Placed This Encounter  Procedures  . EKG 12-Lead     Signed, Esmond Plants, M.D., Ph.D. 12/02/2017  [place] [phone]\

## 2017-12-02 ENCOUNTER — Encounter: Payer: Self-pay | Admitting: Cardiovascular Disease

## 2017-12-02 ENCOUNTER — Ambulatory Visit (INDEPENDENT_AMBULATORY_CARE_PROVIDER_SITE_OTHER): Payer: Medicare Other | Admitting: Cardiovascular Disease

## 2017-12-02 ENCOUNTER — Ambulatory Visit (INDEPENDENT_AMBULATORY_CARE_PROVIDER_SITE_OTHER): Payer: Medicare Other

## 2017-12-02 VITALS — BP 120/64 | HR 76 | Ht 62.0 in | Wt 136.2 lb

## 2017-12-02 DIAGNOSIS — I481 Persistent atrial fibrillation: Secondary | ICD-10-CM

## 2017-12-02 DIAGNOSIS — Z953 Presence of xenogenic heart valve: Secondary | ICD-10-CM

## 2017-12-02 DIAGNOSIS — Z8679 Personal history of other diseases of the circulatory system: Secondary | ICD-10-CM | POA: Diagnosis not present

## 2017-12-02 DIAGNOSIS — I442 Atrioventricular block, complete: Secondary | ICD-10-CM | POA: Diagnosis not present

## 2017-12-02 DIAGNOSIS — I495 Sick sinus syndrome: Secondary | ICD-10-CM

## 2017-12-02 DIAGNOSIS — I5032 Chronic diastolic (congestive) heart failure: Secondary | ICD-10-CM | POA: Diagnosis not present

## 2017-12-02 DIAGNOSIS — Z9889 Other specified postprocedural states: Secondary | ICD-10-CM | POA: Diagnosis not present

## 2017-12-02 DIAGNOSIS — E782 Mixed hyperlipidemia: Secondary | ICD-10-CM | POA: Diagnosis not present

## 2017-12-02 DIAGNOSIS — I272 Pulmonary hypertension, unspecified: Secondary | ICD-10-CM

## 2017-12-02 DIAGNOSIS — I4819 Other persistent atrial fibrillation: Secondary | ICD-10-CM

## 2017-12-02 DIAGNOSIS — I4891 Unspecified atrial fibrillation: Secondary | ICD-10-CM

## 2017-12-02 DIAGNOSIS — I10 Essential (primary) hypertension: Secondary | ICD-10-CM

## 2017-12-02 DIAGNOSIS — Z5181 Encounter for therapeutic drug level monitoring: Secondary | ICD-10-CM | POA: Diagnosis not present

## 2017-12-02 DIAGNOSIS — M353 Polymyalgia rheumatica: Secondary | ICD-10-CM | POA: Diagnosis not present

## 2017-12-02 LAB — POCT INR: INR: 4.3

## 2017-12-02 NOTE — Patient Instructions (Signed)

## 2017-12-02 NOTE — Patient Instructions (Signed)
Please hold coumadin tonight and tomorrow, then resume dosage of 1 pill daily except 1/2 pill on Tuesdays, Thursdays and Saturdays. PLEASE BE CONSISTENT WITH YOUR GREEN INTAKE. Recheck in 2 weeks.

## 2017-12-10 ENCOUNTER — Telehealth: Payer: Self-pay | Admitting: Cardiovascular Disease

## 2017-12-10 NOTE — Telephone Encounter (Signed)
Pt c/o Shortness Of Breath: STAT if SOB developed within the last 24 hours or pt is noticeably SOB on the phone  1. Are you currently SOB (can you hear that pt is SOB on the phone)? No, but has gotten better, she took her inhaler  2. How long have you been experiencing SOB? About 3 weeks  3. Are you SOB when sitting or when up moving around? sitting  4. Are you currently experiencing any other symptoms? Dizziness

## 2017-12-10 NOTE — Telephone Encounter (Signed)
S/w patient's daughter, ok per DPR. Patient has been getting shortness of breath at times throughout the day with exertion as well as waking her up at night. Denies chest pain or swelling. She is dizzy when up and moving around. PCP placed patient on antibiotic a few weeks ago due to what was seen on CXR.  Per daughter, patient recently had repeat CXR. (I could not find results in system.) Patient used one of her inhalers and the shortness of breath gets better for a time. PCP, Dr Conley Rolls has been the one following the CXR.  Next appointment is 2/8 with PCP. Advised daughter to call PCP as they have the most recent CXR and ask for advice and see if she can be seen sooner. Advised if symptoms worsen to go to the ED and or call us back if needed. She verbalized understanding.

## 2017-12-16 ENCOUNTER — Ambulatory Visit (INDEPENDENT_AMBULATORY_CARE_PROVIDER_SITE_OTHER): Payer: Medicare Other

## 2017-12-16 ENCOUNTER — Other Ambulatory Visit
Admission: RE | Admit: 2017-12-16 | Discharge: 2017-12-16 | Disposition: A | Discharge status: Home / Self Care | Payer: Medicare Other | Source: Ambulatory Visit | Attending: Cardiovascular Disease | Admitting: Cardiovascular Disease

## 2017-12-16 ENCOUNTER — Encounter: Payer: Self-pay | Admitting: Cardiovascular Disease

## 2017-12-16 ENCOUNTER — Telehealth: Payer: Self-pay | Admitting: Cardiovascular Disease

## 2017-12-16 DIAGNOSIS — I4891 Unspecified atrial fibrillation: Secondary | ICD-10-CM

## 2017-12-16 DIAGNOSIS — I5032 Chronic diastolic (congestive) heart failure: Secondary | ICD-10-CM

## 2017-12-16 DIAGNOSIS — Z5181 Encounter for therapeutic drug level monitoring: Secondary | ICD-10-CM

## 2017-12-16 DIAGNOSIS — Z953 Presence of xenogenic heart valve: Secondary | ICD-10-CM | POA: Diagnosis not present

## 2017-12-16 LAB — BASIC METABOLIC PANEL
ANION GAP: 11 (ref 5–15)
BUN: 30 mg/dL — AB (ref 6–20)
CO2: 32 mmol/L (ref 22–32)
Calcium: 8.7 mg/dL — ABNORMAL LOW (ref 8.9–10.3)
Chloride: 100 mmol/L — ABNORMAL LOW (ref 101–111)
Creatinine, Ser: 0.99 mg/dL (ref 0.44–1.00)
GFR calc Af Amer: 55 mL/min — ABNORMAL LOW (ref >60)
GFR, EST NON AFRICAN AMERICAN: 48 mL/min — AB (ref >60)
GLUCOSE: 131 mg/dL — AB (ref 65–99)
POTASSIUM: 4 mmol/L (ref 3.5–5.1)
Sodium: 143 mmol/L (ref 135–145)

## 2017-12-16 LAB — BRAIN NATRIURETIC PEPTIDE: B NATRIURETIC PEPTIDE 5: 121 pg/mL — AB (ref 0.0–100.0)

## 2017-12-16 LAB — POCT INR: INR: 4.7

## 2017-12-16 NOTE — Telephone Encounter (Signed)
Left voicemail message for her to call back.  

## 2017-12-16 NOTE — Addendum Note (Signed)
Addended by: Valora Corporal on: 12/16/2017 03:35 PM   Modules accepted: Orders

## 2017-12-16 NOTE — Telephone Encounter (Signed)
Patient here in office and Dr. Rockey Situ reviewed xray reports and requested labs to be done. BMP and BNP ordered for patient to have done over at [facility]. Orders placed and patient with niece aware.

## 2017-12-16 NOTE — Telephone Encounter (Signed)
Pt daughter calling stating pcp sent over a chest xray she did  She is calling to see if we have it and have looked over it  This was for patient, she is still having SOB at night  Would like advise on this Please call back

## 2017-12-16 NOTE — Telephone Encounter (Signed)
Spoke with patients niece per release form and she wanted to see if we received the chest xray from PCP office. She states that PCP wanted Dr. Rockey Situ to review. Advised that I would make sure that he reviews report and that I would give her a call back with any recommendations. She verbalized understanding with no further questions at this time.

## 2017-12-16 NOTE — Patient Instructions (Signed)
Please hold coumadin tonight and tomorrow, then start new dosage of 1/2 pill daily except 1 pill on Tuesdays, Thursdays and Saturdays. PLEASE BE CONSISTENT WITH YOUR GREENS INTAKE. Recheck in 2 weeks.

## 2017-12-17 NOTE — Telephone Encounter (Signed)
Reviewed Dr. Donivan Scull recommendations and placed referral for pulmonary evaluation for patient. Advised that I would have someone from scheduling give her a call to arrange appointment. She reports that patient has gained 3 pounds in the last 3 days and she is concerned. Reviewed lab results and recommendations. She verbalized understanding of our conversation and had no further questions at this time.

## 2017-12-17 NOTE — Telephone Encounter (Signed)
Left voicemail message to call back  

## 2017-12-17 NOTE — Progress Notes (Signed)
[name]      Assessment and Plan:  Nocturnal dyspnea. -Etiology is uncertain, could be multifactorial from fluid shift, PND, pleural effusion, GERD, postnasal drip. - I have asked the patient's niece to get her a wedge to sleep on which may help reduce the fluid shift.  -We will start nasal steroid 2 sprays in each nostril at night. -Continue taking PPI.  Avoid eating 4 hours before bedtime. - Use albuterol inhaler at bedtime, her technique was watched today and she would benefit from a spacer.   If no improvement in 4 weeks, will check CXR and and consider getting a wedge or sleeping in a recliner. Will also need to consider increasing lasix at that point.   Date: 12/18/2017  MRN# [id number] [name] February 29, 1922   [name] is a 84 y.o. old female seen in consultation for chief complaint of:    Chief Complaint  Patient presents with  . Advice Only    referred by Dr. Gollan:Bilateral Pleural effusion chest xray 12/15/17.  [name] Shortness of Breath    Pt wakes from sleep with sob x 3-4 wks. Pt then uses rescue inhaler Albuterol.    HPI:   The patient is a 84 year old female with a history of diastolic dysfunction, atrial fibrillation status post pacemaker. She is present here with her Niece who gives some of the history. She has been diagnosed with CHF, and has had a few admission. They notes that about a month ago she had an episode of flu like illness, her lungs were congested, she had a CXR which showed effusion. She was given abx, and her symptoms mostly improved.  She continues to have episodic dyspnea, it often wakes her up at night, and occurs during the day with exertion. On most nights she wakes up and has to use her rescue inhaler. She is also noted to have anxiety, and when she gets winded they check her oxygen it is normal but she will still feels that she can not get her breath.  The episodes at night usually occur about 2 to 3  am. She also wakes 2 to 3 times per night spontaneously.  Her rescue inhaler is albuterol, and she takes one puff at night. She is using symbicort 80, 1 puff bid. She does not use a spacer.  She does have reflux, she takes protonix but she still burps a lot.  She sleeps in her bed, she uses 2 pillows at night, has never tried a wedge.  She has stuffy nose and nasal drainage, she takes her nasal spray in the morning and not very regularly.  She does not snore at night, she has never been diagnosed with OSA.   CXR report: 11/19/17; Lungs hyperinflated. Small bilateral pleural effusions with bibasilar atelectasis. No pneumothorax.   Imaging personally reviewed, 04/15/17, right base small pleural effusion, atelectasis, including an area which may represent rounded atelectasis   PMHX:   Past Medical History:  Diagnosis Date  . AAA (abdominal aortic aneurysm) ([place])    a. 12/2011 U/S: 3.3 cm prox AAA.  [name] Anxiety   . Aortic insufficiency    a. S/P bioprosthetic AVR;  b. 02/2013 Echo: EF 50-55%, mild to mod AI (mean grad 45mmHg).  . Brain aneurysm    Hx of  . Chest pain    a. H/o nonobs cath;  b. 07/2013 Lexiscan MV: EF 66%, no ischemia.  . Chronic diastolic CHF (congestive heart failure) ([place])    a.  02/2013 Echo: EF 50-55%.  . Diverticulosis   . Gastritis   . Hemorrhoids   . Hepatitis B 1980's  . Hiatal hernia   . Hypertension   . Moderate mitral regurgitation    a. 02/2013 Echo: Mod MR.  . Moderate tricuspid regurgitation    a. 02/2013 Echo: Mod TR.  [name] Permanent atrial fibrillation ([place])    a. Chronic coumadin.  . Polymyalgia rheumatica ([place])   . Pulmonary hypertension ([place])    a. 02/2013 Echo: PASP 21mmHg.  . S/P aortic valve replacement with bioprosthetic valve    a. [facility]  . S/P placement of cardiac pacemaker    a.  08/2007 s/p MDT [place] PPM.  . Seizures ([place])   . Shingles   . Skin cancer    face, ear  and left hand.  . Tachy-brady syndrome ([place])    a.  Severe in the setting of permanent atrial fibrillation with tachybrady syndrome;  b. 08/2007 s/p MDT [place] PPM.  . Thoracic aneurysm    a. 10/2011 CTA: 5.0 x 3.8 cm.   Surgical Hx:  Past Surgical History:  Procedure Laterality Date  . ABDOMINAL AORTIC ANEURYSM REPAIR     Resection  . AORTIC VALVE REPLACEMENT     With bioprosthetic valve  . APPENDECTOMY    . BREAST BIOPSY    . CEREBRAL ANEURYSM REPAIR    . CHOLECYSTECTOMY    . EP IMPLANTABLE DEVICE N/A 08/20/2016   Procedure: PPM Generator Changeout;  Surgeon: Deboraha Sprang, MD;  Location: [facility];  Service: Cardiovascular;  Laterality: N/A;  . LUNG BIOPSY  2005  . PACEMAKER PLACEMENT  08/2007   [employer]; AF permanent; s/p pacemaker for bradycardia with now well controlled ventricular response; Digoxin therapy, question level; Lower extremity venous issues  . skin cancer removal     ear, face and hand  . VESICOVAGINAL FISTULA CLOSURE W/ TAH     Family Hx:  Family History  Problem Relation Age of Onset  . Cancer Mother   . Heart disease Mother   . Hypertension Mother   . Varicose Veins Mother   . Heart disease Father   . Hypertension Father   . Heart disease Sister   . Hypertension Sister   . Varicose Veins Sister   . AAA (abdominal aortic aneurysm) Sister   . Heart disease Brother   . Hypertension Brother    Social Hx:   Social History   Tobacco Use  . Smoking status: Never Smoker  . Smokeless tobacco: Never Used  Substance Use Topics  . Alcohol use: No  . Drug use: No   Medication:    Current Outpatient Medications:  .  acetaminophen (TYLENOL) 500 MG tablet, Take 1 tablet (500 mg total) by mouth every 6 (six) hours., Disp: 30 tablet, Rfl: 0 .  albuterol (PROVENTIL HFA;VENTOLIN HFA) 108 (90 Base) MCG/ACT inhaler, Inhale 1 puff into the lungs every 6 (six) hours as needed for wheezing or shortness of breath., Disp: , Rfl:  .  Brinzolamide-Brimonidine (SIMBRINZA) 1-0.2 % SUSP, Place 1 drop  into both eyes daily. , Disp: , Rfl:  .  budesonide-formoterol (SYMBICORT) 160-4.5 MCG/ACT inhaler, Inhale 1 puff into the lungs 2 (two) times daily., Disp: , Rfl:  .  busPIRone (BUSPAR) 7.5 MG tablet, Take 7.5 mg by mouth 2 (two) times daily., Disp: , Rfl:  .  diclofenac sodium (VOLTAREN) 1 % GEL, Apply 4 g topically 2 (two) times daily., Disp: , Rfl:  .  flintstones complete (FLINTSTONES) 60 MG chewable tablet, Chew 1 tablet by mouth daily., Disp: , Rfl:  .  latanoprost (XALATAN) 0.005 % ophthalmic solution, Place 1 drop into both eyes at bedtime., Disp: , Rfl:  .  LORazepam (ATIVAN) 0.5 MG tablet, Take 1 tablet (0.5 mg total) by mouth every 8 (eight) hours as needed for anxiety., Disp: 12 tablet, Rfl: 0 .  meclizine (ANTIVERT) 25 MG tablet, Take 12.5 mg by mouth 3 (three) times daily as needed for dizziness., Disp: , Rfl:  .  pantoprazole (PROTONIX) 40 MG tablet, Take 40 mg by mouth daily. , Disp: , Rfl:  .  polyethylene glycol (MIRALAX / GLYCOLAX) packet, Take 17 g by mouth daily as needed for moderate constipation. (Patient taking differently: Take 17 g by mouth daily. ), Disp: 14 each, Rfl: 0 .  potassium chloride SA (K-DUR,KLOR-CON) 20 MEQ tablet, TAKE ONE TABLET BY MOUTH EACH EVENING, Disp: 90 tablet, Rfl: 3 .  torsemide (DEMADEX) 20 MG tablet, Take 1.5 tablets (30 mg total) by mouth daily. (Patient taking differently: Take 20 mg by mouth daily. ), Disp: , Rfl:  .  traMADol (ULTRAM) 50 MG tablet, Take 1 tablet (50 mg total) by mouth every 6 (six) hours as needed for moderate pain., Disp: 30 tablet, Rfl: 0 .  traZODone (DESYREL) 50 MG tablet, Take 50 mg by mouth at bedtime. , Disp: , Rfl:  .  warfarin (COUMADIN) 5 MG tablet, Take 2.5-5 mg by mouth See admin instructions. Take 1/2 tablet on Friday then take 1 tablet all other days, Disp: , Rfl:  .  warfarin (COUMADIN) 5 MG tablet, TAKE AS DIRECTED BY THE COUMADIN CLINIC, Disp: 30 tablet, Rfl: 3   Allergies:  Fluticasone-salmeterol;  Furosemide; Iodine; Iohexol; Sulfonamide derivatives; Verapamil; Macrobid [nitrofurantoin monohyd macro]; Salmeterol; Latex; and Penicillins  Review of Systems: Gen:  Denies  fever, sweats, chills HEENT: Denies blurred vision, double vision. bleeds, sore throat Cvc:  No dizziness, chest pain. Resp:   Denies cough or sputum production, shortness of breath Gi: Denies swallowing difficulty, stomach pain. Gu:  Denies bladder incontinence, burning urine Ext:   No Joint pain, stiffness. Skin: No skin rash,  hives  Endoc:  No polyuria, polydipsia. Psych: No depression, insomnia. Other:  All other systems were reviewed with the patient and were negative other that what is mentioned in the HPI.   Physical Examination:   VS: BP 124/80 (BP Location: Left Arm, Cuff Size: Normal)   Pulse 80   Resp 16   Ht 5\' 2"  (1.575 m)   Wt 139 lb (63 kg)   SpO2 95%   BMI 25.42 kg/m   General Appearance: No distress  Neuro:without focal findings,  speech normal,  HEENT: PERRLA, EOM intact.   Pulmonary: normal breath sounds, No wheezing.  CardiovascularNormal S1,S2.  No m/r/g.   Abdomen: Benign, Soft, non-tender. Renal:  No costovertebral tenderness  GU:  No performed at this time. Endoc: No evident thyromegaly, no signs of acromegaly. Skin:   warm, no rashes, no ecchymosis  Extremities: normal, no cyanosis, clubbing.  Other findings:    LABORATORY PANEL:   CBC No results for input(s): WBC, HGB, HCT, PLT in the last 168 hours. ------------------------------------------------------------------------------------------------------------------  Chemistries  Recent Labs  Lab 12/16/17 1544  NA 143  K 4.0  CL 100*  CO2 32  GLUCOSE 131*  BUN 30*  CREATININE 0.99  CALCIUM 8.7*   ------------------------------------------------------------------------------------------------------------------  Cardiac Enzymes No results for input(s): TROPONINI in the last 168  hours. ------------------------------------------------------------  RADIOLOGY:  No results found.     Thank  you for the consultation and for allowing [facility] to assist in the care of your patient. Our recommendations are noted above.  Please contact us if we can be of further service.   Marda Stalker, MD.  Board Certified in Internal Medicine, Pulmonary Medicine, [place], and Sleep Medicine.  [facility] Office Number: [phone]  Patricia Pesa, M.D.  Merton Border, M.D  12/18/2017

## 2017-12-17 NOTE — Addendum Note (Signed)
Addended by: Valora Corporal on: 12/17/2017 02:54 PM   Modules accepted: Orders

## 2017-12-18 ENCOUNTER — Ambulatory Visit (INDEPENDENT_AMBULATORY_CARE_PROVIDER_SITE_OTHER): Payer: Medicare Other | Admitting: Internal Medicine

## 2017-12-18 ENCOUNTER — Encounter: Payer: Self-pay | Admitting: Internal Medicine

## 2017-12-18 VITALS — BP 124/80 | HR 80 | Resp 16 | Ht 62.0 in | Wt 139.0 lb

## 2017-12-18 DIAGNOSIS — J9811 Atelectasis: Secondary | ICD-10-CM

## 2017-12-18 MED ORDER — AMBULATORY NON FORMULARY MEDICATION: 0 refills | Status: AC

## 2017-12-18 NOTE — Patient Instructions (Addendum)
-  Sleep on 3 pillows.  -Flonase 2 sprays in each nostril at night. -Continue reflux medicine.  Avoid eating 4 hours before bedtime. - Use albuterol inhaler 2 puffs at bedtime with a spacer.   Call back in 4 weeks with progress.

## 2017-12-19 NOTE — Telephone Encounter (Signed)
She can take extra diuretic here and there, but not too much She does have underlying COPD which can make her SOB Need to see what pulmonary would like to do.

## 2017-12-21 NOTE — Telephone Encounter (Signed)
Spoke with patients niece per release form and she states that she saw pulmonary and since then she has rested some. She states that they did not feel the fluid is causing any issues with her breathing and they tried some different things to see if that will help. She reports that she has lost some weight, has slept better, and seems to be feeling some better. Reviewed results and recommendations by Dr. Rockey Situ and she verbalized understanding with no further questions at this time. Instructed her to call back if she should have any further questions.

## 2017-12-23 ENCOUNTER — Other Ambulatory Visit: Payer: Self-pay

## 2017-12-23 MED ORDER — WARFARIN SODIUM 5 MG PO TABS: ORAL_TABLET | ORAL | 3 refills | Status: DC

## 2017-12-28 ENCOUNTER — Ambulatory Visit (INDEPENDENT_AMBULATORY_CARE_PROVIDER_SITE_OTHER): Payer: Medicare Other

## 2017-12-28 DIAGNOSIS — I4891 Unspecified atrial fibrillation: Secondary | ICD-10-CM | POA: Diagnosis not present

## 2017-12-28 DIAGNOSIS — Z5181 Encounter for therapeutic drug level monitoring: Secondary | ICD-10-CM

## 2017-12-28 DIAGNOSIS — Z953 Presence of xenogenic heart valve: Secondary | ICD-10-CM | POA: Diagnosis not present

## 2017-12-28 LAB — POCT INR: INR: 3.8

## 2017-12-28 NOTE — Patient Instructions (Signed)
Please hold coumadin tonight, take 1/2 pill tomorrow, then start resume dosage of 1/2 pill daily except 1 pill on Tuesdays, Thursdays and Saturdays. PLEASE BE CONSISTENT WITH YOUR GREEN INTAKE. Recheck in 2 weeks.

## 2018-01-12 ENCOUNTER — Ambulatory Visit (INDEPENDENT_AMBULATORY_CARE_PROVIDER_SITE_OTHER): Payer: Medicare Other | Admitting: *Deleted

## 2018-01-12 DIAGNOSIS — I495 Sick sinus syndrome: Secondary | ICD-10-CM

## 2018-01-13 ENCOUNTER — Encounter (HOSPITAL_COMMUNITY): Payer: Self-pay | Admitting: Emergency Medicine

## 2018-01-13 ENCOUNTER — Other Ambulatory Visit: Payer: Self-pay

## 2018-01-13 ENCOUNTER — Ambulatory Visit (INDEPENDENT_AMBULATORY_CARE_PROVIDER_SITE_OTHER): Payer: Medicare Other

## 2018-01-13 ENCOUNTER — Emergency Department (HOSPITAL_COMMUNITY)
Admission: EM | Admit: 2018-01-13 | Discharge: 2018-01-14 | Disposition: A | Discharge status: Home / Self Care | Payer: Medicare Other | Attending: Emergency Medicine | Admitting: Emergency Medicine

## 2018-01-13 DIAGNOSIS — I4891 Unspecified atrial fibrillation: Secondary | ICD-10-CM

## 2018-01-13 DIAGNOSIS — N390 Urinary tract infection, site not specified: Secondary | ICD-10-CM | POA: Insufficient documentation

## 2018-01-13 DIAGNOSIS — Z23 Encounter for immunization: Secondary | ICD-10-CM | POA: Insufficient documentation

## 2018-01-13 DIAGNOSIS — Y9389 Activity, other specified: Secondary | ICD-10-CM | POA: Insufficient documentation

## 2018-01-13 DIAGNOSIS — S51811A Laceration without foreign body of right forearm, initial encounter: Secondary | ICD-10-CM | POA: Insufficient documentation

## 2018-01-13 DIAGNOSIS — I5032 Chronic diastolic (congestive) heart failure: Secondary | ICD-10-CM | POA: Insufficient documentation

## 2018-01-13 DIAGNOSIS — Z952 Presence of prosthetic heart valve: Secondary | ICD-10-CM | POA: Insufficient documentation

## 2018-01-13 DIAGNOSIS — Z91040 Latex allergy status: Secondary | ICD-10-CM | POA: Diagnosis not present

## 2018-01-13 DIAGNOSIS — Z953 Presence of xenogenic heart valve: Secondary | ICD-10-CM

## 2018-01-13 DIAGNOSIS — Z5181 Encounter for therapeutic drug level monitoring: Secondary | ICD-10-CM | POA: Diagnosis not present

## 2018-01-13 DIAGNOSIS — Y998 Other external cause status: Secondary | ICD-10-CM | POA: Diagnosis not present

## 2018-01-13 DIAGNOSIS — Y92012 Bathroom of single-family (private) house as the place of occurrence of the external cause: Secondary | ICD-10-CM | POA: Diagnosis not present

## 2018-01-13 DIAGNOSIS — Z950 Presence of cardiac pacemaker: Secondary | ICD-10-CM | POA: Insufficient documentation

## 2018-01-13 DIAGNOSIS — W010XXA Fall on same level from slipping, tripping and stumbling without subsequent striking against object, initial encounter: Secondary | ICD-10-CM | POA: Diagnosis not present

## 2018-01-13 DIAGNOSIS — Z7901 Long term (current) use of anticoagulants: Secondary | ICD-10-CM | POA: Diagnosis not present

## 2018-01-13 DIAGNOSIS — I110 Hypertensive heart disease with heart failure: Secondary | ICD-10-CM | POA: Insufficient documentation

## 2018-01-13 DIAGNOSIS — Z8673 Personal history of transient ischemic attack (TIA), and cerebral infarction without residual deficits: Secondary | ICD-10-CM | POA: Diagnosis not present

## 2018-01-13 DIAGNOSIS — Z79899 Other long term (current) drug therapy: Secondary | ICD-10-CM | POA: Diagnosis not present

## 2018-01-13 DIAGNOSIS — S8991XA Unspecified injury of right lower leg, initial encounter: Secondary | ICD-10-CM | POA: Diagnosis present

## 2018-01-13 DIAGNOSIS — S8001XA Contusion of right knee, initial encounter: Secondary | ICD-10-CM | POA: Diagnosis not present

## 2018-01-13 LAB — POCT INR: INR: 3.5

## 2018-01-13 NOTE — Progress Notes (Signed)
Remote pacemaker transmission.   

## 2018-01-13 NOTE — Patient Instructions (Signed)
Please hold coumadin tonight, take 1/2 pill tomorrow, then start new dosage of 1/2 pill daily except 1 pill on Tuesdays and Saturdays. PLEASE BE CONSISTENT WITH YOUR GREEN INTAKE. Recheck in 2 weeks.

## 2018-01-13 NOTE — ED Triage Notes (Signed)
Pt brought in by EMS from home with c/o right knee pain after her unwitnessed fall tonight.  Pt reported that she was brushing her teeth in the bathroom and then just ended up lying on the floor---- pt sated, "I don't remember how I fell".  Pt sustained skin tear to right forearm, bleeding controlled with pressure dressing applied by EMS.  Pt also c/o right knee pain---- splint to right leg applied by EMS.  Pt was given Fentanyl 25 mcg intranasally on scene.

## 2018-01-14 ENCOUNTER — Emergency Department (HOSPITAL_COMMUNITY): Payer: Medicare Other

## 2018-01-14 ENCOUNTER — Telehealth: Payer: Self-pay | Admitting: Cardiovascular Disease

## 2018-01-14 ENCOUNTER — Telehealth: Payer: Self-pay | Admitting: Emergency Medicine

## 2018-01-14 ENCOUNTER — Encounter: Payer: Self-pay | Admitting: Cardiology

## 2018-01-14 DIAGNOSIS — S51811A Laceration without foreign body of right forearm, initial encounter: Secondary | ICD-10-CM | POA: Diagnosis not present

## 2018-01-14 LAB — BASIC METABOLIC PANEL
Anion gap: 12 (ref 5–15)
BUN: 27 mg/dL — AB (ref 6–20)
CALCIUM: 9 mg/dL (ref 8.9–10.3)
CHLORIDE: 96 mmol/L — AB (ref 101–111)
CO2: 29 mmol/L (ref 22–32)
CREATININE: 0.92 mg/dL (ref 0.44–1.00)
GFR calc Af Amer: >60 mL/min (ref >60)
GFR calc non Af Amer: 52 mL/min — ABNORMAL LOW (ref >60)
Glucose, Bld: 151 mg/dL — ABNORMAL HIGH (ref 65–99)
Potassium: 3.6 mmol/L (ref 3.5–5.1)
Sodium: 137 mmol/L (ref 135–145)

## 2018-01-14 LAB — PROTIME-INR
INR: 3.2
Prothrombin Time: 32.5 seconds — ABNORMAL HIGH (ref 11.4–15.2)

## 2018-01-14 LAB — URINALYSIS, ROUTINE W REFLEX MICROSCOPIC
BILIRUBIN URINE: NEGATIVE
Glucose, UA: 150 mg/dL — AB
Hgb urine dipstick: NEGATIVE
KETONES UR: NEGATIVE mg/dL
Nitrite: POSITIVE — AB
PROTEIN: NEGATIVE mg/dL
SPECIFIC GRAVITY, URINE: 1.012 (ref 1.005–1.030)
pH: 5 (ref 5.0–8.0)

## 2018-01-14 LAB — CBC
HEMATOCRIT: 44.9 % (ref 36.0–46.0)
HEMOGLOBIN: 14 g/dL (ref 12.0–15.0)
MCH: 30.1 pg (ref 26.0–34.0)
MCHC: 31.2 g/dL (ref 30.0–36.0)
MCV: 96.6 fL (ref 78.0–100.0)
Platelets: 134 10*3/uL — ABNORMAL LOW (ref 150–400)
RBC: 4.65 MIL/uL (ref 3.87–5.11)
RDW: 13.8 % (ref 11.5–15.5)
WBC: 7.4 10*3/uL (ref 4.0–10.5)

## 2018-01-14 MED ORDER — OXYCODONE HCL 5 MG PO TABS: 2.5000 mg | ORAL_TABLET | Freq: Four times a day (QID) | ORAL | 0 refills | Status: DC | PRN

## 2018-01-14 MED ORDER — BACITRACIN ZINC 500 UNIT/GM EX OINT: 1.0000 "application " | TOPICAL_OINTMENT | Freq: Two times a day (BID) | CUTANEOUS | 0 refills | Status: AC

## 2018-01-14 MED ORDER — CIPROFLOXACIN HCL 500 MG PO TABS
500.0000 mg | ORAL_TABLET | Freq: Once | ORAL | Status: AC
  Administered 2018-01-14: 500 mg via ORAL
  Filled 2018-01-14: qty 1

## 2018-01-14 MED ORDER — TRAMADOL HCL 50 MG PO TABS
50.0000 mg | ORAL_TABLET | Freq: Once | ORAL | Status: AC
  Administered 2018-01-14: 50 mg via ORAL
  Filled 2018-01-14: qty 1

## 2018-01-14 MED ORDER — CIPROFLOXACIN HCL 500 MG PO TABS: 500.0000 mg | ORAL_TABLET | Freq: Two times a day (BID) | ORAL | 0 refills | Status: AC

## 2018-01-14 MED ORDER — TETANUS-DIPHTH-ACELL PERTUSSIS 5-2.5-18.5 LF-MCG/0.5 IM SUSP
0.5000 mL | Freq: Once | INTRAMUSCULAR | Status: AC
  Administered 2018-01-14: 0.5 mL via INTRAMUSCULAR
  Filled 2018-01-14: qty 0.5

## 2018-01-14 MED ORDER — FENTANYL CITRATE (PF) 100 MCG/2ML IJ SOLN
25.0000 ug | Freq: Once | INTRAMUSCULAR | Status: AC
  Administered 2018-01-14: 25 ug via INTRAVENOUS
  Filled 2018-01-14: qty 2

## 2018-01-14 NOTE — ED Notes (Signed)
Pt ambulated well in the hall and to the bathroom with walker (pt uses walker at home)--- pt denies increased pain to right knee while ambulating.

## 2018-01-14 NOTE — Telephone Encounter (Addendum)
Spoke with pt's niece & she stated pt had a fall, went to ER & diagnosed with UTI. Pt was prescribed Oxycodone 5mg  & Cipro 500mg  BID x 7 days earlier this am from ER. Per labs pt's INR in the ER was 3.20 early this AM. Pt's INR was also done in the [place] office on yesterday & she was instructed to hold a dose. Confirmed that pt did hold last night's dose as instructed.  Pt's dose was changed per niece on yesterday to 1/2 pill daily except 1 pill on Tues and Sat.  Instructed her to have pt take 1/2 pill of Warfarin until visit on Monday morning. Pt may have Home Health on Monday but not confirmed at this moment. Niece will call back if they confirm this by Monday.  Niece will call to make an appt in [place] if they don't get Home Health.

## 2018-01-14 NOTE — Telephone Encounter (Signed)
Pt niece Maudie Mercury calling stating patient had a fall last night and went to hospital They did a check on her and she is okay but found out she has a UTI  They placed her on medication Cipro, she was advised to call us for patient is on coumadin  Please advise.

## 2018-01-14 NOTE — Telephone Encounter (Signed)
CM received call from pt's niece, Sima Matas, who states Dr. Ellender Hose said the pt would be set up with home health but she didn't note any information about it on the pt's AVS.  CM advised her that orders were placed for [facility] but that no CM consult had been done so it was good she called.  CM discussed choice and she requested [facility] which the pt has used in the past.  Advised her that someone would likely contact her later today.  Contacted Santiago Glad with [facility] who stated they would accept pt for services.  No further CM needs noted at this time.

## 2018-01-14 NOTE — Care Management Note (Signed)
Case Management Note  Patient Details  Name: ANAPAOLA KINSEL MRN: [id number] Date of Birth: 12-22-1921  Please see telephone encounter.  Expected Discharge Date:   01/14/2018               Expected Discharge Plan:  [place]  Discharge planning Services  CM Consult  Post Acute Care Choice:  Home Health Choice offered to:  (Niece)  HH Arranged:  RN, PT, Nurse's Aide, Speech Therapy HH Agency:  [facility]  Status of Service:  Completed, signed off  Rae Mar, RN 01/14/2018, 2:13 PM

## 2018-01-14 NOTE — ED Provider Notes (Signed)
[facility] DEPT Provider Note   CSN: [id number] Arrival date & time: 01/13/18  2329     History   Chief Complaint Chief Complaint  Patient presents with  . Fall  . Knee Pain    HPI [name] is a 84 y.o. female.  HPI   84 year old female with extensive past medical history as below including A. fib, polymyalgia, aortic insufficiency status post repair, on Coumadin, here with fall.  The patient reportedly took her nightly trazodone and tramadol to go to bed.  She took this earlier than usual.  She stood up to go to the restroom prior to bed.  She was using a walker.  She tried to turn around to sit down on the toilet when she lost her balance.  She fell sideways down to the ground.  There is no direct head injury or collision.  Her legs were reportedly caught under her and she reports immediate onset of right knee pain.  She also sustained a skin tear to the right forearm.  She states she felt tired prior to the fall but this is not unusual after taking her meds.  Denies any chest pain, syncope, or other complaints.  Denies any headache.  Denies any current chest pain or shortness of breath.  No abdominal pain or flank pain.  No numbness or weakness.  Past Medical History:  Diagnosis Date  . AAA (abdominal aortic aneurysm) ([place])    a. 12/2011 U/S: 3.3 cm prox AAA.  [name] Anxiety   . Aortic insufficiency    a. S/P bioprosthetic AVR;  b. 02/2013 Echo: EF 50-55%, mild to mod AI (mean grad 80mmHg).  . Brain aneurysm    Hx of  . Chest pain    a. H/o nonobs cath;  b. 07/2013 Lexiscan MV: EF 66%, no ischemia.  . Chronic diastolic CHF (congestive heart failure) ([place])    a. 02/2013 Echo: EF 50-55%.  . Diverticulosis   . Gastritis   . Hemorrhoids   . Hepatitis B 1980's  . Hiatal hernia   . Hypertension   . Moderate mitral regurgitation    a. 02/2013 Echo: Mod MR.  . Moderate tricuspid regurgitation    a. 02/2013 Echo: Mod TR.  [name] Permanent atrial  fibrillation ([place])    a. Chronic coumadin.  . Polymyalgia rheumatica ([place])   . Pulmonary hypertension ([place])    a. 02/2013 Echo: PASP 70mmHg.  . S/P aortic valve replacement with bioprosthetic valve    a. [facility].  . S/P placement of cardiac pacemaker    a.  08/2007 s/p MDT [place] PPM.  . Seizures ([place])   . Shingles   . Skin cancer    face, ear  and left hand.  . Tachy-brady syndrome ([place])    a. Severe in the setting of permanent atrial fibrillation with tachybrady syndrome;  b. 08/2007 s/p MDT [place] PPM.  . Thoracic aneurysm    a. 10/2011 CTA: 5.0 x 3.8 cm.    Patient Active Problem List   Diagnosis Date Noted  . Anxiety disorder 07/29/2017  . Hypertension 07/29/2017  . Polymyalgia rheumatica ([place]) 07/29/2017  . Left knee pain 04/21/2017  . Pharyngoesophageal phase dysphagia 04/20/2017  . Multifocal pneumonia 04/17/2017  . Acute respiratory failure with hypoxia ([place]) 04/15/2017  . Hemoptysis   . Thoracic aortic aneurysm without rupture ([place]) 11/21/2016  . Complete heart block ([place]) 08/20/2016  . CHF (congestive heart failure) ([place]) 05/10/2016  . Acute respiratory  failure ([place]) 05/10/2016  . Rheumatoid arthritis ([place]) 05/10/2016  . Insomnia 05/10/2016  . Nausea 03/31/2016  . Pain in the muscles 08/31/2015  . S/P AAA repair 03/27/2015  . Tachy-brady syndrome ([place]) 03/27/2015  . Constipation 03/27/2015  . Dizziness 11/01/2014  . Pneumonia, organism unspecified([place]) 11/01/2014  . Fatigue 02/27/2014  . Pulmonary hypertension ([place]) 01/26/2014  . Encounter for therapeutic drug monitoring 12/15/2013  . Bronchitis 12/15/2013  . Frequent PVCs 12/15/2013  . Exertional angina ([place]) 07/07/2013  . Bradycardia 07/07/2013  . Ulcer of other part of lower limb 12/28/2012  . Hyperlipidemia 12/26/2010  . TIA 12/26/2010  . PACEMAKER-Medtronic 05/07/2010  . S/P aortic valve replacement with bioprosthetic valve 01/15/2010  . MURMUR 01/15/2010  . DIASTOLIC  HEART FAILURE, CHRONIC 10/26/2009  . PVD 07/17/2009  . ATRIAL FIBRILLATION 06/19/2009  . DIZZINESS AND GIDDINESS 06/19/2009    Past Surgical History:  Procedure Laterality Date  . ABDOMINAL AORTIC ANEURYSM REPAIR     Resection  . AORTIC VALVE REPLACEMENT     With bioprosthetic valve  . APPENDECTOMY    . BREAST BIOPSY    . CEREBRAL ANEURYSM REPAIR    . CHOLECYSTECTOMY    . EP IMPLANTABLE DEVICE N/A 08/20/2016   Procedure: PPM Generator Changeout;  Surgeon: Deboraha Sprang, MD;  Location: [facility];  Service: Cardiovascular;  Laterality: N/A;  . LUNG BIOPSY  2005  . PACEMAKER PLACEMENT  08/2007   Medtronic; AF permanent; s/p pacemaker for bradycardia with now well controlled ventricular response; Digoxin therapy, question level; Lower extremity venous issues  . skin cancer removal     ear, face and hand  . VESICOVAGINAL FISTULA CLOSURE W/ TAH      OB History    Gravida Para Term Preterm AB Living   0 0 0 0 0 0   SAB TAB Ectopic Multiple Live Births   0 0 0 0         Home Medications    Prior to Admission medications   Medication Sig Start Date End Date Taking? Authorizing Provider  acetaminophen (TYLENOL) 500 MG tablet Take 1 tablet (500 mg total) by mouth every 6 (six) hours. 04/23/17   Johnson, Clanford L, MD  albuterol (PROVENTIL HFA;VENTOLIN HFA) 108 (90 Base) MCG/ACT inhaler Inhale 1 puff into the lungs every 6 (six) hours as needed for wheezing or shortness of breath.    [provider]  AMBULATORY NON FORMULARY MEDICATION Medication Name: AeroChamber DME:[facility] JW:J19.14 12/18/17   Laverle Hobby, MD  bacitracin ointment Apply 1 application topically 2 (two) times daily. Apply to skin tears, then place a non-stick dressing 01/14/18   Duffy Bruce, MD  Brinzolamide-Brimonidine [facility]) 1-0.2 % SUSP Place 1 drop into both eyes daily.     [provider]  budesonide-formoterol (SYMBICORT) 160-4.5 MCG/ACT inhaler Inhale 1 puff into the  lungs 2 (two) times daily.    [provider]  busPIRone (BUSPAR) 7.5 MG tablet Take 7.5 mg by mouth 2 (two) times daily.    [provider]  ciprofloxacin (CIPRO) 500 MG tablet Take 1 tablet (500 mg total) by mouth 2 (two) times daily for 7 days. 01/14/18 01/21/18  Duffy Bruce, MD  diclofenac sodium (VOLTAREN) 1 % GEL Apply 4 g topically 2 (two) times daily.    [provider]  flintstones complete (FLINTSTONES) 60 MG chewable tablet Chew 1 tablet by mouth daily.    [provider]  latanoprost (XALATAN) 0.005 % ophthalmic solution Place 1 drop into both eyes  at bedtime.    [provider]  LORazepam (ATIVAN) 0.5 MG tablet Take 1 tablet (0.5 mg total) by mouth every 8 (eight) hours as needed for anxiety. 04/23/17   Johnson, Clanford L, MD  meclizine (ANTIVERT) 25 MG tablet Take 12.5 mg by mouth 3 (three) times daily as needed for dizziness.    [provider]  oxyCODONE (ROXICODONE) 5 MG immediate release tablet Take 0.5 tablets (2.5 mg total) by mouth every 6 (six) hours as needed for severe pain. 01/14/18   Duffy Bruce, MD  pantoprazole (PROTONIX) 40 MG tablet Take 40 mg by mouth daily.     [provider]  polyethylene glycol (MIRALAX / GLYCOLAX) packet Take 17 g by mouth daily as needed for moderate constipation. Patient taking differently: Take 17 g by mouth daily.  04/23/17   Johnson, Clanford L, MD  potassium chloride SA (K-DUR,KLOR-CON) 20 MEQ tablet TAKE ONE TABLET BY MOUTH EACH EVENING 12/16/16   Minna Merritts, MD  torsemide (DEMADEX) 20 MG tablet Take 1.5 tablets (30 mg total) by mouth daily. Patient taking differently: Take 20 mg by mouth daily.  04/23/17   Johnson, Clanford L, MD  traMADol (ULTRAM) 50 MG tablet Take 1 tablet (50 mg total) by mouth every 6 (six) hours as needed for moderate pain. 04/23/17   Johnson, Clanford L, MD  traZODone (DESYREL) 50 MG tablet Take 50 mg by mouth at bedtime.     [provider]    warfarin (COUMADIN) 5 MG tablet TAKE AS DIRECTED BY THE COUMADIN CLINIC 12/23/17   Minna Merritts, MD    Family History Family History  Problem Relation Age of Onset  . Cancer Mother   . Heart disease Mother   . Hypertension Mother   . Varicose Veins Mother   . Heart disease Father   . Hypertension Father   . Heart disease Sister   . Hypertension Sister   . Varicose Veins Sister   . AAA (abdominal aortic aneurysm) Sister   . Heart disease Brother   . Hypertension Brother     Social History Social History   Tobacco Use  . Smoking status: Never Smoker  . Smokeless tobacco: Never Used  Substance Use Topics  . Alcohol use: No  . Drug use: No     Allergies   Fluticasone-salmeterol; Furosemide; Iodine; Iohexol; Sulfonamide derivatives; Verapamil; Macrobid [nitrofurantoin monohyd macro]; Salmeterol; Latex; and Penicillins   Review of Systems Review of Systems  Constitutional: Positive for fatigue. Negative for chills and fever.  HENT: Negative for congestion, rhinorrhea and sore throat.   Eyes: Negative for visual disturbance.  Respiratory: Negative for cough, shortness of breath and wheezing.   Cardiovascular: Negative for chest pain and leg swelling.  Gastrointestinal: Negative for abdominal pain, diarrhea, nausea and vomiting.  Genitourinary: Positive for frequency. Negative for dysuria, flank pain, vaginal bleeding and vaginal discharge.  Musculoskeletal: Negative for neck pain.  Skin: Positive for wound. Negative for rash.  Allergic/Immunologic: Negative for immunocompromised state.  Neurological: Negative for syncope and headaches.  Hematological: Does not bruise/bleed easily.  All other systems reviewed and are negative.    Physical Exam Updated Vital Signs BP (!) 152/73   Pulse 64   Temp 97.9 F (36.6 C) (Oral)   Resp 18   Ht 5\' 4"  (1.626 m)   Wt 60.3 kg (133 lb)   SpO2 92%   BMI 22.83 kg/m   Physical Exam  Constitutional: She is oriented to  person, place, and time. She  appears well-developed and well-nourished. No distress.  Very well-appearing, in no distress  HENT:  Head: Normocephalic and atraumatic.  Eyes: Conjunctivae are normal.  Neck: Neck supple.  No midline or paraspinal tenderness  Cardiovascular: Normal rate, regular rhythm and normal heart sounds. Exam reveals no friction rub.  No murmur heard. Pulmonary/Chest: Effort normal and breath sounds normal. No respiratory distress. She has no wheezes. She has no rales.  Abdominal: Soft. Bowel sounds are normal. She exhibits no distension. There is tenderness (Mild, suprapubic). There is no rebound and no guarding.  Musculoskeletal: She exhibits no edema.  Neurological: She is alert and oriented to person, place, and time. She exhibits normal muscle tone.  Skin: Skin is warm. Capillary refill takes less than 2 seconds.  Large skin tear to the right dorsal forearm.  No exposed subcutaneous tissue without apparent exposure of muscle or bone.  Moderate tissue deficit.  Psychiatric: She has a normal mood and affect.  Nursing note and vitals reviewed.   LOWER EXTREMITY EXAM: RIGHT  INSPECTION & PALPATION: Moderate TTP and swelling over anterior knee with bruising. No open wounds.  SENSORY: sensation is intact to light touch in:  Superficial peroneal nerve distribution (over dorsum of foot) Deep peroneal nerve distribution (over first dorsal web space) Sural nerve distribution (over lateral aspect 5th metatarsal) Saphenous nerve distribution (over medial instep)  MOTOR:  + Motor EHL (great toe dorsiflexion) + FHL (great toe plantar flexion)  + TA (ankle dorsiflexion)  + GSC (ankle plantar flexion)  VASCULAR: 2+ dorsalis pedis and posterior tibialis pulses Capillary refill < 2 sec, toes warm and well-perfused  COMPARTMENTS: Soft, warm, well-perfused No pain with passive extension No parethesias    ED Treatments / Results  Labs (all labs ordered are listed,  but only abnormal results are displayed) Labs Reviewed  CBC - Abnormal; Notable for the following components:      Result Value   Platelets 134 (*)    All other components within normal limits  BASIC METABOLIC PANEL - Abnormal; Notable for the following components:   Chloride 96 (*)    Glucose, Bld 151 (*)    BUN 27 (*)    GFR calc non Af Amer 52 (*)    All other components within normal limits  PROTIME-INR - Abnormal; Notable for the following components:   Prothrombin Time 32.5 (*)    All other components within normal limits  URINALYSIS, ROUTINE W REFLEX MICROSCOPIC - Abnormal; Notable for the following components:   Glucose, UA 150 (*)    Nitrite POSITIVE (*)    Leukocytes, UA MODERATE (*)    Bacteria, UA RARE (*)    Squamous Epithelial / LPF 0-5 (*)    All other components within normal limits  URINE CULTURE    EKG  EKG Interpretation  Date/Time:  Thursday January 14 2018 00:03:44 EST Ventricular Rate:  65 PR Interval:    QRS Duration: 145 QT Interval:  491 QTC Calculation: 511 R Axis:   109 Text Interpretation:  Sinus rhythm Short PR interval Probable left atrial enlargement Nonspecific intraventricular conduction delay Lateral infarct, old Probable anteroseptal infarct, recent Artifact in lead(s) I II III aVR aVL aVF V1 No significant change since last tracing Confirmed by Duffy Bruce [phone]) on 01/14/2018 4:21:52 AM       Radiology Dg Forearm Right  Result Date: 01/14/2018 CLINICAL DATA:  Fall.  Right forearm pain. EXAM: RIGHT FOREARM - 2 VIEW COMPARISON:  None. FINDINGS: There is no evidence of fracture or  other focal bone lesions. Soft tissues are unremarkable. IMPRESSION: Negative. Electronically Signed   By: Rolm Baptise M.D.   On: 01/14/2018 01:20   Dg Ankle Complete Right  Result Date: 01/14/2018 CLINICAL DATA:  Fall.  Lateral ankle swelling and pain EXAM: RIGHT ANKLE - COMPLETE 3+ VIEW COMPARISON:  None. FINDINGS: Diffuse osteopenia. No acute bony  abnormality. Specifically, no fracture, subluxation, or dislocation. IMPRESSION: No acute bony abnormality. Electronically Signed   By: Rolm Baptise M.D.   On: 01/14/2018 01:19   Ct Head Wo Contrast  Result Date: 01/14/2018 CLINICAL DATA:  Fall EXAM: CT HEAD WITHOUT CONTRAST TECHNIQUE: Contiguous axial images were obtained from the base of the skull through the vertex without intravenous contrast. COMPARISON:  02/21/2016 FINDINGS: Brain: Mild age related volume loss. No acute intracranial abnormality. Specifically, no hemorrhage, hydrocephalus, mass lesion, acute infarction, or significant intracranial injury. Vascular: No hyperdense vessel or unexpected calcification. Skull: No acute calvarial abnormality. Sinuses/Orbits: Visualized paranasal sinuses and mastoids clear. Orbital soft tissues unremarkable. Other: None IMPRESSION: No acute intracranial abnormality. Electronically Signed   By: Rolm Baptise M.D.   On: 01/14/2018 01:21   Dg Knee Complete 4 Views Right  Result Date: 01/14/2018 CLINICAL DATA:  84 y/o  F; fall with posterior knee pain. EXAM: RIGHT KNEE - COMPLETE 4+ VIEW COMPARISON:  None. FINDINGS: No acute fracture, dislocation, or joint effusion identified. Severe medial and moderate lateral femorotibial compartment joint space narrowing. Tricompartmental osteophytosis. Vascular calcifications. IMPRESSION: 1.  No acute fracture or dislocation identified. 2. Osteoarthrosis with severe medial and moderate lateral femorotibial compartment joint space narrowing. Electronically Signed   By: Kristine Garbe M.D.   On: 01/14/2018 01:19   Dg Hip Unilat W Or Wo Pelvis 2-3 Views Right  Result Date: 01/14/2018 CLINICAL DATA:  Fall.  Right hip pain. EXAM: DG HIP (WITH OR WITHOUT PELVIS) 2-3V RIGHT COMPARISON:  None. FINDINGS: No acute bony abnormality. Specifically, no fracture, subluxation, or dislocation. Hip joints and SI joints are symmetric and unremarkable. IMPRESSION: No acute bony  abnormality. Electronically Signed   By: Rolm Baptise M.D.   On: 01/14/2018 01:19    Procedures Procedures (including critical care time)  Medications Ordered in ED Medications  traMADol (ULTRAM) tablet 50 mg (not administered)  ciprofloxacin (CIPRO) tablet 500 mg (not administered)  fentaNYL (SUBLIMAZE) injection 25 mcg (25 mcg Intravenous Given 01/14/18 0039)  Tdap (BOOSTRIX) injection 0.5 mL (0.5 mLs Intramuscular Given 01/14/18 0218)     Initial Impression / Assessment and Plan / ED Course  I have reviewed the triage vital signs and the nursing notes.  Pertinent labs & imaging results that were available during my care of the patient were reviewed by me and considered in my medical decision making (see chart for details).     84 year old female here with mechanical fall and subsequent right forearm and right knee pain.  Regarding her trauma, CT head is negative.  Plain films negative for fracture.  For her skin tear, this was dressed after thorough cleaning.  She has a large tissue deficit that is not amenable to primary closure at this time.  Discussed that she will need to follow-up with her PCP in 48 hours and I have placed an order for home health assistance.  Regarding her knee pain, she is now ambulatory without significant difficulty.  Doubt tibial plateau or other occult fracture.  Continue supportive care.  Regarding her fall, the patient was walking in the dark, using a walker, after taking sedating medications.  Discussed that she should not walk after taking her nightly medications.  She strongly denies any syncopal episode and EKG here is unremarkable, with reassuring lab work and I do not suspect syncopal episode or arrhythmia or other medical etiology for her fall.  Discussed fall precautions in the home.  Of note, patient did report some urinary frequency with history of frequent UTIs.  UA consistent with possible mild UTI and will start her on Cipro based on her medication  allergies.  She is aware that she will need to follow-up with your Coumadin clinic while taking antibiotics.  Otherwise, the patient does have some moderate pain.  She takes tramadol at home.  I urged her to continue this as needed and will give her a very low dose of oxycodone for severe pain.  She will follow-up with her PCP on Friday.  Return precautions given.  Final Clinical Impressions(s) / ED Diagnoses   Final diagnoses:  Skin tear of right forearm without complication, initial encounter  Contusion of right knee, initial encounter  Lower urinary tract infection    ED Discharge Orders        Ordered    oxyCODONE (ROXICODONE) 5 MG immediate release tablet  Every 6 hours PRN     01/14/18 0415    ciprofloxacin (CIPRO) 500 MG tablet  2 times daily     01/14/18 0415    bacitracin ointment  2 times daily     01/14/18 0415       Duffy Bruce, MD 01/14/18 [phone]

## 2018-01-16 LAB — URINE CULTURE
Culture: >=100000 — AB
Special Requests: NORMAL

## 2018-01-17 ENCOUNTER — Telehealth: Payer: Self-pay

## 2018-01-17 NOTE — Telephone Encounter (Signed)
Post ED Visit - Positive Culture Follow-up  Culture report reviewed by antimicrobial stewardship pharmacist:  []  Elenor Quinones, Pharm.D. []  Heide Guile, Pharm.D., BCPS AQ-ID []  Parks Neptune, Pharm.D., BCPS [x]  Alycia Rossetti, Pharm.D., BCPS []  [place], [place].D., BCPS, AAHIVP []  Legrand Como, Pharm.D., BCPS, AAHIVP []  Salome Arnt, PharmD, BCPS []  Jalene Mullet, PharmD []  Vincenza Hews, PharmD, BCPS  Positive urine culture Treated with Ciprofloxcin, organism sensitive to the same and no further patient follow-up is required at this time.  Genia Del 01/17/2018, 10:41 AM

## 2018-01-18 ENCOUNTER — Telehealth: Payer: Self-pay | Admitting: Cardiovascular Disease

## 2018-01-18 NOTE — Telephone Encounter (Signed)
Spoke w/  Davy Pique, RN w/ [facility]. She thought pt's [place] appt was today, but it is actually tomorrow.  Gave verbal order for INR check tomorrow and she will call [facility] for dosing. Provided my direct # and requested that they visit pt on Mondays or Wednesdays and call me w/ readings. She is agreeable and appreciative of the call.

## 2018-01-18 NOTE — Telephone Encounter (Signed)
Nurse with [facility] needs order PT./INR to be done today, they will be at pt home.

## 2018-01-19 ENCOUNTER — Ambulatory Visit (INDEPENDENT_AMBULATORY_CARE_PROVIDER_SITE_OTHER): Payer: Medicare Other | Admitting: Interventional Cardiology

## 2018-01-19 DIAGNOSIS — Z5181 Encounter for therapeutic drug level monitoring: Secondary | ICD-10-CM

## 2018-01-19 DIAGNOSIS — I4891 Unspecified atrial fibrillation: Secondary | ICD-10-CM | POA: Diagnosis not present

## 2018-01-19 DIAGNOSIS — Z953 Presence of xenogenic heart valve: Secondary | ICD-10-CM

## 2018-01-19 LAB — POCT INR: INR: 1.8

## 2018-01-25 ENCOUNTER — Ambulatory Visit (INDEPENDENT_AMBULATORY_CARE_PROVIDER_SITE_OTHER): Payer: Medicare Other

## 2018-01-25 DIAGNOSIS — Z953 Presence of xenogenic heart valve: Secondary | ICD-10-CM | POA: Diagnosis not present

## 2018-01-25 DIAGNOSIS — Z5181 Encounter for therapeutic drug level monitoring: Secondary | ICD-10-CM | POA: Diagnosis not present

## 2018-01-25 DIAGNOSIS — I4891 Unspecified atrial fibrillation: Secondary | ICD-10-CM

## 2018-01-25 LAB — POCT INR: INR: 1.8

## 2018-01-25 NOTE — Patient Instructions (Signed)
Today take 1.5 tablets today and tomorrow, then continue taking  1/2 pill daily except 1 pill on Tuesdays and Saturdays. PLEASE BE CONSISTENT WITH YOUR GREEN. Recheck on Monday.

## 2018-01-29 LAB — CUP PACEART REMOTE DEVICE CHECK
Battery Remaining Longevity: 98 mo
Battery Voltage: 2.78 V
Brady Statistic RV Percent Paced: 99 %
Implantable Lead Implant Date: 20081003
Implantable Lead Location: 753860
Implantable Lead Model: 5076
Lead Channel Impedance Value: 0 Ohm
Lead Channel Setting Pacing Amplitude: 2.5 V
Lead Channel Setting Pacing Pulse Width: 0.4 ms
MDC IDC MSMT BATTERY IMPEDANCE: 227 Ohm
MDC IDC MSMT LEADCHNL RV IMPEDANCE VALUE: 636 Ohm
MDC IDC MSMT LEADCHNL RV PACING THRESHOLD AMPLITUDE: 0.75 V
MDC IDC MSMT LEADCHNL RV PACING THRESHOLD PULSEWIDTH: 0.4 ms
MDC IDC PG IMPLANT DT: 20171004
MDC IDC SESS DTM: 20190226174725
MDC IDC SET LEADCHNL RV SENSING SENSITIVITY: 2.8 mV

## 2018-02-01 ENCOUNTER — Ambulatory Visit (INDEPENDENT_AMBULATORY_CARE_PROVIDER_SITE_OTHER): Payer: Medicare Other

## 2018-02-01 DIAGNOSIS — Z5181 Encounter for therapeutic drug level monitoring: Secondary | ICD-10-CM

## 2018-02-01 DIAGNOSIS — I4891 Unspecified atrial fibrillation: Secondary | ICD-10-CM | POA: Diagnosis not present

## 2018-02-01 DIAGNOSIS — Z953 Presence of xenogenic heart valve: Secondary | ICD-10-CM

## 2018-02-01 LAB — POCT INR: INR: 3.6

## 2018-02-01 NOTE — Patient Instructions (Signed)
Spoke w/ Olivia Mackie, [facility] RN w/ [facility]. Advised her to have pt hold coumadin tonight, then continue taking  1/2 pill daily except 1 pill on Tuesdays and Saturdays. PLEASE BE CONSISTENT WITH YOUR GREENS. Recheck in 1 week.

## 2018-02-03 ENCOUNTER — Telehealth: Payer: Self-pay | Admitting: Cardiovascular Disease

## 2018-02-03 DIAGNOSIS — Z79899 Other long term (current) drug therapy: Secondary | ICD-10-CM

## 2018-02-03 DIAGNOSIS — I509 Heart failure, unspecified: Secondary | ICD-10-CM

## 2018-02-03 DIAGNOSIS — I4891 Unspecified atrial fibrillation: Secondary | ICD-10-CM

## 2018-02-03 NOTE — Telephone Encounter (Signed)
Spoke with patients daughter per release form. Advised patient to increase Torsemide 20 mg to 2 tablets once daily for 3 days to see if that will help reduce her swelling and help with her breathing. Instructed her to go back to Torsemide 20 mg 1 tablet once daily after the 3 days. Reviewed that she has venous insufficiency and needs to wear compression hose and/or ace wraps to help keep the fluid and swelling to a minimum. Requested that she continue to monitor her weights daily and to give [place] a call back if they should not improve or if it worsens. Scheduled her to come in and see Dr. Rockey Situ on 02/15/18 at 3:20 PM and told her that send message over to Dr. Rockey Situ for his review and if any different recommendations I would give her a call back. She verbalized understanding with no further questions at this time.

## 2018-02-03 NOTE — Telephone Encounter (Signed)
Spoke with patients daughter and she states that patient had increased weight of 5 pounds and has 2 dark bruises to her shins. She states that she has had shortness of breath and used her rescue inhalers. She reports her current weight of 138.2 lbs and I see that her previous weight was 139 lbs. She reports that her mother is taking torsemide 20 mg once daily and this was reduced previously due to elevated kidney numbers. Reviewed current labs and kidney numbers appear to be stable. Patients daughter had to go and advised that I would give her a call back. She was appreciative.

## 2018-02-03 NOTE — Telephone Encounter (Signed)
Pt c/o swelling: STAT is pt has developed SOB within 24 hours  1) How much weight have you gained and in what time span? 4-5 pounds in the last week  2) If swelling, where is the swelling located? Both legs and feet  3) Are you currently taking a fluid pill? yes  4) Are you currently SOB? Some yes, has had to use her rescue inhalers  5) Do you have a log of your daily weights (if so, list)? Does not have available  Past 3 days has been 138.8 , 138.4 and 138.2  6) Have you gained 3 pounds in a day or 5 pounds in a week? yes  Have you traveled recently? no  Also developed dark black bruises on her shin and legs

## 2018-02-05 NOTE — Telephone Encounter (Signed)
S/w patient's DPR who verbalized understanding of plan of care. Patient is currently receiving [facility] home [facility] and she would like them to draw the lab if possible. Nurse is coming to see the patient today and she will let them know and reach out to [place] with further instructions on orders. We discussed having it drawn next Thursday or Friday at home in order to allow result enough time to be able to bring to appointment on 02/15/18 with [facility].

## 2018-02-05 NOTE — Telephone Encounter (Signed)
Agree with the extra torsemide as detailed We can check lab work when she comes in for visit

## 2018-02-08 ENCOUNTER — Telehealth: Payer: Self-pay | Admitting: Internal Medicine

## 2018-02-08 ENCOUNTER — Ambulatory Visit (INDEPENDENT_AMBULATORY_CARE_PROVIDER_SITE_OTHER): Payer: Medicare Other

## 2018-02-08 ENCOUNTER — Telehealth: Payer: Self-pay | Admitting: Cardiovascular Disease

## 2018-02-08 DIAGNOSIS — Z5181 Encounter for therapeutic drug level monitoring: Secondary | ICD-10-CM

## 2018-02-08 DIAGNOSIS — I4891 Unspecified atrial fibrillation: Secondary | ICD-10-CM

## 2018-02-08 DIAGNOSIS — Z953 Presence of xenogenic heart valve: Secondary | ICD-10-CM

## 2018-02-08 DIAGNOSIS — I509 Heart failure, unspecified: Secondary | ICD-10-CM

## 2018-02-08 DIAGNOSIS — R0602 Shortness of breath: Secondary | ICD-10-CM

## 2018-02-08 DIAGNOSIS — Z79899 Other long term (current) drug therapy: Secondary | ICD-10-CM

## 2018-02-08 LAB — POCT INR: INR: 2.8

## 2018-02-08 NOTE — Telephone Encounter (Signed)
Tracey aware cxr needed.

## 2018-02-08 NOTE — Telephone Encounter (Signed)
S/w daughter, Joelene Millin. Lab for BMET re-entered so [facility]. She was appreciative.

## 2018-02-08 NOTE — Telephone Encounter (Signed)
Daughter called back and patient is going to [facility] for labs on 02/09/18.

## 2018-02-08 NOTE — Telephone Encounter (Signed)
[facility] called patient is not in distress. Lung fields clear She does have cough and sob. Her 02 sats are 89%. Patient offered early am appt but declined will be seen tomorrow at 4:15. Nothing further needed.

## 2018-02-08 NOTE — Telephone Encounter (Signed)
Nurse with [facility] states oxygen is 89, cannot catch breath and has no relief with symbicort or inhaler

## 2018-02-08 NOTE — Telephone Encounter (Signed)
Lets get a 2 view CXR before she is seen here.

## 2018-02-08 NOTE — Telephone Encounter (Signed)
Patient wants to have bmp drawn at [facility] lab while she is here tomorrow.

## 2018-02-08 NOTE — Patient Instructions (Signed)
Spoke w/ Olivia Mackie, [facility] RN w/ [facility]. Advised her continue taking  1/2 pill daily except 1 pill on Tuesdays and Saturdays. PLEASE BE CONSISTENT WITH YOUR GREEN.  Recheck in 1 week at appt w/ Dr. Rockey Situ.

## 2018-02-09 ENCOUNTER — Ambulatory Visit
Admission: RE | Admit: 2018-02-09 | Discharge: 2018-02-09 | Disposition: A | Discharge status: Home / Self Care | Payer: Medicare Other | Source: Ambulatory Visit | Attending: Internal Medicine | Admitting: Internal Medicine

## 2018-02-09 ENCOUNTER — Ambulatory Visit (INDEPENDENT_AMBULATORY_CARE_PROVIDER_SITE_OTHER): Payer: Medicare Other | Admitting: Internal Medicine

## 2018-02-09 ENCOUNTER — Encounter: Payer: Self-pay | Admitting: Internal Medicine

## 2018-02-09 ENCOUNTER — Other Ambulatory Visit
Admission: RE | Admit: 2018-02-09 | Discharge: 2018-02-09 | Disposition: A | Discharge status: Home / Self Care | Payer: Medicare Other | Source: Ambulatory Visit | Attending: Cardiovascular Disease | Admitting: Cardiovascular Disease

## 2018-02-09 VITALS — BP 122/74 | HR 79 | Resp 16 | Ht 64.0 in | Wt 137.0 lb

## 2018-02-09 DIAGNOSIS — I509 Heart failure, unspecified: Secondary | ICD-10-CM

## 2018-02-09 DIAGNOSIS — I712 Thoracic aortic aneurysm, without rupture: Secondary | ICD-10-CM | POA: Insufficient documentation

## 2018-02-09 DIAGNOSIS — J441 Chronic obstructive pulmonary disease with (acute) exacerbation: Secondary | ICD-10-CM | POA: Diagnosis not present

## 2018-02-09 DIAGNOSIS — J90 Pleural effusion, not elsewhere classified: Secondary | ICD-10-CM | POA: Diagnosis not present

## 2018-02-09 DIAGNOSIS — I700 Atherosclerosis of aorta: Secondary | ICD-10-CM | POA: Diagnosis not present

## 2018-02-09 DIAGNOSIS — R0602 Shortness of breath: Secondary | ICD-10-CM

## 2018-02-09 DIAGNOSIS — I4891 Unspecified atrial fibrillation: Secondary | ICD-10-CM

## 2018-02-09 DIAGNOSIS — J9811 Atelectasis: Secondary | ICD-10-CM | POA: Insufficient documentation

## 2018-02-09 DIAGNOSIS — Z952 Presence of prosthetic heart valve: Secondary | ICD-10-CM | POA: Insufficient documentation

## 2018-02-09 DIAGNOSIS — Z79899 Other long term (current) drug therapy: Secondary | ICD-10-CM

## 2018-02-09 LAB — BASIC METABOLIC PANEL
Anion gap: 12 (ref 5–15)
BUN: 33 mg/dL — ABNORMAL HIGH (ref 6–20)
CHLORIDE: 94 mmol/L — AB (ref 101–111)
CO2: 30 mmol/L (ref 22–32)
CREATININE: 1.17 mg/dL — AB (ref 0.44–1.00)
Calcium: 8.6 mg/dL — ABNORMAL LOW (ref 8.9–10.3)
GFR calc non Af Amer: 39 mL/min — ABNORMAL LOW (ref >60)
GFR, EST AFRICAN AMERICAN: 45 mL/min — AB (ref >60)
Glucose, Bld: 198 mg/dL — ABNORMAL HIGH (ref 65–99)
POTASSIUM: 3.6 mmol/L (ref 3.5–5.1)
Sodium: 136 mmol/L (ref 135–145)

## 2018-02-09 MED ORDER — IPRATROPIUM-ALBUTEROL 0.5-2.5 (3) MG/3ML IN SOLN: 3.0000 mL | Freq: Three times a day (TID) | RESPIRATORY_TRACT | 1 refills | Status: DC

## 2018-02-09 MED ORDER — IPRATROPIUM-ALBUTEROL 0.5-2.5 (3) MG/3ML IN SOLN: 3.0000 mL | Freq: Four times a day (QID) | RESPIRATORY_TRACT | 11 refills | Status: DC | PRN

## 2018-02-09 NOTE — Progress Notes (Signed)
[facility] Consultation      Assessment and Plan:  Nocturnal dyspnea, chronic pulmonary edema-pleural effusions secondary to volume overload.. -Etiology is uncertain, could be multifactorial from fluid shift, PND, pleural effusion, GERD, postnasal drip. - I have asked the patient's niece to get her a wedge to sleep on which may help reduce the fluid shift.  -We will start nasal steroid 2 sprays in each nostril at night. -Continue taking PPI.  Avoid eating 4 hours before bedtime. - Use albuterol inhaler at bedtime, her technique was watched today and she would benefit from a spacer.   Acute bronchitis.  Possible viral syndrome. - Chest x-ray showed no evidence of pneumonia.  She is prescribed a nebulizer today with duo nebs to be used 3 times daily until her breathing is feeling better, then can decrease nebulizer to as needed.  Orders Placed This Encounter  Procedures  . AMB REFERRAL FOR DME   Meds ordered this encounter  Medications  . ipratropium-albuterol (DUONEB) 0.5-2.5 (3) MG/3ML SOLN    Sig: Take 3 mLs by nebulization 3 (three) times daily. Do until your breathing improves, then cut back to as needed.    Dispense:  360 mL    Refill:  1   Return in about 3 months (around 05/12/2018).   Date: 02/09/2018  MRN# [id number] [name] [birth year]   [name] is a 84 y.o. old female seen in consultation for chief complaint of:    Chief Complaint  Patient presents with  . Acute Visit    increased sob02 sat 89%; feels like a lump in her throat. She had cxr today.[name]    HPI:   The patient is a 84 year old female with a history of diastolic dysfunction, atrial fibrillation status post pacemaker.  She has a history of bioprosthetic aortic valve repair in 2013 she is present here with her Niece who gives some of the history.  She continues to have episodic dyspnea, often wakes at at night dyspnea. On most nights she wakes up and has to use her rescue  inhaler. She is also noted to have anxiety, and when she gets winded they check her oxygen it is normal but she will still feels that she can not get her breath.  The episodes at night usually occur about 2 to 3 am. She also wakes 2 to 3 times per night spontaneously.  She uses Symbicort 80, 1 puff twice daily, and last visit she was asked to use a spacer.  She was asked to sleep on a wedge, to reduce her symptoms of nocturnal dyspnea, she was started empirically on a nasal steroid in case this was contributing to her nocturnal dyspnea.  Since her last visit her orthopnea symptoms had improved, but that the past few weeks they got worse again and she is again waking up at night with dyspnea. She has is back to using her rescue inhaler.  She has been having more phlegm in her throat but no cough, she has been having nausea and stomach upset.  The rescue inhaler has been helping, she does not have a nebulizer.  She has been on torsemide 20 mg daily, and was increased to 40 mg daily for 3 days by Dr. Rockey Situ, which did not seem to help.    She does not snore at night, she has never been diagnosed with OSA.   Imaging personally reviewed, chest x-ray 02/09/18 right base small pleural effusion, atelectasis, minimal change from previous on 04/15/17.  CT chest 04/15/18 small to moderate right-sided pleural effusion, large aortic aneurysm with compression of surrounding lung, pulmonary edema, area rounded atelectasis versus loculated fluid in right lung posteriorly.  Medication:    Current Outpatient Medications:  .  acetaminophen (TYLENOL) 500 MG tablet, Take 1 tablet (500 mg total) by mouth every 6 (six) hours., Disp: 30 tablet, Rfl: 0 .  albuterol (PROVENTIL HFA;VENTOLIN HFA) 108 (90 Base) MCG/ACT inhaler, Inhale 1 puff into the lungs every 6 (six) hours as needed for wheezing or shortness of breath., Disp: , Rfl:  .  AMBULATORY NON FORMULARY MEDICATION, Medication Name: AeroChamber DME:[facility] DX:J98.11, Disp:  1 each, Rfl: 0 .  bacitracin ointment, Apply 1 application topically 2 (two) times daily. Apply to skin tears, then place a non-stick dressing, Disp: 120 g, Rfl: 0 .  Brinzolamide-Brimonidine (SIMBRINZA) 1-0.2 % SUSP, Place 1 drop into both eyes daily. , Disp: , Rfl:  .  budesonide-formoterol (SYMBICORT) 160-4.5 MCG/ACT inhaler, Inhale 1 puff into the lungs 2 (two) times daily., Disp: , Rfl:  .  busPIRone (BUSPAR) 7.5 MG tablet, Take 7.5 mg by mouth 2 (two) times daily., Disp: , Rfl:  .  diclofenac sodium (VOLTAREN) 1 % GEL, Apply 4 g topically 2 (two) times daily., Disp: , Rfl:  .  flintstones complete (FLINTSTONES) 60 MG chewable tablet, Chew 1 tablet by mouth daily., Disp: , Rfl:  .  latanoprost (XALATAN) 0.005 % ophthalmic solution, Place 1 drop into both eyes at bedtime., Disp: , Rfl:  .  LORazepam (ATIVAN) 0.5 MG tablet, Take 1 tablet (0.5 mg total) by mouth every 8 (eight) hours as needed for anxiety., Disp: 12 tablet, Rfl: 0 .  meclizine (ANTIVERT) 25 MG tablet, Take 12.5 mg by mouth 3 (three) times daily as needed for dizziness., Disp: , Rfl:  .  oxyCODONE (ROXICODONE) 5 MG immediate release tablet, Take 0.5 tablets (2.5 mg total) by mouth every 6 (six) hours as needed for severe pain., Disp: 10 tablet, Rfl: 0 .  pantoprazole (PROTONIX) 40 MG tablet, Take 40 mg by mouth daily. , Disp: , Rfl:  .  polyethylene glycol (MIRALAX / GLYCOLAX) packet, Take 17 g by mouth daily as needed for moderate constipation. (Patient taking differently: Take 17 g by mouth daily. ), Disp: 14 each, Rfl: 0 .  potassium chloride SA (K-DUR,KLOR-CON) 20 MEQ tablet, TAKE ONE TABLET BY MOUTH EACH EVENING, Disp: 90 tablet, Rfl: 3 .  torsemide (DEMADEX) 20 MG tablet, Take 1.5 tablets (30 mg total) by mouth daily. (Patient taking differently: Take 20 mg by mouth daily. ), Disp: , Rfl:  .  traMADol (ULTRAM) 50 MG tablet, Take 1 tablet (50 mg total) by mouth every 6 (six) hours as needed for moderate pain., Disp: 30 tablet,  Rfl: 0 .  traZODone (DESYREL) 50 MG tablet, Take 50 mg by mouth at bedtime. , Disp: , Rfl:  .  warfarin (COUMADIN) 5 MG tablet, TAKE AS DIRECTED BY THE COUMADIN CLINIC, Disp: 30 tablet, Rfl: 3   Allergies:  Fluticasone-salmeterol; Furosemide; Iodine; Iohexol; Sulfonamide derivatives; Verapamil; Macrobid [nitrofurantoin monohyd macro]; Salmeterol; Latex; and Penicillins  Review of Systems: Gen:  Denies  fever, sweats, chills HEENT: Denies blurred vision, double vision. bleeds, sore throat Cvc:  No dizziness, chest pain. Resp:   Denies cough  Gi: Denies swallowing difficulty, stomach pain. Gu:  Denies bladder incontinence, burning urine Ext:   No Joint pain, stiffness. Skin: No skin rash,  hives  Endoc:  No polyuria, polydipsia. Psych: No depression, insomnia. Other:  All other systems were reviewed with the patient and were negative other that what is mentioned in the HPI.   Physical Examination:   VS: BP 122/74 (BP Location: Left Arm, Cuff Size: Normal)   Pulse 79   Resp 16   Ht 5\' 4"  (1.626 m)   Wt 137 lb (62.1 kg)   SpO2 (!) 88%   BMI 23.52 kg/m   General Appearance: No distress  Neuro:without focal findings,  speech normal,  HEENT: PERRLA, EOM intact.   Pulmonary: normal breath sounds, No wheezing.  CardiovascularNormal S1,S2.  No m/r/g.   Abdomen: Benign, Soft, non-tender. Renal:  No costovertebral tenderness  GU:  No performed at this time. Endoc: No evident thyromegaly, no signs of acromegaly. Skin:   warm, no rashes, no ecchymosis  Extremities: normal, no cyanosis, clubbing.  Other findings:    LABORATORY PANEL:   CBC No results for input(s): WBC, HGB, HCT, PLT in the last 168 hours. ------------------------------------------------------------------------------------------------------------------  Chemistries  No results for input(s): NA, K, CL, CO2, GLUCOSE, BUN, CREATININE, CALCIUM, MG, AST, ALT, ALKPHOS, BILITOT in the last 168 hours.  Invalid  input(s): GFRCGP ------------------------------------------------------------------------------------------------------------------  Cardiac Enzymes No results for input(s): TROPONINI in the last 168 hours. ------------------------------------------------------------  RADIOLOGY:  No results found.     Thank  you for the consultation and for allowing [facility] to assist in the care of your patient. Our recommendations are noted above.  Please contact us if we can be of further service.   Marda Stalker, MD.  Board Certified in Internal Medicine, Pulmonary Medicine, [place], and Sleep Medicine.  [facility] Office Number: [phone]  Patricia Pesa, M.D.  Merton Border, M.D  02/09/2018

## 2018-02-09 NOTE — Patient Instructions (Addendum)
Will start nebulizer three times per day until feeling better, then cut back to as needed.  You may have a gastrointestinal virus, treat the symptoms with pepto as needed. Symptoms should resolve in one week, if not should be evaluated further.

## 2018-02-09 NOTE — Addendum Note (Signed)
Addended by: Stephanie Coup on: 02/09/2018 05:10 PM   Modules accepted: Orders

## 2018-02-10 ENCOUNTER — Telehealth: Payer: Self-pay | Admitting: Internal Medicine

## 2018-02-10 MED ORDER — IPRATROPIUM BROMIDE 0.02 % IN SOLN: 0.5000 mg | Freq: Four times a day (QID) | RESPIRATORY_TRACT | 11 refills | Status: DC

## 2018-02-10 MED ORDER — ALBUTEROL SULFATE (2.5 MG/3ML) 0.083% IN NEBU: 2.5000 mg | INHALATION_SOLUTION | Freq: Four times a day (QID) | RESPIRATORY_TRACT | 0 refills | Status: DC | PRN

## 2018-02-10 MED ORDER — IPRATROPIUM BROMIDE 0.02 % IN SOLN: 0.5000 mg | Freq: Four times a day (QID) | RESPIRATORY_TRACT | 0 refills | Status: DC

## 2018-02-10 MED ORDER — ALBUTEROL SULFATE (2.5 MG/3ML) 0.083% IN NEBU: INHALATION_SOLUTION | RESPIRATORY_TRACT | 11 refills | Status: DC

## 2018-02-10 NOTE — Telephone Encounter (Signed)
[name] notified of medication change. Nothing further needed.

## 2018-02-10 NOTE — Telephone Encounter (Signed)
Called [facility] to check and see if medication went thru if ran separately. Per Linus Orn they did. Awaiting call back from [place] to notify.

## 2018-02-10 NOTE — Telephone Encounter (Signed)
Returned call to Maudie Mercury to notify Duoneb medication was sent as 2 separate medications. Left voicemail.

## 2018-02-10 NOTE — Telephone Encounter (Signed)
Kim calling for patient and they are having issues with insurance regarding the prescription that was sent yesterday Please call to discuss

## 2018-02-11 ENCOUNTER — Telehealth: Payer: Self-pay | Admitting: Internal Medicine

## 2018-02-11 NOTE — Telephone Encounter (Signed)
Pt niece states that when she went to pick up nebulizer medication, she received 2 different boxes , Albuterol and Ipratropium, with instructions to take them separately 4 times a day. Please call to verify if this is correct.

## 2018-02-11 NOTE — Telephone Encounter (Signed)
Returned call to patient's niece and advised per last office visit:  Will start nebulizer three times per day until feeling better, then cut back to as needed.  You may have a gastrointestinal virus, treat the symptoms with pepto as needed. Symptoms should resolve in one week, if not should be evaluated further.    Nothing further needed.

## 2018-02-11 NOTE — Telephone Encounter (Signed)
Called [facility]. Unable to reach. Left message with option 3 to give [place] a call back.

## 2018-02-11 NOTE — Telephone Encounter (Signed)
Please call regarding medication for nebulizer. States [facility] will not pay for as needed directions. Please call.

## 2018-02-12 ENCOUNTER — Telehealth: Payer: Self-pay | Admitting: Internal Medicine

## 2018-02-12 ENCOUNTER — Encounter (HOSPITAL_COMMUNITY): Payer: Self-pay | Admitting: *Deleted

## 2018-02-12 ENCOUNTER — Inpatient Hospital Stay (HOSPITAL_COMMUNITY)
Admission: EM | Admit: 2018-02-12 | Discharge: 2018-02-15 | DRG: 292 | Disposition: A | Discharge status: Home Health | Payer: Medicare Other | Attending: Internal Medicine | Admitting: Internal Medicine

## 2018-02-12 ENCOUNTER — Emergency Department (HOSPITAL_COMMUNITY): Payer: Medicare Other

## 2018-02-12 DIAGNOSIS — Z882 Allergy status to sulfonamides status: Secondary | ICD-10-CM

## 2018-02-12 DIAGNOSIS — Z91041 Radiographic dye allergy status: Secondary | ICD-10-CM | POA: Diagnosis not present

## 2018-02-12 DIAGNOSIS — R0600 Dyspnea, unspecified: Secondary | ICD-10-CM | POA: Diagnosis not present

## 2018-02-12 DIAGNOSIS — Z886 Allergy status to analgesic agent status: Secondary | ICD-10-CM

## 2018-02-12 DIAGNOSIS — Z7901 Long term (current) use of anticoagulants: Secondary | ICD-10-CM

## 2018-02-12 DIAGNOSIS — Z881 Allergy status to other antibiotic agents status: Secondary | ICD-10-CM | POA: Diagnosis not present

## 2018-02-12 DIAGNOSIS — Z8619 Personal history of other infectious and parasitic diseases: Secondary | ICD-10-CM | POA: Diagnosis not present

## 2018-02-12 DIAGNOSIS — J9811 Atelectasis: Secondary | ICD-10-CM | POA: Diagnosis present

## 2018-02-12 DIAGNOSIS — I482 Chronic atrial fibrillation: Secondary | ICD-10-CM | POA: Diagnosis present

## 2018-02-12 DIAGNOSIS — Z91040 Latex allergy status: Secondary | ICD-10-CM

## 2018-02-12 DIAGNOSIS — Z7951 Long term (current) use of inhaled steroids: Secondary | ICD-10-CM

## 2018-02-12 DIAGNOSIS — Z8679 Personal history of other diseases of the circulatory system: Secondary | ICD-10-CM

## 2018-02-12 DIAGNOSIS — Z953 Presence of xenogenic heart valve: Secondary | ICD-10-CM | POA: Diagnosis not present

## 2018-02-12 DIAGNOSIS — Z79891 Long term (current) use of opiate analgesic: Secondary | ICD-10-CM | POA: Diagnosis not present

## 2018-02-12 DIAGNOSIS — I509 Heart failure, unspecified: Secondary | ICD-10-CM | POA: Diagnosis not present

## 2018-02-12 DIAGNOSIS — N179 Acute kidney failure, unspecified: Secondary | ICD-10-CM | POA: Diagnosis present

## 2018-02-12 DIAGNOSIS — R0902 Hypoxemia: Secondary | ICD-10-CM | POA: Diagnosis present

## 2018-02-12 DIAGNOSIS — Z8673 Personal history of transient ischemic attack (TIA), and cerebral infarction without residual deficits: Secondary | ICD-10-CM

## 2018-02-12 DIAGNOSIS — Z9049 Acquired absence of other specified parts of digestive tract: Secondary | ICD-10-CM | POA: Diagnosis not present

## 2018-02-12 DIAGNOSIS — I5033 Acute on chronic diastolic (congestive) heart failure: Secondary | ICD-10-CM | POA: Diagnosis present

## 2018-02-12 DIAGNOSIS — Z950 Presence of cardiac pacemaker: Secondary | ICD-10-CM

## 2018-02-12 DIAGNOSIS — J449 Chronic obstructive pulmonary disease, unspecified: Secondary | ICD-10-CM | POA: Diagnosis present

## 2018-02-12 DIAGNOSIS — I361 Nonrheumatic tricuspid (valve) insufficiency: Secondary | ICD-10-CM | POA: Diagnosis not present

## 2018-02-12 DIAGNOSIS — Z8249 Family history of ischemic heart disease and other diseases of the circulatory system: Secondary | ICD-10-CM | POA: Diagnosis not present

## 2018-02-12 DIAGNOSIS — I480 Paroxysmal atrial fibrillation: Secondary | ICD-10-CM | POA: Diagnosis present

## 2018-02-12 DIAGNOSIS — R739 Hyperglycemia, unspecified: Secondary | ICD-10-CM | POA: Diagnosis present

## 2018-02-12 DIAGNOSIS — I272 Pulmonary hypertension, unspecified: Secondary | ICD-10-CM | POA: Diagnosis present

## 2018-02-12 DIAGNOSIS — Z79899 Other long term (current) drug therapy: Secondary | ICD-10-CM

## 2018-02-12 DIAGNOSIS — Z888 Allergy status to other drugs, medicaments and biological substances status: Secondary | ICD-10-CM | POA: Diagnosis not present

## 2018-02-12 DIAGNOSIS — I5031 Acute diastolic (congestive) heart failure: Secondary | ICD-10-CM | POA: Diagnosis present

## 2018-02-12 DIAGNOSIS — I340 Nonrheumatic mitral (valve) insufficiency: Secondary | ICD-10-CM | POA: Diagnosis not present

## 2018-02-12 DIAGNOSIS — Z951 Presence of aortocoronary bypass graft: Secondary | ICD-10-CM

## 2018-02-12 DIAGNOSIS — Z880 Allergy status to penicillin: Secondary | ICD-10-CM

## 2018-02-12 DIAGNOSIS — I10 Essential (primary) hypertension: Secondary | ICD-10-CM | POA: Diagnosis present

## 2018-02-12 DIAGNOSIS — Z85828 Personal history of other malignant neoplasm of skin: Secondary | ICD-10-CM | POA: Diagnosis not present

## 2018-02-12 DIAGNOSIS — E876 Hypokalemia: Secondary | ICD-10-CM | POA: Diagnosis present

## 2018-02-12 DIAGNOSIS — M353 Polymyalgia rheumatica: Secondary | ICD-10-CM | POA: Diagnosis present

## 2018-02-12 DIAGNOSIS — I110 Hypertensive heart disease with heart failure: Secondary | ICD-10-CM | POA: Diagnosis present

## 2018-02-12 DIAGNOSIS — Z7952 Long term (current) use of systemic steroids: Secondary | ICD-10-CM

## 2018-02-12 DIAGNOSIS — K219 Gastro-esophageal reflux disease without esophagitis: Secondary | ICD-10-CM | POA: Diagnosis present

## 2018-02-12 LAB — BASIC METABOLIC PANEL
Anion gap: 13 (ref 5–15)
BUN: 26 mg/dL — ABNORMAL HIGH (ref 6–20)
CALCIUM: 8.9 mg/dL (ref 8.9–10.3)
CO2: 28 mmol/L (ref 22–32)
CREATININE: 1.12 mg/dL — AB (ref 0.44–1.00)
Chloride: 96 mmol/L — ABNORMAL LOW (ref 101–111)
GFR calc Af Amer: 48 mL/min — ABNORMAL LOW (ref >60)
GFR, EST NON AFRICAN AMERICAN: 41 mL/min — AB (ref >60)
GLUCOSE: 205 mg/dL — AB (ref 65–99)
Potassium: 4.4 mmol/L (ref 3.5–5.1)
Sodium: 137 mmol/L (ref 135–145)

## 2018-02-12 LAB — CBC
HCT: 42.8 % (ref 36.0–46.0)
HEMOGLOBIN: 13.3 g/dL (ref 12.0–15.0)
MCH: 29.5 pg (ref 26.0–34.0)
MCHC: 31.1 g/dL (ref 30.0–36.0)
MCV: 94.9 fL (ref 78.0–100.0)
PLATELETS: 149 10*3/uL — AB (ref 150–400)
RBC: 4.51 MIL/uL (ref 3.87–5.11)
RDW: 14.7 % (ref 11.5–15.5)
WBC: 6.1 10*3/uL (ref 4.0–10.5)

## 2018-02-12 LAB — I-STAT TROPONIN, ED: TROPONIN I, POC: 0.06 ng/mL (ref 0.00–0.08)

## 2018-02-12 LAB — PROTIME-INR
INR: 3.01
PROTHROMBIN TIME: 31 s — AB (ref 11.4–15.2)

## 2018-02-12 LAB — TROPONIN I
Troponin I: 0.06 ng/mL (ref <0.03)
Troponin I: 0.07 ng/mL (ref <0.03)

## 2018-02-12 LAB — BRAIN NATRIURETIC PEPTIDE: B Natriuretic Peptide: 137.6 pg/mL — ABNORMAL HIGH (ref 0.0–100.0)

## 2018-02-12 MED ORDER — WARFARIN - PHARMACIST DOSING INPATIENT
Freq: Every day | Status: DC
  Administered 2018-02-14: 17:00:00

## 2018-02-12 MED ORDER — POTASSIUM CHLORIDE CRYS ER 20 MEQ PO TBCR
20.0000 meq | EXTENDED_RELEASE_TABLET | Freq: Every day | ORAL | Status: DC
  Administered 2018-02-12 – 2018-02-15 (×4): 20 meq via ORAL
  Filled 2018-02-12 (×4): qty 1

## 2018-02-12 MED ORDER — IPRATROPIUM BROMIDE 0.02 % IN SOLN
0.5000 mg | Freq: Three times a day (TID) | RESPIRATORY_TRACT | Status: DC
  Administered 2018-02-13 – 2018-02-15 (×7): 0.5 mg via RESPIRATORY_TRACT
  Filled 2018-02-12 (×8): qty 2.5

## 2018-02-12 MED ORDER — SODIUM CHLORIDE 0.9 % IV SOLN: 250.0000 mL | INTRAVENOUS | Status: DC | PRN

## 2018-02-12 MED ORDER — PAROXETINE HCL 10 MG PO TABS
10.0000 mg | ORAL_TABLET | Freq: Every day | ORAL | Status: DC
  Administered 2018-02-13 – 2018-02-15 (×3): 10 mg via ORAL
  Filled 2018-02-12 (×3): qty 1

## 2018-02-12 MED ORDER — POLYETHYLENE GLYCOL 3350 17 G PO PACK
17.0000 g | PACK | Freq: Every day | ORAL | Status: DC
  Administered 2018-02-13 – 2018-02-15 (×3): 17 g via ORAL
  Filled 2018-02-12 (×3): qty 1

## 2018-02-12 MED ORDER — MECLIZINE HCL 25 MG PO TABS: 12.5000 mg | ORAL_TABLET | Freq: Three times a day (TID) | ORAL | Status: DC | PRN

## 2018-02-12 MED ORDER — PANTOPRAZOLE SODIUM 40 MG PO TBEC
40.0000 mg | DELAYED_RELEASE_TABLET | Freq: Every day | ORAL | Status: DC
  Administered 2018-02-13 – 2018-02-15 (×3): 40 mg via ORAL
  Filled 2018-02-12 (×3): qty 1

## 2018-02-12 MED ORDER — ALBUTEROL SULFATE (2.5 MG/3ML) 0.083% IN NEBU: 2.5000 mg | INHALATION_SOLUTION | Freq: Three times a day (TID) | RESPIRATORY_TRACT | Status: DC

## 2018-02-12 MED ORDER — TORSEMIDE 20 MG PO TABS
60.0000 mg | ORAL_TABLET | Freq: Every day | ORAL | Status: DC
  Administered 2018-02-13 – 2018-02-14 (×2): 60 mg via ORAL
  Filled 2018-02-12 (×2): qty 3

## 2018-02-12 MED ORDER — MOMETASONE FURO-FORMOTEROL FUM 200-5 MCG/ACT IN AERO: 2.0000 | INHALATION_SPRAY | Freq: Two times a day (BID) | RESPIRATORY_TRACT | Status: DC

## 2018-02-12 MED ORDER — LATANOPROST 0.005 % OP SOLN
1.0000 [drp] | Freq: Every day | OPHTHALMIC | Status: DC
  Administered 2018-02-12 – 2018-02-14 (×3): 1 [drp] via OPHTHALMIC
  Filled 2018-02-12: qty 2.5

## 2018-02-12 MED ORDER — TRAMADOL HCL 50 MG PO TABS
25.0000 mg | ORAL_TABLET | Freq: Every day | ORAL | Status: DC
Start: 2018-02-12 — End: 2018-02-15
  Administered 2018-02-12 – 2018-02-14 (×3): 25 mg via ORAL
  Filled 2018-02-12 (×3): qty 1

## 2018-02-12 MED ORDER — PREDNISONE 5 MG PO TABS
7.5000 mg | ORAL_TABLET | Freq: Every day | ORAL | Status: DC
  Administered 2018-02-13 – 2018-02-15 (×3): 7.5 mg via ORAL
  Filled 2018-02-12 (×3): qty 2

## 2018-02-12 MED ORDER — TORSEMIDE 20 MG PO TABS
60.0000 mg | ORAL_TABLET | Freq: Once | ORAL | Status: AC
  Administered 2018-02-12: 60 mg via ORAL
  Filled 2018-02-12: qty 3

## 2018-02-12 MED ORDER — ACETAMINOPHEN 325 MG PO TABS: 650.0000 mg | ORAL_TABLET | Freq: Four times a day (QID) | ORAL | Status: DC | PRN

## 2018-02-12 MED ORDER — SODIUM CHLORIDE 0.9% FLUSH: 3.0000 mL | INTRAVENOUS | Status: DC | PRN

## 2018-02-12 MED ORDER — TRAZODONE HCL 150 MG PO TABS
75.0000 mg | ORAL_TABLET | Freq: Every day | ORAL | Status: DC
  Administered 2018-02-12 – 2018-02-14 (×3): 75 mg via ORAL
  Filled 2018-02-12 (×3): qty 1

## 2018-02-12 MED ORDER — ALBUTEROL SULFATE (2.5 MG/3ML) 0.083% IN NEBU: 2.5000 mg | INHALATION_SOLUTION | Freq: Four times a day (QID) | RESPIRATORY_TRACT | Status: DC | PRN

## 2018-02-12 MED ORDER — ALBUTEROL SULFATE HFA 108 (90 BASE) MCG/ACT IN AERS: 1.0000 | INHALATION_SPRAY | Freq: Four times a day (QID) | RESPIRATORY_TRACT | Status: DC | PRN

## 2018-02-12 MED ORDER — PNEUMOCOCCAL VAC POLYVALENT 25 MCG/0.5ML IJ INJ
0.5000 mL | INJECTION | INTRAMUSCULAR | Status: DC
  Filled 2018-02-12: qty 0.5

## 2018-02-12 MED ORDER — LORAZEPAM 0.5 MG PO TABS
0.5000 mg | ORAL_TABLET | Freq: Three times a day (TID) | ORAL | Status: DC
  Administered 2018-02-12 – 2018-02-15 (×9): 0.5 mg via ORAL
  Filled 2018-02-12 (×9): qty 1

## 2018-02-12 MED ORDER — FUROSEMIDE 10 MG/ML IJ SOLN: 60.0000 mg | Freq: Every day | INTRAMUSCULAR | Status: DC

## 2018-02-12 MED ORDER — ACETAMINOPHEN 650 MG RE SUPP: 650.0000 mg | Freq: Four times a day (QID) | RECTAL | Status: DC | PRN

## 2018-02-12 MED ORDER — SODIUM CHLORIDE 0.9% FLUSH
3.0000 mL | Freq: Two times a day (BID) | INTRAVENOUS | Status: DC
  Administered 2018-02-12 – 2018-02-15 (×6): 3 mL via INTRAVENOUS

## 2018-02-12 MED ORDER — MOMETASONE FURO-FORMOTEROL FUM 200-5 MCG/ACT IN AERO
2.0000 | INHALATION_SPRAY | Freq: Two times a day (BID) | RESPIRATORY_TRACT | Status: DC
  Administered 2018-02-13 – 2018-02-15 (×5): 2 via RESPIRATORY_TRACT
  Filled 2018-02-12: qty 8.8

## 2018-02-12 NOTE — ED Provider Notes (Signed)
[facility] EMERGENCY DEPARTMENT Provider Note   CSN: [id number] Arrival date & time: 02/12/18  1431   History   Chief Complaint Chief Complaint  Patient presents with  . Shortness of Breath   HPI [name] is a 84 y.o. female with past medical history significant for diastolic HF, A. fib, aortic insufficiency status post aortic valve replacement, AAA status post repair, tachy-brachy on pacemaker and COPD who presented with dyspnea for 2 weeks.  Patient is here with her niece who contributed to history.  Patient reports dyspnea for 2 weeks.  She presented to her pulmonologist about 3 days ago and was started on DuoNeb for possible viral bronchitis.  She reports using her DuoNeb and the other breathing treatments.  She also reports good compliance with a torsemide and warfarin.  She had desaturation to low 80s last night and mid 80s this morning that prompted them to call the pulmonologist who recommended going to ED.  She also reports edema, decreased urine output over the last 2 days and about 2 pound weight gain recently.  She says she has baseline 2 pillow orthopnea.  Denies PND but reports nocturnal dyspnea and desaturation.  She also reports a sensation of lump in her throat for 1 week.  She says she has severe reflux.  She denies cough, rhinorrhea, congestion, fever, chest pain, emesis, diarrhea or dysuria.  She admits some nausea and low abdominal pain.   HPI  Past Medical History:  Diagnosis Date  . AAA (abdominal aortic aneurysm) ([place])    a. 12/2011 U/S: 3.3 cm prox AAA.  [name] Anxiety   . Aortic insufficiency    a. S/P bioprosthetic AVR;  b. 02/2013 Echo: EF 50-55%, mild to mod AI (mean grad 1mmHg).  . Brain aneurysm    Hx of  . Chest pain    a. H/o nonobs cath;  b. 07/2013 Lexiscan MV: EF 66%, no ischemia.  . Chronic diastolic CHF (congestive heart failure) ([place])    a. 02/2013 Echo: EF 50-55%.  . Diverticulosis   . Gastritis   . Hemorrhoids   .  Hepatitis B 1980's  . Hiatal hernia   . Hypertension   . Moderate mitral regurgitation    a. 02/2013 Echo: Mod MR.  . Moderate tricuspid regurgitation    a. 02/2013 Echo: Mod TR.  [name] Permanent atrial fibrillation ([place])    a. Chronic coumadin.  . Polymyalgia rheumatica ([place])   . Pulmonary hypertension ([place])    a. 02/2013 Echo: PASP 98mmHg.  . S/P aortic valve replacement with bioprosthetic valve    a. [facility].  . S/P placement of cardiac pacemaker    a.  08/2007 s/p MDT [place] PPM.  . Seizures ([place])   . Shingles   . Skin cancer    face, ear  and left hand.  . Tachy-brady syndrome ([place])    a. Severe in the setting of permanent atrial fibrillation with tachybrady syndrome;  b. 08/2007 s/p MDT [place] PPM.  . Thoracic aneurysm    a. 10/2011 CTA: 5.0 x 3.8 cm.    Patient Active Problem List   Diagnosis Date Noted  . Anxiety disorder 07/29/2017  . Hypertension 07/29/2017  . Polymyalgia rheumatica ([place]) 07/29/2017  . Left knee pain 04/21/2017  . Pharyngoesophageal phase dysphagia 04/20/2017  . Multifocal pneumonia 04/17/2017  . Acute respiratory failure with hypoxia ([place]) 04/15/2017  . Hemoptysis   . Thoracic aortic aneurysm without rupture ([place]) 11/21/2016  . Complete  heart block ([place]) 08/20/2016  . CHF (congestive heart failure) ([place]) 05/10/2016  . Acute respiratory failure ([place]) 05/10/2016  . Rheumatoid arthritis ([place]) 05/10/2016  . Insomnia 05/10/2016  . Nausea 03/31/2016  . Pain in the muscles 08/31/2015  . S/P AAA repair 03/27/2015  . Tachy-brady syndrome ([place]) 03/27/2015  . Constipation 03/27/2015  . Dizziness 11/01/2014  . Pneumonia, organism unspecified(486) 11/01/2014  . Fatigue 02/27/2014  . Pulmonary hypertension ([place]) 01/26/2014  . Encounter for therapeutic drug monitoring 12/15/2013  . Bronchitis 12/15/2013  . Frequent PVCs 12/15/2013  . Exertional angina ([place]) 07/07/2013  . Bradycardia 07/07/2013  . Ulcer of other part of lower  limb 12/28/2012  . Hyperlipidemia 12/26/2010  . TIA 12/26/2010  . PACEMAKER-Medtronic 05/07/2010  . S/P aortic valve replacement with bioprosthetic valve 01/15/2010  . MURMUR 01/15/2010  . DIASTOLIC HEART FAILURE, CHRONIC 10/26/2009  . PVD 07/17/2009  . ATRIAL FIBRILLATION 06/19/2009  . DIZZINESS AND GIDDINESS 06/19/2009    Past Surgical History:  Procedure Laterality Date  . ABDOMINAL AORTIC ANEURYSM REPAIR     Resection  . AORTIC VALVE REPLACEMENT     With bioprosthetic valve  . APPENDECTOMY    . BREAST BIOPSY    . CEREBRAL ANEURYSM REPAIR    . CHOLECYSTECTOMY    . EP IMPLANTABLE DEVICE N/A 08/20/2016   Procedure: PPM Generator Changeout;  Surgeon: Deboraha Sprang, MD;  Location: [facility];  Service: Cardiovascular;  Laterality: N/A;  . LUNG BIOPSY  2005  . PACEMAKER PLACEMENT  08/2007   Medtronic; AF permanent; s/p pacemaker for bradycardia with now well controlled ventricular response; Digoxin therapy, question level; Lower extremity venous issues  . skin cancer removal     ear, face and hand  . VESICOVAGINAL FISTULA CLOSURE W/ TAH       OB History    Gravida  0   Para  0   Term  0   Preterm  0   AB  0   Living  0     SAB  0   TAB  0   Ectopic  0   Multiple  0   Live Births               Home Medications    Prior to Admission medications   Medication Sig Start Date End Date Taking? Authorizing Provider  acetaminophen (TYLENOL) 500 MG tablet Take 1 tablet (500 mg total) by mouth every 6 (six) hours. 04/23/17   Johnson, Clanford L, MD  albuterol (PROVENTIL HFA;VENTOLIN HFA) 108 (90 Base) MCG/ACT inhaler Inhale 1 puff into the lungs every 6 (six) hours as needed for wheezing or shortness of breath.    [provider]  albuterol (PROVENTIL) (2.5 MG/3ML) 0.083% nebulizer solution Inhale 1 vial via nebulizer every 6 hours as needed .J44.9 02/10/18   Laverle Hobby, MD  AMBULATORY NON FORMULARY MEDICATION Medication Name:  AeroChamber DME:[facility] ZJ:I96.78 12/18/17   Laverle Hobby, MD  bacitracin ointment Apply 1 application topically 2 (two) times daily. Apply to skin tears, then place a non-stick dressing 01/14/18   Duffy Bruce, MD  Brinzolamide-Brimonidine [facility]) 1-0.2 % SUSP Place 1 drop into both eyes daily.     [provider]  budesonide-formoterol (SYMBICORT) 160-4.5 MCG/ACT inhaler Inhale 1 puff into the lungs 2 (two) times daily.    [provider]  diclofenac sodium (VOLTAREN) 1 % GEL Apply 4 g topically 2 (two) times daily.    [provider]  flintstones complete (FLINTSTONES) 60 MG chewable  tablet Chew 1 tablet by mouth daily.    [provider]  ipratropium (ATROVENT) 0.02 % nebulizer solution Take 2.5 mLs (0.5 mg total) by nebulization 4 (four) times daily. As needed J44.9 02/10/18   Laverle Hobby, MD  latanoprost (XALATAN) 0.005 % ophthalmic solution Place 1 drop into both eyes at bedtime.    [provider]  LORazepam (ATIVAN) 0.5 MG tablet Take 1 tablet (0.5 mg total) by mouth every 8 (eight) hours as needed for anxiety. 04/23/17   Johnson, Clanford L, MD  meclizine (ANTIVERT) 25 MG tablet Take 12.5 mg by mouth 3 (three) times daily as needed for dizziness.    [provider]  oxyCODONE (ROXICODONE) 5 MG immediate release tablet Take 0.5 tablets (2.5 mg total) by mouth every 6 (six) hours as needed for severe pain. 01/14/18   Duffy Bruce, MD  pantoprazole (PROTONIX) 40 MG tablet Take 40 mg by mouth daily.     [provider]  PARoxetine (PAXIL) 10 MG tablet Take 10 mg by mouth daily. 01/26/18   [provider]  polyethylene glycol (MIRALAX / GLYCOLAX) packet Take 17 g by mouth daily as needed for moderate constipation. Patient taking differently: Take 17 g by mouth daily.  04/23/17   Johnson, Clanford L, MD  potassium chloride SA (K-DUR,KLOR-CON) 20 MEQ tablet TAKE ONE TABLET BY MOUTH EACH EVENING 12/16/16    Minna Merritts, MD  predniSONE (DELTASONE) 5 MG tablet Take 7.5 mg by mouth daily. 02/01/18   [provider]  torsemide (DEMADEX) 20 MG tablet Take 1.5 tablets (30 mg total) by mouth daily. Patient taking differently: Take 20 mg by mouth daily.  04/23/17   Johnson, Clanford L, MD  traMADol (ULTRAM) 50 MG tablet Take 1 tablet (50 mg total) by mouth every 6 (six) hours as needed for moderate pain. 04/23/17   Johnson, Clanford L, MD  traZODone (DESYREL) 50 MG tablet Take 50 mg by mouth at bedtime.     [provider]  warfarin (COUMADIN) 5 MG tablet TAKE AS DIRECTED BY THE COUMADIN CLINIC 12/23/17   Minna Merritts, MD    Family History Family History  Problem Relation Age of Onset  . Cancer Mother   . Heart disease Mother   . Hypertension Mother   . Varicose Veins Mother   . Heart disease Father   . Hypertension Father   . Heart disease Sister   . Hypertension Sister   . Varicose Veins Sister   . AAA (abdominal aortic aneurysm) Sister   . Heart disease Brother   . Hypertension Brother     Social History Social History   Tobacco Use  . Smoking status: Never Smoker  . Smokeless tobacco: Never Used  Substance Use Topics  . Alcohol use: No  . Drug use: No     Allergies   Fluticasone-salmeterol; Furosemide; Iodine; Iohexol; Sulfonamide derivatives; Verapamil; Macrobid [nitrofurantoin monohyd macro]; Salmeterol; Latex; and Penicillins   Review of Systems Review of Systems  Constitutional: Negative for fatigue and fever.  HENT: Negative for congestion, rhinorrhea, trouble swallowing and voice change.   Eyes: Negative for visual disturbance.  Respiratory: Positive for shortness of breath. Negative for cough and chest tightness.   Cardiovascular: Positive for leg swelling. Negative for chest pain and palpitations.  Gastrointestinal: Positive for abdominal pain and nausea. Negative for blood in stool, diarrhea and vomiting.  Genitourinary: Positive for decreased  urine volume. Negative for difficulty urinating, dysuria and hematuria.  Musculoskeletal: Negative for back pain and  myalgias.  Skin: Negative for rash.  Neurological: Negative for dizziness and light-headedness.  Hematological: Negative for adenopathy. Does not bruise/bleed easily.  Psychiatric/Behavioral: Negative for dysphoric mood.   Physical Exam Updated Vital Signs BP 140/86 (BP Location: Left Arm)   Pulse 77   Temp 98 F (36.7 C) (Oral)   Resp 18   Ht 5\' 3"  (1.6 m)   Wt 62.1 kg (137 lb)   LMP  (Approximate)   SpO2 90%   BMI 24.27 kg/m   Physical Exam GEN: appears well & comfortable. No apparent distress. Head: normocephalic and atraumatic  Eyes: conjunctiva without injection. Sclera anicteric. Oropharynx: MMM. No erythema. No exudation or petechiae.  Uvula midline HEM: negative for cervical or periauricular lymphadenopathies CVS: RRR, nl s1 & s2, 2/6 SEM over LUSB, + edema, JVD to head Joe RESP: Lying in bed comfortably.  On 2 L by [place].  No IWOB, good air movement bilaterally, rale bilaterally GI: BS present & normal, soft, tenderness to palpation across lower abdomen, no rebound no palpable mass GU: no suprapubic or CVA tenderness MSK: no focal tenderness or notable swelling SKIN: Skin bruise over the posterior aspect of her calf.  No increased warmth to touch ENDO: negative thyromegally  NEURO: alert and oiented appropriately, no gross deficits   PSYCH: euthymic mood with congruent affect   ED Treatments / Results  Labs (all labs ordered are listed, but only abnormal results are displayed) Labs Reviewed  BASIC METABOLIC PANEL - Abnormal; Notable for the following components:      Result Value   Chloride 96 (*)    Glucose, Bld 205 (*)    BUN 26 (*)    Creatinine, Ser 1.12 (*)    GFR calc non Af Amer 41 (*)    GFR calc Af Amer 48 (*)    All other components within normal limits  CBC - Abnormal; Notable for the following components:   Platelets 149 (*)    All  other components within normal limits  I-STAT TROPONIN, ED    EKG EKG Interpretation  Date/Time:  Friday February 12 2018 14:42:35 EDT Ventricular Rate:  80 PR Interval:    QRS Duration: 144 QT Interval:  436 QTC Calculation: 502 R Axis:   120 Text Interpretation:  Ventricular-paced rhythm Abnormal ECG Otherwise no significant change Confirmed by Deno Etienne [phone]) on 02/12/2018 5:01:06 PM   Radiology Dg Chest 2 View  Result Date: 02/12/2018 CLINICAL DATA:  Shortness of breath.  Cough. EXAM: CHEST - 2 VIEW COMPARISON:  Chest x-ray 02/09/2018. FINDINGS: Thoracic aortic prominence again noted consistent with known thoracic aortic aneurysm. No change from prior exam cardiac pacer with lead tip over the right ventricle. Prior CABG. Prior cardiac valve replacement. Bilateral mild interstitial prominence and bilateral pleural effusions. Findings suggest mild CHF. Mild basilar atelectasis again noted. IMPRESSION: 1. Cardiac pacer stable position. Cardiac valve replacement. Cardiomegaly with mild increase in bilateral interstitial prominence and small bilateral pleural effusions again noted. Findings consistent with CHF. Persistent mild bibasilar atelectasis. 2.  Thoracic aortic aneurysm again noted.  No interim change. Electronically Signed   By: Marcello Moores  Register   On: 02/12/2018 15:15    Procedures Procedures (including critical care time)  Medications Ordered in ED Medications - No data to display   Initial Impression / Assessment and Plan / ED Course  I have reviewed the triage vital signs and the nursing notes.  Pertinent labs & imaging results that were available during my care of the patient were  reviewed by me and considered in my medical decision making (see chart for details).  84 year old female with complex medical cardiac history presenting with dyspnea likely due to CHF exacerbation and new oxygen need.  On exam, patient with rales bilaterally, 1+ edema and JVD to her jaw.  CXR  with cardiomegaly with mild increase in bilateral interstitial prominence and small bilateral pleural effusion.  EKG with T wave inversion in lateral and inferior leads.  Troponin 0 0.06. Well's score 0. She reports good compliance with her warfarin. Will check PT/INR. Patient is allergic to Lasix. Will give a twice her home dose torsemide.  Check BNP.  Will continue cycling troponin.   Will page unassigned medicine for admission.  Discussed patient with [facility] who will admit patient.  Final Clinical Impressions(s) / ED Diagnoses   Final diagnoses:  None    ED Discharge Orders    None       Mercy Riding, MD 02/12/18 2008    Deno Etienne, DO 02/12/18 [id number]

## 2018-02-12 NOTE — Telephone Encounter (Signed)
I called pt's niece back and she states that even after pt does neb tx that her sats are in the 28s. I suggested ER or UC due to pt could have PNA or a COPD exacerbation. She states they want to know about CXR results and what the on call provider thinks they should do. Please advise.

## 2018-02-12 NOTE — H&P (Addendum)
TRH H&P   Patient Demographics:    [name], is a 84 y.o. female  MRN: [id number]   DOB - 1922/08/23  Admit Date - 02/12/2018  Outpatient Primary MD for the patient is Jodi Marble, MD  Referring MD/NP/PA:   Wendee Beavers  Outpatient Specialists:    Dr. Ashby Dawes Dr. Dossie Der   Patient coming from: home  Chief Complaint  Patient presents with  . Shortness of Breath      HPI:    [name]  is a 84 y.o. female, w PMR, CHF (EF 50-55%), moderate MR, Pafib, AAA, apparently c/o dyspnea worse today.  Pt denies fever, chills, cp, palp, orthopnea, pnd, weight gain, lower ext edema. Her pox 80's at home and therefore presented to ED for evaluation  In ED,  Na 137, K 4.4, Bun 26, Creatinine 1.12 Wbc 6.1, Hgb 13.3, Plt 149 Trop 0.06  EKG paced at 80.    CXR IMPRESSION: 1. Cardiac pacer stable position. Cardiac valve replacement. Cardiomegaly with mild increase in bilateral interstitial prominence and small bilateral pleural effusions again noted. Findings consistent with CHF. Persistent mild bibasilar atelectasis.  2.  Thoracic aortic aneurysm again noted.  No interim change.  Pt will be admitted for dyspnea, hypoxia, secondary to CHF.       Review of systems:    In addition to the HPI above,  No Fever-chills, No Headache, No changes with Vision or hearing, No problems swallowing food or Liquids, No Chest pain, No Cough No Abdominal pain, No Nausea or Vommitting, Bowel movements are regular, No Blood in stool or Urine, No dysuria, No new skin rashes or bruises, No new joints pains-aches,  No new weakness, tingling, numbness in any extremity, No recent weight gain or loss, No polyuria, polydypsia or polyphagia, No significant Mental Stressors.  A full 10 point Review of Systems was done, except as stated above, all other Review of Systems were  negative.   With Past History of the following :    Past Medical History:  Diagnosis Date  . AAA (abdominal aortic aneurysm) ([place])    a. 12/2011 U/S: 3.3 cm prox AAA.  [name] Anxiety   . Aortic insufficiency    a. S/P bioprosthetic AVR;  b. 02/2013 Echo: EF 50-55%, mild to mod AI (mean grad 50mmHg).  . Brain aneurysm    Hx of  . Chest pain    a. H/o nonobs cath;  b. 07/2013 Lexiscan MV: EF 66%, no ischemia.  . Chronic diastolic CHF (congestive heart failure) ([place])    a. 02/2013 Echo: EF 50-55%.  . Diverticulosis   . Gastritis   . Hemorrhoids   . Hepatitis B 1980's  . Hiatal hernia   . Hypertension   . Moderate mitral regurgitation    a. 02/2013 Echo: Mod MR.  . Moderate tricuspid regurgitation    a. 02/2013 Echo: Mod TR.  [name]  Permanent atrial fibrillation ([place])    a. Chronic coumadin.  . Polymyalgia rheumatica ([place])   . Pulmonary hypertension ([place])    a. 02/2013 Echo: PASP 62mmHg.  . S/P aortic valve replacement with bioprosthetic valve    a. [facility].  . S/P placement of cardiac pacemaker    a.  08/2007 s/p MDT [place] PPM.  . Seizures ([place])   . Shingles   . Skin cancer    face, ear  and left hand.  . Tachy-brady syndrome ([place])    a. Severe in the setting of permanent atrial fibrillation with tachybrady syndrome;  b. 08/2007 s/p MDT [place] PPM.  . Thoracic aneurysm    a. 10/2011 CTA: 5.0 x 3.8 cm.      Past Surgical History:  Procedure Laterality Date  . ABDOMINAL AORTIC ANEURYSM REPAIR     Resection  . AORTIC VALVE REPLACEMENT     With bioprosthetic valve  . APPENDECTOMY    . BREAST BIOPSY    . CEREBRAL ANEURYSM REPAIR    . CHOLECYSTECTOMY    . EP IMPLANTABLE DEVICE N/A 08/20/2016   Procedure: PPM Generator Changeout;  Surgeon: Deboraha Sprang, MD;  Location: [facility];  Service: Cardiovascular;  Laterality: N/A;  . LUNG BIOPSY  2005  . PACEMAKER PLACEMENT  08/2007   [employer]; AF permanent; s/p pacemaker for bradycardia with now well  controlled ventricular response; Digoxin therapy, question level; Lower extremity venous issues  . skin cancer removal     ear, face and hand  . VESICOVAGINAL FISTULA CLOSURE W/ TAH        Social History:     Social History   Tobacco Use  . Smoking status: Never Smoker  . Smokeless tobacco: Never Used  Substance Use Topics  . Alcohol use: No     Lives - at home  Mobility - walks by self   Family History :     Family History  Problem Relation Age of Onset  . Cancer Mother   . Heart disease Mother   . Hypertension Mother   . Varicose Veins Mother   . Heart disease Father   . Hypertension Father   . Heart disease Sister   . Hypertension Sister   . Varicose Veins Sister   . AAA (abdominal aortic aneurysm) Sister   . Heart disease Brother   . Hypertension Brother       Home Medications:   Prior to Admission medications   Medication Sig Start Date End Date Taking? Authorizing Provider  acetaminophen (TYLENOL) 500 MG tablet Take 1 tablet (500 mg total) by mouth every 6 (six) hours. Patient taking differently: Take 500 mg by mouth every 6 (six) hours as needed (for pain or headaches).  04/23/17  Yes Johnson, Clanford L, MD  albuterol (PROVENTIL HFA;VENTOLIN HFA) 108 (90 Base) MCG/ACT inhaler Inhale 1 puff into the lungs every 6 (six) hours as needed for wheezing or shortness of breath.   Yes [provider]  albuterol (PROVENTIL) (2.5 MG/3ML) 0.083% nebulizer solution Inhale 1 vial via nebulizer every 6 hours as needed .J44.9 Patient taking differently: Take 2.5 mg by nebulization 3 (three) times daily.  02/10/18  Yes Laverle Hobby, MD  AMBULATORY NON FORMULARY MEDICATION Medication Name: AeroChamber DME:[facility] AS:N05.39 12/18/17  Yes Laverle Hobby, MD  bacitracin ointment Apply 1 application topically 2 (two) times daily. Apply to skin tears, then place a non-stick dressing 01/14/18  Yes Duffy Bruce, MD  Brinzolamide-Brimonidine [facility]) 1-0.2 %  SUSP Place 1 drop into both eyes daily.    Yes [provider]  budesonide-formoterol (SYMBICORT) 160-4.5 MCG/ACT inhaler Inhale 1 puff into the lungs 2 (two) times daily.   Yes [provider]  diclofenac sodium (VOLTAREN) 1 % GEL Apply 4 g topically See admin instructions. Apply 4 grams to both knees two times a day   Yes [provider]  flintstones complete (FLINTSTONES) 60 MG chewable tablet Chew 1 tablet by mouth daily.   Yes [provider]  ipratropium (ATROVENT) 0.02 % nebulizer solution Take 2.5 mLs (0.5 mg total) by nebulization 4 (four) times daily. As needed J44.9 Patient taking differently: Take 0.5 mg by nebulization 3 (three) times daily.  02/10/18  Yes Laverle Hobby, MD  latanoprost (XALATAN) 0.005 % ophthalmic solution Place 1 drop into both eyes at bedtime.   Yes [provider]  LORazepam (ATIVAN) 0.5 MG tablet Take 1 tablet (0.5 mg total) by mouth every 8 (eight) hours as needed for anxiety. Patient taking differently: Take 0.5 mg by mouth 3 (three) times daily.  04/23/17  Yes Johnson, Clanford L, MD  meclizine (ANTIVERT) 25 MG tablet Take 12.5 mg by mouth 3 (three) times daily as needed for dizziness.   Yes [provider]  pantoprazole (PROTONIX) 40 MG tablet Take 40 mg by mouth daily.    Yes [provider]  PARoxetine (PAXIL) 10 MG tablet Take 10 mg by mouth daily. 01/26/18  Yes [provider]  polyethylene glycol (MIRALAX / GLYCOLAX) packet Take 17 g by mouth daily as needed for moderate constipation. Patient taking differently: Take 17 g by mouth daily.  04/23/17  Yes Johnson, Clanford L, MD  potassium chloride SA (K-DUR,KLOR-CON) 20 MEQ tablet TAKE ONE TABLET BY MOUTH EACH EVENING Patient taking differently: Take 20 mEq by mouth in the evening 12/16/16  Yes Gollan, Kathlene November, MD  predniSONE (DELTASONE) 5 MG tablet Take 7.5 mg by mouth daily. 02/01/18  Yes [provider]  torsemide (DEMADEX)  20 MG tablet Take 1.5 tablets (30 mg total) by mouth daily. Patient taking differently: Take 20 mg by mouth daily.  04/23/17  Yes Johnson, Clanford L, MD  traMADol (ULTRAM) 50 MG tablet Take 1 tablet (50 mg total) by mouth every 6 (six) hours as needed for moderate pain. Patient taking differently: Take 25 mg by mouth at bedtime.  04/23/17  Yes Johnson, Clanford L, MD  traZODone (DESYREL) 50 MG tablet Take 75 mg by mouth at bedtime.    Yes [provider]  warfarin (COUMADIN) 5 MG tablet TAKE AS DIRECTED BY THE COUMADIN CLINIC Patient taking differently: Take 2.5-5 mg by mouth See admin instructions. Take 2.5 mg by mouth in the evening at 6 PM on Sun/Mon/Wed/Thurs/Fri and 5 mg on Tues/Sat 12/23/17  Yes Gollan, Kathlene November, MD  oxyCODONE (ROXICODONE) 5 MG immediate release tablet Take 0.5 tablets (2.5 mg total) by mouth every 6 (six) hours as needed for severe pain. Patient not taking: Reported on 02/12/2018 01/14/18   Duffy Bruce, MD     Allergies:     Allergies  Allergen Reactions  . Advair Diskus [Fluticasone-Salmeterol] Anaphylaxis  . Furosemide Shortness Of Breath  . Other Shortness Of Breath and Other (See Comments)    IODINATED CONTRAST MEDIA: Chills and fever also  . Salmeterol Anaphylaxis    THROAT CLOSES with Advair  . Iohexol Other (See Comments)    Desc:  Pt requires a 13 hr prep, when doing exam with contrast....fever, chills, dyspnea (slg/04/27/09--given  2 hour solumedrol/ benadryl prep--no reaction).    . Sulfonamide Derivatives Nausea And Vomiting  . Verapamil Hives, Nausea And Vomiting and Other (See Comments)    Loss of appetite also   . Adhesive [Tape] Other (See Comments)    TEARS and BRUISES SKIN  . Amoxicillin-Pot Clavulanate Nausea Only and Other (See Comments)    NAUSEA AND STOMACH PAIN  . Contrast Media [Iodinated Diagnostic Agents] Other (See Comments)    "Froze" and fever  . Ibuprofen Other (See Comments)    Has liver damage  . Levofloxacin Nausea Only  and Other (See Comments)    NAUSEA AND STOMACH PAIN  . Macrobid [Nitrofurantoin Monohyd Macro] Nausea Only and Other (See Comments)    Severe stomach pain  . Latex Rash  . Penicillins Rash and Other (See Comments)    Has patient had a PCN reaction causing immediate rash, facial/tongue/throat swelling, SOB or lightheadedness with hypotension: Yes Has patient had a PCN reaction causing severe rash involving mucus membranes or skin necrosis: Unk Has patient had a PCN reaction that required hospitalization: Unk Has patient had a PCN reaction occurring within the last 10 years: No If all of the above answers are "NO", then may proceed with Cephalosporin use.      Physical Exam:   Vitals  Blood pressure 130/89, pulse 75, temperature 98 F (36.7 C), temperature source Oral, resp. rate (!) 27, height 5\' 3"  (1.6 m), weight 62.1 kg (137 lb), SpO2 94 %.   1. General  lying in bed in NAD,   2. Normal affect and insight, Not Suicidal or Homicidal, Awake Alert, Oriented X 3.  3. No F.N deficits, ALL C.Nerves Intact, Strength 5/5 all 4 extremities, Sensation intact all 4 extremities, Plantars down going.  4. Ears and Eyes appear Normal, Conjunctivae clear, PERRLA. Moist Oral Mucosa.  5. Supple Neck,+   JVD, No cervical lymphadenopathy appriciated, No Carotid Bruits.  6. Symmetrical Chest wall movement, crackles 1/2 up bilaterally, no wheezing  7. RRR, S1, S2, 2/6 sem apex  8. Positive Bowel Sounds, Abdomen Soft, No tenderness, No organomegaly appriciated,No rebound -guarding or rigidity.  9.  No Cyanosis, Normal Skin Turgor, No Skin Rash or Bruise.  10. Good muscle tone,  joints appear normal , no effusions, Normal ROM.  11. No Palpable Lymph Nodes in Neck or Axillae     Data Review:    CBC Recent Labs  Lab 02/12/18 1452  WBC 6.1  HGB 13.3  HCT 42.8  PLT 149*  MCV 94.9  MCH 29.5  MCHC 31.1  RDW 14.7    ------------------------------------------------------------------------------------------------------------------  Chemistries  Recent Labs  Lab 02/09/18 1610 02/12/18 1452  NA 136 137  K 3.6 4.4  CL 94* 96*  CO2 30 28  GLUCOSE 198* 205*  BUN 33* 26*  CREATININE 1.17* 1.12*  CALCIUM 8.6* 8.9   ------------------------------------------------------------------------------------------------------------------ estimated creatinine clearance is 26 mL/min (A) (by C-G formula based on SCr of 1.12 mg/dL (H)). ------------------------------------------------------------------------------------------------------------------ No results for input(s): TSH, T4TOTAL, T3FREE, THYROIDAB in the last 72 hours.  Invalid input(s): FREET3  Coagulation profile Recent Labs  Lab 02/08/18  INR 2.8   ------------------------------------------------------------------------------------------------------------------- No results for input(s): DDIMER in the last 72 hours. -------------------------------------------------------------------------------------------------------------------  Cardiac Enzymes No results for input(s): CKMB, TROPONINI, MYOGLOBIN in the last 168 hours.  Invalid input(s): CK ------------------------------------------------------------------------------------------------------------------    Component Value Date/Time   BNP 121.0 (H) 12/16/2017 1544     ---------------------------------------------------------------------------------------------------------------  Urinalysis    Component Value Date/Time   COLORURINE  YELLOW 01/14/2018 0321   APPEARANCEUR CLEAR 01/14/2018 0321   LABSPEC 1.012 01/14/2018 0321   PHURINE 5.0 01/14/2018 0321   GLUCOSEU 150 (A) 01/14/2018 0321   HGBUR NEGATIVE 01/14/2018 0321   BILIRUBINUR NEGATIVE 01/14/2018 0321   KETONESUR NEGATIVE 01/14/2018 0321   PROTEINUR NEGATIVE 01/14/2018 0321   UROBILINOGEN 1.0 08/01/2015 2030   NITRITE  POSITIVE (A) 01/14/2018 0321   LEUKOCYTESUR MODERATE (A) 01/14/2018 0321    ----------------------------------------------------------------------------------------------------------------   Imaging Results:    Dg Chest 2 View  Result Date: 02/12/2018 CLINICAL DATA:  Shortness of breath.  Cough. EXAM: CHEST - 2 VIEW COMPARISON:  Chest x-ray 02/09/2018. FINDINGS: Thoracic aortic prominence again noted consistent with known thoracic aortic aneurysm. No change from prior exam cardiac pacer with lead tip over the right ventricle. Prior CABG. Prior cardiac valve replacement. Bilateral mild interstitial prominence and bilateral pleural effusions. Findings suggest mild CHF. Mild basilar atelectasis again noted. IMPRESSION: 1. Cardiac pacer stable position. Cardiac valve replacement. Cardiomegaly with mild increase in bilateral interstitial prominence and small bilateral pleural effusions again noted. Findings consistent with CHF. Persistent mild bibasilar atelectasis. 2.  Thoracic aortic aneurysm again noted.  No interim change. Electronically Signed   By: Preston   On: 02/12/2018 15:15       Assessment & Plan:    Principal Problem:   Dyspnea Active Problems:   CHF (congestive heart failure) (HCC)   Hypertension    Dyspnea, Hypoxia secondary to CHF Tele Trop I q6h x3  Cardiac echo  Increase Torsemide to 60mg  po qday   Copd Cont Symbicort Cont Albuterol  PMR Cont prednisone  Pafib  Cont coumadin, pharmacy to dose  Anxiety Cont lorazepam Cont paxil  DVT Prophylaxis coumadin - SCDs  AM Labs Ordered, also please review Full Orders  Family Communication: Admission, patients condition and plan of care including tests being ordered have been discussed with the patient  who indicate understanding and agree with the plan and Code Status.  Code Status FULL CODE  Likely DC to  home  Condition GUARDED    Consults called:  Cardiology by email  Admission status:  inpatient   Time spent in minutes : 45   Jani Gravel M.D on 02/12/2018 at 7:17 PM  Between 7am to 7pm - Pager - [phone]  . After 7pm go to [email or web address] - password [facility]  [facility] - Office  [phone]

## 2018-02-12 NOTE — Telephone Encounter (Signed)
Called and spoke with [name], she states that patients O2 level is still not going much above 90 even with the nebulizer treatments. Patient woke up in the middle of the night and couldn't breath, her O2 level was 83. After taking a nebulizer treatment it got back up to 88.   Patient is not on oxygen.   Took this message by mistake, routing to [place].

## 2018-02-12 NOTE — Telephone Encounter (Signed)
Spoke with niece and informed her of the response and she states that DR told them that the CXR was no different than before. States they will go from there. Nothing further needed.

## 2018-02-12 NOTE — Telephone Encounter (Signed)
Patient niece calling to let us know sats in 80's even nebulizers   Please call to advise

## 2018-02-12 NOTE — Telephone Encounter (Signed)
Patient has fluid on CXR Patients needs ER or UC assessment

## 2018-02-12 NOTE — ED Triage Notes (Signed)
Pt in from home c/o SOB, pt reports ongoing SOB over the last 2 wks,, with O2 sats in the 80s at home, pt denies CP, c/o nausea, denies v/d, pt A&O x4

## 2018-02-12 NOTE — Progress Notes (Signed)
ANTICOAGULATION CONSULT NOTE - Initial Consult  Pharmacy Consult for coumadin Indication: atrial fibrillation  Allergies  Allergen Reactions  . Advair Diskus [Fluticasone-Salmeterol] Anaphylaxis  . Furosemide Shortness Of Breath  . Other Shortness Of Breath and Other (See Comments)    IODINATED CONTRAST MEDIA: Chills and fever also  . Salmeterol Anaphylaxis    THROAT CLOSES with Advair  . Iohexol Other (See Comments)    Desc:  Pt requires a 13 hr prep, when doing exam with contrast....fever, chills, dyspnea (slg/04/27/09--given 2 hour solumedrol/ benadryl prep--no reaction).    . Sulfonamide Derivatives Nausea And Vomiting  . Verapamil Hives, Nausea And Vomiting and Other (See Comments)    Loss of appetite also   . Adhesive [Tape] Other (See Comments)    TEARS and BRUISES SKIN  . Amoxicillin-Pot Clavulanate Nausea Only and Other (See Comments)    NAUSEA AND STOMACH PAIN  . Contrast Media [Iodinated Diagnostic Agents] Other (See Comments)    "Froze" and fever  . Ibuprofen Other (See Comments)    Has liver damage  . Levofloxacin Nausea Only and Other (See Comments)    NAUSEA AND STOMACH PAIN  . Macrobid [Nitrofurantoin Monohyd Macro] Nausea Only and Other (See Comments)    Severe stomach pain  . Latex Rash  . Penicillins Rash and Other (See Comments)    Has patient had a PCN reaction causing immediate rash, facial/tongue/throat swelling, SOB or lightheadedness with hypotension: Yes Has patient had a PCN reaction causing severe rash involving mucus membranes or skin necrosis: Unk Has patient had a PCN reaction that required hospitalization: Unk Has patient had a PCN reaction occurring within the last 10 years: No If all of the above answers are "NO", then may proceed with Cephalosporin use.     Patient Measurements: Height: 5\' 3"  (160 cm) Weight: 138 lb 14.4 oz (63 kg) IBW/kg (Calculated) : 52.4   Vital Signs: Temp: 97.5 F (36.4 C) (03/29 2038) Temp Source: Oral (03/29  2038) BP: 155/97 (03/29 2038) Pulse Rate: 85 (03/29 2038)  Labs: Recent Labs    02/12/18 1452 02/12/18 1849  HGB 13.3  --   HCT 42.8  --   PLT 149*  --   LABPROT 31.0*  --   INR 3.01  --   CREATININE 1.12*  --   TROPONINI  --  0.06*    Estimated Creatinine Clearance: 28 mL/min (A) (by C-G formula based on SCr of 1.12 mg/dL (H)).   Medical History: Past Medical History:  Diagnosis Date  . AAA (abdominal aortic aneurysm) ([place])    a. 12/2011 U/S: 3.3 cm prox AAA.  [name] Anxiety   . Aortic insufficiency    a. S/P bioprosthetic AVR;  b. 02/2013 Echo: EF 50-55%, mild to mod AI (mean grad 60mmHg).  . Brain aneurysm    Hx of  . Chest pain    a. H/o nonobs cath;  b. 07/2013 Lexiscan MV: EF 66%, no ischemia.  . Chronic diastolic CHF (congestive heart failure) ([place])    a. 02/2013 Echo: EF 50-55%.  . Diverticulosis   . Gastritis   . Hemorrhoids   . Hepatitis B 1980's  . Hiatal hernia   . Hypertension   . Moderate mitral regurgitation    a. 02/2013 Echo: Mod MR.  . Moderate tricuspid regurgitation    a. 02/2013 Echo: Mod TR.  [name] Permanent atrial fibrillation ([place])    a. Chronic coumadin.  . Polymyalgia rheumatica ([place])   . Pulmonary hypertension ([place])    a. 02/2013  Echo: PASP 45mmHg.  . S/P aortic valve replacement with bioprosthetic valve    a. [facility].  . S/P placement of cardiac pacemaker    a.  08/2007 s/p MDT [place] PPM.  . Seizures ([place])   . Shingles   . Skin cancer    face, ear  and left hand.  . Tachy-brady syndrome ([place])    a. Severe in the setting of permanent atrial fibrillation with tachybrady syndrome;  b. 08/2007 s/p MDT [place] PPM.  . Thoracic aneurysm    a. 10/2011 CTA: 5.0 x 3.8 cm.    Assessment: 84 year old female with history of afib presents to [facility] with shortness of breath.   INR 3.0 on admit. Coumadin dose prior to admit was 5mg  on Tues/Sat and 2.5mg  other days. Dose taken yesterday. Given INR at upper end of goal, will hold  tonight and follow up INR in am.   CBC within normal limits and no bleeding issues noted.  Goal of Therapy:  INR 2-3 Monitor platelets by anticoagulation protocol: Yes   Plan:  Hold warfarin tonight Daily INR for now   Erin Hearing PharmD., BCPS Clinical Pharmacist 02/12/2018 8:58 PM

## 2018-02-13 ENCOUNTER — Other Ambulatory Visit: Payer: Self-pay

## 2018-02-13 ENCOUNTER — Inpatient Hospital Stay (HOSPITAL_COMMUNITY): Payer: Medicare Other

## 2018-02-13 DIAGNOSIS — I340 Nonrheumatic mitral (valve) insufficiency: Secondary | ICD-10-CM

## 2018-02-13 DIAGNOSIS — I361 Nonrheumatic tricuspid (valve) insufficiency: Secondary | ICD-10-CM

## 2018-02-13 LAB — GLUCOSE, CAPILLARY
GLUCOSE-CAPILLARY: 172 mg/dL — AB (ref 65–99)
GLUCOSE-CAPILLARY: 168 mg/dL — AB (ref 65–99)
Glucose-Capillary: 98 mg/dL (ref 65–99)
Glucose-Capillary: 101 mg/dL — ABNORMAL HIGH (ref 65–99)

## 2018-02-13 LAB — CBC
HCT: 42.7 % (ref 36.0–46.0)
HEMOGLOBIN: 12.7 g/dL (ref 12.0–15.0)
MCH: 28.2 pg (ref 26.0–34.0)
MCHC: 29.7 g/dL — ABNORMAL LOW (ref 30.0–36.0)
MCV: 94.9 fL (ref 78.0–100.0)
Platelets: 137 10*3/uL — ABNORMAL LOW (ref 150–400)
RBC: 4.5 MIL/uL (ref 3.87–5.11)
RDW: 14.4 % (ref 11.5–15.5)
WBC: 4.2 10*3/uL (ref 4.0–10.5)

## 2018-02-13 LAB — COMPREHENSIVE METABOLIC PANEL
ALT: 16 U/L (ref 14–54)
ANION GAP: 14 (ref 5–15)
AST: 24 U/L (ref 15–41)
Albumin: 3.5 g/dL (ref 3.5–5.0)
Alkaline Phosphatase: 67 U/L (ref 38–126)
BUN: 17 mg/dL (ref 6–20)
CHLORIDE: 96 mmol/L — AB (ref 101–111)
CO2: 32 mmol/L (ref 22–32)
CREATININE: 0.99 mg/dL (ref 0.44–1.00)
Calcium: 8.4 mg/dL — ABNORMAL LOW (ref 8.9–10.3)
GFR, EST AFRICAN AMERICAN: 55 mL/min — AB (ref >60)
GFR, EST NON AFRICAN AMERICAN: 48 mL/min — AB (ref >60)
Glucose, Bld: 99 mg/dL (ref 65–99)
POTASSIUM: 2.9 mmol/L — AB (ref 3.5–5.1)
SODIUM: 142 mmol/L (ref 135–145)
Total Bilirubin: 1.8 mg/dL — ABNORMAL HIGH (ref 0.3–1.2)
Total Protein: 5.8 g/dL — ABNORMAL LOW (ref 6.5–8.1)

## 2018-02-13 LAB — PROTIME-INR
INR: 2.96
PROTHROMBIN TIME: 30.6 s — AB (ref 11.4–15.2)

## 2018-02-13 LAB — TROPONIN I
TROPONIN I: 0.06 ng/mL — AB (ref <0.03)
TROPONIN I: 0.06 ng/mL — AB (ref <0.03)

## 2018-02-13 LAB — URINALYSIS, ROUTINE W REFLEX MICROSCOPIC
Bilirubin Urine: NEGATIVE
Glucose, UA: NEGATIVE mg/dL
Hgb urine dipstick: NEGATIVE
KETONES UR: NEGATIVE mg/dL
LEUKOCYTES UA: NEGATIVE
Nitrite: NEGATIVE
PH: 8 (ref 5.0–8.0)
PROTEIN: NEGATIVE mg/dL
Specific Gravity, Urine: 1.006 (ref 1.005–1.030)

## 2018-02-13 MED ORDER — INSULIN ASPART 100 UNIT/ML ~~LOC~~ SOLN
0.0000 [IU] | Freq: Three times a day (TID) | SUBCUTANEOUS | Status: DC
  Administered 2018-02-13: 2 [IU] via SUBCUTANEOUS
  Administered 2018-02-14: 3 [IU] via SUBCUTANEOUS
  Administered 2018-02-15: 5 [IU] via SUBCUTANEOUS

## 2018-02-13 MED ORDER — WARFARIN SODIUM 2.5 MG PO TABS
2.5000 mg | ORAL_TABLET | Freq: Once | ORAL | Status: AC
  Administered 2018-02-13: 2.5 mg via ORAL
  Filled 2018-02-13: qty 1

## 2018-02-13 NOTE — Progress Notes (Deleted)
Cardiology Office Note  Date:  02/13/2018   ID:  [name], DOB 09/27/1922, MRN [id number]  PCP:  Jodi Marble, MD   No chief complaint on file.   HPI:  84 yo WF with history of  tachy-brady syndrome s/p pacemaker,  Permanent atrial fibrillation on warfarin,  HTN,  bioprosthetic AVR seen on echo June 2017,  AAA with repair 4-5 years ago,  chronic diastolic heart failure,   right brachial arterial stenosis,  TIA in 2012,   No significant coronary artery disease or carotid arterial disease Echocardiogram in 2012 showing moderate MR and TR with moderate pulmonary hypertension  significant peripheral vascular disease, Thoracic aortic aneurysm followed by Dr. Donnetta Hutching.  Treated with chronic prednisone for polymyalgia Chronic nausea presenting for routine followup of her chronic diastolic CHF, aneurysm and atrial fibrillation  No recent hospitalizations Recent medical issues managed by primary care Reports having chest x-ray with small pleural effusion Took extra torsemide Was having mild shortness of breath Takes torsemide 20 mg daily, occasionally takes 1.5 pills for extra leg swelling or shortness of breath  Difficulty with urinary tract infections, completed course of antibiotics, repeat culture done through primary care, results not available  Denies having any tachycardia or palpitations   Previous CT scan and records reviewed from Dr. Donnetta Hutching Notes indicating to not pursue aggressive course with her aortic aneurysms Nonsurgical candidate  Previously in the hospital June 2018 for hemoptysis, shortness of breath CT scan showing multifocal pneumonia, possible aspiration, started on antibiotics, BIPAP Possible necrosis  Tolerating warfarin  EKG personally reviewed by myself on todays visit Shows atrial fibrillation rate 76 bpm, ventricularly paced rhythm  S/P AAA repair/distal aortic arch aneurysm Continue blood pressure control. Distal aortic arch aneurysm 5.6  cm. The mid descending thoracic aorta measures 4.9 cm and is stable. Distal descending thoracic aorta measures 4.3 cm and is stable.   Other past medical history reviewed CT 05/2016 Thoracic aortic aneurysm, measuring 5.3 cm at the level of the proximal descending thoracic aorta, previously 4.5 cm  Echocardiogram April 2016 with significant pulmonary hypertension.  pneumonia in October 2015.   chronic knee pain Some shortness of breath but is not very active at baseline  History of severe COPD exacerbation, bronchitis. She was on Z-Pak, prednisone, started on albuterol inhaler She took several days of diuretics, torsemide,  with mild weight loss now down to 140 pounds. No significant edema. Symptoms improved  Previous Evaluation of her pacemaker report shows she is 60% RV paced  admitted to the hospital at [facility] on Apr 16 2011 with shortness of breath, acute on chronic diastolic heart failure, underlying hypertension.  possible  pneumonia. White blood cell count was 13. Antibiotics were discontinued after her chest x-ray cleared.  Numerous skin cancers over the past several years, on her legs, face, arms requiring resection History of arthritis in her knees requiring cortisone shot  cardiac catheterization over 5 years ago showing no significant coronary artery disease, recent CT scan showing stable descending aorta and after repair, minimal dilatation of the descending aorta, stable-appearing aortic valve.   PMH:   has a past medical history of AAA (abdominal aortic aneurysm) ([place]), Anxiety, Aortic insufficiency, Brain aneurysm, Chest pain, Chronic diastolic CHF (congestive heart failure) ([place]), Diverticulosis, Gastritis, Hemorrhoids, Hepatitis B (1980's), Hiatal hernia, Hypertension, Moderate mitral regurgitation, Moderate tricuspid regurgitation, Permanent atrial fibrillation ([place]), Polymyalgia rheumatica ([place]), Pulmonary hypertension ([place]), S/P aortic valve replacement with  bioprosthetic valve, S/P placement of cardiac pacemaker, Seizures ([place]), Shingles, Skin cancer,  Tachy-brady syndrome ([place]), and Thoracic aneurysm.  PSH:    Past Surgical History:  Procedure Laterality Date  . ABDOMINAL AORTIC ANEURYSM REPAIR     Resection  . AORTIC VALVE REPLACEMENT     With bioprosthetic valve  . APPENDECTOMY    . BREAST BIOPSY    . CEREBRAL ANEURYSM REPAIR    . CHOLECYSTECTOMY    . EP IMPLANTABLE DEVICE N/A 08/20/2016   Procedure: PPM Generator Changeout;  Surgeon: Deboraha Sprang, MD;  Location: [facility];  Service: Cardiovascular;  Laterality: N/A;  . LUNG BIOPSY  2005  . PACEMAKER PLACEMENT  08/2007   [employer]; AF permanent; s/p pacemaker for bradycardia with now well controlled ventricular response; Digoxin therapy, question level; Lower extremity venous issues  . skin cancer removal     ear, face and hand  . VESICOVAGINAL FISTULA CLOSURE W/ TAH      No current facility-administered medications for this visit.    No current outpatient medications on file.   Facility-Administered Medications Ordered in Other Visits  Medication Dose Route Frequency Provider Last Rate Last Dose  . 0.9 %  sodium chloride infusion  250 mL Intravenous PRN Jani Gravel, MD      . acetaminophen (TYLENOL) tablet 650 mg  650 mg Oral Q6H PRN Jani Gravel, MD       Or  . acetaminophen (TYLENOL) suppository 650 mg  650 mg Rectal Q6H PRN Jani Gravel, MD      . albuterol (PROVENTIL) (2.5 MG/3ML) 0.083% nebulizer solution 2.5 mg  2.5 mg Nebulization Q6H PRN Jani Gravel, MD      . insulin aspart (novoLOG) injection 0-9 Units  0-9 Units Subcutaneous TID WC Osei-Bonsu, George, MD      . ipratropium (ATROVENT) nebulizer solution 0.5 mg  0.5 mg Nebulization TID Jani Gravel, MD   0.5 mg at 02/13/18 0858  . latanoprost (XALATAN) 0.005 % ophthalmic solution 1 drop  1 drop Both Eyes QHS Jani Gravel, MD   1 drop at 02/12/18 2238  . LORazepam (ATIVAN) tablet 0.5 mg  0.5 mg Oral TID Jani Gravel, MD    0.5 mg at 02/13/18 1002  . meclizine (ANTIVERT) tablet 12.5 mg  12.5 mg Oral TID PRN Jani Gravel, MD      . mometasone-formoterol [facility]) 200-5 MCG/ACT inhaler 2 puff  2 puff Inhalation BID Jani Gravel, MD   2 puff at 02/13/18 [phone]  . pantoprazole (PROTONIX) EC tablet 40 mg  40 mg Oral Daily Jani Gravel, MD   40 mg at 02/13/18 1002  . PARoxetine (PAXIL) tablet 10 mg  10 mg Oral Daily Jani Gravel, MD   10 mg at 02/13/18 1002  . pneumococcal 23 valent vaccine (PNU-IMMUNE) injection 0.5 mL  0.5 mL Intramuscular Tomorrow-1000 Jani Gravel, MD      . polyethylene glycol (MIRALAX / GLYCOLAX) packet 17 g  17 g Oral Daily Jani Gravel, MD   17 g at 02/13/18 1000  . potassium chloride SA (K-DUR,KLOR-CON) CR tablet 20 mEq  20 mEq Oral Daily Jani Gravel, MD   20 mEq at 02/13/18 1002  . predniSONE (DELTASONE) tablet 7.5 mg  7.5 mg Oral Daily Jani Gravel, MD   7.5 mg at 02/13/18 1002  . sodium chloride flush (NS) 0.9 % injection 3 mL  3 mL Intravenous Q12H Jani Gravel, MD   3 mL at 02/12/18 2250  . sodium chloride flush (NS) 0.9 % injection 3 mL  3 mL Intravenous PRN Jani Gravel, MD      .  torsemide (DEMADEX) tablet 60 mg  60 mg Oral Daily Jani Gravel, MD   60 mg at 02/13/18 1000  . traMADol (ULTRAM) tablet 25 mg  25 mg Oral Loma Sousa, MD   25 mg at 02/12/18 2238  . traZODone (DESYREL) tablet 75 mg  75 mg Oral Loma Sousa, MD   75 mg at 02/12/18 2238  . Warfarin - Pharmacist Dosing Inpatient   Does not apply q1800 Lyndee Leo, [place] at 02/12/18 2101     Allergies:   Advair diskus [fluticasone-salmeterol]; Furosemide; Other; Salmeterol; Iohexol; Sulfonamide derivatives; Verapamil; Adhesive [tape]; Amoxicillin-pot clavulanate; Contrast media [iodinated diagnostic agents]; Ibuprofen; Levofloxacin; Macrobid [nitrofurantoin monohyd macro]; Latex; and Penicillins   Social History:  The patient  reports that she has never smoked. She has never used smokeless tobacco. She reports that she does not drink  alcohol or use drugs.   Family History:   family history includes AAA (abdominal aortic aneurysm) in her sister; Cancer in her mother; Heart disease in her brother, father, mother, and sister; Hypertension in her brother, father, mother, and sister; Varicose Veins in her mother and sister.    Review of Systems: Review of Systems  Respiratory: Negative.   Cardiovascular: Negative.   Gastrointestinal: Negative.   Musculoskeletal: Negative.   Neurological: Positive for weakness.  Psychiatric/Behavioral: Negative.   All other systems reviewed and are negative.    PHYSICAL EXAM: VS:  There were no vitals taken for this visit. , BMI There is no height or weight on file to calculate BMI. GEN: Well nourished, well developed, in no acute distress, presenting a wheelchair  HEENT: normal  Neck: no JVD, carotid bruits, or masses Cardiac: RRR; no murmurs, rubs, or gallops,no edema  Respiratory:  clear to auscultation bilaterally, normal work of breathing GI: soft, nontender, nondistended, + BS MS: no deformity or atrophy  Skin: warm and dry, no rash Neuro:  Strength and sensation are intact Psych: euthymic mood, full affect    Recent Labs: 02/12/2018: B Natriuretic Peptide 137.6 02/13/2018: ALT 16; BUN 17; Creatinine, Ser 0.99; Hemoglobin 12.7; Platelets 137; Potassium 2.9; Sodium 142      Wt Readings from Last 3 Encounters:  02/13/18 135 lb 3.2 oz (61.3 kg)  02/09/18 137 lb (62.1 kg)  01/13/18 133 lb (60.3 kg)       ASSESSMENT AND PLAN:  Atrial fibrillation, unspecified type ([place]) - Plan: EKG 12-Lead Ventricular rate well controlled, tolerating anticoagulation No recent falls  Thoracic aortic aneurysm without rupture [facility]) Not a surgical candidate No further imaging at this time  Leg swelling Minimal swelling, recommended compression hose for swelling, leg elevation  DIASTOLIC HEART FAILURE, CHRONIC continue torsemide 20 up to 30 mg daily Appears relatively euvolemic  on today's visit Previous chest x-ray with small pleural effusion, patient reports repeat chest x-ray with improvement  S/P AAA repair  seen on recent CT scan Not a candidate for aortic aneurysm surgery  Nausea Previously chronic issue Managed by primary care  AVR, bioprosthetic echocardiogram June 2017 Functioning well, well-seated  Weakness  Continues to be weak, stable, presenting in a wheelchair   Total encounter time more than 25 minutes  Greater than 50% was spent in counseling and coordination of care with the patient    Disposition:   F/U  6 months   No orders of the defined types were placed in this encounter.    Signed, Esmond Plants, M.D., Ph.D. 02/13/2018  [place] [phone]\

## 2018-02-13 NOTE — Progress Notes (Signed)
  Echocardiogram 2D Echocardiogram has been performed.  [name] 02/13/2018, 5:54 PM

## 2018-02-13 NOTE — Progress Notes (Signed)
[facility]                                                                              Patient Demographics  [name], is a 84 y.o. female, DOB - 1922-09-11, WHQ:[id number]  Admit date - 02/12/2018   Admitting Physician Jani Gravel, MD  Outpatient Primary MD for the patient is Jodi Marble, MD  Outpatient specialists:   LOS - 1  days    Chief Complaint  Patient presents with  . Shortness of Breath       Brief summary  [name]  is a 84 y.o. female with medical history significant for but not limited to diastolic CHF (EF 65-99%), COPD and Pafib presenting with progressively worsening shortness of breath and hypoxia with pulse oximetry in the 80s at home.  Chest x-ray showed mild pulmonary edema with bilateral small pleural effusions consistent with CHF, negative twelve-lead EKG for acute ST-T wave changes.  Patient admitted for CHF      Assessment & Plan    Principal Problem:   Dyspnea Active Problems:   CHF (congestive heart failure) (HCC)   Hypertension  #1 acute decompensation of diastolic CHF: Continue telemetry monitoring 2D echocardiogram pending Serial troponins mildly elevated (in the setting of acute kidney injury) Continue diuretics and monitor electrolytes and renal function Cardiology consulted by admitting physician  #2 paroxysmal atrial fibrillation: Stable Continue Coumadin  #3 COPD: Continue bronchodilators  #4 acute kidney injury: Mild Creatinine was 0.92 on 01/14/2018, admitted with creatinine of 1.12 Likely due to hemodynamic changes associated with acute CHF Monitor renal function with electrolytes  #5 hyperglycemia: Check A1c Sliding-scale insulin as needed   Code Status: Full code DVT Prophylaxis: Coumadin Family Communication: Discussed in detail with the patient, all imaging results, lab results explained to the patient or *   Disposition Plan: Home Time Spent in minutes   35  minutes  Procedures:    Consultants:   Cardiology  Antimicrobials:      Medications  Scheduled Meds: . ipratropium  0.5 mg Nebulization TID  . latanoprost  1 drop Both Eyes QHS  . LORazepam  0.5 mg Oral TID  . mometasone-formoterol  2 puff Inhalation BID  . pantoprazole  40 mg Oral Daily  . PARoxetine  10 mg Oral Daily  . pneumococcal 23 valent vaccine  0.5 mL Intramuscular Tomorrow-1000  . polyethylene glycol  17 g Oral Daily  . potassium chloride SA  20 mEq Oral Daily  . predniSONE  7.5 mg Oral Daily  . sodium chloride flush  3 mL Intravenous Q12H  . torsemide  60 mg Oral Daily  . traMADol  25 mg Oral QHS  . traZODone  75 mg Oral QHS  . Warfarin - Pharmacist Dosing Inpatient   Does not apply q1800   Continuous Infusions: . sodium chloride     PRN Meds:.sodium chloride, acetaminophen **OR** acetaminophen, albuterol, meclizine, sodium chloride flush   Antibiotics   Anti-infectives (From admission, onward)   None        Subjective:   [name] was seen and examined today.   Patient denies dizziness,her shortness of breath is  improving   Objective:   Vitals:   02/12/18 2038 02/12/18 2356 02/13/18 0559 02/13/18 0643  BP: (!) 155/97 (!) 141/43 (!) 145/64   Pulse: 85 64 62   Resp: 20 20 20    Temp: (!) 97.5 F (36.4 C) 97.7 F (36.5 C) (!) 96.8 F (36 C) 97.8 F (36.6 C)  TempSrc: Oral Oral Oral Oral  SpO2: 99% 100% 98%   Weight: 63 kg (138 lb 14.4 oz)  61.3 kg (135 lb 3.2 oz)   Height: 5\' 3"  (1.6 m)       Intake/Output Summary (Last 24 hours) at 02/13/2018 0650 Last data filed at 02/13/2018 0606 Gross per 24 hour  Intake 120 ml  Output 800 ml  Net -680 ml     Wt Readings from Last 3 Encounters:  02/13/18 61.3 kg (135 lb 3.2 oz)  02/09/18 62.1 kg (137 lb)  01/13/18 60.3 kg (133 lb)     Exam  General: NAD  HEENT: NCAT,  PERRL,MMM  Neck: SUPPLE, (-) JVD  Cardiovascular: RRR, (-) GALLOP, (-) MURMUR  Respiratory: Bilateral  basilar crackles  Gastrointestinal: SOFT, (-) DISTENSION, BS(+), (_) TENDERNESS  Ext: (-) CYANOSIS, (-) EDEMA  Neuro: A, OX 3              Psych:NORMAL AFFECT/MOOD   Data Reviewed:  I have personally reviewed following labs and imaging studies  Micro Results No results found for this or any previous visit (from the past 240 hour(s)).  Radiology Reports Dg Chest 2 View  Result Date: 02/12/2018 CLINICAL DATA:  Shortness of breath.  Cough. EXAM: CHEST - 2 VIEW COMPARISON:  Chest x-ray 02/09/2018. FINDINGS: Thoracic aortic prominence again noted consistent with known thoracic aortic aneurysm. No change from prior exam cardiac pacer with lead tip over the right ventricle. Prior CABG. Prior cardiac valve replacement. Bilateral mild interstitial prominence and bilateral pleural effusions. Findings suggest mild CHF. Mild basilar atelectasis again noted. IMPRESSION: 1. Cardiac pacer stable position. Cardiac valve replacement. Cardiomegaly with mild increase in bilateral interstitial prominence and small bilateral pleural effusions again noted. Findings consistent with CHF. Persistent mild bibasilar atelectasis. 2.  Thoracic aortic aneurysm again noted.  No interim change. Electronically Signed   By: Marcello Moores  Register   On: 02/12/2018 15:15   Dg Chest 2 View  Result Date: 02/10/2018 CLINICAL DATA:  Shortness of breath, chest pain. EXAM: CHEST - 2 VIEW COMPARISON:  Radiograph of Apr 15, 2017. FINDINGS: Stable cardiomegaly. Aneurysmal dilatation of descending thoracic aorta is noted. Atherosclerosis of abdominal aorta. Stable position of left-sided pacemaker. Status post aortic valve replacement. No pneumothorax is noted. Mild bibasilar subsegmental atelectasis is noted. Mild right pleural effusion is noted. IMPRESSION: Mild bibasilar subsegmental atelectasis. Mild right pleural effusion. Thoracic aortic aneurysm is noted. CTA of the chest is recommended for further evaluation. Aortic Atherosclerosis  (ICD10-I70.0). Electronically Signed   By: Marijo Conception, M.D.   On: 02/10/2018 08:48    Lab Data:  CBC: Recent Labs  Lab 02/12/18 1452  WBC 6.1  HGB 13.3  HCT 42.8  MCV 94.9  PLT 761*   Basic Metabolic Panel: Recent Labs  Lab 02/09/18 1610 02/12/18 1452  NA 136 137  K 3.6 4.4  CL 94* 96*  CO2 30 28  GLUCOSE 198* 205*  BUN 33* 26*  CREATININE 1.17* 1.12*  CALCIUM 8.6* 8.9   GFR: Estimated Creatinine Clearance: 26 mL/min (A) (by C-G formula based on SCr of 1.12 mg/dL (H)). Liver Function Tests: No results for input(s):  AST, ALT, ALKPHOS, BILITOT, PROT, ALBUMIN in the last 168 hours. No results for input(s): LIPASE, AMYLASE in the last 168 hours. No results for input(s): AMMONIA in the last 168 hours. Coagulation Profile: Recent Labs  Lab 02/08/18 02/12/18 1452  INR 2.8 3.01   Cardiac Enzymes: Recent Labs  Lab 02/12/18 1849 02/12/18 2031 02/13/18 0228  TROPONINI 0.06* 0.07* 0.06*   BNP (last 3 results) No results for input(s): PROBNP in the last 8760 hours. HbA1C: No results for input(s): HGBA1C in the last 72 hours. CBG: No results for input(s): GLUCAP in the last 168 hours. Lipid Profile: No results for input(s): CHOL, HDL, LDLCALC, TRIG, CHOLHDL, LDLDIRECT in the last 72 hours. Thyroid Function Tests: No results for input(s): TSH, T4TOTAL, FREET4, T3FREE, THYROIDAB in the last 72 hours. Anemia Panel: No results for input(s): VITAMINB12, FOLATE, FERRITIN, TIBC, IRON, RETICCTPCT in the last 72 hours. Urine analysis:    Component Value Date/Time   COLORURINE YELLOW 02/13/2018 0252   APPEARANCEUR HAZY (A) 02/13/2018 0252   LABSPEC 1.006 02/13/2018 0252   PHURINE 8.0 02/13/2018 0252   GLUCOSEU NEGATIVE 02/13/2018 0252   HGBUR NEGATIVE 02/13/2018 0252   BILIRUBINUR NEGATIVE 02/13/2018 0252   KETONESUR NEGATIVE 02/13/2018 0252   PROTEINUR NEGATIVE 02/13/2018 0252   UROBILINOGEN 1.0 08/01/2015 2030   NITRITE NEGATIVE 02/13/2018 0252   LEUKOCYTESUR  NEGATIVE 02/13/2018 0252     Benito Mccreedy M.D. [facility] 02/13/2018, 6:50 AM  Pager: [phone] Between 7am to 7pm - call Pager - [phone]  After 7pm go to [email or web address] - password [email or web address]  Call night coverage person covering after 7pm

## 2018-02-13 NOTE — Progress Notes (Signed)
[facility] for coumadin Indication: atrial fibrillation  Allergies  Allergen Reactions  . Advair Diskus [Fluticasone-Salmeterol] Anaphylaxis  . Furosemide Shortness Of Breath  . Other Shortness Of Breath and Other (See Comments)    IODINATED CONTRAST MEDIA: Chills and fever also  . Salmeterol Anaphylaxis    THROAT CLOSES with Advair  . Iohexol Other (See Comments)    Desc:  Pt requires a 13 hr prep, when doing exam with contrast....fever, chills, dyspnea (slg/04/27/09--given 2 hour solumedrol/ benadryl prep--no reaction).    . Sulfonamide Derivatives Nausea And Vomiting  . Verapamil Hives, Nausea And Vomiting and Other (See Comments)    Loss of appetite also   . Adhesive [Tape] Other (See Comments)    TEARS and BRUISES SKIN  . Amoxicillin-Pot Clavulanate Nausea Only and Other (See Comments)    NAUSEA AND STOMACH PAIN  . Contrast Media [Iodinated Diagnostic Agents] Other (See Comments)    "Froze" and fever  . Ibuprofen Other (See Comments)    Has liver damage  . Levofloxacin Nausea Only and Other (See Comments)    NAUSEA AND STOMACH PAIN  . Macrobid [Nitrofurantoin Monohyd Macro] Nausea Only and Other (See Comments)    Severe stomach pain  . Latex Rash  . Penicillins Rash and Other (See Comments)    Has patient had a PCN reaction causing immediate rash, facial/tongue/throat swelling, SOB or lightheadedness with hypotension: Yes Has patient had a PCN reaction causing severe rash involving mucus membranes or skin necrosis: Unk Has patient had a PCN reaction that required hospitalization: Unk Has patient had a PCN reaction occurring within the last 10 years: No If all of the above answers are "NO", then may proceed with Cephalosporin use.     Patient Measurements: Height: 5\' 3"  (160 cm) Weight: 135 lb 3.2 oz (61.3 kg) IBW/kg (Calculated) : 52.4   Vital Signs: Temp: 97.8 F (36.6 C) (03/30 0643) Temp Source: Oral (03/30 0643) BP: 145/64  (03/30 0559) Pulse Rate: 62 (03/30 0559)  Labs: Recent Labs    02/12/18 1452  02/12/18 2031 02/13/18 0228 02/13/18 0812  HGB 13.3  --   --   --  12.7  HCT 42.8  --   --   --  42.7  PLT 149*  --   --   --  137*  LABPROT 31.0*  --   --   --   --   INR 3.01  --   --   --   --   CREATININE 1.12*  --   --   --  0.99  TROPONINI  --    < > 0.07* 0.06* 0.06*   < > = values in this interval not displayed.    Estimated Creatinine Clearance: 29.4 mL/min (by C-G formula based on SCr of 0.99 mg/dL).   Medical History: Past Medical History:  Diagnosis Date  . AAA (abdominal aortic aneurysm) ([place])    a. 12/2011 U/S: 3.3 cm prox AAA.  [name] Anxiety   . Aortic insufficiency    a. S/P bioprosthetic AVR;  b. 02/2013 Echo: EF 50-55%, mild to mod AI (mean grad 80mmHg).  . Brain aneurysm    Hx of  . Chest pain    a. H/o nonobs cath;  b. 07/2013 Lexiscan MV: EF 66%, no ischemia.  . Chronic diastolic CHF (congestive heart failure) ([place])    a. 02/2013 Echo: EF 50-55%.  . Diverticulosis   . Gastritis   . Hemorrhoids   . Hepatitis B 1980's  .  Hiatal hernia   . Hypertension   . Moderate mitral regurgitation    a. 02/2013 Echo: Mod MR.  . Moderate tricuspid regurgitation    a. 02/2013 Echo: Mod TR.  [name] Permanent atrial fibrillation ([place])    a. Chronic coumadin.  . Polymyalgia rheumatica ([place])   . Pulmonary hypertension ([place])    a. 02/2013 Echo: PASP 58mmHg.  . S/P aortic valve replacement with bioprosthetic valve    a. [facility].  . S/P placement of cardiac pacemaker    a.  08/2007 s/p MDT [place] PPM.  . Seizures ([place])   . Shingles   . Skin cancer    face, ear  and left hand.  . Tachy-brady syndrome ([place])    a. Severe in the setting of permanent atrial fibrillation with tachybrady syndrome;  b. 08/2007 s/p MDT [place] PPM.  . Thoracic aneurysm    a. 10/2011 CTA: 5.0 x 3.8 cm.    Assessment: 84 year old female with history of afib on Coumadin [facility] with  shortness of breath.   INR 3.0 on admit. Coumadin dose prior to admit was 5mg  on Tues/Sat and 2.5mg  other days, last dose 3/28 PTA. Given INR at upper end of goal, held dose on 3/29. INR now therapeutic, down slightly to 2.96 after held dose.  Hg wnl, plt down to 137, no bleeding issues noted.  Goal of Therapy:  INR 2-3 Monitor platelets by anticoagulation protocol: Yes   Plan:  Warfarin 2.5mg  PO x 1 Monitor daily INR, CBC, s/sx bleeding  Elicia Lamp, PharmD, BCPS Clinical Pharmacist Clinical phone for 02/13/2018 until 3:30pm: [phone] If after 3:30pm, please call main pharmacy at: [phone] 02/13/2018 11:39 AM

## 2018-02-14 DIAGNOSIS — I10 Essential (primary) hypertension: Secondary | ICD-10-CM

## 2018-02-14 DIAGNOSIS — I5031 Acute diastolic (congestive) heart failure: Secondary | ICD-10-CM

## 2018-02-14 DIAGNOSIS — R0600 Dyspnea, unspecified: Secondary | ICD-10-CM

## 2018-02-14 LAB — GLUCOSE, CAPILLARY
GLUCOSE-CAPILLARY: 119 mg/dL — AB (ref 65–99)
GLUCOSE-CAPILLARY: 218 mg/dL — AB (ref 65–99)
Glucose-Capillary: 91 mg/dL (ref 65–99)
Glucose-Capillary: 221 mg/dL — ABNORMAL HIGH (ref 65–99)

## 2018-02-14 LAB — ECHOCARDIOGRAM COMPLETE
HEIGHTINCHES: 63 in
WEIGHTICAEL: 2163.2 [oz_av]

## 2018-02-14 LAB — BASIC METABOLIC PANEL
ANION GAP: 11 (ref 5–15)
BUN: 16 mg/dL (ref 6–20)
CHLORIDE: 95 mmol/L — AB (ref 101–111)
CO2: 34 mmol/L — AB (ref 22–32)
Calcium: 8.5 mg/dL — ABNORMAL LOW (ref 8.9–10.3)
Creatinine, Ser: 1.06 mg/dL — ABNORMAL HIGH (ref 0.44–1.00)
GFR calc Af Amer: 51 mL/min — ABNORMAL LOW (ref >60)
GFR, EST NON AFRICAN AMERICAN: 44 mL/min — AB (ref >60)
Glucose, Bld: 109 mg/dL — ABNORMAL HIGH (ref 65–99)
POTASSIUM: 3.1 mmol/L — AB (ref 3.5–5.1)
Sodium: 140 mmol/L (ref 135–145)

## 2018-02-14 LAB — BRAIN NATRIURETIC PEPTIDE: B NATRIURETIC PEPTIDE 5: 178.6 pg/mL — AB (ref 0.0–100.0)

## 2018-02-14 LAB — HEMOGLOBIN A1C
Hgb A1c MFr Bld: 6 % — ABNORMAL HIGH (ref 4.8–5.6)
Mean Plasma Glucose: 125.5 mg/dL

## 2018-02-14 LAB — PROTIME-INR
INR: 2.53
PROTHROMBIN TIME: 27.1 s — AB (ref 11.4–15.2)

## 2018-02-14 MED ORDER — TORSEMIDE 20 MG PO TABS
40.0000 mg | ORAL_TABLET | Freq: Every day | ORAL | Status: DC
  Administered 2018-02-15: 40 mg via ORAL
  Filled 2018-02-14: qty 2

## 2018-02-14 MED ORDER — WARFARIN SODIUM 5 MG PO TABS
5.0000 mg | ORAL_TABLET | Freq: Once | ORAL | Status: AC
  Administered 2018-02-14: 5 mg via ORAL
  Filled 2018-02-14: qty 1

## 2018-02-14 MED ORDER — POTASSIUM CHLORIDE CRYS ER 20 MEQ PO TBCR
40.0000 meq | EXTENDED_RELEASE_TABLET | Freq: Once | ORAL | Status: AC
  Administered 2018-02-14: 40 meq via ORAL
  Filled 2018-02-14: qty 2

## 2018-02-14 NOTE — Progress Notes (Signed)
Verified with pharmacy that pt's [facility] gtts were not ordered for hospital use from home medicines. Per pharmacy they can order the two parts individually, once an order is placed for it to be given inpatient.  (note copied from post-it on chart)

## 2018-02-14 NOTE — Consult Note (Signed)
CONSULTATION NOTE   Patient Name: [name] Date of Encounter: 02/14/2018 Cardiologist: No primary care provider on file.  Chief Complaint   Shortness of breath  Patient Profile   84 yo female with history of AAA, aortic valve disease s/p bioprosthetic AVR and mild to moderate AI, LVEF 50-55% in the past, and s/p PPM for Tachy-Brady syndrome in 2008. Now asked to see for worsening dyspnea and CHF.  HPI   [name] is a 84 y.o. female who is being seen today for the evaluation of CHF at the request of Dr. Maudie Mercury. This is a pleasant female patient followed by Dr. Rockey Situ, last seen in 11/2017, with history of permanent a-fib on warfarin, s/p bioprosthetic AVR with AI, chronic diastolic CHF, right brachial artery stenosis, tachy-brady syndrome s/p PPM in 2008, TIA and AAA with prior repair. She presented with shortness of breath and hypoxia at home in the 80's. On arrival, found to be in CHF with small bilateral pleural effusions and increased interstitial prominence on chest x-ray. BNP was elevated at 178. Troponin was flat elevated around 0.06 and 0.07. Labs indicated marked hypokalemia at 2.9, now up to 3.1. INR is therapeutic at 3.01. EKG shows a v-paced rhythm at 80. Echo was performed yesterday which shows LVEF of 60-65%, moderate pulmonary hypertension with RVSP of 67 mmHg and a normally functioning aortic valve bioprosthesis with mild AI. Daughter reports recent 3-4 lb weight gain -brought to the attention of cardiology and pulmonary, but did not recommend treatment. She has been diuresed -1.2L overnight, almost 2L negative total and is breathing better. Cardiology is asked to see [name] for acute CHF.  PMHx   Past Medical History:  Diagnosis Date  . AAA (abdominal aortic aneurysm) ([place])    a. 12/2011 U/S: 3.3 cm prox AAA.  [name] Anxiety   . Aortic insufficiency    a. S/P bioprosthetic AVR;  b. 02/2013 Echo: EF 50-55%, mild to mod AI (mean grad 33mHg).  . Brain aneurysm    Hx of  . Chest pain    a. H/o nonobs cath;  b. 07/2013 Lexiscan MV: EF 66%, no ischemia.  . Chronic diastolic CHF (congestive heart failure) ([place]    a. 02/2013 Echo: EF 50-55%.  . Diverticulosis   . Gastritis   . Hemorrhoids   . Hepatitis B 1980's  . Hiatal hernia   . Hypertension   . Moderate mitral regurgitation    a. 02/2013 Echo: Mod MR.  . Moderate tricuspid regurgitation    a. 02/2013 Echo: Mod TR.  .Marland KitchenPermanent atrial fibrillation ([place]    a. Chronic coumadin.  . Polymyalgia rheumatica ([place]   . Pulmonary hypertension ([place]    a. 02/2013 Echo: PASP 584mg.  . S/P aortic valve replacement with bioprosthetic valve    [facility]    a.  08/2007 s/p MDT AdBaileyPM.  . Seizures ([place]  . Shingles   . Skin cancer    face, ear  and left hand.  . Tachy-brady syndrome ([place]   a. Severe in the setting of permanent atrial fibrillation with tachybrady syndrome;  b. 08/2007 s/p MDT AdBensleyPM.  . Thoracic aneurysm    a. 10/2011 CTA: 5.0 x 3.8 cm.    Past Surgical History:  Procedure Laterality Date  . ABDOMINAL AORTIC ANEURYSM REPAIR     Resection  . AORTIC VALVE REPLACEMENT     With bioprosthetic  valve  . APPENDECTOMY    . BREAST BIOPSY    . CEREBRAL ANEURYSM REPAIR    . CHOLECYSTECTOMY    . EP IMPLANTABLE DEVICE N/A 08/20/2016   Procedure: PPM Generator Changeout;  Surgeon: Deboraha Sprang, MD;  Location: [facility];  Service: Cardiovascular;  Laterality: N/A;  . LUNG BIOPSY  2005  . PACEMAKER PLACEMENT  08/2007   Medtronic; AF permanent; s/p pacemaker for bradycardia with now well controlled ventricular response; Digoxin therapy, question level; Lower extremity venous issues  . skin cancer removal     ear, face and hand  . VESICOVAGINAL FISTULA CLOSURE W/ TAH      FAMHx   Family History  Problem Relation Age of Onset  . Cancer Mother   . Heart disease Mother   . Hypertension Mother   .  Varicose Veins Mother   . Heart disease Father   . Hypertension Father   . Heart disease Sister   . Hypertension Sister   . Varicose Veins Sister   . AAA (abdominal aortic aneurysm) Sister   . Heart disease Brother   . Hypertension Brother     SOCHx    reports that she has never smoked. She has never used smokeless tobacco. She reports that she does not drink alcohol or use drugs.  Outpatient Medications   No current facility-administered medications on file prior to encounter.    Current Outpatient Medications on File Prior to Encounter  Medication Sig Dispense Refill  . acetaminophen (TYLENOL) 500 MG tablet Take 1 tablet (500 mg total) by mouth every 6 (six) hours. (Patient taking differently: Take 500 mg by mouth every 6 (six) hours as needed (for pain or headaches). ) 30 tablet 0  . albuterol (PROVENTIL HFA;VENTOLIN HFA) 108 (90 Base) MCG/ACT inhaler Inhale 1 puff into the lungs every 6 (six) hours as needed for wheezing or shortness of breath.    [name] albuterol (PROVENTIL) (2.5 MG/3ML) 0.083% nebulizer solution Inhale 1 vial via nebulizer every 6 hours as needed .J44.9 (Patient taking differently: Take 2.5 mg by nebulization 3 (three) times daily. ) 1080 mL 11  . AMBULATORY NON FORMULARY MEDICATION Medication Name: [employer] DME:[facility] DX:J98.11 1 each 0  . bacitracin ointment Apply 1 application topically 2 (two) times daily. Apply to skin tears, then place a non-stick dressing 120 g 0  . Brinzolamide-Brimonidine (SIMBRINZA) 1-0.2 % SUSP Place 1 drop into both eyes daily.     . budesonide-formoterol (SYMBICORT) 160-4.5 MCG/ACT inhaler Inhale 1 puff into the lungs 2 (two) times daily.    . diclofenac sodium (VOLTAREN) 1 % GEL Apply 4 g topically See admin instructions. Apply 4 grams to both knees two times a day    . flintstones complete (FLINTSTONES) 60 MG chewable tablet Chew 1 tablet by mouth daily.    [name] ipratropium (ATROVENT) 0.02 % nebulizer solution Take 2.5 mLs (0.5 mg total)  by nebulization 4 (four) times daily. As needed J44.9 (Patient taking differently: Take 0.5 mg by nebulization 3 (three) times daily. ) 1080 mL 11  . latanoprost (XALATAN) 0.005 % ophthalmic solution Place 1 drop into both eyes at bedtime.    [name] LORazepam (ATIVAN) 0.5 MG tablet Take 1 tablet (0.5 mg total) by mouth every 8 (eight) hours as needed for anxiety. (Patient taking differently: Take 0.5 mg by mouth 3 (three) times daily. ) 12 tablet 0  . meclizine (ANTIVERT) 25 MG tablet Take 12.5 mg by mouth 3 (three) times daily as needed for dizziness.    [name]  pantoprazole (PROTONIX) 40 MG tablet Take 40 mg by mouth daily.     [name] PARoxetine (PAXIL) 10 MG tablet Take 10 mg by mouth daily.  1  . polyethylene glycol (MIRALAX / GLYCOLAX) packet Take 17 g by mouth daily as needed for moderate constipation. (Patient taking differently: Take 17 g by mouth daily. ) 14 each 0  . potassium chloride SA (K-DUR,KLOR-CON) 20 MEQ tablet TAKE ONE TABLET BY MOUTH EACH EVENING (Patient taking differently: Take 20 mEq by mouth in the evening) 90 tablet 3  . predniSONE (DELTASONE) 5 MG tablet Take 7.5 mg by mouth daily.  0  . torsemide (DEMADEX) 20 MG tablet Take 1.5 tablets (30 mg total) by mouth daily. (Patient taking differently: Take 20 mg by mouth daily. )    . traMADol (ULTRAM) 50 MG tablet Take 1 tablet (50 mg total) by mouth every 6 (six) hours as needed for moderate pain. (Patient taking differently: Take 25 mg by mouth at bedtime. ) 30 tablet 0  . traZODone (DESYREL) 50 MG tablet Take 75 mg by mouth at bedtime.     [name] warfarin (COUMADIN) 5 MG tablet TAKE AS DIRECTED BY THE COUMADIN CLINIC (Patient taking differently: Take 2.5-5 mg by mouth See admin instructions. Take 2.5 mg by mouth in the evening at 6 PM on Sun/Mon/Wed/Thurs/Fri and 5 mg on Tues/Sat) 30 tablet 3  . oxyCODONE (ROXICODONE) 5 MG immediate release tablet Take 0.5 tablets (2.5 mg total) by mouth every 6 (six) hours as needed for severe pain. (Patient not  taking: Reported on 02/12/2018) 10 tablet 0    Inpatient Medications    Scheduled Meds: . insulin aspart  0-9 Units Subcutaneous TID WC  . ipratropium  0.5 mg Nebulization TID  . latanoprost  1 drop Both Eyes QHS  . LORazepam  0.5 mg Oral TID  . mometasone-formoterol  2 puff Inhalation BID  . pantoprazole  40 mg Oral Daily  . PARoxetine  10 mg Oral Daily  . pneumococcal 23 valent vaccine  0.5 mL Intramuscular Tomorrow-1000  . polyethylene glycol  17 g Oral Daily  . potassium chloride SA  20 mEq Oral Daily  . predniSONE  7.5 mg Oral Daily  . sodium chloride flush  3 mL Intravenous Q12H  . torsemide  60 mg Oral Daily  . traMADol  25 mg Oral QHS  . traZODone  75 mg Oral QHS  . warfarin  5 mg Oral ONCE-1800  . Warfarin - Pharmacist Dosing Inpatient   Does not apply q1800    Continuous Infusions: . sodium chloride      PRN Meds: sodium chloride, acetaminophen **OR** acetaminophen, albuterol, meclizine, sodium chloride flush   ALLERGIES   Allergies  Allergen Reactions  . Advair Diskus [Fluticasone-Salmeterol] Anaphylaxis  . Furosemide Shortness Of Breath  . Other Shortness Of Breath and Other (See Comments)    IODINATED CONTRAST MEDIA: Chills and fever also  . Salmeterol Anaphylaxis    THROAT CLOSES with Advair  . Iohexol Other (See Comments)    Desc:  Pt requires a 13 hr prep, when doing exam with contrast....fever, chills, dyspnea (slg/04/27/09--given 2 hour solumedrol/ benadryl prep--no reaction).    . Sulfonamide Derivatives Nausea And Vomiting  . Verapamil Hives, Nausea And Vomiting and Other (See Comments)    Loss of appetite also   . Adhesive [Tape] Other (See Comments)    TEARS and BRUISES SKIN  . Amoxicillin-Pot Clavulanate Nausea Only and Other (See Comments)    NAUSEA AND STOMACH PAIN  .  Contrast Media [Iodinated Diagnostic Agents] Other (See Comments)    "Froze" and fever  . Ibuprofen Other (See Comments)    Has liver damage  . Levofloxacin Nausea Only and  Other (See Comments)    NAUSEA AND STOMACH PAIN  . Macrobid [Nitrofurantoin Monohyd Macro] Nausea Only and Other (See Comments)    Severe stomach pain  . Latex Rash  . Penicillins Rash and Other (See Comments)    Has patient had a PCN reaction causing immediate rash, facial/tongue/throat swelling, SOB or lightheadedness with hypotension: Yes Has patient had a PCN reaction causing severe rash involving mucus membranes or skin necrosis: Unk Has patient had a PCN reaction that required hospitalization: Unk Has patient had a PCN reaction occurring within the last 10 years: No If all of the above answers are "NO", then may proceed with Cephalosporin use.     ROS   Pertinent items noted in HPI and remainder of comprehensive ROS otherwise negative.  Vitals   Vitals:   02/13/18 2330 02/14/18 0617 02/14/18 0835 02/14/18 1205  BP: 99/70 (!) 133/108  (!) 170/117  Pulse: (!) 58 (!) 57  79  Resp: 18 18  20   Temp: (!) 97.3 F (36.3 C) (!) 97.5 F (36.4 C)  (!) 97.5 F (36.4 C)  TempSrc: Oral Oral  Oral  SpO2: 99% 98% 98% 99%  Weight:  133 lb 4.8 oz (60.5 kg)    Height:        Intake/Output Summary (Last 24 hours) at 02/14/2018 1400 Last data filed at 02/14/2018 1300 Gross per 24 hour  Intake 720 ml  Output 1500 ml  Net -780 ml   Filed Weights   02/12/18 2038 02/13/18 0559 02/14/18 0617  Weight: 138 lb 14.4 oz (63 kg) 135 lb 3.2 oz (61.3 kg) 133 lb 4.8 oz (60.5 kg)    Physical Exam   General appearance: alert, appears stated age, cachectic and no distress Neck: no carotid bruit, no JVD and thyroid not enlarged, symmetric, no tenderness/mass/nodules Lungs: diminished breath sounds bibasilar Heart: irregularly irregular rhythm Abdomen: soft, non-tender; bowel sounds normal; no masses,  no organomegaly Extremities: extremities normal, atraumatic, no cyanosis or edema Pulses: 2+ and symmetric Skin: multiple eccyhmoses, thin skin Neurologic: Grossly normal Psych: Pleasant  Labs     Results for orders placed or performed during the hospital encounter of 02/12/18 (from the past 48 hour(s))  Brain natriuretic peptide     Status: Abnormal   Collection Time: 02/12/18  2:49 PM  Result Value Ref Range   B Natriuretic Peptide 137.6 (H) 0.0 - 100.0 pg/mL    Comment: Performed at [facility], [address] [place], [place] [address]  Basic metabolic panel     Status: Abnormal   Collection Time: 02/12/18  2:52 PM  Result Value Ref Range   Sodium 137 135 - 145 mmol/L   Potassium 4.4 3.5 - 5.1 mmol/L   Chloride 96 (L) 101 - 111 mmol/L   CO2 28 22 - 32 mmol/L   Glucose, Bld 205 (H) 65 - 99 mg/dL   BUN 26 (H) 6 - 20 mg/dL   Creatinine, Ser 1.12 (H) 0.44 - 1.00 mg/dL   Calcium 8.9 8.9 - 10.3 mg/dL   GFR calc non Af Amer 41 (L) >60 mL/min   GFR calc Af Amer 48 (L) >60 mL/min    Comment: (NOTE) The eGFR has been calculated using the CKD EPI equation. This calculation has not been validated in all clinical situations. eGFR's  persistently <60 mL/min signify possible Chronic Kidney Disease.    Anion gap 13 5 - 15    Comment: Performed at [facility] [address], [place], [place] [address]  CBC     Status: Abnormal   Collection Time: 02/12/18  2:52 PM  Result Value Ref Range   WBC 6.1 4.0 - 10.5 K/uL   RBC 4.51 3.87 - 5.11 MIL/uL   Hemoglobin 13.3 12.0 - 15.0 g/dL   HCT 42.8 36.0 - 46.0 %   MCV 94.9 78.0 - 100.0 fL   MCH 29.5 26.0 - 34.0 pg   MCHC 31.1 30.0 - 36.0 g/dL   RDW 14.7 11.5 - 15.5 %   Platelets 149 (L) 150 - 400 K/uL    Comment: Performed at [facility], [place] [address], [place], [place] [address]  Protime-INR     Status: Abnormal   Collection Time: 02/12/18  2:52 PM  Result Value Ref Range   Prothrombin Time 31.0 (H) 11.4 - 15.2 seconds   INR 3.01     Comment: Performed at [facility] [address] [place], [place] [address]  I-stat troponin, ED     Status: None   Collection Time: 02/12/18  3:01 PM  Result Value Ref  Range   Troponin i, poc 0.06 0.00 - 0.08 ng/mL   Comment 3            Comment: Due to the release kinetics of cTnI, a negative result within the first hours of the onset of symptoms does not rule out myocardial infarction with certainty. If myocardial infarction is still suspected, repeat the test at appropriate intervals.   Troponin I     Status: Abnormal   Collection Time: 02/12/18  6:49 PM  Result Value Ref Range   Troponin I 0.06 (HH) <0.03 ng/mL    Comment: CRITICAL RESULT CALLED TO, READ BACK BY AND VERIFIED WITH: D.RUSSELL,RN 02/12/18 2020 DAVISB Performed at [facility], [place] [address], [place], [place] [address]   Troponin I (q 6hr x 3)     Status: Abnormal   Collection Time: 02/12/18  8:31 PM  Result Value Ref Range   Troponin I 0.07 (HH) <0.03 ng/mL    Comment: CRITICAL VALUE NOTED.  VALUE IS CONSISTENT WITH PREVIOUSLY REPORTED AND CALLED VALUE. Performed at [facility], [place] [address] [place], [place] [address]   Troponin I (q 6hr x 3)     Status: Abnormal   Collection Time: 02/13/18  2:28 AM  Result Value Ref Range   Troponin I 0.06 (HH) <0.03 ng/mL    Comment: CRITICAL VALUE NOTED.  VALUE IS CONSISTENT WITH PREVIOUSLY REPORTED AND CALLED VALUE. Performed at [facility], [place] [address], [place], [place] [address]   Urinalysis, Routine w reflex microscopic     Status: Abnormal   Collection Time: 02/13/18  2:52 AM  Result Value Ref Range   Color, Urine YELLOW YELLOW   APPearance HAZY (A) CLEAR   Specific Gravity, Urine 1.006 1.005 - 1.030   pH 8.0 5.0 - 8.0   Glucose, UA NEGATIVE NEGATIVE mg/dL   Hgb urine dipstick NEGATIVE NEGATIVE   Bilirubin Urine NEGATIVE NEGATIVE   Ketones, ur NEGATIVE NEGATIVE mg/dL   Protein, ur NEGATIVE NEGATIVE mg/dL   Nitrite NEGATIVE NEGATIVE   Leukocytes, UA NEGATIVE NEGATIVE    Comment: Performed at [facility] [address], [place], [place] [address]  Glucose, capillary  Status: None    Collection Time: 02/13/18  7:55 AM  Result Value Ref Range   Glucose-Capillary 98 65 - 99 mg/dL   Comment 1 Notify RN    Comment 2 Document in Chart   Troponin I (q 6hr x 3)     Status: Abnormal   Collection Time: 02/13/18  8:12 AM  Result Value Ref Range   Troponin I 0.06 (HH) <0.03 ng/mL    Comment: CRITICAL VALUE NOTED.  VALUE IS CONSISTENT WITH PREVIOUSLY REPORTED AND CALLED VALUE. Performed at [facility], [place] [address], [place], [place] [address]   Comprehensive metabolic panel     Status: Abnormal   Collection Time: 02/13/18  8:12 AM  Result Value Ref Range   Sodium 142 135 - 145 mmol/L   Potassium 2.9 (L) 3.5 - 5.1 mmol/L    Comment: DELTA CHECK NOTED   Chloride 96 (L) 101 - 111 mmol/L   CO2 32 22 - 32 mmol/L   Glucose, Bld 99 65 - 99 mg/dL   BUN 17 6 - 20 mg/dL   Creatinine, Ser 0.99 0.44 - 1.00 mg/dL   Calcium 8.4 (L) 8.9 - 10.3 mg/dL   Total Protein 5.8 (L) 6.5 - 8.1 g/dL   Albumin 3.5 3.5 - 5.0 g/dL   AST 24 15 - 41 U/L   ALT 16 14 - 54 U/L   Alkaline Phosphatase 67 38 - 126 U/L   Total Bilirubin 1.8 (H) 0.3 - 1.2 mg/dL   GFR calc non Af Amer 48 (L) >60 mL/min   GFR calc Af Amer 55 (L) >60 mL/min    Comment: (NOTE) The eGFR has been calculated using the CKD EPI equation. This calculation has not been validated in all clinical situations. eGFR's persistently <60 mL/min signify possible Chronic Kidney Disease.    Anion gap 14 5 - 15    Comment: Performed at [facility] [address], [place], [place] [address]  CBC     Status: Abnormal   Collection Time: 02/13/18  8:12 AM  Result Value Ref Range   WBC 4.2 4.0 - 10.5 K/uL   RBC 4.50 3.87 - 5.11 MIL/uL   Hemoglobin 12.7 12.0 - 15.0 g/dL   HCT 42.7 36.0 - 46.0 %   MCV 94.9 78.0 - 100.0 fL   MCH 28.2 26.0 - 34.0 pg   MCHC 29.7 (L) 30.0 - 36.0 g/dL   RDW 14.4 11.5 - 15.5 %   Platelets 137 (L) 150 - 400 K/uL    Comment: Performed at [facility], [place] [address], [place], [place] [address]   Protime-INR     Status: Abnormal   Collection Time: 02/13/18 11:49 AM  Result Value Ref Range   Prothrombin Time 30.6 (H) 11.4 - 15.2 seconds   INR 2.96     Comment: Performed at [facility] [address], [place], [place] [address]  Glucose, capillary     Status: Abnormal   Collection Time: 02/13/18 12:55 PM  Result Value Ref Range   Glucose-Capillary 172 (H) 65 - 99 mg/dL  Glucose, capillary     Status: Abnormal   Collection Time: 02/13/18  4:27 PM  Result Value Ref Range   Glucose-Capillary 168 (H) 65 - 99 mg/dL   Comment 1 Notify RN    Comment 2 Document in Chart   Glucose, capillary     Status: Abnormal   Collection Time: 02/13/18  8:47 PM  Result Value Ref Range  Glucose-Capillary 101 (H) 65 - 99 mg/dL  Basic metabolic panel     Status: Abnormal   Collection Time: 02/14/18  5:40 AM  Result Value Ref Range   Sodium 140 135 - 145 mmol/L   Potassium 3.1 (L) 3.5 - 5.1 mmol/L   Chloride 95 (L) 101 - 111 mmol/L   CO2 34 (H) 22 - 32 mmol/L   Glucose, Bld 109 (H) 65 - 99 mg/dL   BUN 16 6 - 20 mg/dL   Creatinine, Ser 1.06 (H) 0.44 - 1.00 mg/dL   Calcium 8.5 (L) 8.9 - 10.3 mg/dL   GFR calc non Af Amer 44 (L) >60 mL/min   GFR calc Af Amer 51 (L) >60 mL/min    Comment: (NOTE) The eGFR has been calculated using the CKD EPI equation. This calculation has not been validated in all clinical situations. eGFR's persistently <60 mL/min signify possible Chronic Kidney Disease.    Anion gap 11 5 - 15    Comment: Performed at [facility] [address], [place], [place] [address]  Hemoglobin A1c     Status: Abnormal   Collection Time: 02/14/18  5:40 AM  Result Value Ref Range   Hgb A1c MFr Bld 6.0 (H) 4.8 - 5.6 %    Comment: (NOTE) Pre diabetes:          5.7%-6.4% Diabetes:              >6.4% Glycemic control for   <7.0% adults with diabetes    Mean Plasma Glucose 125.5 mg/dL    Comment: Performed at [facility] [address] [place], [place]  [address]  Brain natriuretic peptide     Status: Abnormal   Collection Time: 02/14/18  5:40 AM  Result Value Ref Range   B Natriuretic Peptide 178.6 (H) 0.0 - 100.0 pg/mL    Comment: Performed at [facility] [address], [place], [place] [address]  Protime-INR     Status: Abnormal   Collection Time: 02/14/18  5:40 AM  Result Value Ref Range   Prothrombin Time 27.1 (H) 11.4 - 15.2 seconds   INR 2.53     Comment: Performed at [facility] [address], [place],  [address]  Glucose, capillary     Status: None   Collection Time: 02/14/18  7:34 AM  Result Value Ref Range   Glucose-Capillary 91 65 - 99 mg/dL   Comment 1 Notify RN   Glucose, capillary     Status: Abnormal   Collection Time: 02/14/18 11:26 AM  Result Value Ref Range   Glucose-Capillary 221 (H) 65 - 99 mg/dL   Comment 1 Notify RN     ECG   V-paced at 80 - Personally Reviewed  Telemetry   V-paced - Personally Reviewed  Radiology   Dg Chest 2 View  Result Date: 02/12/2018 CLINICAL DATA:  Shortness of breath.  Cough. EXAM: CHEST - 2 VIEW COMPARISON:  Chest x-ray 02/09/2018. FINDINGS: Thoracic aortic prominence again noted consistent with known thoracic aortic aneurysm. No change from prior exam cardiac pacer with lead tip over the right ventricle. Prior CABG. Prior cardiac valve replacement. Bilateral mild interstitial prominence and bilateral pleural effusions. Findings suggest mild CHF. Mild basilar atelectasis again noted. IMPRESSION: 1. Cardiac pacer stable position. Cardiac valve replacement. Cardiomegaly with mild increase in bilateral interstitial prominence and small bilateral pleural effusions again noted. Findings consistent with CHF. Persistent mild bibasilar atelectasis. 2.  Thoracic aortic aneurysm again noted.  No interim change. Electronically Signed   By: Marcello Moores  Register   On: 02/12/2018 15:15    Cardiac Studies   LV EF: 60% -    65%  ------------------------------------------------------------------- Indications:      CHF - 428.0.  ------------------------------------------------------------------- History:   PMH:   Dyspnea.  Congestive heart failure.  Risk factors:  Abdominal aortic aneurysm. Hypertension.  ------------------------------------------------------------------- Study Conclusions  - Left ventricle: The cavity size was normal. There was mild   concentric hypertrophy. Systolic function was normal. The   estimated ejection fraction was in the range of 60% to 65%. Wall   motion was normal; there were no regional wall motion   abnormalities. - Aortic valve: Moderately calcified annulus. Mildly thickened,   mildly calcified leaflets. A bioprosthesis was present and   functioning normally. There was mild stenosis. There was mild   regurgitation. Mean gradient (S): 11 mm Hg. Valve area (VTI):   1.23 cm^2. Valve area (Vmax): 1 cm^2. Valve area (Vmean): 1.12   cm^2. - Mitral valve: Calcified annulus. There was mild to moderate   regurgitation. - Left atrium: The atrium was severely dilated. - Right ventricle: Pacer wire or catheter noted in right ventricle. - Right atrium: The atrium was moderately dilated. - Tricuspid valve: There was moderate regurgitation. - Pulmonic valve: There was mild regurgitation. - Pulmonary arteries: PA peak pressure: 67 mm Hg (S). - Pericardium, extracardiac: There was a left pleural effusion.  Impressions:  - The right ventricular systolic pressure was increased consistent   with moderate pulmonary hypertension.  Impression   Principal Problem:   Acute diastolic (congestive) heart failure (HCC) Active Problems:   CHF (congestive heart failure) (HCC)   Hypertension   Dyspnea   Recommendation   1. Breathing is much better with diuresis - creatinine has improved, but is trending up somewhat today. She appears euvolemic on exam - weight is at dry weight of  133 lbs. Would switch to po torsemide 40 mg daily (was on 20-30 mg at home) - otherwise continue current medications. She reports some dietary non-compliance recently with eating out more. Had follow-up with Dr. Rockey Situ tomorrow- will need to reschedule later next week.  Thanks for the consultation. Cardiology will sign-off. Call with questions.   Time Spent Directly with Patient:  I have spent a total of 45 minutes with the patient reviewing hospital notes, telemetry, EKGs, labs and examining the patient as well as establishing an assessment and plan that was discussed personally with the patient. > 50% of time was spent in direct patient care.  Length of Stay:  LOS: 2 days   Pixie Casino, MD, [facility], [place] Director of the Advanced Lipid Disorders &  Cardiovascular Risk Reduction Clinic Diplomate of the American Board of Clinical Lipidology Attending Cardiologist  Direct Dial: [phone]  Fax: [phone]  Website:  [email or web address] 02/14/2018, 2:00 PM

## 2018-02-14 NOTE — Progress Notes (Signed)
[facility]                                                                              Patient Demographics  [name], is a 84 y.o. female, DOB - 1922-06-24, ENI:[id number]  Admit date - 02/12/2018   Admitting Physician Jani Gravel, MD  Outpatient Primary MD for the patient is Jodi Marble, MD  Outpatient specialists:   LOS - 2  days    Chief Complaint  Patient presents with  . Shortness of Breath       Brief summary  [name]  is a 84 y.o. female with medical history significant for but not limited to diastolic CHF (EF 61-44%), COPD and Pafib presenting with progressively worsening shortness of breath and hypoxia with pulse oximetry in the 80s at home.  Chest x-ray showed mild pulmonary edema with bilateral small pleural effusions consistent with CHF, negative twelve-lead EKG for acute ST-T wave changes.  Patient admitted for CHF      Assessment & Plan    Principal Problem:   Dyspnea Active Problems:   CHF (congestive heart failure) (HCC)   Hypertension  #1 acute decompensation of diastolic CHF: Continue telemetry monitoring 2D echocardiogram completed yesterday - results pending Serial troponins mildly elevated (in the setting of acute kidney injury) Continue diuretics and monitor electrolytes and renal function Cardiology consulted by admitting physician - recalled today by me  #2 paroxysmal atrial fibrillation: Stable Continue Coumadin  #3 COPD: Continue bronchodilators  #4 acute kidney injury: Mild Creatinine was 0.92 on 01/14/2018, admitted with creatinine of 1.12 Likely due to hemodynamic changes associated with acute CHF Monitor renal function with electrolytes  #5 hyperglycemia: No prior h/o DM  A1c-6% Sliding-scale insulin as needed   Code Status: Full code DVT Prophylaxis: Coumadin Family Communication: Discussed in detail with the patient, all imaging results, lab results explained to the patient or  *   Disposition Plan: Home Time Spent in minutes   25 minutes  Procedures:    Consultants:   Cardiology  Antimicrobials:      Medications  Scheduled Meds: . insulin aspart  0-9 Units Subcutaneous TID WC  . ipratropium  0.5 mg Nebulization TID  . latanoprost  1 drop Both Eyes QHS  . LORazepam  0.5 mg Oral TID  . mometasone-formoterol  2 puff Inhalation BID  . pantoprazole  40 mg Oral Daily  . PARoxetine  10 mg Oral Daily  . pneumococcal 23 valent vaccine  0.5 mL Intramuscular Tomorrow-1000  . polyethylene glycol  17 g Oral Daily  . potassium chloride SA  20 mEq Oral Daily  . predniSONE  7.5 mg Oral Daily  . sodium chloride flush  3 mL Intravenous Q12H  . torsemide  60 mg Oral Daily  . traMADol  25 mg Oral QHS  . traZODone  75 mg Oral QHS  . Warfarin - Pharmacist Dosing Inpatient   Does not apply q1800   Continuous Infusions: . sodium chloride     PRN Meds:.sodium chloride, acetaminophen **OR** acetaminophen, albuterol, meclizine, sodium chloride flush   Antibiotics   Anti-infectives (From admission, onward)   None  Subjective:   [name] was seen and examined today.  No acute events noted overnight   Objective:   Vitals:   02/13/18 1958 02/13/18 2330 02/14/18 0617 02/14/18 0835  BP:  99/70 (!) 133/108   Pulse: 80 (!) 58 (!) 57   Resp: 18 18 18    Temp:  (!) 97.3 F (36.3 C) (!) 97.5 F (36.4 C)   TempSrc:  Oral Oral   SpO2: 98% 99% 98% 98%  Weight:   60.5 kg (133 lb 4.8 oz)   Height:        Intake/Output Summary (Last 24 hours) at 02/14/2018 4098 Last data filed at 02/14/2018 1191 Gross per 24 hour  Intake 840 ml  Output 2100 ml  Net -1260 ml     Wt Readings from Last 3 Encounters:  02/14/18 60.5 kg (133 lb 4.8 oz)  02/09/18 62.1 kg (137 lb)  01/13/18 60.3 kg (133 lb)     Exam  General: NAD  HEENT: NCAT,  PERRL,MMM  Neck: SUPPLE, (-) JVD  Cardiovascular: RRR, (-) GALLOP, (-) MURMUR  Respiratory: Bilateral  basilar crackles  Gastrointestinal: SOFT, (-) DISTENSION, BS(+), (_) TENDERNESS  Ext: (-) CYANOSIS, (-) EDEMA  Neuro: A, OX 3              Psych:NORMAL AFFECT/MOOD   Data Reviewed:  I have personally reviewed following labs and imaging studies  Micro Results No results found for this or any previous visit (from the past 240 hour(s)).  Radiology Reports Dg Chest 2 View  Result Date: 02/12/2018 CLINICAL DATA:  Shortness of breath.  Cough. EXAM: CHEST - 2 VIEW COMPARISON:  Chest x-ray 02/09/2018. FINDINGS: Thoracic aortic prominence again noted consistent with known thoracic aortic aneurysm. No change from prior exam cardiac pacer with lead tip over the right ventricle. Prior CABG. Prior cardiac valve replacement. Bilateral mild interstitial prominence and bilateral pleural effusions. Findings suggest mild CHF. Mild basilar atelectasis again noted. IMPRESSION: 1. Cardiac pacer stable position. Cardiac valve replacement. Cardiomegaly with mild increase in bilateral interstitial prominence and small bilateral pleural effusions again noted. Findings consistent with CHF. Persistent mild bibasilar atelectasis. 2.  Thoracic aortic aneurysm again noted.  No interim change. Electronically Signed   By: Marcello Moores  Register   On: 02/12/2018 15:15   Dg Chest 2 View  Result Date: 02/10/2018 CLINICAL DATA:  Shortness of breath, chest pain. EXAM: CHEST - 2 VIEW COMPARISON:  Radiograph of Apr 15, 2017. FINDINGS: Stable cardiomegaly. Aneurysmal dilatation of descending thoracic aorta is noted. Atherosclerosis of abdominal aorta. Stable position of left-sided pacemaker. Status post aortic valve replacement. No pneumothorax is noted. Mild bibasilar subsegmental atelectasis is noted. Mild right pleural effusion is noted. IMPRESSION: Mild bibasilar subsegmental atelectasis. Mild right pleural effusion. Thoracic aortic aneurysm is noted. CTA of the chest is recommended for further evaluation. Aortic Atherosclerosis  (ICD10-I70.0). Electronically Signed   By: Marijo Conception, M.D.   On: 02/10/2018 08:48    Lab Data:  CBC: Recent Labs  Lab 02/12/18 1452 02/13/18 0812  WBC 6.1 4.2  HGB 13.3 12.7  HCT 42.8 42.7  MCV 94.9 94.9  PLT 149* 478*   Basic Metabolic Panel: Recent Labs  Lab 02/09/18 1610 02/12/18 1452 02/13/18 0812 02/14/18 0540  NA 136 137 142 140  K 3.6 4.4 2.9* 3.1*  CL 94* 96* 96* 95*  CO2 30 28 32 34*  GLUCOSE 198* 205* 99 109*  BUN 33* 26* 17 16  CREATININE 1.17* 1.12* 0.99 1.06*  CALCIUM 8.6* 8.9  8.4* 8.5*   GFR: Estimated Creatinine Clearance: 27.4 mL/min (A) (by C-G formula based on SCr of 1.06 mg/dL (H)). Liver Function Tests: Recent Labs  Lab 02/13/18 0812  AST 24  ALT 16  ALKPHOS 67  BILITOT 1.8*  PROT 5.8*  ALBUMIN 3.5   No results for input(s): LIPASE, AMYLASE in the last 168 hours. No results for input(s): AMMONIA in the last 168 hours. Coagulation Profile: Recent Labs  Lab 02/08/18 02/12/18 1452 02/13/18 1149 02/14/18 0540  INR 2.8 3.01 2.96 2.53   Cardiac Enzymes: Recent Labs  Lab 02/12/18 1849 02/12/18 2031 02/13/18 0228 02/13/18 0812  TROPONINI 0.06* 0.07* 0.06* 0.06*   BNP (last 3 results) No results for input(s): PROBNP in the last 8760 hours. HbA1C: Recent Labs    02/14/18 0540  HGBA1C 6.0*   CBG: Recent Labs  Lab 02/13/18 0755 02/13/18 1255 02/13/18 1627 02/13/18 2047 02/14/18 0734  GLUCAP 98 172* 168* 101* 91   Lipid Profile: No results for input(s): CHOL, HDL, LDLCALC, TRIG, CHOLHDL, LDLDIRECT in the last 72 hours. Thyroid Function Tests: No results for input(s): TSH, T4TOTAL, FREET4, T3FREE, THYROIDAB in the last 72 hours. Anemia Panel: No results for input(s): VITAMINB12, FOLATE, FERRITIN, TIBC, IRON, RETICCTPCT in the last 72 hours. Urine analysis:    Component Value Date/Time   COLORURINE YELLOW 02/13/2018 0252   APPEARANCEUR HAZY (A) 02/13/2018 0252   LABSPEC 1.006 02/13/2018 0252   PHURINE 8.0  02/13/2018 0252   GLUCOSEU NEGATIVE 02/13/2018 0252   HGBUR NEGATIVE 02/13/2018 0252   BILIRUBINUR NEGATIVE 02/13/2018 0252   KETONESUR NEGATIVE 02/13/2018 0252   PROTEINUR NEGATIVE 02/13/2018 0252   UROBILINOGEN 1.0 08/01/2015 2030   NITRITE NEGATIVE 02/13/2018 0252   LEUKOCYTESUR NEGATIVE 02/13/2018 0252     Benito Mccreedy M.D. [facility] 02/14/2018, 8:37 AM  Pager: [phone] Between 7am to 7pm - call Pager - [phone]  After 7pm go to [email or web address] - password [email or web address]  Call night coverage person covering after 7pm

## 2018-02-14 NOTE — Progress Notes (Signed)
[place] for coumadin Indication: atrial fibrillation  Allergies  Allergen Reactions  . Advair Diskus [Fluticasone-Salmeterol] Anaphylaxis  . Furosemide Shortness Of Breath  . Other Shortness Of Breath and Other (See Comments)    IODINATED CONTRAST MEDIA: Chills and fever also  . Salmeterol Anaphylaxis    THROAT CLOSES with Advair  . Iohexol Other (See Comments)    Desc:  Pt requires a 13 hr prep, when doing exam with contrast....fever, chills, dyspnea (slg/04/27/09--given 2 hour solumedrol/ benadryl prep--no reaction).    . Sulfonamide Derivatives Nausea And Vomiting  . Verapamil Hives, Nausea And Vomiting and Other (See Comments)    Loss of appetite also   . Adhesive [Tape] Other (See Comments)    TEARS and BRUISES SKIN  . Amoxicillin-Pot Clavulanate Nausea Only and Other (See Comments)    NAUSEA AND STOMACH PAIN  . Contrast Media [Iodinated Diagnostic Agents] Other (See Comments)    "Froze" and fever  . Ibuprofen Other (See Comments)    Has liver damage  . Levofloxacin Nausea Only and Other (See Comments)    NAUSEA AND STOMACH PAIN  . Macrobid [Nitrofurantoin Monohyd Macro] Nausea Only and Other (See Comments)    Severe stomach pain  . Latex Rash  . Penicillins Rash and Other (See Comments)    Has patient had a PCN reaction causing immediate rash, facial/tongue/throat swelling, SOB or lightheadedness with hypotension: Yes Has patient had a PCN reaction causing severe rash involving mucus membranes or skin necrosis: Unk Has patient had a PCN reaction that required hospitalization: Unk Has patient had a PCN reaction occurring within the last 10 years: No If all of the above answers are "NO", then may proceed with Cephalosporin use.     Patient Measurements: Height: 5\' 3"  (160 cm) Weight: 133 lb 4.8 oz (60.5 kg) IBW/kg (Calculated) : 52.4   Vital Signs: Temp: 97.5 F (36.4 C) (03/31 0617) Temp Source: Oral (03/31 0617) BP:  133/108 (03/31 0617) Pulse Rate: 57 (03/31 0617)  Labs: Recent Labs    02/12/18 1452  02/12/18 2031 02/13/18 0228 02/13/18 0812 02/13/18 1149 02/14/18 0540  HGB 13.3  --   --   --  12.7  --   --   HCT 42.8  --   --   --  42.7  --   --   PLT 149*  --   --   --  137*  --   --   LABPROT 31.0*  --   --   --   --  30.6* 27.1*  INR 3.01  --   --   --   --  2.96 2.53  CREATININE 1.12*  --   --   --  0.99  --  1.06*  TROPONINI  --    < > 0.07* 0.06* 0.06*  --   --    < > = values in this interval not displayed.    Estimated Creatinine Clearance: 27.4 mL/min (A) (by C-G formula based on SCr of 1.06 mg/dL (H)).   Medical History: Past Medical History:  Diagnosis Date  . AAA (abdominal aortic aneurysm) ([place])    a. 12/2011 U/S: 3.3 cm prox AAA.  [name] Anxiety   . Aortic insufficiency    a. S/P bioprosthetic AVR;  b. 02/2013 Echo: EF 50-55%, mild to mod AI (mean grad 67mmHg).  . Brain aneurysm    Hx of  . Chest pain    a. H/o nonobs cath;  b. 07/2013 Lexiscan MV:  EF 66%, no ischemia.  . Chronic diastolic CHF (congestive heart failure) ([place])    a. 02/2013 Echo: EF 50-55%.  . Diverticulosis   . Gastritis   . Hemorrhoids   . Hepatitis B 1980's  . Hiatal hernia   . Hypertension   . Moderate mitral regurgitation    a. 02/2013 Echo: Mod MR.  . Moderate tricuspid regurgitation    a. 02/2013 Echo: Mod TR.  [name] Permanent atrial fibrillation ([place])    a. Chronic coumadin.  . Polymyalgia rheumatica ([place])   . Pulmonary hypertension ([place])    a. 02/2013 Echo: PASP 24mmHg.  . S/P aortic valve replacement with bioprosthetic valve    a. [facility].  . S/P placement of cardiac pacemaker    a.  08/2007 s/p MDT [place] PPM.  . Seizures ([place])   . Shingles   . Skin cancer    face, ear  and left hand.  . Tachy-brady syndrome ([place])    a. Severe in the setting of permanent atrial fibrillation with tachybrady syndrome;  b. 08/2007 s/p MDT [place] PPM.  . Thoracic aneurysm    a.  10/2011 CTA: 5.0 x 3.8 cm.    Assessment: 84 year old female with history of afib on Coumadin presents [facility] with shortness of breath.   INR 3.0 on admit. Coumadin dose prior to admit was 5mg  on Tues/Sat and 2.5mg  other days, last dose 3/28 PTA. Given INR at upper end of goal, held dose on 3/29. INR now therapeutic, down slightly to 2.53.  Hg wnl, plt down to 137, no bleeding issues noted.  Goal of Therapy:  INR 2-3 Monitor platelets by anticoagulation protocol: Yes   Plan:  Warfarin 5mg  PO x 1 Monitor daily INR, CBC, s/sx bleeding  Elicia Lamp, PharmD, BCPS Clinical Pharmacist Clinical phone for 02/14/2018 until 3:30pm: [phone] If after 3:30pm, please call main pharmacy at: [phone] 02/14/2018 11:46 AM

## 2018-02-15 ENCOUNTER — Ambulatory Visit: Payer: Medicare Other | Admitting: Cardiovascular Disease

## 2018-02-15 LAB — BASIC METABOLIC PANEL
Anion gap: 9 (ref 5–15)
BUN: 17 mg/dL (ref 6–20)
CO2: 36 mmol/L — ABNORMAL HIGH (ref 22–32)
Calcium: 8.8 mg/dL — ABNORMAL LOW (ref 8.9–10.3)
Chloride: 95 mmol/L — ABNORMAL LOW (ref 101–111)
Creatinine, Ser: 1.02 mg/dL — ABNORMAL HIGH (ref 0.44–1.00)
GFR calc Af Amer: 53 mL/min — ABNORMAL LOW (ref >60)
GFR calc non Af Amer: 46 mL/min — ABNORMAL LOW (ref >60)
GLUCOSE: 129 mg/dL — AB (ref 65–99)
POTASSIUM: 3.8 mmol/L (ref 3.5–5.1)
Sodium: 140 mmol/L (ref 135–145)

## 2018-02-15 LAB — PROTIME-INR
INR: 1.89
Prothrombin Time: 21.6 seconds — ABNORMAL HIGH (ref 11.4–15.2)

## 2018-02-15 LAB — GLUCOSE, CAPILLARY
GLUCOSE-CAPILLARY: 261 mg/dL — AB (ref 65–99)
Glucose-Capillary: 110 mg/dL — ABNORMAL HIGH (ref 65–99)

## 2018-02-15 MED ORDER — TORSEMIDE 20 MG PO TABS: 40.0000 mg | ORAL_TABLET | Freq: Every day | ORAL | 0 refills | Status: DC

## 2018-02-15 MED ORDER — IPRATROPIUM BROMIDE 0.02 % IN SOLN: 0.5000 mg | Freq: Two times a day (BID) | RESPIRATORY_TRACT | Status: DC

## 2018-02-15 MED ORDER — WARFARIN SODIUM 5 MG PO TABS
5.0000 mg | ORAL_TABLET | Freq: Once | ORAL | Status: DC
  Filled 2018-02-15: qty 1

## 2018-02-15 NOTE — Evaluation (Signed)
Physical Therapy Evaluation Patient Details Name: [name] MRN: [id number] DOB: 15-May-1922 Today's Date: 02/15/2018   History of Present Illness  Pt adm with SOB and found to have acute CHF. PMH - afib, avr, tachybrady syndrome, pacer, AAA repair, chf, pvd  Clinical Impression  Pt admitted with above diagnosis and presents to PT with functional limitations due to deficits listed below (See PT problem list). Pt needs skilled PT to maximize independence and safety to allow discharge to home with niece. Pt was 94% SpO2 on RA and was 92% on RA with ambulation.      Follow Up Recommendations Home health PT;Supervision for mobility/OOB    Equipment Recommendations  None recommended by PT    Recommendations for Other Services       Precautions / Restrictions Precautions Precautions: Fall Restrictions Weight Bearing Restrictions: No      Mobility  Bed Mobility Overal bed mobility: Needs Assistance Bed Mobility: Supine to Sit     Supine to sit: Min assist     General bed mobility comments: assist to elevate trunk into sitting  Transfers Overall transfer level: Needs assistance Equipment used: 4-wheeled walker Transfers: Sit to/from Stand Sit to Stand: Mod assist;Min assist         General transfer comment: Assist to bring hips up. Mod assist from low surface. Min assist from higher surface for balance  Ambulation/Gait Ambulation/Gait assistance: Min assist Ambulation Distance (Feet): 90 Feet Assistive device: 4-wheeled walker Gait Pattern/deviations: Step-through pattern;Decreased step length - right;Decreased step length - left;Trunk flexed     General Gait Details: Assist for balance and safety. Amb on RA with SpO2 >91%.   Stairs            Wheelchair Mobility    Modified Rankin (Stroke Patients Only)       Balance Overall balance assessment: Needs assistance Sitting-balance support: No upper extremity supported;Feet supported Sitting  balance-Leahy Scale: Good     Standing balance support: Bilateral upper extremity supported Standing balance-Leahy Scale: Poor Standing balance comment: rollator and min guard for static standing                             Pertinent Vitals/Pain Pain Assessment: Faces Faces Pain Scale: Hurts little more Pain Location: chronic knee pain Pain Descriptors / Indicators: Grimacing Pain Intervention(s): Limited activity within patient's tolerance;Monitored during session    Home Living Family/patient expects to be discharged to:: Private residence Living Arrangements: Other relatives(niece) Available Help at Discharge: Family;Personal care attendant;Available 24 hours/day Type of Home: House Home Access: Elevator     Home Layout: Two level;Able to live on main level with bedroom/bathroom Home Equipment: Walker - 4 wheels;Walker - 2 wheels;Wheelchair - [employer];Shower seat      Prior Function Level of Independence: Needs assistance   Gait / Transfers Assistance Needed: Pt reports she amb with rollator and assistance  ADL's / Homemaking Assistance Needed: assist for bathing and dressing        Hand Dominance        Extremity/Trunk Assessment   Upper Extremity Assessment Upper Extremity Assessment: Generalized weakness    Lower Extremity Assessment Lower Extremity Assessment: Generalized weakness       Communication   Communication: No difficulties  Cognition Arousal/Alertness: Awake/alert Behavior During Therapy: WFL for tasks assessed/performed Overall Cognitive Status: Within Functional Limits for tasks assessed  General Comments      Exercises     Assessment/Plan    PT Assessment Patient needs continued PT services  PT Problem List Decreased strength;Decreased activity tolerance;Decreased balance;Decreased mobility       PT Treatment Interventions DME instruction;Gait  training;Functional mobility training;Therapeutic activities;Therapeutic exercise;Balance training;Patient/family education    PT Goals (Current goals can be found in the Care Plan section)  Acute Rehab PT Goals Patient Stated Goal: return home PT Goal Formulation: With patient/family Time For Goal Achievement: 02/22/18 Potential to Achieve Goals: Good    Frequency Min 3X/week   Barriers to discharge        Co-evaluation               AM-PAC PT "6 Clicks" Daily Activity  Outcome Measure Difficulty turning over in bed (including adjusting bedclothes, sheets and blankets)?: A Little Difficulty moving from lying on back to sitting on the side of the bed? : Unable Difficulty sitting down on and standing up from a chair with arms (e.g., wheelchair, bedside commode, etc,.)?: Unable Help needed moving to and from a bed to chair (including a wheelchair)?: A Little Help needed walking in hospital room?: A Little Help needed climbing 3-5 steps with a railing? : A Lot 6 Click Score: 13    End of Session Equipment Utilized During Treatment: Gait belt Activity Tolerance: Patient tolerated treatment well Patient left: in chair;with call bell/phone within reach;with chair alarm set Nurse Communication: Mobility status;Other (comment)([employer] out (tech)) PT Visit Diagnosis: Unsteadiness on feet (R26.81);History of falling (Z91.81);Muscle weakness (generalized) (M62.81)    Time: 6606-3016 PT Time Calculation (min) (ACUTE ONLY): 31 min   Charges:   PT Evaluation $PT Eval Moderate Complexity: 1 Mod PT Treatments $Gait Training: 8-22 mins   PT G Codes:        [facility] PT [place] 02/15/2018, 1:02 PM

## 2018-02-15 NOTE — Progress Notes (Signed)
ANTICOAGULATION CONSULT NOTE  Pharmacy Consult:  Coumadin Indication: atrial fibrillation  Allergies  Allergen Reactions  . Advair Diskus [Fluticasone-Salmeterol] Anaphylaxis  . Furosemide Shortness Of Breath  . Other Shortness Of Breath and Other (See Comments)    IODINATED CONTRAST MEDIA: Chills and fever also  . Salmeterol Anaphylaxis    THROAT CLOSES with Advair  . Iohexol Other (See Comments)    Desc:  Pt requires a 13 hr prep, when doing exam with contrast....fever, chills, dyspnea (slg/04/27/09--given 2 hour solumedrol/ benadryl prep--no reaction).    . Sulfonamide Derivatives Nausea And Vomiting  . Verapamil Hives, Nausea And Vomiting and Other (See Comments)    Loss of appetite also   . Adhesive [Tape] Other (See Comments)    TEARS and BRUISES SKIN  . Amoxicillin-Pot Clavulanate Nausea Only and Other (See Comments)    NAUSEA AND STOMACH PAIN  . Contrast Media [Iodinated Diagnostic Agents] Other (See Comments)    "Froze" and fever  . Ibuprofen Other (See Comments)    Has liver damage  . Levofloxacin Nausea Only and Other (See Comments)    NAUSEA AND STOMACH PAIN  . Macrobid [Nitrofurantoin Monohyd Macro] Nausea Only and Other (See Comments)    Severe stomach pain  . Latex Rash  . Penicillins Rash and Other (See Comments)    Has patient had a PCN reaction causing immediate rash, facial/tongue/throat swelling, SOB or lightheadedness with hypotension: Yes Has patient had a PCN reaction causing severe rash involving mucus membranes or skin necrosis: Unk Has patient had a PCN reaction that required hospitalization: Unk Has patient had a PCN reaction occurring within the last 10 years: No If all of the above answers are "NO", then may proceed with Cephalosporin use.     Patient Measurements: Height: 5\' 3"  (160 cm) Weight: 134 lb (60.8 kg)(scale c) IBW/kg (Calculated) : 52.4  Vital Signs: Temp: 97.4 F (36.3 C) (04/01 0405) Temp Source: Oral (04/01 0405) BP: 126/57  (04/01 0405) Pulse Rate: 60 (04/01 0405)  Labs: Recent Labs    02/12/18 1452  02/12/18 2031 02/13/18 0228 02/13/18 0812 02/13/18 1149 02/14/18 0540 02/15/18 0255  HGB 13.3  --   --   --  12.7  --   --   --   HCT 42.8  --   --   --  42.7  --   --   --   PLT 149*  --   --   --  137*  --   --   --   LABPROT 31.0*  --   --   --   --  30.6* 27.1* 21.6*  INR 3.01  --   --   --   --  2.96 2.53 1.89  CREATININE 1.12*  --   --   --  0.99  --  1.06* 1.02*  TROPONINI  --    < > 0.07* 0.06* 0.06*  --   --   --    < > = values in this interval not displayed.    Estimated Creatinine Clearance: 28.5 mL/min (A) (by C-G formula based on SCr of 1.02 mg/dL (H)).   Assessment: 89 YOF with history of Afib on Coumadin presented with shortness of breath.  Pharmacy consulted to continue Coumadin.  INR sub-therapeutic today, likely from holding 02/12/18's dose.  No bleeding reported.   Goal of Therapy:  INR 2-3 Monitor platelets by anticoagulation protocol: Yes    Plan:  Coumadin 5mg  PO today Daily PT / INR   Dacy Enrico  Chelsea Primus, PharmD, BCPS Pager:  [phone] 02/15/2018, 8:09 AM

## 2018-02-15 NOTE — Discharge Instructions (Signed)

## 2018-02-15 NOTE — Progress Notes (Signed)
Patient is active with [facility] as prior to admission for HHRN and PT. B Tiwan Schnitker RN,MHA,BSN [phone] 

## 2018-02-15 NOTE — Progress Notes (Signed)
Pt ready for discharge from unit to home with family. All d/c information reviewed with niece.  Medications, follow up appts, CHF instructions and importance of weighing daily and safeguards to follow of any weight gain.  Niece acknowledges that she understands CHF instructions.

## 2018-02-16 NOTE — Discharge Summary (Signed)
[facility] Discharge Summary   Patient: [name] IEP:[id number]   PCP: Jodi Marble, MD DOB: November 11, 1922   Date of admission: 02/12/2018   Date of discharge: 02/15/2018    Discharge Diagnoses:  Principal Problem:   Acute diastolic (congestive) heart failure (HCC) Active Problems:   CHF (congestive heart failure) ([place])   Hypertension   Dyspnea   Admitted From: home Disposition:  Home with home health  Recommendations for Outpatient Follow-up:  1. Please follow-up with PCP in 1-2 weeks.  [place] Follow up.   Why:  They will continue to do your home health care at your home Contact information: [address] [place] [address] [phone]        Jodi Marble, MD. Go on 02/17/2018.   Specialty:  Internal Medicine Why:  @1 :30pm Contact information: [place] [address] [phone]          Diet recommendation: Cardiac diet  Activity: The patient is advised to use gradually reintroduce usual activities.  Discharge Condition: good  Code Status: full code, DNR before  History of present illness: As per the H and P dictated on admission, " [name]  is a 84 y.o. female, w PMR, CHF (EF 50-55%), moderate MR, Pafib, AAA, apparently c/o dyspnea worse today.  Pt denies fever, chills, cp, palp, orthopnea, pnd, weight gain, lower ext edema. Her pox 80's at home and therefore presented to ED for [facility] Course:  Summary of her active problems in the hospital is as following. #1 acute decompensation of diastolic CHF: Continue telemetry monitoring 2D echocardiogram  shows 60-65% EF no wall motion abnormality. Serial troponins mildly elevated (in the setting of acute kidney injury) Continue diuretics and monitor electrolytes and renal function Cardiology consulted by admitting physician -recommend outpatient follow-up with Dr. Rockey Situ. Patient was switched to  oral torsemide 40 mg daily from 30 mg.  #2 paroxysmal atrial fibrillation: Stable Continue Coumadin  #3 COPD: Continue bronchodilators  #4 acute kidney injury: Mild Creatinine was 0.92 on 01/14/2018, admitted with creatinine of 1.12 Likely due to hemodynamic changes associated with acute CHF Monitor renal function with electrolytes  #5 hyperglycemia: No prior h/o DM  A1c-6% Continue home regimen on discharge.  All other chronic medical condition were stable during the hospitalization.  Patient was seen by physical therapy, who recommended home health, which was arranged by Education officer, museum and case Freight forwarder. On the day of the discharge the patient's vitals were stable , and no other acute medical condition were reported by patient. the patient was felt safe to be discharge at home with home health.  Procedures and Results:  none   Consultations:  none  DISCHARGE MEDICATION: Allergies as of 02/15/2018      Reactions   Advair Diskus [fluticasone-salmeterol] Anaphylaxis   Furosemide Shortness Of Breath   Other Shortness Of Breath, Other (See Comments)   IODINATED CONTRAST MEDIA: Chills and fever also   Salmeterol Anaphylaxis   THROAT CLOSES with Advair   Iohexol Other (See Comments)   Desc:  Pt requires a 13 hr prep, when doing exam with contrast....fever, chills, dyspnea (slg/04/27/09--given 2 hour solumedrol/ benadryl prep--no reaction).     Sulfonamide Derivatives Nausea And Vomiting   Verapamil Hives, Nausea And Vomiting, Other (See Comments)   Loss of appetite also   Adhesive [tape] Other (See Comments)   TEARS and BRUISES SKIN   Amoxicillin-pot Clavulanate Nausea Only, Other (See Comments)  NAUSEA AND STOMACH PAIN   Contrast Media [iodinated Diagnostic Agents] Other (See Comments)   "Froze" and fever   Ibuprofen Other (See Comments)   Has liver damage   Levofloxacin Nausea Only, Other (See Comments)   NAUSEA AND STOMACH PAIN   Macrobid [nitrofurantoin Monohyd  Macro] Nausea Only, Other (See Comments)   Severe stomach pain   Latex Rash   Penicillins Rash, Other (See Comments)   Has patient had a PCN reaction causing immediate rash, facial/tongue/throat swelling, SOB or lightheadedness with hypotension: Yes Has patient had a PCN reaction causing severe rash involving mucus membranes or skin necrosis: Unk Has patient had a PCN reaction that required hospitalization: Unk Has patient had a PCN reaction occurring within the last 10 years: No If all of the above answers are "NO", then may proceed with Cephalosporin use.      Medication List    TAKE these medications   acetaminophen 500 MG tablet Commonly known as:  TYLENOL Take 1 tablet (500 mg total) by mouth every 6 (six) hours. What changed:    when to take this  reasons to take this   albuterol 108 (90 Base) MCG/ACT inhaler Commonly known as:  PROVENTIL HFA;VENTOLIN HFA Inhale 1 puff into the lungs every 6 (six) hours as needed for wheezing or shortness of breath. What changed:  Another medication with the same name was changed. Make sure you understand how and when to take each.   albuterol (2.5 MG/3ML) 0.083% nebulizer solution Commonly known as:  PROVENTIL Inhale 1 vial via nebulizer every 6 hours as needed .J44.9 What changed:    how much to take  how to take this  when to take this  additional instructions   AMBULATORY NON FORMULARY MEDICATION Medication Name: AeroChamber DME:[facility] DX:J98.11   bacitracin ointment Apply 1 application topically 2 (two) times daily. Apply to skin tears, then place a non-stick dressing   budesonide-formoterol 160-4.5 MCG/ACT inhaler Commonly known as:  SYMBICORT Inhale 1 puff into the lungs 2 (two) times daily.   diclofenac sodium 1 % Gel Commonly known as:  VOLTAREN Apply 4 g topically See admin instructions. Apply 4 grams to both knees two times a day   flintstones complete 60 MG chewable tablet Chew 1 tablet by mouth daily.     ipratropium 0.02 % nebulizer solution Commonly known as:  ATROVENT Take 2.5 mLs (0.5 mg total) by nebulization 4 (four) times daily. As needed J44.9 What changed:    when to take this  additional instructions   latanoprost 0.005 % ophthalmic solution Commonly known as:  XALATAN Place 1 drop into both eyes at bedtime.   LORazepam 0.5 MG tablet Commonly known as:  ATIVAN Take 1 tablet (0.5 mg total) by mouth every 8 (eight) hours as needed for anxiety. What changed:  when to take this   meclizine 25 MG tablet Commonly known as:  ANTIVERT Take 12.5 mg by mouth 3 (three) times daily as needed for dizziness.   oxyCODONE 5 MG immediate release tablet Commonly known as:  ROXICODONE Take 0.5 tablets (2.5 mg total) by mouth every 6 (six) hours as needed for severe pain.   pantoprazole 40 MG tablet Commonly known as:  PROTONIX Take 40 mg by mouth daily.   PARoxetine 10 MG tablet Commonly known as:  PAXIL Take 10 mg by mouth daily.   polyethylene glycol packet Commonly known as:  MIRALAX / GLYCOLAX Take 17 g by mouth daily as needed for moderate constipation. What changed:  when  to take this   potassium chloride SA 20 MEQ tablet Commonly known as:  K-DUR,KLOR-CON TAKE ONE TABLET BY MOUTH EACH EVENING What changed:  See the new instructions.   predniSONE 5 MG tablet Commonly known as:  DELTASONE Take 7.5 mg by mouth daily.   SIMBRINZA 1-0.2 % Susp Generic drug:  Brinzolamide-Brimonidine Place 1 drop into both eyes daily.   torsemide 20 MG tablet Commonly known as:  DEMADEX Take 2 tablets (40 mg total) by mouth daily. What changed:  how much to take   traMADol 50 MG tablet Commonly known as:  ULTRAM Take 1 tablet (50 mg total) by mouth every 6 (six) hours as needed for moderate pain. What changed:    how much to take  when to take this   traZODone 50 MG tablet Commonly known as:  DESYREL Take 75 mg by mouth at bedtime.   warfarin 5 MG tablet Commonly known  as:  COUMADIN Take as directed. If you are unsure how to take this medication, talk to your nurse or doctor. Original instructions:  TAKE AS DIRECTED BY THE COUMADIN CLINIC What changed:    how much to take  how to take this  when to take this  additional instructions      Allergies  Allergen Reactions  . Advair Diskus [Fluticasone-Salmeterol] Anaphylaxis  . Furosemide Shortness Of Breath  . Other Shortness Of Breath and Other (See Comments)    IODINATED CONTRAST MEDIA: Chills and fever also  . Salmeterol Anaphylaxis    THROAT CLOSES with Advair  . Iohexol Other (See Comments)    Desc:  Pt requires a 13 hr prep, when doing exam with contrast....fever, chills, dyspnea (slg/04/27/09--given 2 hour solumedrol/ benadryl prep--no reaction).    . Sulfonamide Derivatives Nausea And Vomiting  . Verapamil Hives, Nausea And Vomiting and Other (See Comments)    Loss of appetite also   . Adhesive [Tape] Other (See Comments)    TEARS and BRUISES SKIN  . Amoxicillin-Pot Clavulanate Nausea Only and Other (See Comments)    NAUSEA AND STOMACH PAIN  . Contrast Media [Iodinated Diagnostic Agents] Other (See Comments)    "Froze" and fever  . Ibuprofen Other (See Comments)    Has liver damage  . Levofloxacin Nausea Only and Other (See Comments)    NAUSEA AND STOMACH PAIN  . Macrobid [Nitrofurantoin Monohyd Macro] Nausea Only and Other (See Comments)    Severe stomach pain  . Latex Rash  . Penicillins Rash and Other (See Comments)    Has patient had a PCN reaction causing immediate rash, facial/tongue/throat swelling, SOB or lightheadedness with hypotension: Yes Has patient had a PCN reaction causing severe rash involving mucus membranes or skin necrosis: Unk Has patient had a PCN reaction that required hospitalization: Unk Has patient had a PCN reaction occurring within the last 10 years: No If all of the above answers are "NO", then may proceed with Cephalosporin use.    Discharge  Instructions    (HEART FAILURE PATIENTS) Call MD:  Anytime you have any of the following symptoms: 1) 3 pound weight gain in 24 hours or 5 pounds in 1 week 2) shortness of breath, with or without a dry hacking cough 3) swelling in the hands, feet or stomach 4) if you have to sleep on extra pillows at night in order to breathe.   Complete by:  As directed    Diet - low sodium heart healthy   Complete by:  As directed    Increase  activity slowly   Complete by:  As directed      Discharge Exam: Filed Weights   02/13/18 0559 02/14/18 0617 02/15/18 0405  Weight: 61.3 kg (135 lb 3.2 oz) 60.5 kg (133 lb 4.8 oz) 60.8 kg (134 lb)   Vitals:   02/15/18 0405 02/15/18 0832  BP: (!) 126/57   Pulse: 60   Resp: 20   Temp: (!) 97.4 F (36.3 C)   SpO2: 100% 97%   General: Appear in no distress, no Rash; Oral Mucosa moist. Cardiovascular: S1 and S2 Present, no Murmur, no JVD Respiratory: Bilateral Air entry present and Clear to Auscultation, no Crackles, no wheezes Abdomen: Bowel Sound present, Soft and no tenderness Extremities: no Pedal edema, no calf tenderness Neurology: Grossly no focal neuro deficit.  The results of significant diagnostics from this hospitalization (including imaging, microbiology, ancillary and laboratory) are listed below for reference.    Significant Diagnostic Studies: Dg Chest 2 View  Result Date: 02/12/2018 CLINICAL DATA:  Shortness of breath.  Cough. EXAM: CHEST - 2 VIEW COMPARISON:  Chest x-ray 02/09/2018. FINDINGS: Thoracic aortic prominence again noted consistent with known thoracic aortic aneurysm. No change from prior exam cardiac pacer with lead tip over the right ventricle. Prior CABG. Prior cardiac valve replacement. Bilateral mild interstitial prominence and bilateral pleural effusions. Findings suggest mild CHF. Mild basilar atelectasis again noted. IMPRESSION: 1. Cardiac pacer stable position. Cardiac valve replacement. Cardiomegaly with mild increase in  bilateral interstitial prominence and small bilateral pleural effusions again noted. Findings consistent with CHF. Persistent mild bibasilar atelectasis. 2.  Thoracic aortic aneurysm again noted.  No interim change. Electronically Signed   By: Marcello Moores  Register   On: 02/12/2018 15:15   Dg Chest 2 View  Result Date: 02/10/2018 CLINICAL DATA:  Shortness of breath, chest pain. EXAM: CHEST - 2 VIEW COMPARISON:  Radiograph of Apr 15, 2017. FINDINGS: Stable cardiomegaly. Aneurysmal dilatation of descending thoracic aorta is noted. Atherosclerosis of abdominal aorta. Stable position of left-sided pacemaker. Status post aortic valve replacement. No pneumothorax is noted. Mild bibasilar subsegmental atelectasis is noted. Mild right pleural effusion is noted. IMPRESSION: Mild bibasilar subsegmental atelectasis. Mild right pleural effusion. Thoracic aortic aneurysm is noted. CTA of the chest is recommended for further evaluation. Aortic Atherosclerosis (ICD10-I70.0). Electronically Signed   By: Marijo Conception, M.D.   On: 02/10/2018 08:48    Microbiology: No results found for this or any previous visit (from the past 240 hour(s)).   Labs: CBC: Recent Labs  Lab 02/12/18 1452 02/13/18 0812  WBC 6.1 4.2  HGB 13.3 12.7  HCT 42.8 42.7  MCV 94.9 94.9  PLT 149* 998*   Basic Metabolic Panel: Recent Labs  Lab 02/12/18 1452 02/13/18 0812 02/14/18 0540 02/15/18 0255  NA 137 142 140 140  K 4.4 2.9* 3.1* 3.8  CL 96* 96* 95* 95*  CO2 28 32 34* 36*  GLUCOSE 205* 99 109* 129*  BUN 26* 17 16 17   CREATININE 1.12* 0.99 1.06* 1.02*  CALCIUM 8.9 8.4* 8.5* 8.8*   Liver Function Tests: Recent Labs  Lab 02/13/18 0812  AST 24  ALT 16  ALKPHOS 67  BILITOT 1.8*  PROT 5.8*  ALBUMIN 3.5   No results for input(s): LIPASE, AMYLASE in the last 168 hours. No results for input(s): AMMONIA in the last 168 hours. Cardiac Enzymes: Recent Labs  Lab 02/12/18 1849 02/12/18 2031 02/13/18 0228 02/13/18 0812    TROPONINI 0.06* 0.07* 0.06* 0.06*   BNP (last 3 results) Recent Labs  12/16/17 1544 02/12/18 1449 02/14/18 0540  BNP 121.0* 137.6* 178.6*   CBG: Recent Labs  Lab 02/14/18 1126 02/14/18 1621 02/14/18 2122 02/15/18 0739 02/15/18 1127  GLUCAP 221* 119* 218* 110* 261*   Time spent: 35 minutes  Signed:  Berle Mull  [facility] 02/15/2018, 6:46 PM

## 2018-02-18 ENCOUNTER — Telehealth: Payer: Self-pay | Admitting: Internal Medicine

## 2018-02-18 DIAGNOSIS — J449 Chronic obstructive pulmonary disease, unspecified: Secondary | ICD-10-CM

## 2018-02-18 NOTE — Telephone Encounter (Signed)
Pt will need ONO and 6 minute walk test to see if she needs/qualifies for oxygen.

## 2018-02-18 NOTE — Telephone Encounter (Signed)
Hopefully the ONO should be enough to qualify her.

## 2018-02-18 NOTE — Telephone Encounter (Signed)
[facility]u [facility] calling stating since patient has been home from hospital she is still having low Oxygen readings She states patient is only on room air and has never had oxygen but thinks it may be needed Pt complained this morning to niece that she was not able to sleep for she could not breath properly.  Her O2 was 83 and when Stacy saw patient she said her O2 was about 75 She did get it to 90, she stood patient up and asked her to take a few steps. She did and then patient had to sit down, for she felt her legs were going to give out.  Would like advise on maybe patient needing in home oxygen  Please call back

## 2018-02-18 NOTE — Telephone Encounter (Signed)
ONO will has ordered PT Jobe Gibbon with [facility] states patient is not able to walk due to her arthritis/age. Patient's niece has been made aware as well to expect a call from [facility].

## 2018-02-19 ENCOUNTER — Telehealth: Payer: Self-pay | Admitting: Cardiovascular Disease

## 2018-02-19 NOTE — Telephone Encounter (Signed)
Lmov for patient to call back and schedule appt °

## 2018-02-19 NOTE — Telephone Encounter (Signed)
-----   Message from Minna Merritts, MD sent at 02/19/2018 11:04 AM EDT ----- Regarding: hosp f/u Recently in the hospital for CHF, shortness of breath in [place] Can we call to arrange follow-up in clinic with me or APP TG

## 2018-02-19 NOTE — Progress Notes (Deleted)
Cardiology Office Note  Date:  02/19/2018   ID:  [name], DOB 09-11-1922, MRN [id number]  PCP:  Jodi Marble, MD   No chief complaint on file.   HPI:  84 yo WF with history of  tachy-brady syndrome s/p pacemaker,  Permanent atrial fibrillation on warfarin,  HTN,  bioprosthetic AVR seen on echo June 2017,  AAA with repair 4-5 years ago,  chronic diastolic heart failure,   right brachial arterial stenosis,  TIA in 2012,   No significant coronary artery disease or carotid arterial disease Echocardiogram in 2012 showing moderate MR and TR with moderate pulmonary hypertension  significant peripheral vascular disease, Thoracic aortic aneurysm followed by Dr. Donnetta Hutching.  Treated with chronic prednisone for polymyalgia Chronic nausea presenting for routine followup of her chronic diastolic CHF, aneurysm and atrial fibrillation  Last seen in clinic January 2019 Presents today after recent discharge from the hospital for shortness of breath, acute on chronic diastolic CHF, pulse ox 12% at home At discharge torsemide dosing increased from 30 up to 40 mg daily  No recent hospitalizations Recent medical issues managed by primary care Reports having chest x-ray with small pleural effusion Took extra torsemide Was having mild shortness of breath Takes torsemide 20 mg daily, occasionally takes 1.5 pills for extra leg swelling or shortness of breath  Difficulty with urinary tract infections, completed course of antibiotics, repeat culture done through primary care, results not available  Denies having any tachycardia or palpitations   Previous CT scan and records reviewed from Dr. Donnetta Hutching Notes indicating to not pursue aggressive course with her aortic aneurysms Nonsurgical candidate  Previously in the hospital June 2018 for hemoptysis, shortness of breath CT scan showing multifocal pneumonia, possible aspiration, started on antibiotics, BIPAP Possible necrosis  Tolerating  warfarin  EKG personally reviewed by myself on todays visit Shows atrial fibrillation rate 76 bpm, ventricularly paced rhythm  S/P AAA repair/distal aortic arch aneurysm Continue blood pressure control. 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 The mid descending thoracic aorta measures 4.9 cm and is stable. Distal descending thoracic aorta measures 4.3 cm and is stable.   Other past medical history reviewed CT 05/2016 Thoracic aortic aneurysm, measuring 5.3 cm at the level of the proximal descending thoracic aorta, previously 4.5 cm  Echocardiogram April 2016 with significant pulmonary hypertension.  pneumonia in October 2015.   chronic knee pain Some shortness of breath but is not very active at baseline  History of severe COPD exacerbation, bronchitis. She was on Z-Pak, prednisone, started on albuterol inhaler She took several days of diuretics, torsemide,  with mild weight loss now down to 140 pounds. No significant edema. Symptoms improved  Previous Evaluation of her pacemaker report shows she is 60% RV paced  admitted to the hospital at [facility] on Apr 16 2011 with shortness of breath, acute on chronic diastolic heart failure, underlying hypertension.  possible  pneumonia. White blood cell count was 13. Antibiotics were discontinued after her chest x-ray cleared.  Numerous skin cancers over the past several years, on her legs, face, arms requiring resection History of arthritis in her knees requiring cortisone shot  cardiac catheterization over 5 years ago showing no significant coronary artery disease, recent CT scan showing stable descending aorta and after repair, minimal dilatation of the descending aorta, stable-appearing aortic valve.   PMH:   has a past medical history of AAA (abdominal aortic aneurysm) ([place]), Anxiety, Aortic insufficiency, Brain aneurysm, Chest pain, Chronic diastolic CHF (congestive heart failure) ([place]), Diverticulosis, Gastritis,  Hemorrhoids, Hepatitis B  (1980's), Hiatal hernia, Hypertension, Moderate mitral regurgitation, Moderate tricuspid regurgitation, Permanent atrial fibrillation (HCC), Polymyalgia rheumatica (HCC), Pulmonary hypertension (HCC), S/P aortic valve replacement with bioprosthetic valve, S/P placement of cardiac pacemaker, Seizures ([place]), Shingles, Skin cancer, Tachy-brady syndrome ([place]), and Thoracic aneurysm.  PSH:    Past Surgical History:  Procedure Laterality Date  . ABDOMINAL AORTIC ANEURYSM REPAIR     Resection  . AORTIC VALVE REPLACEMENT     With bioprosthetic valve  . APPENDECTOMY    . BREAST BIOPSY    . CEREBRAL ANEURYSM REPAIR    . CHOLECYSTECTOMY    . EP IMPLANTABLE DEVICE N/A 08/20/2016   Procedure: PPM Generator Changeout;  Surgeon: Deboraha Sprang, MD;  Location: [facility];  Service: Cardiovascular;  Laterality: N/A;  . LUNG BIOPSY  2005  . PACEMAKER PLACEMENT  08/2007   [employer]; AF permanent; s/p pacemaker for bradycardia with now well controlled ventricular response; Digoxin therapy, question level; Lower extremity venous issues  . skin cancer removal     ear, face and hand  . VESICOVAGINAL FISTULA CLOSURE W/ TAH      Current Outpatient Medications  Medication Sig Dispense Refill  . acetaminophen (TYLENOL) 500 MG tablet Take 1 tablet (500 mg total) by mouth every 6 (six) hours. (Patient taking differently: Take 500 mg by mouth every 6 (six) hours as needed (for pain or headaches). ) 30 tablet 0  . albuterol (PROVENTIL HFA;VENTOLIN HFA) 108 (90 Base) MCG/ACT inhaler Inhale 1 puff into the lungs every 6 (six) hours as needed for wheezing or shortness of breath.    [name] albuterol (PROVENTIL) (2.5 MG/3ML) 0.083% nebulizer solution Inhale 1 vial via nebulizer every 6 hours as needed .J44.9 (Patient taking differently: Take 2.5 mg by nebulization 3 (three) times daily. ) 1080 mL 11  . AMBULATORY NON FORMULARY MEDICATION Medication Name: AeroChamber DME:[facility] DX:J98.11 1 each 0  . bacitracin ointment  Apply 1 application topically 2 (two) times daily. Apply to skin tears, then place a non-stick dressing 120 g 0  . Brinzolamide-Brimonidine (SIMBRINZA) 1-0.2 % SUSP Place 1 drop into both eyes daily.     . budesonide-formoterol (SYMBICORT) 160-4.5 MCG/ACT inhaler Inhale 1 puff into the lungs 2 (two) times daily.    . diclofenac sodium (VOLTAREN) 1 % GEL Apply 4 g topically See admin instructions. Apply 4 grams to both knees two times a day    . flintstones complete (FLINTSTONES) 60 MG chewable tablet Chew 1 tablet by mouth daily.    [name] ipratropium (ATROVENT) 0.02 % nebulizer solution Take 2.5 mLs (0.5 mg total) by nebulization 4 (four) times daily. As needed J44.9 (Patient taking differently: Take 0.5 mg by nebulization 3 (three) times daily. ) 1080 mL 11  . latanoprost (XALATAN) 0.005 % ophthalmic solution Place 1 drop into both eyes at bedtime.    [name] LORazepam (ATIVAN) 0.5 MG tablet Take 1 tablet (0.5 mg total) by mouth every 8 (eight) hours as needed for anxiety. (Patient taking differently: Take 0.5 mg by mouth 3 (three) times daily. ) 12 tablet 0  . meclizine (ANTIVERT) 25 MG tablet Take 12.5 mg by mouth 3 (three) times daily as needed for dizziness.    [name] oxyCODONE (ROXICODONE) 5 MG immediate release tablet Take 0.5 tablets (2.5 mg total) by mouth every 6 (six) hours as needed for severe pain. (Patient not taking: Reported on 02/12/2018) 10 tablet 0  . pantoprazole (PROTONIX) 40 MG tablet Take 40 mg by mouth daily.     [name]  PARoxetine (PAXIL) 10 MG tablet Take 10 mg by mouth daily.  1  . polyethylene glycol (MIRALAX / GLYCOLAX) packet Take 17 g by mouth daily as needed for moderate constipation. (Patient taking differently: Take 17 g by mouth daily. ) 14 each 0  . potassium chloride SA (K-DUR,KLOR-CON) 20 MEQ tablet TAKE ONE TABLET BY MOUTH EACH EVENING (Patient taking differently: Take 20 mEq by mouth in the evening) 90 tablet 3  . predniSONE (DELTASONE) 5 MG tablet Take 7.5 mg by mouth daily.  0  .  torsemide (DEMADEX) 20 MG tablet Take 2 tablets (40 mg total) by mouth daily. 60 tablet 0  . traMADol (ULTRAM) 50 MG tablet Take 1 tablet (50 mg total) by mouth every 6 (six) hours as needed for moderate pain. (Patient taking differently: Take 25 mg by mouth at bedtime. ) 30 tablet 0  . traZODone (DESYREL) 50 MG tablet Take 75 mg by mouth at bedtime.     [name] warfarin (COUMADIN) 5 MG tablet TAKE AS DIRECTED BY THE COUMADIN CLINIC (Patient taking differently: Take 2.5-5 mg by mouth See admin instructions. Take 2.5 mg by mouth in the evening at 6 PM on Sun/Mon/Wed/Thurs/Fri and 5 mg on Tues/Sat) 30 tablet 3   No current facility-administered medications for this visit.      Allergies:   Advair diskus [fluticasone-salmeterol]; Furosemide; Other; Salmeterol; Iohexol; Sulfonamide derivatives; Verapamil; Adhesive [tape]; Amoxicillin-pot clavulanate; Contrast media [iodinated diagnostic agents]; Ibuprofen; Levofloxacin; Macrobid [nitrofurantoin monohyd macro]; Latex; and Penicillins   Social History:  The patient  reports that she has never smoked. She has never used smokeless tobacco. She reports that she does not drink alcohol or use drugs.   Family History:   family history includes AAA (abdominal aortic aneurysm) in her sister; Cancer in her mother; Heart disease in her brother, father, mother, and sister; Hypertension in her brother, father, mother, and sister; Varicose Veins in her mother and sister.    Review of Systems: Review of Systems  Respiratory: Negative.   Cardiovascular: Negative.   Gastrointestinal: Negative.   Musculoskeletal: Negative.   Neurological: Positive for weakness.  Psychiatric/Behavioral: Negative.   All other systems reviewed and are negative.    PHYSICAL EXAM: VS:  There were no vitals taken for this visit. , BMI There is no height or weight on file to calculate BMI. GEN: Well nourished, well developed, in no acute distress, presenting a wheelchair  HEENT: normal   Neck: no JVD, carotid bruits, or masses Cardiac: RRR; no murmurs, rubs, or gallops,no edema  Respiratory:  clear to auscultation bilaterally, normal work of breathing GI: soft, nontender, nondistended, + BS MS: no deformity or atrophy  Skin: warm and dry, no rash Neuro:  Strength and sensation are intact Psych: euthymic mood, full affect    Recent Labs: 02/13/2018: ALT 16; Hemoglobin 12.7; Platelets 137 02/14/2018: B Natriuretic Peptide 178.6 02/15/2018: BUN 17; Creatinine, Ser 1.02; Potassium 3.8; Sodium 140      Wt Readings from Last 3 Encounters:  02/15/18 134 lb (60.8 kg)  02/09/18 137 lb (62.1 kg)  01/13/18 133 lb (60.3 kg)       ASSESSMENT AND PLAN:  Atrial fibrillation, unspecified type ([place]) - Plan: EKG 12-Lead Ventricular rate well controlled, tolerating anticoagulation No recent falls  Thoracic aortic aneurysm without rupture ([place]) Not a surgical candidate No further imaging at this time  Leg swelling Minimal swelling, recommended compression hose for swelling, leg elevation  DIASTOLIC HEART FAILURE, CHRONIC continue torsemide 20 up to 30 mg daily  Appears relatively euvolemic on today's visit Previous chest x-ray with small pleural effusion, patient reports repeat chest x-ray with improvement  S/P AAA repair  seen on recent CT scan Not a candidate for aortic aneurysm surgery  Nausea Previously chronic issue Managed by primary care  AVR, bioprosthetic echocardiogram June 2017 Functioning well, well-seated  Weakness  Continues to be weak, stable, presenting in a wheelchair   Total encounter time more than 25 minutes  Greater than 50% was spent in counseling and coordination of care with the patient    Disposition:   F/U  6 months   No orders of the defined types were placed in this encounter.    Signed, Esmond Plants, M.D., Ph.D. 02/19/2018  [place], [place] [phone]\

## 2018-02-19 NOTE — Telephone Encounter (Signed)
Pt scheduled for 02/22/18

## 2018-02-20 ENCOUNTER — Inpatient Hospital Stay (HOSPITAL_COMMUNITY)
Admission: EM | Admit: 2018-02-20 | Discharge: 2018-02-26 | DRG: 291 | Disposition: A | Discharge status: Home Health | Payer: Medicare Other | Attending: Family Medicine | Admitting: Family Medicine

## 2018-02-20 ENCOUNTER — Other Ambulatory Visit: Payer: Self-pay

## 2018-02-20 ENCOUNTER — Encounter (HOSPITAL_COMMUNITY): Payer: Self-pay

## 2018-02-20 ENCOUNTER — Emergency Department (HOSPITAL_COMMUNITY): Payer: Medicare Other

## 2018-02-20 DIAGNOSIS — Z881 Allergy status to other antibiotic agents status: Secondary | ICD-10-CM

## 2018-02-20 DIAGNOSIS — I272 Pulmonary hypertension, unspecified: Secondary | ICD-10-CM | POA: Diagnosis present

## 2018-02-20 DIAGNOSIS — Z882 Allergy status to sulfonamides status: Secondary | ICD-10-CM

## 2018-02-20 DIAGNOSIS — E876 Hypokalemia: Secondary | ICD-10-CM | POA: Diagnosis present

## 2018-02-20 DIAGNOSIS — Z8249 Family history of ischemic heart disease and other diseases of the circulatory system: Secondary | ICD-10-CM | POA: Diagnosis not present

## 2018-02-20 DIAGNOSIS — I482 Chronic atrial fibrillation: Secondary | ICD-10-CM | POA: Diagnosis present

## 2018-02-20 DIAGNOSIS — R791 Abnormal coagulation profile: Secondary | ICD-10-CM | POA: Diagnosis present

## 2018-02-20 DIAGNOSIS — Z953 Presence of xenogenic heart valve: Secondary | ICD-10-CM | POA: Diagnosis not present

## 2018-02-20 DIAGNOSIS — M353 Polymyalgia rheumatica: Secondary | ICD-10-CM | POA: Diagnosis present

## 2018-02-20 DIAGNOSIS — T380X5A Adverse effect of glucocorticoids and synthetic analogues, initial encounter: Secondary | ICD-10-CM | POA: Diagnosis present

## 2018-02-20 DIAGNOSIS — R739 Hyperglycemia, unspecified: Secondary | ICD-10-CM

## 2018-02-20 DIAGNOSIS — J9601 Acute respiratory failure with hypoxia: Secondary | ICD-10-CM | POA: Diagnosis not present

## 2018-02-20 DIAGNOSIS — Z950 Presence of cardiac pacemaker: Secondary | ICD-10-CM

## 2018-02-20 DIAGNOSIS — I5032 Chronic diastolic (congestive) heart failure: Secondary | ICD-10-CM

## 2018-02-20 DIAGNOSIS — Z7951 Long term (current) use of inhaled steroids: Secondary | ICD-10-CM

## 2018-02-20 DIAGNOSIS — D696 Thrombocytopenia, unspecified: Secondary | ICD-10-CM | POA: Diagnosis present

## 2018-02-20 DIAGNOSIS — Z79899 Other long term (current) drug therapy: Secondary | ICD-10-CM

## 2018-02-20 DIAGNOSIS — Z85828 Personal history of other malignant neoplasm of skin: Secondary | ICD-10-CM

## 2018-02-20 DIAGNOSIS — Z91040 Latex allergy status: Secondary | ICD-10-CM

## 2018-02-20 DIAGNOSIS — Z951 Presence of aortocoronary bypass graft: Secondary | ICD-10-CM | POA: Diagnosis not present

## 2018-02-20 DIAGNOSIS — Z66 Do not resuscitate: Secondary | ICD-10-CM | POA: Diagnosis present

## 2018-02-20 DIAGNOSIS — Z7952 Long term (current) use of systemic steroids: Secondary | ICD-10-CM

## 2018-02-20 DIAGNOSIS — I10 Essential (primary) hypertension: Secondary | ICD-10-CM

## 2018-02-20 DIAGNOSIS — J441 Chronic obstructive pulmonary disease with (acute) exacerbation: Secondary | ICD-10-CM | POA: Diagnosis present

## 2018-02-20 DIAGNOSIS — F319 Bipolar disorder, unspecified: Secondary | ICD-10-CM | POA: Diagnosis present

## 2018-02-20 DIAGNOSIS — I248 Other forms of acute ischemic heart disease: Secondary | ICD-10-CM | POA: Diagnosis present

## 2018-02-20 DIAGNOSIS — Z7901 Long term (current) use of anticoagulants: Secondary | ICD-10-CM

## 2018-02-20 DIAGNOSIS — K429 Umbilical hernia without obstruction or gangrene: Secondary | ICD-10-CM | POA: Diagnosis present

## 2018-02-20 DIAGNOSIS — E873 Alkalosis: Secondary | ICD-10-CM | POA: Diagnosis present

## 2018-02-20 DIAGNOSIS — J208 Acute bronchitis due to other specified organisms: Secondary | ICD-10-CM | POA: Diagnosis present

## 2018-02-20 DIAGNOSIS — Z880 Allergy status to penicillin: Secondary | ICD-10-CM | POA: Diagnosis not present

## 2018-02-20 DIAGNOSIS — Z888 Allergy status to other drugs, medicaments and biological substances status: Secondary | ICD-10-CM

## 2018-02-20 DIAGNOSIS — I110 Hypertensive heart disease with heart failure: Principal | ICD-10-CM | POA: Diagnosis present

## 2018-02-20 DIAGNOSIS — I5033 Acute on chronic diastolic (congestive) heart failure: Secondary | ICD-10-CM | POA: Diagnosis present

## 2018-02-20 DIAGNOSIS — J9621 Acute and chronic respiratory failure with hypoxia: Secondary | ICD-10-CM | POA: Diagnosis present

## 2018-02-20 DIAGNOSIS — Z91041 Radiographic dye allergy status: Secondary | ICD-10-CM

## 2018-02-20 DIAGNOSIS — R0602 Shortness of breath: Secondary | ICD-10-CM | POA: Diagnosis not present

## 2018-02-20 DIAGNOSIS — Z8673 Personal history of transient ischemic attack (TIA), and cerebral infarction without residual deficits: Secondary | ICD-10-CM

## 2018-02-20 DIAGNOSIS — R748 Abnormal levels of other serum enzymes: Secondary | ICD-10-CM | POA: Diagnosis not present

## 2018-02-20 LAB — CBC WITH DIFFERENTIAL/PLATELET
Basophils Absolute: 0 10*3/uL (ref 0.0–0.1)
Basophils Relative: 0 %
Eosinophils Absolute: 0 10*3/uL (ref 0.0–0.7)
Eosinophils Relative: 0 %
HCT: 39.9 % (ref 36.0–46.0)
Hemoglobin: 12.3 g/dL (ref 12.0–15.0)
Lymphocytes Relative: 9 %
Lymphs Abs: 0.4 10*3/uL — ABNORMAL LOW (ref 0.7–4.0)
MCH: 28.6 pg (ref 26.0–34.0)
MCHC: 30.8 g/dL (ref 30.0–36.0)
MCV: 92.8 fL (ref 78.0–100.0)
Monocytes Absolute: 0.4 10*3/uL (ref 0.1–1.0)
Monocytes Relative: 10 %
Neutro Abs: 3.5 10*3/uL (ref 1.7–7.7)
Neutrophils Relative %: 81 %
Platelets: 129 10*3/uL — ABNORMAL LOW (ref 150–400)
RBC: 4.3 MIL/uL (ref 3.87–5.11)
RDW: 15.2 % (ref 11.5–15.5)
WBC: 4.3 10*3/uL (ref 4.0–10.5)

## 2018-02-20 LAB — MAGNESIUM: MAGNESIUM: 1.9 mg/dL (ref 1.7–2.4)

## 2018-02-20 LAB — BASIC METABOLIC PANEL
Anion gap: 15 (ref 5–15)
BUN: 23 mg/dL — ABNORMAL HIGH (ref 6–20)
CO2: 28 mmol/L (ref 22–32)
Calcium: 8.9 mg/dL (ref 8.9–10.3)
Chloride: 94 mmol/L — ABNORMAL LOW (ref 101–111)
Creatinine, Ser: 1.15 mg/dL — ABNORMAL HIGH (ref 0.44–1.00)
GFR calc Af Amer: 46 mL/min — ABNORMAL LOW (ref >60)
GFR calc non Af Amer: 40 mL/min — ABNORMAL LOW (ref >60)
Glucose, Bld: 185 mg/dL — ABNORMAL HIGH (ref 65–99)
Potassium: 3.4 mmol/L — ABNORMAL LOW (ref 3.5–5.1)
Sodium: 137 mmol/L (ref 135–145)

## 2018-02-20 LAB — GLUCOSE, CAPILLARY: GLUCOSE-CAPILLARY: 260 mg/dL — AB (ref 65–99)

## 2018-02-20 LAB — BRAIN NATRIURETIC PEPTIDE: B Natriuretic Peptide: 230 pg/mL — ABNORMAL HIGH (ref 0.0–100.0)

## 2018-02-20 LAB — PROTIME-INR
INR: 3.32
Prothrombin Time: 33.4 seconds — ABNORMAL HIGH (ref 11.4–15.2)

## 2018-02-20 LAB — TROPONIN I
Troponin I: 0.09 ng/mL (ref <0.03)
Troponin I: 0.08 ng/mL (ref <0.03)

## 2018-02-20 MED ORDER — METHYLPREDNISOLONE SODIUM SUCC 125 MG IJ SOLR
60.0000 mg | Freq: Two times a day (BID) | INTRAMUSCULAR | Status: DC
  Administered 2018-02-21 – 2018-02-22 (×3): 60 mg via INTRAVENOUS
  Filled 2018-02-20 (×3): qty 2

## 2018-02-20 MED ORDER — POTASSIUM CHLORIDE CRYS ER 20 MEQ PO TBCR
40.0000 meq | EXTENDED_RELEASE_TABLET | Freq: Two times a day (BID) | ORAL | Status: AC
  Administered 2018-02-21 (×2): 40 meq via ORAL
  Filled 2018-02-20 (×2): qty 2

## 2018-02-20 MED ORDER — METHYLPREDNISOLONE SODIUM SUCC 125 MG IJ SOLR
125.0000 mg | Freq: Once | INTRAMUSCULAR | Status: AC
  Administered 2018-02-20: 125 mg via INTRAVENOUS
  Filled 2018-02-20: qty 2

## 2018-02-20 MED ORDER — MOMETASONE FURO-FORMOTEROL FUM 200-5 MCG/ACT IN AERO
2.0000 | INHALATION_SPRAY | Freq: Two times a day (BID) | RESPIRATORY_TRACT | Status: DC
  Administered 2018-02-21 – 2018-02-26 (×11): 2 via RESPIRATORY_TRACT
  Filled 2018-02-20: qty 8.8

## 2018-02-20 MED ORDER — LORAZEPAM 1 MG PO TABS
0.5000 mg | ORAL_TABLET | Freq: Three times a day (TID) | ORAL | Status: DC
  Administered 2018-02-21 – 2018-02-23 (×8): 0.5 mg via ORAL
  Administered 2018-02-24: 16:00:00 via ORAL
  Administered 2018-02-24 – 2018-02-26 (×7): 0.5 mg via ORAL
  Filled 2018-02-20 (×16): qty 1

## 2018-02-20 MED ORDER — AZITHROMYCIN 250 MG PO TABS
500.0000 mg | ORAL_TABLET | Freq: Once | ORAL | Status: AC
  Administered 2018-02-20: 500 mg via ORAL
  Filled 2018-02-20: qty 2

## 2018-02-20 MED ORDER — FUROSEMIDE 10 MG/ML IJ SOLN
40.0000 mg | Freq: Once | INTRAMUSCULAR | Status: DC
  Filled 2018-02-20: qty 4

## 2018-02-20 MED ORDER — TRAMADOL HCL 50 MG PO TABS
25.0000 mg | ORAL_TABLET | Freq: Every day | ORAL | Status: DC
  Administered 2018-02-22 – 2018-02-25 (×4): 25 mg via ORAL
  Filled 2018-02-20 (×5): qty 1

## 2018-02-20 MED ORDER — LATANOPROST 0.005 % OP SOLN
1.0000 [drp] | Freq: Every day | OPHTHALMIC | Status: DC
  Administered 2018-02-21 – 2018-02-25 (×6): 1 [drp] via OPHTHALMIC
  Filled 2018-02-20: qty 2.5

## 2018-02-20 MED ORDER — GUAIFENESIN ER 600 MG PO TB12
600.0000 mg | ORAL_TABLET | Freq: Two times a day (BID) | ORAL | Status: DC
  Administered 2018-02-21 – 2018-02-26 (×12): 600 mg via ORAL
  Filled 2018-02-20 (×12): qty 1

## 2018-02-20 MED ORDER — ONDANSETRON HCL 4 MG/2ML IJ SOLN: 4.0000 mg | Freq: Four times a day (QID) | INTRAMUSCULAR | Status: DC | PRN

## 2018-02-20 MED ORDER — ACETAMINOPHEN 650 MG RE SUPP: 650.0000 mg | Freq: Four times a day (QID) | RECTAL | Status: DC | PRN

## 2018-02-20 MED ORDER — ALBUTEROL SULFATE (2.5 MG/3ML) 0.083% IN NEBU
2.5000 mg | INHALATION_SOLUTION | RESPIRATORY_TRACT | Status: DC
  Administered 2018-02-20 – 2018-02-21 (×5): 2.5 mg via RESPIRATORY_TRACT
  Filled 2018-02-20 (×5): qty 3

## 2018-02-20 MED ORDER — TORSEMIDE 20 MG PO TABS
60.0000 mg | ORAL_TABLET | Freq: Every day | ORAL | Status: DC
  Administered 2018-02-21 (×2): 60 mg via ORAL
  Filled 2018-02-20 (×2): qty 3

## 2018-02-20 MED ORDER — FLUTICASONE PROPIONATE 50 MCG/ACT NA SUSP
1.0000 | Freq: Every day | NASAL | Status: DC
  Administered 2018-02-21 – 2018-02-26 (×7): 1 via NASAL
  Filled 2018-02-20: qty 16

## 2018-02-20 MED ORDER — PANTOPRAZOLE SODIUM 40 MG PO TBEC
40.0000 mg | DELAYED_RELEASE_TABLET | Freq: Every day | ORAL | Status: DC
  Administered 2018-02-21 – 2018-02-26 (×6): 40 mg via ORAL
  Filled 2018-02-20 (×7): qty 1

## 2018-02-20 MED ORDER — FLEET ENEMA 7-19 GM/118ML RE ENEM: 1.0000 | ENEMA | Freq: Once | RECTAL | Status: DC | PRN

## 2018-02-20 MED ORDER — AZITHROMYCIN 500 MG IV SOLR
250.0000 mg | INTRAVENOUS | Status: DC
  Administered 2018-02-21 – 2018-02-22 (×2): 250 mg via INTRAVENOUS
  Filled 2018-02-20 (×4): qty 250

## 2018-02-20 MED ORDER — TRAZODONE HCL 50 MG PO TABS
75.0000 mg | ORAL_TABLET | Freq: Every day | ORAL | Status: DC
  Administered 2018-02-22 – 2018-02-25 (×4): 75 mg via ORAL
  Filled 2018-02-20 (×5): qty 2

## 2018-02-20 MED ORDER — MECLIZINE HCL 25 MG PO TABS
12.5000 mg | ORAL_TABLET | Freq: Three times a day (TID) | ORAL | Status: DC | PRN
  Administered 2018-02-23 – 2018-02-24 (×2): 12.5 mg via ORAL
  Filled 2018-02-20 (×2): qty 1

## 2018-02-20 MED ORDER — INSULIN ASPART 100 UNIT/ML ~~LOC~~ SOLN
0.0000 [IU] | Freq: Three times a day (TID) | SUBCUTANEOUS | Status: DC
  Administered 2018-02-21: 2 [IU] via SUBCUTANEOUS
  Administered 2018-02-21 – 2018-02-22 (×2): 3 [IU] via SUBCUTANEOUS
  Administered 2018-02-22: 2 [IU] via SUBCUTANEOUS
  Administered 2018-02-23 (×2): 3 [IU] via SUBCUTANEOUS
  Administered 2018-02-24: 2 [IU] via SUBCUTANEOUS
  Administered 2018-02-24 (×2): 3 [IU] via SUBCUTANEOUS
  Administered 2018-02-25: 2 [IU] via SUBCUTANEOUS
  Administered 2018-02-25: 5 [IU] via SUBCUTANEOUS
  Administered 2018-02-26: 3 [IU] via SUBCUTANEOUS

## 2018-02-20 MED ORDER — POLYETHYLENE GLYCOL 3350 17 G PO PACK
17.0000 g | PACK | Freq: Every day | ORAL | Status: DC
  Administered 2018-02-21 – 2018-02-25 (×3): 17 g via ORAL
  Filled 2018-02-20 (×5): qty 1

## 2018-02-20 MED ORDER — IPRATROPIUM-ALBUTEROL 0.5-2.5 (3) MG/3ML IN SOLN
3.0000 mL | Freq: Once | RESPIRATORY_TRACT | Status: AC
  Administered 2018-02-20: 3 mL via RESPIRATORY_TRACT
  Filled 2018-02-20: qty 3

## 2018-02-20 MED ORDER — INSULIN ASPART 100 UNIT/ML ~~LOC~~ SOLN
0.0000 [IU] | Freq: Every day | SUBCUTANEOUS | Status: DC
  Administered 2018-02-21: 2 [IU] via SUBCUTANEOUS
  Administered 2018-02-22: 3 [IU] via SUBCUTANEOUS

## 2018-02-20 MED ORDER — CEPASTAT 14.5 MG MT LOZG
1.0000 | LOZENGE | OROMUCOSAL | Status: DC | PRN
  Filled 2018-02-20: qty 9

## 2018-02-20 MED ORDER — ONDANSETRON HCL 4 MG PO TABS: 4.0000 mg | ORAL_TABLET | Freq: Four times a day (QID) | ORAL | Status: DC | PRN

## 2018-02-20 MED ORDER — LORATADINE 10 MG PO TABS
10.0000 mg | ORAL_TABLET | Freq: Every day | ORAL | Status: DC
  Administered 2018-02-21 – 2018-02-26 (×7): 10 mg via ORAL
  Filled 2018-02-20 (×8): qty 1

## 2018-02-20 MED ORDER — TORSEMIDE 20 MG PO TABS: 40.0000 mg | ORAL_TABLET | Freq: Every day | ORAL | Status: DC

## 2018-02-20 MED ORDER — POTASSIUM CHLORIDE CRYS ER 20 MEQ PO TBCR
20.0000 meq | EXTENDED_RELEASE_TABLET | Freq: Every day | ORAL | Status: DC
  Administered 2018-02-22 – 2018-02-23 (×2): 20 meq via ORAL
  Filled 2018-02-20 (×2): qty 1

## 2018-02-20 MED ORDER — ACETAMINOPHEN 325 MG PO TABS
650.0000 mg | ORAL_TABLET | Freq: Four times a day (QID) | ORAL | Status: DC | PRN
  Administered 2018-02-25: 650 mg via ORAL
  Filled 2018-02-20: qty 2

## 2018-02-20 MED ORDER — PAROXETINE HCL 10 MG PO TABS
10.0000 mg | ORAL_TABLET | Freq: Every day | ORAL | Status: DC
  Administered 2018-02-21 – 2018-02-26 (×6): 10 mg via ORAL
  Filled 2018-02-20 (×6): qty 1

## 2018-02-20 NOTE — ED Provider Notes (Signed)
My note from Dr. Wilson Singer.  84 year old female with increased shortness of breath over the last month or so.  She just was admitted and discharged about a week ago for CHF exacerbation.  Since being home she continues to struggle at night in the morning with shortness of breath and the daughter has noticed that her saturations have been in the mid 33s.  She is not on oxygen at home.  For the last day or so she has had increased work of breathing and sputum production. Physical Exam  BP (!) 145/72 (BP Location: Left Wrist)   Pulse 78   Temp 98.6 F (37 C) (Oral)   Resp (!) 24   SpO2 96%   Physical Exam Currently she is on nasal cannula with sats in the mid 90s.  She is able to speak in full sentences.  She is not sure which intervention seem to help the most but she is feeling more comfortable. ED Course/Procedures   Clinical Course as of Feb 23 1151  Sat Feb 20, 2018  1736 Discussed with Santiago Glad from the hospitalist service.  They are admitting to their team.   [MB]    Clinical Course User Index [MB] Hayden Rasmussen, MD    Procedures  MDM  Plan is to get the rest of her lab work back and then call the hospitalist team for readmission.  Her chest x-ray did not show an obvious infiltrate and she was given steroids Zithromax and breathing treatments along with some Lasix.       Hayden Rasmussen, MD 02/22/18 1153

## 2018-02-20 NOTE — ED Notes (Signed)
Patient transported to X-ray 

## 2018-02-20 NOTE — ED Triage Notes (Signed)
[facility]- pt coming from home was here last week and diagnosed with CHF. Pt had SOB today and EMS was called. Pt also had 2 episodes of emesis. Pt uses neb X3/day and has only used once today. SPO2 was initially in the 80's with EMS. She had 10mg  albuterol and 1mg  of atrovent. Pt reports breathing has improved. Skin warm and dry.

## 2018-02-20 NOTE — ED Provider Notes (Signed)
[facility] EMERGENCY DEPARTMENT Provider Note   CSN: [id number] Arrival date & time: 02/20/18  1411     History   Chief Complaint Chief Complaint  Patient presents with  . Shortness of Breath    HPI [name] is a 84 y.o. female.  HPI   84 year old female with dyspnea.  Patient was just discharged from the hospital on 4/1 for decompensated diastolic heart failure.  She was feeling better at that time but in the last 2 days she has had increasing dyspnea.  Productive cough.  She has been wheezing.  Overall, her lower extremity and has been improving but slightly worse again.  She denies any chest pain.  No fevers.  Patient has been using her albuterol without much improvement.  The last few days she is also been intermittently nauseated and has vomited twice.  Denies any abdominal pain.  No acute urinary complaints.  On EMS arrival her oxygen saturations were in the 80s on room air.  She was given 10 mg of albuterol and 1 mg of Atrovent.  She is chronically on 7.5 mg of prednisone daily for polymyalgia rheumatica.  Past Medical History:  Diagnosis Date  . AAA (abdominal aortic aneurysm) ([place])    a. 12/2011 U/S: 3.3 cm prox AAA.  [name] Anxiety   . Aortic insufficiency    a. S/P bioprosthetic AVR;  b. 02/2013 Echo: EF 50-55%, mild to mod AI (mean grad 58mmHg).  . Brain aneurysm    Hx of  . Chest pain    a. H/o nonobs cath;  b. 07/2013 Lexiscan MV: EF 66%, no ischemia.  . Chronic diastolic CHF (congestive heart failure) ([place])    a. 02/2013 Echo: EF 50-55%.  . Diverticulosis   . Gastritis   . Hemorrhoids   . Hepatitis B 1980's  . Hiatal hernia   . Hypertension   . Moderate mitral regurgitation    a. 02/2013 Echo: Mod MR.  . Moderate tricuspid regurgitation    a. 02/2013 Echo: Mod TR.  [name] Permanent atrial fibrillation ([place])    a. Chronic coumadin.  . Polymyalgia rheumatica ([place])   . Pulmonary hypertension ([place])    a. 02/2013 Echo: PASP 58mmHg.  . S/P aortic  valve replacement with bioprosthetic valve    a. [facility].  . S/P placement of cardiac pacemaker    a.  08/2007 s/p MDT [place] PPM.  . Seizures ([place])   . Shingles   . Skin cancer    face, ear  and left hand.  . Tachy-brady syndrome ([place])    a. Severe in the setting of permanent atrial fibrillation with tachybrady syndrome;  b. 08/2007 s/p MDT [place] PPM.  . Thoracic aneurysm    a. 10/2011 CTA: 5.0 x 3.8 cm.    Patient Active Problem List   Diagnosis Date Noted  . Dyspnea 02/12/2018  . Anxiety disorder 07/29/2017  . Hypertension 07/29/2017  . Polymyalgia rheumatica ([place]) 07/29/2017  . Left knee pain 04/21/2017  . Pharyngoesophageal phase dysphagia 04/20/2017  . Multifocal pneumonia 04/17/2017  . Acute respiratory failure with hypoxia ([place]) 04/15/2017  . Hemoptysis   . Thoracic aortic aneurysm without rupture ([place]) 11/21/2016  . Complete heart block ([place]) 08/20/2016  . CHF (congestive heart failure) ([place]) 05/10/2016  . Acute respiratory failure ([place]) 05/10/2016  . Acute diastolic (congestive) heart failure ([place]) 05/10/2016  . Rheumatoid arthritis ([place]) 05/10/2016  . Insomnia 05/10/2016  . Nausea 03/31/2016  . Pain in the muscles  08/31/2015  . S/P AAA repair 03/27/2015  . Tachy-brady syndrome ([place]) 03/27/2015  . Constipation 03/27/2015  . Dizziness 11/01/2014  . Pneumonia, organism unspecified(486) 11/01/2014  . Fatigue 02/27/2014  . Pulmonary hypertension ([place]) 01/26/2014  . Encounter for therapeutic drug monitoring 12/15/2013  . Bronchitis 12/15/2013  . Frequent PVCs 12/15/2013  . Exertional angina ([place]) 07/07/2013  . Bradycardia 07/07/2013  . Ulcer of other part of lower limb 12/28/2012  . Hyperlipidemia 12/26/2010  . TIA 12/26/2010  . PACEMAKER-Medtronic 05/07/2010  . S/P aortic valve replacement with bioprosthetic valve 01/15/2010  . MURMUR 01/15/2010  . DIASTOLIC HEART FAILURE, CHRONIC 10/26/2009  . PVD 07/17/2009  . ATRIAL  FIBRILLATION 06/19/2009  . DIZZINESS AND GIDDINESS 06/19/2009    Past Surgical History:  Procedure Laterality Date  . ABDOMINAL AORTIC ANEURYSM REPAIR     Resection  . AORTIC VALVE REPLACEMENT     With bioprosthetic valve  . APPENDECTOMY    . BREAST BIOPSY    . CEREBRAL ANEURYSM REPAIR    . CHOLECYSTECTOMY    . EP IMPLANTABLE DEVICE N/A 08/20/2016   Procedure: PPM Generator Changeout;  Surgeon: Deboraha Sprang, MD;  Location: [facility];  Service: Cardiovascular;  Laterality: N/A;  . LUNG BIOPSY  2005  . PACEMAKER PLACEMENT  08/2007   Medtronic; AF permanent; s/p pacemaker for bradycardia with now well controlled ventricular response; Digoxin therapy, question level; Lower extremity venous issues  . skin cancer removal     ear, face and hand  . VESICOVAGINAL FISTULA CLOSURE W/ TAH       OB History    Gravida  0   Para  0   Term  0   Preterm  0   AB  0   Living  0     SAB  0   TAB  0   Ectopic  0   Multiple  0   Live Births               Home Medications    Prior to Admission medications   Medication Sig Start Date End Date Taking? Authorizing Provider  acetaminophen (TYLENOL) 500 MG tablet Take 1 tablet (500 mg total) by mouth every 6 (six) hours. Patient taking differently: Take 500 mg by mouth every 6 (six) hours as needed (for pain or headaches).  04/23/17   Johnson, Clanford L, MD  albuterol (PROVENTIL HFA;VENTOLIN HFA) 108 (90 Base) MCG/ACT inhaler Inhale 1 puff into the lungs every 6 (six) hours as needed for wheezing or shortness of breath.    [provider]  albuterol (PROVENTIL) (2.5 MG/3ML) 0.083% nebulizer solution Inhale 1 vial via nebulizer every 6 hours as needed .J44.9 Patient taking differently: Take 2.5 mg by nebulization 3 (three) times daily.  02/10/18   Laverle Hobby, MD  AMBULATORY NON FORMULARY MEDICATION Medication Name: AeroChamber DME:[facility] [place]:Y07.37 12/18/17   Laverle Hobby, MD  bacitracin ointment  Apply 1 application topically 2 (two) times daily. Apply to skin tears, then place a non-stick dressing 01/14/18   Duffy Bruce, MD  Brinzolamide-Brimonidine [facility]) 1-0.2 % SUSP Place 1 drop into both eyes daily.     [provider]  budesonide-formoterol (SYMBICORT) 160-4.5 MCG/ACT inhaler Inhale 1 puff into the lungs 2 (two) times daily.    [provider]  diclofenac sodium (VOLTAREN) 1 % GEL Apply 4 g topically See admin instructions. Apply 4 grams to both knees two times a day    [provider]  flintstones complete (FLINTSTONES) 60  MG chewable tablet Chew 1 tablet by mouth daily.    [provider]  ipratropium (ATROVENT) 0.02 % nebulizer solution Take 2.5 mLs (0.5 mg total) by nebulization 4 (four) times daily. As needed J44.9 Patient taking differently: Take 0.5 mg by nebulization 3 (three) times daily.  02/10/18   Laverle Hobby, MD  latanoprost (XALATAN) 0.005 % ophthalmic solution Place 1 drop into both eyes at bedtime.    [provider]  LORazepam (ATIVAN) 0.5 MG tablet Take 1 tablet (0.5 mg total) by mouth every 8 (eight) hours as needed for anxiety. Patient taking differently: Take 0.5 mg by mouth 3 (three) times daily.  04/23/17   Johnson, Clanford L, MD  meclizine (ANTIVERT) 25 MG tablet Take 12.5 mg by mouth 3 (three) times daily as needed for dizziness.    [provider]  oxyCODONE (ROXICODONE) 5 MG immediate release tablet Take 0.5 tablets (2.5 mg total) by mouth every 6 (six) hours as needed for severe pain. Patient not taking: Reported on 02/12/2018 01/14/18   Duffy Bruce, MD  pantoprazole (PROTONIX) 40 MG tablet Take 40 mg by mouth daily.     [provider]  PARoxetine (PAXIL) 10 MG tablet Take 10 mg by mouth daily. 01/26/18   [provider]  polyethylene glycol (MIRALAX / GLYCOLAX) packet Take 17 g by mouth daily as needed for moderate constipation. Patient taking differently: Take 17 g by  mouth daily.  04/23/17   Johnson, Clanford L, MD  potassium chloride SA (K-DUR,KLOR-CON) 20 MEQ tablet TAKE ONE TABLET BY MOUTH EACH EVENING Patient taking differently: Take 20 mEq by mouth in the evening 12/16/16   Minna Merritts, MD  predniSONE (DELTASONE) 5 MG tablet Take 7.5 mg by mouth daily. 02/01/18   [provider]  torsemide (DEMADEX) 20 MG tablet Take 2 tablets (40 mg total) by mouth daily. 02/16/18   Lavina Hamman, MD  traMADol (ULTRAM) 50 MG tablet Take 1 tablet (50 mg total) by mouth every 6 (six) hours as needed for moderate pain. Patient taking differently: Take 25 mg by mouth at bedtime.  04/23/17   Johnson, Clanford L, MD  traZODone (DESYREL) 50 MG tablet Take 75 mg by mouth at bedtime.     [provider]  warfarin (COUMADIN) 5 MG tablet TAKE AS DIRECTED BY THE COUMADIN CLINIC Patient taking differently: Take 2.5-5 mg by mouth See admin instructions. Take 2.5 mg by mouth in the evening at 6 PM on Sun/Mon/Wed/Thurs/Fri and 5 mg on Tues/Sat 12/23/17   Minna Merritts, MD    Family History Family History  Problem Relation Age of Onset  . Cancer Mother   . Heart disease Mother   . Hypertension Mother   . Varicose Veins Mother   . Heart disease Father   . Hypertension Father   . Heart disease Sister   . Hypertension Sister   . Varicose Veins Sister   . AAA (abdominal aortic aneurysm) Sister   . Heart disease Brother   . Hypertension Brother     Social History Social History   Tobacco Use  . Smoking status: Never Smoker  . Smokeless tobacco: Never Used  Substance Use Topics  . Alcohol use: No  . Drug use: No     Allergies   Advair diskus [fluticasone-salmeterol]; Furosemide; Other; Salmeterol; Iohexol; Sulfonamide derivatives; Verapamil; Adhesive [tape]; Amoxicillin-pot clavulanate; Contrast media [iodinated diagnostic agents]; Ibuprofen; Levofloxacin; Macrobid [nitrofurantoin monohyd macro]; Latex; and Penicillins   Review of Systems Review  of Systems  All systems reviewed and negative, other than as noted in HPI.  Physical Exam Updated Vital Signs BP (!) 145/72 (BP Location: Left Wrist)   Pulse 78   Temp 98.6 F (37 C) (Oral)   Resp (!) 24   SpO2 96%   Physical Exam  Constitutional: She appears well-developed and well-nourished. No distress.  HENT:  Head: Normocephalic and atraumatic.  Eyes: Conjunctivae are normal. Right eye exhibits no discharge. Left eye exhibits no discharge.  Neck: Neck supple.  Cardiovascular: Normal rate and regular rhythm. Exam reveals no gallop and no friction rub.  Murmur heard. Systolic murmur  Pulmonary/Chest:  Mild tachypnea.  Bilateral wheezing.  Abdominal: Soft. She exhibits no distension. There is no tenderness.  Musculoskeletal: She exhibits edema. She exhibits no tenderness.  Neurological: She is alert.  Skin: Skin is warm and dry.  Psychiatric: She has a normal mood and affect. Her behavior is normal. Thought content normal.  Nursing note and vitals reviewed.    ED Treatments / Results  Labs (all labs ordered are listed, but only abnormal results are displayed) Labs Reviewed  CBC WITH DIFFERENTIAL/PLATELET - Abnormal; Notable for the following components:      Result Value   Platelets 129 (*)    Lymphs Abs 0.4 (*)    All other components within normal limits  BASIC METABOLIC PANEL  BRAIN NATRIURETIC PEPTIDE  TROPONIN I  PROTIME-INR    EKG EKG Interpretation  Date/Time:  Saturday February 20 2018 14:20:45 EDT Ventricular Rate:  79 PR Interval:    QRS Duration: 145 QT Interval:  437 QTC Calculation: 501 R Axis:   120 Text Interpretation:  Ventricular-paced rhythm Confirmed by Virgel Manifold [phone]) on 02/20/2018 3:46:42 PM   Radiology Dg Chest 2 View  Result Date: 02/20/2018 CLINICAL DATA:  Pt complains of SOB x 2 weeks with occasional generalized CP; also reports "terrible" cough but unsure of timeframe; emesis x 2 today; pt here within the last week and dx w/  CHF per ED notes; hx of HTN treatment and pacemaker insertion; non-smoker; chart also shows hx of AAA EXAM: CHEST - 2 VIEW COMPARISON:  02/12/2018 FINDINGS: Cardiac silhouette is enlarged. There stable changes from prior cardiac surgery and valve replacement. There is significant enlargement of the thoracic aorta, particularly the aortic knob, stable from the prior study. No mediastinal or hilar masses. Small bilateral pleural effusions. Lungs demonstrate thickened bronchovascular markings. Small area of focal opacity noted in the right mid lung likely atelectasis or fluid along the minor fissure. No pneumothorax. Findings are without significant change from the most recent prior chest radiograph. Left anterior chest wall single lead pacemaker is stable. IMPRESSION: 1. Cardiomegaly, prominent bronchovascular markings and small pleural effusions. Suspect mild congestive heart failure. Findings are stable when compared to the most recent prior chest radiograph. 2. Thoracic aortic aneurysm. Electronically Signed   By: Lajean Manes M.D.   On: 02/20/2018 15:49    Procedures Procedures (including critical care time)  Medications Ordered in ED Medications  ipratropium-albuterol (DUONEB) 0.5-2.5 (3) MG/3ML nebulizer solution 3 mL (has no administration in time range)  methylPREDNISolone sodium succinate (SOLU-MEDROL) 125 mg/2 mL injection 125 mg (has no administration in time range)  furosemide (LASIX) injection 40 mg (has no administration in time range)  azithromycin (ZITHROMAX) tablet 500 mg (has no administration in time range)     Initial Impression / Assessment and Plan / ED Course  I have reviewed the triage vital signs and the nursing notes.  Pertinent labs &  imaging results that were available during my care of the patient were reviewed by me and considered in my medical decision making (see chart for details).  84 year old female with increasing dyspnea. Feeling better after albuterol/atrovent  by EMS but still dypneic and wheezing on exam. Additional nebs.  She reports a productive cough.  Underlying COPD.  Chest x-ray does not appear acutely changed from most recent.  Given her hypoxemia productive cough will cover with antibiotics.  Multiple allergies.  Azithromycin was ordered.  Chronically on low-dose prednisone.  Additional steroids were ordered.  She was given some Lasix although I feel HF is not the primary etiology of presenting symptoms. Denies CP. EKG v-paced.  Will admit since has new oxygen requirement.   Final Clinical Impressions(s) / ED Diagnoses   Final diagnoses:  COPD exacerbation [facility])    ED Discharge Orders    None       Virgel Manifold, MD 02/20/18 [phone]

## 2018-02-20 NOTE — Progress Notes (Signed)
ANTICOAGULATION CONSULT NOTE - Initial Consult  Pharmacy Consult for coumadin Indication: atrial fibrillation  Allergies  Allergen Reactions  . Advair Diskus [Fluticasone-Salmeterol] Anaphylaxis  . Furosemide Shortness Of Breath  . Other Shortness Of Breath and Other (See Comments)    IODINATED CONTRAST MEDIA: Chills and fever also  . Salmeterol Anaphylaxis    THROAT CLOSES with Advair  . Iohexol Other (See Comments)    Desc:  Pt requires a 13 hr prep, when doing exam with contrast....fever, chills, dyspnea (slg/04/27/09--given 2 hour solumedrol/ benadryl prep--no reaction).    . Sulfonamide Derivatives Nausea And Vomiting  . Verapamil Hives, Nausea And Vomiting and Other (See Comments)    Loss of appetite also   . Adhesive [Tape] Other (See Comments)    TEARS and BRUISES SKIN  . Amoxicillin-Pot Clavulanate Nausea Only and Other (See Comments)    NAUSEA AND STOMACH PAIN  . Contrast Media [Iodinated Diagnostic Agents] Other (See Comments)    "Froze" and fever  . Ibuprofen Other (See Comments)    Has liver damage  . Levofloxacin Nausea Only and Other (See Comments)    NAUSEA AND STOMACH PAIN  . Macrobid [Nitrofurantoin Monohyd Macro] Nausea Only and Other (See Comments)    Severe stomach pain  . Latex Rash  . Penicillins Rash and Other (See Comments)    Has patient had a PCN reaction causing immediate rash, facial/tongue/throat swelling, SOB or lightheadedness with hypotension: Yes Has patient had a PCN reaction causing severe rash involving mucus membranes or skin necrosis: Unk Has patient had a PCN reaction that required hospitalization: Unk Has patient had a PCN reaction occurring within the last 10 years: No If all of the above answers are "NO", then may proceed with Cephalosporin use.     Vital Signs: Temp: 98.6 F (37 C) (04/06 1422) Temp Source: Oral (04/06 1422) BP: 145/72 (04/06 1422) Pulse Rate: 78 (04/06 1422)  Labs: Recent Labs    02/20/18 1604  HGB 12.3   HCT 39.9  PLT 129*  LABPROT 33.4*  INR 3.32  CREATININE 1.15*  TROPONINI 0.09*    Estimated Creatinine Clearance: 25.3 mL/min (A) (by C-G formula based on SCr of 1.15 mg/dL (H)).   Medical History: Past Medical History:  Diagnosis Date  . AAA (abdominal aortic aneurysm) ([place])    a. 12/2011 U/S: 3.3 cm prox AAA.  [name] Anxiety   . Aortic insufficiency    a. S/P bioprosthetic AVR;  b. 02/2013 Echo: EF 50-55%, mild to mod AI (mean grad 17mmHg).  . Brain aneurysm    Hx of  . Chest pain    a. H/o nonobs cath;  b. 07/2013 Lexiscan MV: EF 66%, no ischemia.  . Chronic diastolic CHF (congestive heart failure) ([place])    a. 02/2013 Echo: EF 50-55%.  . Diverticulosis   . Gastritis   . Hemorrhoids   . Hepatitis B 1980's  . Hiatal hernia   . Hypertension   . Moderate mitral regurgitation    a. 02/2013 Echo: Mod MR.  . Moderate tricuspid regurgitation    a. 02/2013 Echo: Mod TR.  [name] Permanent atrial fibrillation ([place])    a. Chronic coumadin.  . Polymyalgia rheumatica ([place])   . Pulmonary hypertension ([place])    a. 02/2013 Echo: PASP 73mmHg.  . S/P aortic valve replacement with bioprosthetic valve    a. [facility].  . S/P placement of cardiac pacemaker    a.  08/2007 s/p MDT [place] PPM.  . Seizures ([place])   .  Shingles   . Skin cancer    face, ear  and left hand.  . Tachy-brady syndrome ([place])    a. Severe in the setting of permanent atrial fibrillation with tachybrady syndrome;  b. 08/2007 s/p MDT [place] PPM.  . Thoracic aneurysm    a. 10/2011 CTA: 5.0 x 3.8 cm.    Scheduled:  . albuterol  2.5 mg Nebulization Q4H  . fluticasone  1 spray Each Nare Daily  . guaiFENesin  600 mg Oral BID  . latanoprost  1 drop Both Eyes QHS  . loratadine  10 mg Oral Daily  . LORazepam  0.5 mg Oral TID  . methylPREDNISolone (SOLU-MEDROL) injection  60 mg Intravenous Q12H  . mometasone-formoterol  2 puff Inhalation BID  . pantoprazole  40 mg Oral Daily  . PARoxetine  10 mg Oral  Daily  . polyethylene glycol  17 g Oral Daily  . potassium chloride SA  20 mEq Oral Daily  . potassium chloride  40 mEq Oral BID  . torsemide  60 mg Oral Daily  . traMADol  25 mg Oral QHS  . traZODone  75 mg Oral QHS    Assessment: 84 yo female here with SOB. She is on coumadin PTA for afib (also history of bioprosthetic AVR) and pharmacy consulted to dose.  -INR= 3.32  Home dose: 2.5mg /day except 5mg  on TuSa (last clinic visit 02/08/18; INR= 2.8)  Goal of Therapy:  Goal INR 2.5- 3.0 (per clinic notes) Monitor platelets by anticoagulation protocol: Yes   Plan:  -Hold coumadin tonight -Daily PT/INR  [name], PharmD Clinical Pharmacist 02/20/2018 6:01 PM

## 2018-02-20 NOTE — H&P (Signed)
History and Physical    [name]:[id number] DOB: 1922-06-16 DOA: 02/20/2018  PCP: Jodi Marble, MD Patient coming from: home  Chief Complaint: sob  HPI: [name] is a very pleasant 84 y.o. female with medical history significant 4 CHF, moderate MR, A. Fib on Coumadin, PMR presents to the emergency Department chief complaint worsening shortness of breath. Initial evaluation reveals acute respiratory failure with hypoxia likely related to COPD exacerbation and mild acute on chronic heart failure. [facility] hospitalist are asked to admit.  information is obtained from the patient and her niece is at the bedside.patient discharged 5 days ago after being hospitalized for acute diastolic heart failure. Recent reports gradual worsening shortness of breath with an associated 3 pound weight gain. She denies chest pain palpitations worsening lower extremity edema. She denies abdominal pain nausea vomiting diarrhea constipation melena bright red blood per rectum. She denies any dysuria hematuria frequency or urgency. She reports compliance with her medications. He also reports her oxygen saturation level has been in the 80s at home. He also states her shortness of breath is worse at night.    ED Course: in the emergency department she's afebrile hemodynamically stable with an oxygen saturation level greater than 90% on 2 L nasal cannula. She is provided with nebulizers azithromycin 25 mg of Solu-Medrol  Review of Systems: As per HPI otherwise all other systems reviewed and are negative.   Ambulatory Status: ambulates with a walker with unsteady gait.  Past Medical History:  Diagnosis Date  . AAA (abdominal aortic aneurysm) ([place])    a. 12/2011 U/S: 3.3 cm prox AAA.  [name] Anxiety   . Aortic insufficiency    a. S/P bioprosthetic AVR;  b. 02/2013 Echo: EF 50-55%, mild to mod AI (mean grad 30mmHg).  . Brain aneurysm    Hx of  . Chest pain    a. H/o nonobs cath;  b. 07/2013 Lexiscan MV:  EF 66%, no ischemia.  . Chronic diastolic CHF (congestive heart failure) ([place])    a. 02/2013 Echo: EF 50-55%.  . Diverticulosis   . Gastritis   . Hemorrhoids   . Hepatitis B 1980's  . Hiatal hernia   . Hypertension   . Moderate mitral regurgitation    a. 02/2013 Echo: Mod MR.  . Moderate tricuspid regurgitation    a. 02/2013 Echo: Mod TR.  [name] Permanent atrial fibrillation ([place])    a. Chronic coumadin.  . Polymyalgia rheumatica ([place])   . Pulmonary hypertension ([place])    a. 02/2013 Echo: PASP 44mmHg.  . S/P aortic valve replacement with bioprosthetic valve    a. [facility].  . S/P placement of cardiac pacemaker    a.  08/2007 s/p MDT [place] PPM.  . Seizures ([place])   . Shingles   . Skin cancer    face, ear  and left hand.  . Tachy-brady syndrome ([place])    a. Severe in the setting of permanent atrial fibrillation with tachybrady syndrome;  b. 08/2007 s/p MDT [place] PPM.  . Thoracic aneurysm    a. 10/2011 CTA: 5.0 x 3.8 cm.    Past Surgical History:  Procedure Laterality Date  . ABDOMINAL AORTIC ANEURYSM REPAIR     Resection  . AORTIC VALVE REPLACEMENT     With bioprosthetic valve  . APPENDECTOMY    . BREAST BIOPSY    . CEREBRAL ANEURYSM REPAIR    . CHOLECYSTECTOMY    . EP IMPLANTABLE DEVICE N/A 08/20/2016  Procedure: PPM Generator Changeout;  Surgeon: Deboraha Sprang, MD;  Location: [facility];  Service: Cardiovascular;  Laterality: N/A;  . LUNG BIOPSY  2005  . PACEMAKER PLACEMENT  08/2007   Medtronic; AF permanent; s/p pacemaker for bradycardia with now well controlled ventricular response; Digoxin therapy, question level; Lower extremity venous issues  . skin cancer removal     ear, face and hand  . VESICOVAGINAL FISTULA CLOSURE W/ TAH      Social History   Socioeconomic History  . Marital status: Widowed    Spouse name: Not on file  . Number of children: Not on file  . Years of education: Not on file  . Highest education level: Not on  file  Occupational History  . Occupation: Retired    Fish farm manager: RETIRED  Social Needs  . Financial resource strain: Not on file  . Food insecurity:    Worry: Not on file    Inability: Not on file  . Transportation needs:    Medical: Not on file    Non-medical: Not on file  Tobacco Use  . Smoking status: Never Smoker  . Smokeless tobacco: Never Used  Substance and Sexual Activity  . Alcohol use: No  . Drug use: No  . Sexual activity: Not on file  Lifestyle  . Physical activity:    Days per week: Not on file    Minutes per session: Not on file  . Stress: Not on file  Relationships  . Social connections:    Talks on phone: Not on file    Gets together: Not on file    Attends religious service: Not on file    Active member of club or organization: Not on file    Attends meetings of clubs or organizations: Not on file    Relationship status: Not on file  . Intimate partner violence:    Fear of current or ex partner: Not on file    Emotionally abused: Not on file    Physically abused: Not on file    Forced sexual activity: Not on file  Other Topics Concern  . Not on file  Social History Narrative   Married    Allergies  Allergen Reactions  . Advair Diskus [Fluticasone-Salmeterol] Anaphylaxis  . Furosemide Shortness Of Breath  . Other Shortness Of Breath and Other (See Comments)    IODINATED CONTRAST MEDIA: Chills and fever also  . Salmeterol Anaphylaxis    THROAT CLOSES with Advair  . Iohexol Other (See Comments)    Desc:  Pt requires a 13 hr prep, when doing exam with contrast....fever, chills, dyspnea (slg/04/27/09--given 2 hour solumedrol/ benadryl prep--no reaction).    . Sulfonamide Derivatives Nausea And Vomiting  . Verapamil Hives, Nausea And Vomiting and Other (See Comments)    Loss of appetite also   . Adhesive [Tape] Other (See Comments)    TEARS and BRUISES SKIN  . Amoxicillin-Pot Clavulanate Nausea Only and Other (See Comments)    NAUSEA AND STOMACH  PAIN  . Contrast Media [Iodinated Diagnostic Agents] Other (See Comments)    "Froze" and fever  . Ibuprofen Other (See Comments)    Has liver damage  . Levofloxacin Nausea Only and Other (See Comments)    NAUSEA AND STOMACH PAIN  . Macrobid [Nitrofurantoin Monohyd Macro] Nausea Only and Other (See Comments)    Severe stomach pain  . Latex Rash  . Penicillins Rash and Other (See Comments)    Has patient had a PCN reaction causing immediate  rash, facial/tongue/throat swelling, SOB or lightheadedness with hypotension: Yes Has patient had a PCN reaction causing severe rash involving mucus membranes or skin necrosis: Unk Has patient had a PCN reaction that required hospitalization: Unk Has patient had a PCN reaction occurring within the last 10 years: No If all of the above answers are "NO", then may proceed with Cephalosporin use.     Family History  Problem Relation Age of Onset  . Cancer Mother   . Heart disease Mother   . Hypertension Mother   . Varicose Veins Mother   . Heart disease Father   . Hypertension Father   . Heart disease Sister   . Hypertension Sister   . Varicose Veins Sister   . AAA (abdominal aortic aneurysm) Sister   . Heart disease Brother   . Hypertension Brother     Prior to Admission medications   Medication Sig Start Date End Date Taking? Authorizing Provider  acetaminophen (TYLENOL) 500 MG tablet Take 1 tablet (500 mg total) by mouth every 6 (six) hours. Patient taking differently: Take 500 mg by mouth every 6 (six) hours as needed (for pain or headaches).  04/23/17   Johnson, Clanford L, MD  albuterol (PROVENTIL HFA;VENTOLIN HFA) 108 (90 Base) MCG/ACT inhaler Inhale 1 puff into the lungs every 6 (six) hours as needed for wheezing or shortness of breath.    [provider]  albuterol (PROVENTIL) (2.5 MG/3ML) 0.083% nebulizer solution Inhale 1 vial via nebulizer every 6 hours as needed .J44.9 Patient taking differently: Take 2.5 mg by nebulization 3  (three) times daily.  02/10/18   Laverle Hobby, MD  AMBULATORY NON FORMULARY MEDICATION Medication Name: AeroChamber DME:[facility] KX:F81.82 12/18/17   Laverle Hobby, MD  bacitracin ointment Apply 1 application topically 2 (two) times daily. Apply to skin tears, then place a non-stick dressing 01/14/18   Duffy Bruce, MD  Brinzolamide-Brimonidine [facility]) 1-0.2 % SUSP Place 1 drop into both eyes daily.     [provider]  budesonide-formoterol (SYMBICORT) 160-4.5 MCG/ACT inhaler Inhale 1 puff into the lungs 2 (two) times daily.    [provider]  diclofenac sodium (VOLTAREN) 1 % GEL Apply 4 g topically See admin instructions. Apply 4 grams to both knees two times a day    [provider]  flintstones complete (FLINTSTONES) 60 MG chewable tablet Chew 1 tablet by mouth daily.    [provider]  ipratropium (ATROVENT) 0.02 % nebulizer solution Take 2.5 mLs (0.5 mg total) by nebulization 4 (four) times daily. As needed J44.9 Patient taking differently: Take 0.5 mg by nebulization 3 (three) times daily.  02/10/18   Laverle Hobby, MD  latanoprost (XALATAN) 0.005 % ophthalmic solution Place 1 drop into both eyes at bedtime.    [provider]  LORazepam (ATIVAN) 0.5 MG tablet Take 1 tablet (0.5 mg total) by mouth every 8 (eight) hours as needed for anxiety. Patient taking differently: Take 0.5 mg by mouth 3 (three) times daily.  04/23/17   Johnson, Clanford L, MD  meclizine (ANTIVERT) 25 MG tablet Take 12.5 mg by mouth 3 (three) times daily as needed for dizziness.    [provider]  pantoprazole (PROTONIX) 40 MG tablet Take 40 mg by mouth daily.     [provider]  PARoxetine (PAXIL) 10 MG tablet Take 10 mg by mouth daily. 01/26/18   [provider]  polyethylene glycol (MIRALAX / GLYCOLAX) packet Take 17 g by mouth daily as needed for moderate constipation. Patient taking differently: Take  17 g by mouth daily.   04/23/17   Johnson, Clanford L, MD  potassium chloride SA (K-DUR,KLOR-CON) 20 MEQ tablet TAKE ONE TABLET BY MOUTH EACH EVENING Patient taking differently: Take 20 mEq by mouth in the evening 12/16/16   Minna Merritts, MD  predniSONE (DELTASONE) 5 MG tablet Take 7.5 mg by mouth daily. 02/01/18   [provider]  torsemide (DEMADEX) 20 MG tablet Take 2 tablets (40 mg total) by mouth daily. 02/16/18   Lavina Hamman, MD  traMADol (ULTRAM) 50 MG tablet Take 1 tablet (50 mg total) by mouth every 6 (six) hours as needed for moderate pain. Patient taking differently: Take 25 mg by mouth at bedtime.  04/23/17   Johnson, Clanford L, MD  traZODone (DESYREL) 50 MG tablet Take 75 mg by mouth at bedtime.     [provider]  warfarin (COUMADIN) 5 MG tablet TAKE AS DIRECTED BY THE COUMADIN CLINIC Patient taking differently: Take 2.5-5 mg by mouth See admin instructions. Take 2.5 mg by mouth in the evening at 6 PM on Sun/Mon/Wed/Thurs/Fri and 5 mg on Tues/Sat 12/23/17   Minna Merritts, MD    Physical Exam: Vitals:   02/20/18 1415 02/20/18 1422  BP:  (!) 145/72  Pulse:  78  Resp:  (!) 24  Temp:  98.6 F (37 C)  TempSrc:  Oral  SpO2: 97% 96%     General:  Appears calm and comfortable chronically ill. Eyes:  PERRL, EOMI, normal lids, iris ENT:  grossly normal hearing, lips & tongue, ecause membranes of her mouth are pink only slightly dry Neck:  no LAD, masses or thyromegaly Cardiovascular:  Irregularly irregular, trace LE edema with right greater than left. Chronic venous stasis changes Respiratory:  Mild increased work of breathing with conversation. Breath sounds with bilateral expiratory wheezing. Frequent wet sounding cough during exam Abdomen:  soft, ntnd, positive bowel sounds throughout no guarding or rebounding Skin:  no rash or induration seen on limited exam Musculoskeletal:  grossly normal tone BUE/BLE, good ROM, no bony abnormality Psychiatric:  grossly normal mood and  affect, speech fluent and appropriate, AOx3 Neurologic:  CN 2-12 grossly intact, moves all extremities in coordinated fashion, sensation intact  Labs on Admission: I have personally reviewed following labs and imaging studies  CBC: Recent Labs  Lab 02/20/18 1604  WBC 4.3  NEUTROABS 3.5  HGB 12.3  HCT 39.9  MCV 92.8  PLT 962*   Basic Metabolic Panel: Recent Labs  Lab 02/14/18 0540 02/15/18 0255 02/20/18 1604  NA 140 140 137  K 3.1* 3.8 3.4*  CL 95* 95* 94*  CO2 34* 36* 28  GLUCOSE 109* 129* 185*  BUN 16 17 23*  CREATININE 1.06* 1.02* 1.15*  CALCIUM 8.5* 8.8* 8.9   GFR: Estimated Creatinine Clearance: 25.3 mL/min (A) (by C-G formula based on SCr of 1.15 mg/dL (H)). Liver Function Tests: No results for input(s): AST, ALT, ALKPHOS, BILITOT, PROT, ALBUMIN in the last 168 hours. No results for input(s): LIPASE, AMYLASE in the last 168 hours. No results for input(s): AMMONIA in the last 168 hours. Coagulation Profile: Recent Labs  Lab 02/14/18 0540 02/15/18 0255 02/20/18 1604  INR 2.53 1.89 3.32   Cardiac Enzymes: Recent Labs  Lab 02/20/18 1604  TROPONINI 0.09*   BNP (last 3 results) No results for input(s): PROBNP in the last 8760 hours. HbA1C: No results for input(s): HGBA1C in the last 72 hours. CBG: Recent Labs  Lab 02/14/18 1126 02/14/18 1621 02/14/18 2122 02/15/18  7858 02/15/18 1127  GLUCAP 221* 119* 218* 110* 261*   Lipid Profile: No results for input(s): CHOL, HDL, LDLCALC, TRIG, CHOLHDL, LDLDIRECT in the last 72 hours. Thyroid Function Tests: No results for input(s): TSH, T4TOTAL, FREET4, T3FREE, THYROIDAB in the last 72 hours. Anemia Panel: No results for input(s): VITAMINB12, FOLATE, FERRITIN, TIBC, IRON, RETICCTPCT in the last 72 hours. Urine analysis:    Component Value Date/Time   COLORURINE YELLOW 02/13/2018 0252   APPEARANCEUR HAZY (A) 02/13/2018 0252   LABSPEC 1.006 02/13/2018 0252   PHURINE 8.0 02/13/2018 0252   GLUCOSEU  NEGATIVE 02/13/2018 0252   HGBUR NEGATIVE 02/13/2018 0252   BILIRUBINUR NEGATIVE 02/13/2018 0252   KETONESUR NEGATIVE 02/13/2018 0252   PROTEINUR NEGATIVE 02/13/2018 0252   UROBILINOGEN 1.0 08/01/2015 2030   NITRITE NEGATIVE 02/13/2018 0252   LEUKOCYTESUR NEGATIVE 02/13/2018 0252    Creatinine Clearance: Estimated Creatinine Clearance: 25.3 mL/min (A) (by C-G formula based on SCr of 1.15 mg/dL (H)).  Sepsis Labs: @LABRCNTIP (procalcitonin:4,lacticidven:4) )No results found for this or any previous visit (from the past 240 hour(s)).   Radiological Exams on Admission: Dg Chest 2 View  Result Date: 02/20/2018 CLINICAL DATA:  Pt complains of SOB x 2 weeks with occasional generalized CP; also reports "terrible" cough but unsure of timeframe; emesis x 2 today; pt here within the last week and dx w/ CHF per ED notes; hx of HTN treatment and pacemaker insertion; non-smoker; chart also shows hx of AAA EXAM: CHEST - 2 VIEW COMPARISON:  02/12/2018 FINDINGS: Cardiac silhouette is enlarged. There stable changes from prior cardiac surgery and valve replacement. There is significant enlargement of the thoracic aorta, particularly the aortic knob, stable from the prior study. No mediastinal or hilar masses. Small bilateral pleural effusions. Lungs demonstrate thickened bronchovascular markings. Small area of focal opacity noted in the right mid lung likely atelectasis or fluid along the minor fissure. No pneumothorax. Findings are without significant change from the most recent prior chest radiograph. Left anterior chest wall single lead pacemaker is stable. IMPRESSION: 1. Cardiomegaly, prominent bronchovascular markings and small pleural effusions. Suspect mild congestive heart failure. Findings are stable when compared to the most recent prior chest radiograph. 2. Thoracic aortic aneurysm. Electronically Signed   By: Lajean Manes M.D.   On: 02/20/2018 15:49    EKG: Ventricular-paced  rhythm  Assessment/Plan Principal Problem:   Acute respiratory failure with hypoxia (HCC) Active Problems:   DIASTOLIC HEART FAILURE, CHRONIC   PACEMAKER-Medtronic   Hypertension   Hyperglycemia    1. Acute respiratory failure with hypoxia likely related to COPD exacerbation in the setting of mild acute on chronic heart failure.chest x-ray with mild pulmonary edema. BNP only slightly elevated at 230 does report a 3 pound weight gain and oxygen saturation level reportedly in the 80s at home. kG as noted above. Troponin slightly elevated at close to baseline. She has been provided with 125 mg of Solu-Medrol and azithromycin -admit to telemetry -Scheduled nebulizers -Low-dose steroids -cycle troponin -Torsemide at an increased dose (allergy to lasix) -Monitor intake and output -aily weights  #2. COPD with exacerbation. See #1. She's not on home oxygen but probably needs to be. Home medications include bronchodilators. Chest x-ray as noted above. Chart review indicates pulmonology recommending sleeping with a wedge under bed no eating 4 hours before bed. Provided with 125 mg of Solu-Medrol in the emergency department -Continue scheduled meds -Solu-Medrol -Flutter valve -Antitussives -Monitor closely  #3.Acute on chronic diastolic heart failure. Recent echo reveals an EF of  6065% no wall motion abnormality. He does have a recent 3 pound weight gain. Home medications include torsemide. She reports an allergy to Lasix -Increase torsemide dose -monitor intake and output -Obtain daily weights  #4. A. Fib. Medications include Coumadin. INR 3.2. Rate controlled. EKG as noted above. Coumadin per pharmacy -Monitor  #5. Hypokalemia. Mild. -Replete -Recheck  #6. Elevated troponin. Initial troponin 0.09. Chart review indicates this is only slightly above her baseline. Likely elated to demand.  No Chest pain. EKG as noted above. -cycle troponin  #7. Hyperglycemia. Likely related to  steroids. No history of diabetes. Serum glucose 185 -Sliding scale insulin for optimal control    DVT prophylaxis: coumadin  Code Status: dnr  Family Communication: niece at bedside  Disposition Plan: snf?  Consults called: none  Admission status: inpatient    Radene Gunning MD [facility]  If 7PM-7AM, please contact night-coverage [email or web address] Password [facility]  02/20/2018, 6:25 PM

## 2018-02-21 ENCOUNTER — Other Ambulatory Visit: Payer: Self-pay

## 2018-02-21 DIAGNOSIS — J9601 Acute respiratory failure with hypoxia: Secondary | ICD-10-CM

## 2018-02-21 DIAGNOSIS — I5033 Acute on chronic diastolic (congestive) heart failure: Secondary | ICD-10-CM

## 2018-02-21 DIAGNOSIS — R748 Abnormal levels of other serum enzymes: Secondary | ICD-10-CM

## 2018-02-21 DIAGNOSIS — J441 Chronic obstructive pulmonary disease with (acute) exacerbation: Secondary | ICD-10-CM

## 2018-02-21 LAB — GLUCOSE, CAPILLARY
GLUCOSE-CAPILLARY: 151 mg/dL — AB (ref 65–99)
GLUCOSE-CAPILLARY: 178 mg/dL — AB (ref 65–99)
GLUCOSE-CAPILLARY: 90 mg/dL (ref 65–99)
Glucose-Capillary: 229 mg/dL — ABNORMAL HIGH (ref 65–99)
Glucose-Capillary: 241 mg/dL — ABNORMAL HIGH (ref 65–99)

## 2018-02-21 LAB — BASIC METABOLIC PANEL
ANION GAP: 16 — AB (ref 5–15)
BUN: 27 mg/dL — ABNORMAL HIGH (ref 6–20)
CALCIUM: 8.8 mg/dL — AB (ref 8.9–10.3)
CO2: 25 mmol/L (ref 22–32)
Chloride: 95 mmol/L — ABNORMAL LOW (ref 101–111)
Creatinine, Ser: 1.13 mg/dL — ABNORMAL HIGH (ref 0.44–1.00)
GFR, EST AFRICAN AMERICAN: 47 mL/min — AB (ref >60)
GFR, EST NON AFRICAN AMERICAN: 41 mL/min — AB (ref >60)
GLUCOSE: 176 mg/dL — AB (ref 65–99)
POTASSIUM: 4.3 mmol/L (ref 3.5–5.1)
Sodium: 136 mmol/L (ref 135–145)

## 2018-02-21 LAB — CBC
HEMATOCRIT: 42 % (ref 36.0–46.0)
HEMOGLOBIN: 12.9 g/dL (ref 12.0–15.0)
MCH: 28.4 pg (ref 26.0–34.0)
MCHC: 30.7 g/dL (ref 30.0–36.0)
MCV: 92.5 fL (ref 78.0–100.0)
Platelets: 129 10*3/uL — ABNORMAL LOW (ref 150–400)
RBC: 4.54 MIL/uL (ref 3.87–5.11)
RDW: 15.3 % (ref 11.5–15.5)
WBC: 3.3 10*3/uL — ABNORMAL LOW (ref 4.0–10.5)

## 2018-02-21 LAB — TROPONIN I
Troponin I: 0.08 ng/mL (ref <0.03)
Troponin I: 0.08 ng/mL (ref <0.03)

## 2018-02-21 LAB — PROTIME-INR
INR: 3.19
Prothrombin Time: 32.4 seconds — ABNORMAL HIGH (ref 11.4–15.2)

## 2018-02-21 MED ORDER — ENSURE ENLIVE PO LIQD
237.0000 mL | Freq: Two times a day (BID) | ORAL | Status: DC
  Administered 2018-02-21: 237 mL via ORAL

## 2018-02-21 MED ORDER — ALBUTEROL SULFATE (2.5 MG/3ML) 0.083% IN NEBU: 2.5000 mg | INHALATION_SOLUTION | RESPIRATORY_TRACT | Status: DC | PRN

## 2018-02-21 MED ORDER — ALBUTEROL SULFATE (2.5 MG/3ML) 0.083% IN NEBU: 2.5000 mg | INHALATION_SOLUTION | Freq: Three times a day (TID) | RESPIRATORY_TRACT | Status: DC

## 2018-02-21 MED ORDER — GUAIFENESIN 100 MG/5ML PO SOLN
5.0000 mL | ORAL | Status: DC | PRN
  Administered 2018-02-21 – 2018-02-26 (×4): 100 mg via ORAL
  Filled 2018-02-21 (×4): qty 5

## 2018-02-21 MED ORDER — WARFARIN - PHARMACIST DOSING INPATIENT
Freq: Every day | Status: DC
  Administered 2018-02-25: 18:00:00

## 2018-02-21 MED ORDER — IPRATROPIUM-ALBUTEROL 0.5-2.5 (3) MG/3ML IN SOLN: 3.0000 mL | Freq: Three times a day (TID) | RESPIRATORY_TRACT | Status: DC

## 2018-02-21 MED ORDER — WARFARIN SODIUM 2.5 MG PO TABS
2.5000 mg | ORAL_TABLET | Freq: Once | ORAL | Status: AC
  Administered 2018-02-21: 2.5 mg via ORAL
  Filled 2018-02-21: qty 1

## 2018-02-21 MED ORDER — IPRATROPIUM-ALBUTEROL 0.5-2.5 (3) MG/3ML IN SOLN
3.0000 mL | Freq: Three times a day (TID) | RESPIRATORY_TRACT | Status: DC
  Administered 2018-02-21 – 2018-02-22 (×2): 3 mL via RESPIRATORY_TRACT
  Filled 2018-02-21 (×2): qty 3

## 2018-02-21 NOTE — Progress Notes (Signed)
SATURATION QUALIFICATIONS: (This note is used to comply with regulatory documentation for home oxygen)  Patient Saturations on Room Air at Rest = 91%  Patient Saturations on Room Air while Ambulating = 86%  Patient Saturations on 3 Liters of oxygen while Ambulating = 91%  Please briefly explain why patient needs home oxygen:desaturating with activity  Alben Deeds, PT DPT  Board Certified Neurologic Specialist [phone]

## 2018-02-21 NOTE — Progress Notes (Signed)
Troponin 0.08, was 0.09, no s/s, will continue to monitor, Thanks Buckner [place] .

## 2018-02-21 NOTE — Progress Notes (Signed)
[place] for coumadin Indication: atrial fibrillation  Allergies  Allergen Reactions  . Advair Diskus [Fluticasone-Salmeterol] Anaphylaxis  . Furosemide Shortness Of Breath    Tolerates torsemide  . Other Shortness Of Breath and Other (See Comments)    IODINATED CONTRAST MEDIA: Chills and fever also  . Salmeterol Anaphylaxis    THROAT CLOSES with Advair; tolerates formoterol and albuterol  . Iohexol Other (See Comments)    Desc:  Pt requires a 13 hr prep, when doing exam with contrast....fever, chills, dyspnea (slg/04/27/09--given 2 hour solumedrol/ benadryl prep--no reaction).    . Sulfonamide Derivatives Nausea And Vomiting  . Verapamil Hives, Nausea And Vomiting and Other (See Comments)    Loss of appetite also   . Adhesive [Tape] Other (See Comments)    TEARS and BRUISES SKIN  . Amoxicillin-Pot Clavulanate Nausea Only and Other (See Comments)    NAUSEA AND STOMACH PAIN  . Contrast Media [Iodinated Diagnostic Agents] Other (See Comments)    "Froze" and fever  . Ibuprofen Other (See Comments)    Has liver damage  . Levofloxacin Nausea Only and Other (See Comments)    NAUSEA AND STOMACH PAIN  . Macrobid [Nitrofurantoin Monohyd Macro] Nausea Only and Other (See Comments)    Severe stomach pain  . Latex Rash  . Penicillins Rash and Other (See Comments)    Has patient had a PCN reaction causing immediate rash, facial/tongue/throat swelling, SOB or lightheadedness with hypotension: Yes Has patient had a PCN reaction causing severe rash involving mucus membranes or skin necrosis: Unk Has patient had a PCN reaction that required hospitalization: Unk Has patient had a PCN reaction occurring within the last 10 years: No If all of the above answers are "NO", then may proceed with Cephalosporin use.     Vital Signs: Temp: 97.7 F (36.5 C) (04/07 0900) Temp Source: Oral (04/07 0900) BP: 152/81 (04/07 0900) Pulse Rate: 80 (04/07  0900)  Labs: Recent Labs    02/20/18 1604 02/20/18 1819 02/20/18 2322 02/21/18 0523  HGB 12.3  --   --  12.9  HCT 39.9  --   --  42.0  PLT 129*  --   --  129*  LABPROT 33.4*  --   --  32.4*  INR 3.32  --   --  3.19  CREATININE 1.15*  --   --  1.13*  TROPONINI 0.09* 0.08* 0.08* 0.08*    Estimated Creatinine Clearance: 25.7 mL/min (A) (by C-G formula based on SCr of 1.13 mg/dL (H)).   Medical History: Past Medical History:  Diagnosis Date  . AAA (abdominal aortic aneurysm) ([place])    a. 12/2011 U/S: 3.3 cm prox AAA.  [name] Anxiety   . Aortic insufficiency    a. S/P bioprosthetic AVR;  b. 02/2013 Echo: EF 50-55%, mild to mod AI (mean grad 65mmHg).  . Brain aneurysm    Hx of  . Chest pain    a. H/o nonobs cath;  b. 07/2013 Lexiscan MV: EF 66%, no ischemia.  . Chronic diastolic CHF (congestive heart failure) ([place])    a. 02/2013 Echo: EF 50-55%.  . Diverticulosis   . Gastritis   . Hemorrhoids   . Hepatitis B 1980's  . Hiatal hernia   . Hypertension   . Moderate mitral regurgitation    a. 02/2013 Echo: Mod MR.  . Moderate tricuspid regurgitation    a. 02/2013 Echo: Mod TR.  [name] Permanent atrial fibrillation ([place])    a. Chronic coumadin.  [name]  Polymyalgia rheumatica ([place])   . Pulmonary hypertension ([place])    a. 02/2013 Echo: PASP 63mmHg.  . S/P aortic valve replacement with bioprosthetic valve    a. [facility].  . S/P placement of cardiac pacemaker    a.  08/2007 s/p MDT [place] PPM.  . Seizures ([place])   . Shingles   . Skin cancer    face, ear  and left hand.  . Tachy-brady syndrome ([place])    a. Severe in the setting of permanent atrial fibrillation with tachybrady syndrome;  b. 08/2007 s/p MDT [place] PPM.  . Thoracic aneurysm    a. 10/2011 CTA: 5.0 x 3.8 cm.    Scheduled:  . albuterol  2.5 mg Nebulization Q4H  . feeding supplement (ENSURE ENLIVE)  237 mL Oral BID BM  . fluticasone  1 spray Each Nare Daily  . guaiFENesin  600 mg Oral BID  . insulin  aspart  0-5 Units Subcutaneous QHS  . insulin aspart  0-9 Units Subcutaneous TID WC  . latanoprost  1 drop Both Eyes QHS  . loratadine  10 mg Oral Daily  . LORazepam  0.5 mg Oral TID  . methylPREDNISolone (SOLU-MEDROL) injection  60 mg Intravenous Q12H  . mometasone-formoterol  2 puff Inhalation BID  . pantoprazole  40 mg Oral Daily  . PARoxetine  10 mg Oral Daily  . polyethylene glycol  17 g Oral Daily  . [START ON 02/22/2018] potassium chloride SA  20 mEq Oral Daily  . torsemide  60 mg Oral Daily  . traMADol  25 mg Oral QHS  . traZODone  75 mg Oral QHS    Assessment: 84 yo female here with SOB. She is on coumadin PTA for afib (also history of bioprosthetic AVR) and pharmacy consulted to dose.  -INR= 3.19 with trend down  Home dose: 2.5mg /day except 5mg  on TuSa (last clinic visit 02/08/18; INR= 2.8)  Goal of Therapy:  Goal INR 2.5- 3.0 (per clinic notes) Monitor platelets by anticoagulation protocol: Yes   Plan:  -Coumadin 2.5mg  po today -Daily PT/INR  Hildred Laser, PharmD Clinical Pharmacist 02/21/2018 10:46 AM

## 2018-02-21 NOTE — Progress Notes (Signed)
PROGRESS NOTE   [name]  RDE:[id number]    DOB: 03-21-1922    DOA: 02/20/2018  PCP: Jodi Marble, MD   I have briefly reviewed patients previous medical records in [facility].  Brief Narrative:  84 year old female, lives with her niece, PMH of AAA, bioprosthetic AVR, permanent pacemaker, chronic diastolic CHF, HTN, permanent A. fib on chronic Coumadin, PMR on chronic prednisone, recent hospitalization 02/12/18- 02/15/18 for acute on chronic diastolic CHF and acute hypoxic respiratory failure presented to ED on 02/20/18 with worsening dyspnea, 3 pound weight gain, dry cough but no chest pain, leg edema or fevers and admitted for acute hypoxic respiratory failure likely related to COPD exacerbation +/- decompensated CHF.  Improving.   Assessment & Plan:   Principal Problem:   Acute respiratory failure with hypoxia (HCC) Active Problems:   DIASTOLIC HEART FAILURE, CHRONIC   PACEMAKER-Medtronic   Hypertension   Hyperglycemia   COPD exacerbation: Chest x-ray without pneumonia.  Continue bronchodilator nebulizations, Flonase, Solu-Medrol 60 mg every 12 hourly, Dulera and empiric azithromycin.  Add flutter valve.  Improving.  Likely precipitated by acute viral bronchitis.  Acute on chronic diastolic CHF: Mild.  Chest x-ray suggest mild pulmonary edema but clinically not much volume overload.  Allergic to Lasix and hence placed on increased dose of torsemide 60 mg orally daily.  TTE 60-65%.  Acute hypoxic respiratory failure: Not on home oxygen.  Secondary to decompensated CHF and COPD exacerbation.  Treatment as above and wean oxygen as tolerated.  Elevated troponin: Mild and flat trend.  No chest pain.  Likely demand ischemia related to acute respiratory illness.  Permanent atrial fibrillation/PPM: Sinus rhythm or V paced on monitor.  INR supratherapeutic/3.19.  Coumadin per pharmacy.  Hypokalemia: Replaced.  Thrombocytopenia: Stable.  Hyperglycemia: Worsened by steroids.   Continue SSI.  A1c 6 on 02/14/18.  PMR: On chronic prednisone 7.5 mg daily.  After steroid taper is completed, needs to return to her maintenance dose of steroids.  Mechanical fall: Reports a mechanical fall prior to recent admission.  PT evaluation.   DVT prophylaxis: Per therapeutic INR on Coumadin. Code Status: DNR Family Communication: None at bedside Disposition: DC home pending clinical improvement and PT evaluation.   Consultants:  None  Procedures:  None  Antimicrobials:  Azithromycin   Subjective: Overall feels much better.  Improved dyspnea.  Minimal dry intermittent cough.  No chest pain.  Uncomfortable in bed and wants to get out of bed.  No dizziness or lightheadedness reported.  As per RN, no acute issues noted.  ROS: As above.  Objective:  Vitals:   02/21/18 0759 02/21/18 0840 02/21/18 0900 02/21/18 1146  BP:  (!) 143/77 (!) 152/81   Pulse:  77 80   Resp:  18 18   Temp:   97.7 F (36.5 C)   TempSrc:   Oral   SpO2: 96% 96% 95% 97%  Weight:        Examination:  General exam: Pleasant elderly female, small built and frail, lying comfortably propped up in bed. Respiratory system: Breath sounds bilaterally with scattered few bilateral expiratory rhonchi.  Occasional basal crackles. Respiratory effort normal. Cardiovascular system: S1 & S2 heard, RRR.  Mild JVD.  Trace ankle edema.  No murmurs.  Telemetry personally reviewed: V paced rhythm/SR. Gastrointestinal system: Abdomen is nondistended, soft and nontender. No organomegaly or masses felt. Normal bowel sounds heard. Central nervous system: Alert and oriented. No focal neurological deficits. Extremities: Symmetric 5 x 5 power. Skin: No rashes,  lesions or ulcers Psychiatry: Judgement and insight appear normal. Mood & affect appropriate.     Data Reviewed: I have personally reviewed following labs and imaging studies  CBC: Recent Labs  Lab 02/20/18 1604 02/21/18 0523  WBC 4.3 3.3*  NEUTROABS  3.5  --   HGB 12.3 12.9  HCT 39.9 42.0  MCV 92.8 92.5  PLT 129* 409*   Basic Metabolic Panel: Recent Labs  Lab 02/15/18 0255 02/20/18 1604 02/20/18 1819 02/21/18 0523  NA 140 137  --  136  K 3.8 3.4*  --  4.3  CL 95* 94*  --  95*  CO2 36* 28  --  25  GLUCOSE 129* 185*  --  176*  BUN 17 23*  --  27*  CREATININE 1.02* 1.15*  --  1.13*  CALCIUM 8.8* 8.9  --  8.8*  MG  --   --  1.9  --    Coagulation Profile: Recent Labs  Lab 02/15/18 0255 02/20/18 1604 02/21/18 0523  INR 1.89 3.32 3.19   Cardiac Enzymes: Recent Labs  Lab 02/20/18 1604 02/20/18 1819 02/20/18 2322 02/21/18 0523  TROPONINI 0.09* 0.08* 0.08* 0.08*   CBG: Recent Labs  Lab 02/15/18 1127 02/20/18 2122 02/21/18 0050 02/21/18 0719 02/21/18 1155  GLUCAP 261* 260* 229* 151* 178*    No results found for this or any previous visit (from the past 240 hour(s)).       Radiology Studies: Dg Chest 2 View  Result Date: 02/20/2018 CLINICAL DATA:  Pt complains of SOB x 2 weeks with occasional generalized CP; also reports "terrible" cough but unsure of timeframe; emesis x 2 today; pt here within the last week and dx w/ CHF per ED notes; hx of HTN treatment and pacemaker insertion; non-smoker; chart also shows hx of AAA EXAM: CHEST - 2 VIEW COMPARISON:  02/12/2018 FINDINGS: Cardiac silhouette is enlarged. There stable changes from prior cardiac surgery and valve replacement. There is significant enlargement of the thoracic aorta, particularly the aortic knob, stable from the prior study. No mediastinal or hilar masses. Small bilateral pleural effusions. Lungs demonstrate thickened bronchovascular markings. Small area of focal opacity noted in the right mid lung likely atelectasis or fluid along the minor fissure. No pneumothorax. Findings are without significant change from the most recent prior chest radiograph. Left anterior chest wall single lead pacemaker is stable. IMPRESSION: 1. Cardiomegaly, prominent  bronchovascular markings and small pleural effusions. Suspect mild congestive heart failure. Findings are stable when compared to the most recent prior chest radiograph. 2. Thoracic aortic aneurysm. Electronically Signed   By: Lajean Manes M.D.   On: 02/20/2018 15:49        Scheduled Meds: . albuterol  2.5 mg Nebulization TID  . feeding supplement (ENSURE ENLIVE)  237 mL Oral BID BM  . fluticasone  1 spray Each Nare Daily  . guaiFENesin  600 mg Oral BID  . insulin aspart  0-5 Units Subcutaneous QHS  . insulin aspart  0-9 Units Subcutaneous TID WC  . latanoprost  1 drop Both Eyes QHS  . loratadine  10 mg Oral Daily  . LORazepam  0.5 mg Oral TID  . methylPREDNISolone (SOLU-MEDROL) injection  60 mg Intravenous Q12H  . mometasone-formoterol  2 puff Inhalation BID  . pantoprazole  40 mg Oral Daily  . PARoxetine  10 mg Oral Daily  . polyethylene glycol  17 g Oral Daily  . [START ON 02/22/2018] potassium chloride SA  20 mEq Oral Daily  . torsemide  60 mg Oral Daily  . traMADol  25 mg Oral QHS  . traZODone  75 mg Oral QHS  . warfarin  2.5 mg Oral ONCE-1800  . Warfarin - Pharmacist Dosing Inpatient   Does not apply q1800   Continuous Infusions: . azithromycin       LOS: 1 day     Vernell Leep, MD, FACP, [facility]. [facility] Pager [phone]  If 7PM-7AM, please contact night-coverage [email or web address] Password [facility] 02/21/2018, 12:18 PM

## 2018-02-21 NOTE — Evaluation (Signed)
Physical Therapy Evaluation Patient Details Name: [name] MRN: [id number] DOB: 1921/11/28 Today's Date: 02/21/2018   History of Present Illness  84 y.o. female admitted for SOB,productive cough, and wheezing. Pt was recently hospitalized for CHF exacerbation (d/c 02/15/18). Pt found to have acute hypoxic respiratory failure with COPD exacerbation. PMH includes but not limited to AAA, pacemaker, tachy brady syndrome, AVR, PVD, chronic diastolic CHF, and chronic a fib. She has a history of falls.  Clinical Impression  Orders received for PT evaluation. Patient demonstrates deficits in functional mobility as indicated below. Will benefit from continued skilled PT to address deficits and maximize function. Will see as indicated and progress as tolerated.  Patient ambulated in room with desaturation into the mid 80s (86%) and symptomatic dizziness and 3/4 DOE. Replaced 3 liters [place] with improvements to 93%. From previous admission, patient has a lot of assist at home, however, if family is unable to provide adequate level of assist then recommend SNF.     Follow Up Recommendations Home health PT;Supervision for mobility/OOB(vs SNF )    Equipment Recommendations  None recommended by PT    Recommendations for Other Services       Precautions / Restrictions Precautions Precautions: Fall Restrictions Weight Bearing Restrictions: No      Mobility  Bed Mobility               General bed mobility comments: received in chair  Transfers Overall transfer level: Needs assistance Equipment used: 4-wheeled walker Transfers: Sit to/from Stand Sit to Stand: Mod assist;Min assist         General transfer comment: Assist to scoot to edge of chair with VCs for positioning and hand placement, increased assist to power up to stand withuse of momentum.   Ambulation/Gait Ambulation/Gait assistance: Min assist Ambulation Distance (Feet): 30 Feet Assistive device: 4-wheeled walker Gait  Pattern/deviations: Step-through pattern;Decreased step length - right;Decreased step length - left;Trunk flexed     General Gait Details: assist for stability, limited activity tolerance. Ambulated on room air with desaturation to 86% and symptomatic.  Stairs            Wheelchair Mobility    Modified Rankin (Stroke Patients Only)       Balance Overall balance assessment: Needs assistance Sitting-balance support: No upper extremity supported;Feet supported Sitting balance-Leahy Scale: Fair     Standing balance support: Bilateral upper extremity supported Standing balance-Leahy Scale: Poor Standing balance comment: rollator and min guard for static standing, assist for any dynamic activity                             Pertinent Vitals/Pain Pain Assessment: Faces Faces Pain Scale: Hurts little more Pain Location: chronic knee pain Pain Descriptors / Indicators: Grimacing    Home Living Family/patient expects to be discharged to:: Private residence Living Arrangements: Children Available Help at Discharge: Family;Personal care attendant;Available 24 hours/day Type of Home: House Home Access: Elevator     Home Layout: Two level;Able to live on main level with bedroom/bathroom Home Equipment: Walker - 4 wheels;Walker - 2 wheels;Wheelchair - [employer];Shower seat Additional Comments: lives with niece    Prior Function Level of Independence: Needs assistance   Gait / Transfers Assistance Needed: Pt reports she amb with rollator and assistance  ADL's / Homemaking Assistance Needed: assist for bathing and dressing  Comments: uses walker, walks home distance, WC for community, family does homemaking     Hand Dominance  Extremity/Trunk Assessment   Upper Extremity Assessment Upper Extremity Assessment: Generalized weakness    Lower Extremity Assessment Lower Extremity Assessment: Generalized weakness       Communication    Communication: No difficulties  Cognition Arousal/Alertness: Awake/alert Behavior During Therapy: WFL for tasks assessed/performed Overall Cognitive Status: Within Functional Limits for tasks assessed                                        General Comments      Exercises     Assessment/Plan    PT Assessment Patient needs continued PT services  PT Problem List Decreased strength;Decreased activity tolerance;Decreased balance;Decreased mobility       PT Treatment Interventions DME instruction;Gait training;Functional mobility training;Therapeutic activities;Therapeutic exercise;Balance training;Patient/family education    PT Goals (Current goals can be found in the Care Plan section)  Acute Rehab PT Goals Patient Stated Goal: return home PT Goal Formulation: With patient/family Time For Goal Achievement: 03/07/18 Potential to Achieve Goals: Good    Frequency Min 3X/week   Barriers to discharge        Co-evaluation               AM-PAC PT "6 Clicks" Daily Activity  Outcome Measure Difficulty turning over in bed (including adjusting bedclothes, sheets and blankets)?: A Little Difficulty moving from lying on back to sitting on the side of the bed? : Unable Difficulty sitting down on and standing up from a chair with arms (e.g., wheelchair, bedside commode, etc,.)?: Unable Help needed moving to and from a bed to chair (including a wheelchair)?: A Little Help needed walking in hospital room?: A Little Help needed climbing 3-5 steps with a railing? : A Lot 6 Click Score: 13    End of Session Equipment Utilized During Treatment: Gait belt Activity Tolerance: Patient tolerated treatment well Patient left: in chair;with call bell/phone within reach;with chair alarm set Nurse Communication: Mobility status;Other (comment) PT Visit Diagnosis: Unsteadiness on feet (R26.81);History of falling (Z91.81);Muscle weakness (generalized) (M62.81)    Time:  5465-0354 PT Time Calculation (min) (ACUTE ONLY): 17 min   Charges:   PT Evaluation $PT Eval Moderate Complexity: 1 Mod     PT G Codes:        Alben Deeds, PT DPT  Board Certified Neurologic Specialist [place] 02/21/2018, 2:40 PM

## 2018-02-22 ENCOUNTER — Ambulatory Visit: Payer: Medicare Other | Admitting: Cardiovascular Disease

## 2018-02-22 DIAGNOSIS — J9621 Acute and chronic respiratory failure with hypoxia: Secondary | ICD-10-CM

## 2018-02-22 LAB — BASIC METABOLIC PANEL
ANION GAP: 14 (ref 5–15)
BUN: 38 mg/dL — ABNORMAL HIGH (ref 6–20)
CALCIUM: 8.7 mg/dL — AB (ref 8.9–10.3)
CO2: 28 mmol/L (ref 22–32)
CREATININE: 1.25 mg/dL — AB (ref 0.44–1.00)
Chloride: 94 mmol/L — ABNORMAL LOW (ref 101–111)
GFR calc Af Amer: 42 mL/min — ABNORMAL LOW (ref >60)
GFR, EST NON AFRICAN AMERICAN: 36 mL/min — AB (ref >60)
GLUCOSE: 179 mg/dL — AB (ref 65–99)
Potassium: 3.9 mmol/L (ref 3.5–5.1)
Sodium: 136 mmol/L (ref 135–145)

## 2018-02-22 LAB — CBC
HCT: 41.7 % (ref 36.0–46.0)
Hemoglobin: 12.8 g/dL (ref 12.0–15.0)
MCH: 28.3 pg (ref 26.0–34.0)
MCHC: 30.7 g/dL (ref 30.0–36.0)
MCV: 92.3 fL (ref 78.0–100.0)
Platelets: 130 10*3/uL — ABNORMAL LOW (ref 150–400)
RBC: 4.52 MIL/uL (ref 3.87–5.11)
RDW: 15.2 % (ref 11.5–15.5)
WBC: 4.7 10*3/uL (ref 4.0–10.5)

## 2018-02-22 LAB — PROTIME-INR
INR: 3.34
Prothrombin Time: 33.6 seconds — ABNORMAL HIGH (ref 11.4–15.2)

## 2018-02-22 LAB — GLUCOSE, CAPILLARY
GLUCOSE-CAPILLARY: 110 mg/dL — AB (ref 65–99)
Glucose-Capillary: 179 mg/dL — ABNORMAL HIGH (ref 65–99)
Glucose-Capillary: 205 mg/dL — ABNORMAL HIGH (ref 65–99)
Glucose-Capillary: 264 mg/dL — ABNORMAL HIGH (ref 65–99)

## 2018-02-22 MED ORDER — GLUCERNA SHAKE PO LIQD
237.0000 mL | Freq: Three times a day (TID) | ORAL | Status: DC
  Administered 2018-02-22 – 2018-02-26 (×14): 237 mL via ORAL

## 2018-02-22 MED ORDER — TORSEMIDE 20 MG PO TABS: 40.0000 mg | ORAL_TABLET | Freq: Every day | ORAL | Status: DC

## 2018-02-22 MED ORDER — METHYLPREDNISOLONE SODIUM SUCC 40 MG IJ SOLR
40.0000 mg | Freq: Two times a day (BID) | INTRAMUSCULAR | Status: DC
  Administered 2018-02-22 – 2018-02-25 (×6): 40 mg via INTRAVENOUS
  Filled 2018-02-22 (×6): qty 1

## 2018-02-22 MED ORDER — WARFARIN SODIUM 1 MG PO TABS
1.0000 mg | ORAL_TABLET | Freq: Once | ORAL | Status: AC
  Administered 2018-02-22: 1 mg via ORAL
  Filled 2018-02-22: qty 1

## 2018-02-22 MED ORDER — ORAL CARE MOUTH RINSE
15.0000 mL | Freq: Two times a day (BID) | OROMUCOSAL | Status: DC
  Administered 2018-02-22 – 2018-02-25 (×8): 15 mL via OROMUCOSAL

## 2018-02-22 MED ORDER — TORSEMIDE 20 MG PO TABS
20.0000 mg | ORAL_TABLET | Freq: Every day | ORAL | Status: DC
  Administered 2018-02-22 – 2018-02-23 (×2): 20 mg via ORAL
  Filled 2018-02-22 (×2): qty 1

## 2018-02-22 MED ORDER — IPRATROPIUM-ALBUTEROL 0.5-2.5 (3) MG/3ML IN SOLN
3.0000 mL | Freq: Two times a day (BID) | RESPIRATORY_TRACT | Status: DC
  Administered 2018-02-22 – 2018-02-26 (×8): 3 mL via RESPIRATORY_TRACT
  Filled 2018-02-22 (×8): qty 3

## 2018-02-22 NOTE — Progress Notes (Addendum)
PROGRESS NOTE   [name]  TKW:[id number]    DOB: 29-Aug-1922    DOA: 02/20/2018  PCP: Jodi Marble, MD   I have briefly reviewed patients previous medical records in [facility].  Brief Narrative:  84 year old female, lives with her niece, PMH of AAA, bioprosthetic AVR, permanent pacemaker, chronic diastolic CHF, HTN, permanent A. fib on chronic Coumadin, PMR on chronic prednisone, recent hospitalization 02/12/18- 02/15/18 for acute on chronic diastolic CHF and acute hypoxic respiratory failure presented to ED on 02/20/18 with worsening dyspnea, 3 pound weight gain, dry cough but no chest pain, leg edema or fevers and admitted for acute hypoxic respiratory failure likely related to COPD exacerbation +/- decompensated CHF.  Improving.   Assessment & Plan:   Principal Problem:   Acute respiratory failure with hypoxia (HCC) Active Problems:   DIASTOLIC HEART FAILURE, CHRONIC   PACEMAKER-Medtronic   Hypertension   Hyperglycemia   COPD exacerbation: Chest x-ray without pneumonia.  Continue bronchodilator nebulizations, Flonase, Dulera and empiric azithromycin.  Add flutter valve. Likely precipitated by acute viral bronchitis >her niece apparently is also sick with the flu.  Improving.  Reduced IV Solu-Medrol to 40 mg every 12 hours. OP f/u with primary pulmonologist at [place].  Acute on chronic diastolic CHF: Mild.  Chest x-ray suggested mild pulmonary edema but clinically not much volume overload.  Allergic to Lasix and hence placed on increased dose of torsemide 60 mg orally daily.  TTE 60-65%.  Now appears euvolemic and creatinine has increased from 1.13-1.25.  Reduced torsemide to 20 mg daily and pending improvement in creatinine, may go back on home dose 40 mg daily.  Acute on chronic hypoxic respiratory failure: Not on home oxygen.  Secondary to decompensated CHF and COPD exacerbation.  Treatment as above and wean oxygen as tolerated.  Need to assess home oxygen requirement prior  to discharge.   As per extensive discussion with niece on 4/8, patient apparently desaturates at night while asleep up to 83%.  Patient follows up with Dr. Tawny Hopping, [facility] in [place] and is awaiting to do a sleep study.  Discussed with RN, wean to room air if saturations >88%, continuous pulse oximetry overnight and document oxygen saturations to see if she has episodes of hypoxia at night and also check desaturation protocol on day of discharge.  Elevated troponin: Mild and flat trend.  No chest pain.  Likely demand ischemia related to acute respiratory illness.  Permanent atrial fibrillation/PPM: Sinus rhythm or V paced on monitor.  INR supratherapeutic/3.34.  Coumadin per pharmacy.  Hypokalemia: Replaced.  Thrombocytopenia: Stable.  Hyperglycemia: Worsened by steroids.  Continue SSI.  A1c 6 on 02/14/18.  PMR: On chronic prednisone 7.5 mg daily.  After steroid taper is completed, needs to return to her maintenance dose of steroids.  Mechanical fall: Reports a mechanical fall prior to recent admission.  PT evaluated and recommend home health PT.  ? OSA> needs OP evaluation   DVT prophylaxis: Supra therapeutic INR on Coumadin. Code Status: DNR Family Communication: Discussed with niece via phone, updated care and answered questions. Disposition: DC home pending clinical improvement, possibly in the next 24-48 hours.   Consultants:  None  Procedures:  None  Antimicrobials:  Azithromycin   Subjective: Overall continues to feel better.  Dyspnea much improved.  Cough improved, minimal and intermittent dry.  No chest pain.  ROS: As above.  Objective:  Vitals:   02/22/18 0531 02/22/18 0825 02/22/18 0832 02/22/18 1220  BP: (!) 141/94  133/69  Pulse: 65   76  Resp: 18     Temp: 97.8 F (36.6 C)   (!) 97.5 F (36.4 C)  TempSrc: Oral   Oral  SpO2: 98% 98% 98% 98%  Weight: 61.7 kg (136 lb)       Examination:  General exam: Pleasant elderly  female, small built and frail, lying comfortably propped up in bed. Respiratory system: Improved breath sounds.  Still slightly harsh.  Occasional rhonchi but much better than yesterday.  No crackles.  No increased work of breathing.  Midline sternotomy scar. Cardiovascular system: S1 & S2 heard, RRR.  Mild JVD.  Trace ankle edema.  Systolic murmur 3/6 best heard at apex.  Telemetry personally reviewed: V paced rhythm. Gastrointestinal system: Abdomen is nondistended, soft and nontender. No organomegaly or masses felt. Normal bowel sounds heard.  Uncomplicated ventral hernia. Central nervous system: Alert and oriented. No focal neurological deficits.  Stable. Extremities: Symmetric 5 x 5 power. Skin: No rashes, lesions or ulcers Psychiatry: Judgement and insight appear normal. Mood & affect appropriate.     Data Reviewed: I have personally reviewed following labs and imaging studies  CBC: Recent Labs  Lab 02/20/18 1604 02/21/18 0523 02/22/18 0505  WBC 4.3 3.3* 4.7  NEUTROABS 3.5  --   --   HGB 12.3 12.9 12.8  HCT 39.9 42.0 41.7  MCV 92.8 92.5 92.3  PLT 129* 129* 614*   Basic Metabolic Panel: Recent Labs  Lab 02/20/18 1604 02/20/18 1819 02/21/18 0523 02/22/18 0505  NA 137  --  136 136  K 3.4*  --  4.3 3.9  CL 94*  --  95* 94*  CO2 28  --  25 28  GLUCOSE 185*  --  176* 179*  BUN 23*  --  27* 38*  CREATININE 1.15*  --  1.13* 1.25*  CALCIUM 8.9  --  8.8* 8.7*  MG  --  1.9  --   --    Coagulation Profile: Recent Labs  Lab 02/20/18 1604 02/21/18 0523 02/22/18 0505  INR 3.32 3.19 3.34   Cardiac Enzymes: Recent Labs  Lab 02/20/18 1604 02/20/18 1819 02/20/18 2322 02/21/18 0523  TROPONINI 0.09* 0.08* 0.08* 0.08*   CBG: Recent Labs  Lab 02/21/18 1155 02/21/18 1609 02/21/18 2104 02/22/18 1003 02/22/18 1216  GLUCAP 178* 241* 90 179* 205*    No results found for this or any previous visit (from the past 240 hour(s)).       Radiology Studies: No results  found.      Scheduled Meds: . feeding supplement (GLUCERNA SHAKE)  237 mL Oral TID BM  . fluticasone  1 spray Each Nare Daily  . guaiFENesin  600 mg Oral BID  . insulin aspart  0-5 Units Subcutaneous QHS  . insulin aspart  0-9 Units Subcutaneous TID WC  . ipratropium-albuterol  3 mL Nebulization BID  . latanoprost  1 drop Both Eyes QHS  . loratadine  10 mg Oral Daily  . LORazepam  0.5 mg Oral TID  . mouth rinse  15 mL Mouth Rinse BID  . methylPREDNISolone (SOLU-MEDROL) injection  40 mg Intravenous Q12H  . mometasone-formoterol  2 puff Inhalation BID  . pantoprazole  40 mg Oral Daily  . PARoxetine  10 mg Oral Daily  . polyethylene glycol  17 g Oral Daily  . potassium chloride SA  20 mEq Oral Daily  . torsemide  20 mg Oral Daily  . traMADol  25 mg Oral QHS  . traZODone  75 mg Oral QHS  . warfarin  1 mg Oral ONCE-1800  . Warfarin - Pharmacist Dosing Inpatient   Does not apply q1800   Continuous Infusions: . azithromycin Stopped (02/21/18 1645)     LOS: 2 days     Vernell Leep, MD, FACP, [facility] Pager [phone]  If 7PM-7AM, please contact night-coverage [email or web address] Password [facility] 02/22/2018, 4:15 PM

## 2018-02-22 NOTE — Care Management Note (Signed)
Case Management Note  Patient Details  Name: [name] MRN: [id number] Date of Birth: 12/30/1921  Subjective/Objective:    Acute Resp Failure               Action/Plan: Patient lives at home with her niece; PCP: Jodi Marble, MD; has private insurance with Medicare/ AARP with prescription drug coverage; pharmacy of choice is [facility]; patient is active with [facility] as prior to admission; DME - wheelchair, walker at home; CM will continue to follow for progression of care.  Expected Discharge Date:    possibly 02/24/2018              Expected Discharge Plan:  [place]  Discharge planning Services  CM Consult  Choice offered to:  Patient  HH Arranged:  RN, Disease Management, PT, Nurse's Aide [place] Agency:  [place]  Status of Service:  In process, will continue to follow  Sherrilyn Rist [phone] 02/22/2018, 10:45 AM

## 2018-02-22 NOTE — Progress Notes (Addendum)
Initial Nutrition Assessment  DOCUMENTATION CODES:   Not applicable  INTERVENTION:  Continue Glucerna Shake po TID, each supplement provides 220 kcal and 10 grams of protein Magic cup BID with meals, each supplement provides 290 kcal and 9 grams of protein (pt doesn't like chocolate) MVI with minerals - daily  NUTRITION DIAGNOSIS:   Increased nutrient needs related to chronic illness(CHF, COPD) as evidenced by estimated needs.  GOAL:   Patient will meet greater than or equal to 90% of their needs  MONITOR:   PO intake, Supplement acceptance, Labs, I & O's, Skin, Weight trends  REASON FOR ASSESSMENT:   Malnutrition Screening Tool    ASSESSMENT:   84 y.o. F admitted for acute respiratory failure with hypoxia. PMH of COPD, a.fib, tachy-brady syndrome, pacemaker, hypertension, aneurysms (brain, thoracic, abdominal), aortic insufficiency, CHF, diverticulosis, hemorrhoids, gastritis, hepatitis B, skin cancer, seizures, hiatal hernia,and polymyalgia rheumatica. PSH of cholecystectomy.   Pt was a poor historian and pt niece was not at bedside. Pt reports that her niece takes care of her. Recently pt reports eating three meals a day with less than a fistful at each meal. For breakfast pt will typically have either some cereal or a shake, for lunch she will typically have some chicken salad, and for dinner she will typically have a meat and a vegetable. Pt reports that her tastes have changed and that she doesn't like the things she used to eat; pt unable to report on intake prior to her decreased appetite and weight loss. Pt also reports that it is hard for her to chew and swallow.   Pt reports weight loss, but is unsure of the timeframe; at some point around Christmas. Pt is unsure about how much weight she lost (uable to even give a range) and when asked if her clothes fit differently she said yes; she mentioned she lost some clothing sizes, but couldn't verify how many. Pt seemed unsure  about her UBW and could not report on any weights in her past except for her current weight which she reports as 136 lbs. Unable to retrieve bed weight.   Suspect malnutrition, but unable to identify at this time.   Current supplements: Glucerna Shake po TID, each supplement provides 220 kcal and 10 grams of protein  Medications reviewed: novolog 0-5 & 0-9 units, solu-medrol, protonix, potasium chloride, demadex, desyrel, warfarin.   Labs reviewed: Cl 94 (L), BG 179 (H), BUN 38 (H), creatinine 1.25 (H), GFR 42 (L), platelets 130 (L), PT 33.6 (H).    Intake/Output Summary (Last 24 hours) at 02/22/2018 1308 Last data filed at 02/22/2018 1000 Gross per 24 hour  Intake 645 ml  Output 1726 ml  Net -1081 ml    Lab Results  Component Value Date   HGBA1C 6.0 (H) 02/14/2018     NUTRITION - FOCUSED PHYSICAL EXAM:  Unsure if muscle and fat wasting due to sarcopenia.     Most Recent Value  Orbital Region  Mild depletion  Upper Arm Region  Moderate depletion  Thoracic and Lumbar Region  Moderate depletion  Buccal Region  Mild depletion  Temple Region  Mild depletion  Clavicle Bone Region  Moderate depletion  Clavicle and Acromion Bone Region  Moderate depletion  Scapular Bone Region  Unable to assess  Dorsal Hand  Moderate depletion  Patellar Region  Mild depletion  Anterior Thigh Region  Mild depletion  Posterior Calf Region  Mild depletion  Edema (RD Assessment)  Mild  Hair  Reviewed  Eyes  Reviewed  Mouth  Reviewed  Skin  Reviewed  Nails  Reviewed       Diet Order:  Diet heart healthy/carb modified Room service appropriate? Yes; Fluid consistency: Thin  EDUCATION NEEDS:   Education needs have been addressed(discussed with pt that her energy and protein needs have increased due to her CHF and COPD)  Skin:  Skin Assessment: Skin Integrity Issues: Skin Integrity Issues:: Other (Comment) Other: ecchymosis L/R arm, skin tear R arm  Last BM:  02/19/18  Height:   Ht  Readings from Last 1 Encounters:  02/12/18 5\' 3"  (1.6 m)    Weight:   Wt Readings from Last 1 Encounters:  02/22/18 136 lb (61.7 kg)   UBW: 140 lbs % UBW: 97%  Ideal Body Weight:  52.27 kg  BMI:  Body mass index is 24.09 kg/m.  Estimated Nutritional Needs:   Kcal:  1300-1500 kcal  Protein:  65-75 grams  Fluid:  1.3-1.5 L or per MD    Hope Budds, Dietetic Intern

## 2018-02-22 NOTE — Progress Notes (Addendum)
[place] for coumadin Indication: atrial fibrillation  Allergies  Allergen Reactions  . Advair Diskus [Fluticasone-Salmeterol] Anaphylaxis  . Furosemide Shortness Of Breath    Tolerates torsemide  . Other Shortness Of Breath and Other (See Comments)    IODINATED CONTRAST MEDIA: Chills and fever also  . Salmeterol Anaphylaxis    THROAT CLOSES with Advair; tolerates formoterol and albuterol  . Iohexol Other (See Comments)    Desc:  Pt requires a 13 hr prep, when doing exam with contrast....fever, chills, dyspnea (slg/04/27/09--given 2 hour solumedrol/ benadryl prep--no reaction).    . Sulfonamide Derivatives Nausea And Vomiting  . Verapamil Hives, Nausea And Vomiting and Other (See Comments)    Loss of appetite also   . Adhesive [Tape] Other (See Comments)    TEARS and BRUISES SKIN  . Amoxicillin-Pot Clavulanate Nausea Only and Other (See Comments)    NAUSEA AND STOMACH PAIN  . Contrast Media [Iodinated Diagnostic Agents] Other (See Comments)    "Froze" and fever  . Ibuprofen Other (See Comments)    Has liver damage  . Levofloxacin Nausea Only and Other (See Comments)    NAUSEA AND STOMACH PAIN  . Macrobid [Nitrofurantoin Monohyd Macro] Nausea Only and Other (See Comments)    Severe stomach pain  . Latex Rash  . Penicillins Rash and Other (See Comments)    Has patient had a PCN reaction causing immediate rash, facial/tongue/throat swelling, SOB or lightheadedness with hypotension: Yes Has patient had a PCN reaction causing severe rash involving mucus membranes or skin necrosis: Unk Has patient had a PCN reaction that required hospitalization: Unk Has patient had a PCN reaction occurring within the last 10 years: No If all of the above answers are "NO", then may proceed with Cephalosporin use.     Vital Signs: Temp: 97.5 F (36.4 C) (04/08 1220) Temp Source: Oral (04/08 1220) BP: 133/69 (04/08 1220) Pulse Rate: 76 (04/08  1220)  Labs: Recent Labs    02/20/18 1604 02/20/18 1819 02/20/18 2322 02/21/18 0523 02/22/18 0505  HGB 12.3  --   --  12.9 12.8  HCT 39.9  --   --  42.0 41.7  PLT 129*  --   --  129* 130*  LABPROT 33.4*  --   --  32.4* 33.6*  INR 3.32  --   --  3.19 3.34  CREATININE 1.15*  --   --  1.13* 1.25*  TROPONINI 0.09* 0.08* 0.08* 0.08*  --     Estimated Creatinine Clearance: 23.3 mL/min (A) (by C-G formula based on SCr of 1.25 mg/dL (H)).   Medical History: Past Medical History:  Diagnosis Date  . AAA (abdominal aortic aneurysm) ([place])    a. 12/2011 U/S: 3.3 cm prox AAA.  [name] Anxiety   . Aortic insufficiency    a. S/P bioprosthetic AVR;  b. 02/2013 Echo: EF 50-55%, mild to mod AI (mean grad 71mmHg).  . Brain aneurysm    Hx of  . Chest pain    a. H/o nonobs cath;  b. 07/2013 Lexiscan MV: EF 66%, no ischemia.  . Chronic diastolic CHF (congestive heart failure) ([place])    a. 02/2013 Echo: EF 50-55%.  . Diverticulosis   . Gastritis   . Hemorrhoids   . Hepatitis B 1980's  . Hiatal hernia   . Hypertension   . Moderate mitral regurgitation    a. 02/2013 Echo: Mod MR.  . Moderate tricuspid regurgitation    a. 02/2013 Echo: Mod TR.  [name]  Permanent atrial fibrillation ([place])    a. Chronic coumadin.  . Polymyalgia rheumatica ([place])   . Pulmonary hypertension ([place])    a. 02/2013 Echo: PASP 18mmHg.  . S/P aortic valve replacement with bioprosthetic valve    a. [facility].  . S/P placement of cardiac pacemaker    a.  08/2007 s/p MDT [place] PPM.  . Seizures ([place])   . Shingles   . Skin cancer    face, ear  and left hand.  . Tachy-brady syndrome ([place])    a. Severe in the setting of permanent atrial fibrillation with tachybrady syndrome;  b. 08/2007 s/p MDT [place] PPM.  . Thoracic aneurysm    a. 10/2011 CTA: 5.0 x 3.8 cm.    Scheduled:  . feeding supplement (GLUCERNA SHAKE)  237 mL Oral TID BM  . fluticasone  1 spray Each Nare Daily  . guaiFENesin  600 mg Oral BID   . insulin aspart  0-5 Units Subcutaneous QHS  . insulin aspart  0-9 Units Subcutaneous TID WC  . ipratropium-albuterol  3 mL Nebulization BID  . latanoprost  1 drop Both Eyes QHS  . loratadine  10 mg Oral Daily  . LORazepam  0.5 mg Oral TID  . mouth rinse  15 mL Mouth Rinse BID  . methylPREDNISolone (SOLU-MEDROL) injection  40 mg Intravenous Q12H  . mometasone-formoterol  2 puff Inhalation BID  . pantoprazole  40 mg Oral Daily  . PARoxetine  10 mg Oral Daily  . polyethylene glycol  17 g Oral Daily  . potassium chloride SA  20 mEq Oral Daily  . torsemide  20 mg Oral Daily  . traMADol  25 mg Oral QHS  . traZODone  75 mg Oral QHS  . Warfarin - Pharmacist Dosing Inpatient   Does not apply q1800    Assessment: 84 yo female here with SOB. She is on coumadin PTA for afib (also history of bioprosthetic AVR) and pharmacy consulted to dose.   -INR= 3.34 with INR remaining slightly above goal. The effects from the warfarin 5mg  dose over the weekend should stop soon. Will give reduced dose tonight to prevent gaps in anticoagulation therapy. Noted interaction with azithromycin.  Home dose: 2.5mg /day except 5mg  on TuSa (last clinic visit 02/08/18; INR= 2.8)  Goal of Therapy:  Goal INR 2.5- 3.0 (per clinic notes) Monitor platelets by anticoagulation protocol: Yes   Plan:  -Coumadin 1mg  po today -Daily PT/INR  Georga Bora, PharmD Clinical Pharmacist 02/22/2018 1:36 PM

## 2018-02-22 NOTE — Progress Notes (Signed)
Physical Therapy Treatment Patient Details Name: [name] MRN: [id number] DOB: 12-14-1921 Today's Date: 02/22/2018    History of Present Illness 84 y.o. female admitted for SOB,productive cough, and wheezing. Pt was recently hospitalized for CHF exacerbation (d/c 02/15/18). Pt found to have acute hypoxic respiratory failure with COPD exacerbation. PMH includes but not limited to AAA, pacemaker, tachy brady syndrome, AVR, PVD, chronic diastolic CHF, and chronic a fib. She has a history of falls.    PT Comments    Today's session limited by pt dizziness. Pt attempted sit<>stand 2x, each time with report of dizziness. On second attempt, pt with LOB backwards with PTA assisting descent into bed. Pt returned to supine. SpO2 remained >95% throughout session. RN notified of pt's decreased tolerance to standing. Will continue to follow acutely to maximize pt's activity tolerance and safety with mobility.    Follow Up Recommendations  Home health PT;Supervision for mobility/OOB(vs SNF )     Equipment Recommendations  None recommended by PT    Recommendations for Other Services       Precautions / Restrictions Precautions Precautions: Fall Restrictions Weight Bearing Restrictions: No    Mobility  Bed Mobility Overal bed mobility: Needs Assistance Bed Mobility: Supine to Sit     Supine to sit: Min assist     General bed mobility comments: min A to elevate trunk and progress hips to EOB  Transfers Overall transfer level: Needs assistance Equipment used: 4-wheeled walker Transfers: Sit to/from Stand Sit to Stand: Mod assist         General transfer comment: mod A to power up into standing. Sit<>stand performed 2x. With each attempt, pt reported increased dizziness and needed to return to sitting.  Ambulation/Gait             General Gait Details: pt unable to tolerate standing today.    Stairs            Wheelchair Mobility    Modified Rankin (Stroke  Patients Only)       Balance Overall balance assessment: Needs assistance Sitting-balance support: No upper extremity supported;Feet supported Sitting balance-Leahy Scale: Fair     Standing balance support: Bilateral upper extremity supported Standing balance-Leahy Scale: Zero Standing balance comment: pt dizzy on standing and unable to maintain standing for more than 3 seconds.                            Cognition Arousal/Alertness: Awake/alert Behavior During Therapy: WFL for tasks assessed/performed Overall Cognitive Status: Within Functional Limits for tasks assessed                                        Exercises      General Comments General comments (skin integrity, edema, etc.): SpO2 remained >95% throughout session.      Pertinent Vitals/Pain Pain Assessment: Faces Faces Pain Scale: Hurts a little bit Pain Location: chronic knee pain Pain Descriptors / Indicators: Grimacing Pain Intervention(s): Limited activity within patient's tolerance;Monitored during session    Home Living                      Prior Function            PT Goals (current goals can now be found in the care plan section) Acute Rehab PT Goals Patient Stated Goal: return home PT Goal  Formulation: With patient/family Time For Goal Achievement: 03/07/18 Potential to Achieve Goals: Good Progress towards PT goals: Not progressing toward goals - comment(limited by dizziness)    Frequency    Min 3X/week      PT Plan Current plan remains appropriate    Co-evaluation              AM-PAC PT "6 Clicks" Daily Activity  Outcome Measure  Difficulty turning over in bed (including adjusting bedclothes, sheets and blankets)?: Unable Difficulty moving from lying on back to sitting on the side of the bed? : Unable Difficulty sitting down on and standing up from a chair with arms (e.g., wheelchair, bedside commode, etc,.)?: Unable Help needed moving  to and from a bed to chair (including a wheelchair)?: Total Help needed walking in hospital room?: Total Help needed climbing 3-5 steps with a railing? : Total 6 Click Score: 6    End of Session Equipment Utilized During Treatment: Gait belt Activity Tolerance: Other (comment)(limited by dizziness) Patient left: with call bell/phone within reach;in bed Nurse Communication: Mobility status;Other (comment)(dizziness, new purwik needed) PT Visit Diagnosis: Unsteadiness on feet (R26.81);History of falling (Z91.81);Muscle weakness (generalized) (M62.81)     Time: 1343-1410 PT Time Calculation (min) (ACUTE ONLY): 27 min  Charges:  $Therapeutic Activity: 23-37 mins                    G Codes:     02-25-2018  Benjiman Core, PTA Pager [phone] Acute Rehab   Allena Katz Feb 25, 2018, 2:22 PM

## 2018-02-23 LAB — GLUCOSE, CAPILLARY
GLUCOSE-CAPILLARY: 224 mg/dL — AB (ref 65–99)
GLUCOSE-CAPILLARY: 94 mg/dL (ref 65–99)
Glucose-Capillary: 209 mg/dL — ABNORMAL HIGH (ref 65–99)
Glucose-Capillary: 193 mg/dL — ABNORMAL HIGH (ref 65–99)

## 2018-02-23 LAB — PROTIME-INR
INR: 2.65
PROTHROMBIN TIME: 28.1 s — AB (ref 11.4–15.2)

## 2018-02-23 MED ORDER — TORSEMIDE 20 MG PO TABS
40.0000 mg | ORAL_TABLET | Freq: Every day | ORAL | Status: DC
  Administered 2018-02-24: 40 mg via ORAL
  Filled 2018-02-23: qty 2

## 2018-02-23 MED ORDER — AZITHROMYCIN 500 MG IV SOLR
250.0000 mg | INTRAVENOUS | Status: AC
  Administered 2018-02-23 – 2018-02-24 (×2): 250 mg via INTRAVENOUS
  Filled 2018-02-23 (×2): qty 250

## 2018-02-23 MED ORDER — WARFARIN SODIUM 5 MG PO TABS
5.0000 mg | ORAL_TABLET | Freq: Once | ORAL | Status: AC
  Administered 2018-02-23: 5 mg via ORAL
  Filled 2018-02-23: qty 1

## 2018-02-23 NOTE — Progress Notes (Signed)
[name] for coumadin Indication: atrial fibrillation  Allergies  Allergen Reactions  . Advair Diskus [Fluticasone-Salmeterol] Anaphylaxis  . Furosemide Shortness Of Breath    Tolerates torsemide  . Other Shortness Of Breath and Other (See Comments)    IODINATED CONTRAST MEDIA: Chills and fever also  . Salmeterol Anaphylaxis    THROAT CLOSES with Advair; tolerates formoterol and albuterol  . Iohexol Other (See Comments)    Desc:  Pt requires a 13 hr prep, when doing exam with contrast....fever, chills, dyspnea (slg/04/27/09--given 2 hour solumedrol/ benadryl prep--no reaction).    . Sulfonamide Derivatives Nausea And Vomiting  . Verapamil Hives, Nausea And Vomiting and Other (See Comments)    Loss of appetite also   . Adhesive [Tape] Other (See Comments)    TEARS and BRUISES SKIN  . Amoxicillin-Pot Clavulanate Nausea Only and Other (See Comments)    NAUSEA AND STOMACH PAIN  . Contrast Media [Iodinated Diagnostic Agents] Other (See Comments)    "Froze" and fever  . Ibuprofen Other (See Comments)    Has liver damage  . Levofloxacin Nausea Only and Other (See Comments)    NAUSEA AND STOMACH PAIN  . Macrobid [Nitrofurantoin Monohyd Macro] Nausea Only and Other (See Comments)    Severe stomach pain  . Latex Rash  . Penicillins Rash and Other (See Comments)    Has patient had a PCN reaction causing immediate rash, facial/tongue/throat swelling, SOB or lightheadedness with hypotension: Yes Has patient had a PCN reaction causing severe rash involving mucus membranes or skin necrosis: Unk Has patient had a PCN reaction that required hospitalization: Unk Has patient had a PCN reaction occurring within the last 10 years: No If all of the above answers are "NO", then may proceed with Cephalosporin use.     Vital Signs: Temp: 97.5 F (36.4 C) (04/09 1121) Temp Source: Oral (04/09 1121) BP: 156/94 (04/09 1135) Pulse Rate: 82 (04/09  1135)  Labs: Recent Labs    02/20/18 1604 02/20/18 1819 02/20/18 2322 02/21/18 0523 02/22/18 0505 02/23/18 0617  HGB 12.3  --   --  12.9 12.8  --   HCT 39.9  --   --  42.0 41.7  --   PLT 129*  --   --  129* 130*  --   LABPROT 33.4*  --   --  32.4* 33.6* 28.1*  INR 3.32  --   --  3.19 3.34 2.65  CREATININE 1.15*  --   --  1.13* 1.25*  --   TROPONINI 0.09* 0.08* 0.08* 0.08*  --   --     Estimated Creatinine Clearance: 23.3 mL/min (A) (by C-G formula based on SCr of 1.25 mg/dL (H)).   Medical History: Past Medical History:  Diagnosis Date  . AAA (abdominal aortic aneurysm) ([place])    a. 12/2011 U/S: 3.3 cm prox AAA.  [name] Anxiety   . Aortic insufficiency    a. S/P bioprosthetic AVR;  b. 02/2013 Echo: EF 50-55%, mild to mod AI (mean grad 3mmHg).  . Brain aneurysm    Hx of  . Chest pain    a. H/o nonobs cath;  b. 07/2013 Lexiscan MV: EF 66%, no ischemia.  . Chronic diastolic CHF (congestive heart failure) ([place])    a. 02/2013 Echo: EF 50-55%.  . Diverticulosis   . Gastritis   . Hemorrhoids   . Hepatitis B 1980's  . Hiatal hernia   . Hypertension   . Moderate mitral regurgitation    a.  02/2013 Echo: Mod MR.  . Moderate tricuspid regurgitation    a. 02/2013 Echo: Mod TR.  [name] Permanent atrial fibrillation ([place])    a. Chronic coumadin.  . Polymyalgia rheumatica ([place])   . Pulmonary hypertension ([place])    a. 02/2013 Echo: PASP 44mmHg.  . S/P aortic valve replacement with bioprosthetic valve    a. [facility].  . S/P placement of cardiac pacemaker    a.  08/2007 s/p MDT [place] PPM.  . Seizures ([place])   . Shingles   . Skin cancer    face, ear  and left hand.  . Tachy-brady syndrome ([place])    a. Severe in the setting of permanent atrial fibrillation with tachybrady syndrome;  b. 08/2007 s/p MDT [place] PPM.  . Thoracic aneurysm    a. 10/2011 CTA: 5.0 x 3.8 cm.    Scheduled:  . feeding supplement (GLUCERNA SHAKE)  237 mL Oral TID BM  . fluticasone  1 spray  Each Nare Daily  . guaiFENesin  600 mg Oral BID  . insulin aspart  0-5 Units Subcutaneous QHS  . insulin aspart  0-9 Units Subcutaneous TID WC  . ipratropium-albuterol  3 mL Nebulization BID  . latanoprost  1 drop Both Eyes QHS  . loratadine  10 mg Oral Daily  . LORazepam  0.5 mg Oral TID  . mouth rinse  15 mL Mouth Rinse BID  . methylPREDNISolone (SOLU-MEDROL) injection  40 mg Intravenous Q12H  . mometasone-formoterol  2 puff Inhalation BID  . pantoprazole  40 mg Oral Daily  . PARoxetine  10 mg Oral Daily  . polyethylene glycol  17 g Oral Daily  . potassium chloride SA  20 mEq Oral Daily  . [START ON 02/24/2018] torsemide  40 mg Oral Daily  . traMADol  25 mg Oral QHS  . traZODone  75 mg Oral QHS  . Warfarin - Pharmacist Dosing Inpatient   Does not apply q1800    Assessment: 84 yo female here with SOB. She is on coumadin PTA for afib (also history of bioprosthetic AVR) and pharmacy consulted to dose.   -INR is down 3.34>2.65 following reduced dose yesterday. CBC stable, no overt bleeding; Noted interaction with azithromycin.  Home dose: 2.5mg /day except 5mg  on TuSa (last clinic visit 02/08/18; INR= 2.8)  Goal of Therapy:  Goal INR 2.5- 3.0 (per clinic notes) Monitor platelets by anticoagulation protocol: Yes   Plan:  -Coumadin 5mg  po today -Daily PT/INR  Georga Bora, PharmD Clinical Pharmacist 02/23/2018 12:53 PM

## 2018-02-23 NOTE — Plan of Care (Signed)
  Problem: Education: Goal: Knowledge of General Education information will improve Outcome: Adequate for Discharge   Problem: Health Behavior/Discharge Planning: Goal: Ability to manage health-related needs will improve Outcome: Adequate for Discharge   Problem: Clinical Measurements: Goal: Ability to maintain clinical measurements within normal limits will improve Outcome: Adequate for Discharge

## 2018-02-23 NOTE — Progress Notes (Signed)
Patient was placed on cont. Pulse ox with O2 off. Reading as follows:  2100 90% RA 2200 94% RA 2300 88% RA 2320 91% RA 0000 87% RA 0005 84% RA 0010 85% RA 0015 89% RA 0030 91% RA 0100 89% RA 0115 90% RA 0130 84% RA 0135 77% RA 0140 87% RA 0145 88% RA 0200 90% RA 0222 85% RA 0226 (O2 reapplied at 1L) 93% 0240 (.5L O2) 95%  At this point, patient stated she felt more comfortable and the beeping to the continuous pulse ox stopped. Will continue to monitor and chart O2 stat with morning vitals

## 2018-02-23 NOTE — Progress Notes (Signed)
Hospitalist progress note   [name]  KTG:[id number] DOB: Nov 17, 1922 DOA: 02/20/2018 PCP: Jodi Marble, MD Specialists:    Brief Narrative:  84 year old female  Tachybradycardia syndrome, bioprosthetic AVR 04/2016, AAA 4-5 years prior, right brachial artery stenosis, TIA 2012, peripheral vascular disease, polymyalgia rheumatica on chronic prednisone, HTN, Once recurrent urinary tract infections Hemoptysis shortness of breath, pulmonary hypertension, numerous skin cancers, bilateral osteoarthritis  Patient was re-admitted 02/20/2018 with 3 pound weight gain and noticed platypnea on exam noted to also have bilateral expiratory wheezing with a BNP 230 and started also on treatment for COPD exacerbation with Solu-Medrol and azithromycin and Mucinex  Recent admission 3/29-02/15/18 acute decompensation of diastolic heart failure-switch from oral torsemide 20--->40 daily.  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 4/10 Patient will need oxygen on discharge home at least 2 L   Acute/chronic heart failure TTE 60-65% creatinine currently 1.2 baseline 1.1 currently is on torsemide 20 --- have increased back to home dose 40 02/24/16  Hypoxia  probably secondary to multiple reasons--O2 sats dropped overnight into the 70s and will need nightly oxygen--will need outpatient OSA screening  Tachybradycardia syndrome, permanent atrial fibrillation chads score >5 on Coumadin-INR is therapeutic-on monitors has a paced rhythm and is not on any rate controlling agent at this time  Polymyalgia rheumatica on chronic prednisone 7.5 daily-because she is on chronic prednisone for polymyalgia rheumatica will probably need a very slow taper down from 40 mg over the next month as her age and chronic prednisone use would likely be the cause for having less endogenous cortisol  Hyperglycemia A1c 63/31/19  Bipolar continue Paxil 10 daily, Trazodone 75 at bedtime and lorazepam 0.5 3 times daily  DVT prophylaxis: Patient therapeutic on Coumadin code Status:   DNR   family Communication:   Discussed with patient's niece disposition Plan: Would consider discharge in about 48 hours   Consultants:   None  Procedures:   None  Antimicrobials:   Azithromycin  Subjective: Overall feels better, able to lay flat, does not have any sputum no fever, appetite is reasonable, mouth is dry, no chest pain but does feel an occasional "heaviness"   Objective: Vitals:   02/23/18 0723 02/23/18 0800 02/23/18 0932 02/23/18 0934  BP:      Pulse:      Resp:      Temp:      TempSrc:      SpO2:  93% 94% 94%  Weight: 61.5 kg (135 lb 9.6 oz)     Height:        Intake/Output Summary (Last 24 hours) at 02/23/2018 1014 Last data filed at 02/23/2018 0851 Gross per 24 hour  Intake 645 ml  Output 1075 ml  Net -430 ml   Filed Weights   02/21/18 0536 02/22/18 0531 02/23/18 4287  Weight: 62.4 kg (137 lb 9.1 oz) 61.7 kg (136 lb) 61.5 kg (135 lb 9.6 oz)    Examination: EOMI NCAT multiple areas on nose with lesions that look papulovesicular No JVD no bruit Holosystolic 6/6 murmur radiating up to carotids, chest is slightly crackly posteriorly without any egophony  or fremitus Abdomen is soft with midline scar and umbilical hernia. Musculoskeletal power is 5/5 equally arcus senilis is noted smile is symmetric sensory is grossly intact She has no lower extremity edema does have some mild clawing of her toes Psych is euthymic   Data Reviewed: I have personally reviewed following labs and imaging studies  CBC: Recent Labs  Lab 02/20/18 1604 02/21/18 0523 02/22/18 0505  WBC 4.3 3.3* 4.7  NEUTROABS 3.5  --   --   HGB 12.3 12.9 12.8  HCT 39.9 42.0 41.7  MCV 92.8 92.5 92.3  PLT 129* 129* 509*   Basic Metabolic Panel: Recent Labs  Lab 02/20/18 1604 02/20/18 1819 02/21/18 0523 02/22/18 0505  NA 137  --  136 136  K 3.4*  --  4.3 3.9  CL 94*  --  95* 94*  CO2 28  --  25 28  GLUCOSE 185*  --  176* 179*  BUN 23*  --  27* 38*  CREATININE 1.15*  --  1.13* 1.25*  CALCIUM 8.9  --  8.8* 8.7*  MG  --  1.9  --   --    GFR: Estimated Creatinine Clearance: 23.3 mL/min (A) (by C-G formula based on SCr of 1.25 mg/dL (H)). Liver Function Tests: No results for input(s): AST, ALT, ALKPHOS, BILITOT, PROT, ALBUMIN in the last 168 hours. No results for input(s): LIPASE, AMYLASE in the last 168 hours. No results for input(s): AMMONIA in the last 168 hours. Coagulation Profile: Recent Labs  Lab 02/20/18 1604 02/21/18 0523 02/22/18 0505 02/23/18 0617  INR 3.32 3.19 3.34 2.65   Cardiac Enzymes:  Radiology Studies: Reviewed images personally in health database   Scheduled Meds: . feeding supplement (GLUCERNA SHAKE)  237 mL Oral TID BM  . fluticasone  1 spray Each Nare Daily  . guaiFENesin  600 mg Oral BID  . insulin aspart  0-5 Units Subcutaneous QHS  . insulin aspart  0-9 Units Subcutaneous TID WC  . ipratropium-albuterol  3 mL Nebulization BID  . latanoprost  1 drop Both Eyes QHS  . loratadine  10 mg Oral Daily  . LORazepam  0.5 mg Oral TID  . mouth rinse  15 mL Mouth Rinse BID  . methylPREDNISolone (SOLU-MEDROL) injection  40 mg  Intravenous Q12H  . mometasone-formoterol  2 puff Inhalation BID  . pantoprazole  40 mg Oral Daily  . PARoxetine  10 mg Oral Daily  . polyethylene glycol  17 g Oral Daily  . potassium chloride SA  20 mEq Oral Daily  . torsemide  20 mg Oral Daily  . traMADol  25 mg Oral QHS  . traZODone  75 mg Oral QHS  . Warfarin - Pharmacist Dosing Inpatient   Does not apply q1800   Continuous Infusions: . azithromycin Stopped (02/22/18 3267)     LOS: 3 days    Time spent: [place], MD [facility] ([facility]  If 7PM-7AM, please contact night-coverage [email or web address] Password [email or web address] 02/23/2018, 10:14 AM

## 2018-02-23 NOTE — Progress Notes (Signed)
Attempt to try ra sat but 86-87%, pt report sob, pt rhonchi and wheezes, pt repostioned in bed, using flutter valve, guafenisin prn given po as ordered, called RT to reinforce using flutter valve, ot reapplied and will attempt later when pt feerling better

## 2018-02-24 LAB — BASIC METABOLIC PANEL
ANION GAP: 15 (ref 5–15)
BUN: 45 mg/dL — ABNORMAL HIGH (ref 6–20)
CALCIUM: 8.5 mg/dL — AB (ref 8.9–10.3)
CO2: 34 mmol/L — AB (ref 22–32)
CREATININE: 1.18 mg/dL — AB (ref 0.44–1.00)
Chloride: 95 mmol/L — ABNORMAL LOW (ref 101–111)
GFR, EST AFRICAN AMERICAN: 45 mL/min — AB (ref >60)
GFR, EST NON AFRICAN AMERICAN: 39 mL/min — AB (ref >60)
Glucose, Bld: 183 mg/dL — ABNORMAL HIGH (ref 65–99)
Potassium: 3.2 mmol/L — ABNORMAL LOW (ref 3.5–5.1)
Sodium: 144 mmol/L (ref 135–145)

## 2018-02-24 LAB — GLUCOSE, CAPILLARY
GLUCOSE-CAPILLARY: 191 mg/dL — AB (ref 65–99)
GLUCOSE-CAPILLARY: 248 mg/dL — AB (ref 65–99)
Glucose-Capillary: 212 mg/dL — ABNORMAL HIGH (ref 65–99)
Glucose-Capillary: 200 mg/dL — ABNORMAL HIGH (ref 65–99)

## 2018-02-24 LAB — MAGNESIUM: Magnesium: 2.1 mg/dL (ref 1.7–2.4)

## 2018-02-24 LAB — PROTIME-INR
INR: 2.06
PROTHROMBIN TIME: 23 s — AB (ref 11.4–15.2)

## 2018-02-24 MED ORDER — WARFARIN SODIUM 3 MG PO TABS
3.0000 mg | ORAL_TABLET | Freq: Once | ORAL | Status: AC
  Administered 2018-02-24: 3 mg via ORAL
  Filled 2018-02-24: qty 1

## 2018-02-24 MED ORDER — POTASSIUM CHLORIDE CRYS ER 20 MEQ PO TBCR
40.0000 meq | EXTENDED_RELEASE_TABLET | Freq: Every day | ORAL | Status: DC
  Administered 2018-02-24 – 2018-02-26 (×3): 40 meq via ORAL
  Filled 2018-02-24 (×3): qty 2

## 2018-02-24 MED ORDER — HYDRALAZINE HCL 25 MG PO TABS
25.0000 mg | ORAL_TABLET | Freq: Three times a day (TID) | ORAL | Status: DC
  Administered 2018-02-24 – 2018-02-26 (×7): 25 mg via ORAL
  Filled 2018-02-24 (×7): qty 1

## 2018-02-24 MED ORDER — SODIUM CHLORIDE 0.9% FLUSH
10.0000 mL | INTRAVENOUS | Status: DC | PRN
  Administered 2018-02-25: 30 mL
  Filled 2018-02-24: qty 40

## 2018-02-24 NOTE — Progress Notes (Signed)
Hospitalist progress note   [name]  SEG:[id number] DOB: [birth year] DOA: 02/20/2018 PCP: Jodi Marble, MD Specialists:    Brief Narrative:  84 year old female  Tachybradycardia syndrome, bioprosthetic AVR 04/2016, AAA 4-5 years prior, right brachial artery stenosis, TIA 2012, peripheral vascular disease, polymyalgia rheumatica on chronic prednisone, HTN, Once recurrent urinary tract infections Hemoptysis shortness of breath, pulmonary hypertension, numerous skin cancers, bilateral osteoarthritis  Patient was re-admitted 02/20/2018 with 3 pound weight gain and noticed platypnea on exam noted to also have bilateral expiratory wheezing with a BNP 230 and started also on treatment for COPD exacerbation with Solu-Medrol and azithromycin and Mucinex  Recent admission 3/29-02/15/18 acute decompensation of diastolic heart failure-switch from oral torsemide 20--->40 daily.  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 4/10 desat screen ordered 4/10  Acute/chronic heart failure TTE 60-65% creatinine currently 1.2 baseline 1.1  currently back to home dose 40 02/24/16 I/o was -2.056, wgt down 62-->59 Admits to dietary indiscretion--eats a lot of Fast  food--discussed with her to figure out other   Hypoxia probably secondary to multiple reasons--O2 sats dropped overnight into the 70s and will need nightly oxygen--will need outpatient OSA screening  Tachybradycardia syndrome, permanent atrial fibrillation chads score >5 on Coumadin-INR is therapeutic slight lower 2.06- -no arrythmia other than paced rhythmn  Polymyalgia rheumatica on chronic prednisone 7.5 daily-because she is on chronic prednisone for polymyalgia rheumatica w-slow taper down from 40 mg over the next month as her age and chronic prednisone use would likely be the cause for having less endogenous cortisol  Hyperglycemia A1c 63/31/19  Bipolar continue Paxil 10 daily, Trazodone 75 at bedtime and lorazepam 0.5 3 times daily  Htn-monitor blood pressures--trend has been 737 systolic--adding hydralazine 25 tid  DVT prophylaxis: Patient therapeutic on Coumadin Code Status:   DNR   family Communication:   Discussed with patient's niece on 4/10 DIsposition Plan: Would consider discharge in about 48 hours   Consultants:   None  Procedures:   None  Antimicrobials:   Azithromycin  Subjective:  Feels better to some degree.  No cp No fever no chills no n/v Overall however feels weak Doesn't like food--shares with me since moved here eating lots of Fast food   Objective: Vitals:   02/24/18 0703 02/24/18 0729 02/24/18 0936 02/24/18 0940  BP:  (!) 169/93    Pulse:      Resp:      Temp:      TempSrc:      SpO2:   95% 93%  Weight: 59.7 kg (131 lb 9.6 oz)     Height:        Intake/Output Summary (Last 24 hours) at 02/24/2018 1016 Last data filed at 02/24/2018 0826 Gross per 24 hour  Intake 725 ml  Output 1550 ml  Net -825 ml  Filed Weights   02/22/18 0531 02/23/18 0723 02/24/18 0703  Weight: 61.7 kg (136 lb) 61.5 kg (135 lb 9.6 oz) 59.7 kg (131 lb 9.6 oz)    Examination: EOMI NCAT multiple areas on nose with lesions that look papulovesicular Bruit absent,  no jvd Holosystolic 6/6 murmur radiating up to carotids, chest still crackley Abdomen is soft with midline scar and umbilical hernia. Musculoskeletal power is 5/5 equally arcus senilis is noted smile is symmetric sensory is grossly intact She has no lower extremity edema does have some mild clawing of her toes Psych is euthymic   Data Reviewed: I have personally reviewed following labs and imaging studies  CBC: Recent Labs  Lab 02/20/18 1604 02/21/18 0523 02/22/18 0505  WBC 4.3 3.3* 4.7  NEUTROABS 3.5  --   --   HGB 12.3 12.9 12.8  HCT 39.9 42.0 41.7  MCV 92.8 92.5 92.3  PLT 129* 129* 528*   Basic Metabolic Panel: Recent Labs  Lab 02/20/18 1604 02/20/18 1819 02/21/18 0523 02/22/18 0505 02/24/18 0436  NA 137  --  136 136 144  K 3.4*  --  4.3 3.9 3.2*  CL 94*  --  95* 94* 95*  CO2 28  --  25 28 34*  GLUCOSE 185*  --  176* 179* 183*  BUN 23*  --  27* 38* 45*  CREATININE 1.15*  --  1.13* 1.25* 1.18*  CALCIUM 8.9  --  8.8* 8.7* 8.5*  MG  --  1.9  --   --  2.1   GFR: Estimated Creatinine Clearance: 24.6 mL/min (A) (by C-G formula based on SCr of 1.18 mg/dL (H)). Liver Function Tests: No results for input(s): AST, ALT, ALKPHOS, BILITOT, PROT, ALBUMIN in the last 168 hours. No results for input(s): LIPASE, AMYLASE in the last 168 hours. No results for input(s): AMMONIA in the last 168 hours. Coagulation Profile: Recent Labs  Lab 02/20/18 1604 02/21/18 0523 02/22/18 0505 02/23/18 0617 02/24/18 0436  INR 3.32 3.19 3.34 2.65 2.06   Cardiac Enzymes:  Radiology Studies: Reviewed images personally in health database   Scheduled Meds: . feeding supplement (GLUCERNA SHAKE)  237 mL Oral TID BM  . fluticasone  1 spray Each Nare Daily  . guaiFENesin  600 mg Oral BID  . insulin aspart  0-5 Units Subcutaneous QHS  . insulin aspart  0-9 Units Subcutaneous TID WC  . ipratropium-albuterol  3 mL Nebulization BID  . latanoprost  1 drop Both Eyes QHS  . loratadine  10 mg  Oral Daily  . LORazepam  0.5 mg Oral TID  . mouth rinse  15 mL Mouth Rinse BID  . methylPREDNISolone (SOLU-MEDROL) injection  40 mg Intravenous Q12H  . mometasone-formoterol  2 puff Inhalation BID  . pantoprazole  40 mg Oral Daily  . PARoxetine  10 mg Oral Daily  . polyethylene glycol  17 g Oral Daily  . potassium chloride  40 mEq Oral Daily  . torsemide  40 mg Oral Daily  . traMADol  25 mg Oral QHS  . traZODone  75 mg Oral QHS  . warfarin  3 mg Oral ONCE-1800  . Warfarin - Pharmacist Dosing Inpatient   Does not apply q1800   Continuous Infusions: . azithromycin Stopped (02/23/18 1813)     LOS: 4 days    Time spent: [place], MD [facility]  If 7PM-7AM, please contact night-coverage [email or web address] Password [email or web address] 02/24/2018, 10:16 AM

## 2018-02-24 NOTE — Plan of Care (Signed)
  Problem: Clinical Measurements: Goal: Ability to maintain clinical measurements within normal limits will improve Outcome: Progressing   Problem: Clinical Measurements: Goal: Respiratory complications will improve Outcome: Progressing   

## 2018-02-24 NOTE — Progress Notes (Signed)
[place] for coumadin Indication: atrial fibrillation  Allergies  Allergen Reactions  . Advair Diskus [Fluticasone-Salmeterol] Anaphylaxis  . Furosemide Shortness Of Breath    Tolerates torsemide  . Other Shortness Of Breath and Other (See Comments)    IODINATED CONTRAST MEDIA: Chills and fever also  . Salmeterol Anaphylaxis    THROAT CLOSES with Advair; tolerates formoterol and albuterol  . Iohexol Other (See Comments)    Desc:  Pt requires a 13 hr prep, when doing exam with contrast....fever, chills, dyspnea (slg/04/27/09--given 2 hour solumedrol/ benadryl prep--no reaction).    . Sulfonamide Derivatives Nausea And Vomiting  . Verapamil Hives, Nausea And Vomiting and Other (See Comments)    Loss of appetite also   . Adhesive [Tape] Other (See Comments)    TEARS and BRUISES SKIN  . Amoxicillin-Pot Clavulanate Nausea Only and Other (See Comments)    NAUSEA AND STOMACH PAIN  . Contrast Media [Iodinated Diagnostic Agents] Other (See Comments)    "Froze" and fever  . Ibuprofen Other (See Comments)    Has liver damage  . Levofloxacin Nausea Only and Other (See Comments)    NAUSEA AND STOMACH PAIN  . Macrobid [Nitrofurantoin Monohyd Macro] Nausea Only and Other (See Comments)    Severe stomach pain  . Latex Rash  . Penicillins Rash and Other (See Comments)    Has patient had a PCN reaction causing immediate rash, facial/tongue/throat swelling, SOB or lightheadedness with hypotension: Yes Has patient had a PCN reaction causing severe rash involving mucus membranes or skin necrosis: Unk Has patient had a PCN reaction that required hospitalization: Unk Has patient had a PCN reaction occurring within the last 10 years: No If all of the above answers are "NO", then may proceed with Cephalosporin use.     Vital Signs: Temp: 97.7 F (36.5 C) (04/10 0650) Temp Source: Oral (04/10 0650) BP: 169/93 (04/10 0729) Pulse Rate: 77 (04/10  0650)  Labs: Recent Labs    02/22/18 0505 02/23/18 0617 02/24/18 0436  HGB 12.8  --   --   HCT 41.7  --   --   PLT 130*  --   --   LABPROT 33.6* 28.1* 23.0*  INR 3.34 2.65 2.06  CREATININE 1.25*  --  1.18*    Estimated Creatinine Clearance: 24.6 mL/min (A) (by C-G formula based on SCr of 1.18 mg/dL (H)).   Medical History: Past Medical History:  Diagnosis Date  . AAA (abdominal aortic aneurysm) ([place])    a. 12/2011 U/S: 3.3 cm prox AAA.  [name] Anxiety   . Aortic insufficiency    a. S/P bioprosthetic AVR;  b. 02/2013 Echo: EF 50-55%, mild to mod AI (mean grad 62mmHg).  . Brain aneurysm    Hx of  . Chest pain    a. H/o nonobs cath;  b. 07/2013 Lexiscan MV: EF 66%, no ischemia.  . Chronic diastolic CHF (congestive heart failure) ([place])    a. 02/2013 Echo: EF 50-55%.  . Diverticulosis   . Gastritis   . Hemorrhoids   . Hepatitis B 1980's  . Hiatal hernia   . Hypertension   . Moderate mitral regurgitation    a. 02/2013 Echo: Mod MR.  . Moderate tricuspid regurgitation    a. 02/2013 Echo: Mod TR.  [name] Permanent atrial fibrillation ([place])    a. Chronic coumadin.  . Polymyalgia rheumatica ([place])   . Pulmonary hypertension ([place])    a. 02/2013 Echo: PASP 45mmHg.  . S/P aortic valve replacement  with bioprosthetic valve    a. [facility]  . S/P placement of cardiac pacemaker    a.  08/2007 s/p MDT [place] PPM.  . Seizures ([place])   . Shingles   . Skin cancer    face, ear  and left hand.  . Tachy-brady syndrome ([place])    a. Severe in the setting of permanent atrial fibrillation with tachybrady syndrome;  b. 08/2007 s/p MDT [place] PPM.  . Thoracic aneurysm    a. 10/2011 CTA: 5.0 x 3.8 cm.    Scheduled:  . feeding supplement (GLUCERNA SHAKE)  237 mL Oral TID BM  . fluticasone  1 spray Each Nare Daily  . guaiFENesin  600 mg Oral BID  . insulin aspart  0-5 Units Subcutaneous QHS  . insulin aspart  0-9 Units Subcutaneous TID WC  . ipratropium-albuterol  3 mL  Nebulization BID  . latanoprost  1 drop Both Eyes QHS  . loratadine  10 mg Oral Daily  . LORazepam  0.5 mg Oral TID  . mouth rinse  15 mL Mouth Rinse BID  . methylPREDNISolone (SOLU-MEDROL) injection  40 mg Intravenous Q12H  . mometasone-formoterol  2 puff Inhalation BID  . pantoprazole  40 mg Oral Daily  . PARoxetine  10 mg Oral Daily  . polyethylene glycol  17 g Oral Daily  . potassium chloride SA  20 mEq Oral Daily  . torsemide  40 mg Oral Daily  . traMADol  25 mg Oral QHS  . traZODone  75 mg Oral QHS  . Warfarin - Pharmacist Dosing Inpatient   Does not apply q1800    Assessment: 84 yo female here with SOB. She is on coumadin PTA for afib (also history of bioprosthetic AVR) and pharmacy consulted to dose.   -INR is down 3.34>2.65>2.06 following reduced dose 4/8. CBC stable, no overt bleeding; Noted interaction with azithromycin. Patient has not been eating well, but will give slightly higher dose today to prevent subtherapeutic drop.   Home dose: 2.5mg /day except 5mg  on TuSa (last clinic visit 02/08/18; INR= 2.8)  Goal of Therapy:  Goal INR 2.5- 3.0 (per clinic notes) Monitor platelets by anticoagulation protocol: Yes   Plan:  -Coumadin 3mg  po today -Daily PT/INR  Georga Bora, PharmD Clinical Pharmacist 02/24/2018 8:49 AM

## 2018-02-24 NOTE — Progress Notes (Signed)
Physical Therapy Treatment Patient Details Name: [name] MRN: [id number] DOB: 1922/03/05 Today's Date: 02/24/2018    History of Present Illness 84 y.o. female admitted for SOB,productive cough, and wheezing. Pt was recently hospitalized for CHF exacerbation (d/c 02/15/18). Pt found to have acute hypoxic respiratory failure with COPD exacerbation. PMH includes but not limited to AAA, pacemaker, tachy brady syndrome, AVR, PVD, chronic diastolic CHF, and chronic a fib. She has a history of falls.    PT Comments    Pt was received in bed happy and willing to take part in PT.  Her niece arrived at the beginning of the session.  Pt needed VC's for hand placement and how to move legs during bed mobility.  She c/o dizziness immediately after sitting up.  BP was taken to check for orthostatic problems, systolic pressure stayed at 117mmhg during supine and sitting.  Pt attempted standing trials 2x, c/o dizziness both times.  The second time she was +2 for assistance.  She took a seated rest break after the first attempt and proceeded to ambulation after the second attempt.  Pt was mod A during ambulation with +2 for a chair follow due to dizziness.  She needed VC's to stay inside the walker, keep her back straight, ambulate forward/straight, and to navigate around obstructions.  Emphasize maximizing pts activity tolerance and safety with mobility in future sessions.  Pt remains to benefit from skilled Home health PT or SNF to help improve safety and functional independence.    Follow Up Recommendations  Home health PT;Supervision for mobility/OOB     Equipment Recommendations  None recommended by PT    Recommendations for Other Services       Precautions / Restrictions Precautions Precautions: Fall Precaution Comments: Pt gets dizzy quickly when sitting/standing Restrictions Weight Bearing Restrictions: No    Mobility  Bed Mobility Overal bed mobility: Needs Assistance Bed Mobility:  Supine to Sit     Supine to sit: Max assist     General bed mobility comments: Mod A to elevate trunk and sit up.  Total A to move hips to EOB. Pt co/o dizziness upon sitting up.  No change in systolic BP from supine to sit.    Transfers Overall transfer level: Needs assistance Equipment used: Rolling walker (2 wheeled) Transfers: Sit to/from Stand Sit to Stand: Mod assist;+2 physical assistance         General transfer comment: Pt could power up to begin standing but needed mod A to finish standing up . Sit<>stand performed 2x. With first attempt, pt reported dizziness and needed to return to sitting.  With second attempt pt proceded to ambulation.  First attempt was +1, second attempt was +2.    Ambulation/Gait Ambulation/Gait assistance: Mod assist;+2 safety/equipment Ambulation Distance (Feet): 30 Feet Assistive device: Rolling walker (2 wheeled) Gait Pattern/deviations: Step-through pattern;Decreased step length - right;Decreased step length - left;Trunk flexed     General Gait Details: VC's to keep pt inside walker, for flexed trunk, to keep pt moving forward/straight, and to ambulate around obstacles.     Stairs            Wheelchair Mobility    Modified Rankin (Stroke Patients Only)       Balance Overall balance assessment: Needs assistance Sitting-balance support: No upper extremity supported;Feet supported       Standing balance support: Bilateral upper extremity supported   Standing balance comment: pt dizzy on standing  Cognition Arousal/Alertness: Awake/alert Behavior During Therapy: WFL for tasks assessed/performed Overall Cognitive Status: Within Functional Limits for tasks assessed                                        Exercises      General Comments General comments (skin integrity, edema, etc.): Tested BP in supine and sitting to check for orthostatic problems, there was no change  in systolic BP.        Pertinent Vitals/Pain Pain Assessment: Faces Faces Pain Scale: Hurts a little bit Pain Location: chronic knee pain Pain Descriptors / Indicators: Grimacing    Home Living                      Prior Function            PT Goals (current goals can now be found in the care plan section) Acute Rehab PT Goals Patient Stated Goal: return home PT Goal Formulation: With patient/family Time For Goal Achievement: 03/07/18 Potential to Achieve Goals: Good Progress towards PT goals: Progressing toward goals    Frequency    Min 3X/week      PT Plan Current plan remains appropriate    Co-evaluation              AM-PAC PT "6 Clicks" Daily Activity  Outcome Measure  Difficulty turning over in bed (including adjusting bedclothes, sheets and blankets)?: Unable Difficulty moving from lying on back to sitting on the side of the bed? : A Lot Difficulty sitting down on and standing up from a chair with arms (e.g., wheelchair, bedside commode, etc,.)?: Unable Help needed moving to and from a bed to chair (including a wheelchair)?: Total Help needed walking in hospital room?: A Lot Help needed climbing 3-5 steps with a railing? : Total 6 Click Score: 8    End of Session Equipment Utilized During Treatment: Gait belt;Oxygen(3L's O2) Activity Tolerance: Patient limited by fatigue Patient left: with call bell/phone within reach;in bed;with chair alarm set;with nursing/sitter in room Nurse Communication: Mobility status;Other (comment) PT Visit Diagnosis: Unsteadiness on feet (R26.81);History of falling (Z91.81);Muscle weakness (generalized) (M62.81)     Time: 3785-8850 PT Time Calculation (min) (ACUTE ONLY): 31 min  Charges:  $Gait Training: 8-22 mins $Therapeutic Activity: 8-22 mins                    G Codes:       Terri Skains, SPTA [phone]    Terri Skains 02/24/2018, 4:57 PM

## 2018-02-24 NOTE — Progress Notes (Signed)
IV team consult D/T difficult stick. IV team placed powerglide (midline)- RN placed restricted armband and explained importance with teachback.

## 2018-02-25 LAB — CBC WITH DIFFERENTIAL/PLATELET
BASOS ABS: 0 10*3/uL (ref 0.0–0.1)
Basophils Relative: 0 %
Eosinophils Absolute: 0 10*3/uL (ref 0.0–0.7)
Eosinophils Relative: 0 %
HEMATOCRIT: 43.6 % (ref 36.0–46.0)
HEMOGLOBIN: 13.6 g/dL (ref 12.0–15.0)
LYMPHS PCT: 11 %
Lymphs Abs: 0.7 10*3/uL (ref 0.7–4.0)
MCH: 28.8 pg (ref 26.0–34.0)
MCHC: 31.2 g/dL (ref 30.0–36.0)
MCV: 92.4 fL (ref 78.0–100.0)
Monocytes Absolute: 0.4 10*3/uL (ref 0.1–1.0)
Monocytes Relative: 6 %
NEUTROS ABS: 4.9 10*3/uL (ref 1.7–7.7)
NEUTROS PCT: 83 %
Platelets: 116 10*3/uL — ABNORMAL LOW (ref 150–400)
RBC: 4.72 MIL/uL (ref 3.87–5.11)
RDW: 15.6 % — ABNORMAL HIGH (ref 11.5–15.5)
WBC: 6 10*3/uL (ref 4.0–10.5)

## 2018-02-25 LAB — BASIC METABOLIC PANEL
ANION GAP: 12 (ref 5–15)
BUN: 39 mg/dL — ABNORMAL HIGH (ref 6–20)
CALCIUM: 8.5 mg/dL — AB (ref 8.9–10.3)
CO2: 35 mmol/L — AB (ref 22–32)
Chloride: 97 mmol/L — ABNORMAL LOW (ref 101–111)
Creatinine, Ser: 1.09 mg/dL — ABNORMAL HIGH (ref 0.44–1.00)
GFR calc non Af Amer: 42 mL/min — ABNORMAL LOW (ref >60)
GFR, EST AFRICAN AMERICAN: 49 mL/min — AB (ref >60)
Glucose, Bld: 164 mg/dL — ABNORMAL HIGH (ref 65–99)
POTASSIUM: 3.3 mmol/L — AB (ref 3.5–5.1)
Sodium: 144 mmol/L (ref 135–145)

## 2018-02-25 LAB — GLUCOSE, CAPILLARY
GLUCOSE-CAPILLARY: 156 mg/dL — AB (ref 65–99)
Glucose-Capillary: 164 mg/dL — ABNORMAL HIGH (ref 65–99)
Glucose-Capillary: 261 mg/dL — ABNORMAL HIGH (ref 65–99)
Glucose-Capillary: 111 mg/dL — ABNORMAL HIGH (ref 65–99)

## 2018-02-25 LAB — PROTIME-INR
INR: 2.63
PROTHROMBIN TIME: 27.9 s — AB (ref 11.4–15.2)

## 2018-02-25 MED ORDER — WARFARIN SODIUM 2.5 MG PO TABS
2.5000 mg | ORAL_TABLET | Freq: Once | ORAL | Status: AC
  Administered 2018-02-25: 2.5 mg via ORAL
  Filled 2018-02-25: qty 1

## 2018-02-25 MED ORDER — TORSEMIDE 20 MG PO TABS
20.0000 mg | ORAL_TABLET | Freq: Every day | ORAL | Status: DC
  Administered 2018-02-25 – 2018-02-26 (×2): 20 mg via ORAL
  Filled 2018-02-25 (×2): qty 1

## 2018-02-25 MED ORDER — PREDNISONE 20 MG PO TABS
40.0000 mg | ORAL_TABLET | Freq: Every day | ORAL | Status: DC
  Administered 2018-02-26: 40 mg via ORAL
  Filled 2018-02-25: qty 2

## 2018-02-25 NOTE — Care Management Important Message (Signed)
Important Message  Patient Details  Name: [name] MRN: [id number] Date of Birth: [birth year]   Medicare Important Message Given:  Yes    Refoel Palladino P Tracy Gerken 02/25/2018, 2:03 PM

## 2018-02-25 NOTE — Progress Notes (Addendum)
Physical Therapy Treatment Patient Details Name: [name] MRN: [id number] DOB: 11/23/1921 Today's Date: 02/25/2018    History of Present Illness 84 y.o. female admitted for SOB,productive cough, and wheezing. Pt was recently hospitalized for CHF exacerbation (d/c 02/15/18). Pt found to have acute hypoxic respiratory failure with COPD exacerbation. PMH includes but not limited to AAA, pacemaker, tachy brady syndrome, AVR, PVD, chronic diastolic CHF, and chronic a fib. She has a history of falls.    PT Comments    Pt was received in bed happy and willing to participate in PT.  For bed mobility she was Min A to elevate trunk/sit up and Max A to move hips to EOB. Pt c/o dizziness upon sitting up.  Pts SP02 was at 98% so she preceded to gait after pursed lip breathing to calm her down.  Pt was able to power up to begin standing but needed ModA to finish standing and complete gait training.  VC's were needed for safety during gait to stay inside the walker and avoid obstacles.  Pt experienced LOBx3 posteriorly to the right with Max A to keep her upright.  A chair follow was also used for safety and to transport the pt back to her room upon becoming fatigued.  Emphasis should be placed on maximizing activity tolerance and safety with mobility for future sessions.  From previous admission, patient has a lot of assist at home, however, if family is unable to provide adequate level of assist then recommend SNF.  If family can provide adequate level of assist then skilled Home health PT is fine.    Follow Up Recommendations  Home health PT;Supervision for mobility/OOB;Other (comment)(vs SNF)     Equipment Recommendations  None recommended by PT    Recommendations for Other Services       Precautions / Restrictions Precautions Precautions: Fall Precaution Comments: Pt gets dizzy quickly when sitting/standing Restrictions Weight Bearing Restrictions: No    Mobility  Bed Mobility Overal bed  mobility: Needs Assistance Bed Mobility: Supine to Sit     Supine to sit: Max assist     General bed mobility comments: Min A to elevate trunk and sit up.  Max A to move hips to EOB. Pt c/o dizziness upon sitting up.  SP02 at 98% immediately after sitting up.    Transfers Overall transfer level: Needs assistance Equipment used: Rolling walker (2 wheeled) Transfers: Sit to/from Stand Sit to Stand: Mod assist         General transfer comment: Pt could power up to begin standing but needed mod A to finish standing up.  Pt needeed multiple VC's to place hands on bed and put feet under body to stand up.    Ambulation/Gait Ambulation/Gait assistance: Mod assist;+2 safety/equipment(+2 for chair follow.) Ambulation Distance (Feet): 15 Feet Assistive device: Rolling walker (2 wheeled) Gait Pattern/deviations: Step-through pattern;Decreased step length - right;Decreased step length - left;Trunk flexed;Leaning posteriorly(Leaning posteriotly and to the Right) Gait velocity: very slow   General Gait Details: VC's to keep pt inside walker, for flexed trunk, to keep pt moving forward/straight, and to ambulate around obstacles.   LOBx3 posteriorly to the right.  Pt required transport back to room via rolling recliner.     Stairs            Wheelchair Mobility    Modified Rankin (Stroke Patients Only)       Balance Overall balance assessment: Needs assistance Sitting-balance support: No upper extremity supported;Feet supported  Standing balance support: Bilateral upper extremity supported   Standing balance comment: pt dizzy on standing                             Cognition Arousal/Alertness: Awake/alert Behavior During Therapy: WFL for tasks assessed/performed Overall Cognitive Status: Within Functional Limits for tasks assessed                                        Exercises      General Comments        Pertinent Vitals/Pain  Pain Assessment: Faces Pain Score: 3  Pain Location: chronic knee pain Pain Descriptors / Indicators: Grimacing Pain Intervention(s): Limited activity within patient's tolerance;Monitored during session;Repositioned    Home Living                      Prior Function            PT Goals (current goals can now be found in the care plan section) Acute Rehab PT Goals Patient Stated Goal: return home PT Goal Formulation: With patient/family Time For Goal Achievement: 03/07/18 Potential to Achieve Goals: Good Progress towards PT goals: Progressing toward goals    Frequency    Min 3X/week      PT Plan Current plan remains appropriate    Co-evaluation              AM-PAC PT "6 Clicks" Daily Activity  Outcome Measure  Difficulty turning over in bed (including adjusting bedclothes, sheets and blankets)?: Unable Difficulty moving from lying on back to sitting on the side of the bed? : A Lot Difficulty sitting down on and standing up from a chair with arms (e.g., wheelchair, bedside commode, etc,.)?: Unable Help needed moving to and from a bed to chair (including a wheelchair)?: Total Help needed walking in hospital room?: A Lot   6 Click Score: 7    End of Session Equipment Utilized During Treatment: Gait belt;Oxygen Activity Tolerance: Patient limited by fatigue Patient left: with call bell/phone within reach;with chair alarm set;with nursing/sitter in room;in chair Nurse Communication: Mobility status PT Visit Diagnosis: Unsteadiness on feet (R26.81);History of falling (Z91.81);Muscle weakness (generalized) (M62.81)     Time: 9381-8299 PT Time Calculation (min) (ACUTE ONLY): 32 min  Charges:  $Gait Training: 8-22 mins $Therapeutic Activity: 8-22 mins                    G Codes:       Terri Skains, SPTA [phone]    Terri Skains 02/25/2018, 4:18 PM

## 2018-02-25 NOTE — Progress Notes (Signed)
[facility] for coumadin Indication: atrial fibrillation  Allergies  Allergen Reactions  . Advair Diskus [Fluticasone-Salmeterol] Anaphylaxis  . Furosemide Shortness Of Breath    Tolerates torsemide  . Other Shortness Of Breath and Other (See Comments)    IODINATED CONTRAST MEDIA: Chills and fever also  . Salmeterol Anaphylaxis    THROAT CLOSES with Advair; tolerates formoterol and albuterol  . Iohexol Other (See Comments)    Desc:  Pt requires a 13 hr prep, when doing exam with contrast....fever, chills, dyspnea (slg/04/27/09--given 2 hour solumedrol/ benadryl prep--no reaction).    . Sulfonamide Derivatives Nausea And Vomiting  . Verapamil Hives, Nausea And Vomiting and Other (See Comments)    Loss of appetite also   . Adhesive [Tape] Other (See Comments)    TEARS and BRUISES SKIN  . Amoxicillin-Pot Clavulanate Nausea Only and Other (See Comments)    NAUSEA AND STOMACH PAIN  . Contrast Media [Iodinated Diagnostic Agents] Other (See Comments)    "Froze" and fever  . Ibuprofen Other (See Comments)    Has liver damage  . Levofloxacin Nausea Only and Other (See Comments)    NAUSEA AND STOMACH PAIN  . Macrobid [Nitrofurantoin Monohyd Macro] Nausea Only and Other (See Comments)    Severe stomach pain  . Latex Rash  . Penicillins Rash and Other (See Comments)    Has patient had a PCN reaction causing immediate rash, facial/tongue/throat swelling, SOB or lightheadedness with hypotension: Yes Has patient had a PCN reaction causing severe rash involving mucus membranes or skin necrosis: Unk Has patient had a PCN reaction that required hospitalization: Unk Has patient had a PCN reaction occurring within the last 10 years: No If all of the above answers are "NO", then may proceed with Cephalosporin use.    Vital Signs: Temp: 97.9 F (36.6 C) (04/11 0512) Temp Source: Oral (04/11 0512) BP: 115/80 (04/11 0512) Pulse Rate: 62 (04/11  0512)  Labs: Recent Labs    02/23/18 0617 02/24/18 0436 02/25/18 0443  HGB  --   --  13.6  HCT  --   --  43.6  PLT  --   --  116*  LABPROT 28.1* 23.0* 27.9*  INR 2.65 2.06 2.63  CREATININE  --  1.18* 1.09*   Estimated Creatinine Clearance: 26.7 mL/min (A) (by C-G formula based on SCr of 1.09 mg/dL (H)).  Medical History: Past Medical History:  Diagnosis Date  . AAA (abdominal aortic aneurysm) ([place])    a. 12/2011 U/S: 3.3 cm prox AAA.  [name] Anxiety   . Aortic insufficiency    a. S/P bioprosthetic AVR;  b. 02/2013 Echo: EF 50-55%, mild to mod AI (mean grad 1mmHg).  . Brain aneurysm    Hx of  . Chest pain    a. H/o nonobs cath;  b. 07/2013 Lexiscan MV: EF 66%, no ischemia.  . Chronic diastolic CHF (congestive heart failure) ([place])    a. 02/2013 Echo: EF 50-55%.  . Diverticulosis   . Gastritis   . Hemorrhoids   . Hepatitis B 1980's  . Hiatal hernia   . Hypertension   . Moderate mitral regurgitation    a. 02/2013 Echo: Mod MR.  . Moderate tricuspid regurgitation    a. 02/2013 Echo: Mod TR.  [name] Permanent atrial fibrillation ([place])    a. Chronic coumadin.  . Polymyalgia rheumatica ([place])   . Pulmonary hypertension ([place])    a. 02/2013 Echo: PASP 48mmHg.  . S/P aortic valve replacement with bioprosthetic valve  a. [facility].  . S/P placement of cardiac pacemaker    a.  08/2007 s/p MDT [place] PPM.  . Seizures ([place])   . Shingles   . Skin cancer    face, ear  and left hand.  . Tachy-brady syndrome ([place])    a. Severe in the setting of permanent atrial fibrillation with tachybrady syndrome;  b. 08/2007 s/p MDT [place] PPM.  . Thoracic aneurysm    a. 10/2011 CTA: 5.0 x 3.8 cm.   Scheduled:  . feeding supplement (GLUCERNA SHAKE)  237 mL Oral TID BM  . fluticasone  1 spray Each Nare Daily  . guaiFENesin  600 mg Oral BID  . hydrALAZINE  25 mg Oral Q8H  . insulin aspart  0-5 Units Subcutaneous QHS  . insulin aspart  0-9 Units Subcutaneous TID WC  .  ipratropium-albuterol  3 mL Nebulization BID  . latanoprost  1 drop Both Eyes QHS  . loratadine  10 mg Oral Daily  . LORazepam  0.5 mg Oral TID  . mouth rinse  15 mL Mouth Rinse BID  . methylPREDNISolone (SOLU-MEDROL) injection  40 mg Intravenous Q12H  . mometasone-formoterol  2 puff Inhalation BID  . pantoprazole  40 mg Oral Daily  . PARoxetine  10 mg Oral Daily  . polyethylene glycol  17 g Oral Daily  . potassium chloride  40 mEq Oral Daily  . torsemide  20 mg Oral Daily  . traMADol  25 mg Oral QHS  . traZODone  75 mg Oral QHS  . Warfarin - Pharmacist Dosing Inpatient   Does not apply q1800   Assessment: 84 yo female here with SOB. She is on coumadin PTA for afib (also history of bioprosthetic AVR) and pharmacy consulted to dose.   -INR up 2.06>2.63. CBC stable, no overt bleeding; Noted interaction with azithromycin. Patient has not been eating well, but will give slightly higher dose today to prevent subtherapeutic drop.   Home dose: 2.5mg /day except 5mg  on TuSa (last clinic visit 02/08/18; INR= 2.8)  Goal of Therapy:  Goal INR 2.5- 3.0 (per clinic notes) Monitor platelets by anticoagulation protocol: Yes   Plan:  -Coumadin 2.5mg  po today -Daily PT/INR  Georga Bora, PharmD Clinical Pharmacist 02/25/2018 8:33 AM

## 2018-02-25 NOTE — Progress Notes (Signed)
Hospitalist progress note   [name]  CWC:[id number] DOB: June 02, 1922 DOA: 02/20/2018 PCP: Jodi Marble, MD Specialists:    Brief Narrative:  84 year old female  Tachybradycardia syndrome, bioprosthetic AVR 04/2016, AAA 4-5 years prior, right brachial artery stenosis, TIA 2012, peripheral vascular disease, polymyalgia rheumatica on chronic prednisone, HTN, Once recurrent urinary tract infections Hemoptysis shortness of breath, pulmonary hypertension, numerous skin cancers, bilateral osteoarthritis  Patient was re-admitted 02/20/2018 with 3 pound weight gain and noticed platypnea on exam noted to also have bilateral expiratory wheezing with a BNP 230 and started also on treatment for COPD exacerbation with Solu-Medrol and azithromycin and Mucinex  Recent admission 3/29-02/15/18 acute decompensation of diastolic heart failure-switch from oral torsemide 20--->40 daily.  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 4/10 desat screen ordered 4/10  Acute/chronic heart failure TTE 60-65% creatinine currently 1.2 baseline 1.1 Diuresis as below Currently -3.1 L weight stabilizing at 5 Long discussion with patient family today about  low-salt choices  Metabolic alkalosis-bicarb 35, Cutting back Demadex dose to 20 mg from 40 on  4/10  Hypoxia probably secondary to multiple reasons--O2 sats dropped overnight into the 70s and will need nightly oxygen--will need outpatient OSA screening  Tachybradycardia syndrome, permanent atrial fibrillation chads score >5 on Coumadin-INR is therapeutic slight lower 2.6 -no arrythmia other than paced rhythmn  Polymyalgia rheumatica on chronic prednisone 7.5 daily-because she is on chronic prednisone for polymyalgia rheumatica w-slow taper down from 40 mg over the next month as her age and chronic prednisone use would likely be the cause for having less endogenous cortisol  Hyperglycemia A1c 63/31/19  Bipolar continue Paxil 10 daily, Trazodone 75 at bedtime and lorazepam 0.5 3 times daily  Htn-monitor blood pressures--trend has been 761 systolic--adding hydralazine 25 tid  DVT prophylaxis: Patient therapeutic on Coumadin Code Status:   DNR   family Communication:   Discussed with patient's niece on 4/11 and updated that potentially patient can be discharged tomorrow dIsposition Plan: Patient not ready for discharge patient hasCrackles still, is still on IV steroids and will need resolution of the rising metabolic alkalosis?  Treatment of the same?  Acetazolamide if does not resolve versus saline repletion-we will monitor with labs again in a.m.   Consultants:   None  Procedures:   None  Antimicrobials:   Azithromycin  Subjective:  Overall improved does feel less swollen but still short of breath and seems to be close to her chronic state Is not coughing up any sputum Does not have any fever or chills Tolerating diet No dark stool no tarry stool  Objective: Vitals:   02/25/18 0749 02/25/18 0948 02/25/18 1134 02/25/18 1448  BP:  100/75 104/67   Pulse:  87 75   Resp:      Temp:   97.7 F (36.5  C)   TempSrc:   Oral   SpO2: 93% 98% 95% 98%  Weight:      Height:         Intake/Output Summary (Last 24 hours) at 02/25/2018 1458 Last data filed at 02/25/2018 1353 Gross per 24 hour  Intake 600 ml  Output 2400 ml  Net -1800 ml   Filed Weights   02/23/18 0723 02/24/18 0703 02/25/18 0512  Weight: 61.5 kg (135 lb 9.6 oz) 59.7 kg (131 lb 9.6 oz) 59.3 kg (130 lb 12.8 oz)    Examination: EOMI NCAT multiple areas on nose with lesions that look papulovesicular Bruit absent, no jvd Chest overall is clear than prior she still has some mild crackles posterolaterally Holosystolic 6/6 murmur radiating up to carotids, chest still crackley Abdomen is soft with midline scar and umbilical hernia. Musculoskeletal power is 5/5 equally arcus senilis is noted smile is symmetric sensory is grossly intact She has no lower extremity edema does have some mild clawing of her toes Psych is euthymic    Data Reviewed: I have personally reviewed following labs and imaging studies  CBC: Recent Labs  Lab 02/20/18 1604 02/21/18 0523 02/22/18 0505 02/25/18 0443  WBC 4.3 3.3* 4.7 6.0  NEUTROABS 3.5  --   --  4.9  HGB 12.3 12.9 12.8 13.6  HCT 39.9 42.0 41.7 43.6  MCV 92.8 92.5 92.3 92.4  PLT 129* 129* 130* 833*   Basic Metabolic Panel: Recent Labs  Lab 02/20/18 1604 02/20/18 1819 02/21/18 0523 02/22/18 0505 02/24/18 0436 02/25/18 0443  NA 137  --  136 136 144 144  K 3.4*  --  4.3 3.9 3.2* 3.3*  CL 94*  --  95* 94* 95* 97*  CO2 28  --  25 28 34* 35*  GLUCOSE 185*  --  176* 179* 183* 164*  BUN 23*  --  27* 38* 45* 39*  CREATININE 1.15*  --  1.13* 1.25* 1.18* 1.09*  CALCIUM 8.9  --  8.8* 8.7* 8.5* 8.5*  MG  --  1.9  --   --  2.1  --    GFR: Estimated Creatinine Clearance: 26.7 mL/min (A) (by C-G formula based on SCr of 1.09 mg/dL (H)). Liver Function Tests: No results for input(s): AST, ALT, ALKPHOS, BILITOT, PROT, ALBUMIN in the last 168 hours. No results for input(s): LIPASE, AMYLASE in the last 168 hours. No results for input(s): AMMONIA in the last 168  hours. Coagulation Profile: Recent Labs  Lab 02/21/18 0523 02/22/18 0505 02/23/18 0617 02/24/18 0436 02/25/18 0443  INR 3.19 3.34 2.65 2.06 2.63   Cardiac Enzymes:  Radiology Studies: Reviewed images personally in health database   Scheduled Meds: . feeding supplement (GLUCERNA SHAKE)  237 mL Oral TID BM  . fluticasone  1 spray Each Nare Daily  . guaiFENesin  600 mg Oral BID  . hydrALAZINE  25 mg Oral Q8H  . insulin aspart  0-5 Units Subcutaneous QHS  . insulin aspart  0-9 Units Subcutaneous TID WC  . ipratropium-albuterol  3 mL Nebulization BID  . latanoprost  1 drop Both Eyes QHS  . loratadine  10 mg Oral Daily  . LORazepam  0.5 mg Oral TID  . mouth rinse  15 mL Mouth Rinse BID  . mometasone-formoterol  2 puff Inhalation BID  . pantoprazole  40 mg Oral Daily  . PARoxetine  10 mg Oral Daily  . polyethylene glycol  17 g Oral Daily  . potassium chloride  40 mEq Oral Daily  . [  START ON 02/26/2018] predniSONE  40 mg Oral QAC breakfast  . torsemide  20 mg Oral Daily  . traMADol  25 mg Oral QHS  . traZODone  75 mg Oral QHS  . warfarin  2.5 mg Oral ONCE-1800  . Warfarin - Pharmacist Dosing Inpatient   Does not apply q1800   Continuous Infusions:    LOS: 5 days    Time spent: [place], MD [facility]  If 7PM-7AM, please contact night-coverage [email or web address] Password [facility] 02/25/2018, 2:58 PM

## 2018-02-25 NOTE — Plan of Care (Signed)
  Problem: Health Behavior/Discharge Planning: Goal: Ability to manage health-related needs will improve Outcome: Progressing   Problem: Clinical Measurements: Goal: Ability to maintain clinical measurements within normal limits will improve Outcome: Progressing   Problem: Clinical Measurements: Goal: Will remain free from infection Outcome: Progressing   Problem: Clinical Measurements: Goal: Respiratory complications will improve Outcome: Progressing

## 2018-02-26 LAB — COMPREHENSIVE METABOLIC PANEL WITH GFR
ALT: 24 U/L (ref 14–54)
AST: 36 U/L (ref 15–41)
Albumin: 3.2 g/dL — ABNORMAL LOW (ref 3.5–5.0)
Alkaline Phosphatase: 75 U/L (ref 38–126)
Anion gap: 12 (ref 5–15)
BUN: 33 mg/dL — ABNORMAL HIGH (ref 6–20)
CO2: 34 mmol/L — ABNORMAL HIGH (ref 22–32)
Calcium: 8.5 mg/dL — ABNORMAL LOW (ref 8.9–10.3)
Chloride: 96 mmol/L — ABNORMAL LOW (ref 101–111)
Creatinine, Ser: 1.07 mg/dL — ABNORMAL HIGH (ref 0.44–1.00)
GFR calc Af Amer: 50 mL/min — ABNORMAL LOW
GFR calc non Af Amer: 43 mL/min — ABNORMAL LOW
Glucose, Bld: 229 mg/dL — ABNORMAL HIGH (ref 65–99)
Potassium: 3.6 mmol/L (ref 3.5–5.1)
Sodium: 142 mmol/L (ref 135–145)
Total Bilirubin: 2.2 mg/dL — ABNORMAL HIGH (ref 0.3–1.2)
Total Protein: 5.5 g/dL — ABNORMAL LOW (ref 6.5–8.1)

## 2018-02-26 LAB — CBC WITH DIFFERENTIAL/PLATELET
Basophils Absolute: 0 10*3/uL (ref 0.0–0.1)
Basophils Relative: 0 %
Eosinophils Absolute: 0 10*3/uL (ref 0.0–0.7)
Eosinophils Relative: 0 %
HCT: 44 % (ref 36.0–46.0)
Hemoglobin: 13.5 g/dL (ref 12.0–15.0)
Lymphocytes Relative: 5 %
Lymphs Abs: 0.4 10*3/uL — ABNORMAL LOW (ref 0.7–4.0)
MCH: 28.4 pg (ref 26.0–34.0)
MCHC: 30.7 g/dL (ref 30.0–36.0)
MCV: 92.4 fL (ref 78.0–100.0)
Monocytes Absolute: 0.4 10*3/uL (ref 0.1–1.0)
Monocytes Relative: 4 %
Neutro Abs: 7.7 10*3/uL (ref 1.7–7.7)
Neutrophils Relative %: 91 %
Platelets: 114 10*3/uL — ABNORMAL LOW (ref 150–400)
RBC: 4.76 MIL/uL (ref 3.87–5.11)
RDW: 15.7 % — ABNORMAL HIGH (ref 11.5–15.5)
WBC: 8.5 10*3/uL (ref 4.0–10.5)

## 2018-02-26 LAB — GLUCOSE, CAPILLARY
GLUCOSE-CAPILLARY: 247 mg/dL — AB (ref 65–99)
Glucose-Capillary: 107 mg/dL — ABNORMAL HIGH (ref 65–99)

## 2018-02-26 LAB — PROTIME-INR
INR: 3.31
Prothrombin Time: 33.4 s — ABNORMAL HIGH (ref 11.4–15.2)

## 2018-02-26 MED ORDER — WARFARIN SODIUM 5 MG PO TABS: ORAL_TABLET | ORAL | 3 refills | Status: AC

## 2018-02-26 MED ORDER — WARFARIN SODIUM 2 MG PO TABS
2.0000 mg | ORAL_TABLET | Freq: Once | ORAL | Status: AC
  Administered 2018-02-26: 2 mg via ORAL
  Filled 2018-02-26: qty 1

## 2018-02-26 MED ORDER — GUAIFENESIN ER 600 MG PO TB12: 600.0000 mg | ORAL_TABLET | Freq: Two times a day (BID) | ORAL | 0 refills | Status: AC

## 2018-02-26 MED ORDER — TORSEMIDE 20 MG PO TABS: 20.0000 mg | ORAL_TABLET | Freq: Every day | ORAL | 0 refills | Status: DC

## 2018-02-26 MED ORDER — PREDNISONE 20 MG PO TABS: 40.0000 mg | ORAL_TABLET | Freq: Every day | ORAL | 0 refills | Status: AC

## 2018-02-26 MED ORDER — POTASSIUM CHLORIDE CRYS ER 20 MEQ PO TBCR: 40.0000 meq | EXTENDED_RELEASE_TABLET | Freq: Two times a day (BID) | ORAL | 0 refills | Status: DC

## 2018-02-26 NOTE — Progress Notes (Signed)
SATURATION QUALIFICATIONS: (This note is used to comply with regulatory documentation for home oxygen)  Patient Saturations on Room Air at Rest = 90%  Patient Saturations on Room Air while Ambulating = 84%  Patient Saturations on 2 Liters of oxygen while Ambulating = 98%  Please briefly explain why patient needs home oxygen:

## 2018-02-26 NOTE — Progress Notes (Signed)
Called case manger to inform of oxygen saturation evaluation completed  CM aware

## 2018-02-26 NOTE — Discharge Instructions (Signed)

## 2018-02-26 NOTE — Discharge Summary (Signed)
Physician Discharge Summary  [name] NTZ:[id number] DOB: 10-28-1922 DOA: 02/20/2018  PCP: Jodi Marble, MD  Admit date: 02/20/2018 Discharge date: 02/26/2018  Time spent: 35 minutes  Recommendations for Outpatient Follow-up:  1. Patient should have bmet as well as dose adjustment of Demadex as an outpatient in about 7 days-new dose of KCl 40 twice daily 10 tablets 2. Note dosage change of prednisone from 7.5 mg daily to 40 mg which will continue for at least 20 days and patient will need refills on these medications and gradual tapering as an outpatient by PCP 3. Hold Coumadin until 4/13 and then INR in about 4-5 days 4. New medication guaifenesin given this admission 5. Might need oxygen 2 L on discharge 6. Will need repeat echo 3-6 months as has AVR repair 7. 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--also may need outpatient OSA titration and initiation  Discharge Diagnoses:  Principal Problem:   Acute respiratory failure with hypoxia ([place]) Active Problems:   DIASTOLIC HEART FAILURE, CHRONIC   PACEMAKER-[employer]   Hypertension   Hyperglycemia   Discharge Condition: Improved  Diet recommendation: Heart healthy low-salt  Filed Weights   02/24/18 0703 02/25/18 0512 02/26/18 0427  Weight: 59.7 kg (131 lb 9.6 oz) 59.3 kg (130 lb 12.8 oz) 58.9 kg (129 lb 14.4 oz)    History of present illness:  84 year old female  Tachybradycardia syndrome, bioprosthetic AVR 04/2016, AAA 4-5 years prior, right brachial artery stenosis, TIA 2012, peripheral vascular disease, polymyalgia rheumatica on chronic prednisone, HTN, Once recurrent urinary tract infections Hemoptysis shortness of breath, pulmonary hypertension, numerous skin cancers, bilateral osteoarthritis  Patient was re-admitted 02/20/2018 with 3 pound weight gain and noticed platypnea on exam noted to also have bilateral expiratory wheezing with a  BNP 230 and started also on treatment for COPD exacerbation with Solu-Medrol and azithromycin and Mucinex  Recent admission 3/29-02/15/18 acute decompensation of diastolic heart failure-switch from oral torsemide 20--->40 daily.  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 -On admission chest x-ray no pneumonia, flutter valve started Solu-Medrol dose was cut back  Mild improvement 4/10 we will continue Solu-Medrol at current dose and consider transition to oral steroids in 24 hours--patient was resumed on her home dose of chronic oral steroids 7.5 on discharge Will probably need oxygen on discharge home  Acute/chronic heart failure TTE 60-65% creatinine currently 1.2 baseline 1.1 Diuresis as below Currently -3.3 L weight stabilizing at 45 Long discussion with patient family today about low-salt choices  Metabolic alkalosis-bicarb 35, Cutting back Demadex dose to 20 mg from 40 on  4/10 discussed with Dr. Posey Pronto who recommends replacement of KCl which will help with--balancing the alkalosis and patient can have labs in about a week  Hypoxia probably secondary to multiple reasons--O2 sats dropped overnight into the 70s and will need nightly oxygen--will need outpatient OSA screening  Tachybradycardia syndrome, permanent atrial fibrillation chads score >5 on Coumadin-INR is therapeutic slight lower 2.6 -no arrythmia other than paced rhythmn  Polymyalgia rheumatica on chronic prednisone 7.5 daily-because she is on chronic prednisone for polymyalgia rheumatica w-slow  taper down from 40 mg for the next 2 weeks and then will need probably lowering at the outpatient setting by her regular physician  Hyperglycemia A1c 6   02/14/18  Bipolar continue Paxil 10 daily, Trazodone 75 at bedtime and lorazepam 0.5 3 times daily  Htn-monitor blood pressures    Discharge Exam: Vitals:   02/26/18 0906 02/26/18 0915  BP: (!) 155/57 103/86  Pulse: 77 82  Resp:    Temp:    SpO2:      General: Awake alert pleasant still frail oriented no distress Cardiovascular: S1-S2 no murmur rub or gallop  Respiratory: no added sound anteriorly think some crackles bilaterally prednisone no rales no rhonchi no fremitus no residence Abdomen has a hernia Lower extremities are soft nontender Mild JVD  Discharge Instructions   Discharge Instructions    Diet - low sodium heart healthy   Complete by:  As directed    Discharge instructions   Complete by:  As directed    Please note dosage changes to multiple medications-you will need to take less of your Demadex and an increased dose of your potassium You will need an INR check in about a week Please get an x-ray at your primary care physician's office in about a month You will probably need oxygen and we will order this on discharge 2 L at all times   Increase activity slowly   Complete by:  As directed      Allergies as of 02/26/2018      Reactions   Advair Diskus [fluticasone-salmeterol] Anaphylaxis   Furosemide Shortness Of Breath   Tolerates torsemide   Other Shortness Of Breath, Other (See Comments)   IODINATED CONTRAST MEDIA: Chills and fever also   Salmeterol Anaphylaxis   THROAT CLOSES with Advair; tolerates formoterol and albuterol   Iohexol Other (See Comments)   Desc:  Pt requires a 13 hr prep, when doing exam with contrast....fever, chills, dyspnea (slg/04/27/09--given 2 hour solumedrol/ benadryl prep--no reaction).     Sulfonamide Derivatives Nausea And Vomiting   Verapamil Hives, Nausea And Vomiting,  Other (See Comments)   Loss of appetite also   Adhesive [tape] Other (See Comments)   TEARS and BRUISES SKIN   Amoxicillin-pot Clavulanate Nausea Only, Other (See Comments)   NAUSEA AND STOMACH PAIN   Contrast Media [iodinated Diagnostic Agents] Other (See Comments)   "Froze" and fever   Ibuprofen Other (See Comments)   Has liver damage   Levofloxacin Nausea Only, Other (See Comments)   NAUSEA AND STOMACH PAIN   Macrobid [nitrofurantoin Monohyd Macro] Nausea Only, Other (See Comments)   Severe stomach pain   Latex Rash   Penicillins Rash, Other (See Comments)   Has patient had a PCN reaction causing immediate rash, facial/tongue/throat swelling, SOB or lightheadedness with hypotension: Yes Has patient had a PCN reaction causing severe rash involving mucus membranes or skin necrosis: Unk Has patient had a PCN reaction that required hospitalization: Unk Has patient had a PCN reaction occurring within the last 10 years: No If all of the above answers are "NO", then may proceed with Cephalosporin use.      Medication List    TAKE these medications   acetaminophen 500 MG tablet Commonly known as:  TYLENOL Take 1 tablet (500 mg total) by mouth every 6 (six) hours. What changed:    when to take this  reasons to take this   albuterol 108 (90 Base) MCG/ACT inhaler Commonly known as:  PROVENTIL HFA;VENTOLIN HFA Inhale 1 puff  into the lungs every 6 (six) hours as needed for wheezing or shortness of breath. What changed:  Another medication with the same name was changed. Make sure you understand how and when to take each.   albuterol (2.5 MG/3ML) 0.083% nebulizer solution Commonly known as:  PROVENTIL Inhale 1 vial via nebulizer every 6 hours as needed .J44.9 What changed:    how much to take  how to take this  when to take this  additional instructions   AMBULATORY NON FORMULARY MEDICATION Medication Name: [employer] DME:[facility] DX:J98.11   bacitracin ointment Apply 1  application topically 2 (two) times daily. Apply to skin tears, then place a non-stick dressing   budesonide-formoterol 160-4.5 MCG/ACT inhaler Commonly known as:  SYMBICORT Inhale 1 puff into the lungs 2 (two) times daily.   diclofenac sodium 1 % Gel Commonly known as:  VOLTAREN Apply 4 g topically See admin instructions. Apply 4 g topically to both knees two times a day   flintstones complete 60 MG chewable tablet Chew 1 tablet by mouth daily.   guaiFENesin 600 MG 12 hr tablet Commonly known as:  MUCINEX Take 1 tablet (600 mg total) by mouth 2 (two) times daily.   ipratropium 0.02 % nebulizer solution Commonly known as:  ATROVENT Take 2.5 mLs (0.5 mg total) by nebulization 4 (four) times daily. As needed J44.9 What changed:    when to take this  additional instructions   latanoprost 0.005 % ophthalmic solution Commonly known as:  XALATAN Place 1 drop into both eyes at bedtime.   LORazepam 0.5 MG tablet Commonly known as:  ATIVAN Take 1 tablet (0.5 mg total) by mouth every 8 (eight) hours as needed for anxiety. What changed:  when to take this   meclizine 25 MG tablet Commonly known as:  ANTIVERT Take 12.5 mg by mouth 3 (three) times daily as needed for dizziness.   pantoprazole 40 MG tablet Commonly known as:  PROTONIX Take 40 mg by mouth daily.   PARoxetine 10 MG tablet Commonly known as:  PAXIL Take 10 mg by mouth daily.   polyethylene glycol packet Commonly known as:  MIRALAX / GLYCOLAX Take 17 g by mouth daily as needed for moderate constipation. What changed:    when to take this  additional instructions   potassium chloride SA 20 MEQ tablet Commonly known as:  K-DUR,KLOR-CON Take 2 tablets (40 mEq total) by mouth 2 (two) times daily. What changed:  See the new instructions.   predniSONE 20 MG tablet Commonly known as:  DELTASONE Take 2 tablets (40 mg total) by mouth daily before breakfast. Start taking on:  02/27/2018 What changed:     medication strength  how much to take  when to take this   SIMBRINZA 1-0.2 % Susp Generic drug:  Brinzolamide-Brimonidine Place 1 drop into both eyes daily.   torsemide 20 MG tablet Commonly known as:  DEMADEX Take 1 tablet (20 mg total) by mouth daily. Start taking on:  02/27/2018 What changed:  how much to take   traMADol 50 MG tablet Commonly known as:  ULTRAM Take 1 tablet (50 mg total) by mouth every 6 (six) hours as needed for moderate pain. What changed:    how much to take  when to take this   traZODone 50 MG tablet Commonly known as:  DESYREL Take 75 mg by mouth at bedtime.   warfarin 5 MG tablet Commonly known as:  COUMADIN Take as directed. If you are unsure how to take this medication,  talk to your nurse or doctor. Original instructions:  TAKE AS DIRECTED BY THE COUMADIN CLINIC Start taking on:  02/27/2018 What changed:    how much to take  how to take this  when to take this  additional instructions            Durable Medical Equipment  (From admission, onward)        Start     Ordered   02/26/18 1100  DME Oxygen  Once    Question Answer Comment  Mode or (Route) Nasal cannula   Liters per Minute 2   Oxygen delivery system Gas      02/26/18 1101     Allergies  Allergen Reactions  . Advair Diskus [Fluticasone-Salmeterol] Anaphylaxis  . Furosemide Shortness Of Breath    Tolerates torsemide  . Other Shortness Of Breath and Other (See Comments)    IODINATED CONTRAST MEDIA: Chills and fever also  . Salmeterol Anaphylaxis    THROAT CLOSES with Advair; tolerates formoterol and albuterol  . Iohexol Other (See Comments)    Desc:  Pt requires a 13 hr prep, when doing exam with contrast....fever, chills, dyspnea (slg/04/27/09--given 2 hour solumedrol/ benadryl prep--no reaction).    . Sulfonamide Derivatives Nausea And Vomiting  . Verapamil Hives, Nausea And Vomiting and Other (See Comments)    Loss of appetite also   . Adhesive [Tape]  Other (See Comments)    TEARS and BRUISES SKIN  . Amoxicillin-Pot Clavulanate Nausea Only and Other (See Comments)    NAUSEA AND STOMACH PAIN  . Contrast Media [Iodinated Diagnostic Agents] Other (See Comments)    "Froze" and fever  . Ibuprofen Other (See Comments)    Has liver damage  . Levofloxacin Nausea Only and Other (See Comments)    NAUSEA AND STOMACH PAIN  . Macrobid [Nitrofurantoin Monohyd Macro] Nausea Only and Other (See Comments)    Severe stomach pain  . Latex Rash  . Penicillins Rash and Other (See Comments)    Has patient had a PCN reaction causing immediate rash, facial/tongue/throat swelling, SOB or lightheadedness with hypotension: Yes Has patient had a PCN reaction causing severe rash involving mucus membranes or skin necrosis: Unk Has patient had a PCN reaction that required hospitalization: Unk Has patient had a PCN reaction occurring within the last 10 years: No If all of the above answers are "NO", then may proceed with Cephalosporin use.    [place] Follow up.   Why:  They will continue to do your home health care at your home Contact information: [address] [place] [address] [phone]            The results of significant diagnostics from this hospitalization (including imaging, microbiology, ancillary and laboratory) are listed below for reference.    Significant Diagnostic Studies: Dg Chest 2 View  Result Date: 02/20/2018 CLINICAL DATA:  Pt complains of SOB x 2 weeks with occasional generalized CP; also reports "terrible" cough but unsure of timeframe; emesis x 2 today; pt here within the last week and dx w/ CHF per ED notes; hx of HTN treatment and pacemaker insertion; non-smoker; chart also shows hx of AAA EXAM: CHEST - 2 VIEW COMPARISON:  02/12/2018 FINDINGS: Cardiac silhouette is enlarged. There stable changes from prior cardiac surgery and valve replacement. There is significant  enlargement of the thoracic aorta, particularly the aortic knob, stable from the prior study. No mediastinal or hilar masses. Small bilateral pleural  effusions. Lungs demonstrate thickened bronchovascular markings. Small area of focal opacity noted in the right mid lung likely atelectasis or fluid along the minor fissure. No pneumothorax. Findings are without significant change from the most recent prior chest radiograph. Left anterior chest wall single lead pacemaker is stable. IMPRESSION: 1. Cardiomegaly, prominent bronchovascular markings and small pleural effusions. Suspect mild congestive heart failure. Findings are stable when compared to the most recent prior chest radiograph. 2. Thoracic aortic aneurysm. Electronically Signed   By: Lajean Manes M.D.   On: 02/20/2018 15:49   Dg Chest 2 View  Result Date: 02/12/2018 CLINICAL DATA:  Shortness of breath.  Cough. EXAM: CHEST - 2 VIEW COMPARISON:  Chest x-ray 02/09/2018. FINDINGS: Thoracic aortic prominence again noted consistent with known thoracic aortic aneurysm. No change from prior exam cardiac pacer with lead tip over the right ventricle. Prior CABG. Prior cardiac valve replacement. Bilateral mild interstitial prominence and bilateral pleural effusions. Findings suggest mild CHF. Mild basilar atelectasis again noted. IMPRESSION: 1. Cardiac pacer stable position. Cardiac valve replacement. Cardiomegaly with mild increase in bilateral interstitial prominence and small bilateral pleural effusions again noted. Findings consistent with CHF. Persistent mild bibasilar atelectasis. 2.  Thoracic aortic aneurysm again noted.  No interim change. Electronically Signed   By: Marcello Moores  Register   On: 02/12/2018 15:15   Dg Chest 2 View  Result Date: 02/10/2018 CLINICAL DATA:  Shortness of breath, chest pain. EXAM: CHEST - 2 VIEW COMPARISON:  Radiograph of Apr 15, 2017. FINDINGS: Stable cardiomegaly. Aneurysmal dilatation of descending thoracic aorta is noted.  Atherosclerosis of abdominal aorta. Stable position of left-sided pacemaker. Status post aortic valve replacement. No pneumothorax is noted. Mild bibasilar subsegmental atelectasis is noted. Mild right pleural effusion is noted. IMPRESSION: Mild bibasilar subsegmental atelectasis. Mild right pleural effusion. Thoracic aortic aneurysm is noted. CTA of the chest is recommended for further evaluation. Aortic Atherosclerosis (ICD10-I70.0). Electronically Signed   By: Marijo Conception, M.D.   On: 02/10/2018 08:48    Microbiology: No results found for this or any previous visit (from the past 240 hour(s)).   Labs: Basic Metabolic Panel: Recent Labs  Lab 02/20/18 1604 02/20/18 1819 02/21/18 0523 02/22/18 0505 02/24/18 0436 02/25/18 0443  NA 137  --  136 136 144 144  K 3.4*  --  4.3 3.9 3.2* 3.3*  CL 94*  --  95* 94* 95* 97*  CO2 28  --  25 28 34* 35*  GLUCOSE 185*  --  176* 179* 183* 164*  BUN 23*  --  27* 38* 45* 39*  CREATININE 1.15*  --  1.13* 1.25* 1.18* 1.09*  CALCIUM 8.9  --  8.8* 8.7* 8.5* 8.5*  MG  --  1.9  --   --  2.1  --    Liver Function Tests: No results for input(s): AST, ALT, ALKPHOS, BILITOT, PROT, ALBUMIN in the last 168 hours. No results for input(s): LIPASE, AMYLASE in the last 168 hours. No results for input(s): AMMONIA in the last 168 hours. CBC: Recent Labs  Lab 02/20/18 1604 02/21/18 0523 02/22/18 0505 02/25/18 0443  WBC 4.3 3.3* 4.7 6.0  NEUTROABS 3.5  --   --  4.9  HGB 12.3 12.9 12.8 13.6  HCT 39.9 42.0 41.7 43.6  MCV 92.8 92.5 92.3 92.4  PLT 129* 129* 130* 116*   Cardiac Enzymes: Recent Labs  Lab 02/20/18 1604 02/20/18 1819 02/20/18 2322 02/21/18 0523  TROPONINI 0.09* 0.08* 0.08* 0.08*   BNP: BNP (last 3 results) Recent Labs  02/12/18 1449 02/14/18 0540 02/20/18 1604  BNP 137.6* 178.6* 230.0*    ProBNP (last 3 results) No results for input(s): PROBNP in the last 8760 hours.  CBG: Recent Labs  Lab 02/25/18 0749 02/25/18 1131  02/25/18 1635 02/25/18 2118 02/26/18 0801  GLUCAP 164* 261* 111* 156* 107*       Signed:  Nita Sells MD   [facility] 02/26/2018, 11:02 AM

## 2018-02-26 NOTE — Progress Notes (Signed)
Educated pt on how to use flutter value, pt demonstrated correct use

## 2018-02-26 NOTE — Progress Notes (Signed)
Physical Therapy Treatment Patient Details Name: [name] MRN: [id number] DOB: [birth year] Today's Date: 02/26/2018    History of Present Illness 84 y.o. female admitted for SOB, productive cough, and wheezing; found to have acute hypoxic respiratory failure with COPD exacerbation. Of note, recently d/c 02/15/18 for CHF exacerbation. PMH includes AAA, pacemaker, AVR, PVD, CHF, a-fib.    PT Comments    Pt plans to discharge home today with home O2. Requires modA to stand with RW; modA to maintain balance while ambulating short distance to chair. Pt with DOE 3/4 after short bouts of activity; SpO2 95% on 2L O2 [place]. Niece present at end of session. Pt lives with niece who works from home; niece reports she is able to provide physical assist and they own all necessary DME (including w/c and elevator from garage into home). No further questions or concerns for PT at this time. If pt remains admitted, will follow acutely.   Follow Up Recommendations  Home health PT;Supervision for mobility/OOB(refusing SNF)     Equipment Recommendations  None recommended by PT    Recommendations for Other Services       Precautions / Restrictions Precautions Precautions: Fall Restrictions Weight Bearing Restrictions: No    Mobility  Bed Mobility Overal bed mobility: Needs Assistance Bed Mobility: Supine to Sit     Supine to sit: Min assist     General bed mobility comments: Significant time and effort (10+ minutes) to come to EOB without physical assist, reliant on hand rail for trunk elevation. 1x minA to assist hips to EOB, but pt able to scoot fowards with increased time. DOE 3/4 doing this  Transfers Overall transfer level: Needs assistance Equipment used: Rolling walker (2 wheeled) Transfers: Sit to/from Stand Sit to Stand: Mod assist         General transfer comment: Unable to stand on first attempt. Able to on second attempt with cues for hand placement, bending forward, and modA  for trunk elevation. Increased time and effort. Max cues for bilat hip ext and fully upright posture as pt losing balance posteriorly  Ambulation/Gait Ambulation/Gait assistance: Mod assist Ambulation Distance (Feet): 3 Feet Assistive device: Rolling walker (2 wheeled) Gait Pattern/deviations: Step-to pattern;Leaning posteriorly;Narrow base of support;Trunk flexed Gait velocity: Decreased Gait velocity interpretation: <1.31 ft/sec, indicative of household ambulator General Gait Details: Slow, unsteady amb from bed to recliner with RW and modA to maintain balance. Pt required cues for sequencing with RW throughout. DOE 3/4 upon sitting in chair. SpO2 95% on 2L O2 [place]   Stairs             Wheelchair Mobility    Modified Rankin (Stroke Patients Only)       Balance Overall balance assessment: Needs assistance Sitting-balance support: No upper extremity supported;Feet supported Sitting balance-Leahy Scale: Fair     Standing balance support: Bilateral upper extremity supported Standing balance-Leahy Scale: Poor Standing balance comment: Reliant on BUE support and modA                            Cognition Arousal/Alertness: Awake/alert Behavior During Therapy: WFL for tasks assessed/performed Overall Cognitive Status: No family/caregiver present to determine baseline cognitive functioning Area of Impairment: Problem solving                             Problem Solving: Slow processing;Requires verbal cues;Decreased initiation  Exercises      General Comments General comments (skin integrity, edema, etc.): Niece (caregiver) present at end of session      Pertinent Vitals/Pain Pain Assessment: No/denies pain    Home Living                      Prior Function            PT Goals (current goals can now be found in the care plan section) Acute Rehab PT Goals Patient Stated Goal: return home PT Goal Formulation: With  patient/family Time For Goal Achievement: 03/07/18 Potential to Achieve Goals: Good Progress towards PT goals: Progressing toward goals    Frequency    Min 3X/week      PT Plan Current plan remains appropriate    Co-evaluation              AM-PAC PT "6 Clicks" Daily Activity  Outcome Measure  Difficulty turning over in bed (including adjusting bedclothes, sheets and blankets)?: A Little Difficulty moving from lying on back to sitting on the side of the bed? : A Little Difficulty sitting down on and standing up from a chair with arms (e.g., wheelchair, bedside commode, etc,.)?: Unable Help needed moving to and from a bed to chair (including a wheelchair)?: A Little Help needed walking in hospital room?: A Lot Help needed climbing 3-5 steps with a railing? : Total 6 Click Score: 13    End of Session Equipment Utilized During Treatment: Gait belt;Oxygen Activity Tolerance: Patient limited by fatigue Patient left: in chair;with call bell/phone within reach;with chair alarm set;with family/visitor present Nurse Communication: Mobility status PT Visit Diagnosis: Unsteadiness on feet (R26.81);History of falling (Z91.81);Muscle weakness (generalized) (M62.81)     Time: 3710-6269 PT Time Calculation (min) (ACUTE ONLY): 24 min  Charges:  $Therapeutic Activity: 23-37 mins                    G Codes:      Mabeline Caras, PT, DPT Acute Rehab Services  Pager: [place] 02/26/2018, 2:10 PM

## 2018-02-26 NOTE — Progress Notes (Signed)
Nutrition Follow-up  DOCUMENTATION CODES:   Not applicable  INTERVENTION:  Recommend liberalize diet to regular Ensure Enlive po TID, each supplement provides 350 kcal and 20 grams of protein Magic cup BID with meals, each supplement provides 290 kcal and 9 grams of protein MVI - daily  NUTRITION DIAGNOSIS:   Increased nutrient needs related to chronic illness(CHF, COPD) as evidenced by estimated needs.  Ongoing  GOAL:   Patient will meet greater than or equal to 90% of their needs  Ongoing  MONITOR:   PO intake, Supplement acceptance, Labs, I & O's, Skin, Weight trends  REASON FOR ASSESSMENT:   Malnutrition Screening Tool    ASSESSMENT:   84 y.o. F admitted for acute respiratory failure with hypoxia. PMH of COPD, a.fib, tachy-brady syndrome, pacemaker, hypertension, aneurysms (brain, thoracic, abdominal), aortic insufficiency, CHF, diverticulosis, hemorrhoids, gastritis, hepatitis B, skin cancer, seizures, hiatal hernia,and polymyalgia rheumatica. PSH of cholecystectomy.    PO intake of meals 0-25%, some with refusal of meal after a couple of bites. RN reports that pt has been consuming at least 50% of her Glucerna shakes, but she is barely eating any food; RN suspects this is due to her anxiety.   Pt reports that her appetite is still very poor. Pt agrees that she needs to optimize her nutrition to feel better and wants to start eating again; pt is open to any supplement interventions and states that she loves ice cream.    Current supplements: Glucerna PO TID Recommend changing to a high calorie supplement as pt PO intake is poor; Ensure Enlive provides 350 kcal and 20 grams of protein while Glucerna  provides 220 kcal and 10 grams of protein  Medications reviewed: novolog 0-5 & 0-9 units, ativan, protonix, miralax, potassium chloride SA, prednisone, ultram, warfarin, demadex, desyrel.   Labs reviewed: PTT 33.4 (H). 02/25/18: K+ 3.3 (L), Cl 97 (L), CO2 35 (H), BG 164  (H), BUN 39 (H), creatinine 1.09 (H), GFR 42 (L).    Intake/Output Summary (Last 24 hours) at 02/26/2018 1026 Last data filed at 02/26/2018 0932 Gross per 24 hour  Intake 720 ml  Output 850 ml  Net -130 ml     NUTRITION - FOCUSED PHYSICAL EXAM:    Most Recent Value  Orbital Region  Mild depletion  Upper Arm Region  Moderate depletion  Thoracic and Lumbar Region  Moderate depletion  Buccal Region  Mild depletion  Temple Region  Mild depletion  Clavicle Bone Region  Moderate depletion  Clavicle and Acromion Bone Region  Moderate depletion  Scapular Bone Region  Unable to assess  Dorsal Hand  Moderate depletion  Patellar Region  Mild depletion  Anterior Thigh Region  Mild depletion  Posterior Calf Region  Mild depletion  Edema (RD Assessment)  Mild  Hair  Reviewed  Eyes  Reviewed  Mouth  Reviewed  Skin  Reviewed  Nails  Reviewed      Diet Order:  Diet heart healthy/carb modified Room service appropriate? Yes; Fluid consistency: Thin  EDUCATION NEEDS:   Education needs have been addressed(discussed with pt that her energy and protein needs have increased due to her CHF and COPD)  Skin:  Skin Assessment: Skin Integrity Issues: Skin Integrity Issues:: Other (Comment) Other: ecchymosis L/R arm, skin tear R arm  Last BM:  02/19/18  Height:   Ht Readings from Last 1 Encounters:  02/22/18 5\' 3"  (1.6 m)    Weight:   Wt Readings from Last 1 Encounters:  02/26/18 129 lb 14.4 oz (  58.9 kg)   02/20/18: 62.6 kg --> 02/26/18: 58.9 kg % admission body weight: 94%   Ideal Body Weight:  52.27 kg  BMI:  Body mass index is 23.01 kg/m.  Estimated Nutritional Needs:   Kcal:  1300-1500 kcal  Protein:  65-75 grams  Fluid:  1.3-1.5 L or per MD   Hope Budds, Dietetic Intern

## 2018-02-26 NOTE — Progress Notes (Signed)
Received message from [facility] with [place]; patient is active with [facility] as prior to admission; awaiting for 02 sats to determine if patient will qualify for home oxygen; B Pennie Rushing [phone]

## 2018-02-26 NOTE — Progress Notes (Signed)
Dressing changed on right forearm skin tear.

## 2018-02-26 NOTE — Progress Notes (Signed)
[place] for coumadin Indication: atrial fibrillation  Allergies  Allergen Reactions  . Advair Diskus [Fluticasone-Salmeterol] Anaphylaxis  . Furosemide Shortness Of Breath    Tolerates torsemide  . Other Shortness Of Breath and Other (See Comments)    IODINATED CONTRAST MEDIA: Chills and fever also  . Salmeterol Anaphylaxis    THROAT CLOSES with Advair; tolerates formoterol and albuterol  . Iohexol Other (See Comments)    Desc:  Pt requires a 13 hr prep, when doing exam with contrast....fever, chills, dyspnea (slg/04/27/09--given 2 hour solumedrol/ benadryl prep--no reaction).    . Sulfonamide Derivatives Nausea And Vomiting  . Verapamil Hives, Nausea And Vomiting and Other (See Comments)    Loss of appetite also   . Adhesive [Tape] Other (See Comments)    TEARS and BRUISES SKIN  . Amoxicillin-Pot Clavulanate Nausea Only and Other (See Comments)    NAUSEA AND STOMACH PAIN  . Contrast Media [Iodinated Diagnostic Agents] Other (See Comments)    "Froze" and fever  . Ibuprofen Other (See Comments)    Has liver damage  . Levofloxacin Nausea Only and Other (See Comments)    NAUSEA AND STOMACH PAIN  . Macrobid [Nitrofurantoin Monohyd Macro] Nausea Only and Other (See Comments)    Severe stomach pain  . Latex Rash  . Penicillins Rash and Other (See Comments)    Has patient had a PCN reaction causing immediate rash, facial/tongue/throat swelling, SOB or lightheadedness with hypotension: Yes Has patient had a PCN reaction causing severe rash involving mucus membranes or skin necrosis: Unk Has patient had a PCN reaction that required hospitalization: Unk Has patient had a PCN reaction occurring within the last 10 years: No If all of the above answers are "NO", then may proceed with Cephalosporin use.    Vital Signs: Temp: 97.5 F (36.4 C) (04/12 0824) Temp Source: Oral (04/12 0824) BP: 103/86 (04/12 0915) Pulse Rate: 82 (04/12  0915)  Labs: Recent Labs    02/24/18 0436 02/25/18 0443 02/26/18 0409  HGB  --  13.6  --   HCT  --  43.6  --   PLT  --  116*  --   LABPROT 23.0* 27.9* 33.4*  INR 2.06 2.63 3.31  CREATININE 1.18* 1.09*  --    Estimated Creatinine Clearance: 26.7 mL/min (A) (by C-G formula based on SCr of 1.09 mg/dL (H)).  Medical History: Past Medical History:  Diagnosis Date  . AAA (abdominal aortic aneurysm) ([place])    a. 12/2011 U/S: 3.3 cm prox AAA.  [name] Anxiety   . Aortic insufficiency    a. S/P bioprosthetic AVR;  b. 02/2013 Echo: EF 50-55%, mild to mod AI (mean grad 68mmHg).  . Brain aneurysm    Hx of  . Chest pain    a. H/o nonobs cath;  b. 07/2013 Lexiscan MV: EF 66%, no ischemia.  . Chronic diastolic CHF (congestive heart failure) ([place])    a. 02/2013 Echo: EF 50-55%.  . Diverticulosis   . Gastritis   . Hemorrhoids   . Hepatitis B 1980's  . Hiatal hernia   . Hypertension   . Moderate mitral regurgitation    a. 02/2013 Echo: Mod MR.  . Moderate tricuspid regurgitation    a. 02/2013 Echo: Mod TR.  [name] Permanent atrial fibrillation ([place])    a. Chronic coumadin.  . Polymyalgia rheumatica ([place])   . Pulmonary hypertension ([place])    a. 02/2013 Echo: PASP 11mmHg.  . S/P aortic valve replacement with bioprosthetic valve  a. [facility]  . S/P placement of cardiac pacemaker    a.  08/2007 s/p MDT [place] PPM.  . Seizures ([place])   . Shingles   . Skin cancer    face, ear  and left hand.  . Tachy-brady syndrome ([place])    a. Severe in the setting of permanent atrial fibrillation with tachybrady syndrome;  b. 08/2007 s/p MDT [place] PPM.  . Thoracic aneurysm    a. 10/2011 CTA: 5.0 x 3.8 cm.   Scheduled:  . feeding supplement (GLUCERNA SHAKE)  237 mL Oral TID BM  . fluticasone  1 spray Each Nare Daily  . guaiFENesin  600 mg Oral BID  . hydrALAZINE  25 mg Oral Q8H  . insulin aspart  0-5 Units Subcutaneous QHS  . insulin aspart  0-9 Units Subcutaneous TID WC  .  latanoprost  1 drop Both Eyes QHS  . loratadine  10 mg Oral Daily  . LORazepam  0.5 mg Oral TID  . mouth rinse  15 mL Mouth Rinse BID  . mometasone-formoterol  2 puff Inhalation BID  . pantoprazole  40 mg Oral Daily  . PARoxetine  10 mg Oral Daily  . polyethylene glycol  17 g Oral Daily  . potassium chloride  40 mEq Oral Daily  . predniSONE  40 mg Oral QAC breakfast  . torsemide  20 mg Oral Daily  . traMADol  25 mg Oral QHS  . traZODone  75 mg Oral QHS  . Warfarin - Pharmacist Dosing Inpatient   Does not apply q1800   Assessment: 84 yo female here with SOB. She is on coumadin PTA for afib (also history of bioprosthetic AVR) and pharmacy consulted to dose.   -INR has been highly variable during this admission. INR up today 2.06>2.63>3.31. CBC stable, no overt bleeding; Patient has not been eating well; will give conservative dose today, slightly below home regimen as the INR was 3.3 earlier during the admission and fell below ideal INR goal with warfarin dose reduction.    Home dose: 2.5mg /day except 5mg  on TuSa (last clinic visit 02/08/18; INR= 2.8)  Goal of Therapy:  Goal INR 2.5- 3.0 (per clinic notes) Monitor platelets by anticoagulation protocol: Yes   Plan:  -Coumadin 2mg  po today -Daily PT/INR  Georga Bora, PharmD Clinical Pharmacist 02/26/2018 10:31 AM

## 2018-02-26 NOTE — Progress Notes (Signed)
Jermane with [facility] called for home oxygen; portable oxygen tank to be delivered to the room today prior to discharging home and more oxygen tanks will be delivered to the home after discharge; B Pennie Rushing [phone]

## 2018-03-01 ENCOUNTER — Other Ambulatory Visit: Payer: Self-pay | Admitting: *Deleted

## 2018-03-01 ENCOUNTER — Telehealth: Payer: Self-pay | Admitting: Internal Medicine

## 2018-03-01 ENCOUNTER — Other Ambulatory Visit: Payer: Self-pay | Admitting: Internal Medicine

## 2018-03-01 MED ORDER — ALBUTEROL SULFATE (2.5 MG/3ML) 0.083% IN NEBU: 2.5000 mg | INHALATION_SOLUTION | Freq: Four times a day (QID) | RESPIRATORY_TRACT | 11 refills | Status: AC

## 2018-03-01 MED ORDER — IPRATROPIUM BROMIDE 0.02 % IN SOLN: 0.5000 mg | Freq: Four times a day (QID) | RESPIRATORY_TRACT | 0 refills | Status: AC

## 2018-03-01 MED ORDER — IPRATROPIUM BROMIDE 0.02 % IN SOLN: 0.5000 mg | Freq: Four times a day (QID) | RESPIRATORY_TRACT | 5 refills | Status: DC | PRN

## 2018-03-01 MED ORDER — IPRATROPIUM BROMIDE 0.02 % IN SOLN: 0.5000 mg | Freq: Four times a day (QID) | RESPIRATORY_TRACT | 11 refills | Status: DC

## 2018-03-01 MED ORDER — ALBUTEROL SULFATE HFA 108 (90 BASE) MCG/ACT IN AERS: 1.0000 | INHALATION_SPRAY | Freq: Four times a day (QID) | RESPIRATORY_TRACT | 5 refills | Status: AC | PRN

## 2018-03-01 NOTE — Telephone Encounter (Signed)
Can hold off for now and reassess at next visit.

## 2018-03-01 NOTE — Telephone Encounter (Signed)
Melissa with [facility] calling to let us know patient is refusing her OnO  Patient told her that she had it done in hospital and did not feel like she needed a repeat   FYI

## 2018-03-01 NOTE — Addendum Note (Signed)
Addended by: Stephanie Coup on: 03/01/2018 03:33 PM   Modules accepted: Orders

## 2018-03-01 NOTE — Telephone Encounter (Signed)
FYI below in regards to the ONO. Don't see an ONO from hospital.

## 2018-03-09 ENCOUNTER — Telehealth: Payer: Self-pay | Admitting: Cardiovascular Disease

## 2018-03-09 NOTE — Telephone Encounter (Signed)
[name] with [facility] calling needing to know when patient needs INR checked next She states patient has been in and out of hospital   Would like a call back on this  Please call

## 2018-03-09 NOTE — Telephone Encounter (Signed)
Unable to leave message for [facility] as VM box not set up. Will need to obtain INR at next visit as she is overdue.

## 2018-03-09 NOTE — Telephone Encounter (Signed)
Number provided in chart is not [place].   Called [place] office and spoke with Becky Sax and she will have INR done on Monday 03/15/18 and called to our [place] office.   Will route to [place] as FYI.

## 2018-03-15 ENCOUNTER — Ambulatory Visit: Payer: Self-pay

## 2018-03-15 DIAGNOSIS — Z953 Presence of xenogenic heart valve: Secondary | ICD-10-CM

## 2018-03-15 DIAGNOSIS — I4891 Unspecified atrial fibrillation: Secondary | ICD-10-CM

## 2018-03-15 DIAGNOSIS — Z5181 Encounter for therapeutic drug level monitoring: Secondary | ICD-10-CM

## 2018-03-15 LAB — POCT INR: INR: 4.4

## 2018-03-15 NOTE — Patient Instructions (Signed)
Spoke w/ Becky Sax, [facility] RN w/ [facility]. Advised her to have pt hold coumadin today and tomorrow, and then continue taking  1/2 pill daily except 1 pill on Tuesdays and Saturdays. Recheck in 1 week.

## 2018-03-24 ENCOUNTER — Ambulatory Visit (INDEPENDENT_AMBULATORY_CARE_PROVIDER_SITE_OTHER): Payer: Medicare Other

## 2018-03-24 DIAGNOSIS — I4891 Unspecified atrial fibrillation: Secondary | ICD-10-CM

## 2018-03-24 DIAGNOSIS — Z5181 Encounter for therapeutic drug level monitoring: Secondary | ICD-10-CM | POA: Diagnosis not present

## 2018-03-24 DIAGNOSIS — Z953 Presence of xenogenic heart valve: Secondary | ICD-10-CM

## 2018-03-24 LAB — POCT INR
INR: 2.7
INR: 2.7

## 2018-03-24 NOTE — Patient Instructions (Signed)
Spoke w/ Becky Sax, [facility] RN w/ [facility]. Advised her to have pt continue taking  1/2 pill daily except 1 pill on Tuesdays and Saturdays. Recheck in 1 week.

## 2018-03-25 ENCOUNTER — Telehealth: Payer: Self-pay

## 2018-03-25 NOTE — Telephone Encounter (Signed)
Attempted to schedule patient with palliative care. Line busy

## 2018-03-30 ENCOUNTER — Telehealth: Payer: Self-pay | Admitting: Cardiovascular Disease

## 2018-03-30 NOTE — Telephone Encounter (Signed)
Need to clarify how the patient is taking her potassium. She was hospitalized in April and her discharge instructions stated that she needs to take:  potassium 20 meq - take 2 tablets (40 meq) twice daily.  Please call to confirm with the patient how she is taking this.  Thanks!

## 2018-03-30 NOTE — Telephone Encounter (Signed)
°*  STAT* If patient is at the pharmacy, call can be transferred to refill team.   1. Which medications need to be refilled? (please list name of each medication and dose if known) Potassium 20 meq PO qhs   2. Which pharmacy/location (including street and city if local pharmacy) is medication to be sent to? [facility]  3. Do they need a 30 day or 90 day supply? [place]

## 2018-03-30 NOTE — Telephone Encounter (Signed)
Please advise if ok to refill. 

## 2018-03-31 ENCOUNTER — Ambulatory Visit (INDEPENDENT_AMBULATORY_CARE_PROVIDER_SITE_OTHER): Payer: Medicare Other

## 2018-03-31 DIAGNOSIS — Z953 Presence of xenogenic heart valve: Secondary | ICD-10-CM | POA: Diagnosis not present

## 2018-03-31 DIAGNOSIS — I4891 Unspecified atrial fibrillation: Secondary | ICD-10-CM | POA: Diagnosis not present

## 2018-03-31 DIAGNOSIS — Z5181 Encounter for therapeutic drug level monitoring: Secondary | ICD-10-CM

## 2018-03-31 LAB — POCT INR: INR: 6

## 2018-03-31 NOTE — Patient Instructions (Signed)
Spoke w/ Becky Sax, [facility] RN w/ [facility]. Advised her to have pt hold coumadin through Saturday and on Sunday resume taking  1/2 pill daily except 1 pill on Tuesdays and Saturdays. Have a large serving of greens today.  Recheck in 1 week.

## 2018-04-01 IMAGING — DX DG KNEE 1-2V*L*
2 series · 2 of 2 positions shown · non-contrast
Comparison: None.

CLINICAL DATA: Knee swelling

EXAM:
LEFT KNEE - 1-2 VIEW

[knee ap]
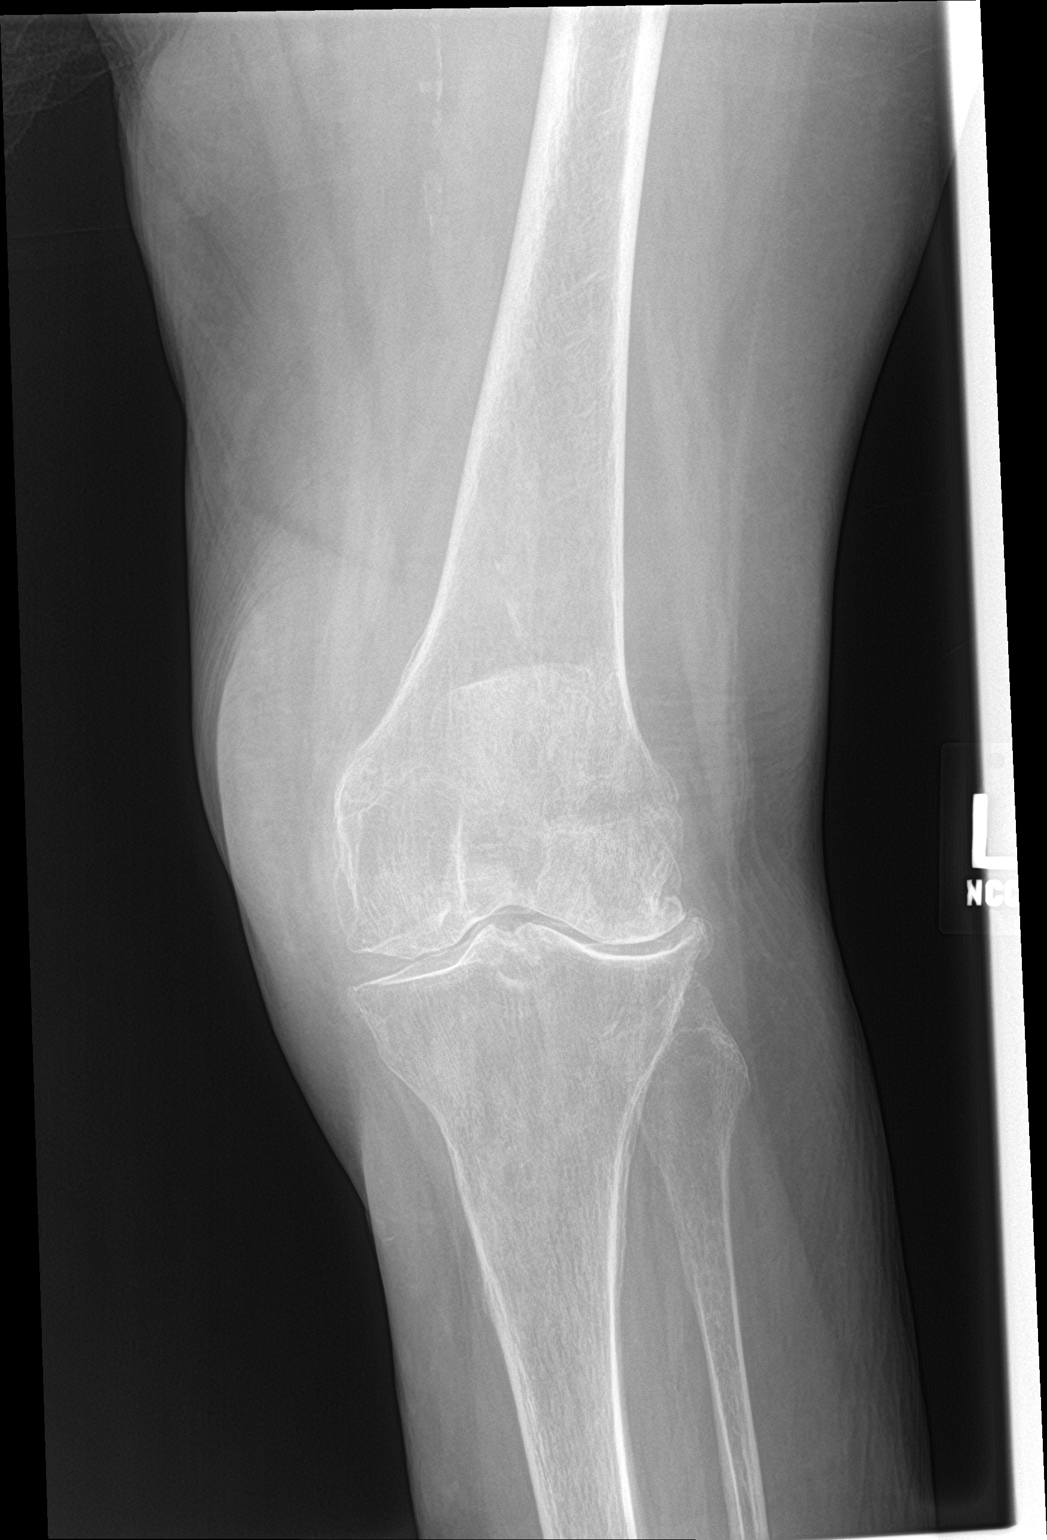

[knee lat]
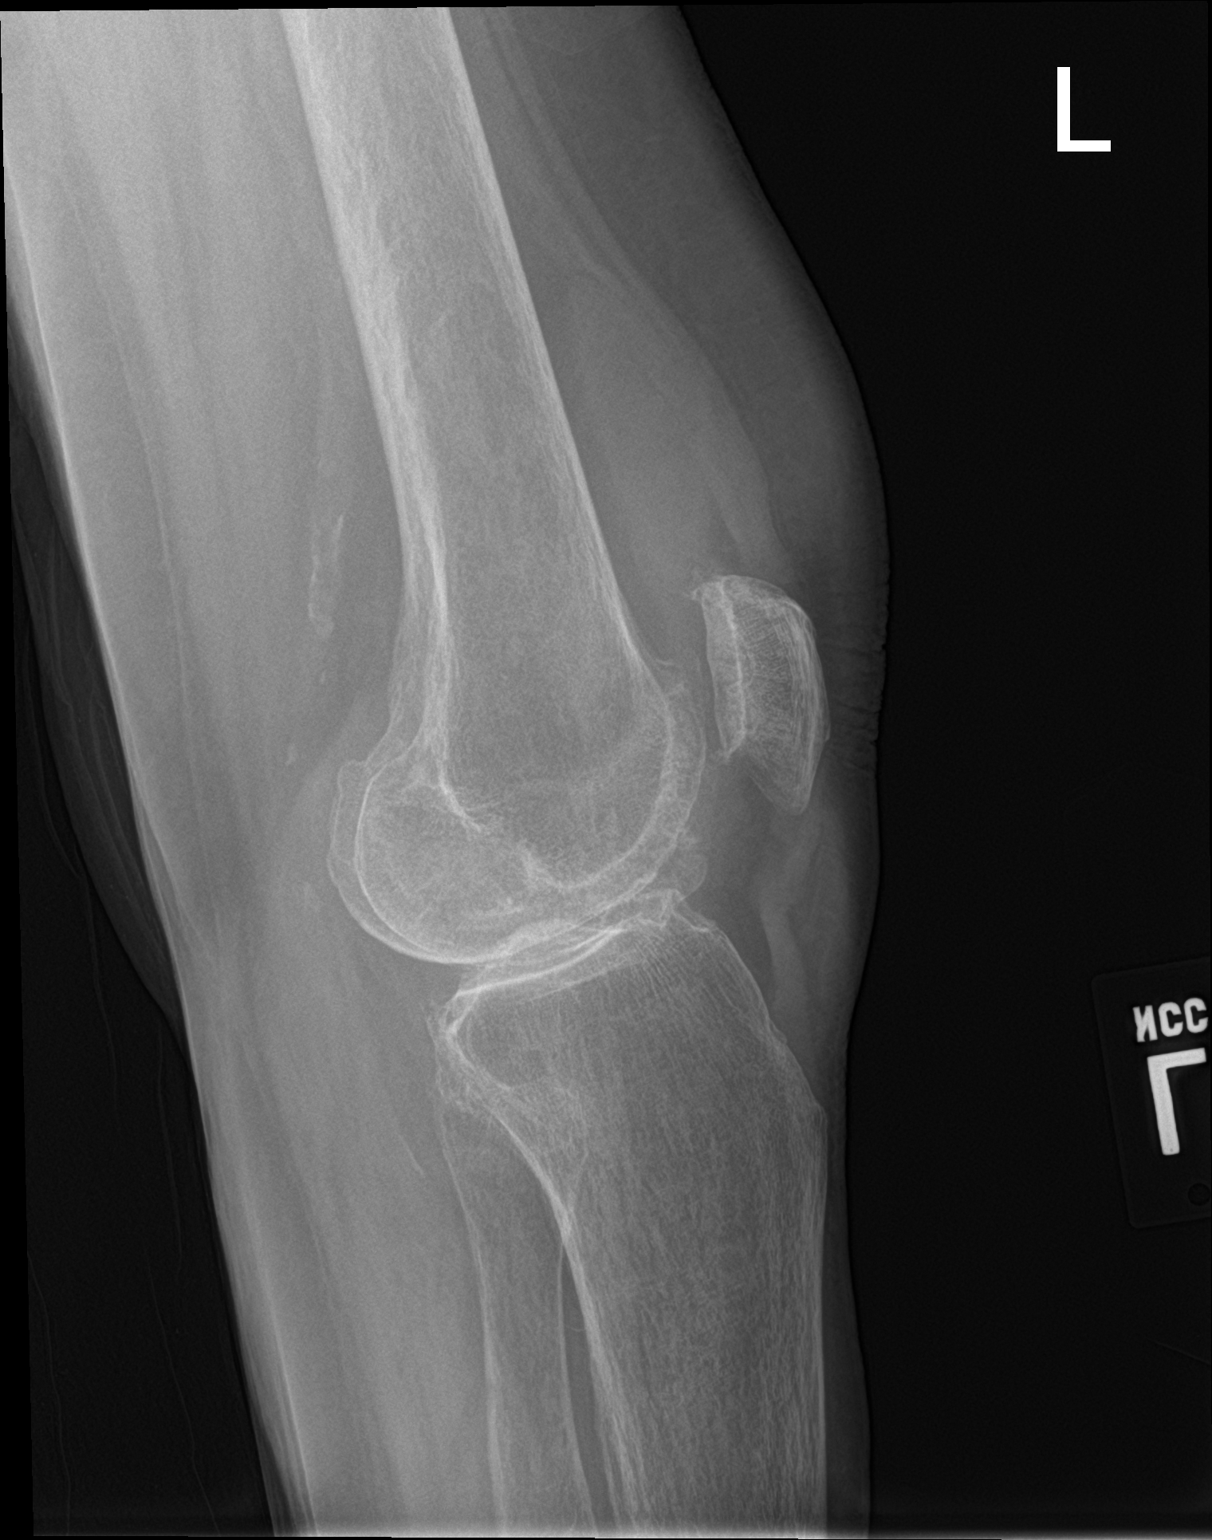

[2 of 2 positions shown; findings below may reference images not displayed]

FINDINGS: Osteopenia. Severe degenerative change. Large suprapatellar joint
effusion. Vascular calcifications. No definite acute fracture.
IMPRESSION: Degenerative change and joint effusion.

## 2018-04-01 MED ORDER — POTASSIUM CHLORIDE CRYS ER 20 MEQ PO TBCR: 20.0000 meq | EXTENDED_RELEASE_TABLET | Freq: Every day | ORAL | 6 refills | Status: AC

## 2018-04-01 NOTE — Telephone Encounter (Signed)
I spoke with Pt's niece [name] mentioned that she is completely out at this time. She is taking her potassium (1) 20 meq tablet daily.

## 2018-04-01 NOTE — Telephone Encounter (Signed)
I called and spoke with the patient's niece, Maudie Mercury to clarify if the patient has had a repeat BMP since she was discharged back in April. Per Maudie Mercury, the patient has not seen any providers since discharge as she is not able to get out very much. She is currently receiving home health. I advised Maudie Mercury that the patient's discharge summary from 02/26/18 states the patient is taking potassium 20 meq- 2 tablets (40 meq) BID.  Per Maudie Mercury, this was a short term adjustment. I advised Maudie Mercury I will refill potassium for 20 meq once daily. I will call Marijean Heath, RN with [facility] and see if she can draw a BMP with her next visit.   Per [place] # is [phone]. I spoke with Becky Sax and she will draw a BMP on 04/07/18 when she sees the patient back.  She also inquired if a CBC could be drawn. INR's have been flucuating but she had an INR of 6.1 yesterday. Hbg- 02/26/18 was 13. I advised Becky Sax we can also order a CBC as well.

## 2018-04-05 ENCOUNTER — Telehealth: Payer: Self-pay | Admitting: Cardiovascular Disease

## 2018-04-05 NOTE — Telephone Encounter (Signed)
Nurse with [facility] states pt is SOB since this past weekend. States she has bronchi in bilateral lower bases of her lungs, states she has increased weakness and unable to be weighed. States she thinks it is fluid overload. She asks possible increase in Torsemide. Please call and address.

## 2018-04-05 NOTE — Telephone Encounter (Signed)
Cant tell from the notes Is she taking torsemide 20 daily? Does she have any visit with any provider coming up? Needs one with [place] or pulmonary  Would increase torsemide up to 20 BID 8 am and 2 pm for now Call back at end of the week and let us know about sx,

## 2018-04-05 NOTE — Telephone Encounter (Signed)
Dr. Rockey Situ,   Please see message below from [facility] nurse.  I had called the patient's niece last week to verify her potassium dose for a refill, her niece stated she is not able to get out now. She was discharged on 4/12 with instructions to take potassium 20 meq BID, but has only been taking this once daily.  Per her niece, the increased dose was just short term, however, she has not had repeat BMPdone and her K+'s were running low during her hospitalization (3.2, up to 3.6 at discharge). I spoke with the [facility] nurse last week and she was going to check a BMP on Wednesday this week when her INR is rechecked.  She was 6.1 last week.  Please advise on torsemide dosing for current symptoms.

## 2018-04-06 MED ORDER — TORSEMIDE 20 MG PO TABS: 20.0000 mg | ORAL_TABLET | Freq: Two times a day (BID) | ORAL | 1 refills | Status: DC

## 2018-04-06 NOTE — Telephone Encounter (Signed)
Spoke with patients niece per release form and reviewed recommendations by Dr. Rockey Situ to increase torsemide dose and to call back at the end of the week to see how she is doing. Reviewed that she has upcoming appointments but that if needed we could move those up but Alleen Borne did mention that she was not able to get out at this time. Discussed that Alleen Borne would be out tomorrow to see patient and draw some labs and check in. She verbalized understanding of our conversation, agreement with plan, and had no further questions at this time.

## 2018-04-06 NOTE — Telephone Encounter (Signed)
Spoke with [facility] nurse and reviewed Dr. Donivan Scull recommendations. She reports that patient currently takes torsemide 20 mg once daily. Reviewed that Dr. Rockey Situ advised to try torsemide 20 mg twice a day at 8 am and then again at 2 pm. Also advised that he would like a call back to see how she is doing at the end of the week. She verbalized understanding and reports that patient should be hospice but that they are not there yet. Patient does have upcoming appointments but she reports that the patient is not able to get out at this time. Nurse reports that they will draw labs when they go out to see her tomorrow. She verbalized understanding of our conversation, agreement with plan, and had no further questions at this time.

## 2018-04-07 ENCOUNTER — Ambulatory Visit (INDEPENDENT_AMBULATORY_CARE_PROVIDER_SITE_OTHER): Payer: Medicare Other

## 2018-04-07 ENCOUNTER — Other Ambulatory Visit: Payer: Self-pay | Admitting: Cardiovascular Disease

## 2018-04-07 DIAGNOSIS — Z5181 Encounter for therapeutic drug level monitoring: Secondary | ICD-10-CM

## 2018-04-07 DIAGNOSIS — I4891 Unspecified atrial fibrillation: Secondary | ICD-10-CM

## 2018-04-07 DIAGNOSIS — Z953 Presence of xenogenic heart valve: Secondary | ICD-10-CM

## 2018-04-07 DIAGNOSIS — J449 Chronic obstructive pulmonary disease, unspecified: Secondary | ICD-10-CM

## 2018-04-07 LAB — POCT INR: INR: 2.4 (ref 2.0–3.0)

## 2018-04-07 NOTE — Patient Instructions (Signed)
Spoke w/ Becky Sax, [facility] RN w/ [facility]. Advised her to have pt continue taking 1/2 pill daily except 1 pill on Tuesdays and Saturdays. Recheck in 1 week.

## 2018-04-13 ENCOUNTER — Ambulatory Visit (INDEPENDENT_AMBULATORY_CARE_PROVIDER_SITE_OTHER): Payer: Medicare Other | Admitting: *Deleted

## 2018-04-13 ENCOUNTER — Telehealth: Payer: Self-pay

## 2018-04-13 DIAGNOSIS — I495 Sick sinus syndrome: Secondary | ICD-10-CM

## 2018-04-13 NOTE — Telephone Encounter (Signed)
Confirmed remote transmission w/ pt niece Maudie Mercury.

## 2018-04-14 ENCOUNTER — Ambulatory Visit (INDEPENDENT_AMBULATORY_CARE_PROVIDER_SITE_OTHER): Payer: Medicare Other

## 2018-04-14 DIAGNOSIS — Z953 Presence of xenogenic heart valve: Secondary | ICD-10-CM | POA: Diagnosis not present

## 2018-04-14 DIAGNOSIS — Z5181 Encounter for therapeutic drug level monitoring: Secondary | ICD-10-CM

## 2018-04-14 DIAGNOSIS — I4891 Unspecified atrial fibrillation: Secondary | ICD-10-CM | POA: Diagnosis not present

## 2018-04-14 LAB — POCT INR
INR: 8 — AB (ref 2.0–3.0)
INR: 10 — AB (ref 2.0–3.0)

## 2018-04-14 NOTE — Patient Instructions (Addendum)
Spoke w/ Becky Sax, [facility] RN w/ [facility]. Advised her to draw STAT INR via venipuncture and send results to our office, and HOLD COUMADIN until we call back w/ further instructions.  She inquires how much longer pt will need to do this since her health is declining.  Spoke w/ Tana Coast, pharmacist and Dr. Saunders Revel. Per Dr. Saunders Revel, ok to d/c coumadin. Spoke w/ Maudie Mercury, pt's niece and made her aware, she verbalizes understanding and is agreeable, w/ understanding that if pt's health improves and she wishes to resume coumadin, we can do this.  Spoke w/ Becky Sax, [facility] RN w/ [facility] and gave verbal order to d/c coumadin & INR checks.

## 2018-04-14 NOTE — Progress Notes (Signed)
Remote pacemaker transmission.   

## 2018-04-15 LAB — CUP PACEART REMOTE DEVICE CHECK
Battery Remaining Longevity: 93 mo
Battery Voltage: 2.77 V
Implantable Lead Implant Date: 20081003
Implantable Lead Location: 753860
Implantable Pulse Generator Implant Date: 20171004
Lead Channel Impedance Value: 563 Ohm
Lead Channel Pacing Threshold Pulse Width: 0.4 ms
Lead Channel Setting Pacing Amplitude: 2.5 V
Lead Channel Setting Sensing Sensitivity: 5.6 mV
MDC IDC MSMT BATTERY IMPEDANCE: 251 Ohm
MDC IDC MSMT LEADCHNL RA IMPEDANCE VALUE: 0 Ohm
MDC IDC MSMT LEADCHNL RV PACING THRESHOLD AMPLITUDE: 0.75 V
MDC IDC SESS DTM: 20190528191050
MDC IDC SET LEADCHNL RV PACING PULSEWIDTH: 0.4 ms
MDC IDC STAT BRADY RV PERCENT PACED: 99 %

## 2018-04-16 ENCOUNTER — Encounter: Payer: Self-pay | Admitting: Cardiology

## 2018-04-19 ENCOUNTER — Telehealth: Payer: Self-pay

## 2018-04-19 NOTE — Telephone Encounter (Signed)
Received phone call from patient's niece to schedule a visit with Palliative Care. Scheduled a visit with NP for 04/20/18.

## 2018-04-20 ENCOUNTER — Other Ambulatory Visit: Payer: Medicare Other | Admitting: Hospice and Palliative Medicine

## 2018-04-20 DIAGNOSIS — Z515 Encounter for palliative care: Secondary | ICD-10-CM

## 2018-04-21 NOTE — Progress Notes (Signed)
PALLIATIVE CARE CONSULT VISIT   PATIENT NAME: [name] DOB: [birth year] MRN: [id number]  PRIMARY CARE PROVIDER:   Jodi Marble, MD  REFERRING PROVIDER:      Jodi Marble, MD [place], [place] [address]  RESPONSIBLE PARTY:   Niece, Kim  ASSESSMENT:     Family report that patient has declined significantly over the past few months. She last walked in February and has since been chair bound. However, she has been in a bed over the past several weeks and is fully dependent upon family for all care. Family has hired caregivers to come daily to provide bathing and dressing. She was also being followed by home health but this has stopped. Patient is reportedly developing bed sores (stage 1 on sacrum). Her oral intake is poor - reportedly less than 25% and her clothes are looser.   I discussed option of hospice with patient and family. They were in agreement. Information was given to hospice referral team.   Patient says she does not want to be resuscitated. Family agrees. I completed a DNR form today.   RECOMMENDATIONS and PLAN:  1. Hospice referral 2. DNR  I spent 45 minutes providing this consultation,  from 0900 to 0945. More than 50% of the time in this consultation was spent coordinating communication.   HISTORY OF PRESENT ILLNESS:  [name] is a 84 y.o. year old female with multiple medical problems including dCHF, SSS s/p PPM, HTN, HLD, pulmonary HTN, chronic respiratory failure on O2, afib on Coumadin, AAA s/p repair, dysphgia, who was last hospitalized in 02/2018 for CHF. She has had a progressive functional decline over the past few months and palliative care was asked to help address goals of care.   CODE STATUS: DNR  PPS: 30% HOSPICE ELIGIBILITY/DIAGNOSIS: YES  PAST MEDICAL HISTORY:  Past Medical History:  Diagnosis Date  . AAA (abdominal aortic aneurysm) ([place])    a. 12/2011 U/S: 3.3 cm prox AAA.  [name] Anxiety   . Aortic insufficiency     a. S/P bioprosthetic AVR;  b. 02/2013 Echo: EF 50-55%, mild to mod AI (mean grad 32mmHg).  . Brain aneurysm    Hx of  . Chest pain    a. H/o nonobs cath;  b. 07/2013 Lexiscan MV: EF 66%, no ischemia.  . Chronic diastolic CHF (congestive heart failure) ([place])    a. 02/2013 Echo: EF 50-55%.  . Diverticulosis   . Gastritis   . Hemorrhoids   . Hepatitis B 1980's  . Hiatal hernia   . Hypertension   . Moderate mitral regurgitation    a. 02/2013 Echo: Mod MR.  . Moderate tricuspid regurgitation    a. 02/2013 Echo: Mod TR.  [name] Permanent atrial fibrillation ([place])    a. Chronic coumadin.  . Polymyalgia rheumatica ([place])   . Pulmonary hypertension ([place])    a. 02/2013 Echo: PASP 17mmHg.  . S/P aortic valve replacement with bioprosthetic valve    a. [facility].  . S/P placement of cardiac pacemaker    a.  08/2007 s/p MDT [place] PPM.  . Seizures ([place])   . Shingles   . Skin cancer    face, ear  and left hand.  . Tachy-brady syndrome ([place])    a. Severe in the setting of permanent atrial fibrillation with tachybrady syndrome;  b. 08/2007 s/p MDT [place] PPM.  . Thoracic aneurysm    a. 10/2011 CTA: 5.0 x 3.8 cm.  SOCIAL HX:  Social History   Tobacco Use  . Smoking status: Never Smoker  . Smokeless tobacco: Never Used  Substance Use Topics  . Alcohol use: No    ALLERGIES:  Allergies  Allergen Reactions  . Advair Diskus [Fluticasone-Salmeterol] Anaphylaxis  . Furosemide Shortness Of Breath    Tolerates torsemide  . Other Shortness Of Breath and Other (See Comments)    IODINATED CONTRAST MEDIA: Chills and fever also  . Salmeterol Anaphylaxis    THROAT CLOSES with Advair; tolerates formoterol and albuterol  . Iohexol Other (See Comments)    Desc:  Pt requires a 13 hr prep, when doing exam with contrast....fever, chills, dyspnea (slg/04/27/09--given 2 hour solumedrol/ benadryl prep--no reaction).    . Sulfonamide Derivatives Nausea And Vomiting  . Verapamil  Hives, Nausea And Vomiting and Other (See Comments)    Loss of appetite also   . Adhesive [Tape] Other (See Comments)    TEARS and BRUISES SKIN  . Amoxicillin-Pot Clavulanate Nausea Only and Other (See Comments)    NAUSEA AND STOMACH PAIN  . Contrast Media [Iodinated Diagnostic Agents] Other (See Comments)    "Froze" and fever  . Ibuprofen Other (See Comments)    Has liver damage  . Levofloxacin Nausea Only and Other (See Comments)    NAUSEA AND STOMACH PAIN  . Macrobid [Nitrofurantoin Monohyd Macro] Nausea Only and Other (See Comments)    Severe stomach pain  . Latex Rash  . Penicillins Rash and Other (See Comments)    Has patient had a PCN reaction causing immediate rash, facial/tongue/throat swelling, SOB or lightheadedness with hypotension: Yes Has patient had a PCN reaction causing severe rash involving mucus membranes or skin necrosis: Unk Has patient had a PCN reaction that required hospitalization: Unk Has patient had a PCN reaction occurring within the last 10 years: No If all of the above answers are "NO", then may proceed with Cephalosporin use.      PERTINENT MEDICATIONS:  Outpatient Encounter Medications as of 04/20/2018  Medication Sig  . acetaminophen (TYLENOL) 500 MG tablet Take 1 tablet (500 mg total) by mouth every 6 (six) hours. (Patient taking differently: Take 500 mg by mouth every 6 (six) hours as needed for headache (pain). )  . albuterol (PROVENTIL HFA;VENTOLIN HFA) 108 (90 Base) MCG/ACT inhaler Inhale 1 puff into the lungs every 6 (six) hours as needed for wheezing or shortness of breath.  [name] albuterol (PROVENTIL) (2.5 MG/3ML) 0.083% nebulizer solution TAKE 3 MLS BY NEBULIZER EVERY 6HRS AS NEEDED FOR WHEEZE OR SHORT OF BREATH. J44.9 NEED PART B INFO  . albuterol (PROVENTIL) (2.5 MG/3ML) 0.083% nebulizer solution Take 3 mLs (2.5 mg total) by nebulization every 6 (six) hours. DX:J44.9  . AMBULATORY NON FORMULARY MEDICATION Medication Name:  AeroChamber DME:[facility] DX:J98.11  . bacitracin ointment Apply 1 application topically 2 (two) times daily. Apply to skin tears, then place a non-stick dressing (Patient not taking: Reported on 02/20/2018)  . Brinzolamide-Brimonidine (SIMBRINZA) 1-0.2 % SUSP Place 1 drop into both eyes daily.   . budesonide-formoterol (SYMBICORT) 160-4.5 MCG/ACT inhaler Inhale 1 puff into the lungs 2 (two) times daily.  . diclofenac sodium (VOLTAREN) 1 % GEL Apply 4 g topically See admin instructions. Apply 4 g topically to both knees two times a day  . flintstones complete (FLINTSTONES) 60 MG chewable tablet Chew 1 tablet by mouth daily.  [name] guaiFENesin (MUCINEX) 600 MG 12 hr tablet Take 1 tablet (600 mg total) by mouth 2 (two) times daily.  [name] ipratropium (ATROVENT)  0.02 % nebulizer solution Take 2.5 mLs (0.5 mg total) by nebulization every 6 (six) hours. Dx:J44.9  . latanoprost (XALATAN) 0.005 % ophthalmic solution Place 1 drop into both eyes at bedtime.  [name] LORazepam (ATIVAN) 0.5 MG tablet Take 1 tablet (0.5 mg total) by mouth every 8 (eight) hours as needed for anxiety. (Patient taking differently: Take 0.5 mg by mouth 3 (three) times daily. )  . meclizine (ANTIVERT) 25 MG tablet Take 12.5 mg by mouth 3 (three) times daily as needed for dizziness.  . pantoprazole (PROTONIX) 40 MG tablet Take 40 mg by mouth daily.   [name] PARoxetine (PAXIL) 10 MG tablet Take 10 mg by mouth daily.  . polyethylene glycol (MIRALAX / GLYCOLAX) packet Take 17 g by mouth daily as needed for moderate constipation. (Patient taking differently: Take 17 g by mouth daily. Mix in 8 oz liquid and drink)  . potassium chloride SA (K-DUR,KLOR-CON) 20 MEQ tablet Take 1 tablet (20 mEq total) by mouth daily.  . predniSONE (DELTASONE) 20 MG tablet Take 2 tablets (40 mg total) by mouth daily before breakfast.  . torsemide (DEMADEX) 20 MG tablet Take 1 tablet (20 mg total) by mouth 2 (two) times daily.  . traMADol (ULTRAM) 50 MG tablet Take 1 tablet (50 mg  total) by mouth every 6 (six) hours as needed for moderate pain. (Patient taking differently: Take 25 mg by mouth at bedtime. )  . traZODone (DESYREL) 50 MG tablet Take 75 mg by mouth at bedtime.   [name] warfarin (COUMADIN) 5 MG tablet TAKE AS DIRECTED BY THE COUMADIN CLINIC   No facility-administered encounter medications on file as of 04/20/2018.     PHYSICAL EXAM:   General: NAD, frail appearing, thin, lying in bed Cardiovascular: irregular Pulmonary: clear ant fields, on O2 Abdomen: soft, nontender, + bowel sounds GU: no suprapubic tenderness Extremities: no edema, no joint deformities Skin: no rashes, redness reported but not visualized Neurological: Weakness but otherwise nonfocal  Irean Hong, NP

## 2018-05-05 NOTE — Progress Notes (Deleted)
[facility]     Assessment and Plan:  Nocturnal dyspnea, chronic pulmonary edema-pleural effusions secondary to volume overload.. -Suspect multifactorial from fluid shift, PND, pleural effusion, GERD, postnasal drip. - I have asked the patient's niece to get her a wedge to sleep on which may help reduce the fluid shift.  -We will start nasal steroid 2 sprays in each nostril at night. -Continue taking PPI.  Avoid eating 4 hours before bedtime. - Use albuterol inhaler at bedtime, her technique was watched today and she would benefit from a spacer.   Acute bronchitis.  Possible viral syndrome. - Chest x-ray showed no evidence of pneumonia.  She is prescribed a nebulizer today with duo nebs to be used 3 times daily until her breathing is feeling better, then can decrease nebulizer to as needed.  No orders of the defined types were placed in this encounter.  No orders of the defined types were placed in this encounter.  No follow-ups on file.   Date: 05/05/2018  MRN# [id number] [name] 1922-04-19   [name] is a 84 y.o. old female seen in consultation for chief complaint of:    No chief complaint on file.   HPI:   The patient is a 84 year old female with a history of diastolic dysfunction, atrial fibrillation status post pacemaker.  She has a history of bioprosthetic aortic valve repair in 2013. She is seen due to episodic dyspnea which wakes her at night.  At last visit she was asked to sleep with a wedge, and asked to take albuterol at bedtime with a spacer.   She has been on torsemide 20 mg daily, and was increased to 40 mg daily for 3 days by Dr. Rockey Situ, which did not seem to help.    She does not snore at night, she has never been diagnosed with OSA.   Imaging personally reviewed, chest x-ray 02/09/18 right base small pleural effusion, atelectasis, minimal change from previous on 04/15/17.  CT chest 04/15/18 small to moderate right-sided  pleural effusion, large aortic aneurysm with compression of surrounding lung, pulmonary edema, area rounded atelectasis versus loculated fluid in right lung posteriorly.  Medication:    Current Outpatient Medications:  .  acetaminophen (TYLENOL) 500 MG tablet, Take 1 tablet (500 mg total) by mouth every 6 (six) hours. (Patient taking differently: Take 500 mg by mouth every 6 (six) hours as needed for headache (pain). ), Disp: 30 tablet, Rfl: 0 .  albuterol (PROVENTIL HFA;VENTOLIN HFA) 108 (90 Base) MCG/ACT inhaler, Inhale 1 puff into the lungs every 6 (six) hours as needed for wheezing or shortness of breath., Disp: 360 g, Rfl: 5 .  albuterol (PROVENTIL) (2.5 MG/3ML) 0.083% nebulizer solution, TAKE 3 MLS BY NEBULIZER EVERY 6HRS AS NEEDED FOR WHEEZE OR SHORT OF BREATH. J44.9 NEED PART B INFO, Disp: 75 mL, Rfl: 0 .  albuterol (PROVENTIL) (2.5 MG/3ML) 0.083% nebulizer solution, Take 3 mLs (2.5 mg total) by nebulization every 6 (six) hours. DX:J44.9, Disp: 360 mL, Rfl: 11 .  AMBULATORY NON FORMULARY MEDICATION, Medication Name: AeroChamber DME:[facility] DX:J98.11, Disp: 1 each, Rfl: 0 .  bacitracin ointment, Apply 1 application topically 2 (two) times daily. Apply to skin tears, then place a non-stick dressing (Patient not taking: Reported on 02/20/2018), Disp: 120 g, Rfl: 0 .  Brinzolamide-Brimonidine (SIMBRINZA) 1-0.2 % SUSP, Place 1 drop into both eyes daily. , Disp: , Rfl:  .  budesonide-formoterol (SYMBICORT) 160-4.5 MCG/ACT inhaler, Inhale 1 puff into the lungs 2 (two) times  daily., Disp: , Rfl:  .  diclofenac sodium (VOLTAREN) 1 % GEL, Apply 4 g topically See admin instructions. Apply 4 g topically to both knees two times a day, Disp: , Rfl:  .  flintstones complete (FLINTSTONES) 60 MG chewable tablet, Chew 1 tablet by mouth daily., Disp: , Rfl:  .  guaiFENesin (MUCINEX) 600 MG 12 hr tablet, Take 1 tablet (600 mg total) by mouth 2 (two) times daily., Disp: 600 tablet, Rfl: 0 .  ipratropium (ATROVENT) 0.02  % nebulizer solution, Take 2.5 mLs (0.5 mg total) by nebulization every 6 (six) hours. Dx:J44.9, Disp: 360 mL, Rfl: 0 .  latanoprost (XALATAN) 0.005 % ophthalmic solution, Place 1 drop into both eyes at bedtime., Disp: , Rfl:  .  LORazepam (ATIVAN) 0.5 MG tablet, Take 1 tablet (0.5 mg total) by mouth every 8 (eight) hours as needed for anxiety. (Patient taking differently: Take 0.5 mg by mouth 3 (three) times daily. ), Disp: 12 tablet, Rfl: 0 .  meclizine (ANTIVERT) 25 MG tablet, Take 12.5 mg by mouth 3 (three) times daily as needed for dizziness., Disp: , Rfl:  .  pantoprazole (PROTONIX) 40 MG tablet, Take 40 mg by mouth daily. , Disp: , Rfl:  .  PARoxetine (PAXIL) 10 MG tablet, Take 10 mg by mouth daily., Disp: , Rfl: 1 .  polyethylene glycol (MIRALAX / GLYCOLAX) packet, Take 17 g by mouth daily as needed for moderate constipation. (Patient taking differently: Take 17 g by mouth daily. Mix in 8 oz liquid and drink), Disp: 14 each, Rfl: 0 .  potassium chloride SA (K-DUR,KLOR-CON) 20 MEQ tablet, Take 1 tablet (20 mEq total) by mouth daily., Disp: 30 tablet, Rfl: 6 .  predniSONE (DELTASONE) 20 MG tablet, Take 2 tablets (40 mg total) by mouth daily before breakfast., Disp: 20 tablet, Rfl: 0 .  torsemide (DEMADEX) 20 MG tablet, Take 1 tablet (20 mg total) by mouth 2 (two) times daily., Disp: 60 tablet, Rfl: 1 .  traMADol (ULTRAM) 50 MG tablet, Take 1 tablet (50 mg total) by mouth every 6 (six) hours as needed for moderate pain. (Patient taking differently: Take 25 mg by mouth at bedtime. ), Disp: 30 tablet, Rfl: 0 .  traZODone (DESYREL) 50 MG tablet, Take 75 mg by mouth at bedtime. , Disp: , Rfl:  .  warfarin (COUMADIN) 5 MG tablet, TAKE AS DIRECTED BY THE COUMADIN CLINIC, Disp: 30 tablet, Rfl: 3   Allergies:  Advair diskus [fluticasone-salmeterol]; Furosemide; Other; Salmeterol; Iohexol; Sulfonamide derivatives; Verapamil; Adhesive [tape]; Amoxicillin-pot clavulanate; Contrast media [iodinated  diagnostic agents]; Ibuprofen; Levofloxacin; Macrobid [nitrofurantoin monohyd macro]; Latex; and Penicillins  Review of Systems:   Physical Examination:   VS: There were no vitals taken for this visit.    Other findings:    LABORATORY PANEL:   CBC No results for input(s): WBC, HGB, HCT, PLT in the last 168 hours. ------------------------------------------------------------------------------------------------------------------  Chemistries  No results for input(s): NA, K, CL, CO2, GLUCOSE, BUN, CREATININE, CALCIUM, MG, AST, ALT, ALKPHOS, BILITOT in the last 168 hours.  Invalid input(s): GFRCGP ------------------------------------------------------------------------------------------------------------------  Cardiac Enzymes No results for input(s): TROPONINI in the last 168 hours. ------------------------------------------------------------  RADIOLOGY:  No results found.     Thank  you for the consultation and for allowing [facility] to assist in the care of your patient. Our recommendations are noted above.  Please contact us if we can be of further service.   Marda Stalker, MD.  Board Certified in Internal Medicine, Pulmonary Medicine, Critical  Care Medicine, and Sleep Medicine.  [facility] Office Number: [phone]  Patricia Pesa, M.D.  Merton Border, M.D  05/05/2018

## 2018-05-10 ENCOUNTER — Ambulatory Visit: Payer: Medicare Other | Admitting: Internal Medicine

## 2018-05-17 DEATH — deceased

## 2018-05-24 ENCOUNTER — Ambulatory Visit: Payer: Medicare Other | Admitting: Cardiovascular Disease

## 2019-01-23 IMAGING — CR DG CHEST 2V
2 series · 2 of 2 positions shown · non-contrast
Comparison: Chest x-ray 02/09/2018.

CLINICAL DATA: Shortness of breath.  Cough.

EXAM:
CHEST - 2 VIEW

[chest lat]
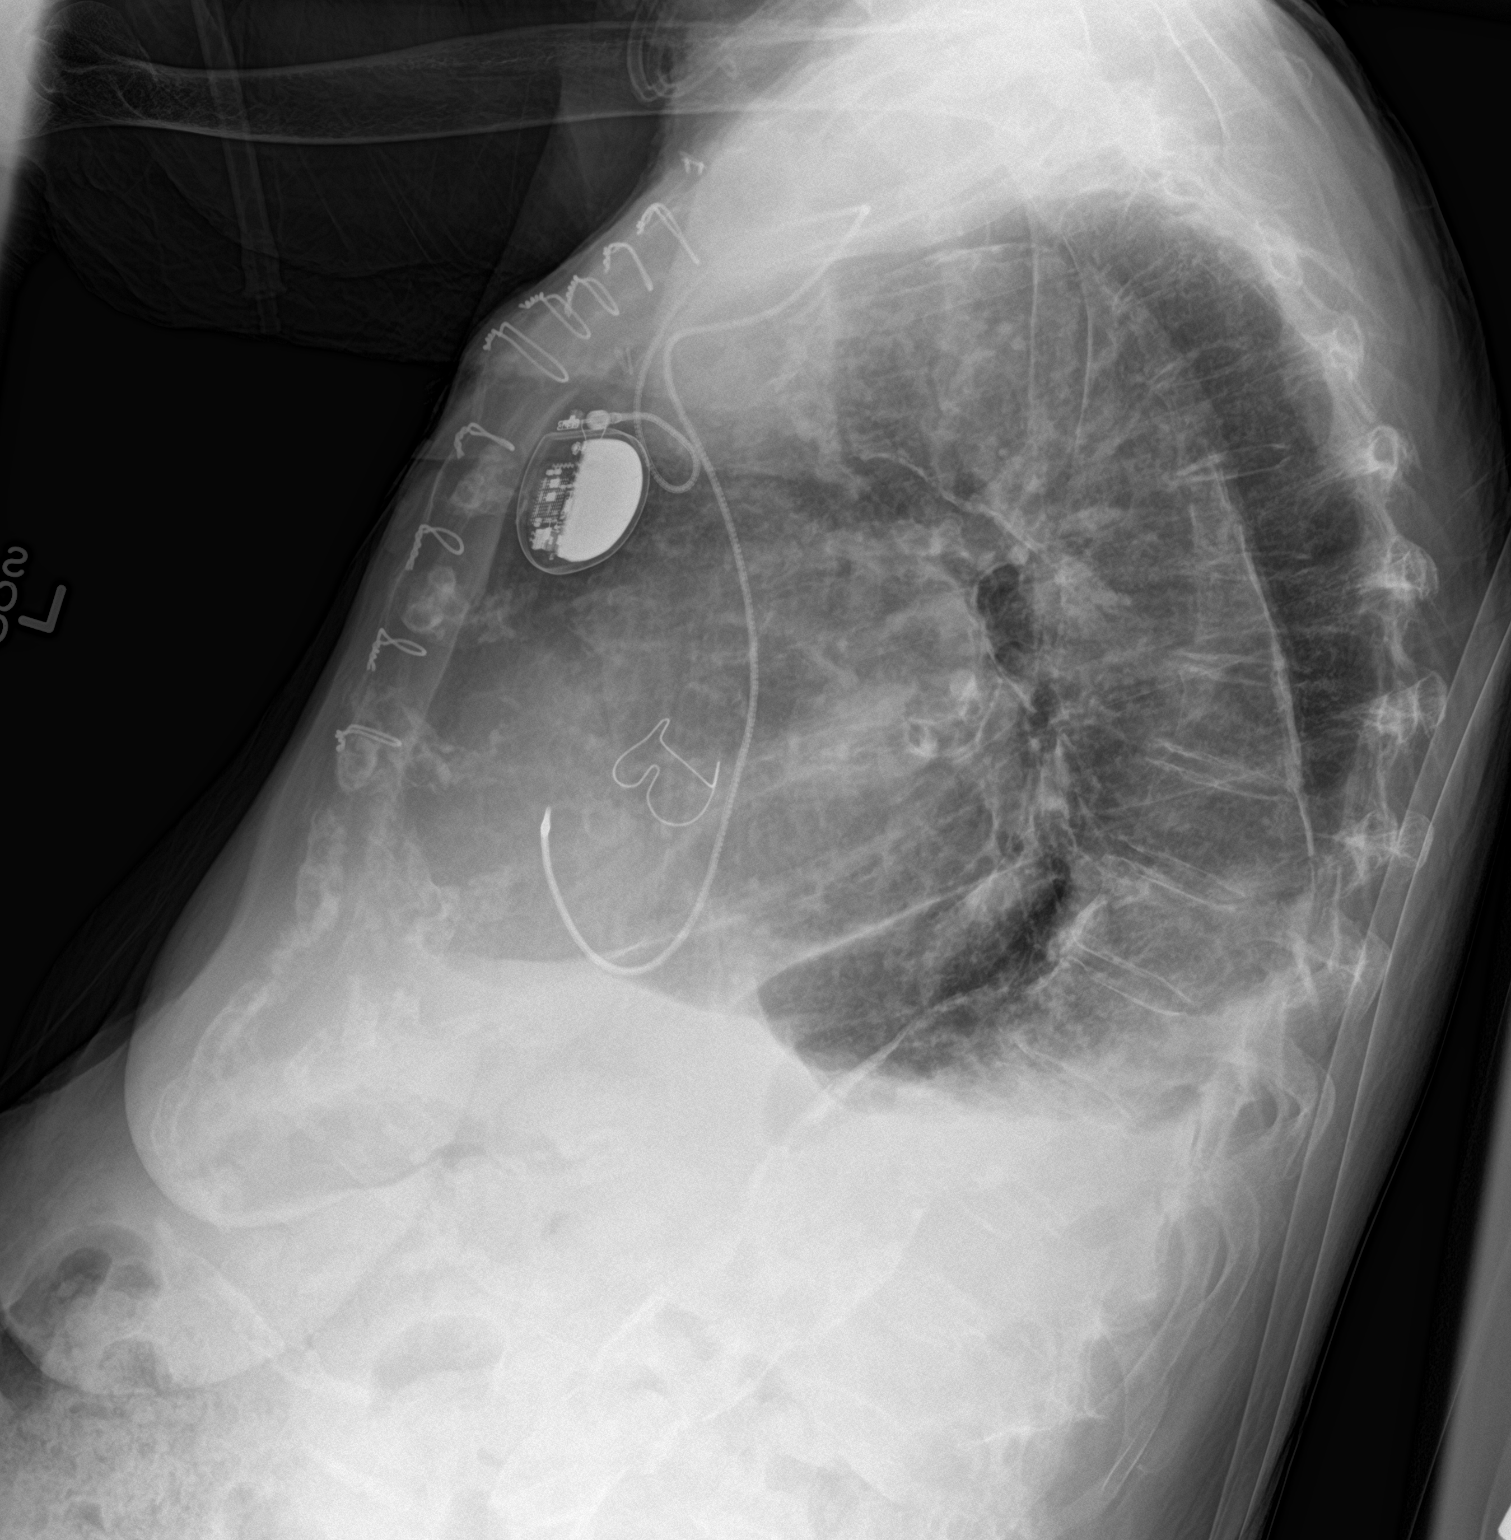

[chest ap]
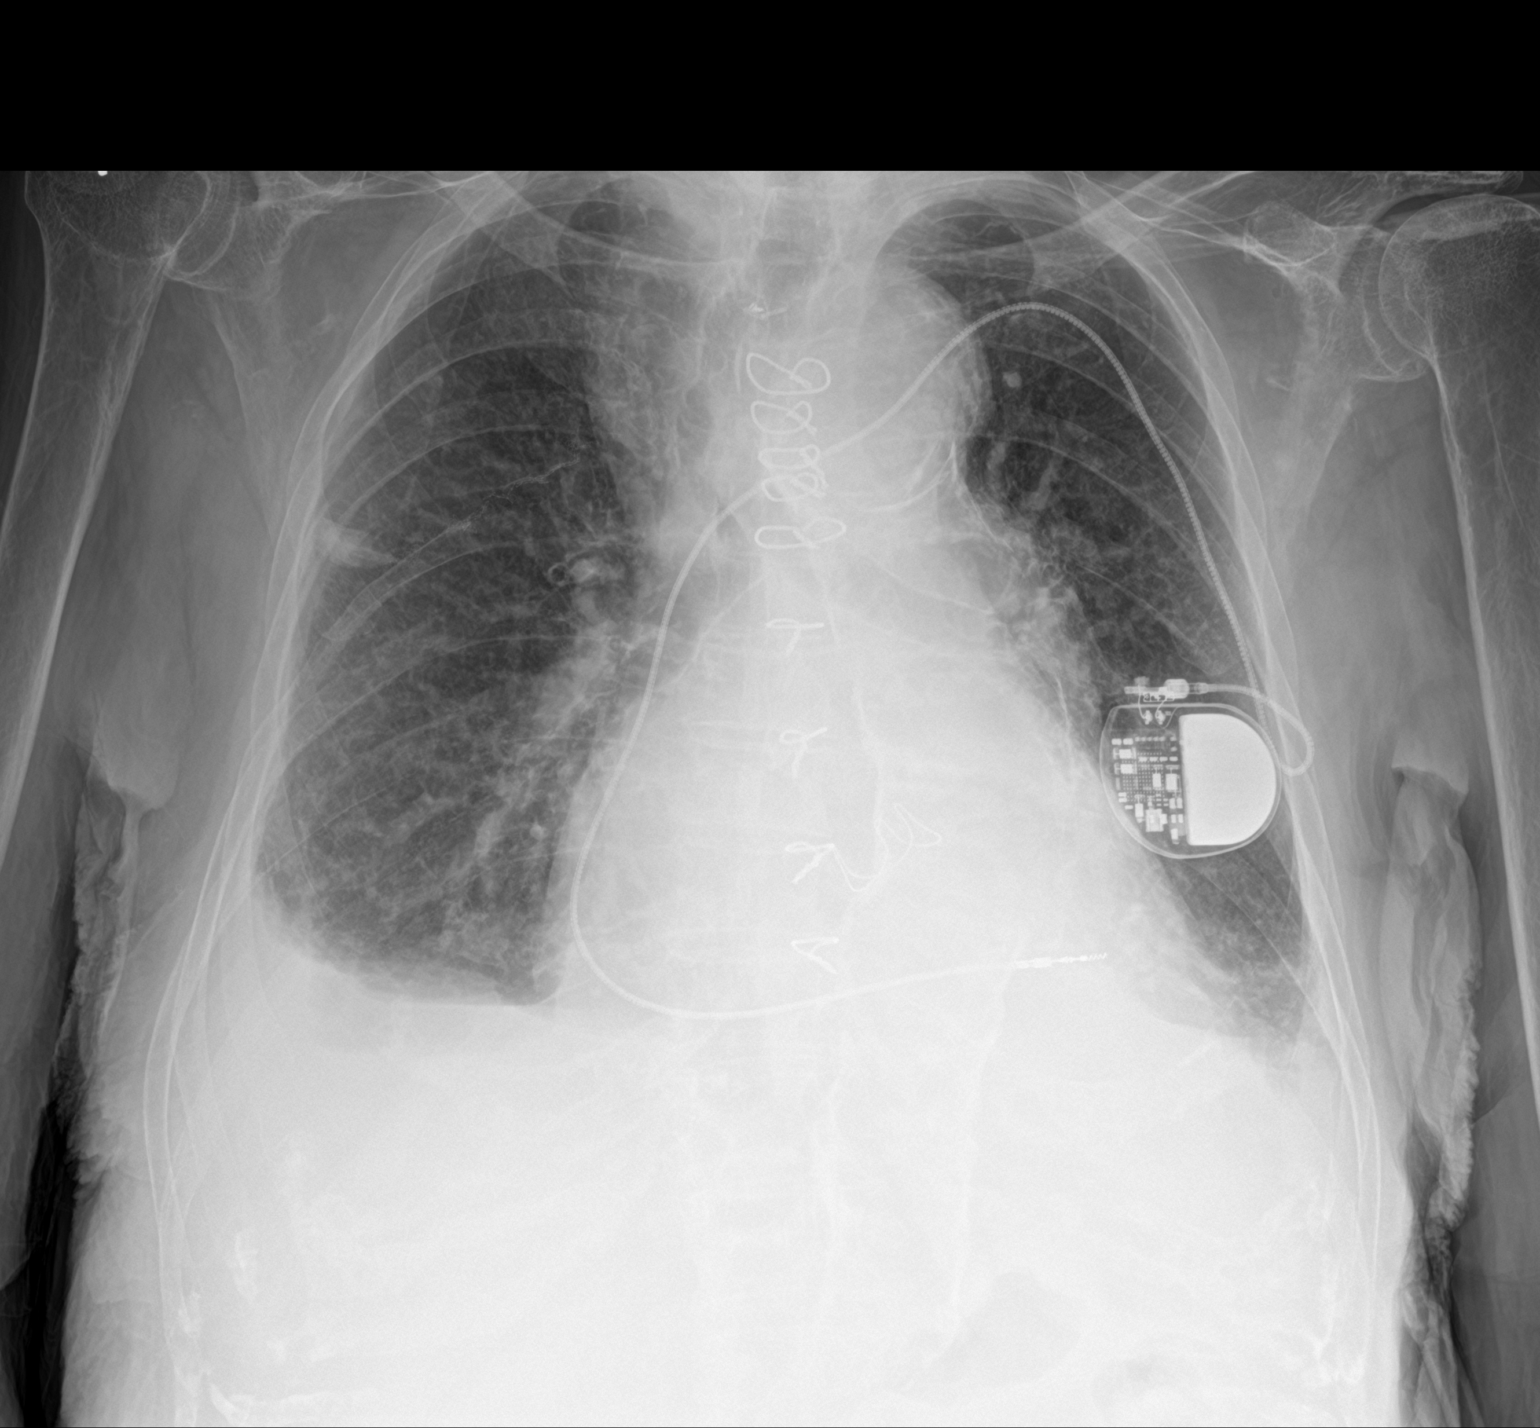

[2 of 2 positions shown; findings below may reference images not displayed]

FINDINGS: Thoracic aortic prominence again noted consistent with known
thoracic aortic aneurysm. No change from prior exam cardiac pacer
with lead tip over the right ventricle. Prior CABG. Prior cardiac
valve replacement. Bilateral mild interstitial prominence and
bilateral pleural effusions. Findings suggest mild CHF. Mild basilar
atelectasis again noted.
IMPRESSION: 1. Cardiac pacer stable position. Cardiac valve replacement.
Cardiomegaly with mild increase in bilateral interstitial prominence
and small bilateral pleural effusions again noted. Findings
consistent with CHF. Persistent mild bibasilar atelectasis.

2.  Thoracic aortic aneurysm again noted.  No interim change.
